# Patient Record
Sex: Female | Born: 1940
Health system: Southern US, Community
[De-identification: ages and names within clinical notes are randomized; demographics above are authoritative.]

## PROBLEM LIST (undated history)

## (undated) DIAGNOSIS — Z9889 Other specified postprocedural states: Secondary | ICD-10-CM

## (undated) DIAGNOSIS — M79609 Pain in unspecified limb: Secondary | ICD-10-CM

## (undated) DIAGNOSIS — R509 Fever, unspecified: Secondary | ICD-10-CM

## (undated) DIAGNOSIS — Z9089 Acquired absence of other organs: Secondary | ICD-10-CM

## (undated) DIAGNOSIS — M1990 Unspecified osteoarthritis, unspecified site: Secondary | ICD-10-CM

## (undated) DIAGNOSIS — R209 Unspecified disturbances of skin sensation: Secondary | ICD-10-CM

## (undated) DIAGNOSIS — N390 Urinary tract infection, site not specified: Secondary | ICD-10-CM

## (undated) DIAGNOSIS — R112 Nausea with vomiting, unspecified: Secondary | ICD-10-CM

## (undated) DIAGNOSIS — R0600 Dyspnea, unspecified: Secondary | ICD-10-CM

## (undated) DIAGNOSIS — R32 Unspecified urinary incontinence: Secondary | ICD-10-CM

## (undated) DIAGNOSIS — R002 Palpitations: Secondary | ICD-10-CM

## (undated) DIAGNOSIS — F419 Anxiety disorder, unspecified: Secondary | ICD-10-CM

## (undated) DIAGNOSIS — J302 Other seasonal allergic rhinitis: Secondary | ICD-10-CM

## (undated) DIAGNOSIS — M4120 Other idiopathic scoliosis, site unspecified: Secondary | ICD-10-CM

## (undated) DIAGNOSIS — T8859XA Other complications of anesthesia, initial encounter: Secondary | ICD-10-CM

## (undated) DIAGNOSIS — Z860100 Personal history of colon polyps, unspecified: Secondary | ICD-10-CM

## (undated) DIAGNOSIS — R109 Unspecified abdominal pain: Secondary | ICD-10-CM

## (undated) DIAGNOSIS — T50995A Adverse effect of other drugs, medicaments and biological substances, initial encounter: Secondary | ICD-10-CM

## (undated) DIAGNOSIS — Z87442 Personal history of urinary calculi: Secondary | ICD-10-CM

## (undated) DIAGNOSIS — K5730 Diverticulosis of large intestine without perforation or abscess without bleeding: Secondary | ICD-10-CM

## (undated) DIAGNOSIS — E213 Hyperparathyroidism, unspecified: Secondary | ICD-10-CM

## (undated) DIAGNOSIS — N189 Chronic kidney disease, unspecified: Secondary | ICD-10-CM

## (undated) DIAGNOSIS — E785 Hyperlipidemia, unspecified: Secondary | ICD-10-CM

## (undated) DIAGNOSIS — I10 Essential (primary) hypertension: Secondary | ICD-10-CM

## (undated) DIAGNOSIS — R5381 Other malaise: Secondary | ICD-10-CM

## (undated) DIAGNOSIS — K589 Irritable bowel syndrome without diarrhea: Secondary | ICD-10-CM

## (undated) DIAGNOSIS — M810 Age-related osteoporosis without current pathological fracture: Secondary | ICD-10-CM

## (undated) DIAGNOSIS — Z803 Family history of malignant neoplasm of breast: Secondary | ICD-10-CM

## (undated) DIAGNOSIS — D649 Anemia, unspecified: Secondary | ICD-10-CM

## (undated) DIAGNOSIS — Z833 Family history of diabetes mellitus: Secondary | ICD-10-CM

## (undated) DIAGNOSIS — F0390 Unspecified dementia without behavioral disturbance: Secondary | ICD-10-CM

## (undated) DIAGNOSIS — R011 Cardiac murmur, unspecified: Secondary | ICD-10-CM

## (undated) DIAGNOSIS — K5900 Constipation, unspecified: Secondary | ICD-10-CM

## (undated) DIAGNOSIS — R079 Chest pain, unspecified: Secondary | ICD-10-CM

## (undated) DIAGNOSIS — R5383 Other fatigue: Secondary | ICD-10-CM

## (undated) HISTORY — PX: CATARACT EXTRACTION, BILATERAL: SHX1313

## (undated) HISTORY — DX: Chest pain, unspecified: R07.9

## (undated) HISTORY — PX: TONSILLECTOMY: SUR1361

## (undated) HISTORY — DX: Hyperlipidemia, unspecified: E78.5

## (undated) HISTORY — PX: POLYPECTOMY: SHX149

## (undated) HISTORY — DX: Fever, unspecified: R50.9

## (undated) HISTORY — DX: Other fatigue: R53.83

## (undated) HISTORY — DX: Family history of diabetes mellitus: Z83.3

## (undated) HISTORY — DX: Other idiopathic scoliosis, site unspecified: M41.20

## (undated) HISTORY — DX: Unspecified abdominal pain: R10.9

## (undated) HISTORY — DX: Personal history of colonic polyps: Z86.010

## (undated) HISTORY — PX: BUNIONECTOMY: SHX129

## (undated) HISTORY — PX: COLONOSCOPY: SHX174

## (undated) HISTORY — DX: Adverse effect of other drugs, medicaments and biological substances, initial encounter: T50.995A

## (undated) HISTORY — DX: Other malaise: R53.81

## (undated) HISTORY — DX: Family history of malignant neoplasm of breast: Z80.3

## (undated) HISTORY — DX: Age-related osteoporosis without current pathological fracture: M81.0

## (undated) HISTORY — DX: Unspecified urinary incontinence: R32

## (undated) HISTORY — DX: Irritable bowel syndrome, unspecified: K58.9

## (undated) HISTORY — DX: Unspecified disturbances of skin sensation: R20.9

## (undated) HISTORY — DX: Diverticulosis of large intestine without perforation or abscess without bleeding: K57.30

## (undated) HISTORY — DX: Hyperparathyroidism, unspecified: E21.3

## (undated) HISTORY — DX: Essential (primary) hypertension: I10

## (undated) HISTORY — DX: Constipation, unspecified: K59.00

## (undated) HISTORY — DX: Palpitations: R00.2

## (undated) HISTORY — PX: RIGHT OOPHORECTOMY: SHX2359

## (undated) HISTORY — DX: Pain in unspecified limb: M79.609

## (undated) HISTORY — DX: Urinary tract infection, site not specified: N39.0

## (undated) HISTORY — PX: PARATHYROIDECTOMY: SHX19

## (undated) HISTORY — DX: Personal history of urinary calculi: Z87.442

## (undated) HISTORY — DX: Personal history of colon polyps, unspecified: Z86.0100

## (undated) HISTORY — DX: Acquired absence of other organs: Z90.89

## (undated) HISTORY — DX: Other seasonal allergic rhinitis: J30.2

## (undated) HISTORY — DX: Anemia, unspecified: D64.9

---
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
## 1996-07-14 ENCOUNTER — Encounter: Payer: Self-pay | Admitting: Internal Medicine

## 1998-10-30 ENCOUNTER — Other Ambulatory Visit: Admission: RE | Admit: 1998-10-30 | Discharge: 1998-10-30 | Payer: Self-pay | Admitting: *Deleted

## 1999-12-03 ENCOUNTER — Other Ambulatory Visit: Admission: RE | Admit: 1999-12-03 | Discharge: 1999-12-03 | Payer: Self-pay | Admitting: *Deleted

## 2000-12-10 ENCOUNTER — Other Ambulatory Visit: Admission: RE | Admit: 2000-12-10 | Discharge: 2000-12-10 | Payer: Self-pay | Admitting: *Deleted

## 2001-12-03 ENCOUNTER — Other Ambulatory Visit: Admission: RE | Admit: 2001-12-03 | Discharge: 2001-12-03 | Payer: Self-pay | Admitting: *Deleted

## 2003-01-28 ENCOUNTER — Other Ambulatory Visit: Admission: RE | Admit: 2003-01-28 | Discharge: 2003-01-28 | Payer: Self-pay | Admitting: *Deleted

## 2003-06-17 ENCOUNTER — Ambulatory Visit (HOSPITAL_COMMUNITY): Admission: RE | Admit: 2003-06-17 | Discharge: 2003-06-17 | Payer: Self-pay | Admitting: Gastroenterology

## 2003-06-17 ENCOUNTER — Encounter (INDEPENDENT_AMBULATORY_CARE_PROVIDER_SITE_OTHER): Payer: Self-pay | Admitting: *Deleted

## 2003-06-17 ENCOUNTER — Encounter (INDEPENDENT_AMBULATORY_CARE_PROVIDER_SITE_OTHER): Payer: Self-pay | Admitting: Specialist

## 2004-02-08 ENCOUNTER — Other Ambulatory Visit: Admission: RE | Admit: 2004-02-08 | Discharge: 2004-02-08 | Payer: Self-pay | Admitting: *Deleted

## 2005-02-19 ENCOUNTER — Encounter: Admission: RE | Admit: 2005-02-19 | Discharge: 2005-02-19 | Payer: Self-pay | Admitting: Internal Medicine

## 2005-04-05 ENCOUNTER — Encounter: Admission: RE | Admit: 2005-04-05 | Discharge: 2005-04-05 | Payer: Self-pay | Admitting: Internal Medicine

## 2005-04-29 ENCOUNTER — Encounter: Admission: RE | Admit: 2005-04-29 | Discharge: 2005-05-17 | Payer: Self-pay | Admitting: Internal Medicine

## 2006-02-18 ENCOUNTER — Ambulatory Visit: Payer: Self-pay | Admitting: Internal Medicine

## 2006-02-25 ENCOUNTER — Ambulatory Visit: Payer: Self-pay | Admitting: Internal Medicine

## 2006-03-10 ENCOUNTER — Ambulatory Visit: Payer: Self-pay | Admitting: Internal Medicine

## 2006-05-19 ENCOUNTER — Encounter: Payer: Self-pay | Admitting: Internal Medicine

## 2006-05-20 ENCOUNTER — Ambulatory Visit: Payer: Self-pay | Admitting: Internal Medicine

## 2006-05-26 ENCOUNTER — Encounter: Payer: Self-pay | Admitting: Internal Medicine

## 2006-05-27 ENCOUNTER — Ambulatory Visit: Payer: Self-pay | Admitting: Internal Medicine

## 2006-06-04 ENCOUNTER — Encounter: Payer: Self-pay | Admitting: Cardiovascular Disease

## 2006-06-04 ENCOUNTER — Ambulatory Visit: Payer: Self-pay

## 2006-06-04 ENCOUNTER — Encounter: Payer: Self-pay | Admitting: Internal Medicine

## 2006-06-16 ENCOUNTER — Ambulatory Visit: Payer: Self-pay | Admitting: Internal Medicine

## 2006-09-17 ENCOUNTER — Ambulatory Visit: Payer: Self-pay | Admitting: Internal Medicine

## 2006-09-26 ENCOUNTER — Ambulatory Visit: Payer: Self-pay | Admitting: Internal Medicine

## 2006-11-12 ENCOUNTER — Encounter: Payer: Self-pay | Admitting: Internal Medicine

## 2006-11-14 ENCOUNTER — Ambulatory Visit: Payer: Self-pay | Admitting: Internal Medicine

## 2007-01-06 ENCOUNTER — Encounter: Admission: RE | Admit: 2007-01-06 | Discharge: 2007-01-06 | Payer: Self-pay | Admitting: Surgery

## 2007-01-15 ENCOUNTER — Encounter: Admission: RE | Admit: 2007-01-15 | Discharge: 2007-01-15 | Payer: Self-pay | Admitting: Surgery

## 2007-02-27 ENCOUNTER — Ambulatory Visit: Payer: Self-pay | Admitting: Internal Medicine

## 2007-06-22 ENCOUNTER — Telehealth: Payer: Self-pay | Admitting: Internal Medicine

## 2007-07-07 DIAGNOSIS — R32 Unspecified urinary incontinence: Secondary | ICD-10-CM

## 2007-07-07 DIAGNOSIS — E785 Hyperlipidemia, unspecified: Secondary | ICD-10-CM | POA: Insufficient documentation

## 2007-07-07 DIAGNOSIS — D649 Anemia, unspecified: Secondary | ICD-10-CM

## 2007-07-07 HISTORY — DX: Hyperlipidemia, unspecified: E78.5

## 2007-07-07 HISTORY — DX: Anemia, unspecified: D64.9

## 2007-07-07 HISTORY — DX: Unspecified urinary incontinence: R32

## 2007-08-04 ENCOUNTER — Encounter: Payer: Self-pay | Admitting: Internal Medicine

## 2007-09-29 ENCOUNTER — Encounter (INDEPENDENT_AMBULATORY_CARE_PROVIDER_SITE_OTHER): Payer: Self-pay | Admitting: Surgery

## 2007-09-29 ENCOUNTER — Ambulatory Visit (HOSPITAL_COMMUNITY): Admission: RE | Admit: 2007-09-29 | Discharge: 2007-09-30 | Payer: Self-pay | Admitting: Surgery

## 2007-10-20 ENCOUNTER — Encounter: Payer: Self-pay | Admitting: Internal Medicine

## 2007-10-20 DIAGNOSIS — E213 Hyperparathyroidism, unspecified: Secondary | ICD-10-CM

## 2007-10-20 HISTORY — DX: Hyperparathyroidism, unspecified: E21.3

## 2007-10-23 ENCOUNTER — Encounter: Payer: Self-pay | Admitting: Internal Medicine

## 2007-12-01 ENCOUNTER — Encounter: Payer: Self-pay | Admitting: Internal Medicine

## 2007-12-04 ENCOUNTER — Ambulatory Visit: Payer: Self-pay | Admitting: Family Medicine

## 2007-12-04 DIAGNOSIS — M546 Pain in thoracic spine: Secondary | ICD-10-CM | POA: Insufficient documentation

## 2007-12-04 DIAGNOSIS — R079 Chest pain, unspecified: Secondary | ICD-10-CM | POA: Insufficient documentation

## 2007-12-25 ENCOUNTER — Encounter: Payer: Self-pay | Admitting: Internal Medicine

## 2007-12-25 LAB — HM COLONOSCOPY

## 2008-01-01 ENCOUNTER — Ambulatory Visit: Payer: Self-pay | Admitting: Internal Medicine

## 2008-01-01 DIAGNOSIS — M79609 Pain in unspecified limb: Secondary | ICD-10-CM | POA: Insufficient documentation

## 2008-01-01 DIAGNOSIS — K5900 Constipation, unspecified: Secondary | ICD-10-CM | POA: Insufficient documentation

## 2008-01-01 DIAGNOSIS — M810 Age-related osteoporosis without current pathological fracture: Secondary | ICD-10-CM

## 2008-01-01 HISTORY — DX: Age-related osteoporosis without current pathological fracture: M81.0

## 2008-01-06 ENCOUNTER — Encounter: Payer: Self-pay | Admitting: Internal Medicine

## 2008-01-19 ENCOUNTER — Telehealth: Payer: Self-pay | Admitting: Internal Medicine

## 2008-01-20 ENCOUNTER — Telehealth: Payer: Self-pay | Admitting: Internal Medicine

## 2008-02-04 ENCOUNTER — Ambulatory Visit: Payer: Self-pay | Admitting: Internal Medicine

## 2008-02-04 DIAGNOSIS — I10 Essential (primary) hypertension: Secondary | ICD-10-CM

## 2008-02-04 HISTORY — DX: Essential (primary) hypertension: I10

## 2008-02-08 ENCOUNTER — Encounter: Payer: Self-pay | Admitting: Internal Medicine

## 2008-02-10 ENCOUNTER — Ambulatory Visit: Payer: Self-pay | Admitting: Internal Medicine

## 2008-04-20 ENCOUNTER — Encounter: Payer: Self-pay | Admitting: Internal Medicine

## 2008-05-09 ENCOUNTER — Telehealth: Payer: Self-pay | Admitting: Internal Medicine

## 2008-07-12 ENCOUNTER — Ambulatory Visit: Payer: Self-pay | Admitting: Internal Medicine

## 2008-07-12 DIAGNOSIS — R002 Palpitations: Secondary | ICD-10-CM

## 2008-07-12 HISTORY — DX: Palpitations: R00.2

## 2008-07-15 ENCOUNTER — Telehealth: Payer: Self-pay | Admitting: *Deleted

## 2008-08-19 ENCOUNTER — Ambulatory Visit: Payer: Self-pay | Admitting: Internal Medicine

## 2008-09-02 ENCOUNTER — Telehealth: Payer: Self-pay | Admitting: Internal Medicine

## 2008-11-15 ENCOUNTER — Ambulatory Visit: Payer: Self-pay | Admitting: Internal Medicine

## 2008-11-15 DIAGNOSIS — Z87442 Personal history of urinary calculi: Secondary | ICD-10-CM

## 2008-11-15 HISTORY — DX: Personal history of urinary calculi: Z87.442

## 2008-11-15 LAB — CONVERTED CEMR LAB
Bilirubin Urine: NEGATIVE
Glucose, Urine, Semiquant: NEGATIVE
Ketones, urine, test strip: NEGATIVE
Nitrite: POSITIVE
Specific Gravity, Urine: 1.015
Urobilinogen, UA: 0.2
pH: 6

## 2008-11-17 ENCOUNTER — Telehealth: Payer: Self-pay | Admitting: Internal Medicine

## 2009-01-02 ENCOUNTER — Ambulatory Visit: Payer: Self-pay | Admitting: Internal Medicine

## 2009-01-02 DIAGNOSIS — Z9089 Acquired absence of other organs: Secondary | ICD-10-CM

## 2009-01-02 HISTORY — DX: Acquired absence of other organs: Z90.89

## 2009-01-03 ENCOUNTER — Encounter: Payer: Self-pay | Admitting: Internal Medicine

## 2009-01-03 LAB — CONVERTED CEMR LAB
Calcium, Total (PTH): 9.4 mg/dL (ref 8.4–10.5)
PTH: 75.6 pg/mL — ABNORMAL HIGH (ref 14.0–72.0)
Vit D, 25-Hydroxy: 44 ng/mL (ref 30–89)

## 2009-01-06 ENCOUNTER — Telehealth: Payer: Self-pay | Admitting: Internal Medicine

## 2009-02-23 DIAGNOSIS — Z860100 Personal history of colon polyps, unspecified: Secondary | ICD-10-CM | POA: Insufficient documentation

## 2009-02-23 DIAGNOSIS — K5730 Diverticulosis of large intestine without perforation or abscess without bleeding: Secondary | ICD-10-CM | POA: Insufficient documentation

## 2009-02-23 HISTORY — DX: Diverticulosis of large intestine without perforation or abscess without bleeding: K57.30

## 2009-02-27 ENCOUNTER — Ambulatory Visit: Payer: Self-pay | Admitting: Internal Medicine

## 2009-03-02 ENCOUNTER — Encounter: Payer: Self-pay | Admitting: Internal Medicine

## 2009-04-19 ENCOUNTER — Telehealth: Payer: Self-pay | Admitting: Internal Medicine

## 2009-04-27 ENCOUNTER — Encounter: Payer: Self-pay | Admitting: Internal Medicine

## 2009-05-25 ENCOUNTER — Ambulatory Visit: Payer: Self-pay | Admitting: Internal Medicine

## 2009-05-25 DIAGNOSIS — R5383 Other fatigue: Secondary | ICD-10-CM | POA: Insufficient documentation

## 2009-05-25 DIAGNOSIS — M4120 Other idiopathic scoliosis, site unspecified: Secondary | ICD-10-CM | POA: Insufficient documentation

## 2009-05-25 DIAGNOSIS — R5381 Other malaise: Secondary | ICD-10-CM

## 2009-05-25 HISTORY — DX: Other malaise: R53.81

## 2009-05-25 HISTORY — DX: Other idiopathic scoliosis, site unspecified: M41.20

## 2009-05-25 HISTORY — DX: Other fatigue: R53.83

## 2009-06-01 ENCOUNTER — Telehealth: Payer: Self-pay | Admitting: *Deleted

## 2009-07-31 ENCOUNTER — Ambulatory Visit: Payer: Self-pay | Admitting: Internal Medicine

## 2009-07-31 DIAGNOSIS — T50995A Adverse effect of other drugs, medicaments and biological substances, initial encounter: Secondary | ICD-10-CM | POA: Insufficient documentation

## 2009-07-31 HISTORY — DX: Adverse effect of other drugs, medicaments and biological substances, initial encounter: T50.995A

## 2009-10-05 ENCOUNTER — Ambulatory Visit: Payer: Self-pay | Admitting: Family Medicine

## 2009-10-05 ENCOUNTER — Telehealth: Payer: Self-pay | Admitting: Internal Medicine

## 2009-10-16 ENCOUNTER — Ambulatory Visit: Payer: Self-pay | Admitting: Internal Medicine

## 2009-10-16 LAB — CONVERTED CEMR LAB
Bilirubin Urine: NEGATIVE
Glucose, Urine, Semiquant: NEGATIVE
Ketones, urine, test strip: NEGATIVE
Nitrite: NEGATIVE
Protein, U semiquant: NEGATIVE
Specific Gravity, Urine: 1.01
Urobilinogen, UA: 0.2
pH: 7

## 2009-10-17 ENCOUNTER — Encounter: Payer: Self-pay | Admitting: Internal Medicine

## 2009-11-20 ENCOUNTER — Ambulatory Visit: Payer: Self-pay | Admitting: Internal Medicine

## 2009-11-20 DIAGNOSIS — N390 Urinary tract infection, site not specified: Secondary | ICD-10-CM

## 2009-11-20 HISTORY — DX: Urinary tract infection, site not specified: N39.0

## 2009-11-20 LAB — CONVERTED CEMR LAB
Bilirubin Urine: NEGATIVE
Blood in Urine, dipstick: NEGATIVE
Glucose, Urine, Semiquant: NEGATIVE
Ketones, urine, test strip: NEGATIVE
Nitrite: NEGATIVE
Protein, U semiquant: NEGATIVE
Specific Gravity, Urine: 1.015
Urobilinogen, UA: 0.2
pH: 7

## 2010-01-02 ENCOUNTER — Telehealth: Payer: Self-pay | Admitting: *Deleted

## 2010-01-28 ENCOUNTER — Emergency Department (HOSPITAL_BASED_OUTPATIENT_CLINIC_OR_DEPARTMENT_OTHER): Admission: EM | Admit: 2010-01-28 | Discharge: 2010-01-28 | Payer: Self-pay | Admitting: Emergency Medicine

## 2010-01-28 ENCOUNTER — Ambulatory Visit: Payer: Self-pay | Admitting: Diagnostic Radiology

## 2010-01-28 ENCOUNTER — Telehealth: Payer: Self-pay | Admitting: Internal Medicine

## 2010-01-29 ENCOUNTER — Telehealth: Payer: Self-pay | Admitting: *Deleted

## 2010-02-01 ENCOUNTER — Encounter: Payer: Self-pay | Admitting: Internal Medicine

## 2010-02-21 ENCOUNTER — Encounter: Payer: Self-pay | Admitting: Internal Medicine

## 2010-02-22 ENCOUNTER — Telehealth: Payer: Self-pay | Admitting: *Deleted

## 2010-04-30 ENCOUNTER — Telehealth: Payer: Self-pay | Admitting: *Deleted

## 2010-05-31 ENCOUNTER — Encounter: Payer: Self-pay | Admitting: Internal Medicine

## 2010-06-12 ENCOUNTER — Telehealth: Payer: Self-pay | Admitting: *Deleted

## 2010-08-30 ENCOUNTER — Telehealth: Payer: Self-pay | Admitting: Internal Medicine

## 2010-09-03 ENCOUNTER — Encounter: Payer: Self-pay | Admitting: Internal Medicine

## 2010-09-07 ENCOUNTER — Ambulatory Visit: Payer: Self-pay | Admitting: Internal Medicine

## 2010-09-07 LAB — CONVERTED CEMR LAB
BUN: 16 mg/dL (ref 6–23)
Basophils Absolute: 0 10*3/uL (ref 0.0–0.1)
Basophils Relative: 0.9 % (ref 0.0–3.0)
CO2: 30 meq/L (ref 19–32)
Calcium, Total (PTH): 9.2 mg/dL (ref 8.4–10.5)
Calcium: 9.2 mg/dL (ref 8.4–10.5)
Chloride: 105 meq/L (ref 96–112)
Cholesterol: 245 mg/dL — ABNORMAL HIGH (ref 0–200)
Creatinine, Ser: 0.8 mg/dL (ref 0.4–1.2)
Direct LDL: 156.7 mg/dL
Eosinophils Absolute: 0.1 10*3/uL (ref 0.0–0.7)
Eosinophils Relative: 2.4 % (ref 0.0–5.0)
GFR calc non Af Amer: 75.26 mL/min (ref >60)
Glucose, Bld: 88 mg/dL (ref 70–99)
HCT: 42 % (ref 36.0–46.0)
HDL: 62 mg/dL (ref >39.00)
Hemoglobin: 14.4 g/dL (ref 12.0–15.0)
Lymphocytes Relative: 26.8 % (ref 12.0–46.0)
Lymphs Abs: 1.2 10*3/uL (ref 0.7–4.0)
MCHC: 34.3 g/dL (ref 30.0–36.0)
MCV: 94.3 fL (ref 78.0–100.0)
Monocytes Absolute: 0.6 10*3/uL (ref 0.1–1.0)
Monocytes Relative: 12.5 % — ABNORMAL HIGH (ref 3.0–12.0)
Neutro Abs: 2.6 10*3/uL (ref 1.4–7.7)
Neutrophils Relative %: 57.4 % (ref 43.0–77.0)
PTH: 77.3 pg/mL — ABNORMAL HIGH (ref 14.0–72.0)
Platelets: 227 10*3/uL (ref 150.0–400.0)
Potassium: 4.5 meq/L (ref 3.5–5.1)
RBC: 4.45 M/uL (ref 3.87–5.11)
RDW: 13.7 % (ref 11.5–14.6)
Sodium: 141 meq/L (ref 135–145)
TSH: 1.2 microintl units/mL (ref 0.35–5.50)
Total CHOL/HDL Ratio: 4
Triglycerides: 78 mg/dL (ref 0.0–149.0)
VLDL: 15.6 mg/dL (ref 0.0–40.0)
WBC: 4.5 10*3/uL (ref 4.5–10.5)

## 2010-09-17 ENCOUNTER — Ambulatory Visit: Payer: Self-pay | Admitting: Internal Medicine

## 2010-10-31 ENCOUNTER — Ambulatory Visit: Payer: Self-pay | Admitting: Internal Medicine

## 2010-10-31 DIAGNOSIS — J069 Acute upper respiratory infection, unspecified: Secondary | ICD-10-CM | POA: Insufficient documentation

## 2010-10-31 DIAGNOSIS — J019 Acute sinusitis, unspecified: Secondary | ICD-10-CM | POA: Insufficient documentation

## 2010-10-31 LAB — CONVERTED CEMR LAB
Cholesterol, target level: 200 mg/dL
HDL goal, serum: 40 mg/dL
LDL Goal: 160 mg/dL

## 2010-12-16 LAB — CONVERTED CEMR LAB
ALT: 14 units/L (ref 0–35)
ALT: 15 units/L (ref 0–35)
AST: 20 units/L (ref 0–37)
AST: 20 units/L (ref 0–37)
Albumin: 3.8 g/dL (ref 3.5–5.2)
Albumin: 4.1 g/dL (ref 3.5–5.2)
Alkaline Phosphatase: 50 units/L (ref 39–117)
Alkaline Phosphatase: 57 units/L (ref 39–117)
BUN: 11 mg/dL (ref 6–23)
BUN: 13 mg/dL (ref 6–23)
BUN: 17 mg/dL (ref 6–23)
Basophils Absolute: 0 10*3/uL (ref 0.0–0.1)
Basophils Absolute: 0.1 10*3/uL (ref 0.0–0.1)
Basophils Relative: 0.3 % (ref 0.0–3.0)
Basophils Relative: 1.3 % — ABNORMAL HIGH (ref 0.0–1.0)
Bilirubin, Direct: 0 mg/dL (ref 0.0–0.3)
Bilirubin, Direct: 0.2 mg/dL (ref 0.0–0.3)
CO2: 27 meq/L (ref 19–32)
CO2: 28 meq/L (ref 19–32)
CO2: 30 meq/L (ref 19–32)
Calcium, Total (PTH): 9.1 mg/dL (ref 8.4–10.5)
Calcium, Total (PTH): 9.4 mg/dL (ref 8.4–10.5)
Calcium, Total (PTH): 9.9 mg/dL (ref 8.4–10.5)
Calcium: 8.9 mg/dL (ref 8.4–10.5)
Calcium: 9 mg/dL (ref 8.4–10.5)
Calcium: 9.3 mg/dL (ref 8.4–10.5)
Chloride: 103 meq/L (ref 96–112)
Chloride: 104 meq/L (ref 96–112)
Chloride: 109 meq/L (ref 96–112)
Cholesterol: 220 mg/dL (ref 0–200)
Cholesterol: 245 mg/dL — ABNORMAL HIGH (ref 0–200)
Creatinine, Ser: 0.6 mg/dL (ref 0.4–1.2)
Creatinine, Ser: 0.7 mg/dL (ref 0.4–1.2)
Creatinine, Ser: 0.7 mg/dL (ref 0.4–1.2)
Direct LDL: 150.6 mg/dL
Direct LDL: 169.1 mg/dL
Eosinophils Absolute: 0.1 10*3/uL (ref 0.0–0.6)
Eosinophils Absolute: 0.1 10*3/uL (ref 0.0–0.7)
Eosinophils Relative: 1.9 % (ref 0.0–5.0)
Eosinophils Relative: 2.5 % (ref 0.0–5.0)
Folate: 8 ng/mL
Free T4: 0.8 ng/dL (ref 0.6–1.6)
Free T4: 1 ng/dL (ref 0.6–1.6)
GFR calc Af Amer: 107 mL/min
GFR calc Af Amer: 128 mL/min
GFR calc non Af Amer: 106 mL/min
GFR calc non Af Amer: 88 mL/min
GFR calc non Af Amer: 88.13 mL/min (ref >60)
Glucose, Bld: 80 mg/dL (ref 70–99)
Glucose, Bld: 87 mg/dL (ref 70–99)
Glucose, Bld: 94 mg/dL (ref 70–99)
HCT: 41.6 % (ref 36.0–46.0)
HCT: 42.7 % (ref 36.0–46.0)
HDL: 47.9 mg/dL (ref >39.0)
HDL: 55.6 mg/dL (ref >39.00)
Hemoglobin: 14.2 g/dL (ref 12.0–15.0)
Hemoglobin: 14.5 g/dL (ref 12.0–15.0)
Lymphocytes Relative: 30.8 % (ref 12.0–46.0)
Lymphocytes Relative: 31.7 % (ref 12.0–46.0)
Lymphs Abs: 1.6 10*3/uL (ref 0.7–4.0)
MCHC: 33.2 g/dL (ref 30.0–36.0)
MCHC: 35 g/dL (ref 30.0–36.0)
MCV: 94.2 fL (ref 78.0–100.0)
MCV: 94.6 fL (ref 78.0–100.0)
Monocytes Absolute: 0.5 10*3/uL (ref 0.1–1.0)
Monocytes Absolute: 0.5 10*3/uL (ref 0.2–0.7)
Monocytes Relative: 11.6 % — ABNORMAL HIGH (ref 3.0–11.0)
Monocytes Relative: 9.5 % (ref 3.0–12.0)
Neutro Abs: 2.3 10*3/uL (ref 1.4–7.7)
Neutro Abs: 2.7 10*3/uL (ref 1.4–7.7)
Neutrophils Relative %: 53.8 % (ref 43.0–77.0)
Neutrophils Relative %: 56.6 % (ref 43.0–77.0)
PTH: 173.4 pg/mL — ABNORMAL HIGH (ref 14.0–72.0)
PTH: 79.1 pg/mL — ABNORMAL HIGH (ref 14.0–72.0)
PTH: 95.3 pg/mL — ABNORMAL HIGH (ref 14.0–72.0)
Platelets: 221 10*3/uL (ref 150.0–400.0)
Platelets: 226 10*3/uL (ref 150–400)
Potassium: 3.9 meq/L (ref 3.5–5.1)
Potassium: 3.9 meq/L (ref 3.5–5.1)
Potassium: 4.2 meq/L (ref 3.5–5.1)
RBC: 4.42 M/uL (ref 3.87–5.11)
RBC: 4.51 M/uL (ref 3.87–5.11)
RDW: 12.1 % (ref 11.5–14.6)
RDW: 12.8 % (ref 11.5–14.6)
Sed Rate: 10 mm/hr (ref 0–22)
Sodium: 141 meq/L (ref 135–145)
Sodium: 141 meq/L (ref 135–145)
Sodium: 142 meq/L (ref 135–145)
T3, Free: 2.6 pg/mL (ref 2.3–4.2)
TSH: 1.17 microintl units/mL (ref 0.35–5.50)
TSH: 1.28 microintl units/mL (ref 0.35–5.50)
TSH: 1.69 microintl units/mL (ref 0.35–5.50)
Total Bilirubin: 1.3 mg/dL — ABNORMAL HIGH (ref 0.3–1.2)
Total Bilirubin: 1.4 mg/dL — ABNORMAL HIGH (ref 0.3–1.2)
Total CHOL/HDL Ratio: 4
Total CHOL/HDL Ratio: 4.6
Total Protein: 6.3 g/dL (ref 6.0–8.3)
Total Protein: 7 g/dL (ref 6.0–8.3)
Triglycerides: 82 mg/dL (ref 0–149)
Triglycerides: 84 mg/dL (ref 0.0–149.0)
VLDL: 16 mg/dL (ref 0–40)
VLDL: 16.8 mg/dL (ref 0.0–40.0)
Vit D, 1,25-Dihydroxy: 30 (ref 30–89)
Vitamin B-12: 362 pg/mL (ref 211–911)
WBC: 4.3 10*3/uL — ABNORMAL LOW (ref 4.5–10.5)
WBC: 4.9 10*3/uL (ref 4.5–10.5)

## 2010-12-17 ENCOUNTER — Telehealth: Payer: Self-pay | Admitting: Internal Medicine

## 2010-12-20 NOTE — Letter (Signed)
Summary: [facility]  [facility]   Imported By: Maryln Gottron 03/07/2009 12:36:54  _____________________________________________________________________  External Attachment:    Type:   Image     Comment:   External Document

## 2010-12-20 NOTE — Progress Notes (Signed)
Summary: ?lab first  Phone Note Call from Patient Call back at Home Phone [phone]   Caller: Patient Call For: Madelin Headings MD Summary of Call: Pt has med ov sch for 09/14/10. Pt is wondering should she have labs first. Initial call taken by: Heron Sabins,  August 30, 2010 12:38 PM  Follow-up for Phone Call        ok to get labs  before visit with the following dx.  BMP, Intact PTH,   cbcdiff , lfts , lipids, tsh.   (dx hyperlipidemia hypertension,  anemia and arthritis) Follow-up by: Madelin Headings MD,  September 03, 2010 5:29 PM  Additional Follow-up for Phone Call Additional follow up Details #1::        lab sch for 09-07-2010 Additional Follow-up by: Heron Sabins,  September 04, 2010 11:46 AM

## 2010-12-20 NOTE — Assessment & Plan Note (Signed)
Summary: med check and refill/cjr   Vital Signs:  Patient profile:   70 year old female Menstrual status:  postmenopausal Height:      62 inches Weight:      129 pounds BMI:     23.68 Pulse rate:   72 / minute BP sitting:   130 / 80  (left arm) Cuff size:   regular  Vitals Entered By: Romualdo Bolk, CMA (AAMA) (September 17, 2010 2:53 PM)  Serial Vital Signs/Assessments:  Time      Position  BP       Pulse  Resp  Temp     By                     158/78                         Madelin Headings MD  CC: Follow-up visit on meds and labs- Pt states that she d/c all meds more than 1 month ago., Hypertension Management   History of Present Illness: [name] comes in today  for  follow up of multiple medical problems . She decided to stop all meds except th valium about 2 months ago feeling that she didnt need them and not that helpful .  even stopped th calcium and vitamin d Great stress at home  still taking valium  ocassionaly  and seeing a Veterinary surgeon. This is helping out.   BP readings are per her home monitor   145/ 130  range.     Checking readings  weekly .    Uti    No recurrance    UI is better some  patch not that helpful  . Took the cipro for  poss ecoli on a travel trip where others got sick but she didnt.  NO recent stones.   Hypertension History:      She complains of palpitations, but denies headache, chest pain, dyspnea with exertion, orthopnea, PND, peripheral edema, visual symptoms, neurologic problems, syncope, and side effects from treatment.  She notes no problems with any antihypertensive medication side effects.        Positive major cardiovascular risk factors include female age 47 years old or older, hyperlipidemia, and hypertension.  Negative major cardiovascular risk factors include non-tobacco-user status.     Preventive Screening-Counseling & Management  Alcohol-Tobacco     Alcohol drinks/day: 1     Alcohol type: wine     Smoking Status:  never  Caffeine-Diet-Exercise     Caffeine use/day: 2-3     Does Patient Exercise: no  Current Medications (verified): 1)  D 1000 Plus  Tabs (Fa-Cyanocobalamin-B6-D-Ca) .... 2 Tablets By Mouth Once Daily 2)  Oxytrol 3.9 Mg/24hr Pttw (Oxybutynin) .[name].. 1 Patch Weekly 3)  Valium 2 Mg Tabs (Diazepam) .... 1/2 - 1 By Mouth Two Times A Day 4)  Flax   Oil (Flaxseed (Linseed)) 5)  Losartan Potassium 100 Mg Tabs (Losartan Potassium) .... One By Mouth Once Daily 6)  Amlodipine Besylate 5 Mg Tabs (Amlodipine Besylate) .[name].. 1 By Mouth Once Daily  Allergies (verified): 1)  Lisinopril (Lisinopril)  Past History:  Past medical, surgical, family and social histories (including risk factors) reviewed, and no changes noted (except as noted below).  Past Medical History: Reviewed history from 05/25/2009 and no changes required. Seasonal Allergies G3P2  vaginal deliveries x 3  Consults Dr. Aldean Ast- urologist    Scoliosis  COLONIC POLYPS, ADENOMATOUS, HX OF (ICD-V12.72)  CONSTIPATION (ICD-564.00) DIVERTICULOSIS, COLON (ICD-562.10) PARATHYROIDECTOMY (ICD-V45.79) UTI (ICD-599.0) FEVER UNSPECIFIED (ICD-780.60) NEPHROLITHIASIS, HX OF (ICD-V13.01)-hx of left not passed ABDOMINAL PAIN, UNSPECIFIED SITE (ICD-789.00) PALPITATIONS, OCCASIONAL (ICD-785.1) HYPERTENSION (ICD-401.9) OSTEOPOROSIS (ICD-733.00) RASH AND OTHER NONSPECIFIC SKIN ERUPTION (ICD-782.1) LEG PAIN, LEFT (ICD-729.5) CONSTIPATION, CHRONIC (ICD-564.09) RIB PAIN, RIGHT SIDED (ICD-786.50) HYPERPARATHYROIDISM UNSPECIFIED (ICD-252.00) FAMILY HISTORY DIABETES 1ST DEGREE RELATIVE (ICD-V18.0) FAMILY HISTORY BREAST CANCER 1ST DEGREE RELATIVE <50 (ICD-V16.3) URINARY INCONTINENCE (ICD-788.30) ANEMIA-NOS (ICD-285.9) HYPERLIPIDEMIA (ICD-272.4) Wrist Fracture  Past Surgical History: Reviewed history from 11/20/2009 and no changes required. Right Oophorectomy Rt Superior Parathyroidectomy    open neck exploration  Past History:  Care  Management: Gastroenterology: Juanda Chance Urology: Kimbrough  Family History: Reviewed history from 02/27/2009 and no changes required. Family History Breast cancer 1st degree relative <50 Family History Diabetes 1st degree relative Family History of Sudden Death father 38  Family History of Cardiovascular disorder MOM RHD 60s  No FH of Colon Cancer:  Social History: Reviewed history from 02/27/2009 and no changes required. Occupation: travel agent Married Never Smoked Alcohol use-yes ocassional wine Drug use-no Regular exercise-no   no pets     hhof 5  kids at home now  Daily Caffeine Use 2-3 cups coffee in AM  Review of Systems  The patient denies anorexia, fever, weight loss, weight gain, vision loss, syncope, dyspnea on exertion, peripheral edema, prolonged cough, hemoptysis, abdominal pain, melena, hematochezia, severe indigestion/heartburn, hematuria, transient blindness, difficulty walking, unusual weight change, abnormal bleeding, enlarged lymph nodes, and angioedema.    Physical Exam  General:  Well-developed,well-nourished,in no acute distress; alert,appropriate and cooperative throughout examination Head:  normocephalic and atraumatic.   Eyes:  vision grossly intact, pupils equal, and pupils round.   Neck:  No deformities, masses, or tenderness noted. Chest Wall:  mild kyphosis Lungs:  Normal respiratory effort, chest expands symmetrically. Lungs are clear to auscultation, no crackles or wheezes. Heart:  Normal rate and regular rhythm. S1 and S2 normal without gallop, murmur, click, rub or other extra sounds. Abdomen:  soft, non-tender, no hepatomegaly, and no splenomegaly.  no bruits  Pulses:  pulses intact without delay   Extremities:  no clubbing cyanosis or edema  Neurologic:  alert & oriented X3 and gait normal.   Skin:  turgor normal, color normal, no ecchymoses, and no petechiae.   Cervical Nodes:  No lymphadenopathy noted Psych:  Oriented X3, memory intact for  recent and remote, normally interactive, good eye contact, and not depressed appearing.  mildly stressed  Labs reviewed  with patient  Impression & Recommendations:  Problem # 1:  HYPERTENSION (ICD-401.9)  my readings are elevated in the office  but during a stressful discussion  she reports better at home .. she has stopped meds on her own but still may benefit  from these...    she should call in her readings  and perhaps go back on amlodipine .   The following medications were removed from the medication list:      Losartan Potassium 100 Mg Tabs (Losartan potassium) ..... One by mouth once daily    Amlodipine Besylate 5 Mg Tabs (Amlodipine besylate) .[name]... 1 by mouth once daily   Prior 10 Yr Risk Heart Disease: Not enough information (11/20/2009)  Labs Reviewed: K+: 4.5 (09/07/2010) Creat: : 0.8 (09/07/2010)   Chol: 245 (09/07/2010)   HDL: 62.00 (09/07/2010)   LDL: DEL (02/04/2008)   TG: 78.0 (09/07/2010)  Problem # 2:  OSTEOPOROSIS (ICD-733.00) at risk     to go back on vitamin D at least  due for dexa  2012 had course of  bisphosphonate  in past   Problem # 3:  HYPERLIPIDEMIA (ICD-272.4)  Labs Reviewed: SGOT: 20 (05/25/2009)   SGPT: 14 (05/25/2009)  Prior 10 Yr Risk Heart Disease: Not enough information (11/20/2009)   HDL:62.00 (09/07/2010), 55.60 (05/25/2009)  LDL:DEL (02/04/2008)  Chol:245 (09/07/2010), 245 (05/25/2009)  Trig:78.0 (09/07/2010), 84.0 (05/25/2009)  Problem # 4:  FAMILY STRESS (ICD-V61.9) Discussed risk benefit  of valium  conisder  change to shorter acting med  if using regularly  Problem # 5:  PARATHYROIDECTOMY (ICD-V45.79) mild elevation today 77  borderline normal   Problem # 6:  URINARY INCONTINENCE (ICD-788.30) went off meds   Complete Medication List: 1)  Valium 2 Mg Tabs (Diazepam) .... 1/2 - 1 by mouth two times a day 2)  Flax Oil (Flaxseed (linseed))  Hypertension Assessment/Plan:      The patient's hypertensive risk group is category B: At  least one risk factor (excluding diabetes) with no target organ damage.  Today's blood pressure is 130/80.  Her blood pressure goal is < 140/90.  Patient Instructions: 1)  take vitamin D  2)  continue to monitor BP readings    3)  Call in 2-3 weeks with readings to ensure   control 4)  if up to high 140 1-50 range call for advice . 5)  Ok to take low dose valium but if using regularly will consider change to shorter acting med for safety reasons described.  6)  return office visit in 6 months .  Prescriptions: VALIUM 2 MG TABS (DIAZEPAM) 1/2 - 1 by mouth two times a day  #20 x 1   Entered and Authorized by:   Madelin Headings MD   Signed by:   Madelin Headings MD on 09/17/2010   Method used:   Print then Give to Patient   RxID:   [id number]    Orders Added: 1)  Est. Patient Level IV [56213]   greater than 50% of visit spent in counseling  

## 2010-12-20 NOTE — Progress Notes (Signed)
Summary: BP still in the 150's- do you want her to wait 1 mo  Phone Note Call from Patient Call back at Home Phone [phone] Call back at [phone]   Reason for Call: Talk to Doctor Summary of Call: Pt states that her BP is never gone below 150 even when waking up in am. Pt is wanting to know if she needs to wait 1 month. Initial call taken by: Romualdo Bolk, CMA,  June 01, 2009 11:19 AM  Follow-up for Phone Call        add lisinopril 10 mg by mouth once daily  disp #30 refill x 1  and continue to monitor Bp  and return office visit  as we discussed . Follow-up by: Madelin Headings MD,  June 01, 2009 11:38 AM  Additional Follow-up for Phone Call Additional follow up Details #1::        Pt aware and rx called in. Additional Follow-up by: Romualdo Bolk, CMA,  June 01, 2009 11:55 AM    New/Updated Medications: LISINOPRIL 10 MG TABS (LISINOPRIL) 1 by mouth once daily Prescriptions: LISINOPRIL 10 MG TABS (LISINOPRIL) 1 by mouth once daily  #30 x 1   Entered by:   Romualdo Bolk, CMA   Authorized by:   Madelin Headings MD   Signed by:   Romualdo Bolk, CMA on 06/01/2009   Method used:   Electronically to        [facility]* (retail)       [address]       [place], [place]  [id number]       Ph: [phone]       Fax: [phone]   RxID:   [id number]

## 2010-12-20 NOTE — Letter (Signed)
Summary: Dr Gerrit Friends note  Dr Gerrit Friends note   Imported By: Kassie Mends 11/13/2007 13:13:32  _____________________________________________________________________  External Attachment:    Type:   Image     Comment:   Dr Gerrit Friends note

## 2010-12-20 NOTE — Progress Notes (Signed)
Summary: BP high-out of meds  Phone Note Call from Patient   Caller: Patient Call For: Madelin Headings MD Summary of Call: out of both BP meds x 2 days. BP on med was 150-160/80 and now is 200/95. Should she get a refill or try something different? [place] phone [phone] Initial call taken by: Raechel Ache, RN,  January 02, 2010 2:30 PM  Follow-up for Phone Call        Per Dr. Fabian Sharp in [place] norvasc to 5 per day and continue the losaatar Pt aware and rx sent to pharmacy for 5mg .  Follow-up by: Romualdo Bolk, CMA (AAMA),  January 02, 2010 5:25 PM    New/Updated Medications: AMLODIPINE BESYLATE 5 MG TABS (AMLODIPINE BESYLATE) 1 by mouth once daily Prescriptions: AMLODIPINE BESYLATE 5 MG TABS (AMLODIPINE BESYLATE) 1 by mouth once daily  #30 x 3   Entered by:   Romualdo Bolk, CMA (AAMA)   Authorized by:   Madelin Headings MD   Signed by:   Romualdo Bolk, CMA (AAMA) on 01/02/2010   Method used:   Electronically to        [facility]* (retail)       [address]       [place], [place]  [id number]       Ph: [phone]       Fax: [phone]   RxID:   [id number]

## 2010-12-20 NOTE — Assessment & Plan Note (Signed)
Summary: ?uti/low grader fever/ok per doc/njr   Vital Signs:  Patient Profile:   70 Years Old Female Weight:      128 pounds Temp:     99.1 degrees F oral Pulse rate:   78 / minute BP sitting:   110 / 80  (right arm) Cuff size:   regular  Vitals Entered By: [name], CMA (November 15, 2008 11:09 AM)                 Chief Complaint:  Fever.  History of Present Illness: [name] is here as a work in  for low grade fever  99 -100 range  since 11/13/08.Felt chilled also  No other signs.Advil helped her signs .   No flank pain ..some  urinary frequency on oxytrol.   with some help. Wet the bad a few weeks ago. Unusual for her . otherwise no cough URi NVD   ? if has uti. Slept a lot one day last week.    doenst feel right.      Prior Medications Reviewed Using: Patient Recall  Updated Prior Medication List: DICLOFENAC SODIUM 75 MG  TBEC (DICLOFENAC SODIUM) 1 two times a day D 1000 PLUS  TABS (FA-CYANOCOBALAMIN-B6-D-CA)  MAGNESIUM GLUCONATE 550 MG TABS (MAGNESIUM GLUCONATE)  OXYTROL 3.9 MG/24HR PTTW (OXYBUTYNIN) 1 patch weekly VALIUM 2 MG TABS (DIAZEPAM) 1/2 - 1 by mouth two times a day  Current Allergies (reviewed today): No known allergies   Past Medical History:    Renal Stones    Seasonal Allergies    Hyperlipidemia    Anemia-NOS    Urinary incontinence    UTIs     G3P2     Consults    Dr. Aldean Ast- urologist       Nephrolithiasis, hx of left  not passed   Past Surgical History:    Oophorectomy    Rt Superior Parathyroidectomy      wrist surgery    Social History:    Occupation: travel agent    Married    Never Smoked    Alcohol use-yes ocassional wine    Drug use-no    Regular exercise-no     Review of Systems  The patient denies anorexia, weight loss, chest pain, syncope, dyspnea on exertion, peripheral edema, prolonged cough, headaches, hemoptysis, abdominal pain, hematochezia, severe indigestion/heartburn, hematuria, difficulty  walking, abnormal bleeding, enlarged lymph nodes, and angioedema.     Physical Exam  General:     Well-developed,well-nourished,in no acute distress; alert,appropriate and cooperative throughout examination Head:     Normocephalic and atraumatic without obvious abnormalities. No apparent alopecia or balding. Neck:     No deformities, masses, or tenderness noted. Lungs:     Normal respiratory effort, chest expands symmetrically. Lungs are clear to auscultation, no crackles or wheezes. Heart:     Normal rate and regular rhythm. S1 and S2 normal without gallop, murmur, click, rub or other extra sounds. Abdomen:     Bowel sounds positive,abdomen soft and non-tender without masses, organomegaly noted.  no flank pain Msk:     no acute joint swellings  Pulses:     intact  Extremities:     no cce  Neurologic:     grossly nonfocal Skin:     turgor normal, color normal, no petechiae, and no purpura.   Cervical Nodes:     No lymphadenopathy noted Psych:     normally interactive, good eye contact, not anxious appearing, and not depressed appearing.  Impression & Recommendations:  Problem # 1:  FEVER UNSPECIFIED (ICD-780.60) poss uti monitor and cluture pending   Problem # 2:  UTI (ICD-599.0) seems most likjely cause of above  with vague signs  The following medications were removed from the medication list:    Nitrofurantoin Macrocrystal 100 Mg Caps (Nitrofurantoin macrocrystal) .[name]... 1 two times a day x 30 days  Her updated medication list for this problem includes:    Oxytrol 3.9 Mg/24hr Pttw (Oxybutynin) .[name]... 1 patch weekly    Bactrim Ds 800-160 Mg Tabs (Sulfamethoxazole-trimethoprim) .[name]... 1 by mouth two times a day  for 7 days   Problem # 3:  NEPHROLITHIASIS, HX OF (ICD-V13.01) left  Problem # 4:  HYPERTENSION (ICD-401.9)  BP today: 110/80 Prior BP: 120/92 (07/12/2008)  Prior 10 Yr Risk Heart Disease: Not enough information (02/10/2008)  Labs Reviewed: Creat:  0.6 (02/04/2008) Chol: 220 (02/04/2008)   HDL: 47.9 (02/04/2008)   LDL: 150.6 (02/04/2008)   TG: 82 (02/04/2008)   Complete Medication List: 1)  Diclofenac Sodium 75 Mg Tbec (Diclofenac sodium) .[name].. 1 two times a day 2)  D 1000 Plus Tabs (Fa-cyanocobalamin-b6-d-ca) 3)  Magnesium Gluconate 550 Mg Tabs (Magnesium gluconate) 4)  Oxytrol 3.9 Mg/24hr Pttw (Oxybutynin) .[name].. 1 patch weekly 5)  Valium 2 Mg Tabs (Diazepam) .... 1/2 - 1 by mouth two times a day 6)  Bactrim Ds 800-160 Mg Tabs (Sulfamethoxazole-trimethoprim) .[name].. 1 by mouth two times a day  for 7 days  Other Orders: UA Dipstick w/o Micro (automated)  (81003) T-Culture, Urine ([id number])   Patient Instructions: 1)  take antibiotic   2)  will call when culture back   3)  also if not better at end of medication then call    Prescriptions: BACTRIM DS 800-160 MG TABS (SULFAMETHOXAZOLE-TRIMETHOPRIM) 1 by mouth two times a day  for 7 days  #14 x 0   Entered and Authorized by:   Teresa Headings MD   Signed by:   Teresa Headings MD on 11/15/2008   Method used:   Electronically to        [facility]* (retail)       [address]       [place], [place]  [id number]       Ph: [phone]       Fax: [phone]   RxID:   [id number]  ] Laboratory Results   Urine Tests    Routine Urinalysis   Color: yellow Appearance: Clear Glucose: negative   (Normal Range: Negative) Bilirubin: negative   (Normal Range: Negative) Ketone: negative   (Normal Range: Negative) Spec. Gravity: 1.015   (Normal Range: 1.003-1.035) Blood: 2+   (Normal Range: Negative) pH: 6.0   (Normal Range: 5.0-8.0) Protein: 1+   (Normal Range: Negative) Urobilinogen: 0.2   (Normal Range: 0-1) Nitrite: positive   (Normal Range: Negative) Leukocyte Esterace: 3+   (Normal Range: Negative)    Comments: [name]  November 15, 2008 10:56 AM

## 2010-12-20 NOTE — Progress Notes (Signed)
Summary: pt wants to change to gen. actonel  Phone Note Call from Patient Call back at Home Phone [phone]   Caller: Patient Call For: panosh Summary of Call: notice from insurance, taking actonel, would i like to change to the generic. please let me know if that is something we can do.  Initial call taken by: Calvert Cantor,  June 22, 2007 2:34 PM  Follow-up for Phone Call        THere is no generic actonel but there is generic fosamax Ok to do 70mg  q week refill up to a year Follow-up by: Madelin Headings MD,  June 23, 2007 2:33 PM  Additional Follow-up for Phone Call Additional follow up Details #1::        [facility]MT[facility] Additional Follow-up by: Romualdo Bolk, CMA,  June 23, 2007 4:39 PM    Additional Follow-up for Phone Call Additional follow up Details #2::    [facility] Follow-up by: Romualdo Bolk, CMA,  June 30, 2007 1:16 PM  Additional Follow-up for Phone Call Additional follow up Details #3:: Details for Additional Follow-up Action Taken: [facility] on 07/06/08 at 9:45am. S.[place],CMA Pt aware and wants to try fosamax. [place]-sent thru EMR. Additional Follow-up by: Romualdo Bolk, CMA,  July 07, 2007 11:45 AM  New/Updated Medications: FOSAMAX 70 MG  TABS (ALENDRONATE SODIUM) 1 by mouth every week   Prescriptions: FOSAMAX 70 MG  TABS (ALENDRONATE SODIUM) 1 by mouth every week  #4 x 11   Entered by:   Romualdo Bolk, CMA   Authorized by:   Madelin Headings MD   Signed by:   Romualdo Bolk, CMA on 07/07/2007   Method used:   Electronically sent to ...       [facility]*       [address]       [place], [place]  [address]       Ph: [phone] or [phone]       Fax: [phone]   RxID:   [id number]

## 2010-12-20 NOTE — Progress Notes (Signed)
Summary: Pt has fever today  Phone Note Call from Patient Call back at Home Phone [phone]   Caller: Patient Call For: [name] Summary of Call: Pt called to report her vomiting and diarrhea have subsided.  She still does not feel well, however happy the vomiting has stopped.  She took her temp this am and it is 101.  She just took an Advil and called to let Dr Fabian Sharp know.  Pt has had some tea, oatmeal and a cracker for breakfast so far today, has stayed down/in so far. Initial call taken by: Sid Falcon LPN,  January 20, 2008 8:16 AM  Follow-up for Phone Call        Per md- if no sever pain, blood in stools or rashes, call tomorrow if still running a fever and schedule an ov. Spoke to pt- the fever has came down. She is not having any abd. pain, blood in stools or rashes. She will call tomorrow for an appt. if she still has a fever. Follow-up by: Romualdo Bolk, CMA,  January 20, 2008 9:51 AM

## 2010-12-20 NOTE — Assessment & Plan Note (Signed)
Summary: uti/ssc   Vital Signs:  Patient profile:   70 year old female Menstrual status:  postmenopausal Weight:      128 pounds Temp:     97.8 degrees F oral Pulse rate:   60 / minute BP sitting:   110 / 60  (right arm) Cuff size:   regular  Vitals Entered By: Romualdo Bolk, CMA (AAMA) (October 16, 2009 11:27 AM)  Serial Vital Signs/Assessments:  Time      Position  BP       Pulse  Resp  Temp     By                     152/78                         Madelin Headings MD  Comments: left arm sitting By: Madelin Headings MD   CC: Lower back pain, burning upon urination and frequency. This started a few weeks ago but go real bad on 11/26.   History of Present Illness: [name] comes  in today for acute problem  .  Has had som lbp and urine discomfort for almost a month but only recently  beacme uncomortable as above today .    no fever and lower back pain  burning  is bad and "leaking.  " No fever like last year when she had pyelonephritis.   rx with ?cipro and had  a month long rx per urology.  BP:  in 160 range  on higher dose of cozaar .   see last ov . has follow up appt early january   Preventive Screening-Counseling & Management  Alcohol-Tobacco     Alcohol drinks/day: 1     Alcohol type: wine     Smoking Status: never  Caffeine-Diet-Exercise     Caffeine use/day: 2-3     Does Patient Exercise: no  Current Medications (verified): 1)  D 1000 Plus  Tabs (Fa-Cyanocobalamin-B6-D-Ca) .... 2 Tablets By Mouth Once Daily 2)  Oxytrol 3.9 Mg/24hr Pttw (Oxybutynin) .[name].. 1 Patch Weekly 3)  Valium 2 Mg Tabs (Diazepam) .... 1/2 - 1 By Mouth Two Times A Day 4)  Flax   Oil (Flaxseed (Linseed)) 5)  Losartan Potassium 100 Mg Tabs (Losartan Potassium) .... One By Mouth Once Daily  Allergies (verified): 1)  Lisinopril (Lisinopril)  Past History:  Past medical, surgical, family and social histories (including risk factors) reviewed, and no changes noted (except as noted  below).  Past Medical History: Reviewed history from 05/25/2009 and no changes required. Seasonal Allergies G3P2  vaginal deliveries x 3  Consults Dr. Aldean Ast- urologist    Scoliosis  COLONIC POLYPS, ADENOMATOUS, HX OF (ICD-V12.72) CONSTIPATION (ICD-564.00) DIVERTICULOSIS, COLON (ICD-562.10) PARATHYROIDECTOMY (ICD-V45.79) UTI (ICD-599.0) FEVER UNSPECIFIED (ICD-780.60) NEPHROLITHIASIS, HX OF (ICD-V13.01)-hx of left not passed ABDOMINAL PAIN, UNSPECIFIED SITE (ICD-789.00) PALPITATIONS, OCCASIONAL (ICD-785.1) HYPERTENSION (ICD-401.9) OSTEOPOROSIS (ICD-733.00) RASH AND OTHER NONSPECIFIC SKIN ERUPTION (ICD-782.1) LEG PAIN, LEFT (ICD-729.5) CONSTIPATION, CHRONIC (ICD-564.09) RIB PAIN, RIGHT SIDED (ICD-786.50) HYPERPARATHYROIDISM UNSPECIFIED (ICD-252.00) FAMILY HISTORY DIABETES 1ST DEGREE RELATIVE (ICD-V18.0) FAMILY HISTORY BREAST CANCER 1ST DEGREE RELATIVE <50 (ICD-V16.3) URINARY INCONTINENCE (ICD-788.30) ANEMIA-NOS (ICD-285.9) HYPERLIPIDEMIA (ICD-272.4) Wrist Fracture  Past Surgical History: Reviewed history from 02/27/2009 and no changes required. Right Oophorectomy Rt Superior Parathyroidectomy    Family History: Reviewed history from 02/27/2009 and no changes required. Family History Breast cancer 1st degree relative <50 Family History Diabetes 1st degree relative Family History of Sudden Death  father 56  Family History of Cardiovascular disorder MOM RHD 27s  No FH of Colon Cancer:  Social History: Reviewed history from 02/27/2009 and no changes required. Occupation: travel agent Married Never Smoked Alcohol use-yes ocassional wine Drug use-no Regular exercise-no  hh of 2  no pets    Daily Caffeine Use 2-3 cups coffee in AM  Review of Systems  The patient denies anorexia, fever, weight loss, weight gain, vision loss, prolonged cough, abdominal pain, severe indigestion/heartburn, hematuria, genital sores, unusual weight change, abnormal bleeding, and enlarged  lymph nodes.    Physical Exam  General:  Well-developed,well-nourished,in no acute distress; alert,appropriate and cooperative throughout examination Head:  normocephalic and atraumatic.   Eyes:  vision grossly intact.   Lungs:  normal respiratory effort, no intercostal retractions, and no accessory muscle use.   Heart:  normal rate and regular rhythm.   Abdomen:  no flank pain  no cva tenderness  Msk:  pints to LS are as area of pain  Pulses:  nl cap refill  Neurologic:  non focal  Skin:  turgor normal and color normal.   Psych:  Oriented X3, normally interactive, good eye contact, and not anxious appearing.     Impression & Recommendations:  Problem # 1:  UTI (ICD-599.0)  Her updated medication list for this problem includes:    Oxytrol 3.9 Mg/24hr Pttw (Oxybutynin) .[name]... 1 patch weekly    Cipro 500 Mg Tabs (Ciprofloxacin hcl) .[name]... 1 by mouth two times a day  Orders: Prescription Created Electronically [phone])  Problem # 2:  HYPERTENSION (ICD-401.9) advil could interfere with bp med andhas  only been on incr dose  of cozaar  for a couple weeks.   will follow up  at rov  Her updated medication list for this problem includes:    Losartan Potassium 100 Mg Tabs (Losartan potassium) ..... One by mouth once daily  Problem # 3:  BOrderline PTH  cannot totally ro another adenoma  possible.  had adenoma removal 2 year ago.   will consider relook  if continuing difficulties   Problem # 4:  URINARY INCONTINENCE (ICD-788.30) Assessment: Deteriorated aggravated by prob uti  Problem # 5:  COUGH (ICD-786.2) from ace and resolved   Complete Medication List: 1)  D 1000 Plus Tabs (Fa-cyanocobalamin-b6-d-ca) .... 2 tablets by mouth once daily 2)  Oxytrol 3.9 Mg/24hr Pttw (Oxybutynin) .[name].. 1 patch weekly 3)  Valium 2 Mg Tabs (Diazepam) .... 1/2 - 1 by mouth two times a day 4)  Flax Oil (Flaxseed (linseed)) 5)  Losartan Potassium 100 Mg Tabs (Losartan potassium) .... One by mouth once  daily 6)  Cipro 500 Mg Tabs (Ciprofloxacin hcl) .[name].. 1 by mouth two times a day  Other Orders: UA Dipstick w/o Micro (automated)  (81003) Specimen Handling (60454) T-Culture, Urine ([id number])  Patient Instructions: 1)  take antibiotic  2)  keep follow up appt . 3)  bring in  blood pressure machine to   return office visit . 4)  You will be informed of lab results when available.  Prescriptions: CIPRO 500 MG TABS (CIPROFLOXACIN HCL) 1 by mouth two times a day  #14 x 0   Entered and Authorized by:   Madelin Headings MD   Signed by:   Madelin Headings MD on 10/16/2009   Method used:   Electronically to        [facility]* (retail)       [address] [facility]  [place], [place]  [address]       Ph: [phone]       Fax: [phone]   RxID:   [id number]   Laboratory Results   Urine Tests    Routine Urinalysis   Color: yellow Appearance: Clear Glucose: negative   (Normal Range: Negative) Bilirubin: negative   (Normal Range: Negative) Ketone: negative   (Normal Range: Negative) Spec. Gravity: 1.010   (Normal Range: 1.003-1.035) Blood: 3+   (Normal Range: Negative) pH: 7.0   (Normal Range: 5.0-8.0) Protein: negative   (Normal Range: Negative) Urobilinogen: 0.2   (Normal Range: 0-1) Nitrite: negative   (Normal Range: Negative) Leukocyte Esterace: 3+   (Normal Range: Negative)    Comments: Culture will be sent. Joanne Chars CMA  October 16, 2009 11:38 AM

## 2010-12-20 NOTE — Consult Note (Signed)
Summary: Dr Gerrit Friends note  Dr Gerrit Friends note   Imported By: Kassie Mends 12/14/2007 15:04:52  _____________________________________________________________________  External Attachment:    Type:   Image     Comment:   Dr Gerrit Friends note

## 2010-12-20 NOTE — Letter (Signed)
Summary: Dr. Gerrit Friends note  Dr. Gerrit Friends note   Imported By: Kassie Mends 08/14/2007 09:10:55  _____________________________________________________________________  External Attachment:    Type:   Image     Comment:   Dr. Gerrit Friends note

## 2010-12-20 NOTE — Progress Notes (Signed)
Summary: Elevated BP  Phone Note Call from Patient Call back at Home Phone [phone]   Caller: Patient Summary of Call: Has been taking losartan for the past 2 mnths, still has cough and bp is 195/90.  Please advise if an appt is needed or if med needs to be changed. Initial call taken by: Trixie Dredge,  October 05, 2009 1:34 PM  Follow-up for Phone Call        Per Almira Coaster, advised pt should be seen today.  Offered appt w/ Dr. Scotty Court at 3pm, pt unable to make this time.  Was given an appt w/ Dr. Caryl Never at 4pm Follow-up by: Trixie Dredge,  October 05, 2009 1:47 PM

## 2010-12-20 NOTE — Assessment & Plan Note (Signed)
Summary: 1 MO ROV/MM   Vital Signs:  Patient profile:   70 year old female Menstrual status:  postmenopausal Weight:      127 pounds Pulse rate:   66 / minute BP sitting:   160 / 90  (left arm) Cuff size:   regular  Vitals Entered By: Romualdo Bolk, CMA (AAMA) (November 20, 2009 11:11 AM)  Serial Vital Signs/Assessments:  Time      Position  BP       Pulse  Resp  Temp     By 11:15 AM            162/87                         Romualdo Bolk, CMA (AAMA)                     160/80                         Madelin Headings MD  Comments: 11:15 AM Pt's machine By: Romualdo Bolk, CMA (AAMA)  left arm sitting reg cuff By: Madelin Headings MD   CC: Follow-up visit on UTI and BP, Hypertension Management   History of Present Illness: [name] comesin for follow up of her BP and recurrent uti. Since last visit  here  there have been no major changes in health status  . She is feeling well  .  ZO:XWRUE from ace is gone and onn 100mg  of cozaar without se .  bp log here to review.   some at goal and many 150-160 range .  diastolic is normal   pulse  60-70. UTI:   seems better    sometimes hard to tell when gone . UIDue for urology  follow up .   Dr Aldean Ast. On  oxytrol  with some help.  Thinks its ok.  Fatigue  feels better now and no cv pulm prob now.     Hypertension History:      She denies headache, chest pain, palpitations, dyspnea with exertion, orthopnea, PND, peripheral edema, visual symptoms, neurologic problems, syncope, and side effects from treatment.  She notes no problems with any antihypertensive medication side effects.        Positive major cardiovascular risk factors include female age 23 years old or older, hyperlipidemia, and hypertension.  Negative major cardiovascular risk factors include non-tobacco-user status.     Preventive Screening-Counseling & Management  Alcohol-Tobacco     Alcohol drinks/day: 1     Alcohol type: wine     Smoking  Status: never  Caffeine-Diet-Exercise     Caffeine use/day: 2-3     Does Patient Exercise: no  Current Medications (verified): 1)  D 1000 Plus  Tabs (Fa-Cyanocobalamin-B6-D-Ca) .... 2 Tablets By Mouth Once Daily 2)  Oxytrol 3.9 Mg/24hr Pttw (Oxybutynin) .[name].. 1 Patch Weekly 3)  Valium 2 Mg Tabs (Diazepam) .... 1/2 - 1 By Mouth Two Times A Day 4)  Flax   Oil (Flaxseed (Linseed)) 5)  Losartan Potassium 100 Mg Tabs (Losartan Potassium) .... One By Mouth Once Daily  Allergies (verified): 1)  Lisinopril (Lisinopril)  Past History:  Past medical, surgical, family and social histories (including risk factors) reviewed, and no changes noted (except as noted below).  Past Medical History: Reviewed history from 05/25/2009 and no changes required. Seasonal Allergies G3P2  vaginal deliveries x 3  Consults Dr.  Kimbrough- urologist    Scoliosis  COLONIC POLYPS, ADENOMATOUS, HX OF (ICD-V12.72) CONSTIPATION (ICD-564.00) DIVERTICULOSIS, COLON (ICD-562.10) PARATHYROIDECTOMY (ICD-V45.79) UTI (ICD-599.0) FEVER UNSPECIFIED (ICD-780.60) NEPHROLITHIASIS, HX OF (ICD-V13.01)-hx of left not passed ABDOMINAL PAIN, UNSPECIFIED SITE (ICD-789.00) PALPITATIONS, OCCASIONAL (ICD-785.1) HYPERTENSION (ICD-401.9) OSTEOPOROSIS (ICD-733.00) RASH AND OTHER NONSPECIFIC SKIN ERUPTION (ICD-782.1) LEG PAIN, LEFT (ICD-729.5) CONSTIPATION, CHRONIC (ICD-564.09) RIB PAIN, RIGHT SIDED (ICD-786.50) HYPERPARATHYROIDISM UNSPECIFIED (ICD-252.00) FAMILY HISTORY DIABETES 1ST DEGREE RELATIVE (ICD-V18.0) FAMILY HISTORY BREAST CANCER 1ST DEGREE RELATIVE <50 (ICD-V16.3) URINARY INCONTINENCE (ICD-788.30) ANEMIA-NOS (ICD-285.9) HYPERLIPIDEMIA (ICD-272.4) Wrist Fracture  Past Surgical History: Right Oophorectomy Rt Superior Parathyroidectomy    open neck exploration  Family History: Reviewed history from 02/27/2009 and no changes required. Family History Breast cancer 1st degree relative <50 Family History Diabetes 1st  degree relative Family History of Sudden Death father 11  Family History of Cardiovascular disorder MOM RHD 62s  No FH of Colon Cancer:  Social History: Reviewed history from 02/27/2009 and no changes required. Occupation: travel agent Married Never Smoked Alcohol use-yes ocassional wine Drug use-no Regular exercise-no  hh of 2  no pets    Daily Caffeine Use 2-3 cups coffee in AM  Review of Systems  The patient denies anorexia, fever, weight loss, weight gain, vision loss, chest pain, syncope, dyspnea on exertion, peripheral edema, prolonged cough, headaches, melena, hematochezia, muscle weakness, difficulty walking, abnormal bleeding, enlarged lymph nodes, and angioedema.    Physical Exam  General:  Well-developed,well-nourished,in no acute distress; alert,appropriate and cooperative throughout examination Head:  normocephalic and atraumatic.   Eyes:  vision grossly intact.   Neck:  No deformities, masses, or tenderness noted. Lungs:  Normal respiratory effort, chest expands symmetrically. Lungs are clear to auscultation, no crackles or wheezes.no dullness.   Heart:  Normal rate and regular rhythm. S1 and S2 normal without gallop, murmur, click, rub or other extra sounds. Abdomen:  soft, non-tender, no hepatomegaly, and no splenomegaly.  no bruits  Pulses:  pulses intact without delay   Extremities:  no clubbing cyanosis or edema  Neurologic:  non focal no tremor   Skin:  turgor normal, color normal, no ecchymoses, and no petechiae.   Cervical Nodes:  No lymphadenopathy noted Psych:  Oriented X3, normally interactive, good eye contact, and not anxious appearing.   Additional Exam:  bp log reviewed    Impression & Recommendations:  Problem # 1:  HYPERTENSION (ICD-401.9)  sub optimal control    disc options.   get average bp in 140 or below range  Her updated medication list for this problem includes:    Losartan Potassium 100 Mg Tabs (Losartan potassium) ..... One by mouth  once daily    Amlodipine Besylate 2.5 Mg Tabs (Amlodipine besylate) .[name]... 1 by mouth once daily for high blood pressure   may increase as directed  Orders: Prescription Created Electronically [phone])  Problem # 2:  UTI'S, RECURRENT (ICD-599.0) Assessment: Improved  better today.   ttreating as they come . has  Ui at times on meds  sometime prob voiding  The following medications were removed from the medication list:    Cipro 500 Mg Tabs (Ciprofloxacin hcl) .[name]... 1 by mouth two times a day Her updated medication list for this problem includes:    Oxytrol 3.9 Mg/24hr Pttw (Oxybutynin) .[name]... 1 patch weekly  Orders: UA Dipstick w/o Micro (automated)  (81003)  Problem # 3:  PARATHYROIDECTOMY (ICD-V45.79) pth last check was mildly elevated    Had  total neck exploration with surgery for adenoma removal.   will  follow   if resistant  ht consider further  evaluation.   cehc pth at next blood test.   Problem # 4:  URINARY INCONTINENCE (ICD-788.30) Assessment: Unchanged stable on meds   Complete Medication List: 1)  D 1000 Plus Tabs (Fa-cyanocobalamin-b6-d-ca) .... 2 tablets by mouth once daily 2)  Oxytrol 3.9 Mg/24hr Pttw (Oxybutynin) .[name].. 1 patch weekly 3)  Valium 2 Mg Tabs (Diazepam) .... 1/2 - 1 by mouth two times a day 4)  Flax Oil (Flaxseed (linseed)) 5)  Losartan Potassium 100 Mg Tabs (Losartan potassium) .... One by mouth once daily 6)  Amlodipine Besylate 2.5 Mg Tabs (Amlodipine besylate) .[name].. 1 by mouth once daily for high blood pressure   may increase as directed  Hypertension Assessment/Plan:      The patient's hypertensive risk group is category B: At least one risk factor (excluding diabetes) with no target organ damage.  Today's blood pressure is 160/90.  Her blood pressure goal is < 140/90.  Patient Instructions: 1)  continue losartan   add  new medicaition for HBP. 2)  Limit sodium.   3)  continue to monitor BP readings. 4)  Call with readings in about 6 weeks and  then plan for follow up.  Prescriptions: AMLODIPINE BESYLATE 2.5 MG TABS (AMLODIPINE BESYLATE) 1 by mouth once daily for high blood pressure   may increase as directed  #30 x 3   Entered and Authorized by:   Madelin Headings MD   Signed by:   Madelin Headings MD on 11/20/2009   Method used:   Electronically to        [facility]* (retail)       [address]       [place], [place]  [id number]       Ph: [phone]       Fax: [phone]   RxID:   [id number]   Laboratory Results   Urine Tests    Routine Urinalysis   Glucose: negative   (Normal Range: Negative) Bilirubin: negative   (Normal Range: Negative) Ketone: negative   (Normal Range: Negative) Spec. Gravity: 1.015   (Normal Range: 1.003-1.035) Blood: negative   (Normal Range: Negative) pH: 7.0   (Normal Range: 5.0-8.0) Protein: negative   (Normal Range: Negative) Urobilinogen: 0.2   (Normal Range: 0-1) Nitrite: negative   (Normal Range: Negative) Leukocyte Esterace: trace   (Normal Range: Negative)

## 2010-12-20 NOTE — Consult Note (Signed)
Summary: [facility], MD  [facility], MD   Imported By: Lenard Forth 01/16/2008 11:08:32  _____________________________________________________________________  External Attachment:    Type:   Image     Comment:   External Document

## 2010-12-20 NOTE — Progress Notes (Signed)
Summary: rx for patches and cipro  Phone Note Call from Patient Call back at Home Phone [phone]   Caller: Patient Call For: Madelin Headings MD Summary of Call: Going on [place] vacation soon wants to get patches for sea sickness or Cipro??  Follow-up for Phone Call        DOes she mean sea sickness patches ? How long does she need this for.? transderm sopolomine   apply  to ear before cruise  and remove by 72 hours .   ? cipro for what  ? travelers diarrhea?  Follow-up by: Madelin Headings MD,  June 13, 2010 9:31 AM  Additional Follow-up for Phone Call Additional follow up Details #1::        Spoke with pt and she will be on the cruise for 7 days and needs the cipro for traveler's diarrhea. Pt wants rx called into [facility]. Additional Follow-up by: Romualdo Bolk, CMA (AAMA),  June 13, 2010 11:13 AM    Additional Follow-up for Phone Call Additional follow up Details #2::    disp 3  transderm patches  and dipro 500 1 by mouth two times a day disp 6 for travelers diarrhea if needed Follow-up by: Madelin Headings MD,  June 13, 2010 6:35 PM  Additional Follow-up for Phone Call Additional follow up Details #3:: Details for Additional Follow-up Action Taken: Rx sent to pharmacy and pt aware. Additional Follow-up by: Romualdo Bolk, CMA (AAMA),  June 14, 2010 8:31 AM  New/Updated Medications: TRANSDERM-SCOP 1.5 MG PT72 (SCOPOLAMINE BASE) apply 1 patch and change q 72 hours CIPRO 500 MG TABS (CIPROFLOXACIN HCL) 1 by mouth two times a day if needed for traveler's diarrhea Prescriptions: CIPRO 500 MG TABS (CIPROFLOXACIN HCL) 1 by mouth two times a day if needed for traveler's diarrhea  #6 x 0   Entered by:   Romualdo Bolk, CMA (AAMA)   Authorized by:   Madelin Headings MD   Signed by:   Romualdo Bolk, CMA (AAMA) on 06/14/2010   Method used:   Electronically to        [facility]* (retail)       [address]       [place], [place]  [id number]  Ph: [phone]       Fax: [phone]   RxID:   [id number] TRANSDERM-SCOP 1.5 MG PT72 (SCOPOLAMINE BASE) apply 1 patch and change q 72 hours  #3 x 0   Entered by:   Romualdo Bolk, CMA (AAMA)   Authorized by:   Madelin Headings MD   Signed by:   Romualdo Bolk, CMA (AAMA) on 06/14/2010   Method used:   Electronically to        [facility]* (retail)       [address]       [place], [place]  [id number]       Ph: [phone]       Fax: [phone]   RxID:   [id number]

## 2010-12-20 NOTE — Assessment & Plan Note (Signed)
Summary: CONGESTION // RS   Vital Signs:  Patient profile:   70 year old female Menstrual status:  postmenopausal Weight:      128 pounds O2 Sat:      99 % on Room air Temp:     98.6 degrees F oral Pulse rate:   69 / minute BP sitting:   120 / 84  (right arm) Cuff size:   regular  Vitals Entered By: Romualdo Bolk, CMA (AAMA) (October 31, 2010 3:33 PM)  O2 Flow:  Room air CC: Coughing, congestion since 12/12. Temp 99 this am., Lipid Management   History of Present Illness: [name]. comes in today  for acute visit . she has had about 3 days of  uri signs more severe than a head cold .Different than a head cold   because  just drips a lot and dry cough.  and  now  has face pain. today. Upper teeth and pain when bending.   cant take  decongestants . breathing  is difficult  ? sometime .     symptoms are peristent  Temp of 99 .   achy but no chills  no cp  no pleurisy   Lipid Management History:      Positive NCEP/ATP III risk factors include female age 48 years old or older and hypertension.  Negative NCEP/ATP III risk factors include HDL cholesterol greater than 60 and non-tobacco-user status.    Preventive Screening-Counseling & Management  Alcohol-Tobacco     Alcohol drinks/day: 1     Alcohol type: wine     Smoking Status: never  Caffeine-Diet-Exercise     Caffeine use/day: 2-3     Does Patient Exercise: no  Current Medications (verified): 1)  Valium 2 Mg Tabs (Diazepam) .... 1/2 - 1 By Mouth Two Times A Day 2)  Flax   Oil (Flaxseed (Linseed))  Allergies (verified): 1)  Lisinopril (Lisinopril)  Past History:  Past medical, surgical, family and social histories (including risk factors) reviewed, and no changes noted (except as noted below).  Past Medical History: Reviewed history from 05/25/2009 and no changes required. Seasonal Allergies G3P2  vaginal deliveries x 3  Consults Dr. Aldean Ast- urologist    Scoliosis  COLONIC POLYPS, ADENOMATOUS, HX OF  (ICD-V12.72) CONSTIPATION (ICD-564.00) DIVERTICULOSIS, COLON (ICD-562.10) PARATHYROIDECTOMY (ICD-V45.79) UTI (ICD-599.0) FEVER UNSPECIFIED (ICD-780.60) NEPHROLITHIASIS, HX OF (ICD-V13.01)-hx of left not passed ABDOMINAL PAIN, UNSPECIFIED SITE (ICD-789.00) PALPITATIONS, OCCASIONAL (ICD-785.1) HYPERTENSION (ICD-401.9) OSTEOPOROSIS (ICD-733.00) RASH AND OTHER NONSPECIFIC SKIN ERUPTION (ICD-782.1) LEG PAIN, LEFT (ICD-729.5) CONSTIPATION, CHRONIC (ICD-564.09) RIB PAIN, RIGHT SIDED (ICD-786.50) HYPERPARATHYROIDISM UNSPECIFIED (ICD-252.00) FAMILY HISTORY DIABETES 1ST DEGREE RELATIVE (ICD-V18.0) FAMILY HISTORY BREAST CANCER 1ST DEGREE RELATIVE <50 (ICD-V16.3) URINARY INCONTINENCE (ICD-788.30) ANEMIA-NOS (ICD-285.9) HYPERLIPIDEMIA (ICD-272.4) Wrist Fracture  Past Surgical History: Reviewed history from 11/20/2009 and no changes required. Right Oophorectomy Rt Superior Parathyroidectomy    open neck exploration  Past History:  Care Management: Gastroenterology: Juanda Chance Urology: [facility]  Family History: Reviewed history from 02/27/2009 and no changes required. Family History Breast cancer 1st degree relative <50 Family History Diabetes 1st degree relative Family History of Sudden Death father 1  Family History of Cardiovascular disorder MOM RHD 57s  No FH of Colon Cancer:  Social History: Reviewed history from 09/17/2010 and no changes required. Occupation: travel agent Married Never Smoked Alcohol use-yes ocassional wine Drug use-no Regular exercise-no   no pets     hhof 5  kids at home now  Daily Caffeine Use 2-3 cups coffee in AM  Review of Systems  The patient denies anorexia, weight loss, vision loss, chest pain, syncope, prolonged cough, hemoptysis, and abdominal pain.         some feels  some sob in chest at times.   no gu  gi changes  .    no new rashes  rest see hpi  Physical Exam  General:  congested in nad  mildly ill and not toxic  good color  Head:   Normocephalic and atraumatic without obvious abnormalities. No apparent alopecia or balding. Eyes:  clear  no dischrge vision grossly intact.   Ears:  R ear normal and L ear normal.   Nose:  no external deformity and no external erythema.  congested clear dc  tender bilatera l face  Mouth:  pharynx pink and moist.   Neck:  No deformities, masses, or tenderness noted. Lungs:  Normal respiratory effort, chest expands symmetrically. Lungs are clear to auscultation, no crackles or wheezes.normal breath sounds.   Heart:  Normal rate and regular rhythm. S1 and S2 normal without gallop, murmur, click, rub or other extra sounds. Pulses:  nl cap refill  Neurologic:  non focal intact  Skin:  turgor normal and color normal.   Cervical Nodes:  No lymphadenopathy noted Psych:  Oriented X3, memory intact for recent and remote, normally interactive, good eye contact, not anxious appearing, and not depressed appearing.     Impression & Recommendations:  Problem # 1:  SINUSITIS - ACUTE-NOS (ICD-461.9) Assessment New ok to add antihistamine    and saline  if helps and warm compresses   add antibiotic if pain not improving in the next few days    Her updated medication list for this problem includes:    Amoxicillin 875 Mg Tabs (Amoxicillin) .[name]... 1 by mouth three times a day for sinusitis  Problem # 2:  URI (ICD-465.9) Assessment: New call for alarm features and recheck.   Complete Medication List: 1)  Valium 2 Mg Tabs (Diazepam) .... 1/2 - 1 by mouth two times a day 2)  Flax Oil (Flaxseed (linseed)) 3)  Amoxicillin 875 Mg Tabs (Amoxicillin) .[name].. 1 by mouth three times a day for sinusitis  Lipid Assessment/Plan:      Based on NCEP/ATP III, the patient's risk factor category is "0-1 risk factors".  The patient's lipid goals are as follows: Total cholesterol goal is 200; LDL cholesterol goal is 160; HDL cholesterol goal is 40; Triglyceride goal is 150.     Patient Instructions: 1)  Acute sinusitis  symptoms for less than 10 days are not helped by antibiotics. Use warm moist compresses, and over the counter decongestants( only as directed). Call if no improvement in 5-7 days, sooner if increasing pain, fever, or new symptoms.  2)  however if face pain persists over the next 48 hours add antibioitc. 3)  call if significant fever   if not better .  or more short of breath .  Prescriptions: AMOXICILLIN 875 MG TABS (AMOXICILLIN) 1 by mouth three times a day for sinusitis  #30 x 0   Entered and Authorized by:   Madelin Headings MD   Signed by:   Madelin Headings MD on 10/31/2010   Method used:   Print then Give to Patient   RxID:   [id number]    Orders Added: 1)  Est. Patient Level IV [86578]

## 2010-12-20 NOTE — Assessment & Plan Note (Signed)
Summary: BP ELEVATED 195/90.Marland KitchenSLM   Vital Signs:  Patient profile:   70 year old female Menstrual status:  postmenopausal Temp:     98.2 degrees F oral BP sitting:   170 / 100  (left arm) Cuff size:   regular  Vitals Entered By: Sid Falcon LPN (October 05, 2009 4:04 PM)  Serial Vital Signs/Assessments:  Time      Position  BP       Pulse  Resp  Temp     By                     160/80                         Evelena Peat MD  CC: Elevated BP, pt coughing alot, med or virus?   History of Present Illness: Patient seen as a work in. History of hypertension. Recently treated with lisinopril and question cough related to ACE inhibitor. Has been on losartan 50 mg one daily and elevated blood pressure of 195/90 at home earlier today. That prompted this visit. No headaches other than possible mild occasional sinus headache. Patient compliant with medication.  She does have some persistent dry cough. No dyspnea or pleuritic pain. Denies appetite or weight changes. Nonsmoker.  Allergies (verified): 1)  Lisinopril (Lisinopril)  Past History:  Past Medical History: Last updated: 05/25/2009 Seasonal Allergies G3P2  vaginal deliveries x 3  Consults Dr. Aldean Ast- urologist    Scoliosis  COLONIC POLYPS, ADENOMATOUS, HX OF (ICD-V12.72) CONSTIPATION (ICD-564.00) DIVERTICULOSIS, COLON (ICD-562.10) PARATHYROIDECTOMY (ICD-V45.79) UTI (ICD-599.0) FEVER UNSPECIFIED (ICD-780.60) NEPHROLITHIASIS, HX OF (ICD-V13.01)-hx of left not passed ABDOMINAL PAIN, UNSPECIFIED SITE (ICD-789.00) PALPITATIONS, OCCASIONAL (ICD-785.1) HYPERTENSION (ICD-401.9) OSTEOPOROSIS (ICD-733.00) RASH AND OTHER NONSPECIFIC SKIN ERUPTION (ICD-782.1) LEG PAIN, LEFT (ICD-729.5) CONSTIPATION, CHRONIC (ICD-564.09) RIB PAIN, RIGHT SIDED (ICD-786.50) HYPERPARATHYROIDISM UNSPECIFIED (ICD-252.00) FAMILY HISTORY DIABETES 1ST DEGREE RELATIVE (ICD-V18.0) FAMILY HISTORY BREAST CANCER 1ST DEGREE RELATIVE <50  (ICD-V16.3) URINARY INCONTINENCE (ICD-788.30) ANEMIA-NOS (ICD-285.9) HYPERLIPIDEMIA (ICD-272.4) Wrist Fracture  Social History: Last updated: 02/27/2009 Occupation: travel agent Married Never Smoked Alcohol use-yes ocassional wine Drug use-no Regular exercise-no  hh of 2  no pets    Daily Caffeine Use 2-3 cups coffee in AM  Review of Systems      See HPI  Physical Exam  General:  Well-developed,well-nourished,in no acute distress; alert,appropriate and cooperative throughout examination Mouth:  Oral mucosa and oropharynx without lesions or exudates.  Teeth in good repair. Neck:  No deformities, masses, or tenderness noted. Lungs:  Normal respiratory effort, chest expands symmetrically. Lungs are clear to auscultation, no crackles or wheezes. Heart:  regular rhythm and rate. No S3 Extremities:  No clubbing, cyanosis, edema, or deformity noted with normal full range of motion of all joints.     Impression & Recommendations:  Problem # 1:  HYPERTENSION (ICD-401.9) suboptimally controlled. Titrate losartan 100 mg daily and followup with Dr. Fabian Sharp within one month.  Pt will monitor BP at home and F/U sooner if BP consistently > 160 before then. The following medications were removed from the medication list:    Lisinopril 10 Mg Tabs (Lisinopril) .[name]... 1 by mouth once daily Her updated medication list for this problem includes:    Losartan Potassium 100 Mg Tabs (Losartan potassium) ..... One by mouth once daily  Complete Medication List: 1)  D 1000 Plus Tabs (Fa-cyanocobalamin-b6-d-ca) .... 2 tablets by mouth once daily 2)  Oxytrol 3.9 Mg/24hr Pttw (Oxybutynin) .[name].. 1 patch weekly 3)  Valium 2 Mg Tabs (Diazepam) .... 1/2 - 1 by mouth two times a day 4)  Flax Oil (Flaxseed (linseed)) 5)  Losartan Potassium 100 Mg Tabs (Losartan potassium) .... One by mouth once daily  Patient Instructions: 1)  Schedule follow up with Dr. Fabian Sharp in one month. 2)  Limit your Sodium(salt) .   Prescriptions: LOSARTAN POTASSIUM 100 MG TABS (LOSARTAN POTASSIUM) one by mouth once daily  #30 x 5   Entered and Authorized by:   Evelena Peat MD   Signed by:   Evelena Peat MD on 10/05/2009   Method used:   Electronically to        [facility]* (retail)       [address]       [place], [place]  [address]       Ph: [phone]       Fax: [phone]   RxID:   [id number]

## 2010-12-20 NOTE — Op Note (Signed)
Summary: COLON (Dr Madilyn Fireman)    NAME:  [name], [name]                            ACCOUNT NO.:  [id number]   MEDICAL RECORD NO.:  [id number]                   PATIENT TYPE:  AMB   LOCATION:  ENDO                                 FACILITY:  [facility]   PHYSICIAN:  John C. Madilyn Fireman, M.D.                 DATE OF BIRTH:  13-Jul-1941   DATE OF PROCEDURE:  06/17/2003  DATE OF DISCHARGE:                                 OPERATIVE REPORT   PROCEDURE:  Colonoscopy with polypectomy.   INDICATIONS FOR PROCEDURE:  Colon cancer screening and chronic constipation.   DESCRIPTION OF PROCEDURE:  The patient was placed in the left lateral  decubitus position then placed on the pulse monitor with continuous low flow  oxygen delivered by nasal cannula. She was sedated with 50 mcg IV fentanyl  and 6 mg IV Versed. The Olympus video colonoscope was inserted into the  rectum and advanced to the cecum, confirmed by transillumination at  McBurney's point and visualization of the ileocecal valve and appendiceal  orifice. The prep was excellent. At the appendiceal orifice in the base of  the cecum, there was a small multilobular area of tissue consistent in  appearance with villous adenoma. This was essentially at appendiceal  orifice. One hot biopsy was performed and then the remaining visible  elements carefully fulgurated with the closed hot biopsy forceps. The  remainder of the cecum, ascending, transverse, descending and sigmoid colon  all appeared normal with no masses, polyps, diverticula or other mucosal  abnormalities. The scope was then withdrawn and the patient returned to the  recovery room in stable condition. She tolerated the procedure well and  there were no immediate complications.   IMPRESSION:  Cecal polyp at base of the appendix.   PLAN:  Await histology.                                               John C. Madilyn Fireman, M.D.    J[facility]/MEDQ  D:  06/17/2003  T:  06/17/2003  Job:  [id number]   cc:   Andres Ege, M.D.  [address]  [place]  [place] [address]  Fax: [phone]   FINAL DIAGNOSIS    ***MICROSCOPIC EXAMINATION AND DIAGNOSIS***    COLON, CECAL POLYP AT APPENDICEAL ORIFICE: TUBULAR ADENOMA. NO   HIGH GRADE DYSPLASIA OR MALIGNANCY IDENTIFIED.    kv   Date Reported: 06/20/2003 Beulah Gandy. Luisa Hart, MD   *** Electronically Signed Out By JDP ***    Clinical information   R/o adenomatous (ah)    specimen(s) obtained   Colon, polyp(s), cecum at appendical orifice    Gross Description   Received in formalin is a tan, soft tissue fragment that is   submitted  in toto. Size: 0.3 cm SW:mw 06-17-03    mw/

## 2010-12-20 NOTE — Letter (Signed)
Summary: [facility]  [facility]   Imported By: Maryln Gottron 06/05/2010 12:10:28  _____________________________________________________________________  External Attachment:    Type:   Image     Comment:   External Document

## 2010-12-20 NOTE — Procedures (Signed)
Summary: Colonoscopy Report/[facility]  Colonoscopy Report/[facility]   Imported By: Maryln Gottron 03/09/2008 12:51:30  _____________________________________________________________________  External Attachment:    Type:   Image     Comment:   External Document

## 2010-12-20 NOTE — Progress Notes (Signed)
Summary: NEW RX  Phone Note Call from Patient Call back at Home Phone [phone]   Caller: Patient-LIVE CALL Call For: DR [facility] Summary of Call: PT WOULD LIKE A NEW RX FOR VALIUM. GOING ON A LONG FLIGHT. NEEDS THIS WEEK. CALL [place]. THANKS. Initial call taken by: Warnell Forester,  September 02, 2008 1:55 PM  Follow-up for Phone Call        please refill valium   I realize she usually takes 1/4 of a 5 mg  how about 2 mg and tak 1/2 to 1 by mouth two times a day as needed Disp 20# no refill. Follow-up by: Madelin Headings MD,  September 04, 2008 1:00 PM  Additional Follow-up for Phone Call Additional follow up Details #1::        Rx called in and pt aware. Additional Follow-up by: Romualdo Bolk, CMA,  September 05, 2008 10:15 AM    New/Updated Medications: VALIUM 2 MG TABS (DIAZEPAM) 1/2 - 1 by mouth two times a day   Prescriptions: VALIUM 2 MG TABS (DIAZEPAM) 1/2 - 1 by mouth two times a day  #20 x 0   Entered by:   Romualdo Bolk, CMA   Authorized by:   Madelin Headings MD   Signed by:   Romualdo Bolk, CMA on 09/05/2008   Method used:   Telephoned to ...       [facility]* (retail)       [address]       [place], [place]  [id number]       Ph: [phone]       Fax: [phone]   RxID:   [id number]

## 2010-12-20 NOTE — Assessment & Plan Note (Signed)
Summary: follow up on blood pressure/cjr   Vital Signs:  Patient profile:   70 year old female Menstrual status:  postmenopausal Height:      62 inches Weight:      128 pounds BMI:     23.50 Temp:     98.2 degrees F oral Pulse rate:   71 / minute Pulse rhythm:   regular BP sitting:   122 / 80  (right arm) Cuff size:   regular  Vitals Entered By: Darra Lis RMA (July 31, 2009 2:04 PM)  Serial Vital Signs/Assessments:  Time      Position  BP       Pulse  Resp  Temp     By                     118/78                         Madelin Headings MD  CC: follow up for blood pressure/patient c/o a cough x 2 months.   History of Present Illness: [name] comes in for follow up of multiple medical problems . Since last visit  here  there have been no major changes in health status  .  HT  : COntrolled and feels well.  DOes have a nagging  cough  for the past 2 months .   NO cp or sob. UI : about the same nochange [facility]: no fractures  .  Preventive Screening-Counseling & Management  Alcohol-Tobacco     Alcohol drinks/day: 1     Alcohol type: wine     Smoking Status: never  Caffeine-Diet-Exercise     Caffeine use/day: 2-3     Does Patient Exercise: no  Current Medications (verified): 1)  D 1000 Plus  Tabs (Fa-Cyanocobalamin-B6-D-Ca) .... 2 Tablets By Mouth Once Daily 2)  Oxytrol 3.9 Mg/24hr Pttw (Oxybutynin) .[name].. 1 Patch Weekly 3)  Valium 2 Mg Tabs (Diazepam) .... 1/2 - 1 By Mouth Two Times A Day 4)  Flax   Oil (Flaxseed (Linseed)) 5)  Lisinopril 10 Mg Tabs (Lisinopril) .[name].. 1 By Mouth Once Daily  Allergies (verified): No Known Drug Allergies  Past History:  Past medical, surgical, family and social histories (including risk factors) reviewed, and no changes noted (except as noted below).  Past Medical History: Reviewed history from 05/25/2009 and no changes required. Seasonal Allergies G3P2  vaginal deliveries x 3  Consults Dr. Aldean Ast- urologist      Scoliosis  COLONIC POLYPS, ADENOMATOUS, HX OF (ICD-V12.72) CONSTIPATION (ICD-564.00) DIVERTICULOSIS, COLON (ICD-562.10) PARATHYROIDECTOMY (ICD-V45.79) UTI (ICD-599.0) FEVER UNSPECIFIED (ICD-780.60) NEPHROLITHIASIS, HX OF (ICD-V13.01)-hx of left not passed ABDOMINAL PAIN, UNSPECIFIED SITE (ICD-789.00) PALPITATIONS, OCCASIONAL (ICD-785.1) HYPERTENSION (ICD-401.9) OSTEOPOROSIS (ICD-733.00) RASH AND OTHER NONSPECIFIC SKIN ERUPTION (ICD-782.1) LEG PAIN, LEFT (ICD-729.5) CONSTIPATION, CHRONIC (ICD-564.09) RIB PAIN, RIGHT SIDED (ICD-786.50) HYPERPARATHYROIDISM UNSPECIFIED (ICD-252.00) FAMILY HISTORY DIABETES 1ST DEGREE RELATIVE (ICD-V18.0) FAMILY HISTORY BREAST CANCER 1ST DEGREE RELATIVE <50 (ICD-V16.3) URINARY INCONTINENCE (ICD-788.30) ANEMIA-NOS (ICD-285.9) HYPERLIPIDEMIA (ICD-272.4) Wrist Fracture  Past Surgical History: Reviewed history from 02/27/2009 and no changes required. Right Oophorectomy Rt Superior Parathyroidectomy    Family History: Reviewed history from 02/27/2009 and no changes required. Family History Breast cancer 1st degree relative <50 Family History Diabetes 1st degree relative Family History of Sudden Death father 50  Family History of Cardiovascular disorder MOM RHD 63s  No FH of Colon Cancer:  Social History: Reviewed history from 02/27/2009 and no changes required. Occupation: travel agent  Married Never Smoked Alcohol use-yes ocassional wine Drug use-no Regular exercise-no  hh of 2  no pets    Daily Caffeine Use 2-3 cups coffee in AM  Review of Systems       The patient complains of prolonged cough.  The patient denies anorexia, fever, weight loss, weight gain, vision loss, decreased hearing, chest pain, syncope, dyspnea on exertion, peripheral edema, abdominal pain, melena, hematochezia, severe indigestion/heartburn, hematuria, transient blindness, difficulty walking, unusual weight change, abnormal bleeding, enlarged lymph nodes, and  angioedema.    Physical Exam  General:  Well-developed,well-nourished,in no acute distress; alert,appropriate and cooperative throughout examination Head:  normocephalic and atraumatic.   Eyes:  glasses vision grossly intact.   Neck:  No deformities, masses, or tenderness noted. Breasts:  scoliosis no change Lungs:  Normal respiratory effort, chest expands symmetrically. Lungs are clear to auscultation, no crackles or wheezes. Heart:  Normal rate and regular rhythm. S1 and S2 normal without gallop, murmur, click, rub or other extra sounds. Abdomen:  Bowel sounds positive,abdomen soft and non-tender without masses, organomegaly or  s noted. Pulses:  pulses intact without delay   Extremities:  no clubbing cyanosis or edema  Neurologic:  alert & oriented X3 and gait normal.  grossly non focal  Skin:  turgor normal, color normal, no ecchymoses, and no petechiae.   Cervical Nodes:  No lymphadenopathy noted Psych:  Oriented X3, good eye contact, not anxious appearing, and not depressed appearing.   Additional Exam:  see labs reviewed    Impression & Recommendations:  Problem # 1:  HYPERTENSION (ICD-401.9) Assessment Improved  unfortunately  i think cough is from the med  Her updated medication list for this problem includes:    Lisinopril 10 Mg Tabs (Lisinopril) .[name]... 1 by mouth once daily    Losartan Potassium 50 Mg Tabs (Losartan potassium) .[name]... Take 1/2 to 1 by mouth once daily  BP today: 122/80 Prior BP: 130/80 (05/25/2009)  Prior 10 Yr Risk Heart Disease: 11 % (01/02/2009)  Labs Reviewed: K+: 3.9 (05/25/2009) Creat: : 0.7 (05/25/2009)   Chol: 245 (05/25/2009)   HDL: 55.60 (05/25/2009)   LDL: DEL (02/04/2008)   TG: 84.0 (05/25/2009)  Orders: Prescription Created Electronically [id number])  Problem # 2:  COUGH (ICD-786.2) suspect from the acei    disc options  will change to cozaar .  call if not resolved in 4-6 weeks or as needed other wise  rov in 6 months   Problem # 3:   PARATHYROIDECTOMY (ICD-V45.79) pth still borderline   will follow   Problem # 4:  HYPERLIPIDEMIA (ICD-272.4) rec lifestyle intervention  Labs Reviewed: SGOT: 20 (05/25/2009)   SGPT: 14 (05/25/2009)  Prior 10 Yr Risk Heart Disease: 11 % (01/02/2009)   HDL:55.60 (05/25/2009), 47.9 (02/04/2008)  LDL:DEL (02/04/2008)  Chol:245 (05/25/2009), 220 (02/04/2008)  Trig:84.0 (05/25/2009), 82 (02/04/2008)  Problem # 5:  ANEMIA-NOS (ICD-285.9) better  Her updated medication list for this problem includes:    D 1000 Plus Tabs (Fa-cyanocobalamin-b6-d-ca) .[name]... 2 tablets by mouth once daily  Complete Medication List: 1)  D 1000 Plus Tabs (Fa-cyanocobalamin-b6-d-ca) .... 2 tablets by mouth once daily 2)  Oxytrol 3.9 Mg/24hr Pttw (Oxybutynin) .[name].. 1 patch weekly 3)  Valium 2 Mg Tabs (Diazepam) .... 1/2 - 1 by mouth two times a day 4)  Flax Oil (Flaxseed (linseed)) 5)  Lisinopril 10 Mg Tabs (Lisinopril) .[name].. 1 by mouth once daily 6)  Losartan Potassium 50 Mg Tabs (Losartan potassium) .... Take 1/2 to 1 by mouth once daily  Patient Instructions: 1)  change  from lisinopril   to  cozaar  . 2)  Take one per day . If BP readings are low ( ie 100 or less then decrease by 1/2   ie 25 mg)  3)  return office visit in 6 months .  Prescriptions: LOSARTAN POTASSIUM 50 MG TABS (LOSARTAN POTASSIUM) take 1/2 to 1 by mouth once daily  #30 x 6   Entered and Authorized by:   Madelin Headings MD   Signed by:   Madelin Headings MD on 07/31/2009   Method used:   Electronically to        [facility]* (retail)       [address]       [place], [place]  [id number]       Ph: [phone]       Fax: [phone]   RxID:   [id number]

## 2010-12-20 NOTE — Progress Notes (Signed)
Summary: change of Actonel?  Phone Note Call from Patient   Caller: Patient Call For: Dr. Fabian Sharp Summary of Call: Pt would like Dr. Fabian Sharp to call in monthly Actonel instead of weekly. [phone] Initial call taken by: Lynann Beaver CMA,  May 09, 2008 9:39 AM  Follow-up for Phone Call        Sent electronically. Follow-up by: Romualdo Bolk, CMA,  May 09, 2008 1:30 PM    New/Updated Medications: ACTONEL 150 MG  TABS (RISEDRONATE SODIUM) 1 by mouth monthly   Prescriptions: ACTONEL 150 MG  TABS (RISEDRONATE SODIUM) 1 by mouth monthly  #1 x 6   Entered by:   Romualdo Bolk, CMA   Authorized by:   Madelin Headings MD   Signed by:   Romualdo Bolk, CMA on 05/09/2008   Method used:   Electronically sent to ...       [facility]*       [address]       [place], [place]  [id number]       Ph: [phone]       Fax: [phone]   RxID:   [id number]

## 2010-12-20 NOTE — Progress Notes (Signed)
Summary: UTI and elevated BP  Phone Note Call from Patient   Caller: Patient Call For: Madelin Headings MD Summary of Call: Pt wants Dr. Fabian Sharp to know she went to the ER last night and diagnosed with UTI, and her BP was 157/88.  Is asking if Dr. Fabian Sharp should change BP meds again? [phone] Initial call taken by: Lynann Beaver CMA,  January 29, 2010 9:26 AM  Follow-up for Phone Call        Pt is calling back to ask that Dr. Fabian Sharp respond to the message from yesterday. Follow-up by: Lynann Beaver CMA,  January 30, 2010 10:35 AM  Additional Follow-up for Phone Call Additional follow up Details #1::        i reviewed her ed records.      sometimes  bp elevated from pain. Before deciding change, need to know  BP   readings  after treatment for her urine infection ( ie readings since then)  . She may need rov to address this  Additional Follow-up by: Madelin Headings MD,  January 30, 2010 4:55 PM    Additional Follow-up for Phone Call Additional follow up Details #2::    Pt aware and will call back to give [place] the bp readings a week after she finishes her antibiotics. Follow-up by: Romualdo Bolk, CMA (AAMA),  January 30, 2010 5:02 PM

## 2010-12-20 NOTE — Assessment & Plan Note (Signed)
Summary: CONSTIPATION/MAILED NP3 PKT/FH   History of Present Illness Visit Type: consult Primary GI MD: [name] MD Primary Provider: Berniece Andreas, MD Requesting Provider: Berniece Andreas, MD Chief Complaint: constipation History of Present Illness:   This is a 70 year old white female with irritable bowel syndrome and constipation as well but  incontinent diarrhea after using laxatives. She has not been able to take the MiraLax or  laxatives because of incontinent diarrhea. It has limited her travel. She sometimes uses manual pressure to evacuate the stool. She has urinary stress incontinence. A flexible sigmoidoscopy in 1997 by me showed a torturous colon with diverticuli. A subsequent colonoscopy in 2004 and in 2009 by Dr [place] Bing. showed diverticulosis but an otherwise normal colon. There was a tubular adenoma in the appendix. She is due for a a recall colonoscopy 5 years from now.   GI Review of Systems    Reports abdominal pain, acid reflux, bloating, and  heartburn.      Denies belching, chest pain, dysphagia with liquids, dysphagia with solids, loss of appetite, nausea, vomiting, vomiting blood, weight loss, and  weight gain.      Reports constipation, diarrhea, and  fecal incontinence.     Denies anal fissure, black tarry stools, change in bowel habit, diverticulosis, heme positive stool, hemorrhoids, irritable bowel syndrome, jaundice, light color stool, liver problems, rectal bleeding, and  rectal pain.    Current Medications (verified): 1)  D 1000 Plus  Tabs (Fa-Cyanocobalamin-B6-D-Ca) .... 2 Tablets By Mouth Once Daily 2)  Magnesium Gluconate 550 Mg Tabs (Magnesium Gluconate) .... Take 1 Tablet By Mouth Once A Day 3)  Oxytrol 3.9 Mg/24hr Pttw (Oxybutynin) .[name].. 1 Patch Weekly 4)  Valium 2 Mg Tabs (Diazepam) .... 1/2 - 1 By Mouth Two Times A Day  Allergies (verified): No Known Drug Allergies  Past History:  Past Medical History:    Seasonal Allergies    G3P2  vaginal  deliveries x 3     Consults    Dr. Aldean Ast- urologist           COLONIC POLYPS, ADENOMATOUS, HX OF (ICD-V12.72)    CONSTIPATION (ICD-564.00)    DIVERTICULOSIS, COLON (ICD-562.10)    PARATHYROIDECTOMY (ICD-V45.79)    UTI (ICD-599.0)    FEVER UNSPECIFIED (ICD-780.60)    NEPHROLITHIASIS, HX OF (ICD-V13.01)-hx of left not passed    ABDOMINAL PAIN, UNSPECIFIED SITE (ICD-789.00)    PALPITATIONS, OCCASIONAL (ICD-785.1)    HYPERTENSION (ICD-401.9)    OSTEOPOROSIS (ICD-733.00)    RASH AND OTHER NONSPECIFIC SKIN ERUPTION (ICD-782.1)    LEG PAIN, LEFT (ICD-729.5)    CONSTIPATION, CHRONIC (ICD-564.09)    RIB PAIN, RIGHT SIDED (ICD-786.50)    HYPERPARATHYROIDISM UNSPECIFIED (ICD-252.00)    FAMILY HISTORY DIABETES 1ST DEGREE RELATIVE (ICD-V18.0)    FAMILY HISTORY BREAST CANCER 1ST DEGREE RELATIVE <50 (ICD-V16.3)    URINARY INCONTINENCE (ICD-788.30)    ANEMIA-NOS (ICD-285.9)    HYPERLIPIDEMIA (ICD-272.4)    Wrist Fracture  Past Surgical History:    Right Oophorectomy    Rt Superior Parathyroidectomy    Family History:    Family History Breast cancer 1st degree relative <50    Family History Diabetes 1st degree relative    Family History of Sudden Death father 15     Family History of Cardiovascular disorder MOM RHD 24s     No FH of Colon Cancer:  Social History:    Occupation: travel agent    Married    Never Smoked    Alcohol use-yes ocassional wine  Drug use-no    Regular exercise-no     hh of 2  no pets       Daily Caffeine Use 2-3 cups coffee in AM  Review of Systems       Pertinent positive and negative review of systems were noted in the above HPI. All other ROS was otherwise negative.   Vital Signs:  Patient profile:   70 year old female Height:      62 inches Weight:      129 pounds BMI:     23.68 BSA:     1.59 Pulse rate:   64 / minute Pulse rhythm:   regular BP sitting:   140 / 82  (left arm) Cuff size:   regular  Vitals Entered By: Francee Piccolo  CMA (February 27, 2009 4:16 PM)  Physical Exam  General:  Well developed, well nourished, no acute distress. Neck:  Supple; no masses or thyromegaly. Lungs:  Clear throughout to auscultation. Heart:  Regular rate and rhythm; no murmurs, rubs or bruits. Abdomen:  soft, nontender abdomen with normoactive bowel sounds, no distention, no bruit. Rectal:  rectal and anoscopic exam reveals decreased rectal tone, small external hemorrhoidal tags, no internal hemorrhoids. There is some retained stool in the rectal ampulla which is hemoccult negative.   Impression & Recommendations:  Problem # 1:  CONSTIPATION (ICD-564.00) chronic constipation and what sounds like a mild rectocele causing problems with evacuation. Patient needs to take a mild laxative on a daily basis rather than a strong laxative once a week. This will enable her to have more controlled bowel movements on a daily basis rather than  severe diarrhea with incontinence. We have made several suggestions to the patient. The first one is to use milk of magnesia 15-30 cc every night. The second would be to use Senokot 1-2 tablets at night. She could also use Colace twice a day. She may prefer to use glycerin suppositories to facilitate rectal evacuation. I have encouraged her to stay active physically so she can strengthen her pelvic support.I am not really sure why I am seeinh her if she has a Dr hays as her GI, having had 2, last one just last year. colonoscopies  Patient Instructions: 1)  milk of magnesia 15-30 cc q.h.s. 2)  As an alternative to Senokot, take Colace twice a day 3)  As an alternative to Colace, take Senokot 1-2 tablets daily 4)  Return in 2-3 months 5)  Repeat colonoscopy in 5 years 6)  High fiber diet and regular eating habits 7)  Copy sent to : Dr Fabian Sharp 8)  I think she ought to follow up with Dr [place] Bing

## 2010-12-20 NOTE — Assessment & Plan Note (Signed)
Summary: fu on mammogram/dexa scan/pt will come in fasting/njr   Vital Signs:  Patient profile:   70 year old female Menstrual status:  postmenopausal Weight:      129 pounds Pulse rate:   60 / minute BP sitting:   130 / 80  (left arm) Cuff size:   regular  Vitals Entered By: Romualdo Bolk, CMA (May 25, 2009 11:28 AM) CC: Follow-up visit on mmg and dexa LMP - Character: post Menarche (age onset): 12 years   days  Menstrual Status postmenopausal   History of Present Illness: [name] comes in  today  for follow up. of multiple medical issues.   Has had  UTIs   and recently another   seeing Dr Aldean Ast.     Had normal  bladder dysfunction.  On oxytrol which has helped but thinking of stopping as is doing ok .Has a stone with no signs at present GI:   Dr Juanda Chance     said no change and no  muscle tone  problems  no new problem. Senekot and MOM , Dulcolax supp.   Still no control.    Fatigue :   Energy level low some.   falls asleep early.  sleeps well otherwise  8-10 hours and refreshing  in am Nocturia x 1 .  Bp:  up and down   range rarely 130-140 ok   Preventive Screening-Counseling & Management  Alcohol-Tobacco     Alcohol drinks/day: 1     Alcohol type: wine     Smoking Status: never  Caffeine-Diet-Exercise     Caffeine use/day: 2-3     Does Patient Exercise: no  Current Medications (verified): 1)  D 1000 Plus  Tabs (Fa-Cyanocobalamin-B6-D-Ca) .... 2 Tablets By Mouth Once Daily 2)  Oxytrol 3.9 Mg/24hr Pttw (Oxybutynin) .[name].. 1 Patch Weekly 3)  Valium 2 Mg Tabs (Diazepam) .... 1/2 - 1 By Mouth Two Times A Day 4)  Flax   Oil (Flaxseed (Linseed))  Allergies (verified): No Known Drug Allergies  Past History:  Past medical, surgical, family and social histories (including risk factors) reviewed, and no changes noted (except as noted below).  Past Medical History: Seasonal Allergies G3P2  vaginal deliveries x 3  Consults Dr. Aldean Ast- urologist      Scoliosis  COLONIC POLYPS, ADENOMATOUS, HX OF (ICD-V12.72) CONSTIPATION (ICD-564.00) DIVERTICULOSIS, COLON (ICD-562.10) PARATHYROIDECTOMY (ICD-V45.79) UTI (ICD-599.0) FEVER UNSPECIFIED (ICD-780.60) NEPHROLITHIASIS, HX OF (ICD-V13.01)-hx of left not passed ABDOMINAL PAIN, UNSPECIFIED SITE (ICD-789.00) PALPITATIONS, OCCASIONAL (ICD-785.1) HYPERTENSION (ICD-401.9) OSTEOPOROSIS (ICD-733.00) RASH AND OTHER NONSPECIFIC SKIN ERUPTION (ICD-782.1) LEG PAIN, LEFT (ICD-729.5) CONSTIPATION, CHRONIC (ICD-564.09) RIB PAIN, RIGHT SIDED (ICD-786.50) HYPERPARATHYROIDISM UNSPECIFIED (ICD-252.00) FAMILY HISTORY DIABETES 1ST DEGREE RELATIVE (ICD-V18.0) FAMILY HISTORY BREAST CANCER 1ST DEGREE RELATIVE <50 (ICD-V16.3) URINARY INCONTINENCE (ICD-788.30) ANEMIA-NOS (ICD-285.9) HYPERLIPIDEMIA (ICD-272.4) Wrist Fracture  Past Surgical History: Reviewed history from 02/27/2009 and no changes required. Right Oophorectomy Rt Superior Parathyroidectomy    Past History:  Care Management: Gastroenterology: Juanda Chance Urology: Kimbrough  Family History: Reviewed history from 02/27/2009 and no changes required. Family History Breast cancer 1st degree relative <50 Family History Diabetes 1st degree relative Family History of Sudden Death father 30  Family History of Cardiovascular disorder MOM RHD 9s  No FH of Colon Cancer:  Social History: Reviewed history from 02/27/2009 and no changes required. Occupation: travel agent Married Never Smoked Alcohol use-yes ocassional wine Drug use-no Regular exercise-no  hh of 2  no pets    Daily Caffeine Use 2-3 cups coffee in AM Caffeine use/day:  2-3  Review of Systems  The patient denies anorexia, fever, weight loss, chest pain, syncope, dyspnea on exertion, peripheral edema, prolonged cough, headaches, abdominal pain, melena, hematochezia, severe indigestion/heartburn, transient blindness, difficulty walking, depression, unusual weight change, abnormal  bleeding, enlarged lymph nodes, and angioedema.         dizzy when turns to left side  for months   not alarming  getting hair washed.   someetimes hard to remember things. under a lot of stress  Physical Exam  General:  Well-developed,well-nourished,in no acute distress; alert,appropriate and cooperative throughout examination Head:  normocephalic and atraumatic.   Eyes:  vision grossly intact.  glasses Ears:  R ear normal and L ear normal.   Nose:  no external deformity and no nasal discharge.   Mouth:  pharynx pink and moist.   Neck:  No deformities, masses, or tenderness noted. Lungs:  Normal respiratory effort, chest expands symmetrically. Lungs are clear to auscultation, no crackles or wheezes.no dullness.   Heart:  Normal rate and regular rhythm. S1 and S2 normal without gallop, murmur, click, rub or other extra sounds.no lifts.   Abdomen:  Bowel sounds positive,abdomen soft and non-tender without masses, organomegaly or  noted. Pulses:  pulses intact without delay   Extremities:  no clubbing cyanosis or edema  Neurologic:  alert & oriented X3, cranial nerves II-XII intact, gait normal, and DTRs symmetrical and normal.  nl tones an no tremor  Skin:  turgor normal, color normal, no ecchymoses, and no petechiae.   Cervical Nodes:  No lymphadenopathy noted Psych:  Oriented X3, normally interactive, good eye contact, not anxious appearing, and not depressed appearing.     Impression & Recommendations:  Problem # 1:  FATIGUE (ICD-780.79) prob multifactorial  r/o metabolic  Orders: TLB-BMP (Basic Metabolic Panel-BMET) (80048-METABOL) TLB-CBC Platelet - w/Differential (85025-CBCD) TLB-Hepatic/Liver Function Pnl (80076-HEPATIC) TLB-TSH (Thyroid Stimulating Hormone) (84443-TSH) TLB-Lipid Panel (80061-LIPID) TLB-T4 (Thyrox), Free [phone]) TLB-T3, Free (Triiodothyronine) (84481-T3FREE) TLB-Sedimentation Rate (ESR) (85652-ESR) TLB-B12 + Folate Pnl  (32440_10272-Z36/UYQ) T-Parathyroid Hormone, Intact w/ Calcium (03474-25956)  Problem # 2:  CONSTIPATION (ICD-564.00) no change  no obstruction  .  see note Dr Juanda Chance.  Problem # 3:  NEPHROLITHIASIS, HX OF (ICD-V13.01)  Orders: TLB-BMP (Basic Metabolic Panel-BMET) (80048-METABOL) TLB-CBC Platelet - w/Differential (85025-CBCD) TLB-Hepatic/Liver Function Pnl (80076-HEPATIC) TLB-TSH (Thyroid Stimulating Hormone) (84443-TSH) TLB-Lipid Panel (80061-LIPID) T-Parathyroid Hormone, Intact w/ Calcium (38756-43329)  Problem # 4:  HYPERTENSION (ICD-401.9) elevated today  had become normal after parathyroidectomy . will recheck labs  and treat if needed.  Problem # 5:  OSTEOPOROSIS (ICD-733.00) improved bone density    reviewed   improved in hp t score  .   had course of bisphosphonate.   hyperparathyroid surgery  last year.  Problem # 6:  URINARY INCONTINENCE (ICD-788.30) Assessment: Improved  Problem # 7:  SCOLIOSIS (ICD-737.30) Assessment: Comment Only  Complete Medication List: 1)  D 1000 Plus Tabs (Fa-cyanocobalamin-b6-d-ca) .... 2 tablets by mouth once daily 2)  Oxytrol 3.9 Mg/24hr Pttw (Oxybutynin) .[name].. 1 patch weekly 3)  Valium 2 Mg Tabs (Diazepam) .... 1/2 - 1 by mouth two times a day 4)  Flax Oil (Flaxseed (linseed))  Patient Instructions: 1)  You will be informed of lab results when available.  2)  Check your Blood pressure readings   once a day for this week and then at least 3 /week. 3)  If 145 and over   we may need to start a medicatioin.   4)  return office visit in 1 month  or asneeded.   Appended Document: fu on mammogram/dexa scan/pt will come in fasting/njr     Serial Vital Signs/Assessments:  Time      Position  BP       Pulse  Resp  Temp     By           R Arm     160/80                         Madelin Headings MD           L Arm     160/78                         Madelin Headings MD  Comments: reg cuff sitting By: Madelin Headings MD    Allergies: No Known  Drug Allergies   Complete Medication List: 1)  D 1000 Plus Tabs (Fa-cyanocobalamin-b6-d-ca) .... 2 tablets by mouth once daily 2)  Oxytrol 3.9 Mg/24hr Pttw (Oxybutynin) .[name].. 1 patch weekly 3)  Valium 2 Mg Tabs (Diazepam) .... 1/2 - 1 by mouth two times a day 4)  Flax Oil (Flaxseed (linseed))

## 2010-12-20 NOTE — Progress Notes (Signed)
  Phone Note Call from Patient   Summary of Call: She has had steadily increasing RLQ pain today with strainig to defecate but unable to do so.  A new pain. T 99.1 F No Urinary sxs ? some back pain advised ED eval tonight ? appendicitis, diverticulitis call taken about 1 hr ago Initial call taken by: Iva Boop MD, Clementeen Graham,  January 28, 2010 7:03 PM

## 2010-12-20 NOTE — Assessment & Plan Note (Signed)
Summary: follow up/ssc   Vital Signs:  Patient Profile:   70 Years Old Female Weight:      132 pounds Pulse rate:   60 / minute BP sitting:   120 / 80  (left arm) Cuff size:   regular  Vitals Entered By: Romualdo Bolk, CMA (February 10, 2008 2:23 PM)                 Chief Complaint:  Follow up.  History of Present Illness: [name] is here for a follow up on labs. Pt reviewed medications and changes made.  1 leg pain:Saw Dr Farris Has and treated voltaren   and worked very well. for hip bursistis  2 IncontinenceSaw Urologist    and was placed on  two times a day macrobid .. for urine infection? evaluation for incontinence   .  3. HT    :BP very high at urologist    office ? from waiting  .  was in 140 range later   4.  Constipation   for 2 weeks   miralax   loose stool s   and "incontinenence " no blood  ? what to do   thnks shes doing ok otherwise PTH    : osteoporosis:   tums after parathyroid surgery because tolerates it better. LIPIDs :  not exercising could do better          Hypertension History:      She notes no problems with any antihypertensive medication side effects.        Positive major cardiovascular risk factors include female age 60 years old or older, hyperlipidemia, and hypertension.  Negative major cardiovascular risk factors include non-tobacco-user status.       Prior Medications Reviewed Using: Patient Recall  Prior Medication List:  FOSAMAX 70 MG  TABS (ALENDRONATE SODIUM) 1 by mouth every week TUMS 500 MG  CHEW (CALCIUM CARBONATE ANTACID) 1 by mouth once daily   Updated Prior Medication List: FOSAMAX 70 MG  TABS (ALENDRONATE SODIUM) 1 by mouth every week TUMS 500 MG  CHEW (CALCIUM CARBONATE ANTACID) 1 by mouth once daily DICLOFENAC SODIUM 75 MG  TBEC (DICLOFENAC SODIUM) 1 two times a day NITROFURANTOIN MACROCRYSTAL 100 MG  CAPS (NITROFURANTOIN MACROCRYSTAL) 1 two times a day x 30 days  Current Allergies (reviewed today): No  known allergies   Past Medical History:    Reviewed history from 01/01/2008 and no changes required:       Renal Stones       Seasonal Allergies       Hyperlipidemia       Anemia-NOS       Urinary incontinence       G3P2  Past Surgical History:    Reviewed history from 10/20/2007 and no changes required:       Oophorectomy       Rt Superior Parathyroidectomy   Family History:    Reviewed history from 01/01/2008 and no changes required:       Family History Breast cancer 1st degree relative <50       Family History Diabetes 1st degree relative       Family History of Sudden Death father 71        Family History of Cardiovascular disorder MOM RHD 17s   Social History:    Reviewed history from 07/07/2007 and no changes required:       Occupation:       Married       Never Smoked  Alcohol use-yes       Drug use-no       Regular exercise-no    Review of Systems  The patient denies anorexia, fever, weight loss, chest pain, dyspnea on exhertion, peripheral edema, prolonged cough, hemoptysis, muscle weakness, and abnormal bleeding.         rest of ros neg or [place]    Physical Exam  General:     Well-developed,well-nourished,in no acute distress; alert,appropriate and cooperative  Lungs:     normal respiratory effort and no intercostal retractions.   Heart:     normal rate and regular rhythm.  see bp readings Neurologic:     grossly no  focal gait ok Skin:     turgor normal and color normal.   Additional Exam:     see labs   Parathyroid Hormone, Intact with Ca (16109)   Parathyroid Hormone  [H]  95.3 pg/mL                  14.0-72.0   Calcium                   9.1 mg/dL                   6.0-45.4  Tests: (2) Vitamin D (25-Hydroxy) (09811)  Vitamin D (25-Hydroxy)                             30 ng/mL                    30-89    Impression & Recommendations:  Problem # 1:  HYPERTENSION (ICD-401.9) ? labile    ? effect of pain and meds   monitor and call if  up ok today  Problem # 2:  OSTEOPOROSIS (ICD-733.00) take vit d  Her updated medication list for this problem includes:    Fosamax 70 Mg Tabs (Alendronate sodium) .[name]... 1 by mouth every week   Problem # 3:  LEG PAIN, LEFT (ICD-729.5)  bursitis improved onnsaid   may benefir from pt to be able to improve balanc and endurance and decrease lipids.  Problem # 4:  CONSTIPATION, CHRONIC (ICD-564.09) need to back off of miralax   Problem # 5:  URINARY INCONTINENCE (ICD-788.30) poss underlying infection  to follow up urology  Problem # 6:  HYPERLIPIDEMIA (ICD-272.4) counseled about improvement in this   w  lifestyle  could consider meds  if appropriate  Problem # 7:  HYPERPARATHYROIDISM UNSPECIFIED (ICD-252.00) hx of    slightly up agin but will follow  see reults   counseled about all of above issues     50% of visit  25 minutes   Complete Medication List: 1)  Fosamax 70 Mg Tabs (Alendronate sodium) .[name].. 1 by mouth every week 2)  Tums 500 Mg Chew (Calcium carbonate antacid) .[name].. 1 by mouth once daily 3)  Diclofenac Sodium 75 Mg Tbec (Diclofenac sodium) .[name].. 1 two times a day 4)  Nitrofurantoin Macrocrystal 100 Mg Caps (Nitrofurantoin macrocrystal) .[name].. 1 two times a day x 30 days  Hypertension Assessment/Plan:      The patient's hypertensive risk group is category B: At least one risk factor (excluding diabetes) with no target organ damage.  Today's blood pressure is 120/80.  Her blood pressure goal is < 140/90.   Serial Vital Signs/Assessments:  Time      Position  BP  Pulse  Resp  Temp     By                     142/80                         Madelin Headings MD   Patient Instructions: 1)  monitor blood pressure  call if 150 or over  2)  go th pt for hip and balance training 3)  try  decreasing miralax   4)  Take vitamin d 1000iu perday 5)  try to exercise and avoid animal fats to lower cholesterol.   6)  Please schedule a follow-up appointment in 6 months.    ]

## 2010-12-20 NOTE — Progress Notes (Signed)
Summary: opinion  Phone Note Call from Patient   Caller: Patient Call For: Madelin Headings MD Summary of Call: Pt went to Dr. Scarlett Presto and her gave her 3 months of Nitrofurantin 50 mg one by mouth three times a day, and pt wants Dr. Rosezella [place] opinion of this. [phone] Initial call taken by: Lynann Beaver CMA,  February 22, 2010 9:03 AM  Follow-up for Phone Call        this is reasonable  to try for infection   suppression and control . Then she should follow up .   with them  Follow-up by: Madelin Headings MD,  February 22, 2010 11:52 AM  Additional Follow-up for Phone Call Additional follow up Details #1::        Pt aware. Additional Follow-up by: Romualdo Bolk, CMA (AAMA),  February 22, 2010 12:05 PM

## 2010-12-20 NOTE — Assessment & Plan Note (Signed)
Summary: 6 month rov/pt will come come in fasting/njr   Vital Signs:  Patient Profile:   70 Years Old Female Weight:      129 pounds Temp:     98.0 degrees F oral Pulse rate:   72 / minute BP sitting:   120 / 70  (right arm) Cuff size:   regular  Vitals Entered By: Romualdo Bolk, CMA (January 02, 2009 8:24 AM)  Menstrual History: LMP - Character: Hyst 1994 Menarche: 12                 Chief Complaint:  Hypertension Management.  History of Present Illness: [name] is here for a follow up on blood pressure osteoporosis and sp  surgery.  .BP ;; Not checking readings  has been ok fofr a while since suregery. Rarely takes valium Constipation is problematic and ? getting worse . Had colonoscopy in past with Dr Madilyn Fireman on a recall.  Osteoporosis: no fx now and not on calcium supp.       Hypertension History:      She denies headache, chest pain, palpitations, dyspnea with exertion, orthopnea, PND, peripheral edema, visual symptoms, neurologic problems, and syncope.        Positive major cardiovascular risk factors include female age 66 years old or older, hyperlipidemia, and hypertension.  Negative major cardiovascular risk factors include non-tobacco-user status.       Prior Medications Reviewed Using: Patient Recall  Updated Prior Medication List: DICLOFENAC SODIUM 75 MG  TBEC (DICLOFENAC SODIUM) 1 two times a day D 1000 PLUS  TABS (FA-CYANOCOBALAMIN-B6-D-CA)  MAGNESIUM GLUCONATE 550 MG TABS (MAGNESIUM GLUCONATE)  OXYTROL 3.9 MG/24HR PTTW (OXYBUTYNIN) 1 patch weekly VALIUM 2 MG TABS (DIAZEPAM) 1/2 - 1 by mouth two times a day PROBIOTIC  CAPS (MISC INTESTINAL FLORA REGULAT)   Current Allergies (reviewed today): No known allergies   Past Medical History:    Renal Stones    Seasonal Allergies    Hyperlipidemia    Anemia-NOS    Urinary incontinence    UTIs     G3P2  vaginal deliveries x 3     Consults    Dr. Aldean Ast- urologist       Nephrolithiasis,  hx of left  not passed     Madilyn Fireman  GI     Social History:    Occupation: travel agent    Married    Never Smoked    Alcohol use-yes ocassional wine    Drug use-no    Regular exercise-no     hh of 2  no pets           Review of Systems  The patient denies anorexia, fever, weight loss, peripheral edema, prolonged cough, melena, hematochezia, severe indigestion/heartburn, difficulty walking, abnormal bleeding, and enlarged lymph nodes.         salivary gland  under jaw swell up attimes and go down with massage.   no pain or fever. , constipation, ocass  incontinence in past month and llq tenderness at times . No blood . no fever  and no uti signs now.    Physical Exam  General:     Well-developed,well-nourished,in no acute distress; alert,appropriate and cooperative throughout examination Head:     normocephalic and atraumatic.   Eyes:     vision grossly intact, pupils equal, and pupils round.   Neck:     No deformities, masses, or tenderness noted.prominent sub mand glands salivary but no masses or renderness and no adenopatthy Lungs:  Normal respiratory effort, chest expands symmetrically. Lungs are clear to auscultation, no crackles or wheezes. Heart:     Normal rate and regular rhythm. S1 and S2 normal without gallop, murmur, click, rub or other extra sounds. Abdomen:     soft, normal bowel sounds, no hepatomegaly, and no splenomegaly.  no masses co tenderness llq  no flank pain Extremities:     no cce  Neurologic:     grossly non focal Skin:     turgor normal, color normal, no ecchymoses, and no petechiae.   Cervical Nodes:     No lymphadenopathy noted Psych:     Oriented X3, normally interactive, good eye contact, not anxious appearing, and not depressed appearing.      Impression & Recommendations:  Problem # 1:  OSTEOPOROSIS (ICD-733.00)  Orders: T-Vitamin D (25-Hydroxy) (04540-98119) T-Parathyroid Hormone, Intact w/ Calcium (14782-95621) Venipuncture  (30865) TLB-BMP (Basic Metabolic Panel-BMET) (80048-METABOL)   Problem # 2:  HYPERTENSION (ICD-401.9) seems to be resolved after   ptectomy.  Problem # 3:  CONSTIPATION, CHRONIC (ICD-564.09) getting worse now with some ? fecal  incontinence .    Orders: TLB-TSH (Thyroid Stimulating Hormone) (84443-TSH) TLB-T4 (Thyrox), Free [phone])   Problem # 4:  PARATHYROIDECTOMY (ICD-V45.79) Assessment: Comment Only  Problem # 5:  URINARY INCONTINENCE (ICD-788.30) no change   Problem # 6:  ? intermiittent salivary gland enlargement  call if persistent and progressive  no acute change today.  Complete Medication List: 1)  Diclofenac Sodium 75 Mg Tbec (Diclofenac sodium) .[name].. 1 two times a day 2)  D 1000 Plus Tabs (Fa-cyanocobalamin-b6-d-ca) 3)  Magnesium Gluconate 550 Mg Tabs (Magnesium gluconate) 4)  Oxytrol 3.9 Mg/24hr Pttw (Oxybutynin) .[name].. 1 patch weekly 5)  Valium 2 Mg Tabs (Diazepam) .... 1/2 - 1 by mouth two times a day 6)  Probiotic Caps (Misc intestinal flora regulat)  Hypertension Assessment/Plan:      The patient's hypertensive risk group is category B: At least one risk factor (excluding diabetes) with no target organ damage.  Her calculated 10 year risk of coronary heart disease is 11 %.  Today's blood pressure is 120/70.  Her blood pressure goal is < 140/90.   Patient Instructions: 1)  will let you know about labs  and then plan follow up .  2)  If unrevealing will  rec Gi consult .

## 2010-12-20 NOTE — Letter (Signed)
Summary: [facility]   Imported By: Maryln Gottron 03/01/2010 15:14:13  _____________________________________________________________________  External Attachment:    Type:   Image     Comment:   External Document

## 2010-12-20 NOTE — Letter (Signed)
Summary: [facility]  [facility]   Imported By: Maryln Gottron 02/07/2010 11:04:14  _____________________________________________________________________  External Attachment:    Type:   Image     Comment:   External Document

## 2010-12-20 NOTE — Miscellaneous (Signed)
Summary: update colonscopy  Clinical Lists Changes  Observations: Added new observation of COLONOSCOPY: Results: Diverticulosis.       Location:  [facility].    (12/25/2007 15:05)           Colonoscopy  Procedure date:  12/25/2007  Findings:      Results: Diverticulosis.       Location:  [facility].      Colonoscopy  Procedure date:  12/25/2007  Findings:      Results: Diverticulosis.       Location:  [facility].

## 2010-12-20 NOTE — Progress Notes (Signed)
Summary: lab results & ?referral  Phone Note Call from Patient Call back at [phone]   Caller: live Call For: panosh Reason for Call: Privacy/Consent Authorization Summary of Call: 1) Wants lab results 2) The growth/blocked salivary gland is driving her crazy.  Should she go on to someone else?  What kind of doctor/who would it be? Initial call taken by: Rudy Jew, RN,  January 06, 2009 9:03 AM  Follow-up for Phone Call        .tell patient that thyroid is ok and parathyroid borderline but still considered normal.    Thus  Rec GI referral for her constipation  .  I would rec she see ENT for recurrent salivary gland swelling. Please do referrals Follow-up by: Madelin Headings MD,  January 06, 2009 1:30 PM  Additional Follow-up for Phone Call Additional follow up Details #1::        Pt aware of results and wants to go ahead with referrals. Additional Follow-up by: Romualdo Bolk, CMA,  January 06, 2009 2:40 PM

## 2010-12-20 NOTE — Progress Notes (Signed)
Summary: refill  Phone Note From Pharmacy   Caller: [facility] Reason for Call: Needs renewal Details for Reason: Losartan and amlodipine Initial call taken by: Romualdo Bolk, CMA Duncan Dull),  April 30, 2010 9:54 AM  Follow-up for Phone Call        Pt does want [place] to fax them to [facility]. Pt aware that she needs to schedule a follow up appt. Follow-up by: Romualdo Bolk, CMA (AAMA),  April 30, 2010 9:56 AM    New/Updated Medications: LOSARTAN POTASSIUM 100 MG TABS (LOSARTAN POTASSIUM) one by mouth once daily AMLODIPINE BESYLATE 5 MG TABS (AMLODIPINE BESYLATE) 1 by mouth once daily Prescriptions: LOSARTAN POTASSIUM 100 MG TABS (LOSARTAN POTASSIUM) one by mouth once daily  #90 x 0   Entered by:   Romualdo Bolk, CMA (AAMA)   Authorized by:   Madelin Headings MD   Signed by:   Romualdo Bolk, CMA (AAMA) on 04/30/2010   Method used:   Faxed to ...       [facility] (mail-order)             , [place]         Ph:        Fax: [phone]   RxID:   [id number] AMLODIPINE BESYLATE 5 MG TABS (AMLODIPINE BESYLATE) 1 by mouth once daily  #90 x 0   Entered by:   Romualdo Bolk, CMA (AAMA)   Authorized by:   Madelin Headings MD   Signed by:   Romualdo Bolk, CMA (AAMA) on 04/30/2010   Method used:   Faxed to ...       [facility] (mail-order)             , [place]         Ph:        Fax: [phone]   RxID:   [id number]

## 2010-12-20 NOTE — Assessment & Plan Note (Signed)
Summary: RIGHT UNDERARM PAIN/CCM   Vital Signs:  Patient Profile:   70 Years Old Female Weight:      131 pounds Temp:     98.1 degrees F oral Pulse rate:   72 / minute BP sitting:   140 / 100  (left arm) Cuff size:   regular  Vitals Entered By: Romualdo Bolk, CMA (December 04, 2007 9:58 AM)                 Chief Complaint:  Pain in rt underarm x 1 week.  History of Present Illness: She fell on 11-30-07, fell forward and landed squarely on her chest. No head injuries. Ever since has had sharp pains in right side just below the armpit. No shoulder pain, no back or arm pain. No SOB but it hurts to take a deep breath. Advil helps.  Current Allergies (reviewed today): No known allergies   Past Medical History:    Reviewed history from 07/07/2007 and no changes required:       Renal Stones       Seasonal Allergies       Hyperlipidemia       Anemia-NOS       Urinary incontinence  Past Surgical History:    Reviewed history from 10/20/2007 and no changes required:       Oophorectomy       Rt Superior Parathyroidectomy     Review of Systems      See HPI   Physical Exam  General:     Well-developed,well-nourished,in no acute distress; alert,appropriate and cooperative throughout examination Head:     Normocephalic and atraumatic without obvious abnormalities. No apparent alopecia or balding. Chest Wall:     quite tender along right lateral ribs below the axilla, no crepitus Lungs:     Normal respiratory effort, chest expands symmetrically. Lungs are clear to auscultation, no crackles or wheezes. Heart:     Normal rate and regular rhythm. S1 and S2 normal without gallop, murmur, click, rub or other extra sounds. Msk:     right shoulder and arm and upper spine all intact Neurologic:     alert & oriented X3.      Impression & Recommendations:  Problem # 1:  RIB PAIN, RIGHT SIDED (ICD-786.50)  Orders: T-Ribs Unilateral 2 Views (71100TC)   Complete  Medication List: 1)  Fosamax 70 Mg Tabs (Alendronate sodium) .[name].. 1 by mouth every week   Patient Instructions: 1)  Use 800 mg Ibuprofen every 6 hours as needed as well as heat. Will send her directly over for Xrays of right ribs now    ]

## 2010-12-20 NOTE — Assessment & Plan Note (Signed)
Summary: follow up/ssc   Vital Signs:  Patient Profile:   70 Years Old Female Weight:      131 pounds Pulse rate:   72 / minute BP sitting:   150 / 90  (right arm) Cuff size:   regular  Vitals Entered By: Romualdo Bolk, CMA (January 01, 2008 11:29 AM)                 Chief Complaint:  Follow up.  History of Present Illness: [name] is here for a follow up on parathyroid and blood pressure. Pt also wants to discuss anxiety, incontence, constipation and rash.   The rash started right after the operation but doesn't itch. Persists on chest  and doesn't bother her otherwise.  Not  too concerned.Surgery was 09/29/07.  Lifelong constipation and had colonoscopy last week Madilyn Fireman and was ok  . He offered no ideas on constipation except miralax which has caused some incontinence in the past.    Sneeze and urge bladder issues not better after surgery  .  Medication in past has made constipation worse.  ? what to do.   Left leg and buttock area pain  for a while  uses lift for ll discrep with help but more problematic recently and limits stair climbing.     Prior Medications Reviewed Using: Patient Recall  Current Allergies (reviewed today): No known allergies   Past Medical History:    Renal Stones    Seasonal Allergies    Hyperlipidemia    Anemia-NOS    Urinary incontinence    G3P2   Family History:    Family History Breast cancer 1st degree relative <50    Family History Diabetes 1st degree relative    Family History of Sudden Death father 34     Family History of Cardiovascular disorder MOM RHD 46s   Social History:    Reviewed history from 07/07/2007 and no changes required:       Occupation:       Married       Never Smoked       Alcohol use-yes       Drug use-no       Regular exercise-no    Review of Systems  The patient denies anorexia, fever, weight loss, syncope, prolonged cough, melena, hematochezia, and hematuria.         see  hpi   Physical Exam  General:     alert, well-developed, well-nourished, and well-hydrated.   Head:     normocephalic and atraumatic.   Eyes:     vision grossly intact, pupils equal, and pupils round.   Neck:     No deformities, masses, or tenderness noted.supple, full ROM, no masses, and no thyromegaly.  well healed scar Lungs:     normal respiratory effort and no intercostal retractions.   Heart:     normal rate and regular rhythm.   Abdomen:     Bowel sounds positive,abdomen soft and nwithout masses, organomegaly or hernias noted. min tenderness lside neg slr Msk:     walks in 2 inch heels hip ? nl ROM  Hard to step up on left leg  but otherwise gait ok. no joint warmth and no redness over joints.   Pulses:     intact. Extremities:     no CCE Neurologic:     non focal no tremor or rigidity Skin:     turgor normal and color normal.  faint maculo papular rash anterior chest Cervical  Nodes:     no anterior cervical adenopathy and no posterior cervical adenopathy.   Psych:     normally interactive, good eye contact, and slightly anxious.  normal speech     Impression & Recommendations:  Problem # 1:  URINARY INCONTINENCE (ICD-788.30) stress and urge  no better after surgery foraparathyroid.   will do referral Orders: Urology Referral (Urology)   Problem # 2:  CONSTIPATION, CHRONIC (ICD-564.09) try rec try Miralax.    Problem # 3:  LEG PAIN, LEFT (ICD-729.5) has leg length discrepancy  with lift but cannot stand on left leg alone and hard to climb up stairs because of pain  comes and goes so rec see ortho at her conbenience  Problem # 4:  HYPERPARATHYROIDISM UNSPECIFIED (ICD-252.00) s/p sugery 11/08   improvede energy  will review old labs and plan follow up lab. did not address bp and anxiety directly today  but will plan follow up   Problem # 6:  RASH AND OTHER NONSPECIFIC SKIN ERUPTION (ICD-782.1) appears very non specific      minimize topicals she could be  sensitive to.   Complete Medication List: 1)  Fosamax 70 Mg Tabs (Alendronate sodium) .[name].. 1 by mouth every week 2)  Tums 500 Mg Chew (Calcium carbonate antacid) .[name].. 1 by mouth once daily  CHF Assessment/Plan:      The patient's current weight is 131 pounds.  Her previous weight was 131 pounds.     Patient Instructions: 1)  will do urology referral 2)  she will see ortho about her leg 3)  Will review her record and plans for lab and follow up . After review   rec schedule fasting labs in a month and then rov.  with lipid , tsh, calcium, bmp, cbc diff , lfts  dx 272.4, 733.90, elevated blood pressure.    ]  Appended Document: follow up/ssc Pt aware and appointments made.

## 2010-12-20 NOTE — Progress Notes (Signed)
Summary: vomiting and diarrhea  Phone Note Call from Patient   Caller: Patient Call For: Dr. Fabian Sharp Summary of Call: Pt has Vomiting and Diarrhea today......cannot come in ...due to unexpected vomiting and diarrhea.  Has Phenergan Supp.   Should she use? [phone]  No fever.  Does have chills. Initial call taken by: Lynann Beaver CMA,  January 19, 2008 4:47 PM  Follow-up for Phone Call        Per md- ok to try. Pt aware of this. Follow-up by: Romualdo Bolk, CMA,  January 19, 2008 5:09 PM

## 2010-12-20 NOTE — Progress Notes (Signed)
Summary: fever  Phone Note Call from Patient   Caller: Patient Call For: Dr. Fabian Sharp Summary of Call: Pt has had 3 doses of Bactrim and is running a fever of 100 while taking Advil. Complains of no BM since Saturday. Has epigastric pain after eating even befoe taking Bactrim. [phone] No UTI symptoms. Initial call taken by: Lynann Beaver CMA,  November 17, 2008 11:14 AM  Follow-up for Phone Call        sensitiviy patterns not avialoble yet.  Cipro 500 two times a day for 5 days disp 10   will call when  results avialbe. Follow-up by: Madelin Headings MD,  November 17, 2008 11:51 AM  Additional Follow-up for Phone Call Additional follow up Details #1::        Rx sent electronically, pt informed Additional Follow-up by: Sid Falcon LPN,  November 17, 2008 12:11 PM    New/Updated Medications: CIPRO 500 MG TABS (CIPROFLOXACIN HCL) one tab two times a day   Prescriptions: CIPRO 500 MG TABS (CIPROFLOXACIN HCL) one tab two times a day  #10 x 0   Entered by:   Sid Falcon LPN   Authorized by:   Madelin Headings MD   Signed by:   Sid Falcon LPN on 91/47/8295   Method used:   Electronically to        [facility]* (retail)       [address]       [place], [place]  [id number]       Ph: [phone]       Fax: [phone]   RxID:   [id number]

## 2010-12-20 NOTE — Progress Notes (Signed)
Summary: Phone note  Phone Note Call from Patient   Summary of Call: Patient wants a mammogram and a bone scan ordered. She goes to [facility] [phone]. Patient can be reached at [phone]. Initial call taken by: Darra Lis RMA,  April 19, 2009 10:34 AM  Follow-up for Phone Call        Pt aware that note is ready to pick up. Follow-up by: Romualdo Bolk, CMA,  April 20, 2009 10:55 AM

## 2010-12-20 NOTE — Progress Notes (Signed)
Summary: [facility]  [facility]   Imported By: Lenard Forth 01/16/2008 11:38:00  _____________________________________________________________________  External Attachment:    Type:   Image     Comment:   External Document

## 2010-12-25 ENCOUNTER — Encounter: Payer: Self-pay | Admitting: Internal Medicine

## 2010-12-26 ENCOUNTER — Encounter: Payer: Self-pay | Admitting: Internal Medicine

## 2010-12-26 ENCOUNTER — Ambulatory Visit (INDEPENDENT_AMBULATORY_CARE_PROVIDER_SITE_OTHER): Payer: Medicare Other | Admitting: Internal Medicine

## 2010-12-26 VITALS — BP 120/80 | HR 72 | Temp 97.9°F | Wt 127.0 lb

## 2010-12-26 DIAGNOSIS — K529 Noninfective gastroenteritis and colitis, unspecified: Secondary | ICD-10-CM

## 2010-12-26 DIAGNOSIS — I10 Essential (primary) hypertension: Secondary | ICD-10-CM

## 2010-12-26 DIAGNOSIS — I959 Hypotension, unspecified: Secondary | ICD-10-CM | POA: Insufficient documentation

## 2010-12-26 DIAGNOSIS — K5289 Other specified noninfective gastroenteritis and colitis: Secondary | ICD-10-CM

## 2010-12-26 DIAGNOSIS — I951 Orthostatic hypotension: Secondary | ICD-10-CM | POA: Insufficient documentation

## 2010-12-26 MED ORDER — PROMETHAZINE HCL 25 MG RE SUPP: 25.0000 mg | Freq: Four times a day (QID) | RECTAL | Status: AC | PRN

## 2010-12-26 NOTE — Assessment & Plan Note (Signed)
Currently quite orthostatic at the urgent care. Currently  Systolic is in the 115 range sitting and 110 rate standing.  No tachycardia with this. We will not restart her antihypertensive medications until appropriate and then perhaps just one at a time.

## 2010-12-26 NOTE — Assessment & Plan Note (Addendum)
Sounds acutely infectious with significant dehydration. That required IV fluids. She is much better but still tired we'll need to get copy of records and lab reports to review. Her blood pressure is still on the lower side for her and actually normal we will not  restart the antihypertensives at this time.  Consider just adding back the amlodipine in the future.   patient no longer requires an anti-medic but she asks for a prescription for the Phenergan suppository. Discussed with her the caution in elderly and  Potential for more side effects such as CNS and following. She is aware of this and doesn't plan to take this anytime soon but would like one at home...so  she can take a half if needed.  prescription given today.

## 2010-12-26 NOTE — Assessment & Plan Note (Addendum)
cCurrently quite orthostatic at the urgent care. Currently  Systolic is in the 115 range sitting and 110 rate standing.  No tachycardia with this. We will not restart her antihypertensive medications until appropriate and then perhaps just one at a time.

## 2010-12-26 NOTE — Assessment & Plan Note (Signed)
Had been quite elevated in the dentist office before this illness I was increasing the medication .  However we'll hold medication at this point and monitor.

## 2010-12-26 NOTE — Progress Notes (Signed)
  Phone Note Call from Patient   Caller: Patient Call For: Teresa Headings MD Summary of Call: Pt states her BP has been going over 200 the past few days and wants to know whether to be seen or change meds etc. During her Dentist appt , her BP was 200/100 She had stopped her med for about 3 months with normal reading and after going back on meds, it runs 180/90 and 110/66. Pt went back on  Amlodopine5mg  and Losartan 100mg . for 10 days Initial call taken by: Lynann Beaver CMA AAMA,  December 17, 2010 11:49 AM  Follow-up for Phone Call        Per Dr. Fabian Sharp- have her stay on her meds. Wait 3 more days if not going take 1 1/2 amlodopine then rov in 2 weeks. Follow-up by: Romualdo Bolk, CMA Duncan Dull),  December 17, 2010 2:38 PM  Additional Follow-up for Phone Call Additional follow up Details #1::        LM on pt's voice mail. Additional Follow-up by: Lynann Beaver CMA AAMA,  December 17, 2010 2:58 PM

## 2010-12-26 NOTE — Progress Notes (Signed)
  Subjective:    Patient ID: [name], female    DOB: 12-15-1940, 70 y.o.   MRN: [id number]  patient comes in today as followup from being seen in urgent care, not 4 days ago for acute onset of vomiting and diarrhea that was pretty severe to the point where she required IV fluids. She felt weak and dizzy there were no associated fever and no blood in the stool but it was like water she was.. She now has a little more constipation no diarrhea slight nausea no vomiting is able to take clear liquids without problem but still feels washed out. Is requesting a prescription for Phenergan suppositories just in case. She said they wouldn't give it to her in the urgent care because of potential side effects in her age group. She had a left over at home and did take one through the week and it helped her.   In regard to her hypertension her blood pressure had been elevated see note from January 30. However since her illness her blood pressure was on the low side and was orthostatic. She denies any syncope chest pain palpitations currently. She has not taken her blood pressure medicine this week.   no one else got sick she did not eat out before this. HPI    Review of Systems  see history of present illness negative for fever respiratory GU bleeding neurologic. Rest of ROS negative or noncontributory    Objective:   Physical Exam       This is a well-developed well-nourished slightly washed out lady cognitively normal who appears her stated age in no acute distress. HEENT normocephalic TMs clear eyes nonicteric nares patent OP mucous membranes moist neck supple without masses thyromegaly or bruit chest CTA No RRW..   Cardiac: S1-S2 no gallops or murmurs abdomen bowel sounds normal   No organomegaly guarding or rebound no CVA tenderness. Extremities negative CCE normal perfusion no rashes or petechiae mentation normal gait within normal limits.  BP 110/70 sitting  104/70 standing  Pulse 70 range  Both  position. Assessment & Plan:  AGE  Presumed infectious and convalescing.  HYPOTENSION better  But not  At the point would use antihypertensive meds yet Need labs to review from visit.

## 2011-01-07 ENCOUNTER — Telehealth: Payer: Self-pay | Admitting: Internal Medicine

## 2011-01-07 NOTE — Telephone Encounter (Signed)
Pt has not been on bp meds for 2wk now. bp sitting ranges  from 160-180/78-90 and standing ranges from 102-104/67. Please advise

## 2011-01-07 NOTE — Telephone Encounter (Signed)
What is her pulse  How does she feel?  Cant make a decision based on this

## 2011-01-08 ENCOUNTER — Other Ambulatory Visit: Payer: Self-pay | Admitting: *Deleted

## 2011-01-08 ENCOUNTER — Telehealth: Payer: Self-pay | Admitting: *Deleted

## 2011-01-08 ENCOUNTER — Encounter: Payer: Self-pay | Admitting: Internal Medicine

## 2011-01-08 ENCOUNTER — Ambulatory Visit (INDEPENDENT_AMBULATORY_CARE_PROVIDER_SITE_OTHER): Payer: Medicare Other | Admitting: Internal Medicine

## 2011-01-08 DIAGNOSIS — I951 Orthostatic hypotension: Secondary | ICD-10-CM

## 2011-01-08 DIAGNOSIS — I10 Essential (primary) hypertension: Secondary | ICD-10-CM

## 2011-01-08 NOTE — Assessment & Plan Note (Signed)
Higher with sitting    But only barely elevated to day   Sitting in low 140 range and 130 when standing and pulse  Ranges between 60-68  And feels well.  Plan   Cautious use of meds   Adding back if high 150 160 range.     Apparently not up all of the time.    Feels well and able to walk with out sx  And fully recovered from the AGE.   Records from urgent care not in this ehr.

## 2011-01-08 NOTE — Patient Instructions (Signed)
Check your  BP.  readings after being quiet for a few minutes.     If in the 158-160 range on a regular basis we would add  2.5  Mg    Amlodipine    and then return office visit   in   6-8 weeks.

## 2011-01-08 NOTE — Telephone Encounter (Signed)
Appt left for pt to come in to see Dr. Fabian Sharp.

## 2011-01-08 NOTE — Telephone Encounter (Signed)
LMTCB for appt.

## 2011-01-08 NOTE — Telephone Encounter (Signed)
Left appt. On pt's voice mail, and asked to cal back if she can not come.

## 2011-01-09 ENCOUNTER — Encounter: Payer: Self-pay | Admitting: Internal Medicine

## 2011-01-09 NOTE — Assessment & Plan Note (Signed)
Now off meds since  GE and dehydration    Orthostatic hypertension sitting vs standing  No sx     Want to avoid  symptoms

## 2011-01-09 NOTE — Progress Notes (Signed)
  Subjective:    Patient ID: [name], female    DOB: [birth year], 70 y.o.   MRN: [id number]  HPI Comes in today because of continued issues with BP readings.    Has been off meds since episode of acute ge and hypotension.  Since then feels much better but bp up when sitting and down when standing   No dizziness  And able to take walks and exercise and feels well.  Unsure what to do about her bp .  Didn't bring in machine today but has in the past.  Was on losartan  And norvasc before illness.   Review of Systems Neg cp sob cough Gi issues .  No dizziness now or syncope.    No bleeding    Objective:   Physical Exam Physical Exam: Vital signs reviewed  See orthostaatic changes   Pulse in 60 - 70 range  WUJ:WJXB is a well-developed well-nourished alert cooperative  white female who appears her stated age in no acute distress.  HEENT: normocephalic  traumatic , Eyes: PERRL EOM's full, conjunctiva clear, Nares: paten,t no deformity discharge or tenderness., Ears: no deformity EAC's clear TMs with normal landmarks. Mouth: clear OP, no lesions, edema.  Moist mucous membranes. Dentition in adequate repair. NECK: supple without masses, thyromegaly or bruits. CHEST/PULM:  Clear to auscultation and percussion breath sounds equal no wheeze , rales or rhonchi. No chest wall deformities or tenderness. CV: PMI is nondisplaced, S1 S2 no gallops, murmurs, rubs. Peripheral pulses are full without delay.No JVD .   See bp readings  ABDOMEN: Bowel sounds normal nontender  No guard or rebound, no hepato splenomegal no CVA tenderness.  No hernia. Extremtities:  No clubbing cyanosis or edema,  NEURO:  Oriented x3, cranial nerves 3-12 appear to be intact, no obvious focal weakness,gait within normal limits no abnormal reflexes or asymmetrical SKIN: No acute rashes normal turgor, color, no bruising or petechiae. PSYCH: Oriented, good eye contact, no obvious depression anxiety, cognition and judgment appear  normal.       dont see  Urgent care labs in the record that were requested last time.  Assessment & Plan:  Hypertension with sitting and orthostatic changes   Currently feeling ok    .  Hx of ht rx  On 2 meds   Cautious to restart as in office today they are acceptable for age .       Options discussed reviewed   Get readings after  sitting a while  And monitor pulse.  Can add back low dose amlodipine as sthis works  Slowly   .      More than 50% of visit  Was spent in counseling   And face to face    25 minutes

## 2011-01-22 ENCOUNTER — Telehealth: Payer: Self-pay | Admitting: *Deleted

## 2011-01-22 NOTE — Telephone Encounter (Signed)
Pt called back and bp laying down 169/87 and setting and standing 134/89. Pt still can't focus. She still hasn't had anything to eat. I advised pt to go get something to eat then monitor her bp tonight and call in the am with her readings.

## 2011-01-22 NOTE — Telephone Encounter (Signed)
Nausea, and dizzy and BP is 198/111, and had has not taken any of the Amlodopine, but will take it ASAP, and wants to know what else to do??? [facility]

## 2011-01-22 NOTE — Telephone Encounter (Signed)
Spoke to pt- she states that she is having nausea and very dizzy. She can't open both eyes. She has to open one at a time. She is very wobbly. Pt states that she hasn't ate at all today. Pt took 1/2 of norvasc at 2:09. Pt hasn't taken any phenergan. I told her to lay down and relax then call us back with her bp reading around 3:00-3:15pm

## 2011-01-23 ENCOUNTER — Telehealth: Payer: Self-pay | Admitting: *Deleted

## 2011-01-23 DIAGNOSIS — I10 Essential (primary) hypertension: Secondary | ICD-10-CM

## 2011-01-23 NOTE — Telephone Encounter (Signed)
Not as dizzy today but still having some dizziness, Eyes are focusing, BP standing is 91/60 and setting is 109/60. She hasn't taken the Norvasc today and is willing to go to a cardiologist if Dr. Fabian Sharp thinks she should.

## 2011-01-23 NOTE — Telephone Encounter (Signed)
Per Dr. Fabian Sharp- refer to cardiology. Pt aware of this. Order sent to [facility].

## 2011-02-11 ENCOUNTER — Encounter: Payer: Self-pay | Admitting: *Deleted

## 2011-02-11 LAB — URINE MICROSCOPIC-ADD ON

## 2011-02-11 LAB — URINALYSIS, ROUTINE W REFLEX MICROSCOPIC
Bilirubin Urine: NEGATIVE
Glucose, UA: NEGATIVE mg/dL
Hgb urine dipstick: NEGATIVE
Ketones, ur: 40 mg/dL — AB
Nitrite: POSITIVE — AB
Protein, ur: NEGATIVE mg/dL
Specific Gravity, Urine: 1.016 (ref 1.005–1.030)
Urobilinogen, UA: 1 mg/dL (ref 0.0–1.0)
pH: 7 (ref 5.0–8.0)

## 2011-02-11 LAB — URINE CULTURE: Colony Count: >=100000

## 2011-02-13 ENCOUNTER — Ambulatory Visit (INDEPENDENT_AMBULATORY_CARE_PROVIDER_SITE_OTHER): Payer: Medicare Other | Admitting: Internal Medicine

## 2011-02-13 ENCOUNTER — Encounter: Payer: Self-pay | Admitting: Internal Medicine

## 2011-02-13 VITALS — BP 144/80 | HR 66 | Temp 98.8°F | Wt 128.0 lb

## 2011-02-13 DIAGNOSIS — R300 Dysuria: Secondary | ICD-10-CM

## 2011-02-13 DIAGNOSIS — I10 Essential (primary) hypertension: Secondary | ICD-10-CM

## 2011-02-13 DIAGNOSIS — N390 Urinary tract infection, site not specified: Secondary | ICD-10-CM

## 2011-02-13 DIAGNOSIS — I951 Orthostatic hypotension: Secondary | ICD-10-CM

## 2011-02-13 LAB — POCT URINALYSIS DIPSTICK
Bilirubin, UA: NEGATIVE
Blood, UA: NEGATIVE
Glucose, UA: NEGATIVE
Ketones, UA: NEGATIVE
Nitrite, UA: NEGATIVE
Protein, UA: NEGATIVE
Spec Grav, UA: 1.015
Urobilinogen, UA: 1
pH, UA: 6

## 2011-02-13 MED ORDER — CIPROFLOXACIN HCL 500 MG PO TABS: 500.0000 mg | ORAL_TABLET | Freq: Two times a day (BID) | ORAL | Status: AC

## 2011-02-13 NOTE — Patient Instructions (Signed)
Take antibiotic   Will let you know of culture results If not better then we may get urology to  See you.

## 2011-02-13 NOTE — Progress Notes (Signed)
  Subjective:    Patient ID: [name], female    DOB: 1940-12-02, 70 y.o.   MRN: [id number]  HPI Patient comes in for a few days of burning with urination perhaps on the outside but no vaginal discharge. She is usingotc miconazole  without help Hurts on outside and harder to urinate an stomach growling. Stable consitpation .  She's not going more frequently but feels like his hard empty her bladder. She is unsure if it's a urinary tract infection. There is no hematuria she did have some right side pain that is resolved. No nausea vomiting or diarrhea.    Review of Systems No fever chills. Still has some occasional nausea she is holding her blood pressure medication and it seems to be better. She does have a cardiology appointment wonders if she should keep it.  Past Medical History  Diagnosis Date  . HYPERPARATHYROIDISM UNSPECIFIED 10/20/2007  . HYPERLIPIDEMIA 07/07/2007  . ANEMIA-NOS 07/07/2007  . HYPERTENSION 02/04/2008  . DIVERTICULOSIS, COLON 02/23/2009  . UTI'S, RECURRENT 11/20/2009  . OSTEOPOROSIS 01/01/2008  . SCOLIOSIS 05/25/2009  . FATIGUE 05/25/2009  . PALPITATIONS, OCCASIONAL 07/12/2008  . URINARY INCONTINENCE 07/07/2007  . ADVERSE REACTION TO MEDICATION 07/31/2009  . COLONIC POLYPS, ADENOMATOUS, HX OF 02/23/2009  . NEPHROLITHIASIS, HX OF 11/15/2008  . PARATHYROIDECTOMY 01/02/2009  . Seasonal allergies   . Unspecified constipation   . Other acquired absence of organ     parathyroidectomy  . Fever, unspecified   . Abdominal pain, unspecified site   . Disturbance of skin sensation   . Pain in limb   . Chest pain, unspecified   . Family history of diabetes mellitus   . Family history of malignant neoplasm of breast    Past Surgical History  Procedure Date  . Right oophorectomy   . Parathyroidectomy     Rt Superior open neck exploration    reports that she has never smoked. She does not have any smokeless tobacco history on file. She reports that she drinks alcohol. She reports  that she does not use illicit drugs. family history includes Breast cancer in her other; Diabetes in an unspecified family member; and Sudden death (age of onset:48) in her father. Allergies  Allergen Reactions  . Lisinopril     REACTION: cough       Objective:   Physical Exam Well-developed well-nourished in no acute distress chest CTAP it's equal cardiac S1-S2 no gallops murmurs peripheral pulses present without delay. No CVA pain abdomen soft without organomegaly guarding or rebound she has some mild tenderness in the suprapubic area. No psoas sign. UA shows 2+ white cells.       Assessment & Plan:  Dysuria  sort of atypical for a UTI but she has had urinary tract infections before declined a vaginal exam today. We'll do a urine culture and treat as such. If she has persistent and progressive symptoms consider urologic recheck. She has seen Dr. Aldean Ast in the past.  Labile elevated blood pressure.  Patient states it is better cover up a little bit today  but agreed to go slow and minimize medication use  it is unclear to me if she needs to be on medicine or not but her blood pressures were in the 160+ range when she began medication. We want to have you avoid hypotension.    Recommend go ahead and keep the cardiology appointment at this point and recommendations about medications and blood pressure readings.

## 2011-02-14 ENCOUNTER — Encounter: Payer: Self-pay | Admitting: Internal Medicine

## 2011-02-15 LAB — URINE CULTURE: Colony Count: >=100000

## 2011-02-15 NOTE — Progress Notes (Signed)
Pt aware of results 

## 2011-02-17 ENCOUNTER — Encounter: Payer: Self-pay | Admitting: Internal Medicine

## 2011-02-25 ENCOUNTER — Ambulatory Visit (INDEPENDENT_AMBULATORY_CARE_PROVIDER_SITE_OTHER): Payer: Medicare Other | Admitting: Cardiology

## 2011-02-25 ENCOUNTER — Encounter: Payer: Self-pay | Admitting: Cardiology

## 2011-02-25 VITALS — BP 156/84 | HR 52 | Ht 62.0 in | Wt 126.0 lb

## 2011-02-25 DIAGNOSIS — I10 Essential (primary) hypertension: Secondary | ICD-10-CM

## 2011-02-25 DIAGNOSIS — E785 Hyperlipidemia, unspecified: Secondary | ICD-10-CM

## 2011-02-25 DIAGNOSIS — E7800 Pure hypercholesterolemia, unspecified: Secondary | ICD-10-CM

## 2011-02-25 NOTE — Assessment & Plan Note (Signed)
In 10/11, LDL was quite high but HDL looked good.  Given her family history (father with sudden cardiac death at age 71), would be inclined to treat LDL if repeat lipids are similar.  I will have her get repeat fasting lipids.  Also given the family history, would start ASA 81 mg daily.

## 2011-02-25 NOTE — Patient Instructions (Signed)
Start losartan 50mg  daily. You can take 1/2 of a 100mg  tablet daily.  Start Aspirin 81mg  daily--this should be enteric coated.  Schedule an appt for an echocardiogram.  Schedule an appt for fasting lab when you have the echocardiogram. Lipid profile/BMP 401.9  272.0  Schedule an appt to see Dr Shirlee Latch in 1 month. Take and record your blood pressure about every other day. Bring these readings when you see Dr Shirlee Latch in 1 month.

## 2011-02-25 NOTE — Assessment & Plan Note (Signed)
BP is elevated today.  Patient is off all BP meds due to orthostatic intolerance.  No lightheadedness currently.  Amlodipine seems to be the worst offender regarding orthostatic symptoms for her.  I will have her start back on losartan 50 mg daily with BMET in 2 weeks.  She will check her BP every other day and record.  She will let us know if she develops orthostatic intolerance.  Given long-standing HTN, I will get an echocardiogram to assess for LVH.

## 2011-02-25 NOTE — Progress Notes (Signed)
PCP: Dr. Fabian Sharp  70 yo with history of HTN and hyperlipidemia presents for evaluation of labile blood pressure.  Patient was diagnosed with HTN about 2 years ago.  At one time, she took amlodipine 5 mg daily and losartan 100 mg daily but stopped both due to orthostatic symptoms.  Now, she takes amlodipine when her SBP is > 158 but gets lightheaded with SBP around 100 if she takes amlodipine regularly.  She has never passed out or fallen.  She has had some nausea on and off for the last 2 months and has had a UTI and recurrent UTI over the last month or so (E.coli).  She is not currently getting lightheadedness with standing.  No chest pain.  No exertional dyspnea unless she walks up a hill.  She has never smoked.  Her father had what sounds like sudden cardiac death at age 31. BP is 156/84 today, was 144/80 when she saw Dr. Fabian Sharp last.   ECG: NSR, normal  Labs (10/11): K 4.5, creatinine 0.8, HDL 62, LDL 157, TSH normal  PMH: 1. HTN: x 2 years at least.  Cough with ACEI.   She has had a history of orthostatic symptoms.  Echo (7/07): EF 55%, mild focal basal septal hypertrophy, mild LAE.  2. Colonic polyps 3. IBS.  4. H/o hyperparathyroidism s/p parathyroidectomy 5. Osteoporosis 6. Hyperlipidemia 7. IBS 8. History of recurrent UTIS  SH: Nonsmoker.  Married, lives in [place].  Son lives with her.  Works as travel Water quality scientist.   FH: Father with sudden cardiac death at age 15.  Mother with rheumatic heart disease, had valve replacements.  Sister is healthy.  ROS: All systems reviewed and negative except as per HPI.   Current Outpatient Prescriptions  Medication Sig Dispense Refill  . aspirin 81 MG EC tablet Take 81 mg by mouth daily.        [name] losartan (COZAAR) 50 MG tablet       . DISCONTD: diazepam (VALIUM) 2 MG tablet Take 2 mg by mouth every 6 (six) hours as needed. 1/2 to 1 bid       . DISCONTD: Flaxseed, Linseed, (FLAX SEED OIL PO) Take by mouth.        . DISCONTD: losartan (COZAAR) 100  MG tablet Take 100 mg by mouth daily.        [name] DISCONTD: Vitamin D, Cholecalciferol, 400 UNITS CHEW Chew by mouth.          BP 156/84  Pulse 52  Ht 5\' 2"  (1.575 m)  Wt 126 lb (57.153 kg)  BMI 23.05 kg/m2 General: NAD Neck: No JVD, no thyromegaly or thyroid nodule.  Lungs: Clear to auscultation bilaterally with normal respiratory effort. CV: Nondisplaced PMI.  Heart regular S1/S2, no S3/S4, no murmur.  No peripheral edema.  No carotid bruit.  Normal pedal pulses.  Abdomen: Soft, nontender, no hepatosplenomegaly, no distention.  Skin: Intact without lesions or rashes.  Neurologic: Alert and oriented x 3.  Psych: Normal affect. Extremities: No clubbing or cyanosis.  HEENT: Normal.

## 2011-03-04 ENCOUNTER — Telehealth: Payer: Self-pay | Admitting: *Deleted

## 2011-03-04 NOTE — Telephone Encounter (Signed)
Pt called stating that she is having a pain right under the right breast. She can't lift anything. This has been going on x 1 week. No SOB, no numbness or tingling down arms or legs. Pt states that she could have pulled a muscle there. Pt is going to take motrin to see if this helps. But if she gets worse, she will call us about this.

## 2011-03-11 ENCOUNTER — Encounter: Payer: Self-pay | Admitting: Internal Medicine

## 2011-03-11 ENCOUNTER — Ambulatory Visit (INDEPENDENT_AMBULATORY_CARE_PROVIDER_SITE_OTHER): Payer: Medicare Other | Admitting: Internal Medicine

## 2011-03-11 VITALS — BP 110/78 | HR 60 | Wt 129.0 lb

## 2011-03-11 DIAGNOSIS — I10 Essential (primary) hypertension: Secondary | ICD-10-CM

## 2011-03-11 DIAGNOSIS — R0789 Other chest pain: Secondary | ICD-10-CM | POA: Insufficient documentation

## 2011-03-11 DIAGNOSIS — E213 Hyperparathyroidism, unspecified: Secondary | ICD-10-CM

## 2011-03-11 DIAGNOSIS — M810 Age-related osteoporosis without current pathological fracture: Secondary | ICD-10-CM

## 2011-03-11 DIAGNOSIS — I951 Orthostatic hypotension: Secondary | ICD-10-CM

## 2011-03-11 DIAGNOSIS — R071 Chest pain on breathing: Secondary | ICD-10-CM

## 2011-03-11 NOTE — Patient Instructions (Addendum)
Get a bone density  DEXA scan  To check Take Vitamin D [phone] per day . Get enough calcium to equal  About 1200 mg per day.  Weight bearing exercise  Helps bone strength . Get a copy of your last bone density. Call if rib pain not getting better .  Follow up depending on results.

## 2011-03-11 NOTE — Progress Notes (Signed)
Subjective:    Patient ID: [name], female    DOB: May 17, 1941, 70 y.o.   MRN: [id number]  HPI Patient comes in for followup of multiple medical problems. Since her last visit her urinary tract symptoms responded well to Cipro and they have resolved. She is seeing Dr. Jearld Pies for her labile hypertension  she is off the amlodipine and is taking 50 mg of losartan and day.. she is checking her blood pressure readings every other day and yesterday it was 178/111. She still feels all right no fainting or lightheadedness when she stands. She is to get an echocardiogram and laboratory studies with lipids for her followup visit in May.  2 weeks ago when she was reaching for something some furniture hit her right rib cage and there was no pop but soon after that she began having severe pain that radiated around her front and kept her from reaching. It is a lot better now. No crepitus new shortness of breath or cough. No breast symptoms  She has a history of osteoporosis as an is not taking calcium and vitamin D she believes her last bone density was over 2-3 years ago.   Review of Systems No left-sided chest pain shortness of breath edema cognitive changes bleeding. Rest as per history of present illness are maintained  Past Medical History  Diagnosis Date  . HYPERPARATHYROIDISM UNSPECIFIED 10/20/2007  . HYPERLIPIDEMIA 07/07/2007  . ANEMIA-NOS 07/07/2007  . HYPERTENSION 02/04/2008  . DIVERTICULOSIS, COLON 02/23/2009  . UTI'S, RECURRENT 11/20/2009    kimbrough   . OSTEOPOROSIS 01/01/2008  . SCOLIOSIS 05/25/2009  . FATIGUE 05/25/2009  . PALPITATIONS, OCCASIONAL 07/12/2008  . URINARY INCONTINENCE 07/07/2007  . ADVERSE REACTION TO MEDICATION 07/31/2009  . COLONIC POLYPS, ADENOMATOUS, HX OF 02/23/2009  . NEPHROLITHIASIS, HX OF 11/15/2008  . PARATHYROIDECTOMY 01/02/2009  . Seasonal allergies   . Unspecified constipation   . Other acquired absence of organ     parathyroidectomy  . Fever, unspecified   .  Abdominal pain, unspecified site   . Disturbance of skin sensation   . Pain in limb   . Chest pain, unspecified   . Family history of diabetes mellitus   . Family history of malignant neoplasm of breast    Past Surgical History  Procedure Date  . Right oophorectomy   . Parathyroidectomy     Rt Superior open neck exploration    reports that she has never smoked. She has never used smokeless tobacco. She reports that she drinks alcohol. She reports that she does not use illicit drugs. family history includes Breast cancer in her other; Diabetes in an unspecified family member; and Sudden death (age of onset:48) in her father. Allergies  Allergen Reactions  . Lisinopril     REACTION: cough       Objective:   Physical Exam Well-developed well-nourished in no acute distress looks well today. HEENT grossly nl  Neck NO bruits or masses  Chest:  Clear to A&P without wheezes rales or rhonchi CV:  S1-S2 no gallops or murmurs peripheral perfusion is normal There is some tenderness on the right lateral rib about T8 T7 but no crepitus or bony abnormality breast exam is normal. Abdomen:  Sof,t normal bowel sounds without hepatosplenomegaly, no guarding rebound or masses no CVA tenderness No clubbing cyanosis or edema peripheral pulses seem equal. Neurologic nonfocal oriented x3 normal cognition and speech       Assessment & Plan:  Labile hypertension with history of postural hypotension. Readings today  were good but she is getting very elevated ones at home. Since she is off the amlodipine Followup with cardiology. Right chest wall pain new problem: believed to be related to ribs. We discussed possibility of getting an x-ray but since it is getting better we will wait. Osteoporosis    unsure when her last bone density was.  She had had significant hypoparathyroidism that in the past was a risk factor. Her last vitamin D level uric was in the 40 range although she is not taking calcium  vitamin D.  Her last calcium level was normal and her PTH level was borderline. Recommend we repeat bone density she take regular vitamin D weightbearing exercises and then plan appropriate followup. UTI with history of same resolved .    Treat prn . Had been on suppressive therapy per uro in the past .

## 2011-03-11 NOTE — Assessment & Plan Note (Signed)
Stable

## 2011-03-25 ENCOUNTER — Encounter: Payer: Self-pay | Admitting: Internal Medicine

## 2011-03-26 ENCOUNTER — Encounter: Payer: Self-pay | Admitting: Internal Medicine

## 2011-03-27 ENCOUNTER — Inpatient Hospital Stay: Admission: RE | Admit: 2011-03-27 | Payer: Medicare Other | Source: Ambulatory Visit

## 2011-03-27 ENCOUNTER — Ambulatory Visit (INDEPENDENT_AMBULATORY_CARE_PROVIDER_SITE_OTHER)
Admission: RE | Admit: 2011-03-27 | Discharge: 2011-03-27 | Disposition: A | Discharge status: Home / Self Care | Payer: Medicare Other | Source: Ambulatory Visit

## 2011-03-27 DIAGNOSIS — M810 Age-related osteoporosis without current pathological fracture: Secondary | ICD-10-CM

## 2011-03-28 ENCOUNTER — Ambulatory Visit (HOSPITAL_COMMUNITY): Payer: Medicare Other | Attending: Internal Medicine | Admitting: Radiology

## 2011-03-28 ENCOUNTER — Other Ambulatory Visit (INDEPENDENT_AMBULATORY_CARE_PROVIDER_SITE_OTHER): Payer: Medicare Other | Admitting: Cardiology

## 2011-03-28 ENCOUNTER — Other Ambulatory Visit (INDEPENDENT_AMBULATORY_CARE_PROVIDER_SITE_OTHER): Payer: Medicare Other | Admitting: *Deleted

## 2011-03-28 DIAGNOSIS — E7800 Pure hypercholesterolemia, unspecified: Secondary | ICD-10-CM

## 2011-03-28 DIAGNOSIS — I10 Essential (primary) hypertension: Secondary | ICD-10-CM

## 2011-03-28 DIAGNOSIS — I059 Rheumatic mitral valve disease, unspecified: Secondary | ICD-10-CM

## 2011-03-28 DIAGNOSIS — Z13220 Encounter for screening for lipoid disorders: Secondary | ICD-10-CM

## 2011-03-28 LAB — BASIC METABOLIC PANEL
BUN: 15 mg/dL (ref 6–23)
CO2: 29 mEq/L (ref 19–32)
Calcium: 9.1 mg/dL (ref 8.4–10.5)
Chloride: 110 mEq/L (ref 96–112)
Creatinine, Ser: 0.7 mg/dL (ref 0.4–1.2)
GFR: 92.21 mL/min (ref >60.00)
Glucose, Bld: 87 mg/dL (ref 70–99)
Potassium: 4.4 mEq/L (ref 3.5–5.1)
Sodium: 145 mEq/L (ref 135–145)

## 2011-03-28 LAB — LIPID PANEL
Cholesterol: 228 mg/dL — ABNORMAL HIGH (ref 0–200)
HDL: 65.9 mg/dL (ref >39.00)
Total CHOL/HDL Ratio: 3
Triglycerides: 41 mg/dL (ref 0.0–149.0)
VLDL: 8.2 mg/dL (ref 0.0–40.0)

## 2011-03-28 LAB — LDL CHOLESTEROL, DIRECT: Direct LDL: 156.3 mg/dL

## 2011-04-02 ENCOUNTER — Encounter: Payer: Self-pay | Admitting: Cardiology

## 2011-04-02 NOTE — Op Note (Signed)
[name], [name]                ACCOUNT NO.:  [id number]   MEDICAL RECORD NO.:  [id number]          PATIENT TYPE:  AMB   LOCATION:  DAY                          FACILITY:  [facility]   PHYSICIAN:  Teresa Heckler, MD      DATE OF BIRTH:  04/12/1941   DATE OF PROCEDURE:  09/29/2007  DATE OF DISCHARGE:                               OPERATIVE REPORT   PREOPERATIVE DIAGNOSIS:  Primary hyperparathyroidism.   POSTOPERATIVE DIAGNOSIS:  Primary hyperparathyroidism.   PROCEDURE:  1. Neck exploration.  2. Right superior parathyroidectomy.   SURGEON:  [name], M.D., FACS   ANESTHESIA:  General per [name]. [name], M.D.   ESTIMATED BLOOD LOSS:  Minimal.   PREPARATION:  Betadine.   COMPLICATIONS:  None.   INDICATIONS:  The patient is a 70 year old white female patient of Dr.  Berniece Stein diagnosed with primary hyperparathyroidism.  The patient  has had a history of fractures at the wrist and the ankle.  She has  osteoporosis by bone scanning.  She has had nephrolithiasis.  Localization studies, however, failed to identify a parathyroid adenoma.  However, biochemical markers were convincing for the diagnosis, and the  patient now comes to surgery for neck exploration and parathyroidectomy.   DESCRIPTION OF PROCEDURE:  The procedure was done in OR #6 at the [facility].  The patient was brought to the operating room  and placed in a supine position on the operating room table.  Following  administration of general anesthesia, the patient was positioned and  then prepped and draped in the usual strict aseptic fashion.   After ascertaining that an adequate level of anesthesia had been  obtained, a Kocher incision was made with a #15 blade.  Dissection was  carried down through platysma.  Hemostasis was obtained with the  electrocautery.  Skin flaps were elevated cephalad and caudad from the  thyroid notch to the sternal notch.  A Mahorner self-retaining  retractor  was placed for exposure.  The strap muscles were incised in the midline.  The left side was initially explored.  Exploration along the left side  of the neck revealed what appeared to be a normal superior parathyroid  gland.  There were some enlarged lymph nodes located inferiorly.  There  was an enlarged lymph node in the central compartment which was  submitted only for permanent sectioning.  There were two lymph nodes in  the thyrothymic tract on the left which were excised and submitted to  pathology.  These proved to be benign lymph nodes.   Next, we turned our attention to the right side of the neck.  The right  lobe was mobilized.  Venous tributaries were divided between small and  medium Ligaclips.  At the inferior pole of the thyroid was a normal  parathyroid gland.  Further exploration superiorly revealed an  abnormally enlarged parathyroid gland which was deep in the  tracheoesophageal groove just at the level of the recurrent nerve.  With  some difficulty, it was gently dissected away from the cricothyroideus  muscle.  The vascular pedicles  were divided between small Ligaclips.  The gland was mobilized and completely excised.  It measured less than a  centimeter in diameter.  It was submitted to pathology where frozen  section confirmed hyperplastic parathyroid tissue consistent with  parathyroid adenoma.  Surgicel was placed in the operative field.  Good  hemostasis was noted.  The strap muscles were reapproximated in the  midline with interrupted 3-0 Vicryl sutures.  The platysma was closed  with interrupted 3-0 Vicryl sutures.  The skin was closed with a running  4-0 Monocryl subcuticular suture.  The wound was washed and dried, and  benzoin and Steri-Strips were applied.  Sterile dressings were applied.   The patient was awakened from anesthesia and brought to the recovery  room in stable condition.  The patient tolerated the procedure well.      Teresa Heckler, MD  Electronically Signed     TMG/MEDQ  D:  09/29/2007  T:  09/29/2007  Job:  [id number]   cc:   Teresa Mends. Fabian Sharp, MD  [address] [place], [place] [address]

## 2011-04-03 ENCOUNTER — Encounter: Payer: Self-pay | Admitting: Cardiology

## 2011-04-03 ENCOUNTER — Ambulatory Visit (INDEPENDENT_AMBULATORY_CARE_PROVIDER_SITE_OTHER): Payer: Medicare Other | Admitting: Cardiology

## 2011-04-03 DIAGNOSIS — E7800 Pure hypercholesterolemia, unspecified: Secondary | ICD-10-CM

## 2011-04-03 DIAGNOSIS — E785 Hyperlipidemia, unspecified: Secondary | ICD-10-CM

## 2011-04-03 DIAGNOSIS — I10 Essential (primary) hypertension: Secondary | ICD-10-CM

## 2011-04-03 MED ORDER — CHLORTHALIDONE 25 MG PO TABS: ORAL_TABLET | ORAL | Status: DC

## 2011-04-03 MED ORDER — ATORVASTATIN CALCIUM 20 MG PO TABS: 20.0000 mg | ORAL_TABLET | Freq: Every day | ORAL | Status: DC

## 2011-04-03 MED ORDER — POTASSIUM CHLORIDE CRYS ER 20 MEQ PO TBCR: 20.0000 meq | EXTENDED_RELEASE_TABLET | Freq: Two times a day (BID) | ORAL | Status: DC

## 2011-04-03 NOTE — Progress Notes (Signed)
PCP: Dr. Fabian Sharp   70 yo with history of HTN and hyperlipidemia returns for evaluation of labile blood pressure. Patient was diagnosed with HTN about 2 years ago. At one time, she took amlodipine 5 mg daily and losartan 100 mg daily but stopped both due to orthostatic symptoms.   Her blood pressure had been running high, and at last appointment, I had her start on losartan 50 mg daily.  She is not currently getting lightheadedness with standing. No chest pain. No exertional dyspnea unless she walks up a hill. She has never smoked. Her father had what sounds like sudden cardiac death at age 67.  Patient's BP is still high, running 150s-180s/80s-110s.  LDL was high at 156.  Echo showed preserved LV systolic function with moderate diastolic dysfunction likely due to HTN.    Labs (10/11): K 4.5, creatinine 0.8, HDL 62, LDL 157, TSH normal  Labs (5/12): K 4.4, creatinine 0.7, LDL 156, HDL 66  PMH:  1. HTN: x 2 years at least. Cough with ACEI. She has had a history of orthostatic symptoms. Echo (5/12): EF 60-65%, mild focal basal septal hypertrophy, grade II diastolic dysfunction, mild MR.  2. Colonic polyps  3. IBS.  4. H/o hyperparathyroidism s/p parathyroidectomy  5. Osteoporosis  6. Hyperlipidemia  7. IBS  8. History of recurrent UTIS   SH: Nonsmoker. Married, lives in [place]. Son lives with her. Works as travel Water quality scientist.   FH: Father with sudden cardiac death at age 33. Mother with rheumatic heart disease, had valve replacements. Sister is healthy.   ROS: All systems reviewed and negative except as per HPI.   Current Outpatient Prescriptions  Medication Sig Dispense Refill  . losartan (COZAAR) 100 MG tablet Take 50 mg by mouth daily.        [name] aspirin 81 MG EC tablet Take 81 mg by mouth daily.        [name] atorvastatin (LIPITOR) 20 MG tablet Take 1 tablet (20 mg total) by mouth daily.  30 tablet  3  . chlorthalidone (HYGROTON) 25 MG tablet Take 1/2 tablet daily  15 tablet  6  . potassium  chloride SA (KLOR-CON M20) 20 MEQ tablet Take 1 tablet (20 mEq total) by mouth 2 (two) times daily.  30 tablet  6  . DISCONTD: losartan (COZAAR) 50 MG tablet Take 50 mg by mouth daily.         BP 162/88  Pulse 64  Ht 5\' 2"  (1.575 m)  Wt 128 lb 12.8 oz (58.423 kg)  BMI 23.56 kg/m2 General: NAD Neck: No JVD, no thyromegaly or thyroid nodule.  Lungs: Clear to auscultation bilaterally with normal respiratory effort. CV: Nondisplaced PMI.  Heart regular S1/S2, no S3, +S4, no murmur.  No peripheral edema.  No carotid bruit.  Normal pedal pulses.  Abdomen: Soft, nontender, no hepatosplenomegaly, no distention.  Neurologic: Alert and oriented x 3.  Psych: Normal affect. Extremities: No clubbing or cyanosis.

## 2011-04-03 NOTE — Assessment & Plan Note (Signed)
Given her family history (father with sudden cardiac death at age 71), I would be inclined to treat her elevated LDL.  She will start Lipitor 20 mg daily.  Lipids/LFTs in 2 months.    Start ASA 81 mg daily given family history and vascular risk factors.

## 2011-04-03 NOTE — Patient Instructions (Addendum)
Start Chlorthalidone 12.5mg  daily. This will be 1/2 of a 25mg  tablet daily.  Start KCL(potassium) 20 mEq daily.  Start Atorvastatin 20mg  daily.  Tame Aspirin 81mg  daily--this should be enteric coated.  Lab in 2 weeks--BMP/BNP 401.9  272.0  Take and record your blood pressure. I will call you in about 2 weeks to get the readings.  Fasting lab in 2 months--lipid profile/liver profile 272.0  401.9  Schedule an appointment to see Dr Shirlee Latch in 3 months.

## 2011-04-03 NOTE — Assessment & Plan Note (Signed)
BP is still high.  Echo with LVH and grade II diastolic dysfunction suggests some effect on heart from elevated BP.  She had trouble with amlodipine in the past (orthostatic symptoms).  I will add chlorthalidone 12.5 mg daily + 20 mEq daily KCl to her regimen.  Will call her for a BP check in 2 weeks and will get BMET in 2 weeks.  She should continue losartan.

## 2011-04-05 NOTE — Op Note (Signed)
   [name], [name]                            ACCOUNT NO.:  [id number]   MEDICAL RECORD NO.:  [id number]                   PATIENT TYPE:  AMB   LOCATION:  ENDO                                 FACILITY:  [facility]   PHYSICIAN:  John C. Madilyn Fireman, M.D.                 DATE OF BIRTH:  06/29/1941   DATE OF PROCEDURE:  06/17/2003  DATE OF DISCHARGE:                                 OPERATIVE REPORT   PROCEDURE:  Colonoscopy with polypectomy.   INDICATIONS FOR PROCEDURE:  Colon cancer screening and chronic constipation.   DESCRIPTION OF PROCEDURE:  The patient was placed in the left lateral  decubitus position then placed on the pulse monitor with continuous low flow  oxygen delivered by nasal cannula. She was sedated with 50 mcg IV fentanyl  and 6 mg IV Versed. The Olympus video colonoscope was inserted into the  rectum and advanced to the cecum, confirmed by transillumination at  McBurney's point and visualization of the ileocecal valve and appendiceal  orifice. The prep was excellent. At the appendiceal orifice in the base of  the cecum, there was a small multilobular area of tissue consistent in  appearance with villous adenoma. This was essentially at appendiceal  orifice. One hot biopsy was performed and then the remaining visible  elements carefully fulgurated with the closed hot biopsy forceps. The  remainder of the cecum, ascending, transverse, descending and sigmoid colon  all appeared normal with no masses, polyps, diverticula or other mucosal  abnormalities. The scope was then withdrawn and the patient returned to the  recovery room in stable condition. She tolerated the procedure well and  there were no immediate complications.   IMPRESSION:  Cecal polyp at base of the appendix.   PLAN:  Await histology.                                               John C. Madilyn Fireman, M.D.    J[facility]/MEDQ  D:  06/17/2003  T:  06/17/2003  Job:  [id number]   cc:   Andres Ege, M.D.  [address], [address]  [place]  [place] [address]  Fax: [phone]

## 2011-04-11 ENCOUNTER — Telehealth: Payer: Self-pay | Admitting: *Deleted

## 2011-04-11 NOTE — Telephone Encounter (Signed)
HYPERTENSION - Marca Ancona, MD 04/03/2011 4:44 PM Signed  BP is still high. Echo with LVH and grade II diastolic dysfunction suggests some effect on heart from elevated BP. She had trouble with amlodipine in the past (orthostatic symptoms). I will add chlorthalidone 12.5 mg daily + 20 mEq daily KCl to her regimen. Will call her for a BP check in 2 weeks and will get BMET in 2 weeks. She should continue losartan.     I talked with pt today about her BP. Pt has not checked her BP regularly but in general feels it is trending down. Pt states BP today was 110/79 and BP a few days ago at drugstore was 138/89. Pt will make an effort to check her BP regularly and record the readings. Pt will let me know if she does not continue to see a downward trend. Pt scheduled for BMP 04/19/11. Pt denies lightheadedness. I will forward to Dr Shirlee Latch for review.

## 2011-04-19 ENCOUNTER — Other Ambulatory Visit (INDEPENDENT_AMBULATORY_CARE_PROVIDER_SITE_OTHER): Payer: Medicare Other | Admitting: *Deleted

## 2011-04-19 DIAGNOSIS — R0609 Other forms of dyspnea: Secondary | ICD-10-CM

## 2011-04-19 DIAGNOSIS — R0989 Other specified symptoms and signs involving the circulatory and respiratory systems: Secondary | ICD-10-CM

## 2011-04-19 DIAGNOSIS — I10 Essential (primary) hypertension: Secondary | ICD-10-CM

## 2011-04-19 LAB — BASIC METABOLIC PANEL
BUN: 17 mg/dL (ref 6–23)
CO2: 31 mEq/L (ref 19–32)
Calcium: 9 mg/dL (ref 8.4–10.5)
Chloride: 104 mEq/L (ref 96–112)
Creatinine, Ser: 0.6 mg/dL (ref 0.4–1.2)
GFR: 97.19 mL/min (ref >60.00)
Glucose, Bld: 101 mg/dL — ABNORMAL HIGH (ref 70–99)
Potassium: 3.4 mEq/L — ABNORMAL LOW (ref 3.5–5.1)
Sodium: 141 mEq/L (ref 135–145)

## 2011-04-19 LAB — BRAIN NATRIURETIC PEPTIDE: Pro B Natriuretic peptide (BNP): 67 pg/mL (ref 0.0–100.0)

## 2011-04-22 ENCOUNTER — Telehealth: Payer: Self-pay | Admitting: *Deleted

## 2011-04-22 DIAGNOSIS — E876 Hypokalemia: Secondary | ICD-10-CM

## 2011-04-22 NOTE — Telephone Encounter (Signed)
Notes Recorded by Jacqlyn Krauss, RN on 04/22/2011 at 5:27 PM Pt notified of results. Pt is on 20 mEq of KCL daily. She agreed to increase KCL to 20 mEq bid. She will return for [facility] 05/03/11. ------  Notes Recorded by Marca Ancona, MD on 04/20/2011 at 10:49 PM Increase her daily KCl dose by 20 mEq and repeat BMET in 10 days    Pt also gave me a range of BP readings. Pt states in the morning before meds her BP is in the 150/86 range. After meds it has been 139-156/75-80 range. In the evening pt states her BP is in the 90/57 range. I will forward to Dr Shirlee Latch for review.

## 2011-05-03 ENCOUNTER — Telehealth: Payer: Self-pay | Admitting: Cardiology

## 2011-05-03 ENCOUNTER — Other Ambulatory Visit (INDEPENDENT_AMBULATORY_CARE_PROVIDER_SITE_OTHER): Payer: Medicare Other | Admitting: *Deleted

## 2011-05-03 DIAGNOSIS — E876 Hypokalemia: Secondary | ICD-10-CM

## 2011-05-03 LAB — BASIC METABOLIC PANEL
BUN: 17 mg/dL (ref 6–23)
CO2: 31 mEq/L (ref 19–32)
Calcium: 9.5 mg/dL (ref 8.4–10.5)
Chloride: 105 mEq/L (ref 96–112)
Creatinine, Ser: 0.8 mg/dL (ref 0.4–1.2)
GFR: 79.7 mL/min (ref >60.00)
Glucose, Bld: 101 mg/dL — ABNORMAL HIGH (ref 70–99)
Potassium: 4.9 mEq/L (ref 3.5–5.1)
Sodium: 141 mEq/L (ref 135–145)

## 2011-05-03 NOTE — Telephone Encounter (Signed)
Per pt call the top number of her b/p drops below 100 and she needs to know what to do

## 2011-05-03 NOTE — Telephone Encounter (Signed)
Pt calling stating she is worried about her BP--pt states BP this pm 100/60 and she feels fatigued--pt has o.v. In august--advised to increase fluids and use alittle salt, also try to cut cozaar into quarters(she's taking 1/2 tab now) and see if that helps--also advised to quit taking BP more than 1 time per/day--pt stated she would do the above and if further problems she'll call back--nt

## 2011-06-03 ENCOUNTER — Other Ambulatory Visit: Payer: Medicare Other | Admitting: *Deleted

## 2011-06-04 ENCOUNTER — Other Ambulatory Visit (INDEPENDENT_AMBULATORY_CARE_PROVIDER_SITE_OTHER): Payer: Medicare Other | Admitting: *Deleted

## 2011-06-04 DIAGNOSIS — E7800 Pure hypercholesterolemia, unspecified: Secondary | ICD-10-CM

## 2011-06-04 LAB — LIPID PANEL
Cholesterol: 135 mg/dL (ref 0–200)
HDL: 54.4 mg/dL (ref >39.00)
LDL Cholesterol: 69 mg/dL (ref 0–99)
Total CHOL/HDL Ratio: 2
Triglycerides: 58 mg/dL (ref 0.0–149.0)
VLDL: 11.6 mg/dL (ref 0.0–40.0)

## 2011-06-04 LAB — HEPATIC FUNCTION PANEL
ALT: 18 U/L (ref 0–35)
AST: 22 U/L (ref 0–37)
Albumin: 4 g/dL (ref 3.5–5.2)
Alkaline Phosphatase: 56 U/L (ref 39–117)
Bilirubin, Direct: 0.2 mg/dL (ref 0.0–0.3)
Total Bilirubin: 1 mg/dL (ref 0.3–1.2)
Total Protein: 6.3 g/dL (ref 6.0–8.3)

## 2011-06-11 ENCOUNTER — Encounter: Payer: Self-pay | Admitting: Internal Medicine

## 2011-06-19 ENCOUNTER — Encounter: Payer: Self-pay | Admitting: Cardiology

## 2011-06-25 ENCOUNTER — Encounter: Payer: Self-pay | Admitting: Family Medicine

## 2011-06-25 ENCOUNTER — Ambulatory Visit (INDEPENDENT_AMBULATORY_CARE_PROVIDER_SITE_OTHER): Payer: Medicare Other | Admitting: Family Medicine

## 2011-06-25 ENCOUNTER — Telehealth: Payer: Self-pay | Admitting: Internal Medicine

## 2011-06-25 VITALS — BP 120/78 | Temp 99.0°F | Wt 127.0 lb

## 2011-06-25 DIAGNOSIS — R509 Fever, unspecified: Secondary | ICD-10-CM

## 2011-06-25 LAB — POCT URINALYSIS DIPSTICK
Bilirubin, UA: NEGATIVE
Glucose, UA: NEGATIVE
Ketones, UA: NEGATIVE
Nitrite, UA: NEGATIVE
Protein, UA: NEGATIVE
Spec Grav, UA: 1.005
Urobilinogen, UA: 1
pH, UA: 7

## 2011-06-25 MED ORDER — CIPROFLOXACIN HCL 250 MG PO TABS: 250.0000 mg | ORAL_TABLET | Freq: Two times a day (BID) | ORAL | Status: DC

## 2011-06-25 NOTE — Patient Instructions (Signed)
Urinary Tract Infection (UTI)   Infections of the urinary tract can start in several places. A bladder infection (cystitis), a kidney infection (pyelonephritis), and a prostate infection (prostatitis) are different types of urinary tract infections. They usually get better if treated with medicines (antibiotics) that kill germs. Take all the medicine until it is gone. You or your child may feel better in a few days, but TAKE ALL MEDICINE or the infection may not respond and may become more difficult to treat.   HOME CARE INSTRUCTIONS   Drink enough water and fluids to keep the urine clear or pale yellow. Cranberry juice is especially recommended, in addition to large amounts of water.   Avoid caffeine, tea, and carbonated beverages. They tend to irritate the bladder.   Alcohol may irritate the prostate.   Only take over-the-counter or prescription medicines for pain, discomfort, or fever as directed by your caregiver.   FINDING OUT THE RESULTS OF YOUR TEST   Not all test results are available during your visit. If your or your child's test results are not back during the visit, make an appointment with your caregiver to find out the results. Do not assume everything is normal if you have not heard from your caregiver or the medical facility. It is important for you to follow up on all test results.   TO PREVENT FURTHER INFECTIONS:   Empty the bladder often. Avoid holding urine for long periods of time.   After a bowel movement, women should cleanse from front to back. Use each tissue only once.   Empty the bladder before and after sexual intercourse.   SEEK MEDICAL CARE IF:   There is back pain.   You or your child has an oral temperature above 101.   Your baby is older than 3 months with a rectal temperature of 100.5º F (38.1° C) or higher for more than 1 day.   Your or your child's problems (symptoms) are no better in 3 days. Return sooner if you or your child is getting worse.   SEEK IMMEDIATE MEDICAL CARE IF:    There is severe back pain or lower abdominal pain.   You or your child develops chills.   You or your child has an oral temperature above 101, not controlled by medicine.   Your baby is older than 3 months with a rectal temperature of 102º F (38.9º C) or higher.   Your baby is 3 months old or younger with a rectal temperature of 100.4º F (38º C) or higher.   There is nausea or vomiting.   There is continued burning or discomfort with urination.   MAKE SURE YOU:   Understand these instructions.   Will watch this condition.   Will get help right away if you or your child is not doing well or gets worse.   Document Released: 08/14/2005 Document Re-Released: 01/29/2010   Exit[employer]® Patient Information ©2011 [employer].

## 2011-06-25 NOTE — Telephone Encounter (Signed)
Scheduled appt with Dr Caryl Never.

## 2011-06-25 NOTE — Progress Notes (Signed)
  Subjective:    Patient ID: [name], female    DOB: 1941/05/09, 71 y.o.   MRN: [id number]  HPI Onset yesterday of malaise, mild body aches, decreased appetite, and mild dizziness. Dizziness described as does equilibrium. Yesterday afternoon developed fever 100.4 and some chills. Denies urine frequency or burning but has had similar symptoms in past with UTI. She denies any cough, nasal congestion, abdominal pain, vomiting, diarrhea, headache, sore throat, or any rash.   Review of Systems  Constitutional: Positive for fever, chills, appetite change and fatigue. Negative for unexpected weight change.  HENT: Negative for sore throat, facial swelling and neck pain.   Respiratory: Negative for cough and shortness of breath.   Cardiovascular: Negative for chest pain.  Gastrointestinal: Negative for abdominal pain.  Genitourinary: Negative for hematuria and flank pain.  Musculoskeletal: Negative for back pain.  Neurological: Negative for headaches.  Hematological: Negative for adenopathy.       Objective:   Physical Exam  Constitutional: She is oriented to person, place, and time. She appears well-developed and well-nourished.  HENT:  Right Ear: External ear normal.  Left Ear: External ear normal.  Mouth/Throat: Oropharynx is clear and moist. No oropharyngeal exudate.  Neck: Neck supple.  Cardiovascular: Normal rate and regular rhythm.   Pulmonary/Chest: Effort normal and breath sounds normal. No respiratory distress. She has no wheezes. She has no rales.  Musculoskeletal: She exhibits no edema.  Lymphadenopathy:    She has no cervical adenopathy.  Neurological: She is alert and oriented to person, place, and time.  Skin: No rash noted.          Assessment & Plan:  Constitutional symptoms of body aches, poor appetite and fatigue. Urine dipstick reveals positive leukocytes and trace blood. Rule out UTI. Urine culture sent. Cipro 250 mg twice a day for 7 days

## 2011-06-25 NOTE — Telephone Encounter (Signed)
Pt is running a low grade fever. Last night temp was 100.4. This a.m, 99.00. Pt req ov or med to be called in to [facility].

## 2011-06-27 LAB — URINE CULTURE
Colony Count: NO GROWTH
Organism ID, Bacteria: NO GROWTH

## 2011-06-27 NOTE — Progress Notes (Signed)
Quick Note:  Pt informed ______ 

## 2011-06-28 ENCOUNTER — Ambulatory Visit (INDEPENDENT_AMBULATORY_CARE_PROVIDER_SITE_OTHER): Payer: Medicare Other | Admitting: Cardiology

## 2011-06-28 ENCOUNTER — Encounter: Payer: Self-pay | Admitting: Cardiology

## 2011-06-28 VITALS — BP 102/68 | HR 67 | Resp 18 | Ht 62.0 in | Wt 121.4 lb

## 2011-06-28 DIAGNOSIS — E785 Hyperlipidemia, unspecified: Secondary | ICD-10-CM

## 2011-06-28 DIAGNOSIS — I10 Essential (primary) hypertension: Secondary | ICD-10-CM

## 2011-06-28 MED ORDER — LOSARTAN POTASSIUM 100 MG PO TABS: 50.0000 mg | ORAL_TABLET | Freq: Every day | ORAL | Status: DC

## 2011-06-28 NOTE — Patient Instructions (Signed)
Your physician recommends that you schedule a follow-up appointment in: 1 year with Dr. McLean  

## 2011-06-30 NOTE — Assessment & Plan Note (Signed)
BP has been under better control.  It is actually low today in the midst of what sounds like viral gastroenteritis.  I would agree with holding her BP meds until the nausea/vomiting/diarrhea resolve.

## 2011-06-30 NOTE — Assessment & Plan Note (Signed)
Lipids excellent with addition of atorvastatin.

## 2011-06-30 NOTE — Progress Notes (Signed)
PCP: Dr. Fabian Sharp   70 yo with history of HTN and hyperlipidemia returns for followup of labile blood pressure. Patient was diagnosed with HTN over 2 years ago. At one time, she took amlodipine 5 mg daily and losartan 100 mg daily but stopped both due to orthostatic symptoms.   Her blood pressure had been running high, and at a prior appointment, I had her start on losartan 50 mg daily.  She is not currently getting lightheadedness with standing. No chest pain. No exertional dyspnea unless she walks up a hill. She has never smoked. Her father had what sounds like sudden cardiac death at age 53.  BP has been better, for the most part SBP is in the 130s and occasionally 140s.  Today it is 102/68.  She reports gastroenteritis-like symptoms with 2 days of vomiting, nausea, and diarrhea.  She has not been taking her BP meds during this illness as BP has been running on the low side.   Labs (10/11): K 4.5, creatinine 0.8, HDL 62, LDL 157, TSH normal  Labs (5/12): K 4.4, creatinine 0.7, LDL 156, HDL 66 Labs (6/12): K 4.9, creatinine 0.8, BNP 67 Labs (7/12): LDL 69, HDL 54, LFTs normal  PMH:  1. HTN: x 2 years at least. Cough with ACEI. She has had a history of orthostatic symptoms. Echo (5/12): EF 60-65%, mild focal basal septal hypertrophy, grade II diastolic dysfunction, mild MR.  2. Colonic polyps  3. IBS.  4. H/o hyperparathyroidism s/p parathyroidectomy  5. Osteoporosis  6. Hyperlipidemia  7. IBS  8. History of recurrent UTIS   SH: Nonsmoker. Married, lives in [place]. Son lives with her. Works as travel Water quality scientist.   FH: Father with sudden cardiac death at age 88. Mother with rheumatic heart disease, had valve replacements. Sister is healthy.   ROS: All systems reviewed and negative except as per HPI.   Current Outpatient Prescriptions  Medication Sig Dispense Refill  . aspirin 81 MG tablet Take 81 mg by mouth daily.        [name] atorvastatin (LIPITOR) 20 MG tablet Take 1 tablet (20 mg total) by  mouth daily.  30 tablet  3  . chlorthalidone (HYGROTON) 25 MG tablet Take 1/2 tablet daily  15 tablet  6  . losartan (COZAAR) 100 MG tablet Take 0.5 tablets (50 mg total) by mouth daily.  90 tablet  3  . potassium chloride SA (KLOR-CON M20) 20 MEQ tablet Take 1 tablet (20 mEq total) by mouth 2 (two) times daily.  30 tablet  6    BP 102/68  Pulse 67  Resp 18  Ht 5\' 2"  (1.575 m)  Wt 121 lb 6.4 oz (55.067 kg)  BMI 22.20 kg/m2 General: NAD Neck: No JVD, no thyromegaly or thyroid nodule.  Lungs: Clear to auscultation bilaterally with normal respiratory effort. CV: Nondisplaced PMI.  Heart regular S1/S2, no S3, +S4, no murmur.  No peripheral edema.  No carotid bruit.  Normal pedal pulses.  Abdomen: Soft, nontender, no hepatosplenomegaly, no distention.  Neurologic: Alert and oriented x 3.  Psych: Normal affect. Extremities: No clubbing or cyanosis.

## 2011-08-27 LAB — DIFFERENTIAL
Basophils Absolute: 0
Basophils Relative: 1
Eosinophils Absolute: 0.1
Eosinophils Relative: 2
Lymphocytes Relative: 37
Lymphs Abs: 1.7
Monocytes Absolute: 0.5
Monocytes Relative: 11
Neutro Abs: 2.3
Neutrophils Relative %: 50

## 2011-08-27 LAB — URINE MICROSCOPIC-ADD ON

## 2011-08-27 LAB — URINALYSIS, ROUTINE W REFLEX MICROSCOPIC
Bilirubin Urine: NEGATIVE
Glucose, UA: NEGATIVE
Hgb urine dipstick: NEGATIVE
Ketones, ur: NEGATIVE
Nitrite: POSITIVE — AB
Protein, ur: NEGATIVE
Specific Gravity, Urine: 1.007
Urobilinogen, UA: 0.2
pH: 7

## 2011-08-27 LAB — BASIC METABOLIC PANEL
BUN: 10
CO2: 24
Calcium: 10.4
Chloride: 109
Creatinine, Ser: 0.66
GFR calc Af Amer: >60
GFR calc non Af Amer: >60
Glucose, Bld: 100 — ABNORMAL HIGH
Potassium: 4
Sodium: 142

## 2011-08-27 LAB — CALCIUM
Calcium: 7.6 — ABNORMAL LOW
Calcium: 8.9

## 2011-08-27 LAB — CBC
HCT: 42.7
Hemoglobin: 14.8
MCHC: 34.6
MCV: 92.2
Platelets: 235
RBC: 4.63
RDW: 13.7
WBC: 4.6

## 2011-08-27 LAB — PROTIME-INR
INR: 0.9
Prothrombin Time: 12.4

## 2011-08-29 ENCOUNTER — Other Ambulatory Visit: Payer: Self-pay | Admitting: Cardiology

## 2011-08-30 ENCOUNTER — Telehealth: Payer: Self-pay | Admitting: *Deleted

## 2011-08-30 NOTE — Telephone Encounter (Signed)
Pt left a voicemail saying her legs were hurting around 4pm thurs afternoon. I called her this am and she said that she went to [facility] last night and dx her with sciatica. Pt was given voltaren and is doing fine now.

## 2011-09-05 ENCOUNTER — Other Ambulatory Visit: Payer: Self-pay | Admitting: Cardiology

## 2011-09-17 ENCOUNTER — Ambulatory Visit (INDEPENDENT_AMBULATORY_CARE_PROVIDER_SITE_OTHER): Payer: Medicare Other | Admitting: Internal Medicine

## 2011-09-17 ENCOUNTER — Encounter: Payer: Self-pay | Admitting: Internal Medicine

## 2011-09-17 VITALS — BP 120/80 | HR 60 | Wt 129.0 lb

## 2011-09-17 DIAGNOSIS — E213 Hyperparathyroidism, unspecified: Secondary | ICD-10-CM

## 2011-09-17 DIAGNOSIS — M810 Age-related osteoporosis without current pathological fracture: Secondary | ICD-10-CM

## 2011-09-17 DIAGNOSIS — N390 Urinary tract infection, site not specified: Secondary | ICD-10-CM

## 2011-09-17 DIAGNOSIS — E785 Hyperlipidemia, unspecified: Secondary | ICD-10-CM

## 2011-09-17 DIAGNOSIS — Z23 Encounter for immunization: Secondary | ICD-10-CM

## 2011-09-17 DIAGNOSIS — G479 Sleep disorder, unspecified: Secondary | ICD-10-CM

## 2011-09-17 DIAGNOSIS — I10 Essential (primary) hypertension: Secondary | ICD-10-CM

## 2011-09-17 MED ORDER — ZOLPIDEM TARTRATE 5 MG PO TABS: 5.0000 mg | ORAL_TABLET | Freq: Every evening | ORAL | Status: DC | PRN

## 2011-09-17 NOTE — Progress Notes (Signed)
  Subjective:    Patient ID: [name], female    DOB: April 22, 1941, 70 y.o.   MRN: [id number]  HPI Patient comes in today for follow up of  multiple medical problems.    No major change in health status since last visit . Pain behind knee and sent to Dr Oretha Ellis office     PREm pro.  Then had parathyroid  Disease.  ? About ovarian cancer.  Hx of ovarian tumor.  Sleep  Situational issues    Has used ambien rarely  Asks about using again small amounts 2.5- 5 mg or so .     Review of Systems No cp sob nvd weight change  Fracture  Feels generally well no GI GU issues  Past history family history social history reviewed in the electronic medical record.     Objective:   Physical Exam  WDWN in nad  Neck no masses  BP see readings rr no g or m Neuro grossly normal DEXA scan reviewed with pt.     Assessment & Plan:  HT  Now controlled  LIPIDS no se of meds  SLEEP disturbance intermittently    Risk benefit of medication discussed.  Caution use of meds . Osteoporosis  Hx of bisphosphonate therapy and hrt in the past .  Is sp parathyroidectomy . Calcium levels stable.  Total visit 30 mins > 50% spent counseling and coordinating care .

## 2011-09-17 NOTE — Assessment & Plan Note (Signed)
Better over the last  Few months   Mostly in normal range and denies sig se of meds

## 2011-09-17 NOTE — Assessment & Plan Note (Signed)
Disc possibilities .    May be candidate   For prolia renal function and calcium is normal   Taking  Vit d now an has calcium in diet,

## 2011-09-17 NOTE — Patient Instructions (Addendum)
You have   Osteoporosis and  Consider prolia  Or we can go  Back on a bisphosphonates . Call if you are interested in beginning this medication. Plan repeat DEXA in  2 years either way.  Before we start this would need to repeat your chemistry test  Your blood pressure is good and remain on this.  Sleep aids as needed.

## 2011-12-23 ENCOUNTER — Other Ambulatory Visit: Payer: Self-pay | Admitting: *Deleted

## 2011-12-23 DIAGNOSIS — I10 Essential (primary) hypertension: Secondary | ICD-10-CM

## 2011-12-23 MED ORDER — CHLORTHALIDONE 25 MG PO TABS: ORAL_TABLET | ORAL | Status: DC

## 2011-12-24 ENCOUNTER — Other Ambulatory Visit: Payer: Self-pay | Admitting: Internal Medicine

## 2011-12-24 NOTE — Telephone Encounter (Signed)
Per Dr. Panosh- ok x 1 

## 2011-12-25 NOTE — Telephone Encounter (Signed)
Rx called in 

## 2012-02-13 DIAGNOSIS — M47812 Spondylosis without myelopathy or radiculopathy, cervical region: Secondary | ICD-10-CM | POA: Diagnosis not present

## 2012-05-08 DIAGNOSIS — L259 Unspecified contact dermatitis, unspecified cause: Secondary | ICD-10-CM | POA: Diagnosis not present

## 2012-05-08 DIAGNOSIS — L299 Pruritus, unspecified: Secondary | ICD-10-CM | POA: Diagnosis not present

## 2012-06-30 DIAGNOSIS — H52209 Unspecified astigmatism, unspecified eye: Secondary | ICD-10-CM | POA: Diagnosis not present

## 2012-06-30 DIAGNOSIS — H2640 Unspecified secondary cataract: Secondary | ICD-10-CM | POA: Diagnosis not present

## 2012-06-30 DIAGNOSIS — Z961 Presence of intraocular lens: Secondary | ICD-10-CM | POA: Diagnosis not present

## 2012-07-16 DIAGNOSIS — H2640 Unspecified secondary cataract: Secondary | ICD-10-CM | POA: Diagnosis not present

## 2012-07-16 DIAGNOSIS — H26499 Other secondary cataract, unspecified eye: Secondary | ICD-10-CM | POA: Diagnosis not present

## 2012-08-14 ENCOUNTER — Ambulatory Visit: Payer: Medicare Other | Admitting: Internal Medicine

## 2012-08-14 ENCOUNTER — Telehealth: Payer: Self-pay | Admitting: Internal Medicine

## 2012-08-14 ENCOUNTER — Encounter: Payer: Self-pay | Admitting: Internal Medicine

## 2012-08-14 ENCOUNTER — Ambulatory Visit (INDEPENDENT_AMBULATORY_CARE_PROVIDER_SITE_OTHER): Payer: Medicare Other | Admitting: Internal Medicine

## 2012-08-14 VITALS — BP 174/82 | HR 72 | Temp 98.1°F | Wt 126.0 lb

## 2012-08-14 DIAGNOSIS — I10 Essential (primary) hypertension: Secondary | ICD-10-CM | POA: Diagnosis not present

## 2012-08-14 MED ORDER — AMLODIPINE BESYLATE 2.5 MG PO TABS: 2.5000 mg | ORAL_TABLET | Freq: Every day | ORAL | Status: DC

## 2012-08-14 MED ORDER — ATORVASTATIN CALCIUM 20 MG PO TABS: 20.0000 mg | ORAL_TABLET | Freq: Every day | ORAL | Status: DC

## 2012-08-14 NOTE — Assessment & Plan Note (Signed)
Patient with poor blood pressure control due to medication noncompliance. She stopped her medication 6 months ago. She reports she got low blood pressure readings and had difficulty tolerating her diuretic and potassium supplement.  We will try to simplify her antihypertensive regimen. Restart losartan 50 mg once daily along with amlodipine 2.5 mg once daily. Discontinue her diuretic and potassium supplement. Patient advised to followup with her primary care physician within 4 weeks. She was instructed to call our office with her blood pressure readings over next 1 to 2 weeks.

## 2012-08-14 NOTE — Patient Instructions (Addendum)
Please complete the following lab tests before your next follow up appointment: BMET - 401.9 FLP, LFTs, TSH - 272.4 Please follow up with Dr. Fabian Sharp within 4 weeks Continue to monitor your blood pressure at home.  Call our office with your readings in 1 week.

## 2012-08-14 NOTE — Progress Notes (Signed)
Subjective:    Patient ID: [name], female    DOB: 1941-04-03, 71 y.o.   MRN: [id number]  HPI  71 year old white female with history of hypertension presents with acute blood pressure elevation. Patient states approximately 6 months ago she completely stopped all of her BP medications due to side effects and low blood pressure readings . She recently returned from a trip to [place] and noticed she felt jittery. Her husband has a automated blood pressure cuff and patient checked her BP at home with systolic blood pressure readings in the 170s and diastolic readings in the 90s.  She denies associated headache or chest pain. She had difficulty tolerating potassium supplementation and diuretic.  Review of Systems See HPI  Past Medical History  Diagnosis Date  . HYPERPARATHYROIDISM UNSPECIFIED 10/20/2007  . HYPERLIPIDEMIA 07/07/2007  . ANEMIA-NOS 07/07/2007  . HYPERTENSION 02/04/2008  . DIVERTICULOSIS, COLON 02/23/2009  . UTI'S, RECURRENT 11/20/2009    kimbrough   . OSTEOPOROSIS 01/01/2008  . SCOLIOSIS 05/25/2009  . FATIGUE 05/25/2009  . PALPITATIONS, OCCASIONAL 07/12/2008  . URINARY INCONTINENCE 07/07/2007  . ADVERSE REACTION TO MEDICATION 07/31/2009  . COLONIC POLYPS, ADENOMATOUS, HX OF 02/23/2009  . NEPHROLITHIASIS, HX OF 11/15/2008  . PARATHYROIDECTOMY 01/02/2009  . Seasonal allergies   . Unspecified constipation   . Other acquired absence of organ     parathyroidectomy  . Fever, unspecified   . Abdominal pain, unspecified site   . Disturbance of skin sensation   . Pain in limb   . Chest pain, unspecified   . Family history of diabetes mellitus   . Family history of malignant neoplasm of breast     History   Social History  . Marital Status: Married    Spouse Name: N/A    Number of Children: 5  . Years of Education: N/A   Occupational History  . travel agent    Social History Main Topics  . Smoking status: Never Smoker   . Smokeless tobacco: Never Used  . Alcohol Use:  Yes     occ. wine  . Drug Use: No  . Sexually Active: Not on file   Other Topics Concern  . Not on file   Social History Narrative   HH of 5 Kids at home nowMarried   travel agent. 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 Right oophorectomy   . Parathyroidectomy     Rt Superior open neck exploration    Family History  Problem Relation Age of Onset  . Breast cancer Other     1st degree relative <50  . Diabetes      1st degree relative  . Sudden death Father 62  . Heart disease Mother     valve replacement    Allergies  Allergen Reactions  . Lisinopril     REACTION: cough    Current Outpatient Prescriptions on File Prior to Visit  Medication Sig Dispense Refill  . VALIUM 2 MG tablet TAKE 1/2 TO 1 TABLET 2 TIMES A DAY.  20 each  0  . zolpidem (AMBIEN) 5 MG tablet Take 1 tablet (5 mg total) by mouth at bedtime as needed for sleep.  14 tablet  1  . amLODipine (NORVASC) 2.5 MG tablet Take 1 tablet (2.5 mg total) by mouth daily.  90 tablet  1  . aspirin 81 MG tablet Take 81 mg by mouth daily.        [name]  losartan (COZAAR) 100 MG tablet Take 0.5 tablets (50 mg total) by mouth daily.  90 tablet  3  . DISCONTD: LIPITOR 20 MG tablet TAKE 1 TABLET EACH DAY.  30 each  6    BP 174/94  Pulse 72  Temp 98.1 F (36.7 C) (Oral)  Wt 126 lb (57.153 kg)  SpO2 97%       Objective:   Physical Exam  Constitutional: She appears well-developed and well-nourished.  Neck:       Negative for carotid bruit  Cardiovascular: Normal rate, regular rhythm and normal heart sounds.   No murmur heard. Pulmonary/Chest: Effort normal and breath sounds normal. She has no wheezes.  Musculoskeletal: She exhibits no edema.  Psychiatric: She has a normal mood and affect. Her behavior is normal.          Assessment & Plan:

## 2012-08-14 NOTE — Telephone Encounter (Signed)
Caller: Reise/Patient; Patient Name: [name]; PCP: Berniece Andreas [facility]); Best Callback Phone Number: [phone].  Patient calling about elevated blood pressure.  States returned from trip away, and felt "jumpy."  She states she checked her BP, and reading 08/14/12 0930 was 215/105.  After resting for an hour, on recheck BP was 143/95.  Currently not taking any BP medications, as 6 months ago her BP was low and she felt "lousy all the time."  Per hypertension protocol, advised being seen within 4 hours; appoitnment scheduled 1415 08/14/12 with Dr. Amador Cunas.

## 2012-08-17 ENCOUNTER — Other Ambulatory Visit: Payer: Self-pay | Admitting: *Deleted

## 2012-08-17 MED ORDER — ATORVASTATIN CALCIUM 20 MG PO TABS: 20.0000 mg | ORAL_TABLET | Freq: Every day | ORAL | Status: DC

## 2012-08-19 DIAGNOSIS — L719 Rosacea, unspecified: Secondary | ICD-10-CM | POA: Diagnosis not present

## 2012-08-19 DIAGNOSIS — L738 Other specified follicular disorders: Secondary | ICD-10-CM | POA: Diagnosis not present

## 2012-08-19 DIAGNOSIS — L821 Other seborrheic keratosis: Secondary | ICD-10-CM | POA: Diagnosis not present

## 2012-08-19 DIAGNOSIS — Z85828 Personal history of other malignant neoplasm of skin: Secondary | ICD-10-CM | POA: Diagnosis not present

## 2012-08-19 DIAGNOSIS — L819 Disorder of pigmentation, unspecified: Secondary | ICD-10-CM | POA: Diagnosis not present

## 2012-08-24 ENCOUNTER — Telehealth: Payer: Self-pay | Admitting: Internal Medicine

## 2012-08-24 ENCOUNTER — Telehealth: Payer: Self-pay | Admitting: *Deleted

## 2012-08-24 ENCOUNTER — Other Ambulatory Visit: Payer: Self-pay | Admitting: Internal Medicine

## 2012-08-24 ENCOUNTER — Other Ambulatory Visit: Payer: Self-pay | Admitting: Cardiology

## 2012-08-24 MED ORDER — LOSARTAN POTASSIUM 100 MG PO TABS: 50.0000 mg | ORAL_TABLET | Freq: Every day | ORAL | Status: DC

## 2012-08-24 NOTE — Telephone Encounter (Signed)
Spoke with pt due to her needing refill on Losartan she is overdue for 1 yr f/u and needs to schedule appointment. She mentioned that she would contact her primary first and then if not she will return call to schedule appointment.

## 2012-08-24 NOTE — Telephone Encounter (Signed)
Caller: [name]/Patient; Patient Name: [name]; PCP: Berniece Andreas [facility]); Best Callback Phone Number: [phone].  Call regarding refill for Losartan.  Refill script has run out.  Losartan 50mg  nightly.  Pt has one dose left.  She has an appt 09/11/12  with Dr. Fabian Sharp. [employer] verified and on file.  No symptom, no triage.    PLEASE FOLLOW UP WITH PT/PHARMACY IN REGARD TO SCRIPT.  Thank you.

## 2012-08-24 NOTE — Telephone Encounter (Signed)
#  30 Sent to the pharmacy by e-scribe.

## 2012-08-28 ENCOUNTER — Other Ambulatory Visit (INDEPENDENT_AMBULATORY_CARE_PROVIDER_SITE_OTHER): Payer: Medicare Other

## 2012-08-28 DIAGNOSIS — E785 Hyperlipidemia, unspecified: Secondary | ICD-10-CM | POA: Diagnosis not present

## 2012-08-28 DIAGNOSIS — I10 Essential (primary) hypertension: Secondary | ICD-10-CM

## 2012-08-28 LAB — TSH: TSH: 1.28 u[IU]/mL (ref 0.35–5.50)

## 2012-08-28 LAB — BASIC METABOLIC PANEL
BUN: 13 mg/dL (ref 6–23)
CO2: 26 mEq/L (ref 19–32)
Calcium: 9.1 mg/dL (ref 8.4–10.5)
Chloride: 108 mEq/L (ref 96–112)
Creatinine, Ser: 0.7 mg/dL (ref 0.4–1.2)
GFR: 88.77 mL/min (ref >60.00)
Glucose, Bld: 93 mg/dL (ref 70–99)
Potassium: 5.4 mEq/L — ABNORMAL HIGH (ref 3.5–5.1)
Sodium: 144 mEq/L (ref 135–145)

## 2012-08-28 LAB — LIPID PANEL
Cholesterol: 158 mg/dL (ref 0–200)
HDL: 59.3 mg/dL (ref >39.00)
LDL Cholesterol: 86 mg/dL (ref 0–99)
Total CHOL/HDL Ratio: 3
Triglycerides: 63 mg/dL (ref 0.0–149.0)
VLDL: 12.6 mg/dL (ref 0.0–40.0)

## 2012-08-28 LAB — HEPATIC FUNCTION PANEL
ALT: 13 U/L (ref 0–35)
AST: 20 U/L (ref 0–37)
Albumin: 3.9 g/dL (ref 3.5–5.2)
Alkaline Phosphatase: 56 U/L (ref 39–117)
Bilirubin, Direct: 0.1 mg/dL (ref 0.0–0.3)
Total Bilirubin: 1.2 mg/dL (ref 0.3–1.2)
Total Protein: 6.8 g/dL (ref 6.0–8.3)

## 2012-09-11 ENCOUNTER — Ambulatory Visit (INDEPENDENT_AMBULATORY_CARE_PROVIDER_SITE_OTHER): Payer: Medicare Other | Admitting: Internal Medicine

## 2012-09-11 ENCOUNTER — Encounter: Payer: Self-pay | Admitting: Internal Medicine

## 2012-09-11 VITALS — BP 156/84 | HR 33 | Temp 98.3°F | Wt 125.0 lb

## 2012-09-11 DIAGNOSIS — E875 Hyperkalemia: Secondary | ICD-10-CM

## 2012-09-11 DIAGNOSIS — I519 Heart disease, unspecified: Secondary | ICD-10-CM

## 2012-09-11 DIAGNOSIS — R32 Unspecified urinary incontinence: Secondary | ICD-10-CM

## 2012-09-11 DIAGNOSIS — I10 Essential (primary) hypertension: Secondary | ICD-10-CM | POA: Diagnosis not present

## 2012-09-11 DIAGNOSIS — I5189 Other ill-defined heart diseases: Secondary | ICD-10-CM | POA: Insufficient documentation

## 2012-09-11 NOTE — Patient Instructions (Addendum)
Increase the losartan 100 mg a day if her blood pressure is not below 150 in 2 weeks.  Check BMP in  2-3 weeks   Plan OV after calling about readings labs and plan  or if having side effects or concerns

## 2012-09-11 NOTE — Progress Notes (Signed)
  Subjective:    Patient ID: [name], female    DOB: [birth year], 71 y.o.   MRN: [id number]  HPI Pt comes in today for followup of elevated blood pressure readings . Was seen by Dr. Cathie Hoops about 3 weeks ago and put back on blood pressure medication that she had stopped about 6 months before because of history of low blood pressure. Since that time she's been taking 2.5 of amlodipine and 50 mg of losartan. No significant side effects blood pressures still running high in the 158-160 range but much better than the 200 range from when she came in. she has no major symptoms with this. She also had labs done at that visitReview of Systems;Negative for chest pain shortness of breath syncope nausea vomiting or new rash. Rest as per hpi  Past history family history social history reviewed in the electronic medical record.   Objective:   Physical Exam WDWN innad BP 156/84  Pulse 33  Temp 98.3 F (36.8 C) (Oral)  Wt 125 lb (56.7 kg)  SpO2 96% bp 132/80 right 158/80 left sitting   Well-developed well-nourished in no acute distress chest clear to auscultation unlabored breathing cardiac S1-S2 no gallops or murmurs are heard today. Negative CCE. Neuro grossly non focal Lab Results  Component Value Date   WBC 4.5 09/07/2010   HGB 14.4 09/07/2010   HCT 42.0 09/07/2010   PLT 227.0 09/07/2010   GLUCOSE 93 08/28/2012   CHOL 158 08/28/2012   TRIG 63.0 08/28/2012   HDL 59.30 08/28/2012   LDLDIRECT 156.3 03/28/2011   LDLCALC 86 08/28/2012   ALT 13 08/28/2012   AST 20 08/28/2012   NA 144 08/28/2012   K 5.4* 08/28/2012   CL 108 08/28/2012   CREATININE 0.7 08/28/2012   BUN 13 08/28/2012   CO2 26 08/28/2012   TSH 1.28 08/28/2012   INR 0.9 09/23/2007      Assessment & Plan:   Hypertension history of same a bit labile increased off of all medication. History of orthostasis on medication in past.  Hx of grade 2 diastolic dysfunction on echo last year . Titration of med  in order; improved today but  still not in the controlled range.  Took the time to review past records cardiology has seen her a year ago and suspected that the 5 mg amlodipine was the culprit in causing orthostasis. Therefore will increase her losartan to 100 mg a day if her readings are not continuing come down in the next 2 weeks. keepappt with cards and plan fu depending on  bp readings and labs  Suspect the hyperkalemia  is not from the ARB but from venipuncture  but will repeat it in 2-3 weeks. She's not on potassium supplement at this time.  Flu vaccine today  Hx of parathyroidectomy .  Off of patch and now urinary incontinence at times   Will address when Bp issue stable  Had seem urologist for this got se of patch.  May benefit of other rx in future.

## 2012-10-02 ENCOUNTER — Other Ambulatory Visit (INDEPENDENT_AMBULATORY_CARE_PROVIDER_SITE_OTHER): Payer: Medicare Other

## 2012-10-02 DIAGNOSIS — I10 Essential (primary) hypertension: Secondary | ICD-10-CM

## 2012-10-02 LAB — BASIC METABOLIC PANEL
BUN: 13 mg/dL (ref 6–23)
CO2: 29 mEq/L (ref 19–32)
Calcium: 9.3 mg/dL (ref 8.4–10.5)
Chloride: 109 mEq/L (ref 96–112)
Creatinine, Ser: 0.6 mg/dL (ref 0.4–1.2)
GFR: 96.8 mL/min (ref >60.00)
Glucose, Bld: 101 mg/dL — ABNORMAL HIGH (ref 70–99)
Potassium: 4.7 mEq/L (ref 3.5–5.1)
Sodium: 142 mEq/L (ref 135–145)

## 2012-10-08 ENCOUNTER — Encounter: Payer: Self-pay | Admitting: Family Medicine

## 2012-10-12 ENCOUNTER — Ambulatory Visit (INDEPENDENT_AMBULATORY_CARE_PROVIDER_SITE_OTHER): Payer: Medicare Other | Admitting: Cardiology

## 2012-10-12 ENCOUNTER — Encounter: Payer: Self-pay | Admitting: Cardiology

## 2012-10-12 VITALS — BP 120/78 | HR 64 | Resp 18 | Ht 63.0 in | Wt 125.0 lb

## 2012-10-12 DIAGNOSIS — E785 Hyperlipidemia, unspecified: Secondary | ICD-10-CM | POA: Diagnosis not present

## 2012-10-12 DIAGNOSIS — I10 Essential (primary) hypertension: Secondary | ICD-10-CM

## 2012-10-12 NOTE — Progress Notes (Signed)
Patient ID: [name], female   DOB: 21-Jul-1941, 71 y.o.   MRN: [id number] PCP: Dr. Fabian Sharp   71 yo with history of HTN and hyperlipidemia returns for followup of labile blood pressure.  She had stopped taking her BP meds earlier this year but BP rose off meds and they were recently restarted.  Her losartan was increased at last visit with PCP.  Today, her BP is normal but she says that it goes up to the 150s systolic when she checks at home.  Her BP cuff has been calibrated at Dr. Rosezella [place] office.  She has been walking for about 30 minutes 3 times a week with no exertional dyspnea or chest pain.  She is somewhat limited by back pain.    Labs (10/11): K 4.5, creatinine 0.8, HDL 62, LDL 157, TSH normal  Labs (5/12): K 4.4, creatinine 0.7, LDL 156, HDL 66 Labs (6/12): K 4.9, creatinine 0.8, BNP 67 Labs (7/12): LDL 69, HDL 54, LFTs normal Labs (14/78): LDL 86, HDL 59 Labs (11/13): K 4.7, creatinine 0.6  ECG: NSR, normal  PMH:  1. HTN: x 2 years at least. Cough with ACEI. She has had a history of orthostatic symptoms. Echo (5/12): EF 60-65%, mild focal basal septal hypertrophy, grade II diastolic dysfunction, mild MR.  2. Colonic polyps  3. IBS.  4. H/o hyperparathyroidism s/p parathyroidectomy  5. Osteoporosis  6. Hyperlipidemia  7. IBS  8. History of recurrent UTIs   SH: Nonsmoker. Married, lives in [place]. Son lives with her. Works as travel Water quality scientist.   FH: Father with sudden cardiac death at age 64. Mother with rheumatic heart disease, had valve replacements. Sister is healthy.   ROS: All systems reviewed and negative except as per HPI.   Current Outpatient Prescriptions  Medication Sig Dispense Refill  . amLODipine (NORVASC) 2.5 MG tablet Take 1 tablet (2.5 mg total) by mouth daily.  90 tablet  1  . aspirin 81 MG tablet Take 81 mg by mouth daily.        [name] atorvastatin (LIPITOR) 20 MG tablet Take 1 tablet (20 mg total) by mouth daily.  90 tablet  1  . losartan (COZAAR) 100 MG  tablet Take 1 tablet (100 mg total) by mouth daily.  30 tablet  0  . VALIUM 2 MG tablet TAKE 1/2 TO 1 TABLET 2 TIMES A DAY.  20 each  0  . zolpidem (AMBIEN) 5 MG tablet Take 5 mg by mouth at bedtime as needed.      . [DISCONTINUED] zolpidem (AMBIEN) 5 MG tablet Take 1 tablet (5 mg total) by mouth at bedtime as needed for sleep.  14 tablet  1    BP 120/78  Pulse 64  Resp 18  Ht 5\' 3"  (1.6 m)  Wt 125 lb (56.7 kg)  BMI 22.14 kg/m2  SpO2 98% General: NAD Neck: No JVD, no thyromegaly or thyroid nodule.  Lungs: Clear to auscultation bilaterally with normal respiratory effort. CV: Nondisplaced PMI.  Heart regular S1/S2, no S3, +S4, no murmur.  No peripheral edema.  No carotid bruit.  Normal pedal pulses.  Abdomen: Soft, nontender, no hepatosplenomegaly, no distention.  Neurologic: Alert and oriented x 3.  Psych: Normal affect. Extremities: No clubbing or cyanosis.   Assessment/Plan:  HYPERTENSION  BP is normal today but apparently still runs high at times when checked at home.  Her BP meds were recently adjusted.  I will have her check her BP daily with her home cuff for 2  wks and we will call at that time to see what her numbers are running.    HYPERLIPIDEMIA Lipids excellent on atorvastatin.   Marca Ancona 10/12/2012

## 2012-10-12 NOTE — Patient Instructions (Addendum)
Your physician wants you to follow-up in: ONE YEAR WITH DR [facility] You will receive a reminder letter in the mail two months in advance. If you don't receive a letter, please call our office to schedule the follow-up appointment.   TRACK AND RECORD BLOOD PRESSURE READINGS

## 2012-10-13 ENCOUNTER — Other Ambulatory Visit: Payer: Self-pay | Admitting: Internal Medicine

## 2012-10-26 ENCOUNTER — Telehealth: Payer: Self-pay | Admitting: *Deleted

## 2012-10-26 NOTE — Telephone Encounter (Signed)
HYPERTENSION 10/12/12 BP is normal today but apparently still runs high at times when checked at home. Her BP meds were recently adjusted. I will have her check her BP daily with her home cuff for 2 wks and we will call at that time to see what her numbers are running.

## 2012-10-26 NOTE — Telephone Encounter (Signed)
BP seems ok

## 2012-10-26 NOTE — Telephone Encounter (Signed)
10/26/12 Spoke with pt. Pt states BP has been averaging 120-140/60-80 range. I will forward to Dr Shirlee Latch for review.

## 2012-10-26 NOTE — Telephone Encounter (Signed)
LM on voice ID voice mail BP seems to be OK.

## 2012-11-26 ENCOUNTER — Other Ambulatory Visit: Payer: Self-pay | Admitting: Internal Medicine

## 2012-11-26 MED ORDER — LOSARTAN POTASSIUM 100 MG PO TABS: 100.0000 mg | ORAL_TABLET | Freq: Every day | ORAL | Status: DC

## 2012-12-26 ENCOUNTER — Encounter: Payer: Self-pay | Admitting: Family

## 2012-12-26 ENCOUNTER — Ambulatory Visit (INDEPENDENT_AMBULATORY_CARE_PROVIDER_SITE_OTHER): Payer: Medicare Other | Admitting: Family

## 2012-12-26 VITALS — BP 132/80 | HR 73 | Temp 97.4°F | Resp 16 | Wt 121.8 lb

## 2012-12-26 DIAGNOSIS — R42 Dizziness and giddiness: Secondary | ICD-10-CM | POA: Diagnosis not present

## 2012-12-26 MED ORDER — MECLIZINE HCL 25 MG PO TABS: ORAL_TABLET | ORAL | Status: DC

## 2012-12-26 NOTE — Assessment & Plan Note (Signed)
Recommended meclizine 1/2 tab TID PRN and addition of claritin once daily for the next week.  We did discuss signs of stroke and she was instructed to go to the ED if this occurs.

## 2012-12-26 NOTE — Patient Instructions (Addendum)
Call if symptom worsen or if not improved in 2-3 days.  Go to ER if you develop numbness weakness, facial drooping or slurred speech.  Vertigo Vertigo means you feel like you or your surroundings are moving when they are not. Vertigo can be dangerous if it occurs when you are at work, driving, or performing difficult activities.  CAUSES  Vertigo occurs when there is a conflict of signals sent to your brain from the visual and sensory systems in your body. There are many different causes of vertigo, including:  Infections, especially in the inner ear.  A bad reaction to a drug or misuse of alcohol and medicines.  Withdrawal from drugs or alcohol.  Rapidly changing positions, such as lying down or rolling over in bed.  A migraine headache.  Decreased blood flow to the brain.  Increased pressure in the brain from a head injury, infection, tumor, or bleeding. SYMPTOMS  You may feel as though the world is spinning around or you are falling to the ground. Because your balance is upset, vertigo can cause nausea and vomiting. You may have involuntary eye movements (nystagmus). DIAGNOSIS  Vertigo is usually diagnosed by physical exam. If the cause of your vertigo is unknown, your caregiver may perform imaging tests, such as an MRI scan (magnetic resonance imaging). TREATMENT  Most cases of vertigo resolve on their own, without treatment. Depending on the cause, your caregiver may prescribe certain medicines. If your vertigo is related to body position issues, your caregiver may recommend movements or procedures to correct the problem. In rare cases, if your vertigo is caused by certain inner ear problems, you may need surgery. HOME CARE INSTRUCTIONS   Follow your caregiver's instructions.  Avoid driving.  Avoid operating heavy machinery.  Avoid performing any tasks that would be dangerous to you or others during a vertigo episode.  Tell your caregiver if you notice that certain medicines  seem to be causing your vertigo. Some of the medicines used to treat vertigo episodes can actually make them worse in some people. SEEK IMMEDIATE MEDICAL CARE IF:   Your medicines do not relieve your vertigo or are making it worse.  You develop problems with talking, walking, weakness, or using your arms, hands, or legs.  You develop severe headaches.  Your nausea or vomiting continues or gets worse.  You develop visual changes.  A family member notices behavioral changes.  Your condition gets worse. MAKE SURE YOU:  Understand these instructions.  Will watch your condition.  Will get help right away if you are not doing well or get worse. Document Released: 08/14/2005 Document Revised: 01/27/2012 Document Reviewed: 05/23/2011 [facility] Patient Information 2013 [place], [place].

## 2012-12-26 NOTE — Progress Notes (Signed)
Subjective:    Patient ID: [name], female    DOB: February 29, 1941, 72 y.o.   MRN: [id number]  HPI  [name] is a 72 yr old female who presents today with chief complaint of dizziness. "caught a cold" on Wednesday.  She took a sudafed prior to going to the [employer].  Became dizzy when she put her head in the shampoo bowl.  Then improved.  Yesterday symptoms worsened. Reports associated dry cough with her cold, but no drainage. She reports + sinus pressure x 3 days without drainage.       Review of Systems See HPI  Past Medical History  Diagnosis Date  . HYPERPARATHYROIDISM UNSPECIFIED 10/20/2007  . HYPERLIPIDEMIA 07/07/2007  . ANEMIA-NOS 07/07/2007  . HYPERTENSION 02/04/2008  . DIVERTICULOSIS, COLON 02/23/2009  . UTI'S, RECURRENT 11/20/2009    kimbrough   . OSTEOPOROSIS 01/01/2008  . SCOLIOSIS 05/25/2009  . FATIGUE 05/25/2009  . PALPITATIONS, OCCASIONAL 07/12/2008  . URINARY INCONTINENCE 07/07/2007  . ADVERSE REACTION TO MEDICATION 07/31/2009  . COLONIC POLYPS, ADENOMATOUS, HX OF 02/23/2009  . NEPHROLITHIASIS, HX OF 11/15/2008  . PARATHYROIDECTOMY 01/02/2009  . Seasonal allergies   . Unspecified constipation   . Other acquired absence of organ     parathyroidectomy  . Fever, unspecified   . Abdominal pain, unspecified site   . Disturbance of skin sensation   . Pain in limb   . Chest pain, unspecified   . Family history of diabetes mellitus   . Family history of malignant neoplasm of breast     History   Social History  . Marital Status: Married    Spouse Name: N/A    Number of Children: 5  . Years of Education: N/A   Occupational History  . travel agent    Social History Main Topics  . Smoking status: Never Smoker   . Smokeless tobacco: Never Used  . Alcohol Use: Yes     Comment: occ. wine  . Drug Use: No  . Sexually Active: Not on file   Other Topics Concern  . Not on file   Social History Narrative   HH of 5    Kids at home now   Married   travel agent.    2-3 c coffee in am    ocass wine    G3P2vaginal delivery   Pt cell [phone]                   Past Surgical History  Procedure Laterality Date  . Right oophorectomy    . Parathyroidectomy      Rt Superior open neck exploration    Family History  Problem Relation Age of Onset  . Breast cancer Other     1st degree relative <50  . Diabetes      1st degree relative  . Sudden death Father 34  . Heart disease Mother     valve replacement    Allergies  Allergen Reactions  . Lisinopril     REACTION: cough    Current Outpatient Prescriptions on File Prior to Visit  Medication Sig Dispense Refill  . amLODipine (NORVASC) 2.5 MG tablet Take 1 tablet (2.5 mg total) by mouth daily.  90 tablet  1  . aspirin 81 MG tablet Take 81 mg by mouth daily.        [name] atorvastatin (LIPITOR) 20 MG tablet Take 1 tablet (20 mg total) by mouth daily.  90 tablet  1  . losartan (COZAAR) 100 MG  tablet Take 1 tablet (100 mg total) by mouth daily.  30 tablet  5  . VALIUM 2 MG tablet TAKE 1/2 TO 1 TABLET 2 TIMES A DAY.  20 each  0  . zolpidem (AMBIEN) 5 MG tablet Take 5 mg by mouth at bedtime as needed.       No current facility-administered medications on file prior to visit.    BP 132/80  Pulse 73  Temp(Src) 97.4 F (36.3 C) (Oral)  Resp 16  Wt 121 lb 12 oz (55.225 kg)  BMI 21.57 kg/m2  SpO2 97%       Objective:   Physical Exam  Constitutional: She appears well-developed and well-nourished. No distress.  HENT:  Head: Normocephalic and atraumatic.  Right Ear: Tympanic membrane and ear canal normal.  Left Ear: Tympanic membrane and ear canal normal.  Mouth/Throat: No oropharyngeal exudate, posterior oropharyngeal edema or posterior oropharyngeal erythema.  Cardiovascular: Normal rate and regular rhythm.   No murmur heard. Pulmonary/Chest: Effort normal and breath sounds normal. No respiratory distress. She has no wheezes. She has no rales. She exhibits no tenderness.  Musculoskeletal:  She exhibits no edema.  Neurological: She has normal strength.  + facial symmetry, tongue midline.  Psychiatric: She has a normal mood and affect. Her behavior is normal. Judgment and thought content normal.          Assessment & Plan:

## 2012-12-28 ENCOUNTER — Telehealth: Payer: Self-pay | Admitting: Internal Medicine

## 2012-12-28 NOTE — Telephone Encounter (Signed)
[facility] Triage Call Report Triage Record Num: [id number] Operator: Roselyn Meier Patient Name: [name] Call Date & Time: 12/26/2012 8:27:29AM Patient Phone: [phone] PCP: Neta Mends. Panosh Patient Gender: Female PCP Fax : [phone] Patient DOB: 05/22/1941 [facility] Name: [facility] Reason for Call: Caller: [name]/Patient; PCP: Berniece Andreas (Family Practice); CB#: [phone]; Call regarding Cough/Congestion; Pt reports cold symptoms began on 12/17/12. Vertigo type symptoms started on 12/25/12. Pt states she cannot walk in a straight line. Triaged patient per Dizziness or Vertigo Protocol. See Provider within 4 Hours Disposition for 'Having sensations of turning or spinning that affects balance and not responsive to 4 hours of home care'. Care advice given and appt scheduled at 915 today (12/26/12) at the [facility] clinic Protocol(s) Used: Dizziness or Vertigo Recommended Outcome per Protocol: See Provider within 4 hours Reason for Outcome: Having sensations of turning or spinning that affects balance AND not responsive to 4 hours of home care Care Advice: ~ DO NOT drive until condition evaluated. ~ Call provider if symptoms worsen or new symptoms develop. 02/

## 2013-01-02 ENCOUNTER — Other Ambulatory Visit: Payer: Self-pay

## 2013-03-15 ENCOUNTER — Telehealth: Payer: Self-pay | Admitting: Internal Medicine

## 2013-03-15 NOTE — Telephone Encounter (Signed)
Pt is taking a trip to [place] in June and would like to have her VALIUM 2 MG tablet refilled. Pharm: [facility]

## 2013-03-15 NOTE — Telephone Encounter (Signed)
Last seen: 09/11/12 Last filled: 12/24/11 #20 with 0 additional refills No follow up scheduled Please advise.  Thanks!!

## 2013-03-15 NOTE — Telephone Encounter (Signed)
Ok to refill x 1  

## 2013-03-16 ENCOUNTER — Other Ambulatory Visit: Payer: Self-pay | Admitting: Internal Medicine

## 2013-03-16 MED ORDER — DIAZEPAM 2 MG PO TABS: ORAL_TABLET | ORAL | Status: DC

## 2013-03-16 NOTE — Telephone Encounter (Signed)
Called and left on voicemail. 

## 2013-04-23 ENCOUNTER — Other Ambulatory Visit: Payer: Self-pay | Admitting: Internal Medicine

## 2013-04-29 ENCOUNTER — Encounter: Payer: Self-pay | Admitting: Internal Medicine

## 2013-04-29 ENCOUNTER — Ambulatory Visit (INDEPENDENT_AMBULATORY_CARE_PROVIDER_SITE_OTHER): Payer: Medicare Other | Admitting: Internal Medicine

## 2013-04-29 VITALS — BP 140/80 | HR 60 | Temp 98.5°F | Wt 122.0 lb

## 2013-04-29 DIAGNOSIS — M4120 Other idiopathic scoliosis, site unspecified: Secondary | ICD-10-CM

## 2013-04-29 DIAGNOSIS — M549 Dorsalgia, unspecified: Secondary | ICD-10-CM

## 2013-04-29 NOTE — Progress Notes (Signed)
Chief Complaint  Patient presents with  . Back Pain    On the left side near her hip.  Started 3 weeks ago.  She started [employer].    HPI: Patient comes in today for SDA for  new problem evaluation. Onset    3 weeks. Ago May 21 after silver sneakers class ( not that vigorous) left low pback poain that sometims radiates to side of leg to knee  And worse with sitting and walking if leaning forward.      Took voltarin  And jhelped   alonmst gone   About 99 % better .   Worse when sits   Or twist a certain way  Back hurts when going up hill   Off of the voltaren.Now . In the past had a similar pain related to kidney with ? Stone.   Has  scoliosis   Bp has been good and stable at this time.  ROS: See pertinent positives and negatives per HPI.  Past Medical History  Diagnosis Date  . HYPERPARATHYROIDISM UNSPECIFIED 10/20/2007  . HYPERLIPIDEMIA 07/07/2007  . ANEMIA-NOS 07/07/2007  . HYPERTENSION 02/04/2008  . DIVERTICULOSIS, COLON 02/23/2009  . UTI'S, RECURRENT 11/20/2009    kimbrough   . OSTEOPOROSIS 01/01/2008  . SCOLIOSIS 05/25/2009  . FATIGUE 05/25/2009  . PALPITATIONS, OCCASIONAL 07/12/2008  . URINARY INCONTINENCE 07/07/2007  . ADVERSE REACTION TO MEDICATION 07/31/2009  . COLONIC POLYPS, ADENOMATOUS, HX OF 02/23/2009  . NEPHROLITHIASIS, HX OF 11/15/2008  . PARATHYROIDECTOMY 01/02/2009  . Seasonal allergies   . Unspecified constipation   . Other acquired absence of organ     parathyroidectomy  . Fever, unspecified   . Abdominal pain, unspecified site   . Disturbance of skin sensation   . Pain in limb   . Chest pain, unspecified   . Family history of diabetes mellitus   . Family history of malignant neoplasm of breast     Family History  Problem Relation Age of Onset  . Breast cancer Other     1st degree relative <50  . Diabetes      1st degree relative  . Sudden death Father 62  . Heart disease Mother     valve replacement    History   Social History  . Marital  Status: Married    Spouse Name: N/A    Number of Children: 5  . Years of Education: N/A   Occupational History  . travel agent    Social History Main Topics  . Smoking status: Never Smoker   . Smokeless tobacco: Never Used  . Alcohol Use: Yes     Comment: occ. wine  . Drug Use: No  . Sexually Active: None   Other Topics Concern  . None   Social History Narrative   HH of 5    Kids at home now   Married   travel agent.    2-3 c coffee in am    ocass wine    G3P2vaginal delivery   Pt cell [phone]                   Outpatient Encounter Prescriptions as of 04/29/2013  Medication Sig Dispense Refill  . amLODipine (NORVASC) 2.5 MG tablet TAKE 1 TABLET ONCE DAILY.  90 tablet  0  . aspirin 81 MG tablet Take 81 mg by mouth daily.        [name] atorvastatin (LIPITOR) 20 MG tablet Take 1 tablet (20 mg total) by mouth daily.  90  tablet  1  . diazepam (VALIUM) 2 MG tablet TAKE 1/2 TO 1 TABLET 2 TIMES A DAY.  20 tablet  0  . losartan (COZAAR) 100 MG tablet Take 50 mg by mouth daily.      [name] zolpidem (AMBIEN) 5 MG tablet Take 5 mg by mouth at bedtime as needed.      . [DISCONTINUED] losartan (COZAAR) 100 MG tablet Take 1 tablet (100 mg total) by mouth daily.  30 tablet  5  . meclizine (MEDI-MECLIZINE) 25 MG tablet 1/2 tablet 3 times a day as needed  30 tablet  0   No facility-administered encounter medications on file as of 04/29/2013.    EXAM:  BP 140/80  Pulse 60  Temp(Src) 98.5 F (36.9 C) (Oral)  Wt 122 lb (55.339 kg)  BMI 21.62 kg/m2  SpO2 98%  Body mass index is 21.62 kg/(m^2).  GENERAL: vitals reviewed and listed above, alert, oriented, appears well hydrated and in no acute distress Back  Points to left si area pf [aion  Has scoliosis and tilted hip walking good rom  MS: moves all extremities without noticeable focal  Abnormality Neg slr toe heel strength good Groin no masses no acute joint swelling  PSYCH: pleasant and cooperative, no obvious depression or  anxiety  ASSESSMENT AND PLAN:  Discussed the following assessment and plan:  Back pain with radiation - improved after 3 weeks self rx with voltaren seems mechanichal sciatic ? - Plan: POCT urinalysis dipstick  SCOLIOSIS Unable to get ua today  But i think not UT problem   Observation  Consider seeing [facility]  Or [place] if worsening fever etc  Exp[ect continued improvement -Patient advised to return or notify health care team  if symptoms worsen or persist or new concerns arise.  Patient Instructions  This acts like mechanical back p[ain  Like sciatica . don't think is related to your kidney at this time.  Suggest see sports medicine clinic Dr Doristine Church [place] [place] at [facility]  For further evaluation if not resolving  In another 1-2 weeks or recurring . Sometimes scoliosis can  Aggravate gait problems that set you up for back pain.      Neta Mends. Josephus Harriger M.D.

## 2013-04-29 NOTE — Patient Instructions (Signed)
This acts like mechanical back p[ain  Like sciatica . don't think is related to your kidney at this time.  Suggest see sports medicine clinic Dr Doristine [place] [place] at [facility]  For further evaluation if not resolving  In another 1-2 weeks or recurring . Sometimes scoliosis can  Aggravate gait problems that set you up for back pain.

## 2013-04-30 ENCOUNTER — Telehealth: Payer: Self-pay | Admitting: Internal Medicine

## 2013-04-30 NOTE — Telephone Encounter (Signed)
Patient wants to schedule OV for bowel control issues. States she has had some "accidents" and wants to see Dr. Juanda Chance. Hx of constipation and diarrhea alternating. Scheduled on 05/04/13 at 1:45 PM.

## 2013-05-04 ENCOUNTER — Ambulatory Visit (INDEPENDENT_AMBULATORY_CARE_PROVIDER_SITE_OTHER): Payer: Medicare Other | Admitting: Internal Medicine

## 2013-05-04 ENCOUNTER — Encounter: Payer: Self-pay | Admitting: Internal Medicine

## 2013-05-04 VITALS — BP 162/82 | HR 72 | Ht 62.0 in | Wt 123.5 lb

## 2013-05-04 DIAGNOSIS — Z86010 Personal history of colonic polyps: Secondary | ICD-10-CM | POA: Diagnosis not present

## 2013-05-04 DIAGNOSIS — K5900 Constipation, unspecified: Secondary | ICD-10-CM

## 2013-05-04 MED ORDER — PEG-KCL-NACL-NASULF-NA ASC-C 100 G PO SOLR: 1.0000 | Freq: Once | ORAL | Status: DC

## 2013-05-04 NOTE — Patient Instructions (Addendum)
You have been scheduled for a colonoscopy with propofol. Please follow written instructions given to you at your visit today.  Please pick up your prep kit at the pharmacy within the next 1-3 days. If you use inhalers (even only as needed), please bring them with you on the day of your procedure. Your physician has requested that you go to www.startemmi.com and enter the access code given to you at your visit today. This web site gives a general overview about your procedure. However, you should still follow specific instructions given to you by our office regarding your preparation for the procedure.  Take over the counter Benefiber 1 tablespoon every morning and/or prune juice 2 ounces three times a week.  High-Fiber Diet Fiber is found in fruits, vegetables, and grains. A high-fiber diet encourages the addition of more whole grains, legumes, fruits, and vegetables in your diet. The recommended amount of fiber for adult males is 38 g per day. For adult females, it is 25 g per day. Pregnant and lactating women should get 28 g of fiber per day. If you have a digestive or bowel problem, ask your caregiver for advice before adding high-fiber foods to your diet. Eat a variety of high-fiber foods instead of only a select few type of foods.  PURPOSE  To increase stool bulk.  To make bowel movements more regular to prevent constipation.  To lower cholesterol.  To prevent overeating. WHEN IS THIS DIET USED?  It may be used if you have constipation and hemorrhoids.  It may be used if you have uncomplicated diverticulosis (intestine condition) and irritable bowel syndrome.  It may be used if you need help with weight management.  It may be used if you want to add it to your diet as a protective measure against atherosclerosis, diabetes, and cancer. SOURCES OF FIBER  Whole-grain breads and cereals.  Fruits, such as apples, oranges, bananas, berries, prunes, and pears.  Vegetables, such as green  peas, carrots, sweet potatoes, beets, broccoli, cabbage, spinach, and artichokes.  Legumes, such split peas, soy, lentils.  Almonds. FIBER CONTENT IN FOODS Starches and Grains / Dietary Fiber (g)  Cheerios, 1 cup / 3 g  Corn Flakes cereal, 1 cup / 0.7 g  Rice crispy treat cereal, 1 cup / 0.3 g  Instant oatmeal (cooked),  cup / 2 g  Frosted wheat cereal, 1 cup / 5.1 g  Brown, long-grain rice (cooked), 1 cup / 3.5 g  White, long-grain rice (cooked), 1 cup / 0.6 g  Enriched macaroni (cooked), 1 cup / 2.5 g Legumes / Dietary Fiber (g)  Baked beans (canned, plain, or vegetarian),  cup / 5.2 g  Kidney beans (canned),  cup / 6.8 g  Pinto beans (cooked),  cup / 5.5 g Breads and Crackers / Dietary Fiber (g)  Plain or honey graham crackers, 2 squares / 0.7 g  Saltine crackers, 3 squares / 0.3 g  Plain, salted pretzels, 10 pieces / 1.8 g  Whole-wheat bread, 1 slice / 1.9 g  White bread, 1 slice / 0.7 g  Raisin bread, 1 slice / 1.2 g  Plain bagel, 3 oz / 2 g  Flour tortilla, 1 oz / 0.9 g  Corn tortilla, 1 small / 1.5 g  Hamburger or hotdog bun, 1 small / 0.9 g Fruits / Dietary Fiber (g)  Apple with skin, 1 medium / 4.4 g  Sweetened applesauce,  cup / 1.5 g  Banana,  medium / 1.5 g  Grapes, 10  grapes / 0.4 g  Orange, 1 small / 2.3 g  Raisin, 1.5 oz / 1.6 g  Melon, 1 cup / 1.4 g Vegetables / Dietary Fiber (g)  Green beans (canned),  cup / 1.3 g  Carrots (cooked),  cup / 2.3 g  Broccoli (cooked),  cup / 2.8 g  Peas (cooked),  cup / 4.4 g  Mashed potatoes,  cup / 1.6 g  Lettuce, 1 cup / 0.5 g  Corn (canned),  cup / 1.6 g  Tomato,  cup / 1.1 g Document Released: 11/04/2005 Document Revised: 05/05/2012 Document Reviewed: 02/06/2012 [facility] Patient Information 2014 [place], [place].  Cc: Dr Fabian Sharp

## 2013-05-04 NOTE — Progress Notes (Signed)
[name] 73-17-1942 MRN [id number]        History of Present Illness:  This is a 72 year old white female with chronic constipation. She has recently lost 5 pounds as a result of exercising and eating less. She now has a bowel movement every 4-5 days and feels very bloated and full prior to having a bowel movement. She sometimes takes prune juice 6 ounces which causes her to have severe diarrhea and rapid evacuation, and occasional incontinence. Associated with nausea. She has not taken any laxatives. She had 2 prior colonoscopies by Dr. Madilyn Stein first one about 10 years ago showed polyps. The second one in 2009 did not show polyps .She just received a letter for recall colonoscopy. She had a CT scan of the abdomen in April 2006 for left renal calculi. There was no active disease    Past Medical History  Diagnosis Date  . HYPERPARATHYROIDISM UNSPECIFIED 10/20/2007  . HYPERLIPIDEMIA 07/07/2007  . ANEMIA-NOS 07/07/2007  . HYPERTENSION 02/04/2008  . DIVERTICULOSIS, COLON 02/23/2009  . UTI'S, RECURRENT 11/20/2009    kimbrough   . OSTEOPOROSIS 01/01/2008  . SCOLIOSIS 05/25/2009  . FATIGUE 05/25/2009  . PALPITATIONS, OCCASIONAL 07/12/2008  . URINARY INCONTINENCE 07/07/2007  . ADVERSE REACTION TO MEDICATION 07/31/2009  . COLONIC POLYPS, ADENOMATOUS, HX OF   . NEPHROLITHIASIS, HX OF 11/15/2008  . PARATHYROIDECTOMY 01/02/2009  . Seasonal allergies   . Unspecified constipation   . Other acquired absence of organ     parathyroidectomy  . Fever, unspecified   . Abdominal pain, unspecified site   . Disturbance of skin sensation   . Pain in limb   . Chest pain, unspecified   . Family history of diabetes mellitus   . Family history of malignant neoplasm of breast    Past Surgical History  Procedure Laterality Date  . Right oophorectomy    . Parathyroidectomy      Rt Superior open neck exploration    reports that she has never smoked. She has never used smokeless tobacco. She reports that  drinks  alcohol. She reports that she does not use illicit drugs. family history includes Breast cancer in her other; Diabetes in an unspecified family member; Heart disease in her mother; and Sudden death (age of onset: 15) in her father. Allergies  Allergen Reactions  . Lisinopril     REACTION: cough        Review of Systems: Occasional nausea prior to having a bowel movement. Weight loss of 5 pounds  The remainder of the 10 point ROS is negative except as outlined in H&P   Physical Exam: General appearance  Well developed, in no distress. Eyes- non icteric. HEENT nontraumatic, normocephalic. Mouth no lesions, tongue papillated, no cheilosis. Neck supple without adenopathy, thyroid not enlarged, no carotid bruits, no JVD. Lungs Clear to auscultation bilaterally. Cor normal S1, normal S2, regular rhythm, no murmur,  quiet precordium. Abdomen: Scaphoid soft with mild tenderness in the left lower and left middle quadrant. No palpable mass. Right upper and lower quadrants unremarkable Rectal: Large amount of soft Hemoccult negative stool Extremities no pedal edema. Skin no lesions. Neurological alert and oriented x 3. Psychological normal mood and affect.  Assessment and Plan:  72 year old white female with chronic constipation which has been worsening as a result of a weight loss. She is Hemoccult-negative. She has history of colon polyps but no family history of colon cancer. She is due for recall colonoscopy. Her dietary history consists of low in fiber. We will start  her on Benefiber 1 tablespoon every morning. I agree with using prune juice but we will reduce the dose to 2 ounces at a time 3 times a week to a voided dramatic evacuations resulting in incontinence.[name] She will be scheduled for colonoscopy. Patient agrees with the plan   05/04/2013 [name]

## 2013-06-08 ENCOUNTER — Ambulatory Visit (INDEPENDENT_AMBULATORY_CARE_PROVIDER_SITE_OTHER): Payer: Medicare Other | Admitting: Sports Medicine

## 2013-06-08 ENCOUNTER — Encounter: Payer: Self-pay | Admitting: Sports Medicine

## 2013-06-08 VITALS — BP 136/79 | HR 53 | Ht 62.0 in | Wt 123.0 lb

## 2013-06-08 DIAGNOSIS — M4120 Other idiopathic scoliosis, site unspecified: Secondary | ICD-10-CM | POA: Diagnosis not present

## 2013-06-08 NOTE — Assessment & Plan Note (Signed)
I think this is the origin of most of her back pain  This throws off pelvic position  She has a drop shoulder with gait/ leg lengths are not actually abnormal -- this arises from scoliosis  I think she should try an exercise and home stretching series  If we can lessen progression of curve and block kyphosis from developing I think she will have less low back pain  Reck 6 wks

## 2013-06-08 NOTE — Patient Instructions (Addendum)
Use 2 lb dumbbells for exercises  With arms out by your side: repeat 8 times each lateral bend to right and left Side to side rotations Right elbow to left knee, left elbow to right knee With hands up at shoulders rotate to the right and left Hold weight in front of you, against chest- lean forward and backward  Lying on floor stretch arms out to the side, and over head - hold 5 seconds each- repeat 5 times  Please follow up in 6 weeks   Thank you for seeing [place] today!

## 2013-06-08 NOTE — Progress Notes (Signed)
Patient ID: [name], female   DOB: 22-Dec-1940, 72 y.o.   MRN: [id number]  72 YO w long Hx of LBP - for 15 yrs or so No pain today May hurt 3 to 4 days per week but advil or voltaren will knock it out  Can get severe - up to a 9/10 When that bad voltaren and rest are needed  Certain exercises even in a chair hurt back Making bed hurts back  Radiates to the front of either the RT or LT leg  No radiation past knee  HX of urinary tract issues but not cleared assoc w the back pain  Hx of osteoporosis Fx arm and wrist T score is - 3.0 Took 5 yrs + of actonel    Physical exam: NAD  Rt shoulder down  Pelvis rotated anteriorly on rt Rt hip 2 cm lower than lt Curve concave to rt, convex to left Lumbar curves straightens with forward flexion, but lt shoulder sits high Lt side very little lateral bend, increased lateral bend to the rt Rotation side to side normal FABER mildly tight on rt, very tight on lt Straight leg raise good bilat Good hip rotation on rt Lt hip- IR limited, ER normal Good hip flexion bilat  Walking gait Rt shoulder lower than lt Leg lengths equal

## 2013-06-09 ENCOUNTER — Other Ambulatory Visit: Payer: Self-pay | Admitting: Internal Medicine

## 2013-06-09 DIAGNOSIS — H02409 Unspecified ptosis of unspecified eyelid: Secondary | ICD-10-CM | POA: Diagnosis not present

## 2013-06-09 DIAGNOSIS — H2640 Unspecified secondary cataract: Secondary | ICD-10-CM | POA: Diagnosis not present

## 2013-06-23 ENCOUNTER — Other Ambulatory Visit: Payer: Self-pay

## 2013-07-07 ENCOUNTER — Ambulatory Visit (AMBULATORY_SURGERY_CENTER): Payer: Medicare Other | Admitting: Internal Medicine

## 2013-07-07 ENCOUNTER — Encounter: Payer: Medicare Other | Admitting: Internal Medicine

## 2013-07-07 ENCOUNTER — Encounter: Payer: Self-pay | Admitting: Internal Medicine

## 2013-07-07 VITALS — BP 183/71 | HR 52 | Temp 97.9°F | Resp 17 | Ht 62.0 in | Wt 123.0 lb

## 2013-07-07 DIAGNOSIS — I10 Essential (primary) hypertension: Secondary | ICD-10-CM | POA: Diagnosis not present

## 2013-07-07 DIAGNOSIS — R002 Palpitations: Secondary | ICD-10-CM | POA: Diagnosis not present

## 2013-07-07 DIAGNOSIS — D126 Benign neoplasm of colon, unspecified: Secondary | ICD-10-CM | POA: Diagnosis not present

## 2013-07-07 DIAGNOSIS — D649 Anemia, unspecified: Secondary | ICD-10-CM | POA: Diagnosis not present

## 2013-07-07 DIAGNOSIS — Z86010 Personal history of colonic polyps: Secondary | ICD-10-CM

## 2013-07-07 MED ORDER — SODIUM CHLORIDE 0.9 % IV SOLN: 500.0000 mL | INTRAVENOUS | Status: DC

## 2013-07-07 NOTE — Progress Notes (Signed)
Called to room to assist during endoscopic procedure.  Patient ID and intended procedure confirmed with present staff. Received instructions for my participation in the procedure from the performing physician.  

## 2013-07-07 NOTE — Progress Notes (Signed)
Patient did not experience any of the following events: a burn prior to discharge; a fall within the facility; wrong site/side/patient/procedure/implant event; or a hospital transfer or hospital admission upon discharge from the facility. (G8907) Patient did not have preoperative order for IV antibiotic SSI prophylaxis. (G8918)  

## 2013-07-07 NOTE — Op Note (Signed)
[facility] [address] [place], [address]   COLONOSCOPY PROCEDURE REPORT  PATIENT: [name], [name]  MR#: [id number] BIRTHDATE: 30-Dec-1940 , 72  yrs. old GENDER: Female ENDOSCOPIST: Hart Carwin, MD REFERRED XB:JYNWG Lonie Peak, M.D. PROCEDURE DATE:  07/07/2013 PROCEDURE:   Colonoscopy with snare polypectomy First Screening Colonoscopy - Avg.  risk and is 50 yrs.  old or older - No.  Prior Negative Screening - Now for repeat screening. N/A  History of Adenoma - Now for follow-up colonoscopy & has been > or = to 3 yrs.  N/A  Polyps Removed Today? Yes. ASA CLASS:   Class II INDICATIONS:prior colonoscopy 2000, and 2001- polyp ( path report not available), no change in bowl habits. MEDICATIONS: MAC sedation, administered by CRNA and propofol (Diprivan) 250mg  IV  DESCRIPTION OF PROCEDURE:   After the risks benefits and alternatives of the procedure were thoroughly explained, informed consent was obtained.  A digital rectal exam revealed no abnormalities of the rectum.   The LB [id number]-[id number]  endoscope was introduced through the anus and advanced to the cecum, which was identified by both the appendix and ileocecal valve. No adverse events experienced.   The quality of the prep was Moviprep fair The instrument was then slowly withdrawn as the colon was fully examined.      COLON FINDINGS: A sessile polyp measuring 20 mm in size was found at the cecum.it was located within the appendiceal opening. It was carpeted sessile polyp which was removed in multiple pieces with a hot snare heart of the polyp was extending deep into the appendix we have removed the arts of the polyps which were located outside of the appendiceal opening we did not attempt to remove any parts of the tissue from the appendiceal lumen because of risk of perforation  Retroflexed views revealed no abnormalities. The time to cecum=11 minutes 40 seconds.  Withdrawal time=10 minutes  47 seconds.  The scope was withdrawn and the procedure completed. COMPLICATIONS: There were no complications.  ENDOSCOPIC IMPRESSION: Sessile polyp measuring 20 mm in size was found at the cecum within the appendiceal opening. Multiple pieces of the carpeted polyp was removed with a hot snare. There is possible and residual polyp within the appendiceal lumen  RECOMMENDATIONS: 1.  Await pathology results 2.  High fiber diet 3 no ASA or NSAIDs x 2 weeks 4. repeat colon pending path report,likely within 1 year eSigned:  Hart Carwin, MD 07/07/2013 11:34 AM   cc:   PATIENT NAME:  [name], [name] MR#: [id number]

## 2013-07-07 NOTE — Patient Instructions (Addendum)
Impressions/recommendations:  Polyp (handout given)  High Fiber Diet (handout given) No aspirin or anti-inflammatory medications for 2 weeks.Tylenol only. May resume July 22, 2013.   Repeat colonoscopy pending pathology results.  YOU HAD AN ENDOSCOPIC PROCEDURE TODAY AT THE [facility]: Refer to the procedure report that was given to you for any specific questions about what was found during the examination.  If the procedure report does not answer your questions, please call your gastroenterologist to clarify.  If you requested that your care partner not be given the details of your procedure findings, then the procedure report has been included in a sealed envelope for you to review at your convenience later.  YOU SHOULD EXPECT: Some feelings of bloating in the abdomen. Passage of more gas than usual.  Walking can help get rid of the air that was put into your GI tract during the procedure and reduce the bloating. If you had a lower endoscopy (such as a colonoscopy or flexible sigmoidoscopy) you may notice spotting of blood in your stool or on the toilet paper. If you underwent a bowel prep for your procedure, then you may not have a normal bowel movement for a few days.  DIET: Your first meal following the procedure should be a light meal and then it is ok to progress to your normal diet.  A half-sandwich or bowl of soup is an example of a good first meal.  Heavy or fried foods are harder to digest and may make you feel nauseous or bloated.  Likewise meals heavy in dairy and vegetables can cause extra gas to form and this can also increase the bloating.  Drink plenty of fluids but you should avoid alcoholic beverages for 24 hours.  ACTIVITY: Your care partner should take you home directly after the procedure.  You should plan to take it easy, moving slowly for the rest of the day.  You can resume normal activity the day after the procedure however you should NOT DRIVE or use heavy  machinery for 24 hours (because of the sedation medicines used during the test).    SYMPTOMS TO REPORT IMMEDIATELY: A gastroenterologist can be reached at any hour.  During normal business hours, 8:30 AM to 5:00 PM Monday through Friday, call [phone].  After hours and on weekends, please call the GI answering service at [phone] who will take a message and have the physician on call contact you.   Following lower endoscopy (colonoscopy or flexible sigmoidoscopy):  Excessive amounts of blood in the stool  Significant tenderness or worsening of abdominal pains  Swelling of the abdomen that is new, acute  Fever of 100F or higher  FOLLOW UP: If any biopsies were taken you will be contacted by phone or by letter within the next 1-3 weeks.  Call your gastroenterologist if you have not heard about the biopsies in 3 weeks.  Our staff will call the home number listed on your records the next business day following your procedure to check on you and address any questions or concerns that you may have at that time regarding the information given to you following your procedure. This is a courtesy call and so if there is no answer at the home number and we have not heard from you through the emergency physician on call, we will assume that you have returned to your regular daily activities without incident.  SIGNATURES/CONFIDENTIALITY: You and/or your care partner have signed paperwork which will be entered into your electronic  medical record.  These signatures attest to the fact that that the information above on your After Visit Summary has been reviewed and is understood.  Full responsibility of the confidentiality of this discharge information lies with you and/or your care-partner.

## 2013-07-07 NOTE — Progress Notes (Signed)
Patient denies any allergies to eggs or soy. Patient denies any problems with anesthesia.  

## 2013-07-08 ENCOUNTER — Telehealth: Payer: Self-pay | Admitting: Internal Medicine

## 2013-07-08 ENCOUNTER — Telehealth: Payer: Self-pay

## 2013-07-08 NOTE — Telephone Encounter (Signed)
Patient of Dr Juanda Chance. Had colonoscopy with polypectomy yesterday (reviewed). No problems after. Called tonight with complaint of fever (100.2), chills, and aches. No GI complaints (no N, V, abdominal pain, etc.). Eating well. No cough or SOB. She took tylenol. I told her that it was quite unlikely related to the procedure. Should fever persist or worsen, she was advised to contact her PCP. She agreed.

## 2013-07-08 NOTE — Telephone Encounter (Signed)
  Follow up Call-  Call back number 07/07/2013  Post procedure Call Back phone  # [phone]  Permission to leave phone message Yes     Patient questions:  Do you have a fever, pain , or abdominal swelling? no Pain Score  0 *  Have you tolerated food without any problems? yes  Have you been able to return to your normal activities? yes  Do you have any questions about your discharge instructions: Diet   no Medications  no Follow up visit  no  Do you have questions or concerns about your Care? no  Actions: * If pain score is 4 or above: No action needed, pain <4.  Per the pt her bowels are still "loose".  She said this was not uncommon for her.  I advised her that hopefully getting back to regular diet would help.  If sx persist, to please call us back.  Pt said she would.

## 2013-07-09 ENCOUNTER — Telehealth: Payer: Self-pay | Admitting: Internal Medicine

## 2013-07-09 NOTE — Telephone Encounter (Signed)
FYI

## 2013-07-09 NOTE — Telephone Encounter (Signed)
Patient Information:  Caller Name: Izetta  Phone: [phone]  Patient: [name], [name]  Gender: Female  DOB: 03/12/1941  Age: 72 Years  PCP: Berniece Andreas [facility])  Office Follow Up:  Does the office need to follow up with this patient?: Yes  Instructions For The Office: Please review noted and contact patient if disagree with patient's decision.  RN offered an appointment or to see if she could come and leave a urine sample but patient declined opting to monitor.  RN Note:  Currently, patient is asymptomatic except for lingering lightheadedness from yesterday.  Called due to directions post-procedure from 07/07/13.  Her GI provider does not feel it is related to procedure except maybe slight dehydration.  Patient has had a history of UTI but denies 2 in past year.  Due to age, disposition is to be seen today.  Patient notes she has to be at work at early afternoon. During conversation, patient voiced that she was feeling it was just something to watch.  RN offered appointment, to see if urine sample could be left or reassured her that if symptoms worsened staff would be available for triage.  Patient declined appointment or to bring in sample, but rather to observe for further symptom.  Informed patient that a note would be sent to front office with someone following up with the patient if it was felt something should be done different. Care advice given with caller demonstrating her understanding.  Symptoms  Reason For Call & Symptoms: Low grade temp yesterday 100.8 =orally with some burning on urination then as well.  Slight  lightheadedness.  Post colonoscopy 08/07/13  Reviewed Health History In EMR: Yes  Reviewed Medications In EMR: Yes  Reviewed Allergies In EMR: Yes  Reviewed Surgeries / Procedures: Yes  Date of Onset of Symptoms: 07/08/2013  Guideline(s) Used:  Urination Pain - Female  Disposition Per Guideline:   See Today in Office  Reason For Disposition Reached:    Age > 50 years  Advice Given:  Fluids:   Drink extra fluids. Drink 8-10 glasses of liquids a day (Reason: to produce a dilute, non-irritating urine).  Call Back If:  You become worse.  Patient Refused Recommendation:  Patient Will Follow Up With Office Later  Patient declined appointment.

## 2013-07-13 ENCOUNTER — Encounter: Payer: Self-pay | Admitting: Internal Medicine

## 2013-07-20 ENCOUNTER — Ambulatory Visit: Payer: Medicare Other | Admitting: Sports Medicine

## 2013-08-05 ENCOUNTER — Ambulatory Visit: Payer: Medicare Other | Admitting: Sports Medicine

## 2013-08-23 DIAGNOSIS — D239 Other benign neoplasm of skin, unspecified: Secondary | ICD-10-CM | POA: Diagnosis not present

## 2013-08-23 DIAGNOSIS — D1801 Hemangioma of skin and subcutaneous tissue: Secondary | ICD-10-CM | POA: Diagnosis not present

## 2013-08-23 DIAGNOSIS — L820 Inflamed seborrheic keratosis: Secondary | ICD-10-CM | POA: Diagnosis not present

## 2013-08-23 DIAGNOSIS — L821 Other seborrheic keratosis: Secondary | ICD-10-CM | POA: Diagnosis not present

## 2013-08-23 DIAGNOSIS — Z85828 Personal history of other malignant neoplasm of skin: Secondary | ICD-10-CM | POA: Diagnosis not present

## 2013-08-23 DIAGNOSIS — L739 Follicular disorder, unspecified: Secondary | ICD-10-CM | POA: Diagnosis not present

## 2013-08-25 ENCOUNTER — Ambulatory Visit: Payer: Medicare Other | Admitting: Sports Medicine

## 2013-09-23 ENCOUNTER — Other Ambulatory Visit: Payer: Self-pay

## 2013-10-04 DIAGNOSIS — Z23 Encounter for immunization: Secondary | ICD-10-CM | POA: Diagnosis not present

## 2013-10-09 ENCOUNTER — Other Ambulatory Visit: Payer: Self-pay | Admitting: Internal Medicine

## 2013-10-12 MED ORDER — DIAZEPAM 2 MG PO TABS: ORAL_TABLET | ORAL | Status: DC

## 2013-10-13 ENCOUNTER — Other Ambulatory Visit: Payer: Self-pay | Admitting: Internal Medicine

## 2013-10-26 ENCOUNTER — Ambulatory Visit: Payer: Medicare Other | Admitting: Sports Medicine

## 2013-11-19 ENCOUNTER — Other Ambulatory Visit: Payer: Self-pay | Admitting: Internal Medicine

## 2013-11-24 ENCOUNTER — Other Ambulatory Visit: Payer: Self-pay | Admitting: Internal Medicine

## 2013-11-27 ENCOUNTER — Other Ambulatory Visit: Payer: Self-pay | Admitting: Internal Medicine

## 2013-12-28 ENCOUNTER — Encounter: Payer: Self-pay | Admitting: *Deleted

## 2013-12-29 ENCOUNTER — Encounter: Payer: Self-pay | Admitting: Internal Medicine

## 2013-12-29 ENCOUNTER — Ambulatory Visit (INDEPENDENT_AMBULATORY_CARE_PROVIDER_SITE_OTHER): Payer: Medicare Other | Admitting: Internal Medicine

## 2013-12-29 VITALS — BP 140/90 | HR 58 | Temp 98.5°F | Ht 61.25 in | Wt 124.0 lb

## 2013-12-29 DIAGNOSIS — Z23 Encounter for immunization: Secondary | ICD-10-CM

## 2013-12-29 DIAGNOSIS — G479 Sleep disorder, unspecified: Secondary | ICD-10-CM | POA: Diagnosis not present

## 2013-12-29 DIAGNOSIS — E785 Hyperlipidemia, unspecified: Secondary | ICD-10-CM

## 2013-12-29 DIAGNOSIS — I10 Essential (primary) hypertension: Secondary | ICD-10-CM | POA: Diagnosis not present

## 2013-12-29 DIAGNOSIS — Z0000 Encounter for general adult medical examination without abnormal findings: Secondary | ICD-10-CM | POA: Diagnosis not present

## 2013-12-29 DIAGNOSIS — R42 Dizziness and giddiness: Secondary | ICD-10-CM

## 2013-12-29 DIAGNOSIS — R609 Edema, unspecified: Secondary | ICD-10-CM

## 2013-12-29 LAB — HEPATIC FUNCTION PANEL
ALT: 19 U/L (ref 0–35)
AST: 23 U/L (ref 0–37)
Albumin: 4.3 g/dL (ref 3.5–5.2)
Alkaline Phosphatase: 62 U/L (ref 39–117)
Bilirubin, Direct: 0.1 mg/dL (ref 0.0–0.3)
Total Bilirubin: 1.2 mg/dL (ref 0.3–1.2)
Total Protein: 7.2 g/dL (ref 6.0–8.3)

## 2013-12-29 LAB — BASIC METABOLIC PANEL
BUN: 15 mg/dL (ref 6–23)
CO2: 26 mEq/L (ref 19–32)
Calcium: 9.4 mg/dL (ref 8.4–10.5)
Chloride: 107 mEq/L (ref 96–112)
Creatinine, Ser: 0.7 mg/dL (ref 0.4–1.2)
GFR: 82.87 mL/min (ref >60.00)
Glucose, Bld: 95 mg/dL (ref 70–99)
Potassium: 5 mEq/L (ref 3.5–5.1)
Sodium: 145 mEq/L (ref 135–145)

## 2013-12-29 LAB — CBC WITH DIFFERENTIAL/PLATELET
Basophils Absolute: 0 10*3/uL (ref 0.0–0.1)
Basophils Relative: 0.6 % (ref 0.0–3.0)
Eosinophils Absolute: 0.1 10*3/uL (ref 0.0–0.7)
Eosinophils Relative: 2.2 % (ref 0.0–5.0)
HCT: 44.7 % (ref 36.0–46.0)
Hemoglobin: 14.5 g/dL (ref 12.0–15.0)
Lymphocytes Relative: 27.6 % (ref 12.0–46.0)
Lymphs Abs: 1.5 10*3/uL (ref 0.7–4.0)
MCHC: 32.4 g/dL (ref 30.0–36.0)
MCV: 97.6 fl (ref 78.0–100.0)
Monocytes Absolute: 0.5 10*3/uL (ref 0.1–1.0)
Monocytes Relative: 10.3 % (ref 3.0–12.0)
Neutro Abs: 3.2 10*3/uL (ref 1.4–7.7)
Neutrophils Relative %: 59.3 % (ref 43.0–77.0)
Platelets: 223 10*3/uL (ref 150.0–400.0)
RBC: 4.58 Mil/uL (ref 3.87–5.11)
RDW: 13.7 % (ref 11.5–14.6)
WBC: 5.3 10*3/uL (ref 4.5–10.5)

## 2013-12-29 LAB — LIPID PANEL
Cholesterol: 148 mg/dL (ref 0–200)
HDL: 68.2 mg/dL (ref >39.00)
LDL Cholesterol: 69 mg/dL (ref 0–99)
Total CHOL/HDL Ratio: 2
Triglycerides: 53 mg/dL (ref 0.0–149.0)
VLDL: 10.6 mg/dL (ref 0.0–40.0)

## 2013-12-29 LAB — TSH: TSH: 1.3 u[IU]/mL (ref 0.35–5.50)

## 2013-12-29 MED ORDER — TRAZODONE HCL 50 MG PO TABS: 25.0000 mg | ORAL_TABLET | Freq: Every evening | ORAL | Status: DC | PRN

## 2013-12-29 NOTE — Patient Instructions (Signed)
Get mammogram . prevnar 13  Update immunizations . When you travel.  Can try vesicare when travel for urinary issues but can cause dry mouth etc. monitor swelling are  Could be a bakers cyst .   Recheck if  persistent or progressive  Would avoid ambien at this time except with time zone travel.  Try melatonin 3-4 hours pre sleep to help with sleep. caution with the tylenol pm.  Could also try trazadone 25 - 50 mg at night  Instead i you wish . Will notify you  of labs when available.

## 2013-12-29 NOTE — Assessment & Plan Note (Signed)
Advise against Ambien except under certain circumstance and even tylenol pm a risk  rx for trazodone and disc melatonin

## 2013-12-29 NOTE — Progress Notes (Signed)
Chief Complaint  Patient presents with  . Medicare Wellness    anxiety  . Insomnia  . Hypertension  . Hyperlipidemia    HPI: Patient comes in today for Preventive Medicare wellness visit . No major injuries, ed visits ,hospitalizations , new medications since last visit.  Had colonoscopy  Had post diarrhea and then constipation  Peaches work best. 1. Wobbly.  Feeling  Over trhe past months when nervous and gets better whentakes.  Valium  !1/2 helps it.   Cant sleep and 1/2 tylenol pm.  ? Other options   Saw dr fields  Had x ray mri  From 4 years ago and given exercises but didn't understand them so didn't do them   HT: no prob with meds  Seems to be at goal not checking much  LIPIDS no se  To travel to [place] next year to [place] this year asks about short term bladder med for travel   Has tender swelling behind right knee no other injury or leg swelling  Onset about a month ago no limitations  Health Maintenance  Topic Date Due  . Tetanus/tdap  03/10/1959  . Mammogram  03/09/1990  . Zostavax  03/09/2000  . Pneumococcal Polysaccharide Vaccine Age 60 And Over  03/09/2005  . Influenza Vaccine  06/18/2013  . Colonoscopy  07/07/2018   Health Maintenance Review  Hearing:  No change  Gradual loss   Vision:   Last eye check UTD glassess  harcant read with one eye  Having a time with this  Avoids driving at night   Safety:  Has smoke detector and wears seat belts.  No firearms. No excess sun exposure. Sees dentist regularly.  Falls: No  Advance directive :  Reviewed    Memory: Felt to be good  , word finding . no concern from her or her family.  Depression: No anhedonia unusual crying or depressive symptoms  Nutrition: Eats well balanced diet; adequate calcium and vitamin D. No swallowing chewing problems.  Injury: no major injuries in the last six months.  Other healthcare providers:  Reviewed today .  Social:  Lives with 2 . No pets.   Preventive parameters:  up-to-date  Reviewed   ADLS:   There are no problems or need for assistance  driving, feeding, obtaining food, dressing, toileting and bathing, managing money using phone. She is independent.  EXERCISE/ HABITS  Per week none No tobacco , wine 2 glasses 1 per week .   etoh   ROS:  GEN/ HEENT: No fever, significant weight changes sweats headaches CV/ PULM; No chest pain shortness of breath cough, syncope,edema  change in exercise tolerance. GI /GU: No adominal pain, vomiting, change in bowel habits. No blood in the stool. No significant GU symptoms. SKIN/HEME: ,no acute skin rashes suspicious lesions or bleeding. No lymphadenopathy, nodules, masses.  NEURO/ PSYCH:  No neurologic signs such as weakness numbness. No depression anxiety as above  IMM/ Allergy: No unusual infections.  Allergy .   REST of 12 system review negative except as per HPI   Past Medical History  Diagnosis Date  . HYPERPARATHYROIDISM UNSPECIFIED 10/20/2007  . HYPERLIPIDEMIA 07/07/2007  . ANEMIA-NOS 07/07/2007  . HYPERTENSION 02/04/2008  . DIVERTICULOSIS, COLON 02/23/2009  . UTI'S, RECURRENT 11/20/2009    kimbrough   . OSTEOPOROSIS 01/01/2008  . SCOLIOSIS 05/25/2009  . FATIGUE 05/25/2009  . PALPITATIONS, OCCASIONAL 07/12/2008  . URINARY INCONTINENCE 07/07/2007  . ADVERSE REACTION TO MEDICATION 07/31/2009  . COLONIC POLYPS, ADENOMATOUS, HX OF   .  NEPHROLITHIASIS, HX OF 11/15/2008  . PARATHYROIDECTOMY 01/02/2009  . Seasonal allergies   . Unspecified constipation   . Other acquired absence of organ     parathyroidectomy  . Fever, unspecified   . Abdominal pain, unspecified site   . Disturbance of skin sensation   . Pain in limb   . Chest pain, unspecified   . Family history of diabetes mellitus   . Family history of malignant neoplasm of breast     Family History  Problem Relation Age of Onset  . Breast cancer Other     1st degree relative <50  . Diabetes      1st degree relative  . Sudden death Father 63  . Heart  disease Mother     valve replacement  . Colon cancer Neg Hx   . Stomach cancer Neg Hx     History   Social History  . Marital Status: Married    Spouse Name: N/A    Number of Children: 24  . Years of Education: N/A   Occupational History  . travel agent    Social History Main Topics  . Smoking status: Never Smoker   . Smokeless tobacco: Never Used  . Alcohol Use: 3.0 oz/week    5 Glasses of wine per week     Comment: occ. wine  . Drug Use: No  . Sexual Activity: None   Other Topics Concern  . None   Social History Narrative   HH of 5    Kids at home now   Married   travel agent.    2-3 c coffee in am    ocass wine    G3P2vaginal delivery   Pt cell [phone]             Outpatient Encounter Prescriptions as of 12/29/2013  Medication Sig  . amLODipine (NORVASC) 2.5 MG tablet TAKE 1 TABLET ONCE DAILY.  [name] aspirin 81 MG tablet Take 81 mg by mouth daily.    [name] atorvastatin (LIPITOR) 20 MG tablet TAKE 1 TABLET EACH DAY.  . cholecalciferol (VITAMIN D) 1000 UNITS tablet Take 1,000 Units by mouth daily.  . diazepam (VALIUM) 2 MG tablet TAKE 1/2 TO 1 TABLET 2 TIMES A DAY.  [name] diclofenac (VOLTAREN) 75 MG EC tablet Take 75 mg by mouth 2 (two) times daily as needed.  [name] losartan (COZAAR) 100 MG tablet Take 50 mg by mouth daily.  . traZODone (DESYREL) 50 MG tablet Take 0.5-1 tablets (25-50 mg total) by mouth at bedtime as needed for sleep.    EXAM:  BP 140/90  Pulse 58  Temp(Src) 98.5 F (36.9 C) (Oral)  Ht 5' 1.25" (1.556 m)  Wt 124 lb (56.246 kg)  BMI 23.23 kg/m2  SpO2 98%  Body mass index is 23.23 kg/(m^2).  Physical Exam: Vital signs reviewed KYH:CWCB is a well-developed well-nourished alert cooperative   who appears stated age in no acute distress.  HEENT: normocephalic atraumatic , Eyes: PERRL EOM's full, conjunctiva clear,glasses  Nares: paten,t no deformity discharge or tenderness., Ears: no deformity EAC's clear TMs with normal landmarks. Mouth: clear OP, no  lesions, edema.  Moist mucous membranes. Dentition in adequate repair. NECK: supple without masses, thyromegaly or bruits. CHEST/PULM:  Clear to auscultation and percussion breath sounds equal no wheeze , rales or rhonchi. No chest wall deformities or tenderness. CV: PMI is nondisplaced, S1 S2 no gallops, murmurs, rubs. Peripheral pulses are full without delay.No JVD .  Breast: normal by inspection . No dimpling,  discharge, masses, tenderness or discharge . ABDOMEN: Bowel sounds normal nontender  No guard or rebound, no hepato splenomegal no CVA tenderness.  No hernia. Extremtities:  No clubbing cyanosis or edema, no acute joint swelling or redness no focal atrophy some swelling post right knee soft no redness knee rom ok  NEURO:  Oriented x3, cranial nerves 3-12 appear to be intact, no obvious focal weakness,gait within normal limits SKIN: No acute rashes normal turgor, color, no bruising or petechiae. PSYCH: Oriented, good eye contact, no obvious depression anxiety, cognition and judgment appear normal. LN: no cervical axillary inguinal adenopathy No noted deficits in memory, attention, and speech.   Lab Results  Component Value Date   WBC 5.3 12/29/2013   HGB 14.5 12/29/2013   HCT 44.7 12/29/2013   PLT 223.0 12/29/2013   GLUCOSE 95 12/29/2013   CHOL 148 12/29/2013   TRIG 53.0 12/29/2013   HDL 68.20 12/29/2013   LDLDIRECT 156.3 03/28/2011   LDLCALC 69 12/29/2013   ALT 19 12/29/2013   AST 23 12/29/2013   NA 145 12/29/2013   K 5.0 12/29/2013   CL 107 12/29/2013   CREATININE 0.7 12/29/2013   BUN 15 12/29/2013   CO2 26 12/29/2013   TSH 1.30 12/29/2013   INR 0.9 09/23/2007    ASSESSMENT AND PLAN:  Discussed the following assessment and plan:  Medicare annual wellness visit, initial - decline zostavax and td  can get pre travel   Unspecified essential hypertension - stable enough  - Plan: Lipid panel, Basic metabolic panel, TSH, Hepatic function panel, CBC with Differential  Other and  unspecified hyperlipidemia - Plan: Lipid panel, Basic metabolic panel, TSH, Hepatic function panel, CBC with Differential  Vertigo - Plan: Lipid panel, Basic metabolic panel, TSH, Hepatic function panel, CBC with Differential  Need for vaccination with 13-polyvalent pneumococcal conjugate vaccine - Plan: Pneumococcal conjugate vaccine 13-valent, Lipid panel, Basic metabolic panel, TSH, Hepatic function panel, CBC with Differential  Sleep disturbance  Swelling - post r knee poss bakers cyst  Risk benefit of medications discussed.  Patient Care Team: Burnis Medin, MD as PCP - General Larey Dresser, MD (Cardiology) Amy Y Martinique, MD (Dermatology) [facility] Grant Fontana., MD (Urology)  Patient Instructions  Get mammogram . prevnar 13  Update immunizations . When you travel.  Can try vesicare when travel for urinary issues but can cause dry mouth etc. monitor swelling are  Could be a bakers cyst .   Recheck if  persistent or progressive  Would avoid ambien at this time except with time zone travel.  Try melatonin 3-4 hours pre sleep to help with sleep. caution with the tylenol pm.  Could also try trazadone 25 - 50 mg at night  Instead i you wish . Will notify you  of labs when available.    Standley Brooking. Panosh M.D. Pre visit review using our clinic review tool, if applicable. No additional management support is needed unless otherwise documented below in the visit note. Total visit 53mins > 50% spent counseling and coordinating care related to problem  diagnis s

## 2013-12-30 ENCOUNTER — Telehealth: Payer: Self-pay | Admitting: Internal Medicine

## 2013-12-30 NOTE — Telephone Encounter (Signed)
Relevant patient education assigned to patient using Emmi. ° °

## 2014-01-05 ENCOUNTER — Other Ambulatory Visit: Payer: Self-pay | Admitting: Internal Medicine

## 2014-03-24 ENCOUNTER — Encounter: Payer: Self-pay | Admitting: Cardiology

## 2014-03-24 ENCOUNTER — Ambulatory Visit (INDEPENDENT_AMBULATORY_CARE_PROVIDER_SITE_OTHER): Payer: Medicare Other | Admitting: Cardiology

## 2014-03-24 VITALS — BP 127/66 | HR 64 | Ht 61.0 in | Wt 123.0 lb

## 2014-03-24 DIAGNOSIS — I10 Essential (primary) hypertension: Secondary | ICD-10-CM

## 2014-03-24 DIAGNOSIS — E785 Hyperlipidemia, unspecified: Secondary | ICD-10-CM

## 2014-03-24 NOTE — Patient Instructions (Signed)
Wear  compression stockings during the day and take them off at night.   Your physician recommends that you schedule a follow-up appointment in: 3 months with Dr Aundra Dubin.

## 2014-03-25 NOTE — Progress Notes (Signed)
Patient ID: [name], female   DOB: [birth year], 73 y.o.   MRN: [id number] PCP: Dr. Regis Bill   73 yo with history of HTN and hyperlipidemia returns for followup of labile blood pressure.  Her SBP varies from around 100 to 160.  She feels best when her BP is higher.  She gets orthostatic-type symptoms (lightheadedness with standing) primarily in the mid to late afternoon.  She actually takes her BP-active medications in the evening.  No chest pain.  No exertional dyspnea.   Labs (10/11): K 4.5, creatinine 0.8, HDL 62, LDL 157, TSH normal  Labs (5/12): K 4.4, creatinine 0.7, LDL 156, HDL 66 Labs (6/12): K 4.9, creatinine 0.8, BNP 67 Labs (7/12): LDL 69, HDL 54, LFTs normal Labs (10/13): LDL 86, HDL 59 Labs (11/13): K 4.7, creatinine 0.6 Labs (2/15): K 5, creatinine 0.7, LDL 69, HDL 68  ECG: NSR, normal  PMH:  1. HTN: x 2 years at least. Cough with ACEI. She has had a history of orthostatic symptoms. Echo (5/12): EF 60-65%, mild focal basal septal hypertrophy, grade II diastolic dysfunction, mild MR.  2. Colonic polyps  3. IBS.  4. H/o hyperparathyroidism s/p parathyroidectomy  5. Osteoporosis  6. Hyperlipidemia  7. IBS  8. History of recurrent UTIs   SH: Nonsmoker. Married, lives in [place]. Son lives with her. Works as travel Music therapist.   FH: Father with sudden cardiac death at age 51. Mother with rheumatic heart disease, had valve replacements. Sister is healthy.   ROS: All systems reviewed and negative except as per HPI.   Current Outpatient Prescriptions  Medication Sig Dispense Refill  . amLODipine (NORVASC) 2.5 MG tablet TAKE 1 TABLET ONCE DAILY.  30 tablet  11  . aspirin 81 MG tablet Take 81 mg by mouth daily.        [name] atorvastatin (LIPITOR) 20 MG tablet TAKE 1 TABLET EACH DAY.  90 tablet  1  . cholecalciferol (VITAMIN D) 1000 UNITS tablet Take 1,000 Units by mouth daily.      . diazepam (VALIUM) 2 MG tablet Take 1-2 mg by mouth daily as needed.      . diclofenac (VOLTAREN)  75 MG EC tablet Take 75 mg by mouth as needed.       . diphenhydramine-acetaminophen (TYLENOL PM) 25-500 MG TABS Take 0.5 tablets by mouth at bedtime as needed.      . loratadine (CLARITIN) 10 MG tablet Take 10 mg by mouth daily.      [name] losartan (COZAAR) 100 MG tablet Take one -half tablet daily       No current facility-administered medications for this visit.    BP 127/66  Pulse 64  Ht 5\' 1"  (1.549 m)  Wt 55.792 kg (123 lb)  BMI 23.25 kg/m2 General: NAD Neck: No JVD, no thyromegaly or thyroid nodule.  Lungs: Clear to auscultation bilaterally with normal respiratory effort. CV: Nondisplaced PMI.  Heart regular S1/S2, no S3, +S4, no murmur.  No peripheral edema.  No carotid bruit.  Normal pedal pulses.  Abdomen: Soft, nontender, no hepatosplenomegaly, no distention.  Neurologic: Alert and oriented x 3.  Psych: Normal affect. Extremities: No clubbing or cyanosis.   Assessment/Plan:  HYPERTENSION  Labile BP.  Runs high at times (up to 660 systolic) but also low during the afternoon.  She already takes her BP meds in the evening.  I would not cut back on her meds as her BP is high at times.   I will have her try wearing  compression stockings to see if these help with her orthostatic symptoms.     HYPERLIPIDEMIA Lipids excellent on atorvastatin.   Larey Dresser 03/25/2014

## 2014-03-31 ENCOUNTER — Ambulatory Visit (INDEPENDENT_AMBULATORY_CARE_PROVIDER_SITE_OTHER): Payer: Medicare Other | Admitting: Internal Medicine

## 2014-03-31 ENCOUNTER — Encounter: Payer: Self-pay | Admitting: Internal Medicine

## 2014-03-31 VITALS — BP 144/84 | HR 55 | Temp 98.1°F | Resp 18 | Ht 62.0 in | Wt 124.0 lb

## 2014-03-31 DIAGNOSIS — Z23 Encounter for immunization: Secondary | ICD-10-CM | POA: Diagnosis not present

## 2014-03-31 DIAGNOSIS — E785 Hyperlipidemia, unspecified: Secondary | ICD-10-CM

## 2014-03-31 DIAGNOSIS — R32 Unspecified urinary incontinence: Secondary | ICD-10-CM

## 2014-03-31 DIAGNOSIS — I10 Essential (primary) hypertension: Secondary | ICD-10-CM | POA: Diagnosis not present

## 2014-03-31 DIAGNOSIS — M810 Age-related osteoporosis without current pathological fracture: Secondary | ICD-10-CM | POA: Diagnosis not present

## 2014-03-31 DIAGNOSIS — R42 Dizziness and giddiness: Secondary | ICD-10-CM

## 2014-03-31 NOTE — Progress Notes (Signed)
Subjective:    Patient ID: [name], female    DOB: [birth year], 73 y.o.   MRN: [id number]  HPI ,  [name]  Is here as a new pt first vsiit for primary care.    PMH anxiety (uses rare Valium), HTN,  Hyperlipidemia,   Hyperparathyroidis (S/P parathyroidectomy),  Diastolic dysfunction, renal calculi, Diverticulosis,   Allergic rhinitis,  Osteoporosis  (on Fosamax for "years'  Off meds now), Colon polyps, and  mixed incotinence  (recently started Vesicare    She also reports chronic but infrequent lumber back pain.    She had a parathyrodectomy for hypercalcemia  approx 3 years ago  [name] is concerened about her recent blood pressure which she believes is "high at times and then will run low"  She describes low as a SBP around 110-100 and SBP will be in the 160's when it is high.    She has noticed a "wobbly, can't get my balance" feeling that has been going on for several months now.  She has seen both her primary MD and her cardiologist for this concern.  She has been attributing her "dizzy, wobbly" feeling to vertigo.    She does tell me that in the past she had seen a neurologist Dr. Earley Favor who told her she had some sort of "growth" on her brain that would "never bother her ".    Pt denies visual , speech changes ,  Denies paresthesias, denies acute loss of muscle use.  She deneis LOC or feeling of passing out.  When BP is 100-110 she feels, nauseous, tired and very nervous.    She does alternate constipation and diarrhea and she tells me peaches will help with her constipation.   She denies wt loss or fever  Current BP meds  Cozaar 100 mg and low dose Norvasc 2.5 mg    Allergies  Allergen Reactions  . Lisinopril     REACTION: cough   Past Medical History  Diagnosis Date  . HYPERPARATHYROIDISM UNSPECIFIED 10/20/2007  . HYPERLIPIDEMIA 07/07/2007  . ANEMIA-NOS 07/07/2007  . HYPERTENSION 02/04/2008  . DIVERTICULOSIS, COLON 02/23/2009  . UTI'S, RECURRENT 11/20/2009    [name]   .  OSTEOPOROSIS 01/01/2008  . SCOLIOSIS 05/25/2009  . FATIGUE 05/25/2009  . PALPITATIONS, OCCASIONAL 07/12/2008  . URINARY INCONTINENCE 07/07/2007  . ADVERSE REACTION TO MEDICATION 07/31/2009  . COLONIC POLYPS, ADENOMATOUS, HX OF   . NEPHROLITHIASIS, HX OF 11/15/2008  . PARATHYROIDECTOMY 01/02/2009  . Seasonal allergies   . Unspecified constipation   . Other acquired absence of organ     parathyroidectomy  . Fever, unspecified   . Abdominal pain, unspecified site   . Disturbance of skin sensation   . Pain in limb   . Chest pain, unspecified   . Family history of diabetes mellitus   . Family history of malignant neoplasm of breast    Past Surgical History  Procedure Laterality Date  . Right oophorectomy    . Parathyroidectomy      Rt Superior open neck exploration   History   Social History  . Marital Status: Married    Spouse Name: N/A    Number of Children: 58  . Years of Education: N/A   Occupational History  . travel agent    Social History Main Topics  . Smoking status: Never Smoker   . Smokeless tobacco: Never Used  . Alcohol Use: 3.0 oz/week    5 Glasses of wine per week     Comment: occ.  wine  . Drug Use: No  . Sexual Activity: No   Other Topics Concern  . Not on file   Social History Narrative   HH of 5    Kids at home now   Married   travel agent.    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 Breast cancer Other     1st degree relative <50  . Diabetes      1st degree relative  . Sudden death Father 73  . Heart disease Father 63    MI  . Heart disease Mother     valve replacement  . Colon cancer Neg Hx   . Stomach cancer Neg Hx    Patient Active Problem List   Diagnosis Date Noted  . Vertigo 12/26/2012  . Hypertension, uncontrolled 09/11/2012  . Diastolic dysfunction 63/11/6008  . Sleep disturbance 09/17/2011  . Hypotension 12/26/2010  .  Hypotension, postural 12/26/2010  . UTI'S, RECURRENT 11/20/2009  . ADVERSE REACTION TO MEDICATION 07/31/2009  . SCOLIOSIS 05/25/2009  . FATIGUE 05/25/2009  . DIVERTICULOSIS, COLON 02/23/2009  . COLONIC POLYPS, ADENOMATOUS, HX OF 02/23/2009  . PARATHYROIDECTOMY 01/02/2009  . NEPHROLITHIASIS, HX OF 11/15/2008  . PALPITATIONS, OCCASIONAL 07/12/2008  . HYPERTENSION 02/04/2008  . CONSTIPATION, CHRONIC 01/01/2008  . LEG PAIN, LEFT 01/01/2008  . OSTEOPOROSIS 01/01/2008  . HYPERPARATHYROIDISM UNSPECIFIED 10/20/2007  . HYPERLIPIDEMIA 07/07/2007  . ANEMIA-NOS 07/07/2007  . URINARY INCONTINENCE 07/07/2007   Current Outpatient Prescriptions on File Prior to Visit  Medication Sig Dispense Refill  . amLODipine (NORVASC) 2.5 MG tablet TAKE 1 TABLET ONCE DAILY.  30 tablet  11  . aspirin 81 MG tablet Take 81 mg by mouth daily.        [name] atorvastatin (LIPITOR) 20 MG tablet TAKE 1 TABLET EACH DAY.  90 tablet  1  . cholecalciferol (VITAMIN D) 1000 UNITS tablet Take 1,000 Units by mouth daily.      . diphenhydramine-acetaminophen (TYLENOL PM) 25-500 MG TABS Take 0.5 tablets by mouth at bedtime as needed.      . diazepam (VALIUM) 2 MG tablet Take 1-2 mg by mouth daily as needed.      . diclofenac (VOLTAREN) 75 MG EC tablet Take 75 mg by mouth as needed.       . loratadine (CLARITIN) 10 MG tablet Take 10 mg by mouth daily.      [name] losartan (COZAAR) 100 MG tablet Take one -half tablet daily       No current facility-administered medications on file prior to visit.      Review of Systems See HPI    Objective:   Physical Exam Physical Exam  Nursing note and vitals reviewed.  Constitutional: She is oriented to person, place, and time. She appears well-developed and well-nourished.  HENT:  Head: Normocephalic and atraumatic.  Cardiovascular: Normal rate and regular rhythm. Exam reveals no gallop and no friction rub.  No murmur heard.  Pulmonary/Chest: Breath sounds normal. She has no wheezes. She  has no rales.  Neurological: She is alert and oriented to person, place, and time.  Skin: Skin is warm and dry.  Psychiatric: She has a normal mood and affect. Her behavior is normal.        Assessment & Plan:  HTn   Will not change any meds  Dizziness:  Unclear if this is due to medication,  Centrally neurologically  mediated or peripheral,  Or a cardiology etiology  at this point.  EKG doen 5/7 NSR nothing acute.   Will need to review prior visits and will bring pt back for full neurologic eval and possible imaging.  She has had recent labs and they look good  Hyperlipidemia  Continue meds  Anxiety  Low dose rare Valium  Hyperparathyroidism  Mixed urinary incontinience  Divericulosis/colon polyps   Renal calculi   Allergic rhiitis  Chronic lumbar discomfort.    See me in 2-3 weeks

## 2014-04-04 ENCOUNTER — Telehealth: Payer: Self-pay | Admitting: *Deleted

## 2014-04-04 ENCOUNTER — Encounter: Payer: Self-pay | Admitting: Internal Medicine

## 2014-04-04 ENCOUNTER — Other Ambulatory Visit: Payer: Self-pay | Admitting: *Deleted

## 2014-04-04 NOTE — Patient Instructions (Signed)
See me in 2-3 weeks

## 2014-04-04 NOTE — Telephone Encounter (Signed)
error 

## 2014-04-04 NOTE — Telephone Encounter (Signed)
Needs refill of solifenacin (VESICARE) 5 MG tablet

## 2014-04-14 ENCOUNTER — Telehealth: Payer: Self-pay | Admitting: Cardiology

## 2014-04-14 NOTE — Telephone Encounter (Signed)
New message     Pt is on bp medications.  Her bp is getting low-----119/60 sitting and 83/55 standing.  Pt feels lightheaded. Should she continue taking rx?  OK to leave msg on vm

## 2014-04-14 NOTE — Telephone Encounter (Signed)
Start with stopping amlodipine and call us back with BP readings off amlodipine.

## 2014-04-14 NOTE — Telephone Encounter (Signed)
Pt states she has had some lightheadedness in the last few months. She was particularly lightheaded today and her BP at that time was 119/60 sitting and 83/55 standing. She is feeling better now but is asking if her medication should be changed. She is taking amlodipine 2.5mg  daily and losartan 50mg  daily.

## 2014-04-14 NOTE — Telephone Encounter (Signed)
Pt advised,verbalized understanding. 

## 2014-04-14 NOTE — Telephone Encounter (Signed)
Follow up ° ° ° ° °Returned a nurses call °

## 2014-04-24 DIAGNOSIS — M25519 Pain in unspecified shoulder: Secondary | ICD-10-CM | POA: Diagnosis not present

## 2014-04-24 DIAGNOSIS — M542 Cervicalgia: Secondary | ICD-10-CM | POA: Diagnosis not present

## 2014-04-25 ENCOUNTER — Encounter: Payer: Self-pay | Admitting: Internal Medicine

## 2014-04-25 ENCOUNTER — Ambulatory Visit (INDEPENDENT_AMBULATORY_CARE_PROVIDER_SITE_OTHER): Payer: Medicare Other | Admitting: Internal Medicine

## 2014-04-25 VITALS — BP 151/74 | HR 57 | Temp 98.1°F | Resp 16 | Ht 62.0 in | Wt 122.0 lb

## 2014-04-25 DIAGNOSIS — D496 Neoplasm of unspecified behavior of brain: Secondary | ICD-10-CM | POA: Diagnosis not present

## 2014-04-25 DIAGNOSIS — R42 Dizziness and giddiness: Secondary | ICD-10-CM | POA: Insufficient documentation

## 2014-04-25 DIAGNOSIS — R270 Ataxia, unspecified: Secondary | ICD-10-CM

## 2014-04-25 DIAGNOSIS — R279 Unspecified lack of coordination: Secondary | ICD-10-CM | POA: Diagnosis not present

## 2014-04-25 NOTE — Progress Notes (Signed)
Subjective:    Patient ID: [name], female    DOB: 1941/10/22, 73 y.o.   MRN: [id number]  HPI [name] is here for follow up of dizziness and labile BP.[name]   She tells me her cardiologist stopped her amlodipine and she is feeling better   She woke up yesterday with severe pain in her upper left shoulder.  She was seen in Dr. Archie Endo office and is now on steroids and Tramadol.  He plans to do an MRI if not better  Regarding dizziness.  Started 2 months ago and is positional in nature especially when lying down  (not when getting up) and when turning head   She did tell me that when she was travelling in [place] she had a CT head done which showed a "brain tumor"  She was evaluated by Dr. Earley Favor here.   I do not have those records.   No visual change, no dysartrhia no numbness no motor weakness  No loss of hearing and no N/V  VS  162/83 P59 lying,    144/78  Pulse 59 standing    Allergies  Allergen Reactions  . Lisinopril     REACTION: cough   Past Medical History  Diagnosis Date  . HYPERPARATHYROIDISM UNSPECIFIED 10/20/2007  . HYPERLIPIDEMIA 07/07/2007  . ANEMIA-NOS 07/07/2007  . HYPERTENSION 02/04/2008  . DIVERTICULOSIS, COLON 02/23/2009  . UTI'S, RECURRENT 11/20/2009    kimbrough   . OSTEOPOROSIS 01/01/2008  . SCOLIOSIS 05/25/2009  . FATIGUE 05/25/2009  . PALPITATIONS, OCCASIONAL 07/12/2008  . URINARY INCONTINENCE 07/07/2007  . ADVERSE REACTION TO MEDICATION 07/31/2009  . COLONIC POLYPS, ADENOMATOUS, HX OF   . NEPHROLITHIASIS, HX OF 11/15/2008  . PARATHYROIDECTOMY 01/02/2009  . Seasonal allergies   . Unspecified constipation   . Other acquired absence of organ     parathyroidectomy  . Fever, unspecified   . Abdominal pain, unspecified site   . Disturbance of skin sensation   . Pain in limb   . Chest pain, unspecified   . Family history of diabetes mellitus   . Family history of malignant neoplasm of breast    Past Surgical History  Procedure Laterality Date  . Right  oophorectomy    . Parathyroidectomy      Rt Superior open neck exploration   History   Social History  . Marital Status: Married    Spouse Name: N/A    Number of Children: 51  . Years of Education: N/A   Occupational History  . travel agent    Social History Main Topics  . Smoking status: Never Smoker   . Smokeless tobacco: Never Used  . Alcohol Use: 3.0 oz/week    5 Glasses of wine per week     Comment: occ. wine  . Drug Use: No  . Sexual Activity: No   Other Topics Concern  . Not on file   Social History Narrative   HH of 5    Kids at home now   Married   travel agent.    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 Breast cancer Other     1st degree relative <50  . Diabetes      1st degree relative  . Sudden death Father 47  . Heart disease Father 3    MI  .  Heart disease Mother     valve replacement  . Colon cancer Neg Hx   . Stomach cancer Neg Hx    Patient Active Problem List   Diagnosis Date Noted  . Vertigo 12/26/2012  . Hypertension, uncontrolled 09/11/2012  . Diastolic dysfunction 80/32/1224  . Sleep disturbance 09/17/2011  . Hypotension 12/26/2010  . Hypotension, postural 12/26/2010  . UTI'S, RECURRENT 11/20/2009  . ADVERSE REACTION TO MEDICATION 07/31/2009  . SCOLIOSIS 05/25/2009  . FATIGUE 05/25/2009  . DIVERTICULOSIS, COLON 02/23/2009  . COLONIC POLYPS, ADENOMATOUS, HX OF 02/23/2009  . PARATHYROIDECTOMY 01/02/2009  . NEPHROLITHIASIS, HX OF 11/15/2008  . PALPITATIONS, OCCASIONAL 07/12/2008  . HYPERTENSION 02/04/2008  . CONSTIPATION, CHRONIC 01/01/2008  . LEG PAIN, LEFT 01/01/2008  . OSTEOPOROSIS 01/01/2008  . HYPERPARATHYROIDISM UNSPECIFIED 10/20/2007  . HYPERLIPIDEMIA 07/07/2007  . ANEMIA-NOS 07/07/2007  . URINARY INCONTINENCE 07/07/2007   Current Outpatient Prescriptions on File Prior to Visit  Medication Sig Dispense Refill  .  aspirin 81 MG tablet Take 81 mg by mouth daily.        [name] atorvastatin (LIPITOR) 20 MG tablet TAKE 1 TABLET EACH DAY.  90 tablet  1  . cholecalciferol (VITAMIN D) 1000 UNITS tablet Take 1,000 Units by mouth daily.      . diazepam (VALIUM) 2 MG tablet Take 1-2 mg by mouth daily as needed.      . diclofenac (VOLTAREN) 75 MG EC tablet Take 75 mg by mouth as needed.       . diphenhydramine-acetaminophen (TYLENOL PM) 25-500 MG TABS Take 0.5 tablets by mouth at bedtime as needed.      . loratadine (CLARITIN) 10 MG tablet Take 10 mg by mouth daily.      [name] losartan (COZAAR) 100 MG tablet Take one -half tablet daily      . solifenacin (VESICARE) 5 MG tablet Take 5 mg by mouth daily.       No current facility-administered medications on file prior to visit.       Review of Systems See HPI    Objective:   Physical Exam  Physical Exam  Nursing note and vitals reviewed.   Dizziness reproduced when pt went from sitting to lying down on table Constitutional: She is oriented to person, place, and time. She appears well-developed and well-nourished.  HENT:  Head: Normocephalic and atraumatic.  Cardiovascular: Normal rate and regular rhythm. Exam reveals no gallop and no friction rub.  No murmur heard.  Pulmonary/Chest: Breath sounds normal. She has no wheezes. She has no rales.  Neurological: She is alert and oriented to person, place, and time.  CNII-xii intact no nystagmus Motor no drift  5/5 Ue and le Reflexes  2+ symmetric Cerebellar  Intact FTN Sensory intact to microfilament Romberg neg Skin: Skin is warm and dry.  Psychiatric: She has a normal mood and affect. Her behavior is normal.            Assessment & Plan:  Dizziness cliniclly seems to be most consistant with BPV   With history of brain tumor will get CT and will also send to vestibular rehab    Labile BP   Off amlodipine per cardilogy recommendation  Shoulder pain  Undergoing eval by ortho  Further management based  on CT resulst

## 2014-04-25 NOTE — Patient Instructions (Addendum)
See me in 4 weeks 30 mins  Will set up Ct brain with  And without

## 2014-04-26 ENCOUNTER — Encounter: Payer: Self-pay | Admitting: Internal Medicine

## 2014-04-26 LAB — COMPREHENSIVE METABOLIC PANEL
ALT: 13 U/L (ref 0–35)
AST: 19 U/L (ref 0–37)
Albumin: 4.4 g/dL (ref 3.5–5.2)
Alkaline Phosphatase: 71 U/L (ref 39–117)
BUN: 13 mg/dL (ref 6–23)
CO2: 22 mEq/L (ref 19–32)
Calcium: 9.1 mg/dL (ref 8.4–10.5)
Chloride: 104 mEq/L (ref 96–112)
Creat: 0.6 mg/dL (ref 0.50–1.10)
Glucose, Bld: 84 mg/dL (ref 70–99)
Potassium: 3.9 mEq/L (ref 3.5–5.3)
Sodium: 140 mEq/L (ref 135–145)
Total Bilirubin: 1.1 mg/dL (ref 0.2–1.2)
Total Protein: 6.8 g/dL (ref 6.0–8.3)

## 2014-05-02 ENCOUNTER — Ambulatory Visit: Payer: Medicare Other | Attending: Internal Medicine | Admitting: Physical Therapy

## 2014-05-02 DIAGNOSIS — IMO0001 Reserved for inherently not codable concepts without codable children: Secondary | ICD-10-CM | POA: Insufficient documentation

## 2014-05-02 DIAGNOSIS — H8110 Benign paroxysmal vertigo, unspecified ear: Secondary | ICD-10-CM | POA: Insufficient documentation

## 2014-05-02 DIAGNOSIS — R42 Dizziness and giddiness: Secondary | ICD-10-CM | POA: Insufficient documentation

## 2014-05-04 ENCOUNTER — Ambulatory Visit (HOSPITAL_COMMUNITY): Payer: Medicare Other

## 2014-05-04 ENCOUNTER — Encounter (HOSPITAL_COMMUNITY): Payer: Self-pay

## 2014-05-04 ENCOUNTER — Ambulatory Visit (HOSPITAL_COMMUNITY)
Admission: RE | Admit: 2014-05-04 | Discharge: 2014-05-04 | Disposition: A | Discharge status: Home / Self Care | Payer: Medicare Other | Source: Ambulatory Visit | Attending: Internal Medicine | Admitting: Internal Medicine

## 2014-05-04 ENCOUNTER — Other Ambulatory Visit: Payer: Self-pay | Admitting: Internal Medicine

## 2014-05-04 DIAGNOSIS — D496 Neoplasm of unspecified behavior of brain: Secondary | ICD-10-CM

## 2014-05-04 DIAGNOSIS — R270 Ataxia, unspecified: Secondary | ICD-10-CM

## 2014-05-04 DIAGNOSIS — R42 Dizziness and giddiness: Secondary | ICD-10-CM | POA: Insufficient documentation

## 2014-05-04 MED ORDER — IOHEXOL 300 MG/ML  SOLN
100.0000 mL | Freq: Once | INTRAMUSCULAR | Status: AC | PRN
  Administered 2014-05-04: 100 mL via INTRAVENOUS

## 2014-05-05 ENCOUNTER — Telehealth: Payer: Self-pay | Admitting: Internal Medicine

## 2014-05-05 ENCOUNTER — Encounter: Payer: Self-pay | Admitting: Internal Medicine

## 2014-05-05 ENCOUNTER — Ambulatory Visit: Payer: Medicare Other | Admitting: Physical Therapy

## 2014-05-05 DIAGNOSIS — R42 Dizziness and giddiness: Secondary | ICD-10-CM | POA: Diagnosis not present

## 2014-05-05 DIAGNOSIS — H8110 Benign paroxysmal vertigo, unspecified ear: Secondary | ICD-10-CM | POA: Diagnosis not present

## 2014-05-05 DIAGNOSIS — IMO0001 Reserved for inherently not codable concepts without codable children: Secondary | ICD-10-CM | POA: Diagnosis not present

## 2014-05-05 NOTE — Telephone Encounter (Signed)
Spoke with pt and informed of CT results  She states dizziness is gone  After Epley

## 2014-05-05 NOTE — Telephone Encounter (Signed)
Left message on mobile and home to call office regarding CT results

## 2014-05-09 ENCOUNTER — Ambulatory Visit: Payer: Medicare Other | Admitting: Physical Therapy

## 2014-05-10 ENCOUNTER — Ambulatory Visit: Payer: Medicare Other | Admitting: Physical Therapy

## 2014-05-10 DIAGNOSIS — IMO0001 Reserved for inherently not codable concepts without codable children: Secondary | ICD-10-CM | POA: Diagnosis not present

## 2014-05-10 DIAGNOSIS — H8110 Benign paroxysmal vertigo, unspecified ear: Secondary | ICD-10-CM | POA: Diagnosis not present

## 2014-05-10 DIAGNOSIS — R42 Dizziness and giddiness: Secondary | ICD-10-CM | POA: Diagnosis not present

## 2014-05-11 ENCOUNTER — Other Ambulatory Visit: Payer: Self-pay | Admitting: *Deleted

## 2014-05-11 DIAGNOSIS — R32 Unspecified urinary incontinence: Secondary | ICD-10-CM

## 2014-05-11 MED ORDER — SOLIFENACIN SUCCINATE 5 MG PO TABS: 5.0000 mg | ORAL_TABLET | Freq: Every day | ORAL | Status: DC

## 2014-05-13 ENCOUNTER — Encounter: Payer: Medicare Other | Admitting: Physical Therapy

## 2014-05-17 ENCOUNTER — Encounter: Payer: Medicare Other | Admitting: Physical Therapy

## 2014-05-19 ENCOUNTER — Encounter: Payer: Medicare Other | Admitting: Physical Therapy

## 2014-05-29 NOTE — Progress Notes (Signed)
Subjective:    Patient ID: [name], female    DOB: Mar 26, 1941, 73 y.o.   MRN: [id number]  HPI [name] is here for follow up of dizziness and Ct results due to reported history of "brain tumor" by a former neruologist she was seeing.  Ct neg for mass lesion  And dizziness is much improved with Epley manuever  She still has constipation.  Uses prunes.   Does not want RX meds  Not on colace  Allergies  Allergen Reactions  . Lisinopril     REACTION: cough   Past Medical History  Diagnosis Date  . HYPERPARATHYROIDISM UNSPECIFIED 10/20/2007  . HYPERLIPIDEMIA 07/07/2007  . ANEMIA-NOS 07/07/2007  . HYPERTENSION 02/04/2008  . DIVERTICULOSIS, COLON 02/23/2009  . UTI'S, RECURRENT 11/20/2009    kimbrough   . OSTEOPOROSIS 01/01/2008  . SCOLIOSIS 05/25/2009  . FATIGUE 05/25/2009  . PALPITATIONS, OCCASIONAL 07/12/2008  . URINARY INCONTINENCE 07/07/2007  . ADVERSE REACTION TO MEDICATION 07/31/2009  . COLONIC POLYPS, ADENOMATOUS, HX OF   . NEPHROLITHIASIS, HX OF 11/15/2008  . PARATHYROIDECTOMY 01/02/2009  . Seasonal allergies   . Unspecified constipation   . Other acquired absence of organ     parathyroidectomy  . Fever, unspecified   . Abdominal pain, unspecified site   . Disturbance of skin sensation   . Pain in limb   . Chest pain, unspecified   . Family history of diabetes mellitus   . Family history of malignant neoplasm of breast    Past Surgical History  Procedure Laterality Date  . Right oophorectomy    . Parathyroidectomy      Rt Superior open neck exploration   History   Social History  . Marital Status: Married    Spouse Name: N/A    Number of Children: 55  . Years of Education: N/A   Occupational History  . travel agent    Social History Main Topics  . Smoking status: Never Smoker   . Smokeless tobacco: Never Used  . Alcohol Use: 3.0 oz/week    5 Glasses of wine per week     Comment: occ. wine  . Drug Use: No  . Sexual Activity: No   Other Topics Concern  . Not  on file   Social History Narrative   HH of 5    Kids at home now   Married   travel agent.    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 Breast cancer Other     1st degree relative <50  . Diabetes      1st degree relative  . Sudden death Father 37  . Heart disease Father 69    MI  . Heart disease Mother     valve replacement  . Colon cancer Neg Hx   . Stomach cancer Neg Hx    Patient Active Problem List   Diagnosis Date Noted  . Dizziness 04/25/2014  . Vertigo 12/26/2012  . Hypertension, uncontrolled 09/11/2012  . Diastolic dysfunction 62/37/6283  . Sleep disturbance 09/17/2011  . Hypotension 12/26/2010  . Hypotension, postural 12/26/2010  . UTI'S, RECURRENT 11/20/2009  . ADVERSE REACTION TO MEDICATION 07/31/2009  . SCOLIOSIS 05/25/2009  . FATIGUE 05/25/2009  . DIVERTICULOSIS, COLON 02/23/2009  . COLONIC POLYPS, ADENOMATOUS, HX OF 02/23/2009  . PARATHYROIDECTOMY 01/02/2009  . NEPHROLITHIASIS,  HX OF 11/15/2008  . PALPITATIONS, OCCASIONAL 07/12/2008  . HYPERTENSION 02/04/2008  . CONSTIPATION, CHRONIC 01/01/2008  . LEG PAIN, LEFT 01/01/2008  . OSTEOPOROSIS 01/01/2008  . HYPERPARATHYROIDISM UNSPECIFIED 10/20/2007  . HYPERLIPIDEMIA 07/07/2007  . ANEMIA-NOS 07/07/2007  . URINARY INCONTINENCE 07/07/2007   Current Outpatient Prescriptions on File Prior to Visit  Medication Sig Dispense Refill  . aspirin 81 MG tablet Take 81 mg by mouth daily.        [name] atorvastatin (LIPITOR) 20 MG tablet TAKE 1 TABLET EACH DAY.  90 tablet  1  . cholecalciferol (VITAMIN D) 1000 UNITS tablet Take 1,000 Units by mouth daily.      . diazepam (VALIUM) 2 MG tablet Take 1-2 mg by mouth daily as needed.      . diclofenac (VOLTAREN) 75 MG EC tablet Take 75 mg by mouth as needed.       . diphenhydramine-acetaminophen (TYLENOL PM) 25-500 MG TABS Take 0.5 tablets by mouth at bedtime as needed.      .  loratadine (CLARITIN) 10 MG tablet Take 10 mg by mouth daily.      [name] losartan (COZAAR) 100 MG tablet Take one -half tablet daily      . predniSONE (STERAPRED UNI-PAK) 10 MG tablet Take by mouth daily.      . solifenacin (VESICARE) 5 MG tablet Take 1 tablet (5 mg total) by mouth daily.  90 tablet  1   No current facility-administered medications on file prior to visit.       Review of Systems    see HPI Objective:   Physical Exam  Physical Exam  Nursing note and vitals reviewed.  Constitutional: She is oriented to person, place, and time. She appears well-developed and well-nourished.  HENT:  Head: Normocephalic and atraumatic.  Cardiovascular: Normal rate and regular rhythm. Exam reveals no gallop and no friction rub.  No murmur heard.  Pulmonary/Chest: Breath sounds normal. She has no wheezes. She has no rales.  Neurological: She is alert and oriented to person, place, and time.  Nonfocal exam Skin: Skin is warm and dry.  Psychiatric: She has a normal mood and affect. Her behavior is normal.         Assessment & Plan:  Dizziness   Likely BPV  Improved    Consitpation  Advised fiber,  Colace bid with food till results,   Then cut back to 3 times per week  Schedule CPE

## 2014-05-30 ENCOUNTER — Ambulatory Visit (INDEPENDENT_AMBULATORY_CARE_PROVIDER_SITE_OTHER): Payer: Medicare Other | Admitting: Internal Medicine

## 2014-05-30 ENCOUNTER — Encounter: Payer: Self-pay | Admitting: Internal Medicine

## 2014-05-30 VITALS — BP 156/81 | HR 57 | Resp 16 | Ht 62.0 in | Wt 124.0 lb

## 2014-05-30 DIAGNOSIS — R42 Dizziness and giddiness: Secondary | ICD-10-CM | POA: Diagnosis not present

## 2014-05-30 DIAGNOSIS — K5909 Other constipation: Secondary | ICD-10-CM

## 2014-05-30 NOTE — Patient Instructions (Signed)
Give number to [facility] to schedule mammgram  CPE in 12/2014  Take Colace softener  2 capsules twice a day with food then can decrease to once a day or even once 3 times a week

## 2014-06-03 DIAGNOSIS — Z803 Family history of malignant neoplasm of breast: Secondary | ICD-10-CM | POA: Diagnosis not present

## 2014-06-03 DIAGNOSIS — Z1231 Encounter for screening mammogram for malignant neoplasm of breast: Secondary | ICD-10-CM | POA: Diagnosis not present

## 2014-06-08 DIAGNOSIS — Z803 Family history of malignant neoplasm of breast: Secondary | ICD-10-CM | POA: Diagnosis not present

## 2014-06-08 DIAGNOSIS — Z1231 Encounter for screening mammogram for malignant neoplasm of breast: Secondary | ICD-10-CM | POA: Diagnosis not present

## 2014-06-08 DIAGNOSIS — N6489 Other specified disorders of breast: Secondary | ICD-10-CM | POA: Diagnosis not present

## 2014-06-10 ENCOUNTER — Other Ambulatory Visit: Payer: Self-pay | Admitting: *Deleted

## 2014-06-10 DIAGNOSIS — H52209 Unspecified astigmatism, unspecified eye: Secondary | ICD-10-CM | POA: Diagnosis not present

## 2014-06-10 DIAGNOSIS — H532 Diplopia: Secondary | ICD-10-CM | POA: Diagnosis not present

## 2014-06-10 DIAGNOSIS — Z961 Presence of intraocular lens: Secondary | ICD-10-CM | POA: Diagnosis not present

## 2014-06-10 MED ORDER — DIAZEPAM 2 MG PO TABS: 1.0000 mg | ORAL_TABLET | Freq: Every day | ORAL | Status: DC | PRN

## 2014-06-10 NOTE — Telephone Encounter (Signed)
Received fax request

## 2014-06-10 NOTE — Telephone Encounter (Signed)
Verbal called in to [employer]

## 2014-06-11 ENCOUNTER — Telehealth: Payer: Self-pay | Admitting: Internal Medicine

## 2014-06-11 NOTE — Telephone Encounter (Signed)
Kim  Call pt - she goes by [name] and let her know that I have seen her breast ultrasound report.  No abnormality was seen in her right breast. Advise that she will need a repeat mammogram in January 2016

## 2014-06-13 NOTE — Telephone Encounter (Signed)
Spoke w/ pt & gave her results, the patient is reporting that the Mammogram dept instructed her to be rechecked in 6 months.Sending

## 2014-06-13 NOTE — Telephone Encounter (Signed)
Sending to DR. For [employer]

## 2014-06-21 ENCOUNTER — Encounter: Payer: Self-pay | Admitting: *Deleted

## 2014-06-24 ENCOUNTER — Other Ambulatory Visit: Payer: Self-pay | Admitting: *Deleted

## 2014-06-27 ENCOUNTER — Encounter: Payer: Self-pay | Admitting: *Deleted

## 2014-06-29 ENCOUNTER — Encounter: Payer: Self-pay | Admitting: *Deleted

## 2014-07-08 ENCOUNTER — Ambulatory Visit: Payer: Medicare Other | Admitting: Cardiology

## 2014-07-20 DIAGNOSIS — M67919 Unspecified disorder of synovium and tendon, unspecified shoulder: Secondary | ICD-10-CM | POA: Diagnosis not present

## 2014-07-20 DIAGNOSIS — M719 Bursopathy, unspecified: Secondary | ICD-10-CM | POA: Diagnosis not present

## 2014-07-20 DIAGNOSIS — M5030 Other cervical disc degeneration, unspecified cervical region: Secondary | ICD-10-CM | POA: Diagnosis not present

## 2014-08-01 ENCOUNTER — Other Ambulatory Visit: Payer: Self-pay | Admitting: *Deleted

## 2014-08-01 MED ORDER — ATORVASTATIN CALCIUM 20 MG PO TABS: ORAL_TABLET | ORAL | Status: DC

## 2014-08-01 NOTE — Telephone Encounter (Signed)
Refill request for Lipitor

## 2014-08-31 ENCOUNTER — Ambulatory Visit: Payer: Medicare Other | Admitting: Cardiology

## 2014-08-31 DIAGNOSIS — Z23 Encounter for immunization: Secondary | ICD-10-CM | POA: Diagnosis not present

## 2014-09-19 ENCOUNTER — Encounter: Payer: Self-pay | Admitting: Internal Medicine

## 2014-09-21 ENCOUNTER — Ambulatory Visit: Payer: Medicare Other | Admitting: Physician Assistant

## 2014-09-21 DIAGNOSIS — Z85828 Personal history of other malignant neoplasm of skin: Secondary | ICD-10-CM | POA: Diagnosis not present

## 2014-09-21 DIAGNOSIS — D2272 Melanocytic nevi of left lower limb, including hip: Secondary | ICD-10-CM | POA: Diagnosis not present

## 2014-09-21 DIAGNOSIS — D2372 Other benign neoplasm of skin of left lower limb, including hip: Secondary | ICD-10-CM | POA: Diagnosis not present

## 2014-10-07 ENCOUNTER — Encounter: Payer: Self-pay | Admitting: Cardiology

## 2014-10-07 ENCOUNTER — Ambulatory Visit (INDEPENDENT_AMBULATORY_CARE_PROVIDER_SITE_OTHER): Payer: Medicare Other | Admitting: Cardiology

## 2014-10-07 VITALS — BP 140/76 | HR 66 | Ht 62.0 in | Wt 122.8 lb

## 2014-10-07 DIAGNOSIS — I10 Essential (primary) hypertension: Secondary | ICD-10-CM

## 2014-10-07 NOTE — Progress Notes (Signed)
10/07/2014 [name]   26-Feb-1941  [id number]  Primary Physician Teresa Pillar, MD Primary Cardiologist: Dr. Aundra Stein  HPI:  The patient is a 73 year old female, followed by Dr. Aundra Stein, who presents to clinic today for routine 6 month follow-up. She has a history of labile hypertension with systolic blood pressures varying around 100- 160. She feels best when her blood pressure is higher. She gets occasional orthostatic-type symptoms (lightheadedness with standing). She denies syncope/near syncope. She is on 50 mg of losartan daily. She takes this at night, to reduce symptoms during the day. This has helped quite a bit. She also has a history of treated hyperlipidemia, followed by her PCP. She is on low-dose Lipitor. Last lipid profile was in February 2015 and cholesterol was well-controlled. LDL was 69. HDL 68. Total cholesterol was 148. From a cardiac standpoint, she denies any anginal like symptoms and no dyspnea. She is able to perform ADLs without difficulty. She denies any history of diabetes and denies tobacco use. She reports full medication compliance.   Current Outpatient Prescriptions  Medication Sig Dispense Refill  . aspirin 81 MG tablet Take 81 mg by mouth daily.      [name] atorvastatin (LIPITOR) 20 MG tablet TAKE 1 TABLET EACH DAY. 90 tablet 2  . cholecalciferol (VITAMIN D) 1000 UNITS tablet Take 1,000 Units by mouth daily.    [name] losartan (COZAAR) 100 MG tablet Take one -half tablet daily    . diazepam (VALIUM) 2 MG tablet Take 0.5-1 tablets (1-2 mg total) by mouth daily as needed. 30 tablet 1  . diclofenac (VOLTAREN) 75 MG EC tablet Take 75 mg by mouth as needed.     . diphenhydramine-acetaminophen (TYLENOL PM) 25-500 MG TABS Take 0.5 tablets by mouth at bedtime as needed.    . loratadine (CLARITIN) 10 MG tablet Take 10 mg by mouth daily.     No current facility-administered medications for this visit.    Allergies  Allergen Reactions  . Lisinopril     REACTION:  cough    History   Social History  . Marital Status: Married    Spouse Name: N/A    Number of Children: 1  . Years of Education: N/A   Occupational History  . travel agent    Social History Main Topics  . Smoking status: Never Smoker   . Smokeless tobacco: Never Used  . Alcohol Use: 3.0 oz/week    5 Glasses of wine per week     Comment: occ. wine  . Drug Use: No  . Sexual Activity: No   Other Topics Concern  . Not on file   Social History Narrative   HH of 5    Kids at home now   Married   travel agent.    2-3 c coffee in am    ocass wine    G3P2vaginal delivery   Pt cell [phone]              Review of Systems: General: negative for chills, fever, night sweats or weight changes.  Cardiovascular: negative for chest pain, dyspnea on exertion, edema, orthopnea, palpitations, paroxysmal nocturnal dyspnea or shortness of breath Dermatological: negative for rash Respiratory: negative for cough or wheezing Urologic: negative for hematuria Abdominal: negative for nausea, vomiting, diarrhea, bright red blood per rectum, melena, or hematemesis Neurologic: negative for visual changes, syncope, or dizziness All other systems reviewed and are otherwise negative except as noted above.    Blood pressure 140/76, pulse 66, height 5'  2" (1.575 m), weight 122 lb 12.8 oz (55.702 kg).  General appearance: alert, cooperative and no distress Neck: no carotid bruit, no JVD and thyroid not enlarged, symmetric, no tenderness/mass/nodules Lungs: clear to auscultation bilaterally Heart: regular rate and rhythm, S1, S2 normal, no murmur, click, rub or gallop Extremities: no LEE Pulses: 2+ and symmetric Skin: warm and dry Neurologic: Grossly normal  EKG not performed  ASSESSMENT AND PLAN:   1. Hypertension: Patient has a history of labile systolic pressures ranging from the 110s-140s. She develops orthostatic like symptoms with lower systolic pressures. She feels best when her  blood pressures are higher. She is only on one antihypertensive, low-dose losartan 50 mg daily. I recommended that she continue to take this medication at night to eliminate symptoms during the day. Also recommended use of compression stockings to help with reduce orthostatic symptoms.  2. Hyperlipidemia: On low-dose Lipitor. Lipid panel is followed by her PCP. Last lipid panel demonstrated satisfactory levels with LDL at goal below 100.   PLAN  Continue current medication regimen. Patient instructed use compression stockings to help prevent orthostatic symptoms. Follow-up with Dr. Aundra Stein in 6 months for reevaluation.  [name], [name] 10/07/2014 9:55 AM

## 2014-10-07 NOTE — Patient Instructions (Signed)
PER BRITTANY SIMMONS, PA-C RECOMMENDS FOR YOU TO WEAR COMPRESSION HOSE ON A DAILY BASIS  Your physician wants you to follow-up in: [place] DR. Aundra Dubin You will receive a reminder letter in the mail two months in advance. If you don't receive a letter, please call our office to schedule the follow-up appointment.   Your physician recommends that you continue on your current medications as directed. Please refer to the Current Medication list given to you today.

## 2014-10-19 ENCOUNTER — Telehealth: Payer: Self-pay

## 2014-10-19 NOTE — Telephone Encounter (Signed)
Called to talk with patient about CPE, I noticed that the last one was done on 12/29/13 and she has one scheduled for 12/20/14 and I wanted to make sure her insurance would cover if they are not a year apart.

## 2014-10-20 ENCOUNTER — Telehealth: Payer: Self-pay | Admitting: *Deleted

## 2014-10-20 ENCOUNTER — Telehealth: Payer: Self-pay

## 2014-10-20 NOTE — Telephone Encounter (Signed)
I spoke to [facility] and she said that she was given Tramadol by the ED doctor back in March. She was given #30. She still has #8 left. She is using this for her back pain. She would like to get a refill.-eh

## 2014-10-20 NOTE — Telephone Encounter (Signed)
Cianni Manny [phone] Jerilynn Mages)  [name] was asking questions about Tramadol and needing a refill, she only has 8 pills left and she takes once a day. I did not see a prescription for this. She was wanting to know how long it takes to get addicted and what where the effects.

## 2014-10-20 NOTE — Telephone Encounter (Signed)
[name] called back and we rescheduled her CPE

## 2014-10-20 NOTE — Telephone Encounter (Signed)
I spoke with Dr. Coralyn Mark about the Tramadol and she said she would need to see Minge. I let Minge know and Minge would like to wait till her next appointment since she still has 8 pills left. -eh

## 2014-11-24 DIAGNOSIS — M545 Low back pain: Secondary | ICD-10-CM | POA: Diagnosis not present

## 2014-11-24 DIAGNOSIS — S335XXA Sprain of ligaments of lumbar spine, initial encounter: Secondary | ICD-10-CM | POA: Diagnosis not present

## 2014-12-15 DIAGNOSIS — N6489 Other specified disorders of breast: Secondary | ICD-10-CM | POA: Diagnosis not present

## 2014-12-15 DIAGNOSIS — Z09 Encounter for follow-up examination after completed treatment for conditions other than malignant neoplasm: Secondary | ICD-10-CM | POA: Diagnosis not present

## 2014-12-19 LAB — HM MAMMOGRAPHY

## 2014-12-20 ENCOUNTER — Encounter: Payer: Medicare Other | Admitting: Internal Medicine

## 2014-12-21 ENCOUNTER — Telehealth: Payer: Self-pay | Admitting: Internal Medicine

## 2014-12-21 NOTE — Telephone Encounter (Signed)
Call pt and let her know that I have seen her January mammogram report . The radiologist feels the area in her right breast is smaller but he does want a follow up in 6 months

## 2014-12-22 ENCOUNTER — Encounter: Payer: Self-pay | Admitting: *Deleted

## 2014-12-22 NOTE — Telephone Encounter (Signed)
I spoke with [name] and discussed the recommendations for her mammogram in 6 months

## 2014-12-30 ENCOUNTER — Other Ambulatory Visit: Payer: Self-pay | Admitting: *Deleted

## 2014-12-30 DIAGNOSIS — Z0000 Encounter for general adult medical examination without abnormal findings: Secondary | ICD-10-CM

## 2015-01-02 ENCOUNTER — Encounter: Payer: Medicare Other | Admitting: Internal Medicine

## 2015-01-05 ENCOUNTER — Ambulatory Visit (INDEPENDENT_AMBULATORY_CARE_PROVIDER_SITE_OTHER): Payer: Medicare Other | Admitting: Internal Medicine

## 2015-01-05 ENCOUNTER — Encounter: Payer: Self-pay | Admitting: Internal Medicine

## 2015-01-05 VITALS — BP 154/80 | HR 59 | Resp 16 | Ht 60.25 in | Wt 120.0 lb

## 2015-01-05 DIAGNOSIS — Z23 Encounter for immunization: Secondary | ICD-10-CM

## 2015-01-05 DIAGNOSIS — M810 Age-related osteoporosis without current pathological fracture: Secondary | ICD-10-CM | POA: Diagnosis not present

## 2015-01-05 DIAGNOSIS — Z0000 Encounter for general adult medical examination without abnormal findings: Secondary | ICD-10-CM | POA: Diagnosis not present

## 2015-01-05 DIAGNOSIS — Z1211 Encounter for screening for malignant neoplasm of colon: Secondary | ICD-10-CM | POA: Diagnosis not present

## 2015-01-05 LAB — HEMOCCULT GUIAC POC 1CARD (OFFICE): Fecal Occult Blood, POC: NEGATIVE

## 2015-01-05 NOTE — Progress Notes (Signed)
Subjective:    Patient ID: [name], female    DOB: [birth year], 74 y.o.   MRN: [id number]  HPI  09/2014 cardiology note ASSESSMENT AND PLAN:   1. Hypertension: Patient has a history of labile systolic pressures ranging from the 110s-140s. She develops orthostatic like symptoms with lower systolic pressures. She feels best when her blood pressures are higher. She is only on one antihypertensive, low-dose losartan 50 mg daily. I recommended that she continue to take this medication at night to eliminate symptoms during the day. Also recommended use of compression stockings to help with reduce orthostatic symptoms.  2. Hyperlipidemia: On low-dose Lipitor. Lipid panel is followed by her PCP. Last lipid panel demonstrated satisfactory levels with LDL at goal below 100.   PLAN Continue current medication regimen. Patient instructed use compression stockings to help prevent orthostatic symptoms. Follow-up with Dr. Aundra Dubin in 6 months for reevaluation.  SIMMONS, BRITTAINYPA-C  TODAY    [name] is here for CPE  HM:  Needs TDap , shingles, and unclear about Pneumovax.    Had colonoscopy 2014  ,  Mm done 11/2014,  She is a nonsmoker  Osteoporosis  Formerly on fosamax for many years. Has been for about 8 years  Last Dexa 2012  Abnormal mm:  Asymmetry in R breast felt to be benign  Pt aware she needs follow up mm in 05/2015   Allergies  Allergen Reactions  . Lisinopril     REACTION: cough   Past Medical History  Diagnosis Date  . HYPERPARATHYROIDISM UNSPECIFIED 10/20/2007  . HYPERLIPIDEMIA 07/07/2007  . ANEMIA-NOS 07/07/2007  . HYPERTENSION 02/04/2008  . DIVERTICULOSIS, COLON 02/23/2009  . UTI'S, RECURRENT 11/20/2009    kimbrough   . OSTEOPOROSIS 01/01/2008  . SCOLIOSIS 05/25/2009  . FATIGUE 05/25/2009  . PALPITATIONS, OCCASIONAL 07/12/2008  . URINARY INCONTINENCE 07/07/2007  . ADVERSE REACTION TO MEDICATION 07/31/2009  . COLONIC POLYPS, ADENOMATOUS, HX OF   . NEPHROLITHIASIS, HX OF  11/15/2008  . PARATHYROIDECTOMY 01/02/2009  . Seasonal allergies   . Unspecified constipation   . Other acquired absence of organ     parathyroidectomy  . Fever, unspecified   . Abdominal pain, unspecified site   . Disturbance of skin sensation   . Pain in limb   . Chest pain, unspecified   . Family history of diabetes mellitus   . Family history of malignant neoplasm of breast    Past Surgical History  Procedure Laterality Date  . Right oophorectomy    . Parathyroidectomy      Rt Superior open neck exploration   History   Social History  . Marital Status: Married    Spouse Name: N/A  . Number of Children: 5  . Years of Education: N/A   Occupational History  . travel agent    Social History Main Topics  . Smoking status: Never Smoker   . Smokeless tobacco: Never Used  . Alcohol Use: 3.0 oz/week    5 Glasses of wine per week     Comment: occ. wine  . Drug Use: No  . Sexual Activity: No   Other Topics Concern  . Not on file   Social History Narrative   HH of 5    Kids at home now   Married   travel agent.    2-3 c coffee in am    ocass wine    G3P2vaginal delivery   Pt cell [phone]            Family  History  Problem Relation Age of Onset  . Breast cancer Other     1st degree relative <50  . Diabetes      1st degree relative  . Sudden death Father 36  . Heart disease Father 57    MI  . Heart disease Mother     valve replacement  . Colon cancer Neg Hx   . Stomach cancer Neg Hx    Patient Active Problem List   Diagnosis Date Noted  . Dizziness 04/25/2014  . Vertigo 12/26/2012  . Hypertension, uncontrolled 09/11/2012  . Diastolic dysfunction 18/84/1660  . Sleep disturbance 09/17/2011  . Hypotension 12/26/2010  . Hypotension, postural 12/26/2010  . UTI'S, RECURRENT 11/20/2009  . ADVERSE REACTION TO MEDICATION 07/31/2009  . SCOLIOSIS 05/25/2009  . FATIGUE 05/25/2009  . DIVERTICULOSIS, COLON 02/23/2009  . COLONIC POLYPS, ADENOMATOUS, HX OF  02/23/2009  . PARATHYROIDECTOMY 01/02/2009  . NEPHROLITHIASIS, HX OF 11/15/2008  . PALPITATIONS, OCCASIONAL 07/12/2008  . HYPERTENSION 02/04/2008  . CONSTIPATION, CHRONIC 01/01/2008  . LEG PAIN, LEFT 01/01/2008  . OSTEOPOROSIS 01/01/2008  . HYPERPARATHYROIDISM UNSPECIFIED 10/20/2007  . HYPERLIPIDEMIA 07/07/2007  . ANEMIA-NOS 07/07/2007  . URINARY INCONTINENCE 07/07/2007   Current Outpatient Prescriptions on File Prior to Visit  Medication Sig Dispense Refill  . aspirin 81 MG tablet Take 81 mg by mouth daily.      [name] atorvastatin (LIPITOR) 20 MG tablet TAKE 1 TABLET EACH DAY. 90 tablet 2  . cholecalciferol (VITAMIN D) 1000 UNITS tablet Take 1,000 Units by mouth daily.    . diazepam (VALIUM) 2 MG tablet Take 0.5-1 tablets (1-2 mg total) by mouth daily as needed. 30 tablet 1  . diclofenac (VOLTAREN) 75 MG EC tablet Take 75 mg by mouth as needed.     . diphenhydramine-acetaminophen (TYLENOL PM) 25-500 MG TABS Take 0.5 tablets by mouth at bedtime as needed.    . loratadine (CLARITIN) 10 MG tablet Take 10 mg by mouth daily.    [name] losartan (COZAAR) 100 MG tablet Take one -half tablet daily     No current facility-administered medications on file prior to visit.      Review of Systems  Respiratory: Negative for cough, chest tightness, shortness of breath and wheezing.   Cardiovascular: Negative for chest pain, palpitations and leg swelling.  All other systems reviewed and are negative.      Objective:   Physical Exam Physical Exam  Nursing note and vitals reviewed.  Constitutional: She is oriented to person, place, and time. She appears well-developed and well-nourished.  HENT:  Head: Normocephalic and atraumatic.  Right Ear: Tympanic membrane and ear canal normal. No drainage. Tympanic membrane is not injected and not erythematous.  Left Ear: Tympanic membrane and ear canal normal. No drainage. Tympanic membrane is not injected and not erythematous.  Nose: Nose normal. Right sinus  exhibits no maxillary sinus tenderness and no frontal sinus tenderness. Left sinus exhibits no maxillary sinus tenderness and no frontal sinus tenderness.  Mouth/Throat: Oropharynx is clear and moist. No oral lesions. No oropharyngeal exudate.  Eyes: Conjunctivae and EOM are normal. Pupils are equal, round, and reactive to light.  Neck: Normal range of motion. Neck supple. No JVD present. Carotid bruit is not present. No mass and no thyromegaly present.  Cardiovascular: Normal rate, regular rhythm, S1 normal, S2 normal and intact distal pulses. Exam reveals no gallop and no friction rub.  No murmur heard.  Pulses:  Carotid pulses are 2+ on the right side, and 2+ on the left side.  Dorsalis pedis pulses are 2+ on the right side, and 2+ on the left side.  No carotid bruit. No LE edema  Pulmonary/Chest: Breath sounds normal. She has no wheezes. She has no rales. She exhibits no tenderness.   Breast no discrete mass no nipple discharge no axillary adenopathy bilaterally Abdominal: Soft. Bowel sounds are normal. She exhibits no distension and no mass. There is no hepatosplenomegaly. There is no tenderness. There is no CVA tenderness.  Rectal no mass guaiac neg Musculoskeletal: Normal range of motion.  No active synovitis to joints.  Lymphadenopathy:  She has no cervical adenopathy.  She has no axillary adenopathy.  Right: No inguinal and no supraclavicular adenopathy present.  Left: No inguinal and no supraclavicular adenopathy present.  Neurological: She is alert and oriented to person, place, and time. She has normal strength and normal reflexes. She displays no tremor. No cranial nerve deficit or sensory deficit. Coordination and gait normal.  Skin: Skin is warm and dry. No rash noted. No cyanosis. Nails show no clubbing.  Psychiatric: She has a normal mood and affect. Her speech is normal and behavior is normal. Cognition and memory are normal.          Assessment & Plan:  HM  TDap today  ,  She will check with former provider regarding Pneumovax,  Pt is a non-smoker,  Will schedule Dexa  HTN conitnue meds  Hyperlipidemia continue meds  Osteoporosis:  Will set up DEXA scan     Refer to PT for balance training.   She has been told to be off Calcium when she was hyperparathyroidsim.   Asymmetry R breast on mm:  Pt counseled to have repeat mm in July  She voices understanding   History of hyperPTH  S/P parathyroidectomy  MIld scoliosis  Dvierticulosis

## 2015-01-09 ENCOUNTER — Encounter: Payer: Self-pay | Admitting: *Deleted

## 2015-01-10 ENCOUNTER — Encounter: Payer: Medicare Other | Admitting: Internal Medicine

## 2015-01-10 DIAGNOSIS — M810 Age-related osteoporosis without current pathological fracture: Secondary | ICD-10-CM | POA: Diagnosis not present

## 2015-01-16 ENCOUNTER — Telehealth: Payer: Self-pay | Admitting: Internal Medicine

## 2015-01-16 NOTE — Telephone Encounter (Signed)
[name]    Call [facility] and see if pt had her DEXA done  - it was scheduled for 2/23  If read, please get faxed result  thanks

## 2015-01-16 NOTE — Telephone Encounter (Signed)
Results are being faxed over -eh

## 2015-01-18 ENCOUNTER — Ambulatory Visit: Payer: Medicare Other | Attending: Internal Medicine | Admitting: Physical Therapy

## 2015-01-18 ENCOUNTER — Encounter: Payer: Self-pay | Admitting: Physical Therapy

## 2015-01-18 DIAGNOSIS — R42 Dizziness and giddiness: Secondary | ICD-10-CM | POA: Diagnosis not present

## 2015-01-18 DIAGNOSIS — E785 Hyperlipidemia, unspecified: Secondary | ICD-10-CM | POA: Diagnosis not present

## 2015-01-18 DIAGNOSIS — I10 Essential (primary) hypertension: Secondary | ICD-10-CM | POA: Insufficient documentation

## 2015-01-18 DIAGNOSIS — R531 Weakness: Secondary | ICD-10-CM | POA: Insufficient documentation

## 2015-01-18 DIAGNOSIS — M810 Age-related osteoporosis without current pathological fracture: Secondary | ICD-10-CM | POA: Diagnosis not present

## 2015-01-18 DIAGNOSIS — R293 Abnormal posture: Secondary | ICD-10-CM | POA: Diagnosis not present

## 2015-01-18 NOTE — Therapy (Signed)
[facility] [address] [place], [place], [address] Phone: [phone]   Fax:  [phone]  Physical Therapy Evaluation  Patient Details  Name: [name] MRN: [id number] Date of Birth: 03-Sep-1941 Referring Provider:  Lanice Shirts, *  Encounter Date: 01/18/2015      PT End of Session - 01/18/15 1321    Visit Number 1   Date for PT Re-Evaluation 03/01/15   PT Start Time 1230   PT Stop Time 1315   PT Time Calculation (min) 45 min   Activity Tolerance Patient tolerated treatment well   Behavior During Therapy [facility] for tasks assessed/performed      Past Medical History  Diagnosis Date  . HYPERPARATHYROIDISM UNSPECIFIED 10/20/2007  . HYPERLIPIDEMIA 07/07/2007  . ANEMIA-NOS 07/07/2007  . HYPERTENSION 02/04/2008  . DIVERTICULOSIS, COLON 02/23/2009  . UTI'S, RECURRENT 11/20/2009    kimbrough   . OSTEOPOROSIS 01/01/2008  . SCOLIOSIS 05/25/2009  . FATIGUE 05/25/2009  . PALPITATIONS, OCCASIONAL 07/12/2008  . URINARY INCONTINENCE 07/07/2007  . ADVERSE REACTION TO MEDICATION 07/31/2009  . COLONIC POLYPS, ADENOMATOUS, HX OF   . NEPHROLITHIASIS, HX OF 11/15/2008  . PARATHYROIDECTOMY 01/02/2009  . Seasonal allergies   . Unspecified constipation   . Other acquired absence of organ     parathyroidectomy  . Fever, unspecified   . Abdominal pain, unspecified site   . Disturbance of skin sensation   . Pain in limb   . Chest pain, unspecified   . Family history of diabetes mellitus   . Family history of malignant neoplasm of breast     Past Surgical History  Procedure Laterality Date  . Right oophorectomy    . Parathyroidectomy      Rt Superior open neck exploration    There were no vitals taken for this visit.  Visit Diagnosis:  Weakness - Plan: PT plan of care cert/re-cert      Subjective Assessment - 01/18/15 1307    Symptoms Patient was on medication for osteoporosis in the past but stopped 10 years ago.  Patients last T- score 4 years ago is -3.3.  Patient has difficulty with steps and walking on treadmill.   Limitations Sitting   How long can you sit comfortably? 1 hour   Diagnostic tests Bone density exam done on 01/09/2015   Patient Stated Goals Improved balance, work on stairs, education on execises          [facility] PT Assessment - 01/18/15 0001    Assessment   Medical Diagnosis Osteoporosis   Onset Date 11/18/14   Next MD Visit None   Prior Therapy None   Precautions   Precautions Back   Precaution Booklet Issued No   Precaution Comments No joint mobilization; No forward bending   Balance Screen   Has the patient fallen in the past 6 months No   Has the patient had a decrease in activity level because of a fear of falling?  Yes  does not walk outside due to fear of falling   Is the patient reluctant to leave their home because of a fear of falling?  No   Home Environment   Living Enviornment Private residence   Home Access Stairs to enter   Home Layout Two level   Posture/Postural Control   Posture/Postural Control Postural limitations   Postural Limitations Rounded Shoulders;Forward head;Increased thoracic kyphosis;Flexed trunk   Posture Comments Height is 60.25 inches  Scoliosis, hips shifted to left, slight abdominal protrusion   Strength  Overall Strength --  trunk strength is 3/5   Right/Left Hip --  bilateral hip strength is 3+/5   Right/Left Ankle --  bilateral ankles 4/5   Ambulation/Gait   Stairs --  walk step to step pattern with railing   Berg Balance Test   Sit to Stand Able to stand  independently using hands   Standing Unsupported Able to stand safely 2 minutes   Sitting with Back Unsupported but Feet Supported on Floor or Stool Able to sit safely and securely 2 minutes   Stand to Sit Controls descent by using hands   Transfers Able to transfer safely, minor use of hands   Standing Unsupported with Eyes Closed Able to stand 10 seconds safely    Standing Ubsupported with Feet Together Able to place feet together independently and stand 1 minute safely   From Standing, Reach Forward with Outstretched Arm Can reach confidently >25 cm (10")   From Standing Position, Pick up Object from Floor Able to pick up shoe safely and easily   From Standing Position, Turn to Look Behind Over each Shoulder Looks behind from both sides and weight shifts well   Turn 360 Degrees Able to turn 360 degrees safely in 4 seconds or less   Standing Unsupported, Alternately Place Feet on Step/Stool Able to stand independently and safely and complete 8 steps in 20 seconds   Standing Unsupported, One Foot in Front Able to place foot tandem independently and hold 30 seconds   Standing on One Leg Able to lift leg independently and hold 5-10 seconds   Total Score 53                          PT Education - 01/18/15 1321    Education provided Yes   Person(s) Educated Patient   Methods Explanation;Handout   Comprehension Verbalized understanding          PT Short Term Goals - 01/18/15 1323    PT SHORT TERM GOAL #1   Title understand correct body mechanics with daily home tasks and correct posture   Time 3   Period Weeks   Status New   PT SHORT TERM GOAL #2   Title understand ways to avoid falls   Time 3   Period Weeks   Status New           PT Long Term Goals - 01/18/15 1324    PT LONG TERM GOAL #1   Title independent with home exercise program and how to exercise at the gym with correct posture   Time 6   Period Weeks   Status New   PT LONG TERM GOAL #2   Title Berg balance score is 56/56   Time 6   Period Weeks   Status New   PT LONG TERM GOAL #3   Title perform daily activities with pain decreased >/= 50%   Time 6   Period Weeks   Status New               Plan - 01/18/15 1322    Clinical Impression Statement Patient has decreased balance, decreased trunk, hip and ankle strength, and poor posture   Pt will  benefit from skilled therapeutic intervention in order to improve on the following deficits Impaired flexibility;Improper body mechanics;Postural dysfunction;Decreased activity tolerance;Decreased knowledge of precautions;Pain;Increased muscle spasms;Decreased balance;Decreased mobility;Decreased strength   Rehab Potential Good   PT Frequency 2x / week   PT Duration 6  weeks   PT Treatment/Interventions Moist Heat;Therapeutic activities;Patient/family education;Passive range of motion;Therapeutic exercise;Ultrasound;Balance training;Manual techniques;Energy conservation;Cryotherapy;Electrical Stimulation;Functional mobility training   PT Next Visit Plan instruction on proper body mechanics    Consulted and Agree with Plan of Care Patient          G-Codes - Jan 23, 2015 1326    Functional Assessment Tool Used Merrilee Jansky balance   Functional Limitation Mobility: Walking and moving around   Mobility: Walking and Moving Around Current Status [phone]) At least 20 percent but less than 40 percent impaired, limited or restricted   Mobility: Walking and Moving Around Goal Status [phone]) At least 1 percent but less than 20 percent impaired, limited or restricted       Problem List Patient Active Problem List   Diagnosis Date Noted  . Dizziness 04/25/2014  . Vertigo 12/26/2012  . Hypertension, uncontrolled 09/11/2012  . Diastolic dysfunction 70/26/3785  . Sleep disturbance 09/17/2011  . Hypotension 12/26/2010  . Hypotension, postural 12/26/2010  . UTI'S, RECURRENT 11/20/2009  . ADVERSE REACTION TO MEDICATION 07/31/2009  . SCOLIOSIS 05/25/2009  . FATIGUE 05/25/2009  . DIVERTICULOSIS, COLON 02/23/2009  . COLONIC POLYPS, ADENOMATOUS, HX OF 02/23/2009  . PARATHYROIDECTOMY 01/02/2009  . NEPHROLITHIASIS, HX OF 11/15/2008  . PALPITATIONS, OCCASIONAL 07/12/2008  . HYPERTENSION 02/04/2008  . CONSTIPATION, CHRONIC 01/01/2008  . LEG PAIN, LEFT 01/01/2008  . OSTEOPOROSIS 01/01/2008  . HYPERPARATHYROIDISM  UNSPECIFIED 10/20/2007  . HYPERLIPIDEMIA 07/07/2007  . ANEMIA-NOS 07/07/2007  . URINARY INCONTINENCE 07/07/2007    GRAY,CHERYL, PT 2015-01-23, 1:30 PM  [facility] [address] [place], [place], [address] Phone: [phone]   Fax:  [phone]

## 2015-01-18 NOTE — Patient Instructions (Signed)
Gave patient information on osteoporosis, postures to avoid to prevent spinal fractures, eating enough calcium and tips to avoid falls. Patient verbally understands.

## 2015-01-25 ENCOUNTER — Encounter: Payer: Self-pay | Admitting: *Deleted

## 2015-01-25 ENCOUNTER — Encounter: Payer: Self-pay | Admitting: Internal Medicine

## 2015-01-25 ENCOUNTER — Ambulatory Visit (INDEPENDENT_AMBULATORY_CARE_PROVIDER_SITE_OTHER): Payer: Medicare Other | Admitting: Internal Medicine

## 2015-01-25 VITALS — BP 159/76 | HR 66 | Resp 16 | Ht 60.25 in | Wt 119.0 lb

## 2015-01-25 DIAGNOSIS — I159 Secondary hypertension, unspecified: Secondary | ICD-10-CM

## 2015-01-25 DIAGNOSIS — R8299 Other abnormal findings in urine: Secondary | ICD-10-CM | POA: Diagnosis not present

## 2015-01-25 DIAGNOSIS — M810 Age-related osteoporosis without current pathological fracture: Secondary | ICD-10-CM

## 2015-01-25 DIAGNOSIS — E785 Hyperlipidemia, unspecified: Secondary | ICD-10-CM

## 2015-01-25 DIAGNOSIS — R8290 Unspecified abnormal findings in urine: Secondary | ICD-10-CM | POA: Diagnosis not present

## 2015-01-25 LAB — COMPREHENSIVE METABOLIC PANEL
ALT: 12 U/L (ref 0–35)
AST: 18 U/L (ref 0–37)
Albumin: 4.1 g/dL (ref 3.5–5.2)
Alkaline Phosphatase: 61 U/L (ref 39–117)
BUN: 15 mg/dL (ref 6–23)
CO2: 23 mEq/L (ref 19–32)
Calcium: 9.5 mg/dL (ref 8.4–10.5)
Chloride: 104 mEq/L (ref 96–112)
Creat: 0.64 mg/dL (ref 0.50–1.10)
Glucose, Bld: 86 mg/dL (ref 70–99)
Potassium: 4.3 mEq/L (ref 3.5–5.3)
Sodium: 142 mEq/L (ref 135–145)
Total Bilirubin: 1.2 mg/dL (ref 0.2–1.2)
Total Protein: 6.5 g/dL (ref 6.0–8.3)

## 2015-01-25 LAB — POCT URINALYSIS DIPSTICK
Bilirubin, UA: NEGATIVE
Glucose, UA: NEGATIVE
Ketones, UA: NEGATIVE
Protein, UA: NEGATIVE
Spec Grav, UA: 1.01
Urobilinogen, UA: NEGATIVE
pH, UA: 6.5

## 2015-01-25 LAB — LIPID PANEL
Cholesterol: 132 mg/dL (ref 0–200)
HDL: 68 mg/dL (ref >=46)
LDL Cholesterol: 51 mg/dL (ref 0–99)
Total CHOL/HDL Ratio: 1.9 Ratio
Triglycerides: 64 mg/dL (ref <150)
VLDL: 13 mg/dL (ref 0–40)

## 2015-01-25 LAB — TSH: TSH: 1.263 u[IU]/mL (ref 0.350–4.500)

## 2015-01-25 MED ORDER — DIAZEPAM 2 MG PO TABS: 1.0000 mg | ORAL_TABLET | Freq: Every day | ORAL | Status: DC | PRN

## 2015-01-25 NOTE — Addendum Note (Signed)
Addended by: Gretchen Short on: 01/25/2015 11:13 AM   Modules accepted: Orders

## 2015-01-25 NOTE — Progress Notes (Signed)
Subjective:    Patient ID: [name], female    DOB: 1941/04/23, 74 y.o.   MRN: [id number]  HPI 01/05/2015 note Assessment & Plan:  HM TDap today , She will check with former provider regarding Pneumovax, Pt is a non-smoker, Will schedule Dexa  HTN conitnue meds  Hyperlipidemia continue meds  Osteoporosis: Will set up DEXA scan Refer to PT for balance training. She has been told to be off Calcium when she was hyperparathyroidsim.   Asymmetry R breast on mm: Pt counseled to have repeat mm in July She voices understanding   History of hyperPTH S/P parathyroidectomy  MIld scoliosis  Dvierticulosis        TODAY  [name] is here to discuss recent DEXA results.  She has known osteroporosis and was treated with Fosamax and Actonel  for  Over 10       Years.   Was on Prempro prior to that  H/O remote hyperPTH S/P parathryoidectomy .  Calcium levels last 2 years normal    See current DEXA  Worsening in both hips.    See U/A pt asymptomatic  No dysuria  Some chronic urgency   She reports long history of mixed incontinence formerly on Vesicare but became too expensive    Allergies  Allergen Reactions  . Lisinopril     REACTION: cough   Past Medical History  Diagnosis Date  . HYPERPARATHYROIDISM UNSPECIFIED 10/20/2007  . HYPERLIPIDEMIA 07/07/2007  . ANEMIA-NOS 07/07/2007  . HYPERTENSION 02/04/2008  . DIVERTICULOSIS, COLON 02/23/2009  . UTI'S, RECURRENT 11/20/2009    kimbrough   . OSTEOPOROSIS 01/01/2008  . SCOLIOSIS 05/25/2009  . FATIGUE 05/25/2009  . PALPITATIONS, OCCASIONAL 07/12/2008  . URINARY INCONTINENCE 07/07/2007  . ADVERSE REACTION TO MEDICATION 07/31/2009  . COLONIC POLYPS, ADENOMATOUS, HX OF   . NEPHROLITHIASIS, HX OF 11/15/2008  . PARATHYROIDECTOMY 01/02/2009  . Seasonal allergies   . Unspecified constipation   . Other acquired absence of organ     parathyroidectomy  . Fever, unspecified   . Abdominal pain, unspecified site   . Disturbance of skin  sensation   . Pain in limb   . Chest pain, unspecified   . Family history of diabetes mellitus   . Family history of malignant neoplasm of breast    Past Surgical History  Procedure Laterality Date  . Right oophorectomy    . Parathyroidectomy      Rt Superior open neck exploration   History   Social History  . Marital Status: Married    Spouse Name: N/A  . Number of Children: 5  . Years of Education: N/A   Occupational History  . travel agent    Social History Main Topics  . Smoking status: Never Smoker   . Smokeless tobacco: Never Used  . Alcohol Use: 3.0 oz/week    5 Glasses of wine per week     Comment: occ. wine  . Drug Use: No  . Sexual Activity: No   Other Topics Concern  . Not on file   Social History Narrative   HH of 5    Kids at home now   Married   travel agent.    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 Breast cancer Other     1st degree relative <50  .  Diabetes      1st degree relative  . Sudden death Father 5  . Heart disease Father 106    MI  . Heart disease Mother     valve replacement  . Colon cancer Neg Hx   . Stomach cancer Neg Hx    Patient Active Problem List   Diagnosis Date Noted  . Dizziness 04/25/2014  . Vertigo 12/26/2012  . Hypertension, uncontrolled 09/11/2012  . Diastolic dysfunction 09/32/6712  . Sleep disturbance 09/17/2011  . Hypotension 12/26/2010  . Hypotension, postural 12/26/2010  . UTI'S, RECURRENT 11/20/2009  . ADVERSE REACTION TO MEDICATION 07/31/2009  . SCOLIOSIS 05/25/2009  . FATIGUE 05/25/2009  . DIVERTICULOSIS, COLON 02/23/2009  . COLONIC POLYPS, ADENOMATOUS, HX OF 02/23/2009  . PARATHYROIDECTOMY 01/02/2009  . NEPHROLITHIASIS, HX OF 11/15/2008  . PALPITATIONS, OCCASIONAL 07/12/2008  . HYPERTENSION 02/04/2008  . CONSTIPATION, CHRONIC 01/01/2008  . LEG PAIN, LEFT 01/01/2008  . OSTEOPOROSIS 01/01/2008    . HYPERPARATHYROIDISM UNSPECIFIED 10/20/2007  . HYPERLIPIDEMIA 07/07/2007  . ANEMIA-NOS 07/07/2007  . URINARY INCONTINENCE 07/07/2007   Current Outpatient Prescriptions on File Prior to Visit  Medication Sig Dispense Refill  . aspirin 81 MG tablet Take 81 mg by mouth daily.      [name] atorvastatin (LIPITOR) 20 MG tablet TAKE 1 TABLET EACH DAY. 90 tablet 2  . cholecalciferol (VITAMIN D) 1000 UNITS tablet Take 1,000 Units by mouth daily.    . diazepam (VALIUM) 2 MG tablet Take 0.5-1 tablets (1-2 mg total) by mouth daily as needed. (Patient not taking: Reported on 01/05/2015) 30 tablet 1  . diclofenac (VOLTAREN) 75 MG EC tablet Take 75 mg by mouth as needed.     . diphenhydramine-acetaminophen (TYLENOL PM) 25-500 MG TABS Take 0.5 tablets by mouth at bedtime as needed.    . loratadine (CLARITIN) 10 MG tablet Take 10 mg by mouth daily.    [name] losartan (COZAAR) 100 MG tablet Take one -half tablet daily     No current facility-administered medications on file prior to visit.       Review of Systems See HPI    Objective:   Physical Exam Physical Exam  Nursing note and vitals reviewed.  Constitutional: She is oriented to person, place, and time. She appears well-developed and well-nourished.  HENT:  Head: Normocephalic and atraumatic.  Cardiovascular: Normal rate and regular rhythm. Exam reveals no gallop and no friction rub.  No murmur heard.  Pulmonary/Chest: Breath sounds normal. She has no wheezes. She has no rales.  Neurological: She is alert and oriented to person, place, and time.  ABD  No CVA tenderness Skin: Skin is warm and dry.  Psychiatric: She has a normal mood and affect. Her behavior is normal.              Assessment & Plan:  Osteoporosis:   Pt had both Prempro years ago and over 10 year of a bisphosphanate.  Likely a candidate for Prolia vs other injectables.  Will refer to rheumatology  Abnormal Urine pt asymptomatic  Will send for culture further management  based on results  Mixed urinary incontinence  She would like an inexpensive option  .  Will try ditropan but will not start until urine culture and treatment done  Pt voices understadning  H/O HyperPTH  S/P parathyroidectomy

## 2015-01-26 LAB — CBC
HCT: 40.8 % (ref 36.0–46.0)
Hemoglobin: 13.7 g/dL (ref 12.0–15.0)
MCH: 31.2 pg (ref 26.0–34.0)
MCHC: 33.6 g/dL (ref 30.0–36.0)
MCV: 92.9 fL (ref 78.0–100.0)
MPV: 9.8 fL (ref 8.6–12.4)
Platelets: 234 10*3/uL (ref 150–400)
RBC: 4.39 MIL/uL (ref 3.87–5.11)
RDW: 13.9 % (ref 11.5–15.5)
WBC: 4.6 10*3/uL (ref 4.0–10.5)

## 2015-01-26 LAB — VITAMIN D 25 HYDROXY (VIT D DEFICIENCY, FRACTURES): Vit D, 25-Hydroxy: 43 ng/mL (ref 30–100)

## 2015-01-26 NOTE — Progress Notes (Signed)
Mailed a copy of labs to patient-eh 

## 2015-01-29 LAB — CULTURE, URINE COMPREHENSIVE: Colony Count: >=100000

## 2015-01-31 ENCOUNTER — Encounter: Payer: Self-pay | Admitting: Physical Therapy

## 2015-01-31 ENCOUNTER — Other Ambulatory Visit: Payer: Self-pay | Admitting: Internal Medicine

## 2015-01-31 ENCOUNTER — Telehealth: Payer: Self-pay | Admitting: *Deleted

## 2015-01-31 ENCOUNTER — Ambulatory Visit: Payer: Medicare Other | Admitting: Physical Therapy

## 2015-01-31 DIAGNOSIS — R531 Weakness: Secondary | ICD-10-CM | POA: Diagnosis not present

## 2015-01-31 DIAGNOSIS — M810 Age-related osteoporosis without current pathological fracture: Secondary | ICD-10-CM | POA: Diagnosis not present

## 2015-01-31 DIAGNOSIS — I10 Essential (primary) hypertension: Secondary | ICD-10-CM | POA: Diagnosis not present

## 2015-01-31 DIAGNOSIS — R42 Dizziness and giddiness: Secondary | ICD-10-CM | POA: Diagnosis not present

## 2015-01-31 DIAGNOSIS — R293 Abnormal posture: Secondary | ICD-10-CM | POA: Diagnosis not present

## 2015-01-31 DIAGNOSIS — E785 Hyperlipidemia, unspecified: Secondary | ICD-10-CM | POA: Diagnosis not present

## 2015-01-31 MED ORDER — SULFAMETHOXAZOLE-TRIMETHOPRIM 800-160 MG PO TABS: ORAL_TABLET | ORAL | Status: DC

## 2015-01-31 NOTE — Therapy (Signed)
[facility] [address] [place], [place], [address] Phone: [phone]   Fax:  [phone]  Physical Therapy Treatment  Patient Details  Name: [name] MRN: [id number] Date of Birth: 06-10-1941 Referring Provider:  Lanice Shirts, *  Encounter Date: 01/31/2015      PT End of Session - 01/31/15 1219    Visit Number 2   Number of Visits 10  Medicare   Date for PT Re-Evaluation 03/01/15   PT Start Time 1062   PT Stop Time 1230   PT Time Calculation (min) 45 min   Activity Tolerance Patient tolerated treatment well   Behavior During Therapy [facility] for tasks assessed/performed      Past Medical History  Diagnosis Date  . HYPERPARATHYROIDISM UNSPECIFIED 10/20/2007  . HYPERLIPIDEMIA 07/07/2007  . ANEMIA-NOS 07/07/2007  . HYPERTENSION 02/04/2008  . DIVERTICULOSIS, COLON 02/23/2009  . UTI'S, RECURRENT 11/20/2009    kimbrough   . OSTEOPOROSIS 01/01/2008  . SCOLIOSIS 05/25/2009  . FATIGUE 05/25/2009  . PALPITATIONS, OCCASIONAL 07/12/2008  . URINARY INCONTINENCE 07/07/2007  . ADVERSE REACTION TO MEDICATION 07/31/2009  . COLONIC POLYPS, ADENOMATOUS, HX OF   . NEPHROLITHIASIS, HX OF 11/15/2008  . PARATHYROIDECTOMY 01/02/2009  . Seasonal allergies   . Unspecified constipation   . Other acquired absence of organ     parathyroidectomy  . Fever, unspecified   . Abdominal pain, unspecified site   . Disturbance of skin sensation   . Pain in limb   . Chest pain, unspecified   . Family history of diabetes mellitus   . Family history of malignant neoplasm of breast     Past Surgical History  Procedure Laterality Date  . Right oophorectomy    . Parathyroidectomy      Rt Superior open neck exploration    There were no vitals filed for this visit.  Visit Diagnosis:  Weakness      Subjective Assessment - 01/31/15 1156    Symptoms Saw Dr. Coralyn Mark  and have an infection in my urine.  T-score is -3.5 from last test  01/10/2015. T-score has declined.    Limitations Sitting   How long can you sit comfortably? 1 hour   Diagnostic tests Bone density exam done on 01/09/2015   Patient Stated Goals Improved balance, work on stairs, education on execises   Currently in Pain? Yes   Pain Score 6    Pain Location Back  right shoulder blade   Pain Orientation Mid;Right   Pain Descriptors / Indicators Aching;Constant   Pain Type Chronic pain   Pain Onset More than a month ago   Pain Frequency Constant   Aggravating Factors  sitting   Pain Relieving Factors advil   Multiple Pain Sites No                               PT Education - 01/31/15 1218    Education provided Yes   Education Details body mechanics with daily tasks, Decompression exercises   Person(s) Educated Patient   Methods Explanation;Demonstration;Handout   Comprehension Verbalized understanding;Returned demonstration          PT Short Term Goals - 01/31/15 1222    PT SHORT TERM GOAL #1   Title understand correct body mechanics with daily home tasks and correct posture   Time 3   Period Weeks   Status Achieved   PT SHORT TERM GOAL #2   Title  understand ways to avoid falls   Time 3   Period Weeks   Status Achieved           PT Long Term Goals - 01/31/15 1222    PT LONG TERM GOAL #1   Title independent with home exercise program and how to exercise at the gym with correct posture   Time 6   Period Weeks   Status On-going   PT LONG TERM GOAL #2   Title Berg balance score is 56/56   Time 6   Period Weeks   Status New   PT LONG TERM GOAL #3   Title perform daily activities with pain decreased >/= 50%   Time 6   Period Weeks   Status New               Plan - 01/31/15 1220    Clinical Impression Statement Patient has learned ways to decompress her spine and correct bodymechanics.    Pt will benefit from skilled therapeutic intervention in order to improve on the following deficits Impaired  flexibility;Improper body mechanics;Postural dysfunction;Decreased activity tolerance;Decreased knowledge of precautions;Pain;Increased muscle spasms;Decreased balance;Decreased mobility;Decreased strength   Rehab Potential Good   PT Frequency 2x / week   PT Duration 6 weeks   PT Treatment/Interventions Moist Heat;Therapeutic activities;Patient/family education;Passive range of motion;Therapeutic exercise;Ultrasound;Balance training;Manual techniques;Energy conservation;Cryotherapy;Electrical Stimulation;Functional mobility training   PT Next Visit Plan scapular strengthening, balance; pain relief   PT Home Exercise Plan balance exercises   Recommended Other Services None   Consulted and Agree with Plan of Care Patient        Problem List Patient Active Problem List   Diagnosis Date Noted  . Dizziness 04/25/2014  . Vertigo 12/26/2012  . Hypertension, uncontrolled 09/11/2012  . Diastolic dysfunction 36/62/9476  . Sleep disturbance 09/17/2011  . Hypotension 12/26/2010  . Hypotension, postural 12/26/2010  . UTI'S, RECURRENT 11/20/2009  . ADVERSE REACTION TO MEDICATION 07/31/2009  . SCOLIOSIS 05/25/2009  . FATIGUE 05/25/2009  . DIVERTICULOSIS, COLON 02/23/2009  . COLONIC POLYPS, ADENOMATOUS, HX OF 02/23/2009  . PARATHYROIDECTOMY 01/02/2009  . NEPHROLITHIASIS, HX OF 11/15/2008  . PALPITATIONS, OCCASIONAL 07/12/2008  . HYPERTENSION 02/04/2008  . CONSTIPATION, CHRONIC 01/01/2008  . LEG PAIN, LEFT 01/01/2008  . OSTEOPOROSIS 01/01/2008  . HYPERPARATHYROIDISM UNSPECIFIED 10/20/2007  . HYPERLIPIDEMIA 07/07/2007  . ANEMIA-NOS 07/07/2007  . URINARY INCONTINENCE 07/07/2007    Joelys Staubs, PT 01/31/2015, 12:27 PM  [facility] [address] [place], [place], [address] Phone: [phone]   Fax:  [phone]

## 2015-01-31 NOTE — Telephone Encounter (Signed)
Called pt adv UTI and script at pharmacy - pt expressed understanding.

## 2015-01-31 NOTE — Telephone Encounter (Signed)
-----   Message from Lanice Shirts, MD sent at 01/31/2015  7:49 AM EDT ----- Call pt and let her know that preliminary results of urine does show infection.  I do not have the organism back from lab as yet.  I want to place her on Bactrim Ds one tablet bid for 7 days.  I sent RX to pharmacy

## 2015-01-31 NOTE — Patient Instructions (Signed)
Sleeping on Back  Place pillow under knees. A pillow with cervical support and a roll around waist are also helpful. Copyright  VHI. All rights reserved.  Sleeping on Side Place pillow between knees. Use cervical support under neck and a roll around waist as needed. Copyright  VHI. All rights reserved.   Sleeping on Stomach   If this is the only desirable sleeping position, place pillow under lower legs, and under stomach or chest as needed.  Posture - Sitting   Sit upright, head facing forward. Try using a roll to support lower back. Keep shoulders relaxed, and avoid rounded back. Keep hips level with knees. Avoid crossing legs for long periods. Stand to Sit / Sit to Stand   To sit: Bend knees to lower self onto front edge of chair, then scoot back on seat. To stand: Reverse sequence by placing one foot forward, and scoot to front of seat. Use rocking motion to stand up.   Work Height and Reach  Ideal work height is no more than 2 to 4 inches below elbow level when standing, and at elbow level when sitting. Reaching should be limited to arm's length, with elbows slightly bent.  Bending  Bend at hips and knees, not back. Keep feet shoulder-width apart.    Posture - Standing   Good posture is important. Avoid slouching and forward head thrust. Maintain curve in low back and align ears over shoul- ders, hips over ankles.  Alternating Positions   Alternate tasks and change positions frequently to reduce fatigue and muscle tension. Take rest breaks. Computer Work   Position work to face forward. Use proper work and seat height. Keep shoulders back and down, wrists straight, and elbows at right angles. Use chair that provides full back support. Add footrest and lumbar roll as needed.  Getting Into / Out of Car  Lower self onto seat, scoot back, then bring in one leg at a time. Reverse sequence to get out.  Dressing  Lie on back to pull socks or slacks over feet, or sit  and bend leg while keeping back straight.    Housework - Sink  Place one foot on ledge of cabinet under sink when standing at sink for prolonged periods.   Pushing / Pulling  Pushing is preferable to pulling. Keep back in proper alignment, and use leg muscles to do the work.  Deep Squat   Squat and lift with both arms held against upper trunk. Tighten stomach muscles without holding breath. Use smooth movements to avoid jerking.  Avoid Twisting   Avoid twisting or bending back. Pivot around using foot movements, and bend at knees if needed when reaching for articles.  Carrying Luggage   Distribute weight evenly on both sides. Use a cart whenever possible. Do not twist trunk. Move body as a unit.   Lifting Principles .Maintain proper posture and head alignment. .Slide object as close as possible before lifting. .Move obstacles out of the way. .Test before lifting; ask for help if too heavy. .Tighten stomach muscles without holding breath. .Use smooth movements; do not jerk. .Use legs to do the work, and pivot with feet. .Distribute the work load symmetrically and close to the center of trunk. .Push instead of pull whenever possible.   Ask For Help   Ask for help and delegate to others when possible. Coordinate your movements when lifting together, and maintain the low back curve.  Log Roll   Lying on back, bend left knee and place left   arm across chest. Roll all in one movement to the right. Reverse to roll to the left. Always move as one unit. Housework - Sweeping  Use long-handled equipment to avoid stooping.   Housework - Wiping  Position yourself as close as possible to reach work surface. Avoid straining your back.  Laundry - Unloading Wash   To unload small items at bottom of washer, lift leg opposite to arm being used to reach.  [place] close to area to be raked. Use arm movements to do the work. Keep back straight and avoid  twisting.     Cart  When reaching into cart with one arm, lift opposite leg to keep back straight.   Getting Into / Out of Bed  Lower self to lie down on one side by raising legs and lowering head at the same time. Use arms to assist moving without twisting. Bend both knees to roll onto back if desired. To sit up, start from lying on side, and use same move-ments in reverse. Housework - Vacuuming  Hold the vacuum with arm held at side. Step back and forth to move it, keeping head up. Avoid twisting.   Laundry - IT consultant so that bending and twisting can be avoided.   Laundry - Unloading Dryer  Squat down to reach into clothes dryer or use a reacher.  Gardening - Weeding / Probation officer or Kneel. Knee pads may be helpful.                  RE-ALIGNMENT ROUTINE EXERCISES-OSTEOPROROSIS BASIC FOR POSTURAL CORRECTION   RE-ALIGNMENT Tips BENEFITS: 1.It helps to re-align the curves of the back and improve standing posture. 2.It allows the back muscles to rest and strengthen in preparation for more activity. FREQUENCY: Daily, even after weeks, months and years of more advanced exercises. START: 1.All exercises start in the same position: lying on the back, arms resting on the supporting surface, palms up and slightly away from the body, backs of hands down, knees bent, feet flat. 2.The head, neck, arms, and legs are supported according to specific instructions of your therapist. Copyright  VHI. All rights reserved.    1. Decompression Exercise: Basic.   Takes compression off the vertebral bodies; increases tolerance for lying on the back; helps relieve back pain   Lie on back on firm surface, knees bent, feet flat, arms turned up, out to sides (~35 degrees). Head neck and rams supported as necessary. Time _5-15__ minutes. Surface: floor     2. Shoulder Press  Strengthens upper back extensors and scapular retractors.   Press both  shoulders down. Hold _2-3__ seconds. Repeat _3-5__ times. Surface: floor        3. Head Press With [place]  Strengthens neck extensors   Tuck chin SLIGHTLY toward chest, keep mouth closed. Feel weight on back of head. Increase weight by pressing head down. Hold _2-3__ seconds. Relax. Repeat 3-5___ times. Surface: floor   4. Leg Lengthener: stretches quadratus lumborum and hip flexors.  Strengthens quads and ankle dorsiflexors.   Straighten one leg. Pull toes AND forefoot toward knee, extend heel. Lengthen leg by pulling pelvis away from ribs. Hold _2-3__ seconds. Relax. Repeat 1 time. Re-bend knee. Do other leg. Each leg 4-6___ times. Surface: floor  Copyright  VHI. All rights reserved.   5. Leg Press.   Strengthens gluteus maximus, lower erector spinae and ankle dorsiflexors.   Repeat the above motion (Leg lengthener)  and press entire leg downward.  Imagine making an impression of the leg in the sand.  Hold 2-3 seconds and relax.  Repeat on the other leg.  Each leg 4-6 times.

## 2015-02-02 ENCOUNTER — Ambulatory Visit: Payer: Medicare Other | Admitting: Physical Therapy

## 2015-02-02 ENCOUNTER — Encounter: Payer: Self-pay | Admitting: Physical Therapy

## 2015-02-02 DIAGNOSIS — M810 Age-related osteoporosis without current pathological fracture: Secondary | ICD-10-CM | POA: Diagnosis not present

## 2015-02-02 DIAGNOSIS — I10 Essential (primary) hypertension: Secondary | ICD-10-CM | POA: Diagnosis not present

## 2015-02-02 DIAGNOSIS — R293 Abnormal posture: Secondary | ICD-10-CM | POA: Diagnosis not present

## 2015-02-02 DIAGNOSIS — R42 Dizziness and giddiness: Secondary | ICD-10-CM | POA: Diagnosis not present

## 2015-02-02 DIAGNOSIS — R531 Weakness: Secondary | ICD-10-CM | POA: Diagnosis not present

## 2015-02-02 DIAGNOSIS — E785 Hyperlipidemia, unspecified: Secondary | ICD-10-CM | POA: Diagnosis not present

## 2015-02-02 NOTE — Therapy (Signed)
[facility] [address] [place], [place], [address] Phone: [phone]   Fax:  [phone]  Physical Therapy Treatment  Patient Details  Name: [name] MRN: [id number] Date of Birth: 06/05/1941 Referring Provider:  Lanice Shirts, *  Encounter Date: 02/02/2015      PT End of Session - 02/02/15 1227    Visit Number 3   Number of Visits 10  Medicare   Date for PT Re-Evaluation 03/01/15   PT Start Time 4403   PT Stop Time 1225   PT Time Calculation (min) 40 min   Activity Tolerance Patient tolerated treatment well   Behavior During Therapy [facility] for tasks assessed/performed      Past Medical History  Diagnosis Date  . HYPERPARATHYROIDISM UNSPECIFIED 10/20/2007  . HYPERLIPIDEMIA 07/07/2007  . ANEMIA-NOS 07/07/2007  . HYPERTENSION 02/04/2008  . DIVERTICULOSIS, COLON 02/23/2009  . UTI'S, RECURRENT 11/20/2009    kimbrough   . OSTEOPOROSIS 01/01/2008  . SCOLIOSIS 05/25/2009  . FATIGUE 05/25/2009  . PALPITATIONS, OCCASIONAL 07/12/2008  . URINARY INCONTINENCE 07/07/2007  . ADVERSE REACTION TO MEDICATION 07/31/2009  . COLONIC POLYPS, ADENOMATOUS, HX OF   . NEPHROLITHIASIS, HX OF 11/15/2008  . PARATHYROIDECTOMY 01/02/2009  . Seasonal allergies   . Unspecified constipation   . Other acquired absence of organ     parathyroidectomy  . Fever, unspecified   . Abdominal pain, unspecified site   . Disturbance of skin sensation   . Pain in limb   . Chest pain, unspecified   . Family history of diabetes mellitus   . Family history of malignant neoplasm of breast     Past Surgical History  Procedure Laterality Date  . Right oophorectomy    . Parathyroidectomy      Rt Superior open neck exploration    There were no vitals filed for this visit.  Visit Diagnosis:  Weakness      Subjective Assessment - 02/02/15 1158    Symptoms getting out of bed and car is easier. The decompression exercises help.     Limitations Sitting   How long can you sit comfortably? 1 hour   Diagnostic tests Bone density exam done on 01/09/2015   Patient Stated Goals Improved balance, work on stairs, education on execises   Currently in Pain? No/denies   Multiple Pain Sites No            [facility] PT Assessment - 02/02/15 0001    Posture/Postural Control   Posture Comments 60.75 inches after exercises                   [facility] Adult PT Treatment/Exercise - 02/02/15 0001    Knee/Hip Exercises: Aerobic   Stationary Bike level 2 x4.30 min  fatiqued after                PT Education - 02/02/15 1226    Education provided Yes   Education Details flexibility exericses, back strengthening, arm exercises   Person(s) Educated Patient   Methods Explanation;Demonstration;Handout;Tactile cues   Comprehension Verbalized understanding;Returned demonstration          PT Short Term Goals - 01/31/15 1222    PT SHORT TERM GOAL #1   Title understand correct body mechanics with daily home tasks and correct posture   Time 3   Period Weeks   Status Achieved   PT SHORT TERM GOAL #2   Title understand ways to avoid falls   Time 3   Period  Weeks   Status Achieved           PT Long Term Goals - 01/31/15 1222    PT LONG TERM GOAL #1   Title independent with home exercise program and how to exercise at the gym with correct posture   Time 6   Period Weeks   Status On-going   PT LONG TERM GOAL #2   Title Berg balance score is 56/56   Time 6   Period Weeks   Status New   PT LONG TERM GOAL #3   Title perform daily activities with pain decreased >/= 50%   Time 6   Period Weeks   Status New               Plan - 02/02/15 1228    Clinical Impression Statement Patient grew 1/2 inch with exercises due to lengthening her trunk   Pt will benefit from skilled therapeutic intervention in order to improve on the following deficits Impaired flexibility;Improper body mechanics;Postural  dysfunction;Decreased activity tolerance;Decreased knowledge of precautions;Pain;Increased muscle spasms;Decreased balance;Decreased mobility;Decreased strength   Rehab Potential Good   Clinical Impairments Affecting Rehab Potential None   PT Frequency 2x / week   PT Duration 6 weeks   PT Treatment/Interventions Moist Heat;Therapeutic activities;Patient/family education;Passive range of motion;Therapeutic exercise;Ultrasound;Balance training;Manual techniques;Energy conservation;Cryotherapy;Electrical Stimulation;Functional mobility training   PT Next Visit Plan trunk strengthening   PT Home Exercise Plan Review HEP   Recommended Other Services None   Consulted and Agree with Plan of Care Patient        Problem List Patient Active Problem List   Diagnosis Date Noted  . Dizziness 04/25/2014  . Vertigo 12/26/2012  . Hypertension, uncontrolled 09/11/2012  . Diastolic dysfunction 87/56/4332  . Sleep disturbance 09/17/2011  . Hypotension 12/26/2010  . Hypotension, postural 12/26/2010  . UTI'S, RECURRENT 11/20/2009  . ADVERSE REACTION TO MEDICATION 07/31/2009  . SCOLIOSIS 05/25/2009  . FATIGUE 05/25/2009  . DIVERTICULOSIS, COLON 02/23/2009  . COLONIC POLYPS, ADENOMATOUS, HX OF 02/23/2009  . PARATHYROIDECTOMY 01/02/2009  . NEPHROLITHIASIS, HX OF 11/15/2008  . PALPITATIONS, OCCASIONAL 07/12/2008  . HYPERTENSION 02/04/2008  . CONSTIPATION, CHRONIC 01/01/2008  . LEG PAIN, LEFT 01/01/2008  . OSTEOPOROSIS 01/01/2008  . HYPERPARATHYROIDISM UNSPECIFIED 10/20/2007  . HYPERLIPIDEMIA 07/07/2007  . ANEMIA-NOS 07/07/2007  . URINARY INCONTINENCE 07/07/2007    Nikolay Demetriou,PT 02/02/2015, 12:33 PM  [facility] [address] [place], [place], [address] Phone: [phone]   Fax:  [phone]

## 2015-02-02 NOTE — Patient Instructions (Addendum)
Calf Stretch   Place one leg forward, bent, other leg behind and straight. Hands on wallLean forward keeping back heel straight . Elbows bent. Push hip forward.  Hold _30___ seconds while counting out loud. Repeat with other leg forward. Repeat _2___ times. Do __1__ sessions per day.  http://gt2.exer.us/478   Copyright  VHI. All rights reserved.   Hamstring Step 1   Straighten left knee. Keep knee level with other knee or on bolster. Hold _30__ seconds. Relax knee by returning foot to start. Repeat __1_ times. 1 time per day. Both legs.   Copyright  VHI. All rights reserved.   Arm Lengthener: Single   Arms at sides, palms down. Lift one arm over head to alongside ear, keeping elbow straight. Lengthen arm by pulling ribs away from pelvis. Hold 3___ seconds. Relax. Repeat 1 time. Return arm to side. Repeat with other arm. 10 times each arm 1 time per day.   Copyright  VHI. All rights reserved.  Elbow Press   Interlace fingers; bring hands underneath head. Press elbows down. Press head into hands.  Hold _5__ seconds. Relax arms. Repeat _5__ times.  Copyright  VHI. All rights reserved.  Buttock Squeeze   On back, knees straight, legs together, not rotated outward. Squeezing buttocks together, say tight, tighter, tightest, or count 1, 2, 3. Hold _5__ seconds. Relax. Repeat _5__ times.  Copyright  VHI. All rights reserved.  [place] in [place]: Double Arm   Arms near sides, palms up. Press both arms lightly into floor, slide arms out to side and up alongside head. Keep contact with floor throughout motion. At maximal position, lengthen arms. Hold _1__ seconds. Relax. Slide arms back to start. Repeat __10_ times.  Copyright  VHI. All rights reserved.  BREATHING Tips A Paying attention to breathing is important in any exercise program. Doing breathing exercises is also important because the action of the breathing muscles at their bony attachments will help stimulate  stronger, healthier, denser bones. As you learn these breathing exercises, begin to practice them (and others that you may know) so that you spend several minutes on formal breathing exercises. Breathing exercises can be very relaxing and you can safely spend as much time as you would like doing them.   Copyright  VHI. All rights reserved.   Three Part Breath (Lying)   One hand on belly below navel, other hand on upper ribs. Breathe IN, expanding abdomen, lower and then upper ribs. Breathe OUT in opposite direction by pulling upper ribs then lower ribs, then abdomen inward. 10 times Copyright  VHI. All rights reserved.  Breath of Joy (Lying)   Hands across abdomen, elbows relaxed, one hand crossed over the other. Breathe IN, raising arms up and out to sides. Breathe OUT, lowering arms. Coordinate breath with arm movement. As you return to starting position, alternate which hand is on top. Repeat __10_ times.  Copyright  VHI. All rights reserved.  Patient able to return demonstration correctly with above exercises with minimal verbal cues.

## 2015-02-03 NOTE — Progress Notes (Signed)
Requested finial report for urine culture from lab -eh

## 2015-02-06 ENCOUNTER — Telehealth: Payer: Self-pay | Admitting: Internal Medicine

## 2015-02-06 MED ORDER — NITROFURANTOIN MONOHYD MACRO 100 MG PO CAPS: 100.0000 mg | ORAL_CAPSULE | Freq: Two times a day (BID) | ORAL | Status: DC

## 2015-02-06 NOTE — Telephone Encounter (Signed)
Left message on pt voicemail to call office regarding urine results

## 2015-02-06 NOTE — Telephone Encounter (Signed)
Spoke with pt and informed of E coli UTI that is resisitant to Sulfa and Cipro  Will give nitrofurantoin bid for 10 days.  Pt counseled to follow up with me after abx completion sometime in April

## 2015-02-07 ENCOUNTER — Encounter: Payer: Self-pay | Admitting: Physical Therapy

## 2015-02-07 ENCOUNTER — Ambulatory Visit: Payer: Medicare Other | Admitting: Physical Therapy

## 2015-02-07 DIAGNOSIS — M810 Age-related osteoporosis without current pathological fracture: Secondary | ICD-10-CM | POA: Diagnosis not present

## 2015-02-07 DIAGNOSIS — R42 Dizziness and giddiness: Secondary | ICD-10-CM | POA: Diagnosis not present

## 2015-02-07 DIAGNOSIS — E785 Hyperlipidemia, unspecified: Secondary | ICD-10-CM | POA: Diagnosis not present

## 2015-02-07 DIAGNOSIS — R531 Weakness: Secondary | ICD-10-CM

## 2015-02-07 DIAGNOSIS — I10 Essential (primary) hypertension: Secondary | ICD-10-CM | POA: Diagnosis not present

## 2015-02-07 DIAGNOSIS — R293 Abnormal posture: Secondary | ICD-10-CM | POA: Diagnosis not present

## 2015-02-07 NOTE — Therapy (Signed)
[facility] [address] [place], [place], [address] Phone: [phone]   Fax:  [phone]  Physical Therapy Treatment  Patient Details  Name: [name] MRN: [id number] Date of Birth: 03/25/1941 Referring Provider:  Lanice Shirts, *  Encounter Date: 02/07/2015      PT End of Session - 02/07/15 1249    Visit Number 4   Number of Visits 10  Medicare   Date for PT Re-Evaluation 03/01/15   PT Start Time 1230   PT Stop Time 1315   PT Time Calculation (min) 45 min   Activity Tolerance Patient tolerated treatment well   Behavior During Therapy [facility] for tasks assessed/performed      Past Medical History  Diagnosis Date  . HYPERPARATHYROIDISM UNSPECIFIED 10/20/2007  . HYPERLIPIDEMIA 07/07/2007  . ANEMIA-NOS 07/07/2007  . HYPERTENSION 02/04/2008  . DIVERTICULOSIS, COLON 02/23/2009  . UTI'S, RECURRENT 11/20/2009    kimbrough   . OSTEOPOROSIS 01/01/2008  . SCOLIOSIS 05/25/2009  . FATIGUE 05/25/2009  . PALPITATIONS, OCCASIONAL 07/12/2008  . URINARY INCONTINENCE 07/07/2007  . ADVERSE REACTION TO MEDICATION 07/31/2009  . COLONIC POLYPS, ADENOMATOUS, HX OF   . NEPHROLITHIASIS, HX OF 11/15/2008  . PARATHYROIDECTOMY 01/02/2009  . Seasonal allergies   . Unspecified constipation   . Other acquired absence of organ     parathyroidectomy  . Fever, unspecified   . Abdominal pain, unspecified site   . Disturbance of skin sensation   . Pain in limb   . Chest pain, unspecified   . Family history of diabetes mellitus   . Family history of malignant neoplasm of breast     Past Surgical History  Procedure Laterality Date  . Right oophorectomy    . Parathyroidectomy      Rt Superior open neck exploration    There were no vitals filed for this visit.  Visit Diagnosis:  Weakness      Subjective Assessment - 02/07/15 1242    Symptoms I am having touble with my balance.    Limitations Sitting   How long can you sit  comfortably? 1 hour   Diagnostic tests Bone density exam done on 01/09/2015   Patient Stated Goals Improved balance, work on stairs, education on execises   Currently in Pain? No/denies                       [facility] Adult PT Treatment/Exercise - 02/07/15 0001    Ambulation/Gait   Stairs Yes   Stairs Assistance 7: Independent   Stair Management Technique Two rails;Alternating pattern;Sideways  VC to look straight ahead   Number of Stairs 5   Height of Stairs 6   High Level Balance   High Level Balance Activities Tandem walking  had to hold on 5 times   Knee/Hip Exercises: Aerobic   Stationary Bike level 2 x 7 minutes  increased time by 2 min   Knee/Hip Exercises: Standing   Step Down Both;10 reps;Step Height: 6"  VC to look forward   Rocker Board Other (comment)  rock back and forth with finger tips 10x   Rocker Board Limitations hold rocker board  with eyes closed  20 sec 4 x  same with sideways   Knee/Hip Exercises: Seated   Other Seated Knee Exercises sit to stand 10 times with hands acrossed  chest                PT Education - 02/07/15 1311  Education provided Yes   Education Details Tandem walk, stair climbing, knee extension theraband   Person(s) Educated Patient   Methods Explanation;Handout   Comprehension Verbalized understanding;Returned demonstration          PT Short Term Goals - 02/07/15 1300    PT SHORT TERM GOAL #1   Title understand correct body mechanics with daily home tasks and correct posture   Time 3   Period Weeks   Status Achieved   PT SHORT TERM GOAL #2   Title understand ways to avoid falls   Time 3   Period Weeks   Status Achieved           PT Long Term Goals - 02/07/15 1301    PT LONG TERM GOAL #1   Title independent with home exercise program and how to exercise at the gym with correct posture   Time 6   Period Weeks   Status On-going  still learning more exercises   PT LONG TERM GOAL #2   Title Berg  balance score is 56/56   Period Weeks   Status New   PT LONG TERM GOAL #3   Title perform daily activities with pain decreased >/= 50%   Time 6   Period Weeks   Status On-going  back pain decreased >/= 30%               Plan - 02/07/15 1312    Clinical Impression Statement Patient is learning balance and knee strenthening.  Patient has more difficulty with eyes closed with her balance exercises.   Pt will benefit from skilled therapeutic intervention in order to improve on the following deficits Impaired flexibility;Improper body mechanics;Postural dysfunction;Decreased activity tolerance;Decreased knowledge of precautions;Pain;Increased muscle spasms;Decreased balance;Decreased mobility;Decreased strength   Rehab Potential Good   Clinical Impairments Affecting Rehab Potential None   PT Frequency 2x / week   PT Duration 6 weeks   PT Treatment/Interventions Moist Heat;Therapeutic activities;Patient/family education;Passive range of motion;Therapeutic exercise;Ultrasound;Balance training;Manual techniques;Energy conservation;Cryotherapy;Electrical Stimulation;Functional mobility training   PT Next Visit Plan Check berg balance   PT Home Exercise Plan Review HEP   Consulted and Agree with Plan of Care Patient        Problem List Patient Active Problem List   Diagnosis Date Noted  . Dizziness 04/25/2014  . Vertigo 12/26/2012  . Hypertension, uncontrolled 09/11/2012  . Diastolic dysfunction 02/54/2706  . Sleep disturbance 09/17/2011  . Hypotension 12/26/2010  . Hypotension, postural 12/26/2010  . UTI'S, RECURRENT 11/20/2009  . ADVERSE REACTION TO MEDICATION 07/31/2009  . SCOLIOSIS 05/25/2009  . FATIGUE 05/25/2009  . DIVERTICULOSIS, COLON 02/23/2009  . COLONIC POLYPS, ADENOMATOUS, HX OF 02/23/2009  . PARATHYROIDECTOMY 01/02/2009  . NEPHROLITHIASIS, HX OF 11/15/2008  . PALPITATIONS, OCCASIONAL 07/12/2008  . HYPERTENSION 02/04/2008  . CONSTIPATION, CHRONIC 01/01/2008   . LEG PAIN, LEFT 01/01/2008  . OSTEOPOROSIS 01/01/2008  . HYPERPARATHYROIDISM UNSPECIFIED 10/20/2007  . HYPERLIPIDEMIA 07/07/2007  . ANEMIA-NOS 07/07/2007  . URINARY INCONTINENCE 07/07/2007    GRAY,CHERYL,PT 02/07/2015, 1:14 PM  [facility] [address], [place], [place], [address] Phone: [phone]   Fax:  [phone]

## 2015-02-07 NOTE — Patient Instructions (Addendum)
Proprioception, Coordination, Quad Strength: Retro Step-Up   Step on backwards with one foot, then the other. Step off forward the same way. Use _6___ inch step. Repeat ___10_ times or for ____ minutes. Do _1___ sessions per day.  http://cc.exer.us/5   Copyright  VHI. All rights reserved.  Tandem Walking   Walk with each foot directly in front of other, heel of one foot touching toes of other foot with each step. Both feet straight ahead. Do along counter 4 times.   Copyright  VHI. All rights reserved.   Sit to Stand: Phase 3   Sitting, squeeze pelvic floor and hold. Lean trunk forward. With hands across chest. Then sit.   Repeat _10__ times. Do _1__ times a day.  Copyright  VHI. All rights reserved.  Knee Extension: Resisted (Sitting)   With band looped around right ankle and under other foot, straighten leg with ankle loop. Keep other leg bent to increase resistance. Repeat _30___ times per set. Do __1__ sets per session. Do __1__ sessions per day.  http://orth.exer.us/690   Copyright  VHI. All rights reserved.  Patient able to return demonstration correctly with above exercises.

## 2015-02-09 ENCOUNTER — Encounter: Payer: Self-pay | Admitting: Physical Therapy

## 2015-02-09 ENCOUNTER — Ambulatory Visit: Payer: Medicare Other | Admitting: Physical Therapy

## 2015-02-09 DIAGNOSIS — M810 Age-related osteoporosis without current pathological fracture: Secondary | ICD-10-CM | POA: Diagnosis not present

## 2015-02-09 DIAGNOSIS — R531 Weakness: Secondary | ICD-10-CM

## 2015-02-09 DIAGNOSIS — R293 Abnormal posture: Secondary | ICD-10-CM | POA: Diagnosis not present

## 2015-02-09 DIAGNOSIS — R42 Dizziness and giddiness: Secondary | ICD-10-CM | POA: Diagnosis not present

## 2015-02-09 DIAGNOSIS — I10 Essential (primary) hypertension: Secondary | ICD-10-CM | POA: Diagnosis not present

## 2015-02-09 DIAGNOSIS — E785 Hyperlipidemia, unspecified: Secondary | ICD-10-CM | POA: Diagnosis not present

## 2015-02-09 NOTE — Therapy (Signed)
[facility] [address] [place], [place], [address] Phone: [phone]   Fax:  [phone]  Physical Therapy Treatment  Patient Details  Name: [name] MRN: [id number] Date of Birth: [birth year] Referring Provider:  Lanice Shirts, *  Encounter Date: 02/09/2015      PT End of Session - 02/09/15 1225    Visit Number 5   Number of Visits 10  Medicare   Date for PT Re-Evaluation 03/01/15   PT Start Time 7017   PT Stop Time 1245   PT Time Calculation (min) 60 min   Activity Tolerance Patient limited by pain   Behavior During Therapy [facility] for tasks assessed/performed      Past Medical History  Diagnosis Date  . HYPERPARATHYROIDISM UNSPECIFIED 10/20/2007  . HYPERLIPIDEMIA 07/07/2007  . ANEMIA-NOS 07/07/2007  . HYPERTENSION 02/04/2008  . DIVERTICULOSIS, COLON 02/23/2009  . UTI'S, RECURRENT 11/20/2009    kimbrough   . OSTEOPOROSIS 01/01/2008  . SCOLIOSIS 05/25/2009  . FATIGUE 05/25/2009  . PALPITATIONS, OCCASIONAL 07/12/2008  . URINARY INCONTINENCE 07/07/2007  . ADVERSE REACTION TO MEDICATION 07/31/2009  . COLONIC POLYPS, ADENOMATOUS, HX OF   . NEPHROLITHIASIS, HX OF 11/15/2008  . PARATHYROIDECTOMY 01/02/2009  . Seasonal allergies   . Unspecified constipation   . Other acquired absence of organ     parathyroidectomy  . Fever, unspecified   . Abdominal pain, unspecified site   . Disturbance of skin sensation   . Pain in limb   . Chest pain, unspecified   . Family history of diabetes mellitus   . Family history of malignant neoplasm of breast     Past Surgical History  Procedure Laterality Date  . Right oophorectomy    . Parathyroidectomy      Rt Superior open neck exploration    There were no vitals filed for this visit.  Visit Diagnosis:  Weakness      Subjective Assessment - 02/09/15 1157    Symptoms I sat alot yesterday and have pain in right thoracic area.  Not a good day.  I have a 6 hour  drive tomorrow.    Limitations Sitting   How long can you sit comfortably? 1 hour   Diagnostic tests Bone density exam done on 01/09/2015   Patient Stated Goals Improved balance, work on stairs, education on execises   Currently in Pain? Yes   Pain Score 7    Pain Location Back   Pain Orientation Right;Mid   Pain Descriptors / Indicators Aching;Constant   Pain Type Chronic pain   Pain Onset More than a month ago   Pain Frequency Constant   Aggravating Factors  sitting   Pain Relieving Factors advil, stretch   Multiple Pain Sites No       Palpable tenderness located in thoracic, lumbar paraspinals. After therapy patient pain was abolished.                [facility] Adult PT Treatment/Exercise - 02/09/15 0001    Knee/Hip Exercises: Aerobic   Stationary Bike level 2 x 7 minutes  increased time by 2 min   Knee/Hip Exercises: Seated   Other Seated Knee Exercises sit to stand 10 times with hands acrossed  chest   Modalities   Modalities Electrical Stimulation;Moist Heat   Moist Heat Therapy   Number Minutes Moist Heat 20 Minutes   Moist Heat Location Other (comment)  back in supine   Electrical Stimulation   Electrical Stimulation Location back  Electrical Stimulation Action IFC   Electrical Stimulation Parameters 20 min   Electrical Stimulation Goals Pain   Manual Therapy   Manual Therapy Massage   Massage right thoracic and lumbar paraspinals and right gluteals in left sidely                PT Education - 02/09/15 1225    Education provided No          PT Short Term Goals - 02/07/15 1300    PT SHORT TERM GOAL #1   Title understand correct body mechanics with daily home tasks and correct posture   Time 3   Period Weeks   Status Achieved   PT SHORT TERM GOAL #2   Title understand ways to avoid falls   Time 3   Period Weeks   Status Achieved           PT Long Term Goals - 02/07/15 1301    PT LONG TERM GOAL #1   Title independent with home  exercise program and how to exercise at the gym with correct posture   Time 6   Period Weeks   Status On-going  still learning more exercises   PT LONG TERM GOAL #2   Title Berg balance score is 56/56   Period Weeks   Status New   PT LONG TERM GOAL #3   Title perform daily activities with pain decreased >/= 50%   Time 6   Period Weeks   Status On-going  back pain decreased >/= 30%               Plan - 02/09/15 1225    Clinical Impression Statement Patient had back pain making it difficult to exercise alot.    Pt will benefit from skilled therapeutic intervention in order to improve on the following deficits Impaired flexibility;Improper body mechanics;Postural dysfunction;Decreased activity tolerance;Decreased knowledge of precautions;Pain;Increased muscle spasms;Decreased balance;Decreased mobility;Decreased strength   Rehab Potential Good   Clinical Impairments Affecting Rehab Potential None   PT Frequency 2x / week   PT Duration 6 weeks   PT Treatment/Interventions Moist Heat;Therapeutic activities;Patient/family education;Passive range of motion;Therapeutic exercise;Ultrasound;Balance training;Manual techniques;Energy conservation;Cryotherapy;Electrical Stimulation;Functional mobility training   PT Next Visit Plan check Berg balance   PT Home Exercise Plan Review HEP   Consulted and Agree with Plan of Care Patient        Problem List Patient Active Problem List   Diagnosis Date Noted  . Dizziness 04/25/2014  . Vertigo 12/26/2012  . Hypertension, uncontrolled 09/11/2012  . Diastolic dysfunction 22/29/7989  . Sleep disturbance 09/17/2011  . Hypotension 12/26/2010  . Hypotension, postural 12/26/2010  . UTI'S, RECURRENT 11/20/2009  . ADVERSE REACTION TO MEDICATION 07/31/2009  . SCOLIOSIS 05/25/2009  . FATIGUE 05/25/2009  . DIVERTICULOSIS, COLON 02/23/2009  . COLONIC POLYPS, ADENOMATOUS, HX OF 02/23/2009  . PARATHYROIDECTOMY 01/02/2009  . NEPHROLITHIASIS, HX  OF 11/15/2008  . PALPITATIONS, OCCASIONAL 07/12/2008  . HYPERTENSION 02/04/2008  . CONSTIPATION, CHRONIC 01/01/2008  . LEG PAIN, LEFT 01/01/2008  . OSTEOPOROSIS 01/01/2008  . HYPERPARATHYROIDISM UNSPECIFIED 10/20/2007  . HYPERLIPIDEMIA 07/07/2007  . ANEMIA-NOS 07/07/2007  . URINARY INCONTINENCE 07/07/2007    Kumari Sculley,PT 02/09/2015, 12:28 PM  [facility] [address] [place], [place], [address] Phone: [phone]   Fax:  [phone]

## 2015-02-13 ENCOUNTER — Encounter: Payer: No Typology Code available for payment source | Admitting: Physical Therapy

## 2015-02-15 ENCOUNTER — Encounter: Payer: Self-pay | Admitting: Physical Therapy

## 2015-02-15 ENCOUNTER — Ambulatory Visit: Payer: Medicare Other | Admitting: Physical Therapy

## 2015-02-15 DIAGNOSIS — E785 Hyperlipidemia, unspecified: Secondary | ICD-10-CM | POA: Diagnosis not present

## 2015-02-15 DIAGNOSIS — I10 Essential (primary) hypertension: Secondary | ICD-10-CM | POA: Diagnosis not present

## 2015-02-15 DIAGNOSIS — R2689 Other abnormalities of gait and mobility: Secondary | ICD-10-CM

## 2015-02-15 DIAGNOSIS — R42 Dizziness and giddiness: Secondary | ICD-10-CM | POA: Diagnosis not present

## 2015-02-15 DIAGNOSIS — R531 Weakness: Secondary | ICD-10-CM | POA: Diagnosis not present

## 2015-02-15 DIAGNOSIS — R293 Abnormal posture: Secondary | ICD-10-CM | POA: Diagnosis not present

## 2015-02-15 DIAGNOSIS — M810 Age-related osteoporosis without current pathological fracture: Secondary | ICD-10-CM | POA: Diagnosis not present

## 2015-02-15 NOTE — Therapy (Signed)
[facility] [address] [place], [place], [address] Phone: [phone]   Fax:  [phone]  Physical Therapy Treatment  Patient Details  Name: [name] MRN: [id number] Date of Birth: 1941-02-18 Referring Provider:  Lanice Shirts, *  Encounter Date: 02/15/2015      PT End of Session - 02/15/15 1146    Visit Number 6   Number of Visits 10   Date for PT Re-Evaluation 03/01/15   PT Start Time 1107   PT Stop Time 1147   PT Time Calculation (min) 40 min   Activity Tolerance Patient tolerated treatment well   Behavior During Therapy [facility] for tasks assessed/performed      Past Medical History  Diagnosis Date  . HYPERPARATHYROIDISM UNSPECIFIED 10/20/2007  . HYPERLIPIDEMIA 07/07/2007  . ANEMIA-NOS 07/07/2007  . HYPERTENSION 02/04/2008  . DIVERTICULOSIS, COLON 02/23/2009  . UTI'S, RECURRENT 11/20/2009    kimbrough   . OSTEOPOROSIS 01/01/2008  . SCOLIOSIS 05/25/2009  . FATIGUE 05/25/2009  . PALPITATIONS, OCCASIONAL 07/12/2008  . URINARY INCONTINENCE 07/07/2007  . ADVERSE REACTION TO MEDICATION 07/31/2009  . COLONIC POLYPS, ADENOMATOUS, HX OF   . NEPHROLITHIASIS, HX OF 11/15/2008  . PARATHYROIDECTOMY 01/02/2009  . Seasonal allergies   . Unspecified constipation   . Other acquired absence of organ     parathyroidectomy  . Fever, unspecified   . Abdominal pain, unspecified site   . Disturbance of skin sensation   . Pain in limb   . Chest pain, unspecified   . Family history of diabetes mellitus   . Family history of malignant neoplasm of breast     Past Surgical History  Procedure Laterality Date  . Right oophorectomy    . Parathyroidectomy      Rt Superior open neck exploration    There were no vitals filed for this visit.  Visit Diagnosis:  Weakness  Balance disorder      Subjective Assessment - 02/15/15 1110    Symptoms Did well on my trip. Patient reports feeling low energy today.   Currently  in Pain? No/denies   Multiple Pain Sites No                       [facility] Adult PT Treatment/Exercise - 02/15/15 0001    Berg Balance Test   Sit to Stand Able to stand without using hands and stabilize independently   Standing Unsupported Able to stand safely 2 minutes   Sitting with Back Unsupported but Feet Supported on Floor or Stool Able to sit safely and securely 2 minutes   Stand to Sit Sits safely with minimal use of hands   Transfers Able to transfer safely, minor use of hands   Standing Unsupported with Eyes Closed Able to stand 10 seconds safely   Standing Ubsupported with Feet Together Able to place feet together independently and stand 1 minute safely   From Standing, Reach Forward with Outstretched Arm Can reach confidently >25 cm (10")   From Standing Position, Pick up Object from Floor Able to pick up shoe safely and easily   From Standing Position, Turn to Look Behind Over each Shoulder Looks behind one side only/other side shows less weight shift   Turn 360 Degrees Able to turn 360 degrees safely one side only in 4 seconds or less   Standing Unsupported, Alternately Place Feet on Step/Stool Able to stand independently and safely and complete 8 steps in 20 seconds   Standing Unsupported,  One Foot in [place] to plae foot ahead of the other independently and hold 30 seconds   Standing on One Leg Able to lift leg independently and hold > 10 seconds   Total Score 53   Knee/Hip Exercises: Aerobic   Stationary Bike L2 x 10 min   Knee/Hip Exercises: Seated   Long Arc Quad 1 set;5 reps   Long Arc Quad Weight --  Review with red band                  PT Short Term Goals - 02/15/15 1112    PT SHORT TERM GOAL #1   Title understand correct body mechanics with daily home tasks and correct posture   Time 3   Period Weeks   Status Achieved   PT SHORT TERM GOAL #2   Title understand ways to avoid falls   Time 3   Period Weeks   Status Achieved            PT Long Term Goals - 02/15/15 1112    PT LONG TERM GOAL #1   Title independent with home exercise program and how to exercise at the gym with correct posture   Time 6   Period Weeks   Status On-going   PT LONG TERM GOAL #2   Title Berg balance score is 56/56   Time 6   Period Weeks   Status On-going  53/56 today   PT LONG TERM GOAL #3   Title perform daily activities with pain decreased >/= 50%   Time 6   Period Weeks   Status Achieved  50% improved                Plan - 02/15/15 1147    Clinical Impression Statement Increased BERG balance by 3 pts today.    Pt will benefit from skilled therapeutic intervention in order to improve on the following deficits Impaired flexibility;Improper body mechanics;Postural dysfunction;Decreased activity tolerance;Decreased knowledge of precautions;Pain;Increased muscle spasms;Decreased balance;Decreased mobility;Decreased strength   Rehab Potential Good   Clinical Impairments Affecting Rehab Potential None   PT Frequency 2x / week   PT Duration 6 weeks   PT Treatment/Interventions Moist Heat;Therapeutic activities;Patient/family education;Passive range of motion;Therapeutic exercise;Ultrasound;Balance training;Manual techniques;Energy conservation;Cryotherapy;Electrical Stimulation;Functional mobility training   PT Next Visit Plan HEP   Consulted and Agree with Plan of Care Patient        Problem List Patient Active Problem List   Diagnosis Date Noted  . Dizziness 04/25/2014  . Vertigo 12/26/2012  . Hypertension, uncontrolled 09/11/2012  . Diastolic dysfunction 67/89/3810  . Sleep disturbance 09/17/2011  . Hypotension 12/26/2010  . Hypotension, postural 12/26/2010  . UTI'S, RECURRENT 11/20/2009  . ADVERSE REACTION TO MEDICATION 07/31/2009  . SCOLIOSIS 05/25/2009  . FATIGUE 05/25/2009  . DIVERTICULOSIS, COLON 02/23/2009  . COLONIC POLYPS, ADENOMATOUS, HX OF 02/23/2009  . PARATHYROIDECTOMY 01/02/2009  .  NEPHROLITHIASIS, HX OF 11/15/2008  . PALPITATIONS, OCCASIONAL 07/12/2008  . HYPERTENSION 02/04/2008  . CONSTIPATION, CHRONIC 01/01/2008  . LEG PAIN, LEFT 01/01/2008  . OSTEOPOROSIS 01/01/2008  . HYPERPARATHYROIDISM UNSPECIFIED 10/20/2007  . HYPERLIPIDEMIA 07/07/2007  . ANEMIA-NOS 07/07/2007  . URINARY INCONTINENCE 07/07/2007    Cotey Rakes, PTA 02/15/2015, 11:49 AM  [facility] [address] [place], [place], [address] Phone: [phone]   Fax:  [phone]

## 2015-02-17 ENCOUNTER — Ambulatory Visit: Payer: Medicare Other | Attending: Internal Medicine | Admitting: Physical Therapy

## 2015-02-17 ENCOUNTER — Encounter: Payer: Self-pay | Admitting: Physical Therapy

## 2015-02-17 DIAGNOSIS — I10 Essential (primary) hypertension: Secondary | ICD-10-CM | POA: Insufficient documentation

## 2015-02-17 DIAGNOSIS — R293 Abnormal posture: Secondary | ICD-10-CM | POA: Diagnosis not present

## 2015-02-17 DIAGNOSIS — R531 Weakness: Secondary | ICD-10-CM | POA: Diagnosis not present

## 2015-02-17 DIAGNOSIS — R2689 Other abnormalities of gait and mobility: Secondary | ICD-10-CM

## 2015-02-17 DIAGNOSIS — E785 Hyperlipidemia, unspecified: Secondary | ICD-10-CM | POA: Insufficient documentation

## 2015-02-17 DIAGNOSIS — R42 Dizziness and giddiness: Secondary | ICD-10-CM | POA: Insufficient documentation

## 2015-02-17 DIAGNOSIS — M810 Age-related osteoporosis without current pathological fracture: Secondary | ICD-10-CM | POA: Diagnosis not present

## 2015-02-17 NOTE — Patient Instructions (Signed)
PNF Strengthening: Resisted   Standing with resistive band around each hand, bring right arm up and away, thumb back, move bottom arm down. Repeat _10___ times per set. Do _1-2___ sets per session. Do _1-2___ sessions per day. For all exercises, pull belly button in, away from pants, and UP towards the ribs.      Resisted Horizontal Abduction: Bilateral   Sit or stand, tubing in both hands, arms out in front. Keeping arms straight, pinch shoulder blades together and stretch arms out. Repeat _10___ times per set. Do 2____ sets per session. Do _1-2___ sessions per day.                  Scapular Retraction: Elbow Flexion (Standing)   With elbows bent to 90, pinch shoulder blades together and rotate arms out, keeping elbows bent. Repeat _10___ times per set. Do _1___ sets per session. Do many____ sessions per day.    Knee High   Holding stable object, raise knee to hip level, then lower knee. Repeat with other knee. STand tall, not like this picture:) Hold abdominals in always. Complete __10_ repetitions. Do __2__ sessions per day.  ABDUCTION: Standing (Active)   Stand, feet flat. Lift right leg out to side. Use _0__ lbs. Complete __10_ repetitions. Perform __2_ sessions per day.           EXTENSION: Standing (Active)  Stand, both feet flat. Draw right leg behind body as far as possible. Use 0___ lbs. Complete 5-10 repetitions. Perform __2_ sessions per day. Start with a "tap" behind you, work towards the lift.   Copyright  VHI. All rights reserved.   Body-Weight Step-Up: Stable (Active)   Head up, back flat, place one foot on step. Bring other leg up on to step. Complete ___ sets of ___ repetitions. Perform ___ sessions per day.  http://gtsc.exer.us/512   Copyright  VHI. All rights reserved.  http://orth.exer.us/734   Copyright  VHI. All rights reserved.  Strengthening: Wall Slide WALKING  Walking is a great form of exercise to  increase your strength, endurance and overall fitness.  A walking program can help you start slowly and gradually build endurance as you go.  Everyone's ability is different, so each person's starting point will be different.  You do not have to follow them exactly.  The are just samples. You should simply find out what's right for you and stick to that program.   In the beginning, you'll start off walking 2-3 times a day for short distances.  As you get stronger, you'll be walking further at just 1-2 times per day.  A. You Can Walk For A Certain Length Of Time Each Day    Walk 5 minutes 3 times per day.  Increase 2 minutes every 2 days (3 times per day).  Work up to 25-30 minutes (1-2 times per day).   Example:   Day 1-2 5 minutes 3 times per day   Day 7-8 12 minutes 2-3 times per day   Day 13-14 25 minutes 1-2 times per day  B. You Can Walk For a Certain Distance Each Day     Distance can be substituted for time.    Example:   3 trips to mailbox (at road)   3 trips to corner of block   3 trips around the block  C. Go to local [employer] and use the track.    Walk for distance ____ around track  Or time ____ minutes  D. Walk ____ Jog ____  Run ___  Please only do the exercises that your therapist has initialed and dated

## 2015-02-17 NOTE — Therapy (Signed)
[facility] [address], [place], [place], [address] Phone: [phone]   Fax:  [phone]  Physical Therapy Treatment  Patient Details  Name: [name] MRN: [id number] Date of Birth: 1941-05-22 Referring Provider:  Lanice Shirts, *  Encounter Date: 02/17/2015      PT End of Session - 02/17/15 0948    Visit Number 7   Number of Visits 10   Date for PT Re-Evaluation 03/01/15   PT Start Time 0909   PT Stop Time 0947   PT Time Calculation (min) 38 min   Activity Tolerance Patient tolerated treatment well   Behavior During Therapy [facility] for tasks assessed/performed      Past Medical History  Diagnosis Date  . HYPERPARATHYROIDISM UNSPECIFIED 10/20/2007  . HYPERLIPIDEMIA 07/07/2007  . ANEMIA-NOS 07/07/2007  . HYPERTENSION 02/04/2008  . DIVERTICULOSIS, COLON 02/23/2009  . UTI'S, RECURRENT 11/20/2009    kimbrough   . OSTEOPOROSIS 01/01/2008  . SCOLIOSIS 05/25/2009  . FATIGUE 05/25/2009  . PALPITATIONS, OCCASIONAL 07/12/2008  . URINARY INCONTINENCE 07/07/2007  . ADVERSE REACTION TO MEDICATION 07/31/2009  . COLONIC POLYPS, ADENOMATOUS, HX OF   . NEPHROLITHIASIS, HX OF 11/15/2008  . PARATHYROIDECTOMY 01/02/2009  . Seasonal allergies   . Unspecified constipation   . Other acquired absence of organ     parathyroidectomy  . Fever, unspecified   . Abdominal pain, unspecified site   . Disturbance of skin sensation   . Pain in limb   . Chest pain, unspecified   . Family history of diabetes mellitus   . Family history of malignant neoplasm of breast     Past Surgical History  Procedure Laterality Date  . Right oophorectomy    . Parathyroidectomy      Rt Superior open neck exploration    There were no vitals filed for this visit.  Visit Diagnosis:  Weakness  Balance disorder      Subjective Assessment - 02/17/15 0908    Symptoms No pain, no complaints this AM.   Currently in Pain? No/denies   Multiple  Pain Sites No                       [facility] Adult PT Treatment/Exercise - 02/17/15 0001    Knee/Hip Exercises: Aerobic   Stationary Bike L1 x 10 min                PT Education - 02/17/15 0909    Education provided Yes   Education Details HEP   Person(s) Educated Patient   Methods Explanation;Demonstration;Handout   Comprehension Verbalized understanding;Returned demonstration          PT Short Term Goals - 02/15/15 1112    PT SHORT TERM GOAL #1   Title understand correct body mechanics with daily home tasks and correct posture   Time 3   Period Weeks   Status Achieved   PT SHORT TERM GOAL #2   Title understand ways to avoid falls   Time 3   Period Weeks   Status Achieved           PT Long Term Goals - 02/15/15 1112    PT LONG TERM GOAL #1   Title independent with home exercise program and how to exercise at the gym with correct posture   Time 6   Period Weeks   Status On-going   PT LONG TERM GOAL #2   Title Berg balance score is 56/56   Time  6   Period Weeks   Status On-going  53/56 today   PT LONG TERM GOAL #3   Title perform daily activities with pain decreased >/= 50%   Time 6   Period Weeks   Status Achieved  50% improved                Plan - 02/17/15 0949    Clinical Impression Statement Issued HEP today for trunk, hip, knee strength as well as discussion on walking program. Patient had no complaints of pain at any time.   Pt will benefit from skilled therapeutic intervention in order to improve on the following deficits Impaired flexibility;Improper body mechanics;Postural dysfunction;Decreased activity tolerance;Decreased knowledge of precautions;Pain;Increased muscle spasms;Decreased balance;Decreased mobility;Decreased strength   Clinical Impairments Affecting Rehab Potential None   PT Frequency 2x / week   PT Duration 6 weeks   PT Treatment/Interventions Moist Heat;Therapeutic activities;Patient/family  education;Passive range of motion;Therapeutic exercise;Ultrasound;Balance training;Manual techniques;Energy conservation;Cryotherapy;Electrical Stimulation;Functional mobility training   PT Next Visit Plan Review HEP   Consulted and Agree with Plan of Care Patient        Problem List Patient Active Problem List   Diagnosis Date Noted  . Dizziness 04/25/2014  . Vertigo 12/26/2012  . Hypertension, uncontrolled 09/11/2012  . Diastolic dysfunction 54/98/2641  . Sleep disturbance 09/17/2011  . Hypotension 12/26/2010  . Hypotension, postural 12/26/2010  . UTI'S, RECURRENT 11/20/2009  . ADVERSE REACTION TO MEDICATION 07/31/2009  . SCOLIOSIS 05/25/2009  . FATIGUE 05/25/2009  . DIVERTICULOSIS, COLON 02/23/2009  . COLONIC POLYPS, ADENOMATOUS, HX OF 02/23/2009  . PARATHYROIDECTOMY 01/02/2009  . NEPHROLITHIASIS, HX OF 11/15/2008  . PALPITATIONS, OCCASIONAL 07/12/2008  . HYPERTENSION 02/04/2008  . CONSTIPATION, CHRONIC 01/01/2008  . LEG PAIN, LEFT 01/01/2008  . OSTEOPOROSIS 01/01/2008  . HYPERPARATHYROIDISM UNSPECIFIED 10/20/2007  . HYPERLIPIDEMIA 07/07/2007  . ANEMIA-NOS 07/07/2007  . URINARY INCONTINENCE 07/07/2007    Nathen Balaban, PTA 02/17/2015, 9:51 AM  [facility] [address] [place], [place], [address] Phone: [phone]   Fax:  [phone]

## 2015-02-20 ENCOUNTER — Ambulatory Visit: Payer: Medicare Other | Admitting: Physical Therapy

## 2015-02-20 ENCOUNTER — Encounter: Payer: Self-pay | Admitting: Physical Therapy

## 2015-02-20 DIAGNOSIS — M810 Age-related osteoporosis without current pathological fracture: Secondary | ICD-10-CM | POA: Diagnosis not present

## 2015-02-20 DIAGNOSIS — R293 Abnormal posture: Secondary | ICD-10-CM | POA: Diagnosis not present

## 2015-02-20 DIAGNOSIS — I10 Essential (primary) hypertension: Secondary | ICD-10-CM | POA: Diagnosis not present

## 2015-02-20 DIAGNOSIS — R2689 Other abnormalities of gait and mobility: Secondary | ICD-10-CM

## 2015-02-20 DIAGNOSIS — R531 Weakness: Secondary | ICD-10-CM | POA: Diagnosis not present

## 2015-02-20 DIAGNOSIS — E785 Hyperlipidemia, unspecified: Secondary | ICD-10-CM | POA: Diagnosis not present

## 2015-02-20 DIAGNOSIS — R42 Dizziness and giddiness: Secondary | ICD-10-CM | POA: Diagnosis not present

## 2015-02-20 NOTE — Patient Instructions (Signed)
Side Stretch, Standing Bend   Stand, one arm straight over head. Place other hand on countertop  And lift up and side bend to that side as far as is comfortable. Hold __2_ seconds. Repeat _3__ times per session. Do __1-2_ sessions per day. Try to stand straight more often.   Copyright  VHI. All rights reserved.

## 2015-02-20 NOTE — Therapy (Signed)
[facility] [address] [place], [place], [address] Phone: [phone]   Fax:  [phone]  Physical Therapy Treatment  Patient Details  Name: [name] MRN: [id number] Date of Birth: 09/28/1941 Referring Provider:  Lanice Shirts, *  Encounter Date: 02/20/2015      PT End of Session - 02/20/15 1144    Visit Number 8   Number of Visits 10   Date for PT Re-Evaluation 03/01/15   PT Start Time 1102   PT Stop Time 1145   PT Time Calculation (min) 43 min   Activity Tolerance Patient tolerated treatment well   Behavior During Therapy [facility] for tasks assessed/performed      Past Medical History  Diagnosis Date  . HYPERPARATHYROIDISM UNSPECIFIED 10/20/2007  . HYPERLIPIDEMIA 07/07/2007  . ANEMIA-NOS 07/07/2007  . HYPERTENSION 02/04/2008  . DIVERTICULOSIS, COLON 02/23/2009  . UTI'S, RECURRENT 11/20/2009    kimbrough   . OSTEOPOROSIS 01/01/2008  . SCOLIOSIS 05/25/2009  . FATIGUE 05/25/2009  . PALPITATIONS, OCCASIONAL 07/12/2008  . URINARY INCONTINENCE 07/07/2007  . ADVERSE REACTION TO MEDICATION 07/31/2009  . COLONIC POLYPS, ADENOMATOUS, HX OF   . NEPHROLITHIASIS, HX OF 11/15/2008  . PARATHYROIDECTOMY 01/02/2009  . Seasonal allergies   . Unspecified constipation   . Other acquired absence of organ     parathyroidectomy  . Fever, unspecified   . Abdominal pain, unspecified site   . Disturbance of skin sensation   . Pain in limb   . Chest pain, unspecified   . Family history of diabetes mellitus   . Family history of malignant neoplasm of breast     Past Surgical History  Procedure Laterality Date  . Right oophorectomy    . Parathyroidectomy      Rt Superior open neck exploration    There were no vitals filed for this visit.  Visit Diagnosis:  Weakness  Balance disorder      Subjective Assessment - 02/20/15 1106    Subjective Going to rheumatologist today. Unsure why?? HAd right scapular pain over  weekend.   Currently in Pain? No/denies  Took Advil this morning and no pain since.    Multiple Pain Sites No                       [facility] Adult PT Treatment/Exercise - 02/20/15 0001    High Level Balance   High Level Balance Activities --  Tandem walking with stops in tandem stance 10 sec.   Lumbar Exercises: Machines for Strengthening   Stationary Bike L2 x 10    Lumbar Exercises: Supine   Other Supine Lumbar Exercises Red band horizontal abdu 2x 10  VC on cervical posture, diagonals 10x each side                PT Education - 02/20/15 1133    Education provided Yes   Education Details HEP, LT side bend stretch in standing.    Person(s) Educated Patient   Methods Explanation;Demonstration   Comprehension Verbalized understanding;Returned demonstration          PT Short Term Goals - 02/20/15 1119    PT SHORT TERM GOAL #1   Title understand correct body mechanics with daily home tasks and correct posture   Time 3   Period Weeks   Status Achieved   PT SHORT TERM GOAL #2   Title understand ways to avoid falls   Time 3   Period Weeks   Status Achieved  PT Long Term Goals - 02/20/15 1120    PT LONG TERM GOAL #1   Title independent with home exercise program and how to exercise at the gym with correct posture   Time 6   Period Weeks   Status On-going   PT LONG TERM GOAL #2   Title Berg balance score is 56/56   Time 6   Period Weeks   Status On-going   PT LONG TERM GOAL #3   Title perform daily activities with pain decreased >/= 50%   Status Achieved               Plan - 02/20/15 1145    Clinical Impression Statement Has not walked yet for exercise, worked on midline posture during exercises today as pt prefers to lean right.   Pt will benefit from skilled therapeutic intervention in order to improve on the following deficits Impaired flexibility;Improper body mechanics;Postural dysfunction;Decreased activity  tolerance;Decreased knowledge of precautions;Pain;Increased muscle spasms;Decreased balance;Decreased mobility;Decreased strength   Rehab Potential Good   Clinical Impairments Affecting Rehab Potential None   PT Frequency 2x / week   PT Duration 6 weeks   PT Treatment/Interventions Moist Heat;Therapeutic activities;Patient/family education;Passive range of motion;Therapeutic exercise;Ultrasound;Balance training;Manual techniques;Energy conservation;Cryotherapy;Electrical Stimulation;Functional mobility training   PT Next Visit Plan Review HEP   Consulted and Agree with Plan of Care Patient        Problem List Patient Active Problem List   Diagnosis Date Noted  . Dizziness 04/25/2014  . Vertigo 12/26/2012  . Hypertension, uncontrolled 09/11/2012  . Diastolic dysfunction 38/88/7579  . Sleep disturbance 09/17/2011  . Hypotension 12/26/2010  . Hypotension, postural 12/26/2010  . UTI'S, RECURRENT 11/20/2009  . ADVERSE REACTION TO MEDICATION 07/31/2009  . SCOLIOSIS 05/25/2009  . FATIGUE 05/25/2009  . DIVERTICULOSIS, COLON 02/23/2009  . COLONIC POLYPS, ADENOMATOUS, HX OF 02/23/2009  . PARATHYROIDECTOMY 01/02/2009  . NEPHROLITHIASIS, HX OF 11/15/2008  . PALPITATIONS, OCCASIONAL 07/12/2008  . HYPERTENSION 02/04/2008  . CONSTIPATION, CHRONIC 01/01/2008  . LEG PAIN, LEFT 01/01/2008  . OSTEOPOROSIS 01/01/2008  . HYPERPARATHYROIDISM UNSPECIFIED 10/20/2007  . HYPERLIPIDEMIA 07/07/2007  . ANEMIA-NOS 07/07/2007  . URINARY INCONTINENCE 07/07/2007    Tonita Bills , PTA  02/20/2015, 11:48 AM  [facility] [address], [place], [place], [address] Phone: [phone]   Fax:  [phone]

## 2015-02-21 DIAGNOSIS — E213 Hyperparathyroidism, unspecified: Secondary | ICD-10-CM | POA: Diagnosis not present

## 2015-02-21 DIAGNOSIS — M810 Age-related osteoporosis without current pathological fracture: Secondary | ICD-10-CM | POA: Diagnosis not present

## 2015-02-21 DIAGNOSIS — M7989 Other specified soft tissue disorders: Secondary | ICD-10-CM | POA: Diagnosis not present

## 2015-02-22 ENCOUNTER — Ambulatory Visit: Payer: Medicare Other | Admitting: Physical Therapy

## 2015-02-22 ENCOUNTER — Encounter: Payer: Self-pay | Admitting: Physical Therapy

## 2015-02-22 DIAGNOSIS — I10 Essential (primary) hypertension: Secondary | ICD-10-CM | POA: Diagnosis not present

## 2015-02-22 DIAGNOSIS — R2689 Other abnormalities of gait and mobility: Secondary | ICD-10-CM

## 2015-02-22 DIAGNOSIS — M810 Age-related osteoporosis without current pathological fracture: Secondary | ICD-10-CM | POA: Diagnosis not present

## 2015-02-22 DIAGNOSIS — R531 Weakness: Secondary | ICD-10-CM | POA: Diagnosis not present

## 2015-02-22 DIAGNOSIS — R293 Abnormal posture: Secondary | ICD-10-CM | POA: Diagnosis not present

## 2015-02-22 DIAGNOSIS — E785 Hyperlipidemia, unspecified: Secondary | ICD-10-CM | POA: Diagnosis not present

## 2015-02-22 DIAGNOSIS — R42 Dizziness and giddiness: Secondary | ICD-10-CM | POA: Diagnosis not present

## 2015-02-22 NOTE — Therapy (Signed)
[facility] [address] [place], [place], [address] Phone: [phone]   Fax:  [phone]  Physical Therapy Treatment  Patient Details  Name: [name] MRN: [id number] Date of Birth: Apr 17, 1941 Referring Provider:  Lanice Shirts, *  Encounter Date: 02/22/2015      PT End of Session - 02/22/15 1138    Visit Number 9   Number of Visits 10   Date for PT Re-Evaluation 03/01/15   PT Start Time 1102   PT Stop Time 1140   PT Time Calculation (min) 38 min   Activity Tolerance Patient tolerated treatment well   Behavior During Therapy [facility] for tasks assessed/performed      Past Medical History  Diagnosis Date  . HYPERPARATHYROIDISM UNSPECIFIED 10/20/2007  . HYPERLIPIDEMIA 07/07/2007  . ANEMIA-NOS 07/07/2007  . HYPERTENSION 02/04/2008  . DIVERTICULOSIS, COLON 02/23/2009  . UTI'S, RECURRENT 11/20/2009    kimbrough   . OSTEOPOROSIS 01/01/2008  . SCOLIOSIS 05/25/2009  . FATIGUE 05/25/2009  . PALPITATIONS, OCCASIONAL 07/12/2008  . URINARY INCONTINENCE 07/07/2007  . ADVERSE REACTION TO MEDICATION 07/31/2009  . COLONIC POLYPS, ADENOMATOUS, HX OF   . NEPHROLITHIASIS, HX OF 11/15/2008  . PARATHYROIDECTOMY 01/02/2009  . Seasonal allergies   . Unspecified constipation   . Other acquired absence of organ     parathyroidectomy  . Fever, unspecified   . Abdominal pain, unspecified site   . Disturbance of skin sensation   . Pain in limb   . Chest pain, unspecified   . Family history of diabetes mellitus   . Family history of malignant neoplasm of breast     Past Surgical History  Procedure Laterality Date  . Right oophorectomy    . Parathyroidectomy      Rt Superior open neck exploration    There were no vitals filed for this visit.  Visit Diagnosis:  Weakness  Balance disorder      Subjective Assessment - 02/22/15 1107    Subjective Rheumatologist said Osteo meds counteracted by parathyroid. Looking to do  some injections. Feeling great, has not walked yet too cold.   Currently in Pain? No/denies                       [facility] Adult PT Treatment/Exercise - 02/22/15 0001    Lumbar Exercises: Aerobic   Stationary Bike L2 x 10 min   Lumbar Exercises: Standing   Other Standing Lumbar Exercises Review of standing side bend stretch 2x LT, 3x RT, good demo   Lumbar Exercises: Supine   Other Supine Lumbar Exercises Red band horizontal abdu 2x 10  VC on cervical posture, diagonals 10x each side  VC to not lean RT when doing diagonals                PT Education - 02/22/15 1116    Education provided Yes   Education Details HEP, core strength   Person(s) Educated Patient   Methods Explanation;Demonstration;Handout   Comprehension Returned demonstration          PT Short Term Goals - 02/20/15 1119    PT SHORT TERM GOAL #1   Title understand correct body mechanics with daily home tasks and correct posture   Time 3   Period Weeks   Status Achieved   PT SHORT TERM GOAL #2   Title understand ways to avoid falls   Time 3   Period Weeks   Status Achieved  PT Long Term Goals - 02/20/15 1120    PT LONG TERM GOAL #1   Title independent with home exercise program and how to exercise at the gym with correct posture   Time 6   Period Weeks   Status On-going   PT LONG TERM GOAL #2   Title Berg balance score is 56/56   Time 6   Period Weeks   Status On-going   PT LONG TERM GOAL #3   Title perform daily activities with pain decreased >/= 50%   Status Achieved               Plan - 02/22/15 1138    Clinical Impression Statement Instructed pt in core exercises today. Patient demonstrated good, level posture throughout treatment today.   Pt will benefit from skilled therapeutic intervention in order to improve on the following deficits Impaired flexibility;Improper body mechanics;Postural dysfunction;Decreased activity tolerance;Decreased knowledge of  precautions;Pain;Increased muscle spasms;Decreased balance;Decreased mobility;Decreased strength   Rehab Potential Good   Clinical Impairments Affecting Rehab Potential None   PT Frequency 2x / week   PT Duration 6 weeks   PT Next Visit Plan FOTO, probable d/c as auth ends Wednesday and goals will be met.   Consulted and Agree with Plan of Care Patient        Problem List Patient Active Problem List   Diagnosis Date Noted  . Dizziness 04/25/2014  . Vertigo 12/26/2012  . Hypertension, uncontrolled 09/11/2012  . Diastolic dysfunction 55/21/7471  . Sleep disturbance 09/17/2011  . Hypotension 12/26/2010  . Hypotension, postural 12/26/2010  . UTI'S, RECURRENT 11/20/2009  . ADVERSE REACTION TO MEDICATION 07/31/2009  . SCOLIOSIS 05/25/2009  . FATIGUE 05/25/2009  . DIVERTICULOSIS, COLON 02/23/2009  . COLONIC POLYPS, ADENOMATOUS, HX OF 02/23/2009  . PARATHYROIDECTOMY 01/02/2009  . NEPHROLITHIASIS, HX OF 11/15/2008  . PALPITATIONS, OCCASIONAL 07/12/2008  . HYPERTENSION 02/04/2008  . CONSTIPATION, CHRONIC 01/01/2008  . LEG PAIN, LEFT 01/01/2008  . OSTEOPOROSIS 01/01/2008  . HYPERPARATHYROIDISM UNSPECIFIED 10/20/2007  . HYPERLIPIDEMIA 07/07/2007  . ANEMIA-NOS 07/07/2007  . URINARY INCONTINENCE 07/07/2007    Saharra Santo , PTA  02/22/2015, 11:43 AM  [facility] [address], [place], [place], [address] Phone: [phone]   Fax:  [phone]

## 2015-02-22 NOTE — Patient Instructions (Signed)
Lower abdominal/core stability exercises  1. Practice your breathing technique: Inhale through your nose expanding your belly and rib cage. Try not to breathe into your chest. Exhale slowly and gradually out your mouth feeling a sense of softness to your body. Practice multiple times. This can be performed unlimited.  2. Finding the lower abdominals. Laying on your back with the knees bent, place your fingers just below your belly button. Using your breathing technique from above, on your exhale gently pull the belly button away from your fingertips without tensing any other muscles. Practice this 5x. Next, as you exhale, draw belly button inwards and hold onto it...then feel as if you are pulling that muscle across your pelvis like you are tightening a belt. This can be hard to do at first so be patient and practice. Do 5-10 reps 1-3 x day. Always recognize quality over quantity; if your abdominal muscles become tired you will notice you may tighten/contract other muscles. This is the time to take a break.   Practice this first laying on your back, then in sitting, progressing to standing and finally adding it to all your daily movements.   3. Finding your pelvic floor. Using the breathing technique above, when your exhale, this time draw your pelvic floor muscles up as if you were attempting to stop the flow of urination. Be careful NOT to tense any other muscles. This can be hard, BE PATIENT. Try to hold up to 10 seconds repeating 10x. Try 2x a day. Once you feel you are doing this well, add this contraction to exercise #2. First contracting your pelvic floor followed by lower abdominals.  4. Adding leg movements. Add the following leg movements to challenge your ability to keep your core stable:  1. Single leg drop outs: Laying on your back with knees bent feet flat. Inhale,  dropping one knee outward KEEPING YOUR PELVIS STILL. Exhale as you bring the leg back, simultaneously performing your lower  abdominal contraction. Do 5-10 on each leg.  2. Marching: While keeping your pelvis still, lift the right foot a few inches, put it down then lift left foot. This will mimic a march. Start slow to establish control. Once you have control you may speed it up. Do 10-20x. You MUST keep your lower abdominlas contracted while you march. Breathe naturally   3. Single leg slides: Inhale while you slowly slide one leg out keeping your pelvis still. Only slide your leg as far as you can keep your pelvis still. Exhale as you bring the leg back to the start, contracting the lower abdominals as you do that. Keep your upper body relaxed. Do 5-10 on each side.  

## 2015-02-27 ENCOUNTER — Ambulatory Visit: Payer: Medicare Other | Admitting: Physical Therapy

## 2015-02-27 ENCOUNTER — Encounter: Payer: Self-pay | Admitting: Physical Therapy

## 2015-02-27 DIAGNOSIS — R531 Weakness: Secondary | ICD-10-CM

## 2015-02-27 DIAGNOSIS — R293 Abnormal posture: Secondary | ICD-10-CM | POA: Diagnosis not present

## 2015-02-27 DIAGNOSIS — M810 Age-related osteoporosis without current pathological fracture: Secondary | ICD-10-CM | POA: Diagnosis not present

## 2015-02-27 DIAGNOSIS — I10 Essential (primary) hypertension: Secondary | ICD-10-CM | POA: Diagnosis not present

## 2015-02-27 DIAGNOSIS — E785 Hyperlipidemia, unspecified: Secondary | ICD-10-CM | POA: Diagnosis not present

## 2015-02-27 DIAGNOSIS — R2689 Other abnormalities of gait and mobility: Secondary | ICD-10-CM

## 2015-02-27 DIAGNOSIS — R42 Dizziness and giddiness: Secondary | ICD-10-CM | POA: Diagnosis not present

## 2015-02-27 NOTE — Therapy (Signed)
[facility] [address], [place], [place], [address] Phone: [phone]   Fax:  [phone]  Physical Therapy Treatment  Patient Details  Name: [name] MRN: [id number] Date of Birth: Sep 03, 1941 Referring Provider:  Lanice Shirts, *  Encounter Date: 02/27/2015      PT End of Session - 02/27/15 1442    Visit Number 10   Date for PT Re-Evaluation 03/01/15   PT Start Time 1402   PT Stop Time 1442   PT Time Calculation (min) 40 min   Activity Tolerance Patient tolerated treatment well   Behavior During Therapy [facility] for tasks assessed/performed      Past Medical History  Diagnosis Date  . HYPERPARATHYROIDISM UNSPECIFIED 10/20/2007  . HYPERLIPIDEMIA 07/07/2007  . ANEMIA-NOS 07/07/2007  . HYPERTENSION 02/04/2008  . DIVERTICULOSIS, COLON 02/23/2009  . UTI'S, RECURRENT 11/20/2009    kimbrough   . OSTEOPOROSIS 01/01/2008  . SCOLIOSIS 05/25/2009  . FATIGUE 05/25/2009  . PALPITATIONS, OCCASIONAL 07/12/2008  . URINARY INCONTINENCE 07/07/2007  . ADVERSE REACTION TO MEDICATION 07/31/2009  . COLONIC POLYPS, ADENOMATOUS, HX OF   . NEPHROLITHIASIS, HX OF 11/15/2008  . PARATHYROIDECTOMY 01/02/2009  . Seasonal allergies   . Unspecified constipation   . Other acquired absence of organ     parathyroidectomy  . Fever, unspecified   . Abdominal pain, unspecified site   . Disturbance of skin sensation   . Pain in limb   . Chest pain, unspecified   . Family history of diabetes mellitus   . Family history of malignant neoplasm of breast     Past Surgical History  Procedure Laterality Date  . Right oophorectomy    . Parathyroidectomy      Rt Superior open neck exploration    There were no vitals filed for this visit.  Visit Diagnosis:  Weakness  Balance disorder      Subjective Assessment - 02/27/15 1409    Subjective Not having pain, but having MRI to check rheumatoid locations.   Currently in Pain? No/denies                       [facility] Adult PT Treatment/Exercise - 02/27/15 0001    Berg Balance Test   Sit to Stand Able to stand without using hands and stabilize independently   Standing Unsupported Able to stand safely 2 minutes   Sitting with Back Unsupported but Feet Supported on Floor or Stool Able to sit safely and securely 2 minutes   Stand to Sit Sits safely with minimal use of hands   Transfers Able to transfer safely, minor use of hands   Standing Unsupported with Eyes Closed Able to stand 10 seconds safely   Standing Ubsupported with Feet Together Able to place feet together independently and stand 1 minute safely   From Standing, Reach Forward with Outstretched Arm Can reach forward >12 cm safely (5")   From Standing Position, Pick up Object from Floor Able to pick up shoe safely and easily   From Standing Position, Turn to Look Behind Over each Shoulder Looks behind one side only/other side shows less weight shift   Turn 360 Degrees Able to turn 360 degrees safely in 4 seconds or less   Standing Unsupported, Alternately Place Feet on Step/Stool Able to stand independently and safely and complete 8 steps in 20 seconds   Standing Unsupported, One Foot in Front Able to place foot tandem independently and hold 30 seconds  Standing on One Leg Able to lift leg independently and hold > 10 seconds   Total Score 54   Lumbar Exercises: Aerobic   Stationary Bike L2 x 10 min                PT Education - 03-16-2015 1414    Education provided Yes   Education Details Review of core exercises all.   Person(s) Educated Patient   Methods Explanation;Demonstration;Tactile cues;Verbal cues   Comprehension Verbalized understanding;Returned demonstration;Verbal cues required          PT Short Term Goals - 03-16-2015 1436    PT SHORT TERM GOAL #1   Title understand correct body mechanics with daily home tasks and correct posture   Time 3   Period Weeks   Status Achieved    PT SHORT TERM GOAL #2   Title understand ways to avoid falls   Time 3   Period Weeks   Status Achieved           PT Long Term Goals - 03-16-15 1436    PT LONG TERM GOAL #1   Title independent with home exercise program and how to exercise at the gym with correct posture   Time 6   Period Weeks   Status Achieved   PT LONG TERM GOAL #2   Title Berg balance score is 56/56   Time 6   Period Weeks   Status Not Met  54/54   PT LONG TERM GOAL #3   Title perform daily activities with pain decreased >/= 50%   Time 6   Period Weeks   Status Achieved               Plan - 2015-03-16 1442    Clinical Impression Statement 54/54 BERG, missing goal by only 2 pts, all other goals met and pt ready for DC.   Pt will benefit from skilled therapeutic intervention in order to improve on the following deficits Impaired flexibility;Improper body mechanics;Postural dysfunction;Decreased activity tolerance;Decreased knowledge of precautions;Pain;Increased muscle spasms;Decreased balance;Decreased mobility;Decreased strength   Rehab Potential Good   Clinical Impairments Affecting Rehab Potential None   PT Frequency 2x / week   PT Duration 6 weeks   PT Treatment/Interventions Moist Heat;Therapeutic activities;Patient/family education;Passive range of motion;Therapeutic exercise;Ultrasound;Balance training;Manual techniques;Energy conservation;Cryotherapy;Electrical Stimulation;Functional mobility training   PT Next Visit Plan DC today   Consulted and Agree with Plan of Care Patient          G-Codes - 2015/03/16 1620    Functional Assessment Tool Used Merrilee Jansky balance 54/56   Functional Limitation Mobility: Walking and moving around   Mobility: Walking and Moving Around Goal Status [phone]) At least 1 percent but less than 20 percent impaired, limited or restricted   Mobility: Walking and Moving Around Discharge Status [phone]) At least 1 percent but less than 20 percent impaired, limited or  restricted      Problem List Patient Active Problem List   Diagnosis Date Noted  . Dizziness 04/25/2014  . Vertigo 12/26/2012  . Hypertension, uncontrolled 09/11/2012  . Diastolic dysfunction 13/06/6577  . Sleep disturbance 09/17/2011  . Hypotension 12/26/2010  . Hypotension, postural 12/26/2010  . UTI'S, RECURRENT 11/20/2009  . ADVERSE REACTION TO MEDICATION 07/31/2009  . SCOLIOSIS 05/25/2009  . FATIGUE 05/25/2009  . DIVERTICULOSIS, COLON 02/23/2009  . COLONIC POLYPS, ADENOMATOUS, HX OF 02/23/2009  . PARATHYROIDECTOMY 01/02/2009  . NEPHROLITHIASIS, HX OF 11/15/2008  . PALPITATIONS, OCCASIONAL 07/12/2008  . HYPERTENSION 02/04/2008  . CONSTIPATION, CHRONIC 01/01/2008  .  LEG PAIN, LEFT 01/01/2008  . OSTEOPOROSIS 01/01/2008  . HYPERPARATHYROIDISM UNSPECIFIED 10/20/2007  . HYPERLIPIDEMIA 07/07/2007  . ANEMIA-NOS 07/07/2007  . URINARY INCONTINENCE 07/07/2007   Myrene Galas, PTA 02/27/2015 4:25 PM  [facility] [address], [place], [place], [address] Phone: [phone]   Fax:  [phone]     PHYSICAL THERAPY DISCHARGE SUMMARY  Visits from Start of Care:10 Current functional level related to goals / functional outcomes: See above goals update.    Remaining deficits: Patient scored 54/56 but goal was 56/56.  Patient will continue to work on her balance at home.    Education / Equipment: HEP  Plan: Patient agrees to discharge.  Patient goals were met. Patient is being discharged due to meeting the stated rehab goals.  Thank you for the referral. ?????  Earlie Counts, PT 02/27/2015 4:25 PM

## 2015-02-28 ENCOUNTER — Encounter: Payer: Self-pay | Admitting: Internal Medicine

## 2015-02-28 ENCOUNTER — Ambulatory Visit (INDEPENDENT_AMBULATORY_CARE_PROVIDER_SITE_OTHER): Payer: Medicare Other | Admitting: Internal Medicine

## 2015-02-28 VITALS — BP 146/81 | HR 61 | Resp 79 | Wt 119.0 lb

## 2015-02-28 DIAGNOSIS — Z139 Encounter for screening, unspecified: Secondary | ICD-10-CM

## 2015-02-28 DIAGNOSIS — R55 Syncope and collapse: Secondary | ICD-10-CM

## 2015-02-28 DIAGNOSIS — N390 Urinary tract infection, site not specified: Secondary | ICD-10-CM | POA: Diagnosis not present

## 2015-02-28 LAB — POCT URINALYSIS DIPSTICK
Bilirubin, UA: NEGATIVE
Blood, UA: NEGATIVE
Glucose, UA: NEGATIVE
Ketones, UA: NEGATIVE
Leukocytes, UA: NEGATIVE
Nitrite, UA: NEGATIVE
Protein, UA: NEGATIVE
Spec Grav, UA: <=1.005
Urobilinogen, UA: 0.2
pH, UA: 6.5

## 2015-02-28 NOTE — Progress Notes (Signed)
Subjective:    Patient ID: [name], female    DOB: 07-15-1941, 74 y.o.   MRN: [id number]  HPI 01/25/2015 note Assessment & Plan:  Osteoporosis: Pt had both Prempro years ago and over 10 year of a bisphosphanate. Likely a candidate for Prolia vs other injectables. Will refer to rheumatology  Abnormal Urine pt asymptomatic Will send for culture further management based on results  Mixed urinary incontinence She would like an inexpensive option . Will try ditropan but will not start until urine culture and treatment done Pt voices understadning  H/O HyperPTH S/P parathyroidectomy        TODAY  [name] is here to follow up on above.   Osteoporosis   She has been to see Dr. Trudie Reed - I do not have her note as yet  She also has been attending PT to develop muscle strength and improve balance for fall prevention  Urine Culture shows  Multiresistant E coli.  She had been evaluated by Dr. Serita Butcher in the past   Pt was completely asymptomatic .   E coli sensitive to macrodantin which pt completed   She had near syncopal episode at [employer] 2 weeks ago .  Broke out in a sweat and had to sit down.  No chest discomfort or palpitations. Did not completely lose consiousness.  She does report she does not drink enough liquids  Allergies  Allergen Reactions  . Lisinopril     REACTION: cough   Past Medical History  Diagnosis Date  . HYPERPARATHYROIDISM UNSPECIFIED 10/20/2007  . HYPERLIPIDEMIA 07/07/2007  . ANEMIA-NOS 07/07/2007  . HYPERTENSION 02/04/2008  . DIVERTICULOSIS, COLON 02/23/2009  . UTI'S, RECURRENT 11/20/2009    kimbrough   . OSTEOPOROSIS 01/01/2008  . SCOLIOSIS 05/25/2009  . FATIGUE 05/25/2009  . PALPITATIONS, OCCASIONAL 07/12/2008  . URINARY INCONTINENCE 07/07/2007  . ADVERSE REACTION TO MEDICATION 07/31/2009  . COLONIC POLYPS, ADENOMATOUS, HX OF   . NEPHROLITHIASIS, HX OF 11/15/2008  . PARATHYROIDECTOMY 01/02/2009  . Seasonal allergies   . Unspecified constipation    . Other acquired absence of organ     parathyroidectomy  . Fever, unspecified   . Abdominal pain, unspecified site   . Disturbance of skin sensation   . Pain in limb   . Chest pain, unspecified   . Family history of diabetes mellitus   . Family history of malignant neoplasm of breast    Past Surgical History  Procedure Laterality Date  . Right oophorectomy    . Parathyroidectomy      Rt Superior open neck exploration   History   Social History  . Marital Status: Married    Spouse Name: N/A  . Number of Children: 5  . Years of Education: N/A   Occupational History  . travel agent    Social History Main Topics  . Smoking status: Never Smoker   . Smokeless tobacco: Never Used  . Alcohol Use: 3.0 oz/week    5 Glasses of wine per week     Comment: occ. wine  . Drug Use: No  . Sexual Activity: No   Other Topics Concern  . Not on file   Social History Narrative   HH of 5    Kids at home now   Married   travel agent.    2-3 c coffee in am    ocass wine    G3P2vaginal delivery   Pt cell [phone]            Family  History  Problem Relation Age of Onset  . Breast cancer Other     1st degree relative <50  . Diabetes      1st degree relative  . Sudden death Father 27  . Heart disease Father 68    MI  . Heart disease Mother     valve replacement  . Colon cancer Neg Hx   . Stomach cancer Neg Hx    Patient Active Problem List   Diagnosis Date Noted  . Dizziness 04/25/2014  . Vertigo 12/26/2012  . Hypertension, uncontrolled 09/11/2012  . Diastolic dysfunction 17/49/4496  . Sleep disturbance 09/17/2011  . Hypotension 12/26/2010  . Hypotension, postural 12/26/2010  . UTI'S, RECURRENT 11/20/2009  . ADVERSE REACTION TO MEDICATION 07/31/2009  . SCOLIOSIS 05/25/2009  . FATIGUE 05/25/2009  . DIVERTICULOSIS, COLON 02/23/2009  . COLONIC POLYPS, ADENOMATOUS, HX OF 02/23/2009  . PARATHYROIDECTOMY 01/02/2009  . NEPHROLITHIASIS, HX OF 11/15/2008  . PALPITATIONS,  OCCASIONAL 07/12/2008  . HYPERTENSION 02/04/2008  . CONSTIPATION, CHRONIC 01/01/2008  . LEG PAIN, LEFT 01/01/2008  . OSTEOPOROSIS 01/01/2008  . HYPERPARATHYROIDISM UNSPECIFIED 10/20/2007  . HYPERLIPIDEMIA 07/07/2007  . ANEMIA-NOS 07/07/2007  . URINARY INCONTINENCE 07/07/2007   Current Outpatient Prescriptions on File Prior to Visit  Medication Sig Dispense Refill  . aspirin 81 MG tablet Take 81 mg by mouth daily.      [name] atorvastatin (LIPITOR) 20 MG tablet TAKE 1 TABLET EACH DAY. 90 tablet 2  . cholecalciferol (VITAMIN D) 1000 UNITS tablet Take 1,000 Units by mouth daily.    . diazepam (VALIUM) 2 MG tablet Take 0.5-1 tablets (1-2 mg total) by mouth daily as needed. 30 tablet 1  . diclofenac (VOLTAREN) 75 MG EC tablet Take 75 mg by mouth as needed.     . diphenhydramine-acetaminophen (TYLENOL PM) 25-500 MG TABS Take 0.5 tablets by mouth at bedtime as needed.    . loratadine (CLARITIN) 10 MG tablet Take 10 mg by mouth daily.    [name] losartan (COZAAR) 100 MG tablet Take one -half tablet daily    . nitrofurantoin, macrocrystal-monohydrate, (MACROBID) 100 MG capsule Take 1 capsule (100 mg total) by mouth 2 (two) times daily. 20 capsule 0  . sulfamethoxazole-trimethoprim (BACTRIM DS,SEPTRA DS) 800-160 MG per tablet Take one tablet twice daily for 7 days 14 tablet 0   No current facility-administered medications on file prior to visit.       Review of Systems See HPI    Objective:   Physical Exam Physical Exam  Nursing note and vitals reviewed.  Constitutional: She is oriented to person, place, and time. She appears well-developed and well-nourished.  HENT:  Head: Normocephalic and atraumatic.  Cardiovascular: Normal rate and regular rhythm. Exam reveals no gallop and no friction rub.  No murmur heard.  Pulmonary/Chest: Breath sounds normal. She has no wheezes. She has no rales.  Neurological: She is alert and oriented to person, place, and time.  Skin: Skin is warm and dry.    Psychiatric: She has a normal mood and affect. Her behavior is normal.       Assessment & Plan:  UTI  U/A today pending  osteroporosis  Managed by rheumatology  Near syncope  EKG to day  Sinus bradycardia. No acute change from past EKG.  Pt advised to increase oral fluids and if she has an episode of complete LOC she is to go to ER  She voices understanding  Pt informed of my departure and letter given with alternative PCP options

## 2015-02-28 NOTE — Addendum Note (Signed)
Addended by: Gretchen Short on: 02/28/2015 11:30 AM   Modules accepted: Orders

## 2015-03-01 ENCOUNTER — Encounter: Payer: No Typology Code available for payment source | Admitting: Physical Therapy

## 2015-03-02 ENCOUNTER — Telehealth: Payer: Self-pay | Admitting: *Deleted

## 2015-03-02 ENCOUNTER — Ambulatory Visit: Payer: Medicare Other | Admitting: Physical Therapy

## 2015-03-02 ENCOUNTER — Other Ambulatory Visit: Payer: Self-pay | Admitting: *Deleted

## 2015-03-02 MED ORDER — LOSARTAN POTASSIUM 100 MG PO TABS: ORAL_TABLET | ORAL | Status: DC

## 2015-03-02 NOTE — Telephone Encounter (Signed)
[name] is requesting something to help her sleep at night. She use to take Ambien.

## 2015-03-02 NOTE — Telephone Encounter (Signed)
Refill request

## 2015-03-06 ENCOUNTER — Telehealth: Payer: Self-pay | Admitting: Internal Medicine

## 2015-03-06 NOTE — Telephone Encounter (Signed)
Left message on mobile and cell phone to call office regarding rheumatology visit

## 2015-03-07 ENCOUNTER — Other Ambulatory Visit: Payer: Self-pay | Admitting: Internal Medicine

## 2015-03-07 MED ORDER — ESZOPICLONE 2 MG PO TABS: 2.0000 mg | ORAL_TABLET | Freq: Every evening | ORAL | Status: DC | PRN

## 2015-03-09 ENCOUNTER — Encounter: Payer: Self-pay | Admitting: *Deleted

## 2015-03-14 ENCOUNTER — Telehealth: Payer: Self-pay | Admitting: Internal Medicine

## 2015-03-14 DIAGNOSIS — R768 Other specified abnormal immunological findings in serum: Secondary | ICD-10-CM | POA: Diagnosis not present

## 2015-03-14 DIAGNOSIS — M7989 Other specified soft tissue disorders: Secondary | ICD-10-CM | POA: Diagnosis not present

## 2015-03-14 DIAGNOSIS — E213 Hyperparathyroidism, unspecified: Secondary | ICD-10-CM | POA: Diagnosis not present

## 2015-03-14 DIAGNOSIS — M810 Age-related osteoporosis without current pathological fracture: Secondary | ICD-10-CM | POA: Diagnosis not present

## 2015-03-14 NOTE — Telephone Encounter (Signed)
Pt would like to est w/ someone. Perhaps dr Maudie Mercury. Even the [place] office would be ok. Pt aware you are out rhis week and wil wait for your call

## 2015-03-24 DIAGNOSIS — M810 Age-related osteoporosis without current pathological fracture: Secondary | ICD-10-CM | POA: Diagnosis not present

## 2015-03-27 ENCOUNTER — Ambulatory Visit (INDEPENDENT_AMBULATORY_CARE_PROVIDER_SITE_OTHER): Payer: Medicare Other | Admitting: Internal Medicine

## 2015-03-27 ENCOUNTER — Encounter: Payer: Self-pay | Admitting: Internal Medicine

## 2015-03-27 VITALS — BP 136/80 | HR 62 | Temp 97.9°F | Resp 16 | Ht 61.0 in | Wt 121.4 lb

## 2015-03-27 DIAGNOSIS — G479 Sleep disorder, unspecified: Secondary | ICD-10-CM | POA: Diagnosis not present

## 2015-03-27 MED ORDER — ZOLPIDEM TARTRATE 5 MG PO TABS: 5.0000 mg | ORAL_TABLET | Freq: Every evening | ORAL | Status: DC | PRN

## 2015-03-27 NOTE — Progress Notes (Signed)
Pre visit review using our clinic review tool, if applicable. No additional management support is needed unless otherwise documented below in the visit note. 

## 2015-03-27 NOTE — Assessment & Plan Note (Signed)
Advised her that tylenol pm is likely safer than the ambien for long term use. Rx for Blanket today and she can use it only when she is not able to fall asleep for several hours. Talked to her about behavioral modifications that can help sleep including adjusting lights, limiting activities in the bedroom to sleep as well as relaxation techniques to help her relax and fall asleep.

## 2015-03-27 NOTE — Progress Notes (Signed)
   Subjective:    Patient ID: [name], female    DOB: 05-30-1941, 74 y.o.   MRN: [id number]  HPI The patient is a 74 YO female who is coming in for problems sleeping. She was taking tylenol PM to help her stay asleep (1/2 pill at night) but then got concerned that it might cause memory problems or other and stopped. She was going to try [place] but her insurance would not cover it. She has taken ambien in the past (1/4 pil) at night to help her get to sleep (only when she cannot fall asleep which she usually can). Denies weight loss or fevers or chills. No other new symptoms. Has a lot of troubles with her stomach.   Review of Systems  Constitutional: Negative for fever, activity change, appetite change, fatigue and unexpected weight change.  HENT: Negative.   Eyes: Negative.   Respiratory: Negative for cough, chest tightness, shortness of breath and wheezing.   Cardiovascular: Negative.   Gastrointestinal: Positive for diarrhea and constipation.  Neurological: Negative.   Psychiatric/Behavioral: Positive for sleep disturbance. Negative for suicidal ideas, self-injury, dysphoric mood and decreased concentration.      Objective:   Physical Exam  Constitutional: She is oriented to person, place, and time. She appears well-developed and well-nourished.  HENT:  Head: Normocephalic and atraumatic.  Eyes: EOM are normal.  Neck: Normal range of motion.  Cardiovascular: Normal rate and regular rhythm.   Carotids without murmur bilaterally.  Pulmonary/Chest: Effort normal and breath sounds normal.  Abdominal: Soft.  Musculoskeletal: She exhibits no edema.  Neurological: She is alert and oriented to person, place, and time.  Skin: Skin is warm and dry.  Psychiatric: She has a normal mood and affect.   Filed Vitals:   03/27/15 1025  BP: 136/80  Pulse: 62  Temp: 97.9 F (36.6 C)  TempSrc: Oral  Resp: 16  Height: 5\' 1"  (1.549 m)  Weight: 121 lb 6.4 oz (55.067 kg)  SpO2: 97%     Assessment & Plan:

## 2015-03-27 NOTE — Patient Instructions (Signed)
We will give you the ambien in case you need it. It is also okay to take the tylenol pm if you need it at night.   We will see you back next February for your physical. Please feel free to call us if you need [place] sooner.   Health Maintenance Adopting a healthy lifestyle and getting preventive care can go a long way to promote health and wellness. Talk with your health care provider about what schedule of regular examinations is right for you. This is a good chance for you to check in with your provider about disease prevention and staying healthy. In between checkups, there are plenty of things you can do on your own. Experts have done a lot of research about which lifestyle changes and preventive measures are most likely to keep you healthy. Ask your health care provider for more information. WEIGHT AND DIET  Eat a healthy diet  Be sure to include plenty of vegetables, fruits, low-fat dairy products, and lean protein.  Do not eat a lot of foods high in solid fats, added sugars, or salt.  Get regular exercise. This is one of the most important things you can do for your health.  Most adults should exercise for at least 150 minutes each week. The exercise should increase your heart rate and make you sweat (moderate-intensity exercise).  Most adults should also do strengthening exercises at least twice a week. This is in addition to the moderate-intensity exercise.  Maintain a healthy weight  Body mass index (BMI) is a measurement that can be used to identify possible weight problems. It estimates body fat based on height and weight. Your health care provider can help determine your BMI and help you achieve or maintain a healthy weight.  For females 41 years of age and older:   A BMI below 18.5 is considered underweight.  A BMI of 18.5 to 24.9 is normal.  A BMI of 25 to 29.9 is considered overweight.  A BMI of 30 and above is considered obese.  Watch levels of cholesterol and blood  lipids  You should start having your blood tested for lipids and cholesterol at 75 years of age, then have this test every 5 years.  You may need to have your cholesterol levels checked more often if:  Your lipid or cholesterol levels are high.  You are older than 75 years of age.  You are at high risk for heart disease.  CANCER SCREENING   Lung Cancer  Lung cancer screening is recommended for adults 71-26 years old who are at high risk for lung cancer because of a history of smoking.  A yearly low-dose CT scan of the lungs is recommended for people who:  Currently smoke.  Have quit within the past 15 years.  Have at least a 30-pack-year history of smoking. A pack year is smoking an average of one pack of cigarettes a day for 1 year.  Yearly screening should continue until it has been 15 years since you quit.  Yearly screening should stop if you develop a health problem that would prevent you from having lung cancer treatment.  Breast Cancer  Practice breast self-awareness. This means understanding how your breasts normally appear and feel.  It also means doing regular breast self-exams. Let your health care provider know about any changes, no matter how small.  If you are in your 20s or 30s, you should have a clinical breast exam (CBE) by a health care provider every 1-3 years  as part of a regular health exam.  If you are 30 or older, have a CBE every year. Also consider having a breast X-ray (mammogram) every year.  If you have a family history of breast cancer, talk to your health care provider about genetic screening.  If you are at high risk for breast cancer, talk to your health care provider about having an MRI and a mammogram every year.  Breast cancer gene (BRCA) assessment is recommended for women who have family members with BRCA-related cancers. BRCA-related cancers include:  Breast.  Ovarian.  Tubal.  Peritoneal cancers.  Results of the assessment  will determine the need for genetic counseling and BRCA1 and BRCA2 testing. Cervical Cancer Routine pelvic examinations to screen for cervical cancer are no longer recommended for nonpregnant women who are considered low risk for cancer of the pelvic organs (ovaries, uterus, and vagina) and who do not have symptoms. A pelvic examination may be necessary if you have symptoms including those associated with pelvic infections. Ask your health care provider if a screening pelvic exam is right for you.   The Pap test is the screening test for cervical cancer for women who are considered at risk.  If you had a hysterectomy for a problem that was not cancer or a condition that could lead to cancer, then you no longer need Pap tests.  If you are older than 65 years, and you have had normal Pap tests for the past 10 years, you no longer need to have Pap tests.  If you have had past treatment for cervical cancer or a condition that could lead to cancer, you need Pap tests and screening for cancer for at least 20 years after your treatment.  If you no longer get a Pap test, assess your risk factors if they change (such as having a new sexual partner). This can affect whether you should start being screened again.  Some women have medical problems that increase their chance of getting cervical cancer. If this is the case for you, your health care provider may recommend more frequent screening and Pap tests.  The human papillomavirus (HPV) test is another test that may be used for cervical cancer screening. The HPV test looks for the virus that can cause cell changes in the cervix. The cells collected during the Pap test can be tested for HPV.  The HPV test can be used to screen women 54 years of age and older. Getting tested for HPV can extend the interval between normal Pap tests from three to five years.  An HPV test also should be used to screen women of any age who have unclear Pap test results.  After  75 years of age, women should have HPV testing as often as Pap tests.  Colorectal Cancer  This type of cancer can be detected and often prevented.  Routine colorectal cancer screening usually begins at 75 years of age and continues through 75 years of age.  Your health care provider may recommend screening at an earlier age if you have risk factors for colon cancer.  Your health care provider may also recommend using home test kits to check for hidden blood in the stool.  A small camera at the end of a tube can be used to examine your colon directly (sigmoidoscopy or colonoscopy). This is done to check for the earliest forms of colorectal cancer.  Routine screening usually begins at age 56.  Direct examination of the colon should be repeated every  5-10 years through 75 years of age. However, you may need to be screened more often if early forms of precancerous polyps or small growths are found. Skin Cancer  Check your skin from head to toe regularly.  Tell your health care provider about any new moles or changes in moles, especially if there is a change in a mole's shape or color.  Also tell your health care provider if you have a mole that is larger than the size of a pencil eraser.  Always use sunscreen. Apply sunscreen liberally and repeatedly throughout the day.  Protect yourself by wearing long sleeves, pants, a wide-brimmed hat, and sunglasses whenever you are outside. HEART DISEASE, DIABETES, AND HIGH BLOOD PRESSURE   Have your blood pressure checked at least every 1-2 years. High blood pressure causes heart disease and increases the risk of stroke.  If you are between 52 years and 1 years old, ask your health care provider if you should take aspirin to prevent strokes.  Have regular diabetes screenings. This involves taking a blood sample to check your fasting blood sugar level.  If you are at a normal weight and have a low risk for diabetes, have this test once every  three years after 75 years of age.  If you are overweight and have a high risk for diabetes, consider being tested at a younger age or more often. PREVENTING INFECTION  Hepatitis B  If you have a higher risk for hepatitis B, you should be screened for this virus. You are considered at high risk for hepatitis B if:  You were born in a country where hepatitis B is common. Ask your health care provider which countries are considered high risk.  Your parents were born in a high-risk country, and you have not been immunized against hepatitis B (hepatitis B vaccine).  You have HIV or AIDS.  You use needles to inject street drugs.  You live with someone who has hepatitis B.  You have had sex with someone who has hepatitis B.  You get hemodialysis treatment.  You take certain medicines for conditions, including cancer, organ transplantation, and autoimmune conditions. Hepatitis C  Blood testing is recommended for:  Everyone born from [birth year] through 1966.  Anyone with known risk factors for hepatitis C. Sexually transmitted infections (STIs)  You should be screened for sexually transmitted infections (STIs) including gonorrhea and chlamydia if:  You are sexually active and are younger than 75 years of age.  You are older than 75 years of age and your health care provider tells you that you are at risk for this type of infection.  Your sexual activity has changed since you were last screened and you are at an increased risk for chlamydia or gonorrhea. Ask your health care provider if you are at risk.  If you do not have HIV, but are at risk, it may be recommended that you take a prescription medicine daily to prevent HIV infection. This is called pre-exposure prophylaxis (PrEP). You are considered at risk if:  You are sexually active and do not regularly use condoms or know the HIV status of your partner(s).  You take drugs by injection.  You are sexually active with a partner who  has HIV. Talk with your health care provider about whether you are at high risk of being infected with HIV. If you choose to begin PrEP, you should first be tested for HIV. You should then be tested every 3 months for as long as you  are taking PrEP.  PREGNANCY   If you are premenopausal and you may become pregnant, ask your health care provider about preconception counseling.  If you may become pregnant, take 400 to 800 micrograms (mcg) of folic acid every day.  If you want to prevent pregnancy, talk to your health care provider about birth control (contraception). OSTEOPOROSIS AND MENOPAUSE   Osteoporosis is a disease in which the bones lose minerals and strength with aging. This can result in serious bone fractures. Your risk for osteoporosis can be identified using a bone density scan.  If you are 17 years of age or older, or if you are at risk for osteoporosis and fractures, ask your health care provider if you should be screened.  Ask your health care provider whether you should take a calcium or vitamin D supplement to lower your risk for osteoporosis.  Menopause may have certain physical symptoms and risks.  Hormone replacement therapy may reduce some of these symptoms and risks. Talk to your health care provider about whether hormone replacement therapy is right for you.  HOME CARE INSTRUCTIONS   Schedule regular health, dental, and eye exams.  Stay current with your immunizations.   Do not use any tobacco products including cigarettes, chewing tobacco, or electronic cigarettes.  If you are pregnant, do not drink alcohol.  If you are breastfeeding, limit how much and how often you drink alcohol.  Limit alcohol intake to no more than 1 drink per day for nonpregnant women. One drink equals 12 ounces of beer, 5 ounces of wine, or 1 ounces of hard liquor.  Do not use street drugs.  Do not share needles.  Ask your health care provider for help if you need support or  information about quitting drugs.  Tell your health care provider if you often feel depressed.  Tell your health care provider if you have ever been abused or do not feel safe at home. Document Released: 05/20/2011 Document Revised: 03/21/2014 Document Reviewed: 10/06/2013 [facility] Patient Information 2015 [place], [place]. This information is not intended to replace advice given to you by your health care provider. Make sure you discuss any questions you have with your health care provider.

## 2015-04-04 ENCOUNTER — Encounter: Payer: Self-pay | Admitting: Internal Medicine

## 2015-05-10 ENCOUNTER — Encounter: Payer: Self-pay | Admitting: *Deleted

## 2015-06-12 DIAGNOSIS — Z961 Presence of intraocular lens: Secondary | ICD-10-CM | POA: Diagnosis not present

## 2015-06-12 DIAGNOSIS — H52203 Unspecified astigmatism, bilateral: Secondary | ICD-10-CM | POA: Diagnosis not present

## 2015-06-15 DIAGNOSIS — N6489 Other specified disorders of breast: Secondary | ICD-10-CM | POA: Diagnosis not present

## 2015-06-20 DIAGNOSIS — M810 Age-related osteoporosis without current pathological fracture: Secondary | ICD-10-CM | POA: Diagnosis not present

## 2015-06-21 ENCOUNTER — Encounter: Payer: Self-pay | Admitting: Internal Medicine

## 2015-06-26 ENCOUNTER — Telehealth: Payer: Self-pay | Admitting: Internal Medicine

## 2015-06-26 NOTE — Telephone Encounter (Signed)
Patient called and she wanted to be sure her mammogram and an ultra sound that was done this year was sent over to [place]. I do see the mammogram, do not see the ultra sound The ultra sound was of her hand and it was done at [facility].  Can you see if you see it anywhere? If not, do I need to get her in touch with medical records? Please advise

## 2015-06-27 NOTE — Telephone Encounter (Signed)
I can call and get a copy if Dr. Doug Sou needs it.

## 2015-06-27 NOTE — Telephone Encounter (Signed)
Pt informed

## 2015-08-29 ENCOUNTER — Encounter: Payer: Medicare Other | Admitting: Internal Medicine

## 2015-09-18 ENCOUNTER — Telehealth: Payer: Self-pay | Admitting: Internal Medicine

## 2015-09-18 ENCOUNTER — Other Ambulatory Visit: Payer: Self-pay | Admitting: Geriatric Medicine

## 2015-09-18 MED ORDER — ATORVASTATIN CALCIUM 20 MG PO TABS: ORAL_TABLET | ORAL | Status: DC

## 2015-09-18 NOTE — Telephone Encounter (Signed)
Patient called requesting rx for atorvastatin. She uses [facility] CB# [phone]

## 2015-09-29 DIAGNOSIS — J810 Acute pulmonary edema: Secondary | ICD-10-CM | POA: Diagnosis not present

## 2015-10-10 ENCOUNTER — Ambulatory Visit (INDEPENDENT_AMBULATORY_CARE_PROVIDER_SITE_OTHER): Payer: Medicare Other | Admitting: Physician Assistant

## 2015-10-10 ENCOUNTER — Encounter: Payer: Self-pay | Admitting: Physician Assistant

## 2015-10-10 VITALS — BP 174/94 | HR 76 | Ht 60.5 in | Wt 119.4 lb

## 2015-10-10 DIAGNOSIS — Z86010 Personal history of colonic polyps: Secondary | ICD-10-CM

## 2015-10-10 DIAGNOSIS — K589 Irritable bowel syndrome without diarrhea: Secondary | ICD-10-CM | POA: Diagnosis not present

## 2015-10-10 DIAGNOSIS — N8189 Other female genital prolapse: Secondary | ICD-10-CM | POA: Diagnosis not present

## 2015-10-10 MED ORDER — NA SULFATE-K SULFATE-MG SULF 17.5-3.13-1.6 GM/177ML PO SOLN: 1.0000 | Freq: Once | ORAL | Status: DC

## 2015-10-10 NOTE — Progress Notes (Signed)
Patient ID: KATELAND MOWERY, female   DOB: March 08, 1941, 74 y.o.   MRN: [id number]     History of Present Illness: RAFIA RAZZAK is a pleasant 74 year old female who was previously followed by Dr. Olevia Perches. She has a history of chronic constipation, diverticulosis, hypertension, hyperlipidemia, hyperparathyroidism, recurrent UTIs, scoliosis, osteoporosis, palpitations, urinary incontinence, nephrolithiasis, and colon polyps. Her last colonoscopy was on 07/07/2013 at which time she was found to have a sessile polyp measuring 20 mm in size at the cecum within the appendiceal opening. Multiple pieces of the carpeted polyp were removed with a hot snare. It was felt it was possible that there was residual polyp within the appendiceal lumen. Pathology revealed a tubulovillous adenoma. No high-grade dysplasia or malignancy was identified. For some reason the patient received no instructing her to have surveillance in 5 years.  She presents today due to ongoing complaints of constipation and intermittent diarrhea. She reports that she will typically skips 2-1/2-3 weeks between bowel movements, and then passed nugget-like stools followed by explosive diarrhea. Occasionally, she will go out to eat and shortly thereafter have explosive diarrhea. This past Sunday she went out for breakfast and had eggs Benedict and before she left the restaurant had explosive diarrhea. She has tried various fiber supplements and states they all cause her to have diarrhea. If she uses an antidiarrheal medication, she will get terrible abdominal pain and go about 3 weeks without a bowel movement. If she uses any type of laxative she gets explosive diarrhea for several days. She has had no bright red blood per rectum or melena. Her appetite has been good and her weight has been stable. She reports that she has had 3 normal spontaneous vaginal deliveries and had an episiotomy with each delivery.   Past Medical History  Diagnosis Date  .  HYPERPARATHYROIDISM UNSPECIFIED 10/20/2007  . HYPERLIPIDEMIA 07/07/2007  . ANEMIA-NOS 07/07/2007  . HYPERTENSION 02/04/2008  . DIVERTICULOSIS, COLON 02/23/2009  . UTI'S, RECURRENT 11/20/2009    kimbrough   . OSTEOPOROSIS 01/01/2008  . SCOLIOSIS 05/25/2009  . FATIGUE 05/25/2009  . PALPITATIONS, OCCASIONAL 07/12/2008  . URINARY INCONTINENCE 07/07/2007  . ADVERSE REACTION TO MEDICATION 07/31/2009  . COLONIC POLYPS, ADENOMATOUS, HX OF   . NEPHROLITHIASIS, HX OF 11/15/2008  . PARATHYROIDECTOMY 01/02/2009  . Seasonal allergies   . Unspecified constipation   . Other acquired absence of organ     parathyroidectomy  . Fever, unspecified   . Abdominal pain, unspecified site   . Disturbance of skin sensation   . Pain in limb   . Chest pain, unspecified   . Family history of diabetes mellitus   . Family history of malignant neoplasm of breast     Past Surgical History  Procedure Laterality Date  . Right oophorectomy    . Parathyroidectomy      Rt Superior open neck exploration   Family History  Problem Relation Age of Onset  . Breast cancer Other     1st degree relative <50  . Diabetes      1st degree relative  . Sudden death Father 48  . Heart disease Father 65    MI  . Heart disease Mother     valve replacement  . Colon cancer Neg Hx   . Stomach cancer Neg Hx    Social History  Substance Use Topics  . Smoking status: Never Smoker   . Smokeless tobacco: Never Used  . Alcohol Use: 3.0 oz/week    5 Glasses of wine per  week     Comment: occ. wine   Current Outpatient Prescriptions  Medication Sig Dispense Refill  . aspirin 81 MG tablet Take 81 mg by mouth daily.      [name] atorvastatin (LIPITOR) 20 MG tablet TAKE 1 TABLET EACH DAY. 90 tablet 3  . cholecalciferol (VITAMIN D) 1000 UNITS tablet Take 1,000 Units by mouth daily.    . diazepam (VALIUM) 2 MG tablet Take 0.5-1 tablets (1-2 mg total) by mouth daily as needed. 30 tablet 1  . diclofenac (VOLTAREN) 75 MG EC tablet Take 75 mg by mouth  as needed.     . diphenhydramine-acetaminophen (TYLENOL PM) 25-500 MG TABS Take 0.5 tablets by mouth at bedtime as needed.    . loratadine (CLARITIN) 10 MG tablet Take 10 mg by mouth as needed.     [name] losartan (COZAAR) 100 MG tablet Take one -half tablet daily 45 tablet 2  . zolpidem (AMBIEN) 5 MG tablet Take 1 tablet (5 mg total) by mouth at bedtime as needed for sleep. 30 tablet 0   No current facility-administered medications for this visit.   Allergies  Allergen Reactions  . Lisinopril     REACTION: cough     Review of Systems: Gen: Denies any fever, chills, sweats, anorexia, fatigue, weakness, malaise, weight loss, and sleep disorder CV: Denies chest pain, angina, palpitations, syncope, orthopnea, PND, peripheral edema, and claudication. Resp: Denies dyspnea at rest, dyspnea with exercise, cough, sputum, wheezing, coughing up blood, and pleurisy. GI: Denies vomiting blood, jaundice, and fecal incontinence.   Denies dysphagia or odynophagia. GU : Denies urinary burning, blood in urine, urinary frequency, urinary hesitancy, nocturnal urination, and urinary incontinence. MS: Denies joint pain, limitation of movement, and swelling, stiffness, low back pain, extremity pain. Denies muscle weakness, cramps, atrophy.  Derm: Denies rash, itching, dry skin, hives, moles, warts, or unhealing ulcers.  Psych: Denies depression, anxiety, memory loss, suicidal ideation, hallucinations, paranoia, and confusion. Heme: Denies bruising, bleeding, and enlarged lymph nodes. Neuro:  Denies any headaches, dizziness, paresthesia Endo:  Denies any problems with DM, thyroid, adrenal    Physical Exam: BP 174/94 mmHg  Pulse 76  Ht 5' 0.5" (1.537 m)  Wt 119 lb 6 oz (54.148 kg)  BMI 22.92 kg/m2 General: Pleasant, well developed , Caucasian female in no acute distress Head: Normocephalic and atraumatic Eyes:  sclerae anicteric, conjunctiva pink  Ears: Normal auditory acuity Lungs: Clear throughout to  auscultation Heart: Regular rate and rhythm Abdomen: Soft, non distended, non-tender. No masses, no hepatomegaly. Normal bowel sounds Rectal: External skin tags, external hemorrhoid noted. Brown stool, Hemoccult negative. Poor sphincter tone noted. Musculoskeletal: Symmetrical with no gross deformities  Extremities: No edema  Neurological: Alert oriented x 4, grossly nonfocal Psychological:  Alert and cooperative. Normal mood and affect  Assessment and Recommendations: #1. History tubulovillous adenoma. The polyp was 20 mm in size and carpeted. She will be scheduled for a colonoscopy to reevaluate the area of polypectomy as well as to evaluate for any other polyps or neoplasia.The risks, benefits, and alternatives to colonoscopy with possible biopsy and possible polypectomy were discussed with the patient and they consent to proceed.  The procedure will be scheduled with Dr. Silverio Decamp.   #2. Pelvic floor dysfunction/weakness. Patient describes urinary incontinence and diarrhea. Her pelvic floor weakness is likely the etiology. She has had 3 episiotomies with her normal spontaneous vaginal deliveries. It was explained to the patient that if we give her anything for constipation, her stools would likely become watery, and  with her diminish anal sphincter tone it would likely leak out. She has been referred to [facility] for physical therapy for pelvic floor strengthening. In the meantime, she will also try a lactose-free diet to see if this helps.        Dewane Timson, Deloris Ping 10/10/2015,

## 2015-10-10 NOTE — Progress Notes (Signed)
Reviewed and agree with documentation and assessment and plan. K. Veena Nandigam , MD   

## 2015-10-10 NOTE — Patient Instructions (Addendum)
You have been scheduled for a colonoscopy. Please follow written instructions given to you at your visit today.  Please pick up your prep supplies at the pharmacy within the next 1-3 days. If you use inhalers (even only as needed), please bring them with you on the day of your procedure. Your physician has requested that you go to www.startemmi.com and enter the access code given to you at your visit today. This web site gives a general overview about your procedure. However, you should still follow specific instructions given to you by our office regarding your preparation for the procedure.  Use P Friuts- Peaches ,Plums, Pears, Prunes,Plums,Pinapple   Increase Water intake  Lactose-Free Diet, Adult If you have lactose intolerance, you are not able to digest lactose. Lactose is a natural sugar found mainly in milk and milk products. You may need to avoid all foods and beverages that contain lactose. A lactose-free diet can help you do this.  WHAT DO I NEED TO KNOW ABOUT THIS DIET?  Do not consume foods, beverages, vitamins, minerals, or medicines with lactose. Read ingredients lists carefully.  Look for the words "lactose-free" on labels.  Use lactase enzyme drops or tablets as directed by your health care provider.  Use lactose-free milk or a milk alternative, such as soy milk, for drinking and cooking.  Make sure you get enough calcium and vitamin D in your diet. A lactose-free eating plan can be lacking in these important nutrients.  Take calcium and vitamin D supplements as directed by your health care provider. Talk to your provider about supplements if you are not able to get enough calcium and vitamin D from food. WHICH FOODS HAVE LACTOSE? Lactose is found in:   Milk and foods made from milk.  Yogurt.   Cheese.  Butter.   Margarine.   Sour cream.   Cream.   Whipped toppings and nondairy creamers.  Ice cream and other milk-based desserts. Lactose is also found  in foods or products made with milk or milk ingredients. To find out whether a food contains milk or a milk ingredient, look at the ingredients list. Avoid foods with the statement "May contain milk" and foods that contain:   Butter.   Cream.  Milk.  Milk solids.  Milk powder.   Whey.  Curd.  Caseinate.  Lactose.  Lactalbumin.  Lactoglobulin. WHAT ARE SOME ALTERNATIVES TO MILK AND FOODS MADE WITH MILK PRODUCTS?  Lactose-free milk.  Soy milk with added calcium and vitamin D.  Almond, coconut, or rice milk with added calcium and vitamin D. Note that these are low in protein.   Soy products, such as soy yogurt, soy cheese, soy ice cream, and soy-based sour cream. WHICH FOODS CAN I EAT? Grains Breads and rolls made without milk, such as [place], [place], or [place] bread, bagels, pita, and [employer]. Corn tortillas, corn meal, grits, and polenta. Crackers without lactose or milk solids, such as soda crackers and graham crackers. Cooked or dry cereals without lactose or milk solids. Pasta, quinoa, couscous, barley, oats, bulgur, farro, rice, wild rice, or other grains prepared without milk or lactose. Plain popcorn.  Vegetables Fresh, frozen, and canned vegetables without cheese, cream, or butter sauces. Fruits All fresh, canned, frozen, or dried fruits that are not processed with lactose. Meats and Other Protein Sources Plain beef, chicken, fish, [place], lamb, veal, pork, wild game, or ham. Kosher-prepared meat products. Strained or junior meats that do not contain milk. Eggs. Soy meat substitutes. Beans, lentils, and hummus. Tofu.  Nuts and seeds. Peanut or other nut butters without lactose. Soups, casseroles, and mixed dishes without cheese, cream, or milk.  Dairy Lactose-free milk. Soy, rice, or almond milk with added calcium and vitamin D. Soy cheese and yogurt. Beverages Carbonated drinks. Tea. Coffee, freeze-dried coffee, and some instant coffees. Fruit and vegetable  juices.  Condiments Soy sauce. Carob powder. Olives. Gravy made with water. Baker's cocoa. Angie Fava. Pure seasonings and spices. Ketchup. Mustard. Bouillon. Broth.  Sweets and Desserts Water and fruit ices. Gelatin. Cookies, pies, or cakes made from allowed ingredients, such as angel food cake. Pudding made with water or a milk substitute. Lactose-free tofu desserts. Soy, coconut milk, or rice-milk-based frozen desserts. Sugar. Honey. Jam, jelly, and marmalade. Molasses. Pure sugar candy. Dark chocolate without milk. Marshmallows.  Fats and Oils Margarines and salad dressings that do not contain milk. Berniece Salines. Vegetable oils. Shortening. Mayonnaise. Soy or coconut-based cream.  The items listed above may not be a complete list of recommended foods or beverages. Contact your dietitian for more options.  WHICH FOODS ARE NOT RECOMMENDED? Grains Breads and rolls that contain milk. Toaster pastries. Muffins, biscuits, waffles, cornbread, and pancakes. These can be prepared at home, commercial, or from mixes. Sweet rolls, donuts, English muffins, fry bread, lefse, flour tortillas with lactose, or [place] toast made with milk or milk ingredients. Crackers that contain lactose. Corn curls. Cooked or dry cereals with lactose. Vegetables Creamed or breaded vegetables. Vegetables in a cheese or butter sauce or with lactose-containing margarines. Instant potatoes. [place] fries. Scalloped or au gratin potatoes.  Fruits None.  Meats and Other Protein Sources Scrambled eggs, omelets, and souffles that contain milk. Creamed or breaded meat, fish, chicken, or [place]. Sausage products, such as wieners and liver sausage. Cold cuts that contain milk solids. Cheese, cottage cheese, ricotta cheese, and cheese spreads. Lasagna and macaroni and cheese. Pizza. Peanut or other nut butters with added milk solids. Casseroles or mixed dishes containing milk or cheese.  Dairy All dairy products, including milk, goat's milk,  buttermilk, kefir, acidophilus milk, flavored milk, evaporated milk, condensed milk, dulce de Watkins, eggnog, yogurt, cheese, and cheese spreads.  Beverages Hot chocolate. Cocoa with lactose. Instant iced teas. Powdered fruit drinks. Smoothies made with milk or yogurt.  Condiments Chewing gum that has lactose. Cocoa that has lactose. Spice blends if they contain milk products. Artificial sweeteners that contain lactose. Nondairy creamers.  Sweets and Desserts Ice cream, ice milk, gelato, sherbet, and frozen yogurt. Custard, pudding, and mousse. Cake, cream pies, cookies, and other desserts containing milk, cream, cream cheese, or milk chocolate. Pie crust made with milk-containing margarine or butter. Reduced-calorie desserts made with a sugar substitute that contains lactose. Toffee and butterscotch. Milk, white, or dark chocolate that contains milk. Fudge. Caramel.  Fats and Oils Margarines and salad dressings that contain milk or cheese. Cream. Half and half. Cream cheese. Sour cream. Chip dips made with sour cream or yogurt.  The items listed above may not be a complete list of foods and beverages to avoid. Contact your dietitian for more information. AM I GETTING ENOUGH CALCIUM? Calcium is found in many foods that contain lactose and is important for bone health. The amount of calcium you need depends on your age:   Adults younger than 50 years: 1000 mg of calcium a day.  Adults older than 50 years: 1200 mg of calcium a day. If you are not getting enough calcium, other calcium sources include:   Orange juice with calcium added. There are 300-350 mg  of calcium in 1 cup of orange juice.   Sardines with edible bones. There are 325 mg of calcium in 3 oz of sardines.   Calcium-fortified soy milk. There are 300-400 mg of calcium in 1 cup of calcium-fortified soy milk.  Calcium-fortified rice or almond milk. There are 300 mg of calcium in 1 cup of calcium-fortified rice or almond  milk.  Canned salmon with edible bones. There are 180 mg of calcium in 3 oz of canned salmon with edible bones.   Calcium-fortified breakfast cereals. There are [id number] mg of calcium in calcium-fortified breakfast cereals.   Tofu set with calcium sulfate. There are 250 mg of calcium in  cup of tofu set with calcium sulfate.  Spinach, cooked. There are 145 mg of calcium in  cup of cooked spinach.  Edamame, cooked. There are 130 mg of calcium in  cup of cooked edamame.  Collard greens, cooked. There are 125 mg of calcium in  cup of cooked collard greens.  Kale, frozen or cooked. There are 90 mg of calcium in  cup of cooked or frozen kale.   Almonds. There are 95 mg of calcium in  cup of almonds.  Broccoli, cooked. There are 60 mg of calcium in 1 cup of cooked broccoli.   This information is not intended to replace advice given to you by your health care provider. Make sure you discuss any questions you have with your health care provider.   Document Released: 04/26/2002 Document Revised: 03/21/2015 Document Reviewed: 02/04/2014 Elsevier Interactive Patient Education [employer].   We will put in a referral for you to see Earlie Counts for pelvic floor therapy

## 2015-10-23 ENCOUNTER — Ambulatory Visit (AMBULATORY_SURGERY_CENTER): Payer: Medicare Other | Admitting: Gastroenterology

## 2015-10-23 ENCOUNTER — Encounter: Payer: Self-pay | Admitting: Gastroenterology

## 2015-10-23 VITALS — BP 180/89 | HR 79 | Temp 96.5°F | Resp 22 | Ht 60.0 in | Wt 119.0 lb

## 2015-10-23 DIAGNOSIS — D121 Benign neoplasm of appendix: Secondary | ICD-10-CM | POA: Diagnosis not present

## 2015-10-23 DIAGNOSIS — D120 Benign neoplasm of cecum: Secondary | ICD-10-CM

## 2015-10-23 DIAGNOSIS — K589 Irritable bowel syndrome without diarrhea: Secondary | ICD-10-CM | POA: Diagnosis not present

## 2015-10-23 DIAGNOSIS — Z86010 Personal history of colonic polyps: Secondary | ICD-10-CM

## 2015-10-23 MED ORDER — SODIUM CHLORIDE 0.9 % IV SOLN: 500.0000 mL | INTRAVENOUS | Status: DC

## 2015-10-23 NOTE — Progress Notes (Signed)
Report to PACU, RN, vss, BBS= Clear.  

## 2015-10-23 NOTE — Progress Notes (Signed)
Called to room to assist during endoscopic procedure.  Patient ID and intended procedure confirmed with present staff. Received instructions for my participation in the procedure from the performing physician.  

## 2015-10-23 NOTE — Op Note (Signed)
[place]  [address]. [place], [id number]   COLONOSCOPY PROCEDURE REPORT  PATIENT: [name], [name]  MR#: [id number] BIRTHDATE: Feb 05, 1941 , 64  yrs. old GENDER: female ENDOSCOPIST: Harl Bowie, MD REFERRED [id number] Crawford, M.D. PROCEDURE DATE:  10/23/2015 PROCEDURE:   Colonoscopy, surveillance , Colonoscopy with snare polypectomy, and Submucosal injection, any substance First Screening Colonoscopy - Avg.  risk and is 50 yrs.  old or older - No.  Prior Negative Screening - Now for repeat screening. N/A  History of Adenoma - Now for follow-up colonoscopy & has been > or = to 3 yrs.  No.  It has been less than 3 yrs since last colonoscopy.  Other: See Comments  Polyps removed today? Yes ASA CLASS:   Class II INDICATIONS:Surveillance due to prior colonic neoplasia and PH Colon Adenoma. MEDICATIONS: Propofol 400 mg IV  DESCRIPTION OF PROCEDURE:   After the risks benefits and alternatives of the procedure were thoroughly explained, informed consent was obtained.  The digital rectal exam revealed no abnormalities of the rectum.   The LB [id number]-[id number]  endoscope was introduced through the anus and advanced to the cecum, which was identified by both the appendix and ileocecal valve. No adverse events experienced.   The quality of the prep was good.  The instrument was then slowly withdrawn as the colon was fully examined. Estimated blood loss is zero unless otherwise noted in this procedure report.   COLON FINDINGS: 9 mm polyp in the cecum at the appendiceal orifice, injected submucosal saline to lift the mucosa, was unable to completely lift the polyp, snare polypectomy was performed in piecemeal but felt there may be an invasive complement and it was incomplete resection.   There was mild diverticulosis noted in the sigmoid colon.   Small internal hemorrhoids were found. Retroflexed views revealed internal hemorrhoids. The time to cecum = 17.6  Withdrawal time = 29.1   The scope was withdrawn and the procedure completed. COMPLICATIONS: There were no immediate complications.  ENDOSCOPIC IMPRESSION: 1.   9 mm polyp in the cecum at the appendiceal orifice, injected submucosal saline to lift the mucosa, was unable to completely lift the polyp, snare polypectomy was performed in piecemeal but felt there may be an invasive complement and it was incomplete resection  2.   Mild diverticulosis was noted in the sigmoid colon 3.   Small internal hemorrhoids  RECOMMENDATIONS: Await pathology results Will refer to surgery Repeat colonoscopy in 6-12 months based on pathology and surgical consult  eSigned:  Harl Bowie, MD 10/23/2015 11:24 AM

## 2015-10-23 NOTE — Patient Instructions (Signed)
YOU HAD AN ENDOSCOPIC PROCEDURE TODAY AT [facility]:   Refer to the procedure report that was given to you for any specific questions about what was found during the examination.  If the procedure report does not answer your questions, please call your gastroenterologist to clarify.  If you requested that your care partner not be given the details of your procedure findings, then the procedure report has been included in a sealed envelope for you to review at your convenience later.  YOU SHOULD EXPECT: Some feelings of bloating in the abdomen. Passage of more gas than usual.  Walking can help get rid of the air that was put into your GI tract during the procedure and reduce the bloating. If you had a lower endoscopy (such as a colonoscopy or flexible sigmoidoscopy) you may notice spotting of blood in your stool or on the toilet paper. If you underwent a bowel prep for your procedure, you may not have a normal bowel movement for a few days.  Please Note:  You might notice some irritation and congestion in your nose or some drainage.  This is from the oxygen used during your procedure.  There is no need for concern and it should clear up in a day or so.  SYMPTOMS TO REPORT IMMEDIATELY:   Following lower endoscopy (colonoscopy or flexible sigmoidoscopy):  Excessive amounts of blood in the stool  Significant tenderness or worsening of abdominal pains  Swelling of the abdomen that is new, acute  Fever of 100F or higher   For urgent or emergent issues, a gastroenterologist can be reached at any hour by calling [phone].   DIET: Your first meal following the procedure should be a small meal and then it is ok to progress to your normal diet. Heavy or fried foods are harder to digest and may make you feel nauseous or bloated.  Likewise, meals heavy in dairy and vegetables can increase bloating.  Drink plenty of fluids but you should avoid alcoholic beverages for 24  hours.  ACTIVITY:  You should plan to take it easy for the rest of today and you should NOT DRIVE or use heavy machinery until tomorrow (because of the sedation medicines used during the test).    FOLLOW UP: Our staff will call the number listed on your records the next business day following your procedure to check on you and address any questions or concerns that you may have regarding the information given to you following your procedure. If we do not reach you, we will leave a message.  However, if you are feeling well and you are not experiencing any problems, there is no need to return our call.  We will assume that you have returned to your regular daily activities without incident.  If any biopsies were taken you will be contacted by phone or by letter within the next 1-3 weeks.  Please call us at [phone] if you have not heard about the biopsies in 3 weeks.    SIGNATURES/CONFIDENTIALITY: You and/or your care partner have signed paperwork which will be entered into your electronic medical record.  These signatures attest to the fact that that the information above on your After Visit Summary has been reviewed and is understood.  Full responsibility of the confidentiality of this discharge information lies with you and/or your care-partner.  Polyp handout given

## 2015-10-24 ENCOUNTER — Telehealth: Payer: Self-pay | Admitting: Emergency Medicine

## 2015-10-24 NOTE — Telephone Encounter (Signed)
  Follow up Call-  Call back number 10/23/2015 07/07/2013  Post procedure Call Back phone  # [phone]  Permission to leave phone message Yes Yes     Patient questions:  Do you have a fever, pain , or abdominal swelling? Yes.   Pain Score  4 *  Have you tolerated food without any problems? Yes.    Have you been able to return to your normal activities? Yes.    Do you have any questions about your discharge instructions: Diet   no Medications  No. Follow up visit  No.  Do you have questions or concerns about your Care? Yes.    Actions: * If pain score is 4 or above: Physician/ provider Notified : Harl Bowie, MD.

## 2015-10-24 NOTE — Telephone Encounter (Signed)
Called patient, mild LLQ pain, is tolerating PO well and passing air. She will call with worsening of symptoms

## 2015-10-25 ENCOUNTER — Ambulatory Visit: Payer: Medicare Other | Attending: Gastroenterology | Admitting: Physical Therapy

## 2015-10-25 DIAGNOSIS — M6289 Other specified disorders of muscle: Secondary | ICD-10-CM

## 2015-10-25 DIAGNOSIS — R531 Weakness: Secondary | ICD-10-CM | POA: Diagnosis not present

## 2015-10-25 DIAGNOSIS — N907 Vulvar cyst: Secondary | ICD-10-CM | POA: Insufficient documentation

## 2015-10-25 DIAGNOSIS — N8189 Other female genital prolapse: Secondary | ICD-10-CM

## 2015-10-25 DIAGNOSIS — N8184 Pelvic muscle wasting: Secondary | ICD-10-CM | POA: Diagnosis not present

## 2015-10-25 NOTE — Patient Instructions (Addendum)
About Abdominal Massage  Abdominal massage, also called external colon massage, is a self-treatment circular massage technique that can reduce and eliminate gas and ease constipation. The colon naturally contracts in waves in a clockwise direction starting from inside the right hip, moving up toward the ribs, across the belly, and down inside the left hip.  When you perform circular abdominal massage, you help stimulate your colon's normal wave pattern of movement called peristalsis.  It is most beneficial when done after eating.  Positioning You can practice abdominal massage with oil while lying down, or in the shower with soap.  Some people find that it is just as effective to do the massage through clothing while sitting or standing.  How to Massage Start by placing your finger tips or knuckles on your right side, just inside your hip bone.  . Make small circular movements while you move upward toward your rib cage.   . Once you reach the bottom right side of your rib cage, take your circular movements across to the left side of the bottom of your rib cage.  . Next, move downward until you reach the inside of your left hip bone.  This is the path your feces travel in your colon. . Continue to perform your abdominal massage in this pattern for 10 minutes each day.     You can apply as much pressure as is comfortable in your massage.  Start gently and build pressure as you continue to practice.  Notice any areas of pain as you massage; areas of slight pain may be relieved as you massage, but if you have areas of significant or intense pain, consult with your healthcare provider.  Other Considerations . General physical activity including bending and stretching can have a beneficial massage-like effect on the colon.  Deep breathing can also stimulate the colon because breathing deeply activates the same nervous system that supplies the colon.   . Abdominal massage should always be used in  combination with a bowel-conscious diet that is high in the proper type of fiber for you, fluids (primarily water), and a regular exercise program.   Slow Contraction: Gravity Eliminated (Hook-Lying)    Lie with hips and knees bent. Slowly squeeze pelvic floor for 5___ seconds. Rest for _3__ seconds. Repeat _10__ times. Do _3__ times a day.   Toileting Techniques for Bowel Movements (Defecation) Using your belly (abdomen) and pelvic floor muscles to have a bowel movement is usually instinctive.  Sometimes people can have problems with these muscles and have to relearn proper defecation (emptying) techniques.  If you have weakness in your muscles, organs that are falling out, decreased sensation in your pelvis, or ignore your urge to go, you may find yourself straining to have a bowel movement.  You are straining if you are: . holding your breath or taking in a huge gulp of air and holding it  . keeping your lips and jaw tensed and closed tightly . turning red in the face because of excessive pushing or forcing . developing or worsening your  hemorrhoids . getting faint while pushing . not emptying completely and have to defecate many times a day  If you are straining, you are actually making it harder for yourself to have a bowel movement.  Many people find they are pulling up with the pelvic floor muscles and closing off instead of opening the anus. Due to lack pelvic floor relaxation and coordination the abdominal muscles, one has to work harder to push  the feces out.  Many people have never been taught how to defecate efficiently and effectively.  Notice what happens to your body when you are having a bowel movement.  While you are sitting on the toilet pay attention to the following areas: . Jaw and mouth position . Angle of your hips   . Whether your feet touch the ground or not . Arm placement  . Spine position . Waist . Belly tension . Anus (opening of the anal canal)  An  Evacuation/Defecation Plan   Here are the 4 basic points:  1. Lean forward enough for your elbows to rest on your knees 2. Support your feet on the floor or use a low stool if your feet don't touch the floor  3. Push out your belly as if you have swallowed a beach ball-you should feel a widening of your waist 4. Open and relax your pelvic floor muscles, rather than tightening around the anus      The following conditions my require modifications to your toileting posture:  . If you have had surgery in the past that limits your back, hip, pelvic, knee or ankle flexibility . Constipation   Your healthcare practitioner may make the following additional suggestions and adjustments:  1) Sit on the toilet  a) Make sure your feet are supported. b) Notice your hip angle and spine position-most people find it effective to lean forward or raise their knees, which can help the muscles around the anus to relax  c) When you lean forward, place your forearms on your thighs for support  2) Relax suggestions a) Breath deeply in through your nose and out slowly through your mouth as if you are smelling the flowers and blowing out the candles. b) To become aware of how to relax your muscles, contracting and releasing muscles can be helpful.  Pull your pelvic floor muscles in tightly by using the image of holding back gas, or closing around the anus (visualize making a circle smaller) and lifting the anus up and in.  Then release the muscles and your anus should drop down and feel open. Repeat 5 times ending with the feeling of relaxation. c) Keep your pelvic floor muscles relaxed; let your belly bulge out. d) The digestive tract starts at the mouth and ends at the anal opening, so be sure to relax both ends of the tube.  Place your tongue on the roof of your mouth with your teeth separated.  This helps relax your mouth and will help to relax the anus at the same time.  3) Empty (defecation) a) Keep your  pelvic floor and sphincter relaxed, then bulge your anal muscles.  Make the anal opening wide.  b) Stick your belly out as if you have swallowed a beach ball. c) Make your belly wall hard using your belly muscles while continuing to breathe. Doing this makes it easier to open your anus. d) Breath out and give a grunt (or try using other sounds such as ahhhh, shhhhh, ohhhh or grrrrrrr).  4) Finish a) As you finish your bowel movement, pull the pelvic floor muscles up and in.  This will leave your anus in the proper place rather than remaining pushed out and down. If you leave your anus pushed out and down, it will start to feel as though that is normal and give you incorrect signals about needing to have a bowel movement.    Earlie Counts, PT [facility] [place]  [place],  [id number]   Sitting    Sit comfortably. Allow body's muscles to relax. Place hands on belly. Inhale slowly and deeply for ___ seconds, so hands move out. Then take _3__ seconds to exhale. Repeat _5__ times. Do _3__ times a day.  Copyright  VHI. All rights reserved.

## 2015-10-25 NOTE — Therapy (Signed)
[facility] [address] [place], [place], [address] Phone: [phone]   Fax:  [phone]  Physical Therapy Evaluation  Patient Details  Name: [name] MRN: [id number] Date of Birth: 26-Jun-1941 Referring Provider: Dr. Harl Bowie  Encounter Date: 10/25/2015      PT End of Session - 10/25/15 1253    Visit Number 1   Number of Visits 10  Medicare   Date for PT Re-Evaluation 12/20/15   PT Start Time [id number]   PT Stop Time 1245   PT Time Calculation (min) 60 min   Activity Tolerance Patient tolerated treatment well   Behavior During Therapy [facility] for tasks assessed/performed      Past Medical History  Diagnosis Date  . HYPERPARATHYROIDISM UNSPECIFIED 10/20/2007  . HYPERLIPIDEMIA 07/07/2007  . ANEMIA-NOS 07/07/2007  . HYPERTENSION 02/04/2008  . DIVERTICULOSIS, COLON 02/23/2009  . UTI'S, RECURRENT 11/20/2009    kimbrough   . OSTEOPOROSIS 01/01/2008  . SCOLIOSIS 05/25/2009  . FATIGUE 05/25/2009  . PALPITATIONS, OCCASIONAL 07/12/2008  . URINARY INCONTINENCE 07/07/2007  . ADVERSE REACTION TO MEDICATION 07/31/2009  . COLONIC POLYPS, ADENOMATOUS, HX OF   . NEPHROLITHIASIS, HX OF 11/15/2008  . PARATHYROIDECTOMY 01/02/2009  . Seasonal allergies   . Unspecified constipation   . Other acquired absence of organ     parathyroidectomy  . Fever, unspecified   . Abdominal pain, unspecified site   . Disturbance of skin sensation   . Pain in limb   . Chest pain, unspecified   . Family history of diabetes mellitus   . Family history of malignant neoplasm of breast     Past Surgical History  Procedure Laterality Date  . Right oophorectomy    . Parathyroidectomy      Rt Superior open neck exploration  . Colonoscopy    . Polypectomy      There were no vitals filed for this visit.  Visit Diagnosis:  Perineal floor weakness - Plan: PT plan of care cert/re-cert  Pelvic floor dysfunction - Plan: PT plan of care  cert/re-cert      Subjective Assessment - 10/25/15 1153    Subjective I had a colonoscopy last Monday and waiting for results. Patient is attending therapy for pelvic strengthening. Patient reports bowel and fecal incontinence. Patient reports incontinence in the past 6 months. Patient does not know when it is going to happen.     Patient Stated Goals improve fecal and urinary incontinence.   Currently in Pain? No/denies            [facility] PT Assessment - 10/25/15 0001    Assessment   Medical Diagnosis Pelvic floor dysfunction   Referring Provider Dr. Harl Bowie   Onset Date/Surgical Date 04/19/15   Prior Therapy None   Precautions   Precautions None   Balance Screen   Has the patient fallen in the past 6 months No   Has the patient had a decrease in activity level because of a fear of falling?  No   Is the patient reluctant to leave their home because of a fear of falling?  No   Prior Function   Level of Independence Independent   Vocation Retired   Charity fundraiser Status Within Functional Limits for tasks assessed   Observation/Other Assessments   Observations difficulty with diaphramatic breathing and place most of her air into her ches   Focus on Therapeutic Outcomes (FOTO)  55% limitation CK for Bowel Cnst survey  goal  is 30% CJ   Posture/Postural Control   Posture/Postural Control Postural limitations   Postural Limitations Rounded Shoulders;Forward head;Flexed trunk  scoliosis   AROM   Overall AROM  Within functional limits for tasks performed   PROM   Overall PROM Comments bil. hip external rotation 35 degrees   Strength   Overall Strength Comments bilateral hip strength 4/5                 Pelvic Floor Special Questions - 10/25/15 0001    Prior Pregnancies Yes   Number of Pregnancies 5  2 miscarriages   Number of Vaginal Deliveries 3   Currently Sexually Active No   Urinary Leakage Yes   How often 3 urine  1 time per month    Pad use 3  panty liner   Activities that cause leaking Other;Coughing;Sneezing;Laughing;With strong urge;Lifting  unable to sit through a movie   Urinary urgency Yes  when leakage happens   Urinary frequency 10xper day   Fecal incontinence Yes  explosive diahrrehea 1x/month after constipation   Falling out feeling (prolapse) Yes   Activities that cause feeling of prolapse end of day   Pelvic Floor Internal Exam Patient approves physical threrapist to assess pelvic floor muscle strength and integrity   Exam Type Vaginal                  PT Education - 10/25/15 1247    Education provided Yes   Education Details abdominal massage, toileting technique, pelvic floor contraction, diaphragmatic breathing.    Person(s) Educated Patient   Methods Explanation;Demonstration;Verbal cues;Handout   Comprehension Returned demonstration;Verbalized understanding          PT Short Term Goals - 10/25/15 1301    PT SHORT TERM GOAL #1   Title understand how to perform abdominal massage to assist in managing her constipation   Time 4   Period Weeks   Status New   PT SHORT TERM GOAL #2   Title understand correct breathing pattern so she does not bear down when coughing and laughing   Time 4   Status New   PT SHORT TERM GOAL #3   Title understand what bladder irritants and how they affect the bladder   Time 4   Period Weeks   Status New   PT SHORT TERM GOAL #4   Title understand correct toileting so patient is not straining on the commode   Time 4   Period Weeks   Status New           PT Long Term Goals - 10/25/15 1303    PT LONG TERM GOAL #1   Title independent with HEP and how to progress herself   Time 8   Period Weeks   Status New   PT LONG TERM GOAL #2   Title urinary leakage improved >/= 75% due to increased pelvic floor strength   Time 8   Period Weeks   Status New   PT LONG TERM GOAL #3   Title fecal incontinence decreased >/= 50% due to management of  constipation   Time 8   Period Weeks   Status New               Plan - 10/25/15 1254    Clinical Impression Statement Patient is a 74 year old female with diagnosis of pelvic floor dysfunction. Patient reports fecal and urinary incontinence worse in the past 6 months. Patient wears 3 pantyliners per day.  Patient has  3 episodes of urinary incontinence per day and 1 fecal incontinence  episode per month.  Patient reports fecal incontinence after having constipation then explosive diahrrehea. Patient will have urinary leakage with the urge to void, sneezing and coughing.  Pelvic floor strength is 3/5 and will bear down when she is laughing.  Bilateral hip strength is 4/5.  Patient will benefit from physical therapy to reduce incontinence and improve toileting.    Pt will benefit from skilled therapeutic intervention in order to improve on the following deficits Decreased coordination;Increased muscle spasms;Decreased endurance;Decreased activity tolerance;Decreased strength   Rehab Potential Good   PT Frequency 1x / week   PT Duration 8 weeks   PT Treatment/Interventions ADLs/Self Care Home Management;Moist Heat;Therapeutic exercise;Therapeutic activities;Neuromuscular re-education;Patient/family education;Manual techniques   PT Next Visit Plan pelvic floor strengthening, toileting, hip strength, breathing technique; see if patient has gotten the results back from colonoscopy   PT Home Exercise Plan quick flicks   Recommended Other Services None   Consulted and Agree with Plan of Care Patient          G-Codes - 11-21-2015 1305    Functional Assessment Tool Used FOTO score is 55% limitation CK  goal is 30% limitation CJ   Functional Limitation Other PT primary   Other PT Primary Current Status [id number]) At least 40 percent but less than 60 percent impaired, limited or restricted   Other PT Primary Goal Status [id number]) At least 20 percent but less than 40 percent impaired, limited or  restricted       Problem List Patient Active Problem List   Diagnosis Date Noted  . Dizziness 04/25/2014  . Vertigo 12/26/2012  . Hypertension, uncontrolled 09/11/2012  . Diastolic dysfunction [id number]  . Sleep disturbance 09/17/2011  . Hypotension 12/26/2010  . Hypotension, postural 12/26/2010  . UTI'S, RECURRENT 11/20/2009  . ADVERSE REACTION TO MEDICATION 07/31/2009  . SCOLIOSIS 05/25/2009  . FATIGUE 05/25/2009  . DIVERTICULOSIS, COLON 02/23/2009  . COLONIC POLYPS, ADENOMATOUS, HX OF 02/23/2009  . PARATHYROIDECTOMY 01/02/2009  . NEPHROLITHIASIS, HX OF 11/15/2008  . PALPITATIONS, OCCASIONAL 07/12/2008  . HYPERTENSION 02/04/2008  . CONSTIPATION, CHRONIC 01/01/2008  . LEG PAIN, LEFT 01/01/2008  . OSTEOPOROSIS 01/01/2008  . HYPERPARATHYROIDISM UNSPECIFIED 10/20/2007  . HYPERLIPIDEMIA 07/07/2007  . ANEMIA-NOS 07/07/2007  . URINARY INCONTINENCE 07/07/2007    GRAY,CHERYL,PT Nov 21, 2015, 1:09 PM  [facility] [address], [place], [place], [address] Phone: [phone]   Fax:  [phone]  Name: CLYDIE SCHENKER MRN: [id number] Date of Birth: 07/26/1941

## 2015-10-30 ENCOUNTER — Telehealth: Payer: Self-pay | Admitting: Gastroenterology

## 2015-10-30 NOTE — Telephone Encounter (Signed)
You were considering a referral to the surgeon. Would you review her report and advise me if I need to do this? I have not called her yet.

## 2015-10-31 ENCOUNTER — Other Ambulatory Visit: Payer: Self-pay

## 2015-10-31 DIAGNOSIS — Z01812 Encounter for preprocedural laboratory examination: Secondary | ICD-10-CM

## 2015-10-31 DIAGNOSIS — D126 Benign neoplasm of colon, unspecified: Secondary | ICD-10-CM

## 2015-10-31 MED ORDER — POLYETHYLENE GLYCOL 3350 17 GM/SCOOP PO POWD: ORAL | Status: DC

## 2015-10-31 NOTE — Telephone Encounter (Signed)
I have left message for the patient to call back. Rx for Miralax sent to pharmacy on file, [facility]  She needs to come for labs to check kidney function before 11/07/15 CT abdomen/pelvis is scheduled for 11/07/15, arrive at 12:00 [facility] radiology  Pick up the contrast when she comes in for labs [facility] Dr Marcello Moores on 11/24/15 at 10:00 am. They will mail her information.

## 2015-10-31 NOTE — Telephone Encounter (Signed)
Called patient and informed pathology results, showed tubulovillous adenoma with high-grade dysplasia. Will send referral to surgery and we'll also obtain CT abdomen and pelvis

## 2015-11-01 ENCOUNTER — Other Ambulatory Visit: Payer: Self-pay

## 2015-11-01 NOTE — Telephone Encounter (Signed)
Patient aware and instructed. Written instructions, contrast and an appointment card at the front for her to pick up.

## 2015-11-03 ENCOUNTER — Other Ambulatory Visit (INDEPENDENT_AMBULATORY_CARE_PROVIDER_SITE_OTHER): Payer: Medicare Other

## 2015-11-03 DIAGNOSIS — Z01812 Encounter for preprocedural laboratory examination: Secondary | ICD-10-CM | POA: Diagnosis not present

## 2015-11-03 LAB — CREATININE, SERUM: Creatinine, Ser: 0.76 mg/dL (ref 0.40–1.20)

## 2015-11-03 LAB — BUN: BUN: 15 mg/dL (ref 6–23)

## 2015-11-06 DIAGNOSIS — D1801 Hemangioma of skin and subcutaneous tissue: Secondary | ICD-10-CM | POA: Diagnosis not present

## 2015-11-06 DIAGNOSIS — L821 Other seborrheic keratosis: Secondary | ICD-10-CM | POA: Diagnosis not present

## 2015-11-06 DIAGNOSIS — L738 Other specified follicular disorders: Secondary | ICD-10-CM | POA: Diagnosis not present

## 2015-11-06 DIAGNOSIS — Z85828 Personal history of other malignant neoplasm of skin: Secondary | ICD-10-CM | POA: Diagnosis not present

## 2015-11-06 DIAGNOSIS — D2262 Melanocytic nevi of left upper limb, including shoulder: Secondary | ICD-10-CM | POA: Diagnosis not present

## 2015-11-07 ENCOUNTER — Encounter (HOSPITAL_COMMUNITY): Payer: Self-pay

## 2015-11-07 ENCOUNTER — Telehealth: Payer: Self-pay | Admitting: Gastroenterology

## 2015-11-07 ENCOUNTER — Ambulatory Visit (HOSPITAL_COMMUNITY)
Admission: RE | Admit: 2015-11-07 | Discharge: 2015-11-07 | Disposition: A | Discharge status: Home / Self Care | Payer: Medicare Other | Source: Ambulatory Visit | Attending: Gastroenterology | Admitting: Gastroenterology

## 2015-11-07 DIAGNOSIS — N200 Calculus of kidney: Secondary | ICD-10-CM | POA: Diagnosis not present

## 2015-11-07 DIAGNOSIS — D126 Benign neoplasm of colon, unspecified: Secondary | ICD-10-CM

## 2015-11-07 DIAGNOSIS — K635 Polyp of colon: Secondary | ICD-10-CM | POA: Diagnosis not present

## 2015-11-07 MED ORDER — IOHEXOL 300 MG/ML  SOLN
100.0000 mL | Freq: Once | INTRAMUSCULAR | Status: AC | PRN
  Administered 2015-11-07: 80 mL via INTRAVENOUS

## 2015-11-07 NOTE — Telephone Encounter (Signed)
Discuss titration of Miralax. Okay to use daily. She says she hasn't had a bowel movement in 3 days. She had CT contrast today. Advised of the results. She will keep her appointment with Dr Marcello Moores to discuss the possibility of surgery. See procedure note.

## 2015-11-08 DIAGNOSIS — Z23 Encounter for immunization: Secondary | ICD-10-CM | POA: Diagnosis not present

## 2015-11-09 ENCOUNTER — Encounter: Payer: Self-pay | Admitting: Physical Therapy

## 2015-11-09 ENCOUNTER — Encounter: Payer: Self-pay | Admitting: Geriatric Medicine

## 2015-11-09 ENCOUNTER — Ambulatory Visit: Payer: Medicare Other | Admitting: Physical Therapy

## 2015-11-09 DIAGNOSIS — N907 Vulvar cyst: Secondary | ICD-10-CM | POA: Diagnosis not present

## 2015-11-09 DIAGNOSIS — N8189 Other female genital prolapse: Secondary | ICD-10-CM

## 2015-11-09 DIAGNOSIS — M6289 Other specified disorders of muscle: Secondary | ICD-10-CM

## 2015-11-09 DIAGNOSIS — R531 Weakness: Secondary | ICD-10-CM

## 2015-11-09 DIAGNOSIS — N8184 Pelvic muscle wasting: Secondary | ICD-10-CM | POA: Diagnosis not present

## 2015-11-09 NOTE — Patient Instructions (Signed)
Introduction to Fiber Fiber Overview Dietary fiber is the part of plants that can't be digested. There are 2 kinds of dietary fiber: insoluble and soluble.  Insoluble fiber adds bulk to keep foods moving through the digestive system. Soluble fiber holds water, which softens the stool for easy bowel movements. Fiber is an important part of your diet, even though it passes through your body undigested and has no nutritional value. A high fiber diet can:  . promote regular bowel movements  . treat diverticular disease (inflammation of part of the intestine) and irritable bowel syndrome (abdominal pain, diarrhea, and constipation that come and go) . promote improvement in hemorrhoids, constipation and fecal incontinence  You should have at least 14 grams of fiber for every 1000 calories you eat every day. Read the label on every food package to find out how much fiber a serving of the food will provide. Foods containing more than 20% of the daily value of fiber per serving are considered high in fiber.  Without enough fiber in your diet, you may suffer from:  . constipation  . small, hard, dry bowel movements.   Sources of Fiber Breads, cereals, and pasta made with whole grain flour, and brown rice are high fiber foods. Many breakfast cereals list the bran or fiber content, so it's easy to know which products are high in fiber.  All fruits and vegetables also contain fiber. Dried beans, leafy vegetables, peas, raisins, prunes, apples, and citrus fruits are all especially good sources of fiber. Ask for examples of high-fiber foods (the fiber table and types of fiber handouts) for more resources on fiber.  Additional information on fiber content in foods, is available at www.caloriecounts.com Types of Fiber  There are two main types of fiber:  insoluble and soluble.  Both of these types can prevent and relieve constipation and diarrhea, although some people find one or the other to be more easily  digested.  This handout details information about both types of fiber.  Insoluble Fiber       Functions of Insoluble Fiber . moves bulk through the intestines  . controls and balances the pH (acidity) in the intestines       Benefits of Insoluble Fiber . promotes regular bowel movement and prevents constipation  . removes fecal waste through colon in less time  . keeps an optimal pH in intestines to prevent microbes from producing cancer substances, therefore preventing colon cancer        Food Sources of Insoluble Fiber . whole-wheat products  . wheat bran "miller's bran" . corn bran  . flax seed or other seeds . vegetables such as green beans, broccoli, cauliflower and potato skins  . fruit skins and root vegetable skins  . popcorn . brown rice  Soluble Fiber       Functions of Soluble Fiber  . holds water in the colon to bulk and soften the stool . prolongs stomach emptying time so that sugar is released and absorbed more slowly        Benefits of Soluble Fiber . lowers total cholesterol and LDL cholesterol (the bad cholesterol) therefore reducing the risk of heart disease  . regulates blood sugar for people with diabetes       Food Sources of Soluble Fiber . oat/oat bran . dried beans and peas  . nuts  . barley  . flax seed or other seeds . fruits such as oranges, pears, peaches, and apples  . vegetables such as carrots  .  psyllium husk  . [facility] [address] [place], [place] [address] Phone # [phone] Fax [phone]

## 2015-11-09 NOTE — Therapy (Addendum)
[facility] [address] [place], [place], [address] Phone: [phone]   Fax:  [phone]  Physical Therapy Treatment  Patient Details  Name: [name] MRN: [id number] Date of Birth: 12/26/1940 Referring Provider: Dr. Harl Bowie  Encounter Date: 11/09/2015      PT End of Session - 11/09/15 0933    Visit Number 2   Number of Visits 10  Medicare   Date for PT Re-Evaluation 12/20/15   PT Start Time 0933   PT Stop Time 1012   PT Time Calculation (min) 39 min   Activity Tolerance Patient tolerated treatment well   Behavior During Therapy [facility] for tasks assessed/performed      Past Medical History  Diagnosis Date  . HYPERPARATHYROIDISM UNSPECIFIED 10/20/2007  . HYPERLIPIDEMIA 07/07/2007  . ANEMIA-NOS 07/07/2007  . HYPERTENSION 02/04/2008  . DIVERTICULOSIS, COLON 02/23/2009  . UTI'S, RECURRENT 11/20/2009    kimbrough   . OSTEOPOROSIS 01/01/2008  . SCOLIOSIS 05/25/2009  . FATIGUE 05/25/2009  . PALPITATIONS, OCCASIONAL 07/12/2008  . URINARY INCONTINENCE 07/07/2007  . ADVERSE REACTION TO MEDICATION 07/31/2009  . COLONIC POLYPS, ADENOMATOUS, HX OF   . NEPHROLITHIASIS, HX OF 11/15/2008  . PARATHYROIDECTOMY 01/02/2009  . Seasonal allergies   . Unspecified constipation   . Other acquired absence of organ     parathyroidectomy  . Fever, unspecified   . Abdominal pain, unspecified site   . Disturbance of skin sensation   . Pain in limb   . Chest pain, unspecified   . Family history of diabetes mellitus   . Family history of malignant neoplasm of breast     Past Surgical History  Procedure Laterality Date  . Right oophorectomy    . Parathyroidectomy      Rt Superior open neck exploration  . Colonoscopy    . Polypectomy      There were no vitals filed for this visit.  Visit Diagnosis:  Perineal floor weakness  Pelvic floor dysfunction  Weakness      Subjective Assessment - 11/09/15 0933    Subjective All my tests came back cancer free.  I still have to see the surgeon for the polyp.    Patient Stated Goals improve fecal and urinary incontinence.   Currently in Pain? No/denies                         [facility] Adult PT Treatment/Exercise - 11/09/15 0001    Self-Care   Self-Care Other Self-Care Comments   Other Self-Care Comments  instruction on soluble and insoluble fiber and how it affects the digestive system   Manual Therapy   Manual Therapy Soft tissue mobilization;Myofascial release   Soft tissue mobilization abdominal massage to stimulate intestinal perastalsis and release tightness in tissue    Myofascial Release lower abdomen with one hand on lower abdomen and other hand on sacrum      G-code: FOTO score is 55% limitation; functional limitation other PT primary; goal status is CJ; Discharge status is CK. Earlie Counts, PT 01/04/2016 11:23 AM            PT Education - 11/09/15 1009    Education provided Yes   Education Details soluble and insoluble fiber   Person(s) Educated Patient   Methods Explanation;Handout;Verbal cues   Comprehension Verbalized understanding          PT Short Term Goals - 11/09/15 1012    PT SHORT TERM GOAL #1   Title  understand how to perform abdominal massage to assist in managing her constipation   Time 4   Period Weeks   Status Achieved   PT SHORT TERM GOAL #2   Title understand correct breathing pattern so she does not bear down when coughing and laughing   Time 4   Period Weeks   Status New   PT SHORT TERM GOAL #3   Title understand what bladder irritants and how they affect the bladder   Time 4   Period Weeks   Status New   PT SHORT TERM GOAL #4   Title understand correct toileting so patient is not straining on the commode   Time 4   Period Weeks   Status Achieved           PT Long Term Goals - 10/25/15 1303    PT LONG TERM GOAL #1   Title independent with HEP and how to progress herself    Time 8   Period Weeks   Status New   PT LONG TERM GOAL #2   Title urinary leakage improved >/= 75% due to increased pelvic floor strength   Time 8   Period Weeks   Status New   PT LONG TERM GOAL #3   Title fecal incontinence decreased >/= 50% due to management of constipation   Time 8   Period Weeks   Status New               Plan - 11/09/15 1012    Clinical Impression Statement Patient is a 74 year old female with diagnosis of pelvic floor dysfunction.  Patient has not had a bowel movement in 1 week and is very bloated.  Patient has tightness in abdomen. After therapy patient had gurgling noise in intestines and bloating reduced by 75%.  Patient has met STG #1 and #4.  Patient would benefit from physical therapy to improve continence.    Pt will benefit from skilled therapeutic intervention in order to improve on the following deficits Decreased coordination;Increased muscle spasms;Decreased endurance;Decreased activity tolerance;Decreased strength   Rehab Potential Good   Clinical Impairments Affecting Rehab Potential None   PT Frequency 1x / week   PT Duration 8 weeks   PT Treatment/Interventions ADLs/Self Care Home Management;Moist Heat;Therapeutic exercise;Therapeutic activities;Neuromuscular re-education;Patient/family education;Manual techniques   PT Next Visit Plan pelvic floor strengthening, hip strength, breathing technique; bladder irritants   PT Home Exercise Plan quick flicks   Consulted and Agree with Plan of Care Patient        Problem List Patient Active Problem List   Diagnosis Date Noted  . Dizziness 04/25/2014  . Vertigo 12/26/2012  . Hypertension, uncontrolled 09/11/2012  . Diastolic dysfunction 70/35/0093  . Sleep disturbance 09/17/2011  . Hypotension 12/26/2010  . Hypotension, postural 12/26/2010  . UTI'S, RECURRENT 11/20/2009  . ADVERSE REACTION TO MEDICATION 07/31/2009  . SCOLIOSIS 05/25/2009  . FATIGUE 05/25/2009  . DIVERTICULOSIS,  COLON 02/23/2009  . COLONIC POLYPS, ADENOMATOUS, HX OF 02/23/2009  . PARATHYROIDECTOMY 01/02/2009  . NEPHROLITHIASIS, HX OF 11/15/2008  . PALPITATIONS, OCCASIONAL 07/12/2008  . HYPERTENSION 02/04/2008  . CONSTIPATION, CHRONIC 01/01/2008  . LEG PAIN, LEFT 01/01/2008  . OSTEOPOROSIS 01/01/2008  . HYPERPARATHYROIDISM UNSPECIFIED 10/20/2007  . HYPERLIPIDEMIA 07/07/2007  . ANEMIA-NOS 07/07/2007  . URINARY INCONTINENCE 07/07/2007   Earlie Counts, PT 11/09/2015 10:17 AM   [facility] [address], [place], [place], [address] Phone: [phone]   Fax:  [phone]  Name: TYNISHA OGAN  MRN: [id number] Date of Birth: 1941/07/03  PHYSICAL THERAPY DISCHARGE SUMMARY  Visits from Start of Care: 2 Current functional level related to goals / functional outcomes: See above.  Patient has not returned since 11/09/2015. No phone call for more appointments.    Remaining deficits: See above   Education / Equipment: HEP  Plan: Patient agrees to discharge.  Patient goals were not met. Patient is being discharged due to not returning since the last visit. Thank you for the referral. Earlie Counts, PT 01/04/2016 11:26 AM   ?????

## 2015-11-24 ENCOUNTER — Other Ambulatory Visit: Payer: Self-pay | Admitting: General Surgery

## 2015-11-24 DIAGNOSIS — D122 Benign neoplasm of ascending colon: Secondary | ICD-10-CM | POA: Diagnosis not present

## 2015-11-24 NOTE — H&P (Signed)
History of Present Illness Leighton Ruff MD; [id number] 1:16 PM) Patient words: cecal polyp.  The patient is a 76 year old female who presents with a colonic polyp. 75 year old female who presents to the office with a colon polyp. She was seen by Dr. Silverio Decamp for constipation. A colonoscopy was performed. There was a mass noted at the appendiceal orifice which was biopsied. Biopsy showed adenoma with high-grade dysplasia. There is a concern there may be an invasive component.   Other Problems Mammie Lorenzo, LPN; 624THL D34-534 AM) Anxiety Disorder Bladder Problems High blood pressure Kidney Stone Oophorectomy  Past Surgical History Mammie Lorenzo, LPN; 624THL D34-534 AM) Colon Polyp Removal - Colonoscopy Foot Surgery Right.  Diagnostic Studies History Mammie Lorenzo, LPN; 624THL D34-534 AM) Colonoscopy within last year Mammogram within last year Pap Smear >5 years ago  Allergies Mammie Lorenzo, LPN; 624THL 579FGE AM) Lisinopril *ANTIHYPERTENSIVES* Cough.  Medication History Mammie Lorenzo, LPN; 624THL 624THL AM) Atorvastatin Calcium (20MG  Tablet, Oral) Active. Losartan Potassium (100MG  Tablet, Oral) Active. Aspirin (81MG  Tablet DR, Oral) Active. Vitamin D (Cholecalciferol) (1000UNIT Capsule, Oral) Active. Voltaren (75MG  Tablet DR, Oral as needed) Active. Tylenol PM Extra Strength (500-25MG  Tablet, Oral) Active. Claritin (10MG  Capsule, Oral as needed) Active. Medications Reconciled  Social History Mammie Lorenzo, LPN; 624THL D34-534 AM) Alcohol use Moderate alcohol use. Caffeine use Coffee, Tea. No drug use Tobacco use Never smoker.  Family History Mammie Lorenzo, LPN; 624THL D34-534 AM) Heart Disease Father, Mother. Heart disease in female family member before age 15  Pregnancy / Birth History Mammie Lorenzo, LPN; 624THL D34-534 AM) Age at menarche 44 years. Age of menopause 38-50 Gravida 79 Maternal age 17-25 Para 3 Regular  periods     Review of Systems Mammie Lorenzo LPN; 624THL D34-534 AM) General Present- Weight Loss. Not Present- Appetite Loss, Chills, Fatigue, Fever, Night Sweats and Weight Gain. Skin Not Present- Change in Wart/Mole, Dryness, Hives, Jaundice, New Lesions, Non-Healing Wounds, Rash and Ulcer. HEENT Present- Hearing Loss. Not Present- Earache, Hoarseness, Nose Bleed, Oral Ulcers, Ringing in the Ears, Seasonal Allergies, Sinus Pain, Sore Throat, Visual Disturbances, Wears glasses/contact lenses and Yellow Eyes. Respiratory Present- Chronic Cough. Not Present- Bloody sputum, Difficulty Breathing, Snoring and Wheezing. Breast Not Present- Breast Mass, Breast Pain, Nipple Discharge and Skin Changes. Cardiovascular Not Present- Chest Pain, Difficulty Breathing Lying Down, Leg Cramps, Palpitations, Rapid Heart Rate, Shortness of Breath and Swelling of Extremities. Gastrointestinal Present- Bloating, Constipation and Excessive gas. Not Present- Abdominal Pain, Bloody Stool, Change in Bowel Habits, Chronic diarrhea, Difficulty Swallowing, Gets full quickly at meals, Hemorrhoids, Indigestion, Nausea, Rectal Pain and Vomiting. Female Genitourinary Present- Frequency and Urgency. Not Present- Nocturia, Painful Urination and Pelvic Pain. Musculoskeletal Present- Back Pain. Not Present- Joint Pain, Joint Stiffness, Muscle Pain, Muscle Weakness and Swelling of Extremities. Neurological Not Present- Decreased Memory, Fainting, Headaches, Numbness, Seizures, Tingling, Tremor, Trouble walking and Weakness. Psychiatric Present- Anxiety. Not Present- Bipolar, Change in Sleep Pattern, Depression, Fearful and Frequent crying. Endocrine Not Present- Cold Intolerance, Excessive Hunger, Hair Changes, Heat Intolerance, Hot flashes and New Diabetes. Hematology Not Present- Easy Bruising, Excessive bleeding, Gland problems, HIV and Persistent Infections.  Vitals Claiborne Billings Dockery LPN; 624THL 579FGE AM) 11/24/2015 10:47  AM Weight: 119.2 lb Height: 61in Body Surface Area: 1.52 m Body Mass Index: 22.52 kg/m  Temp.: 98.97F(Oral)  Pulse: 61 (Regular)  BP: 120/76 (Sitting, Left Arm, Standard)      Physical Exam Leighton Ruff MD; [id number] 1:17 PM)  General Mental Status-Alert. General Appearance-Not in acute distress. Build & Nutrition-Well  nourished. Posture-Normal posture. Gait-Normal.  Head and Neck Head-normocephalic, atraumatic with no lesions or palpable masses. Trachea-midline.  Chest and Lung Exam Chest and lung exam reveals -on auscultation, normal breath sounds, no adventitious sounds and normal vocal resonance.  Cardiovascular Cardiovascular examination reveals -normal heart sounds, regular rate and rhythm with no murmurs.  Abdomen Inspection Inspection of the abdomen reveals - No Hernias. Palpation/Percussion Palpation and Percussion of the abdomen reveal - Soft, Non Tender, No Rigidity (guarding), No hepatosplenomegaly and No Palpable abdominal masses.  Neurologic Neurologic evaluation reveals -alert and oriented x 3 with no impairment of recent or remote memory, normal attention span and ability to concentrate, normal sensation and normal coordination.  Musculoskeletal Normal Exam - Bilateral-Upper Extremity Strength Normal and Lower Extremity Strength Normal.    Assessment & Plan Leighton Ruff MD; [id number] 1:19 PM)  ADENOMATOUS POLYP OF ASCENDING COLON (D12.2) Impression: 75 year old female who underwent a colonoscopy for constipation who presents to the office after a single polyp was found at the appendiceal orifice. Biopsy show adenoma with high-grade dysplasia. There is a possible concern for invasive disease. I have recommended a laparoscopic appendectomy to determine the exact pathologic nature, unless Dr Silverio Decamp does not feel this is prudent, given her endoscopic exam impression. The patient understands that she may need further  surgery if this is indeed a cancer. We discussed typical risk of bleeding, infection and dehiscence of the staple line.

## 2015-11-27 ENCOUNTER — Ambulatory Visit: Payer: Medicare Other | Admitting: Physical Therapy

## 2015-12-29 DIAGNOSIS — R768 Other specified abnormal immunological findings in serum: Secondary | ICD-10-CM | POA: Diagnosis not present

## 2015-12-29 DIAGNOSIS — M79672 Pain in left foot: Secondary | ICD-10-CM | POA: Diagnosis not present

## 2015-12-29 DIAGNOSIS — M79642 Pain in left hand: Secondary | ICD-10-CM | POA: Diagnosis not present

## 2015-12-29 DIAGNOSIS — M79641 Pain in right hand: Secondary | ICD-10-CM | POA: Diagnosis not present

## 2015-12-29 DIAGNOSIS — M810 Age-related osteoporosis without current pathological fracture: Secondary | ICD-10-CM | POA: Diagnosis not present

## 2015-12-29 DIAGNOSIS — Z79899 Other long term (current) drug therapy: Secondary | ICD-10-CM | POA: Diagnosis not present

## 2015-12-29 DIAGNOSIS — M79671 Pain in right foot: Secondary | ICD-10-CM | POA: Diagnosis not present

## 2016-01-08 NOTE — Patient Instructions (Signed)
[name]  01/08/2016   Your procedure is scheduled on: January 19, 2016  Report to [facility] Main  Entrance take [facility]  elevators to 3rd floor to  [place] at 9:00  AM.  Call this number if you have problems the morning of surgery [phone]   Remember: ONLY 1 PERSON MAY GO WITH YOU TO SHORT STAY TO GET  READY MORNING OF [place].  Do not eat food or drink liquids :After Midnight.     Take these medicines the morning of surgery with A SIP OF WATER: Isopto Tears if needed DO NOT TAKE ANY DIABETIC MEDICATIONS DAY OF YOUR SURGERY                               You may not have any metal on your body including hair pins and              piercings  Do not wear jewelry, make-up, lotions, powders or perfumes, deodorant             Do not wear nail polish.  Do not shave  48 hours prior to surgery.               Do not bring valuables to the hospital. [place].  Contacts, dentures or bridgework may not be worn into surgery.  Leave suitcase in the car. After su     Patients discharged the day of surgery will not be allowed to drive home.  Name and phone number of your driver:  Special Instructions: coughing and deep breathing exercises, leg exercises              Please read over the following fact sheets you were given: _____________________________________________________________________             [facility] - Preparing for Surgery Before surgery, you can play an important role.  Because skin is not sterile, your skin needs to be as free of germs as possible.  You can reduce the number of germs on your skin by washing with CHG (chlorahexidine gluconate) soap before surgery.  CHG is an antiseptic cleaner which kills germs and bonds with the skin to continue killing germs even after washing. Please DO NOT use if you have an allergy to CHG or antibacterial soaps.  If your skin becomes  reddened/irritated stop using the CHG and inform your nurse when you arrive at Short Stay. Do not shave (including legs and underarms) for at least 48 hours prior to the first CHG shower.  You may shave your face/neck. Please follow these instructions carefully:  1.  Shower with CHG Soap the night before surgery and the  morning of Surgery.  2.  If you choose to wash your hair, wash your hair first as usual with your  normal  shampoo.  3.  After you shampoo, rinse your hair and body thoroughly to remove the  shampoo.                           4.  Use CHG as you would any other liquid soap.  You can apply chg directly  to the skin and wash  Gently with a scrungie or clean washcloth.  5.  Apply the CHG Soap to your body ONLY FROM THE NECK DOWN.   Do not use on face/ open                           Wound or open sores. Avoid contact with eyes, ears mouth and genitals (private parts).                       Wash face,  Genitals (private parts) with your normal soap.             6.  Wash thoroughly, paying special attention to the area where your surgery  will be performed.  7.  Thoroughly rinse your body with warm water from the neck down.  8.  DO NOT shower/wash with your normal soap after using and rinsing off  the CHG Soap.                9.  Pat yourself dry with a clean towel.            10.  Wear clean pajamas.            11.  Place clean sheets on your bed the night of your first shower and do not  sleep with pets. Day of Surgery : Do not apply any lotions/deodorants the morning of surgery.  Please wear clean clothes to the hospital/surgery center.  FAILURE TO FOLLOW THESE INSTRUCTIONS MAY RESULT IN THE CANCELLATION OF YOUR SURGERY PATIENT SIGNATURE_________________________________  NURSE SIGNATURE__________________________________  ________________________________________________________________________

## 2016-01-11 ENCOUNTER — Encounter (HOSPITAL_COMMUNITY): Payer: Self-pay

## 2016-01-11 ENCOUNTER — Encounter (HOSPITAL_COMMUNITY)
Admission: RE | Admit: 2016-01-11 | Discharge: 2016-01-11 | Disposition: A | Discharge status: Home / Self Care | Payer: Medicare Other | Source: Ambulatory Visit | Attending: General Surgery | Admitting: General Surgery

## 2016-01-11 DIAGNOSIS — D120 Benign neoplasm of cecum: Secondary | ICD-10-CM | POA: Diagnosis not present

## 2016-01-11 DIAGNOSIS — Z01812 Encounter for preprocedural laboratory examination: Secondary | ICD-10-CM | POA: Diagnosis not present

## 2016-01-11 HISTORY — DX: Unspecified osteoarthritis, unspecified site: M19.90

## 2016-01-11 HISTORY — DX: Chronic kidney disease, unspecified: N18.9

## 2016-01-11 HISTORY — DX: Cardiac murmur, unspecified: R01.1

## 2016-01-11 LAB — BASIC METABOLIC PANEL
Anion gap: 7 (ref 5–15)
BUN: 17 mg/dL (ref 6–20)
CO2: 27 mmol/L (ref 22–32)
Calcium: 9.4 mg/dL (ref 8.9–10.3)
Chloride: 106 mmol/L (ref 101–111)
Creatinine, Ser: 0.73 mg/dL (ref 0.44–1.00)
GFR calc Af Amer: >60 mL/min (ref >60)
GFR calc non Af Amer: >60 mL/min (ref >60)
Glucose, Bld: 88 mg/dL (ref 65–99)
Potassium: 4.4 mmol/L (ref 3.5–5.1)
Sodium: 140 mmol/L (ref 135–145)

## 2016-01-11 LAB — CBC
HCT: 43.6 % (ref 36.0–46.0)
Hemoglobin: 14.1 g/dL (ref 12.0–15.0)
MCH: 31.3 pg (ref 26.0–34.0)
MCHC: 32.3 g/dL (ref 30.0–36.0)
MCV: 96.7 fL (ref 78.0–100.0)
Platelets: 237 10*3/uL (ref 150–400)
RBC: 4.51 MIL/uL (ref 3.87–5.11)
RDW: 13.3 % (ref 11.5–15.5)
WBC: 5.2 10*3/uL (ref 4.0–10.5)

## 2016-01-11 NOTE — Progress Notes (Signed)
01-11-16 - 10-10-15 - [facility] - Dr. Charlesetta Ivory, PA (gastro) - EPIC 11-07-15 - CT ABD/Pelvis - EPIC 02-28-15 - EKG - EPIC  03-2011 -ECHO - EPIC

## 2016-01-15 ENCOUNTER — Encounter: Payer: Self-pay | Admitting: Gastroenterology

## 2016-01-15 ENCOUNTER — Other Ambulatory Visit (INDEPENDENT_AMBULATORY_CARE_PROVIDER_SITE_OTHER): Payer: Medicare Other

## 2016-01-15 ENCOUNTER — Ambulatory Visit (INDEPENDENT_AMBULATORY_CARE_PROVIDER_SITE_OTHER): Payer: Medicare Other | Admitting: Gastroenterology

## 2016-01-15 VITALS — BP 132/72 | HR 72 | Ht 60.5 in | Wt 118.4 lb

## 2016-01-15 DIAGNOSIS — R140 Abdominal distension (gaseous): Secondary | ICD-10-CM | POA: Diagnosis not present

## 2016-01-15 DIAGNOSIS — K5900 Constipation, unspecified: Secondary | ICD-10-CM

## 2016-01-15 DIAGNOSIS — R159 Full incontinence of feces: Secondary | ICD-10-CM | POA: Diagnosis not present

## 2016-01-15 DIAGNOSIS — K635 Polyp of colon: Secondary | ICD-10-CM | POA: Diagnosis not present

## 2016-01-15 DIAGNOSIS — D126 Benign neoplasm of colon, unspecified: Secondary | ICD-10-CM

## 2016-01-15 LAB — IGA: IgA: 309 mg/dL (ref 68–378)

## 2016-01-15 NOTE — Progress Notes (Signed)
[name]    HO:[id number]    1941-03-25  Primary Care Physician:DEBBIE SCHOENHOFF, MD  Referring Physician: Hoyt Koch, MD [place], [place] [id number]  Chief complaint: Constipation, fecal incontinence, bloating  HPI: 75 year old female with history of chronic constipation and fecal incontinence here for follow-up visit. She underwent colonoscopy in December 2016, had a tubulovillous adenoma with high-grade dysplasia on pathology at the appendiceal orifice removed after lifting it with saline but was unable to completely lift off near the appendiceal orifice with incomplete resection. Patient complains of intermittent bloating and gluten sensitivity, one of her sons is diagnosed with celiac disease and she is wondering if she could have a gluten allergy. She continues to have intermittent constipation and fecal incontinence. She is currently not taking MiraLAX and takes Benefiber as needed. Denies any nausea, vomiting, abdominal pain, melena or bright red blood per rectum     Outpatient Encounter Prescriptions as of 01/15/2016  Medication Sig  . aspirin 81 MG tablet Take 81 mg by mouth daily.    [name] atorvastatin (LIPITOR) 20 MG tablet TAKE 1 TABLET EACH DAY. (Patient taking differently: Take 20 mg by mouth daily. TAKE 1 TABLET EACH DAY.)  . cholecalciferol (VITAMIN D) 1000 UNITS tablet Take 1,000 Units by mouth daily.  . diphenhydramine-acetaminophen (TYLENOL PM) 25-500 MG TABS Take 0.5 tablets by mouth at bedtime as needed (For sleep.).   [name] ibuprofen (ADVIL,MOTRIN) 200 MG tablet Take 200 mg by mouth every 8 (eight) hours as needed for headache or mild pain.  . [DISCONTINUED] hydroxypropyl methylcellulose / hypromellose (ISOPTO TEARS / GONIOVISC) 2.5 % ophthalmic solution Place 1 drop into both eyes daily as needed for dry eyes. Reported on 01/15/2016  . [DISCONTINUED] losartan (COZAAR) 100 MG tablet Take one -half tablet daily (Patient not taking:  Reported on 01/02/2016)   No facility-administered encounter medications on file as of 01/15/2016.    Allergies as of 01/15/2016 - Review Complete 01/15/2016  Allergen Reaction Noted  . Lisinopril Cough 10/05/2009    Past Medical History  Diagnosis Date  . HYPERPARATHYROIDISM UNSPECIFIED 10/20/2007  . HYPERLIPIDEMIA 07/07/2007  . ANEMIA-NOS 07/07/2007  . HYPERTENSION 02/04/2008  . DIVERTICULOSIS, COLON 02/23/2009  . UTI'S, RECURRENT 11/20/2009    kimbrough   . OSTEOPOROSIS 01/01/2008  . SCOLIOSIS 05/25/2009  . FATIGUE 05/25/2009  . PALPITATIONS, OCCASIONAL 07/12/2008  . URINARY INCONTINENCE 07/07/2007  . ADVERSE REACTION TO MEDICATION 07/31/2009  . COLONIC POLYPS, ADENOMATOUS, HX OF   . NEPHROLITHIASIS, HX OF 11/15/2008  . PARATHYROIDECTOMY 01/02/2009  . Seasonal allergies   . Unspecified constipation   . Other acquired absence of organ     parathyroidectomy  . Fever, unspecified   . Abdominal pain, unspecified site   . Disturbance of skin sensation   . Pain in limb   . Chest pain, unspecified   . Family history of diabetes mellitus   . Family history of malignant neoplasm of breast   . Heart murmur     hx of  . Chronic kidney disease     hx of kidney stone  . Arthritis     Past Surgical History  Procedure Laterality Date  . Right oophorectomy    . Parathyroidectomy      Rt Superior open neck exploration  . Colonoscopy    . Polypectomy    . Bunionectomy    . Tonsillectomy      Family History  Problem Relation Age of Onset  .  Breast cancer Other     1st degree relative <50  . Diabetes      1st degree relative  . Sudden death Father 7  . Heart disease Father 21    MI  . Heart disease Mother     valve replacement  . Colon cancer Neg Hx   . Stomach cancer Neg Hx     Social History   Social History  . Marital Status: Married    Spouse Name: N/A  . Number of Children: 5  . Years of Education: N/A   Occupational History  . travel agent    Social History Main  Topics  . Smoking status: Never Smoker   . Smokeless tobacco: Never Used  . Alcohol Use: 3.0 oz/week    5 Glasses of wine per week     Comment: occ. wine  . Drug Use: No  . Sexual Activity: No   Other Topics Concern  . Not on file   Social History Narrative   HH of 5    Kids at home now   Married   travel agent.    2-3 c coffee in am    ocass wine    G3P2vaginal delivery   Pt cell [phone]               Review of systems: Review of Systems  Constitutional: Negative for fever and chills.  HENT: Negative.   Eyes: Negative for blurred vision.  Respiratory: Negative for cough, shortness of breath and wheezing.   Cardiovascular: Negative for chest pain and palpitations.  Gastrointestinal: as per HPI Genitourinary: Negative for dysuria, urgency, frequency and hematuria.  Musculoskeletal: Negative for myalgias, back pain and joint pain.  Skin: Negative for itching and rash.  Neurological: Negative for dizziness, tremors, focal weakness, seizures and loss of consciousness.  Endo/Heme/Allergies: Negative for environmental allergies.  Psychiatric/Behavioral: Negative for depression, suicidal ideas and hallucinations.  All other systems reviewed and are negative.   Physical Exam: Filed Vitals:   01/15/16 1347  BP: 132/72  Pulse: 72   Gen:      No acute distress HEENT:  EOMI, sclera anicteric Neck:     No masses; no thyromegaly Lungs:    Clear to auscultation bilaterally; normal respiratory effort CV:         Regular rate and rhythm; no murmurs Abd:      + bowel sounds; soft, non-tender; no palpable masses, no distension Ext:    No edema; adequate peripheral perfusion Skin:      Warm and dry; no rash Neuro: alert and oriented x 3 Psych: normal mood and affect  Data Reviewed: Reviewed chart and pertinent GI workup   Assessment and Plan/Recommendations: 75 year old female with chronic constipation, fecal and urinary incontinence here for follow-up visit She is  scheduled for appendectomy on March 3 with limited resection of cecum to remove the residual polyp tissue (tubulovillous adenoma with high-grade dysplasia) Start MiraLAX one fourth capful to have capful, titrate as needed We'll check TTG and IgA to rule out celiac disease We'll consider pelvic floor physical therapy at next visit, she is reluctant to undergo this point of time Empiric trial of low fodmap diet Return in 3 months  K. Denzil Magnuson , MD [phone] Mon-Fri 8a-5p [phone] after 5p, weekends, holidays

## 2016-01-15 NOTE — Patient Instructions (Addendum)
Low FODMAP Diet Given Use Miralax 1/4 - 1/2 capful daily titrate as needed  Follow up in 3 months  Go to the basement for labs today

## 2016-01-16 DIAGNOSIS — K635 Polyp of colon: Secondary | ICD-10-CM | POA: Diagnosis not present

## 2016-01-16 DIAGNOSIS — M810 Age-related osteoporosis without current pathological fracture: Secondary | ICD-10-CM | POA: Diagnosis not present

## 2016-01-16 DIAGNOSIS — K529 Noninfective gastroenteritis and colitis, unspecified: Secondary | ICD-10-CM | POA: Diagnosis not present

## 2016-01-16 DIAGNOSIS — I951 Orthostatic hypotension: Secondary | ICD-10-CM | POA: Diagnosis not present

## 2016-01-16 DIAGNOSIS — E213 Hyperparathyroidism, unspecified: Secondary | ICD-10-CM | POA: Diagnosis not present

## 2016-01-16 DIAGNOSIS — N200 Calculus of kidney: Secondary | ICD-10-CM | POA: Diagnosis not present

## 2016-01-16 LAB — TISSUE TRANSGLUTAMINASE, IGA: Tissue Transglutaminase Ab, IgA: 1 U/mL (ref <4)

## 2016-01-19 ENCOUNTER — Encounter (HOSPITAL_COMMUNITY)
Admission: RE | Disposition: A | Discharge status: Home / Self Care | Payer: Self-pay | Source: Ambulatory Visit | Attending: General Surgery

## 2016-01-19 ENCOUNTER — Ambulatory Visit (HOSPITAL_COMMUNITY): Payer: Medicare Other | Admitting: Anesthesiology

## 2016-01-19 ENCOUNTER — Encounter (HOSPITAL_COMMUNITY): Payer: Self-pay

## 2016-01-19 ENCOUNTER — Inpatient Hospital Stay (HOSPITAL_COMMUNITY)
Admission: RE | Admit: 2016-01-19 | Discharge: 2016-01-22 | DRG: 330 | Disposition: A | Discharge status: Home / Self Care | Payer: Medicare Other | Source: Ambulatory Visit | Attending: General Surgery | Admitting: General Surgery

## 2016-01-19 DIAGNOSIS — I4891 Unspecified atrial fibrillation: Secondary | ICD-10-CM | POA: Diagnosis present

## 2016-01-19 DIAGNOSIS — D120 Benign neoplasm of cecum: Principal | ICD-10-CM | POA: Diagnosis present

## 2016-01-19 DIAGNOSIS — E213 Hyperparathyroidism, unspecified: Secondary | ICD-10-CM | POA: Diagnosis present

## 2016-01-19 DIAGNOSIS — K635 Polyp of colon: Secondary | ICD-10-CM | POA: Diagnosis present

## 2016-01-19 DIAGNOSIS — I480 Paroxysmal atrial fibrillation: Secondary | ICD-10-CM | POA: Diagnosis not present

## 2016-01-19 DIAGNOSIS — R05 Cough: Secondary | ICD-10-CM

## 2016-01-19 DIAGNOSIS — I9789 Other postprocedural complications and disorders of the circulatory system, not elsewhere classified: Secondary | ICD-10-CM | POA: Diagnosis not present

## 2016-01-19 DIAGNOSIS — M1990 Unspecified osteoarthritis, unspecified site: Secondary | ICD-10-CM | POA: Diagnosis present

## 2016-01-19 DIAGNOSIS — I129 Hypertensive chronic kidney disease with stage 1 through stage 4 chronic kidney disease, or unspecified chronic kidney disease: Secondary | ICD-10-CM | POA: Diagnosis present

## 2016-01-19 DIAGNOSIS — I10 Essential (primary) hypertension: Secondary | ICD-10-CM | POA: Diagnosis present

## 2016-01-19 DIAGNOSIS — R51 Headache: Secondary | ICD-10-CM | POA: Diagnosis not present

## 2016-01-19 DIAGNOSIS — R059 Cough, unspecified: Secondary | ICD-10-CM

## 2016-01-19 DIAGNOSIS — Z833 Family history of diabetes mellitus: Secondary | ICD-10-CM

## 2016-01-19 DIAGNOSIS — Z888 Allergy status to other drugs, medicaments and biological substances status: Secondary | ICD-10-CM

## 2016-01-19 DIAGNOSIS — Z87442 Personal history of urinary calculi: Secondary | ICD-10-CM

## 2016-01-19 DIAGNOSIS — R001 Bradycardia, unspecified: Secondary | ICD-10-CM | POA: Diagnosis not present

## 2016-01-19 DIAGNOSIS — Z8249 Family history of ischemic heart disease and other diseases of the circulatory system: Secondary | ICD-10-CM

## 2016-01-19 DIAGNOSIS — Z803 Family history of malignant neoplasm of breast: Secondary | ICD-10-CM

## 2016-01-19 DIAGNOSIS — Z86010 Personal history of colonic polyps: Secondary | ICD-10-CM

## 2016-01-19 DIAGNOSIS — E785 Hyperlipidemia, unspecified: Secondary | ICD-10-CM | POA: Diagnosis present

## 2016-01-19 DIAGNOSIS — K5900 Constipation, unspecified: Secondary | ICD-10-CM | POA: Diagnosis present

## 2016-01-19 DIAGNOSIS — M810 Age-related osteoporosis without current pathological fracture: Secondary | ICD-10-CM | POA: Diagnosis present

## 2016-01-19 DIAGNOSIS — R112 Nausea with vomiting, unspecified: Secondary | ICD-10-CM | POA: Diagnosis not present

## 2016-01-19 DIAGNOSIS — I071 Rheumatic tricuspid insufficiency: Secondary | ICD-10-CM | POA: Diagnosis present

## 2016-01-19 DIAGNOSIS — R509 Fever, unspecified: Secondary | ICD-10-CM

## 2016-01-19 DIAGNOSIS — Z7982 Long term (current) use of aspirin: Secondary | ICD-10-CM

## 2016-01-19 DIAGNOSIS — M419 Scoliosis, unspecified: Secondary | ICD-10-CM | POA: Diagnosis present

## 2016-01-19 DIAGNOSIS — Z01812 Encounter for preprocedural laboratory examination: Secondary | ICD-10-CM

## 2016-01-19 DIAGNOSIS — N189 Chronic kidney disease, unspecified: Secondary | ICD-10-CM | POA: Diagnosis present

## 2016-01-19 DIAGNOSIS — Z79899 Other long term (current) drug therapy: Secondary | ICD-10-CM

## 2016-01-19 HISTORY — PX: LAPAROSCOPIC APPENDECTOMY: SHX408

## 2016-01-19 SURGERY — APPENDECTOMY, LAPAROSCOPIC
Anesthesia: General | Site: Abdomen

## 2016-01-19 MED ORDER — LACTATED RINGERS IR SOLN
Status: DC | PRN
  Administered 2016-01-19: 1000 mL

## 2016-01-19 MED ORDER — CEFOTETAN DISODIUM-DEXTROSE 2-2.08 GM-% IV SOLR
INTRAVENOUS | Status: AC
  Filled 2016-01-19: qty 50

## 2016-01-19 MED ORDER — 0.9 % SODIUM CHLORIDE (POUR BTL) OPTIME
TOPICAL | Status: DC | PRN
  Administered 2016-01-19: 1000 mL

## 2016-01-19 MED ORDER — LIDOCAINE HCL (CARDIAC) 20 MG/ML IV SOLN
INTRAVENOUS | Status: AC
  Filled 2016-01-19: qty 5

## 2016-01-19 MED ORDER — ONDANSETRON HCL 4 MG/2ML IJ SOLN
4.0000 mg | Freq: Four times a day (QID) | INTRAMUSCULAR | Status: DC | PRN
  Administered 2016-01-19 – 2016-01-20 (×3): 4 mg via INTRAVENOUS
  Filled 2016-01-19 (×4): qty 2

## 2016-01-19 MED ORDER — ATORVASTATIN CALCIUM 10 MG PO TABS
20.0000 mg | ORAL_TABLET | Freq: Every day | ORAL | Status: DC
  Administered 2016-01-20 – 2016-01-22 (×3): 20 mg via ORAL
  Filled 2016-01-19: qty 2
  Filled 2016-01-19 (×3): qty 1

## 2016-01-19 MED ORDER — ONDANSETRON HCL 4 MG/2ML IJ SOLN
INTRAMUSCULAR | Status: AC
  Filled 2016-01-19: qty 2

## 2016-01-19 MED ORDER — SUGAMMADEX SODIUM 200 MG/2ML IV SOLN
INTRAVENOUS | Status: AC
  Filled 2016-01-19: qty 2

## 2016-01-19 MED ORDER — LIDOCAINE HCL (CARDIAC) 20 MG/ML IV SOLN
INTRAVENOUS | Status: DC | PRN
  Administered 2016-01-19: 50 mg via INTRAVENOUS

## 2016-01-19 MED ORDER — ONDANSETRON HCL 4 MG/2ML IJ SOLN
INTRAMUSCULAR | Status: DC | PRN
  Administered 2016-01-19: 4 mg via INTRAVENOUS

## 2016-01-19 MED ORDER — BUPIVACAINE-EPINEPHRINE 0.5% -1:200000 IJ SOLN
INTRAMUSCULAR | Status: DC | PRN
  Administered 2016-01-19: 24 mL

## 2016-01-19 MED ORDER — IBUPROFEN 200 MG PO TABS
400.0000 mg | ORAL_TABLET | Freq: Four times a day (QID) | ORAL | Status: DC | PRN
  Administered 2016-01-20: 400 mg via ORAL
  Filled 2016-01-19: qty 2

## 2016-01-19 MED ORDER — PROPOFOL 10 MG/ML IV BOLUS
INTRAVENOUS | Status: DC | PRN
  Administered 2016-01-19: 30 mg via INTRAVENOUS
  Administered 2016-01-19: 100 mg via INTRAVENOUS

## 2016-01-19 MED ORDER — SIMETHICONE 80 MG PO CHEW
40.0000 mg | CHEWABLE_TABLET | Freq: Four times a day (QID) | ORAL | Status: DC | PRN
  Filled 2016-01-19: qty 1

## 2016-01-19 MED ORDER — MIDAZOLAM HCL 5 MG/5ML IJ SOLN
INTRAMUSCULAR | Status: DC | PRN
  Administered 2016-01-19: 1 mg via INTRAVENOUS

## 2016-01-19 MED ORDER — FENTANYL CITRATE (PF) 100 MCG/2ML IJ SOLN
INTRAMUSCULAR | Status: AC
  Filled 2016-01-19: qty 2

## 2016-01-19 MED ORDER — MORPHINE SULFATE (PF) 2 MG/ML IV SOLN
2.0000 mg | INTRAVENOUS | Status: DC | PRN
  Administered 2016-01-19 – 2016-01-20 (×4): 2 mg via INTRAVENOUS
  Filled 2016-01-19 (×4): qty 1

## 2016-01-19 MED ORDER — KCL IN DEXTROSE-NACL 20-5-0.45 MEQ/L-%-% IV SOLN
INTRAVENOUS | Status: DC
  Administered 2016-01-19 – 2016-01-22 (×4): via INTRAVENOUS
  Filled 2016-01-19 (×5): qty 1000

## 2016-01-19 MED ORDER — FENTANYL CITRATE (PF) 250 MCG/5ML IJ SOLN
INTRAMUSCULAR | Status: AC
  Filled 2016-01-19: qty 5

## 2016-01-19 MED ORDER — SENNOSIDES-DOCUSATE SODIUM 8.6-50 MG PO TABS
1.0000 | ORAL_TABLET | Freq: Every day | ORAL | Status: DC
  Administered 2016-01-19: 1 via ORAL
  Filled 2016-01-19 (×2): qty 1

## 2016-01-19 MED ORDER — BUPIVACAINE-EPINEPHRINE (PF) 0.25% -1:200000 IJ SOLN
INTRAMUSCULAR | Status: AC
  Filled 2016-01-19: qty 30

## 2016-01-19 MED ORDER — HYDROMORPHONE HCL 1 MG/ML IJ SOLN
INTRAMUSCULAR | Status: AC
  Filled 2016-01-19: qty 1

## 2016-01-19 MED ORDER — BISACODYL 10 MG RE SUPP
10.0000 mg | Freq: Once | RECTAL | Status: AC
  Administered 2016-01-19: 10 mg via RECTAL
  Filled 2016-01-19: qty 1

## 2016-01-19 MED ORDER — HYDROMORPHONE HCL 1 MG/ML IJ SOLN
0.5000 mg | INTRAMUSCULAR | Status: DC | PRN
  Administered 2016-01-19: 0.5 mg via INTRAVENOUS

## 2016-01-19 MED ORDER — ENOXAPARIN SODIUM 40 MG/0.4ML ~~LOC~~ SOLN
40.0000 mg | SUBCUTANEOUS | Status: DC
Start: 2016-01-20 — End: 2016-01-22
  Administered 2016-01-20 – 2016-01-22 (×3): 40 mg via SUBCUTANEOUS
  Filled 2016-01-19 (×4): qty 0.4

## 2016-01-19 MED ORDER — ONDANSETRON 4 MG PO TBDP
4.0000 mg | ORAL_TABLET | Freq: Four times a day (QID) | ORAL | Status: DC | PRN
Start: 2016-01-19 — End: 2016-01-22

## 2016-01-19 MED ORDER — ONDANSETRON HCL 4 MG/2ML IJ SOLN: 4.0000 mg | Freq: Once | INTRAMUSCULAR | Status: DC | PRN

## 2016-01-19 MED ORDER — MIDAZOLAM HCL 2 MG/2ML IJ SOLN
INTRAMUSCULAR | Status: AC
  Filled 2016-01-19: qty 2

## 2016-01-19 MED ORDER — PROPOFOL 10 MG/ML IV BOLUS
INTRAVENOUS | Status: AC
  Filled 2016-01-19: qty 20

## 2016-01-19 MED ORDER — SUGAMMADEX SODIUM 200 MG/2ML IV SOLN
INTRAVENOUS | Status: DC | PRN
  Administered 2016-01-19: 120 mg via INTRAVENOUS

## 2016-01-19 MED ORDER — CEFOTETAN DISODIUM-DEXTROSE 2-2.08 GM-% IV SOLR
2.0000 g | INTRAVENOUS | Status: AC
  Administered 2016-01-19: 2 g via INTRAVENOUS

## 2016-01-19 MED ORDER — HYDROCODONE-ACETAMINOPHEN 5-325 MG PO TABS
1.0000 | ORAL_TABLET | ORAL | Status: DC | PRN
  Filled 2016-01-19 (×2): qty 1

## 2016-01-19 MED ORDER — LACTATED RINGERS IV SOLN
INTRAVENOUS | Status: DC
  Administered 2016-01-19: 1000 mL via INTRAVENOUS
  Administered 2016-01-19: 13:00:00 via INTRAVENOUS

## 2016-01-19 MED ORDER — ROCURONIUM BROMIDE 100 MG/10ML IV SOLN
INTRAVENOUS | Status: DC | PRN
  Administered 2016-01-19: 50 mg via INTRAVENOUS
  Administered 2016-01-19: 5 mg via INTRAVENOUS

## 2016-01-19 MED ORDER — HYDRALAZINE HCL 20 MG/ML IJ SOLN
INTRAMUSCULAR | Status: DC | PRN
  Administered 2016-01-19: 5 mg via INTRAVENOUS

## 2016-01-19 MED ORDER — FENTANYL CITRATE (PF) 100 MCG/2ML IJ SOLN
INTRAMUSCULAR | Status: DC | PRN
  Administered 2016-01-19: 50 ug via INTRAVENOUS
  Administered 2016-01-19: 100 ug via INTRAVENOUS
  Administered 2016-01-19: 50 ug via INTRAVENOUS
  Administered 2016-01-19: 25 ug via INTRAVENOUS

## 2016-01-19 MED ORDER — FENTANYL CITRATE (PF) 100 MCG/2ML IJ SOLN
25.0000 ug | INTRAMUSCULAR | Status: DC | PRN
  Administered 2016-01-19: 50 ug via INTRAVENOUS
  Administered 2016-01-19 (×2): 25 ug via INTRAVENOUS

## 2016-01-19 MED ORDER — DEXAMETHASONE SODIUM PHOSPHATE 10 MG/ML IJ SOLN
INTRAMUSCULAR | Status: DC | PRN
  Administered 2016-01-19: 10 mg via INTRAVENOUS

## 2016-01-19 MED ORDER — ROCURONIUM BROMIDE 100 MG/10ML IV SOLN
INTRAVENOUS | Status: AC
  Filled 2016-01-19: qty 1

## 2016-01-19 SURGICAL SUPPLY — 40 items
APPLIER CLIP 5 13 M/L LIGAMAX5 (MISCELLANEOUS)
APPLIER CLIP ROT 10 11.4 M/L (STAPLE)
APR CLP MED LRG 11.4X10 (STAPLE)
APR CLP MED LRG 5 ANG JAW (MISCELLANEOUS)
BAG SPEC RTRVL LRG 6X4 10 (ENDOMECHANICALS) ×1
CABLE HIGH FREQUENCY MONO STRZ (ELECTRODE) ×3 IMPLANT
CHLORAPREP W/TINT 26ML (MISCELLANEOUS) ×3 IMPLANT
CLIP APPLIE 5 13 M/L LIGAMAX5 (MISCELLANEOUS) IMPLANT
CLIP APPLIE ROT 10 11.4 M/L (STAPLE) IMPLANT
COVER SURGICAL LIGHT HANDLE (MISCELLANEOUS) ×3 IMPLANT
DECANTER SPIKE VIAL GLASS SM (MISCELLANEOUS) ×3 IMPLANT
DRAPE LAPAROSCOPIC ABDOMINAL (DRAPES) ×3 IMPLANT
ELECT REM PT RETURN 9FT ADLT (ELECTROSURGICAL) ×3
ELECTRODE REM PT RTRN 9FT ADLT (ELECTROSURGICAL) ×1 IMPLANT
GLOVE BIO SURGEON STRL SZ 6.5 (GLOVE) ×2 IMPLANT
GLOVE BIO SURGEONS STRL SZ 6.5 (GLOVE) ×1
GLOVE BIOGEL PI IND STRL 7.0 (GLOVE) ×1 IMPLANT
GLOVE BIOGEL PI INDICATOR 7.0 (GLOVE) ×2
GOWN STRL REUS W/TWL 2XL LVL3 (GOWN DISPOSABLE) ×3 IMPLANT
GOWN STRL REUS W/TWL XL LVL3 (GOWN DISPOSABLE) ×3 IMPLANT
HANDLE STAPLE EGIA 4 XL (STAPLE) IMPLANT
KIT BASIN OR (CUSTOM PROCEDURE TRAY) ×3 IMPLANT
LIQUID BAND (GAUZE/BANDAGES/DRESSINGS) ×3 IMPLANT
MARKER SKIN DUAL TIP RULER LAB (MISCELLANEOUS) ×3 IMPLANT
POUCH SPECIMEN RETRIEVAL 10MM (ENDOMECHANICALS) ×3 IMPLANT
RELOAD EGIA 45 MED/THCK PURPLE (STAPLE) IMPLANT
RELOAD EGIA 45 TAN VASC (STAPLE) IMPLANT
RELOAD EGIA 60 MED/THCK PURPLE (STAPLE) ×6 IMPLANT
RELOAD EGIA 60 TAN VASC (STAPLE) ×2 IMPLANT
RELOAD STAPLE 60 MED/THCK ART (STAPLE) IMPLANT
SCISSORS LAP 5X35 DISP (ENDOMECHANICALS) ×3 IMPLANT
SET IRRIG TUBING LAPAROSCOPIC (IRRIGATION / IRRIGATOR) ×3 IMPLANT
SLEEVE XCEL OPT CAN 5 100 (ENDOMECHANICALS) IMPLANT
SUT VIC AB 4-0 PS2 27 (SUTURE) ×3 IMPLANT
TOWEL OR 17X26 10 PK STRL BLUE (TOWEL DISPOSABLE) ×3 IMPLANT
TRAY FOLEY W/METER SILVER 14FR (SET/KITS/TRAYS/PACK) ×3 IMPLANT
TRAY FOLEY W/METER SILVER 16FR (SET/KITS/TRAYS/PACK) ×3 IMPLANT
TRAY LAPAROSCOPIC (CUSTOM PROCEDURE TRAY) ×3 IMPLANT
TROCAR BLADELESS OPT 5 100 (ENDOMECHANICALS) IMPLANT
TROCAR XCEL BLUNT TIP 100MML (ENDOMECHANICALS) ×3 IMPLANT

## 2016-01-19 NOTE — H&P (Signed)
The patient is a 75 year old female who presents with a colonic polyp. She was seen by Dr. Silverio Decamp for constipation. A colonoscopy was performed. There was a mass noted at the appendiceal orifice which was biopsied. Biopsy showed adenoma with high-grade dysplasia. There is a concern there may be an invasive component.   Other Problems Mammie Lorenzo, LPN; 624THL D34-534 AM) Anxiety Disorder Bladder Problems High blood pressure Kidney Stone Oophorectomy  Past Surgical History Mammie Lorenzo, LPN; 624THL D34-534 AM) Colon Polyp Removal - Colonoscopy Foot Surgery Right.  Diagnostic Studies History Mammie Lorenzo, LPN; 624THL D34-534 AM) Colonoscopy within last year Mammogram within last year Pap Smear >5 years ago  Allergies Mammie Lorenzo, LPN; 624THL 579FGE AM) Lisinopril *ANTIHYPERTENSIVES* Cough.  Medication History Mammie Lorenzo, LPN; 624THL 624THL AM) Atorvastatin Calcium (20MG  Tablet, Oral) Active. Losartan Potassium (100MG  Tablet, Oral) Active. Aspirin (81MG  Tablet DR, Oral) Active. Vitamin D (Cholecalciferol) (1000UNIT Capsule, Oral) Active. Voltaren (75MG  Tablet DR, Oral as needed) Active. Tylenol PM Extra Strength (500-25MG  Tablet, Oral) Active. Claritin (10MG  Capsule, Oral as needed) Active. Medications Reconciled  Social History Mammie Lorenzo, LPN; 624THL D34-534 AM) Alcohol use Moderate alcohol use. Caffeine use Coffee, Tea. No drug use Tobacco use Never smoker.  Family History Mammie Lorenzo, LPN; 624THL D34-534 AM) Heart Disease Father, Mother. Heart disease in female family member before age 43  Pregnancy / Birth History Mammie Lorenzo, LPN; 624THL D34-534 AM) Age at menarche 35 years. Age of menopause 3-50 Gravida 32 Maternal age 18-25 Para 3 Regular periods     Review of Systems  General Present- Weight Loss. Not Present- Appetite Loss, Chills, Fatigue, Fever, Night Sweats and Weight Gain. Skin Not  Present- Change in Wart/Mole, Dryness, Hives, Jaundice, New Lesions, Non-Healing Wounds, Rash and Ulcer. HEENT Present- Hearing Loss. Not Present- Earache, Hoarseness, Nose Bleed, Oral Ulcers, Ringing in the Ears, Seasonal Allergies, Sinus Pain, Sore Throat, Visual Disturbances, Wears glasses/contact lenses and Yellow Eyes. Respiratory Present- Chronic Cough. Not Present- Bloody sputum, Difficulty Breathing, Snoring and Wheezing. Breast Not Present- Breast Mass, Breast Pain, Nipple Discharge and Skin Changes. Cardiovascular Not Present- Chest Pain, Difficulty Breathing Lying Down, Leg Cramps, Palpitations, Rapid Heart Rate, Shortness of Breath and Swelling of Extremities. Gastrointestinal Present- Bloating, Constipation and Excessive gas. Not Present- Abdominal Pain, Bloody Stool, Change in Bowel Habits, Chronic diarrhea, Difficulty Swallowing, Gets full quickly at meals, Hemorrhoids, Indigestion, Nausea, Rectal Pain and Vomiting. Female Genitourinary Present- Frequency and Urgency. Not Present- Nocturia, Painful Urination and Pelvic Pain. Musculoskeletal Present- Back Pain. Not Present- Joint Pain, Joint Stiffness, Muscle Pain, Muscle Weakness and Swelling of Extremities. Neurological Not Present- Decreased Memory, Fainting, Headaches, Numbness, Seizures, Tingling, Tremor, Trouble walking and Weakness. Psychiatric Present- Anxiety. Not Present- Bipolar, Change in Sleep Pattern, Depression, Fearful and Frequent crying. Endocrine Not Present- Cold Intolerance, Excessive Hunger, Hair Changes, Heat Intolerance, Hot flashes and New Diabetes. Hematology Not Present- Easy Bruising, Excessive bleeding, Gland problems, HIV and Persistent Infections.  BP 126/69 mmHg  Pulse 59  Temp(Src) 97.8 F (36.6 C) (Oral)  Resp 16  Ht 5' 0.5" (1.537 m)  Wt 53.695 kg (118 lb 6 oz)  BMI 22.73 kg/m2  SpO2 98%    General Mental Status-Alert. General Appearance-Not in acute distress. Build & Nutrition-Well  nourished. Posture-Normal posture. Gait-Normal.  Head and Neck Head-normocephalic, atraumatic with no lesions or palpable masses. Trachea-midline.  Chest and Lung Exam Chest and lung exam reveals -on auscultation, normal breath sounds, no adventitious sounds and normal vocal resonance.  Cardiovascular Cardiovascular examination reveals -  normal heart sounds, regular rate and rhythm with no murmurs.  Abdomen Inspection Inspection of the abdomen reveals - No Hernias. Palpation/Percussion Palpation and Percussion of the abdomen reveal - Soft, Non Tender, No Rigidity (guarding), No hepatosplenomegaly and No Palpable abdominal masses.  Neurologic Neurologic evaluation reveals -alert and oriented x 3 with no impairment of recent or remote memory, normal attention span and ability to concentrate, normal sensation and normal coordination.  Musculoskeletal Normal Exam - Bilateral-Upper Extremity Strength Normal and Lower Extremity Strength Normal.    Assessment & Plan   ADENOMATOUS POLYP OF ASCENDING COLON (D12.2) Impression: 75 year old female who underwent a colonoscopy for constipation who presents to the office after a single polyp was found at the appendiceal orifice. Biopsy show adenoma with high-grade dysplasia. There is a possible concern for invasive disease. I have recommended a laparoscopic appendectomy to determine the exact pathologic nature, unless Dr Silverio Decamp does not feel this is prudent, given her endoscopic exam impression. The patient understands that she may need further surgery if this is indeed a cancer. We discussed typical risk of bleeding, infection and dehiscence of the staple line.

## 2016-01-19 NOTE — Transfer of Care (Signed)
Immediate Anesthesia Transfer of Care Note  Patient: [name]  Procedure(s) Performed: Procedure(s): APPENDECTOMY LAPAROSCOPIC with orifice of cecal polyp  Patient Location: PACU  Anesthesia Type:General  Level of Consciousness: awake and sedated  Airway & Oxygen Therapy: Patient Spontanous Breathing and Patient connected to face mask oxygen  Post-op Assessment: Report given to RN and Post -op Vital signs reviewed and stable  Post vital signs: Reviewed and stable  Last Vitals:  Filed Vitals:   01/19/16 0906  BP: 126/69  Pulse: 59  Temp: 36.6 C  Resp: 16    Complications: No apparent anesthesia complications

## 2016-01-19 NOTE — Anesthesia Preprocedure Evaluation (Addendum)
Anesthesia Evaluation  Patient identified by MRN, date of birth, ID band Patient awake    Reviewed: Allergy & Precautions, NPO status , Patient's Chart, lab work & pertinent test results  Airway Mallampati: III  TM Distance: <3 FB Neck ROM: Full    Dental  (+) Teeth Intact, Dental Advisory Given   Pulmonary neg pulmonary ROS,    Pulmonary exam normal breath sounds clear to auscultation       Cardiovascular hypertension (prior diagnosis, improved with weight loss. no meds currently), Normal cardiovascular exam+ Valvular Problems/Murmurs MR  Rhythm:Regular Rate:Normal  Echo 03/2011: Study Conclusions  - Left ventricle: The cavity size was normal. There was mild focalbasal hypertrophy of the septum. Systolic function was normal. Theestimated ejection fraction was in the range of 60% to 65%. Wallmotion was normal; there were no regional wall motionabnormalities. Features are consistent with a pseudonormal left ventricular filling pattern, with concomitant abnormal relaxationand increased filling pressure (grade 2 diastolic dysfunction).Doppler parameters are consistent with high ventricular fillingpressure. - Mitral valve: Mild regurgitation.    Neuro/Psych negative neurological ROS  negative psych ROS   GI/Hepatic Neg liver ROS, Fecal incontinence   Endo/Other  negative endocrine ROSHyperparathyroidism s/p parathyroidectomy  Renal/GU negative Renal ROS   Urinary incontinence     Musculoskeletal  (+) Arthritis , Osteoarthritis,  Scoliosis    Abdominal   Peds  Hematology  (+) Blood dyscrasia, anemia ,   Anesthesia Other Findings Day of surgery medications reviewed with the patient.  Reproductive/Obstetrics                        Anesthesia Physical Anesthesia Plan  ASA: II  Anesthesia Plan: General   Post-op Pain Management:    Induction: Intravenous  Airway Management Planned:  Oral ETT and Video Laryngoscope Planned  Additional Equipment:   Intra-op Plan:   Post-operative Plan: Extubation in OR  Informed Consent: I have reviewed the patients History and Physical, chart, labs and discussed the procedure including the risks, benefits and alternatives for the proposed anesthesia with the patient or authorized representative who has indicated his/her understanding and acceptance.   Dental advisory given  Plan Discussed with: CRNA  Anesthesia Plan Comments: (Risks/benefits of general anesthesia discussed with patient including risk of damage to teeth, lips, gum, and tongue, nausea/vomiting, allergic reactions to medications, and the possibility of heart attack, stroke and death.  All patient questions answered.  Patient wishes to proceed.)       Anesthesia Quick Evaluation

## 2016-01-19 NOTE — Discharge Instructions (Addendum)
SURGERY: POST OP INSTRUCTIONS °(Surgery for small bowel obstruction, colon resection, etc)  ° ° °DIET °Follow a light diet the first few days at home.  Start with a bland diet such as soups, liquids, starchy foods, low fat foods, etc.  If you feel full, bloated, or constipated, stay on a ful liquid or pureed/blenderized diet for a few days until you feel better and no longer constipated. °Be sure to drink plenty of fluids every day to avoid getting dehydrated (feeling dizzy, not urinating, etc.). °Gradually add a fiber supplement to your diet over the next week.  Gradually get back to a regular solid diet.  Avoid fast food or heavy meals the first week as you are more likely to get nauseated. °It is expected for your digestive tract to need a few months to get back to normal.  It is common for your bowel movements and stools to be irregular.  You will have occasional bloating and cramping that should eventually fade away.  Until you are eating solid food normally, off all pain medications, and back to regular activities; your bowels will not be normal. °Focus on eating a low-fat, high fiber diet the rest of your life (See Getting to Good Bowel Health, below). ° °CARE of your INCISION or WOUND °It is good for closed incision and even open wounds to be washed every day.  Shower every day.  Short baths are fine.  Wash the incisions and wounds clean with soap & water.    °If you have a closed incision(s), wash the incision with soap & water every day.  You may leave closed incisions open to air if it is dry.   You may cover the incision with clean gauze & replace it after your daily shower for comfort. °If you have skin tapes (Steristrips) or skin glue (Dermabond) on your incision, leave them in place.  They will fall off on their own like a scab.  You may trim any edges that curl up with clean scissors.  If you have staples, set up an appointment for them to be removed in the office in 10 days after surgery.  °If you  have a drain, wash around the skin exit site with soap & water and place a new dressing of gauze or band aid around the skin every day.  Keep the drain site clean & dry.    °If you have an open wound with packing, see wound care instructions.  In general, it is encouraged that you remove your dressing and packing, shower with soap & water, and replace your dressing once a day.  Pack the wound with clean gauze moistened with normal (0.9%) saline to keep the wound moist & uninfected.  Pressure on the dressing for 30 minutes will stop most wound bleeding.  Eventually your body will heal & pull the open wound closed over the next few months.  °Raw open wounds will occasionally bleed or secrete yellow drainage until it heals closed.  Drain sites will drain a little until the drain is removed.  Even closed incisions can have mild bleeding or drainage the first few days until the skin edges scab over & seal.   °If you have an open wound with a wound vac, see wound vac care instructions. ° ° ° ° °ACTIVITIES as tolerated °Start light daily activities --- self-care, walking, climbing stairs-- beginning the day after surgery.  Gradually increase activities as tolerated.  Control your pain to be active.  Stop when you   are tired.  Ideally, walk several times a day, eventually an hour a day.   Most people are back to most day-to-day activities in a few weeks.  It takes 4-8 weeks to get back to unrestricted, intense activity. If you can walk 30 minutes without difficulty, it is safe to try more intense activity such as jogging, treadmill, bicycling, low-impact aerobics, swimming, etc. Save the most intensive and strenuous activity for last (Usually 4-8 weeks after surgery) such as sit-ups, heavy lifting, contact sports, etc.  Refrain from any intense heavy lifting or straining until you are off narcotics for pain control.  You will have off days, but things should improve week-by-week. DO NOT PUSH THROUGH PAIN.  Let pain be  your guide: If it hurts to do something, don't do it.  Pain is your body warning you to avoid that activity for another week until the pain goes down. You may drive when you are no longer taking narcotic prescription pain medication, you can comfortably wear a seatbelt, and you can safely make sudden turns/stops to protect yourself without hesitating due to pain. You may have sexual intercourse when it is comfortable. If it hurts to do something, stop.  MEDICATIONS Take your usually prescribed home medications unless otherwise directed.   Blood thinners:  Usually you can restart any strong blood thinners after the second postoperative day.  It is OK to take aspirin right away.     If you are on strong blood thinners (warfarin/Coumadin, Plavix, Xerelto, Eliquis, Pradaxa, etc), discuss with your surgeon, medicine PCP, and/or cardiologist for instructions on when to restart the blood thinner & if blood monitoring is needed (PT/INR blood check, etc).     PAIN CONTROL Pain after surgery or related to activity is often due to strain/injury to muscle, tendon, nerves and/or incisions.  This pain is usually short-term and will improve in a few months.  To help speed the process of healing and to get back to regular activity more quickly, DO THE FOLLOWING THINGS TOGETHER: 1. Increase activity gradually.  DO NOT PUSH THROUGH PAIN 2. Use Ice and/or Heat 3. Try Gentle Massage and/or Stretching 4. Take over the counter pain medication 5. Take Narcotic prescription pain medication for more severe pain  Good pain control = faster recovery.  It is better to take more medicine to be more active than to stay in bed all day to avoid medications. 1.  Increase activity gradually Avoid heavy lifting at first, then increase to lifting as tolerated over the next 6 weeks. Do not push through the pain.  Listen to your body and avoid positions and maneuvers than reproduce the pain.  Wait a few days before trying  something more intense Walking an hour a day is encouraged to help your body recover faster and more safely.  Start slowly and stop when getting sore.  If you can walk 30 minutes without stopping or pain, you can try more intense activity (running, jogging, aerobics, cycling, swimming, treadmill, sex, sports, weightlifting, etc.) Remember: If it hurts to do it, then dont do it! 2. Use Ice and/or Heat You will have swelling and bruising around the incisions.  This will take several weeks to resolve. Ice packs or heating pads (6-8 times a day, 30-60 minutes at a time) will help sooth soreness & bruising. Some people prefer to use ice alone, heat alone, or alternate between ice & heat.  Experiment and see what works best for you.  Consider trying ice for the first  few days to help decrease swelling and bruising; then, switch to heat to help relax sore spots and speed recovery. Shower every day.  Short baths are fine.  It feels good!  Keep the incisions and wounds clean with soap & water.   3. Try Gentle Massage and/or Stretching Massage at the area of pain many times a day Stop if you feel pain - do not overdo it 4. Take over the counter pain medication This helps the muscle and nerve tissues become less irritable and calm down faster Choose ONE of the following over-the-counter anti-inflammatory medications: Acetaminophen '500mg'$  tabs (Tylenol) 1-2 pills with every meal and just before bedtime (avoid if you have liver problems or if you have acetaminophen in you narcotic prescription) Naproxen '220mg'$  tabs (ex. Aleve, Naprosyn) 1-2 pills twice a day (avoid if you have kidney, stomach, IBD, or bleeding problems) Ibuprofen '200mg'$  tabs (ex. Advil, Motrin) 3-4 pills with every meal and just before bedtime (avoid if you have kidney, stomach, IBD, or bleeding problems) Take with food/snack several times a day as directed for at least 2 weeks to help keep pain / soreness down & more manageable. 5. Take Narcotic  prescription pain medication for more severe pain A prescription for strong pain control is often given to you upon discharge (for example: oxycodone/Percocet, hydrocodone/Norco/Vicodin, or tramadol/Ultram) Take your pain medication as prescribed. Be mindful that most narcotic prescriptions contain Tylenol (acetaminophen) as well - avoid taking too much Tylenol. If you are having problems/concerns with the prescription medicine (does not control pain, nausea, vomiting, rash, itching, etc.), please call us [phone] to see if we need to switch you to a different pain medicine that will work better for you and/or control your side effects better. If you need a refill on your pain medication, you must call the office before 4 pm and on weekdays only.  By federal law, prescriptions for narcotics cannot be called into a pharmacy.  They must be filled out on paper & picked up from our office by the patient or authorized caretaker.  Prescriptions cannot be filled after 4 pm nor on weekends.   WHEN TO CALL us [phone] Severe uncontrolled or worsening pain  Fever over 101 F (38.5 C) Concerns with the incision: Worsening pain, redness, rash/hives, swelling, bleeding, or drainage Reactions / problems with new medications (itching, rash, hives, nausea, etc.) Nausea and/or vomiting Difficulty urinating Difficulty breathing Worsening fatigue, dizziness, lightheadedness, blurred vision Other concerns If you are not getting better after two weeks or are noticing you are getting worse, contact our office [phone] for further advice.  We may need to adjust your medications, re-evaluate you in the office, send you to the emergency room, or see what other things we can do to help. The clinic staff is available to answer your questions during regular business hours (8:30am-5pm).  Please dont hesitate to call and ask to speak to one of our nurses for clinical concerns.    A surgeon from [facility] is always on call at the hospitals 24 hours/day If you have a medical emergency, go to the nearest emergency room or call 911. FOLLOW UP in our office One the day of your discharge from the hospital (or the next business weekday), please call [facility] to set up or confirm an appointment to see your surgeon in the office for a follow-up appointment.  Usually it is 2-3 weeks after your surgery.   If you have skin staples at  your incision(s), let the office know so we can set up a time in the office for the nurse to remove them (usually around 10 days after surgery). Make sure that you call for appointments the day of discharge (or the next business weekday) from the hospital to ensure a convenient appointment time. IF YOU HAVE DISABILITY OR FAMILY LEAVE FORMS, BRING THEM TO THE OFFICE FOR PROCESSING.  DO NOT GIVE THEM TO YOUR DOCTOR.  [facility] [address] [place], [place], [place]  [address] ? [phone] - Main [phone] - [place],  [phone] - Fax [email or web address]  GETTING TO GOOD BOWEL HEALTH. It is expected for your digestive tract to need a few months to get back to normal.  It is common for your bowel movements and stools to be irregular.  You will have occasional bloating and cramping that should eventually fade away.  Until you are eating solid food normally, off all pain medications, and back to regular activities; your bowels will not be normal.   Avoiding constipation The goal: ONE SOFT BOWEL MOVEMENT A DAY!    Drink plenty of fluids.  Choose water first. TAKE A FIBER SUPPLEMENT EVERY DAY THE REST OF YOUR LIFE During your first week back home, gradually add back a fiber supplement every day Experiment which form you can tolerate.   There are many forms such as powders, tablets, wafers, gummies, etc Psyllium bran (Metamucil), methylcellulose (Citrucel), Miralax or Glycolax, Benefiber, Flax Seed.    Adjust the dose week-by-week (1/2 dose/day to 6 doses a day) until you are moving your bowels 1-2 times a day.  Cut back the dose or try a different fiber product if it is giving you problems such as diarrhea or bloating. Sometimes a laxative is needed to help jump-start bowels if constipated until the fiber supplement can help regulate your bowels.  If you are tolerating eating & you are farting, it is okay to try a gentle laxative such as double dose MiraLax, prune juice, or Milk of Magnesia.  Avoid using laxatives too often. Stool softeners can sometimes help counteract the constipating effects of narcotic pain medicines.  It can also cause diarrhea, so avoid using for too long. If you are still constipated despite taking fiber daily, eating solids, and a few doses of laxatives, call our office. Controlling diarrhea Try drinking liquids and eating bland foods for a few days to avoid stressing your intestines further. Avoid dairy products (especially milk & ice cream) for a short time.  The intestines often can lose the ability to digest lactose when stressed. Avoid foods that cause gassiness or bloating.  Typical foods include beans and other legumes, cabbage, broccoli, and dairy foods.  Avoid greasy, spicy, fast foods.  Every person has some sensitivity to other foods, so listen to your body and avoid those foods that trigger problems for you. Probiotics (such as active yogurt, Align, etc) may help repopulate the intestines and colon with normal bacteria and calm down a sensitive digestive tract Adding a fiber supplement gradually can help thicken stools by absorbing excess fluid and retrain the intestines to act more normally.  Slowly increase the dose over a few weeks.  Too much fiber too soon can backfire and cause cramping & bloating. It is okay to try and slow down diarrhea with a few doses of antidiarrheal medicines.   Bismuth subsalicylate (ex. Kayopectate, Pepto Bismol) for a few doses can  help control diarrhea.  Avoid if pregnant.  Loperamide (Imodium) can slow down diarrhea.  Start with one tablet ('2mg'$ ) first.  Avoid if you are having fevers or severe pain.  ILEOSTOMY PATIENTS WILL HAVE CHRONIC DIARRHEA since their colon is not in use.    Drink plenty of liquids.  You will need to drink even more glasses of water/liquid a day to avoid getting dehydrated. Record output from your ileostomy.  Expect to empty the bag every 3-4 hours at first.  Most people with a permanent ileostomy empty their bag 4-6 times at the least.   Use antidiarrheal medicine (especially Imodium) several times a day to avoid getting dehydrated.  Start with a dose at bedtime & breakfast.  Adjust up or down as needed.  Increase antidiarrheal medications as directed to avoid emptying the bag more than 8 times a day (every 3 hours). Work with your wound ostomy nurse to learn care for your ostomy.  See ostomy care instructions. TROUBLESHOOTING IRREGULAR BOWELS 1) Start with a soft & bland diet. No spicy, greasy, or fried foods.  2) Avoid gluten/wheat or dairy products from diet to see if symptoms improve. 3) Miralax 17gm or flax seed mixed in [place]. water or juice-daily. May use 2-4 times a day as needed. 4) Gas-X, Phazyme, etc. as needed for gas & bloating.  5) Prilosec (omeprazole) over-the-counter as needed 6)  Consider probiotics (Align, Activa, etc) to help calm the bowels down  Call your doctor if you are getting worse or not getting better.  Sometimes further testing (cultures, endoscopy, X-ray studies, CT scans, bloodwork, etc.) may be needed to help diagnose and treat the cause of the diarrhea. [facility], [place], [place], [place], [place]  [address] [phone] - Main.    [phone]  - Toll Free.   [phone] - Fax [email or web address]  GETTING TO GOOD BOWEL HEALTH. Irregular bowel habits such as constipation and diarrhea can lead to many problems over  time.  Having one soft bowel movement a day is the most important way to prevent further problems.  The anorectal canal is designed to handle stretching and feces to safely manage our ability to get rid of solid waste (feces, poop, stool) out of our body.  BUT, hard constipated stools can act like ripping concrete bricks and diarrhea can be a burning fire to this very sensitive area of our body, causing inflamed hemorrhoids, anal fissures, increasing risk is perirectal abscesses, abdominal pain/bloating, an making irritable bowel worse.      The goal: ONE SOFT BOWEL MOVEMENT A DAY!  To have soft, regular bowel movements:   Drink plenty of fluids, consider 4-6 tall glasses of water a day.    Take plenty of fiber.  Fiber is the undigested part of plant food that passes into the colon, acting s natures broom to encourage bowel motility and movement.  Fiber can absorb and hold large amounts of water. This results in a larger, bulkier stool, which is soft and easier to pass. Work gradually over several weeks up to 6 servings a day of fiber (25g a day even more if needed) in the form of: o Vegetables -- Root (potatoes, carrots, turnips), leafy green (lettuce, salad greens, celery, spinach), or cooked high residue (cabbage, broccoli, etc) o Fruit -- Fresh (unpeeled skin & pulp), Dried (prunes, apricots, cherries, etc ),  or stewed ( applesauce)  o Whole grain breads, pasta, etc (whole wheat)  o Bran cereals   Bulking Agents -- This type of water-retaining fiber generally  is easily obtained each day by one of the following:  o Psyllium bran -- The psyllium plant is remarkable because its ground seeds can retain so much water. This product is available as Metamucil, Konsyl, Effersyllium, Per Diem Fiber, or the less expensive generic preparation in drug and health food stores. Although labeled a laxative, it really is not a laxative.  o Methylcellulose -- This is another fiber derived from wood which also  retains water. It is available as Citrucel. o Polyethylene Glycol - and artificial fiber commonly called Miralax or Glycolax.  It is helpful for people with gassy or bloated feelings with regular fiber o Flax Seed - a less gassy fiber than psyllium  No reading or other relaxing activity while on the toilet. If bowel movements take longer than 5 minutes, you are too constipated  AVOID CONSTIPATION.  High fiber and water intake usually takes care of this.  Sometimes a laxative is needed to stimulate more frequent bowel movements, but   Laxatives are not a good long-term solution as it can wear the colon out.  They can help jump-start bowels if constipated, but should be relied on constantly without discussing with your doctor o Osmotics (Milk of Magnesia, Fleets phosphosoda, Magnesium citrate, MiraLax, GoLytely) are safer than  o Stimulants (Senokot, Castor Oil, Dulcolax, Ex Lax)    o Avoid taking laxatives for more than 7 days in a row.   IF SEVERELY CONSTIPATED, try a Bowel Retraining Program: o Do not use laxatives.  o Eat a diet high in roughage, such as bran cereals and leafy vegetables.  o Drink six (6) ounces of prune or apricot juice each morning.  o Eat two (2) large servings of stewed fruit each day.  o Take one (1) heaping tablespoon of a psyllium-based bulking agent twice a day. Use sugar-free sweetener when possible to avoid excessive calories.  o Eat a normal breakfast.  o Set aside 15 minutes after breakfast to sit on the toilet, but do not strain to have a bowel movement.  o If you do not have a bowel movement by the third day, use an enema and repeat the above steps.   Controlling diarrhea o Switch to liquids and simpler foods for a few days to avoid stressing your intestines further. o Avoid dairy products (especially milk & ice cream) for a short time.  The intestines often can lose the ability to digest lactose when stressed. o Avoid foods that cause gassiness or  bloating.  Typical foods include beans and other legumes, cabbage, broccoli, and dairy foods.  Every person has some sensitivity to other foods, so listen to our body and avoid those foods that trigger problems for you. o Adding fiber (Citrucel, Metamucil, psyllium, Miralax) gradually can help thicken stools by absorbing excess fluid and retrain the intestines to act more normally.  Slowly increase the dose over a few weeks.  Too much fiber too soon can backfire and cause cramping & bloating. o Probiotics (such as active yogurt, Align, etc) may help repopulate the intestines and colon with normal bacteria and calm down a sensitive digestive tract.  Most studies show it to be of mild help, though, and such products can be costly. o Medicines: - Bismuth subsalicylate (ex. Kayopectate, Pepto Bismol) every 30 minutes for up to 6 doses can help control diarrhea.  Avoid if pregnant. - Loperamide (Immodium) can slow down diarrhea.  Start with two tablets ('4mg'$  total) first and then try one tablet every 6 hours.  Avoid if you are having fevers or severe pain.  If you are not better or start feeling worse, stop all medicines and call your doctor for advice o Call your doctor if you are getting worse or not better.  Sometimes further testing (cultures, endoscopy, X-ray studies, bloodwork, etc) may be needed to help diagnose and treat the cause of the diarrhea.  TROUBLESHOOTING IRREGULAR BOWELS 1) Avoid extremes of bowel movements (no bad constipation/diarrhea) 2) Miralax 17gm mixed in 8oz. water or juice-daily. May use BID as needed.  3) Gas-x,Phazyme, etc. as needed for gas & bloating.  4) Soft,bland diet. No spicy,greasy,fried foods.  5) Prilosec over-the-counter as needed  6) May hold gluten/wheat products from diet to see if symptoms improve.  7)  May try probiotics (Align, Activa, etc) to help calm the bowels down 7) If symptoms become worse call back immediately.  Managing Pain  Pain after surgery or  related to activity is often due to strain/injury to muscle, tendon, nerves and/or incisions.  This pain is usually short-term and will improve in a few months.   Many people find it helpful to do the following things TOGETHER to help speed the process of healing and to get back to regular activity more quickly:  1. Avoid heavy physical activity at first a. No lifting greater than 20 pounds at first, then increase to lifting as tolerated over the next few weeks b. Do not push through the pain.  Listen to your body and avoid positions and maneuvers than reproduce the pain.  Wait a few days before trying something more intense c. Walking is okay as tolerated, but go slowly and stop when getting sore.  If you can walk 30 minutes without stopping or pain, you can try more intense activity (running, jogging, aerobics, cycling, swimming, treadmill, sex, sports, weightlifting, etc ) d. Remember: If it hurts to do it, then dont do it!  2. Take Anti-inflammatory medication a. Choose ONE of the following over-the-counter medications: i.            Acetaminophen 500mg  tabs (Tylenol) 1-2 pills with every meal and just before bedtime (avoid if you have liver problems) ii.            Naproxen 220mg  tabs (ex. Aleve) 1-2 pills twice a day (avoid if you have kidney, stomach, IBD, or bleeding problems) iii. Ibuprofen 200mg  tabs (ex. Advil, Motrin) 3-4 pills with every meal and just before bedtime (avoid if you have kidney, stomach, IBD, or bleeding problems) b. Take with food/snack around the clock for 1-2 weeks i. This helps the muscle and nerve tissues become less irritable and calm down faster  3. Use a Heating pad or Ice/Cold Pack a. 4-6 times a day b. May use warm bath/hottub  or showers  4. Try Gentle Massage and/or Stretching  a. at the area of pain many times a day b. stop if you feel pain - do not overdo it  Try these steps together to help you body heal faster and avoid making things get worse.   Doing just one of these things may not be enough.    If you are not getting better after two weeks or are noticing you are getting worse, contact our office for further advice; we may need to re-evaluate you & see what other things we can do to help.  Colon Polyps Polyps are lumps of extra tissue growing inside the body. Polyps can grow in the large intestine (colon). Most colon polyps are  noncancerous (benign). However, some colon polyps can become cancerous over time. Polyps that are larger than a pea may be harmful. To be safe, caregivers remove and test all polyps. CAUSES  Polyps form when mutations in the genes cause your cells to grow and divide even though no more tissue is needed. RISK FACTORS There are a number of risk factors that can increase your chances of getting colon polyps. They include:  Being older than 50 years.  Family history of colon polyps or colon cancer.  Long-term colon diseases, such as colitis or Crohn disease.  Being overweight.  Smoking.  Being inactive.  Drinking too much alcohol. SYMPTOMS  Most small polyps do not cause symptoms. If symptoms are present, they may include:  Blood in the stool. The stool may look dark red or black.  Constipation or diarrhea that lasts longer than 1 week. DIAGNOSIS People often do not know they have polyps until their caregiver finds them during a regular checkup. Your caregiver can use 4 tests to check for polyps:  Digital rectal exam. The caregiver wears gloves and feels inside the rectum. This test would find polyps only in the rectum.  Barium enema. The caregiver puts a liquid called barium into your rectum before taking X-rays of your colon. Barium makes your colon look white. Polyps are dark, so they are easy to see in the X-ray pictures.  Sigmoidoscopy. A thin, flexible tube (sigmoidoscope) is placed into your rectum. The sigmoidoscope has a light and tiny camera in it. The caregiver uses the sigmoidoscope to  look at the last third of your colon.  Colonoscopy. This test is like sigmoidoscopy, but the caregiver looks at the entire colon. This is the most common method for finding and removing polyps. TREATMENT  Any polyps will be removed during a sigmoidoscopy or colonoscopy. The polyps are then tested for cancer. PREVENTION  To help lower your risk of getting more colon polyps:  Eat plenty of fruits and vegetables. Avoid eating fatty foods.  Do not smoke.  Avoid drinking alcohol.  Exercise every day.  Lose weight if recommended by your caregiver.  Eat plenty of calcium and folate. Foods that are rich in calcium include milk, cheese, and broccoli. Foods that are rich in folate include chickpeas, kidney beans, and spinach. HOME CARE INSTRUCTIONS Keep all follow-up appointments as directed by your caregiver. You may need periodic exams to check for polyps. SEEK MEDICAL CARE IF: You notice bleeding during a bowel movement.   This information is not intended to replace advice given to you by your health care provider. Make sure you discuss any questions you have with your health care provider.   Document Released: 07/31/2004 Document Revised: 11/25/2014 Document Reviewed: 01/14/2012 Elsevier Interactive Patient Education 2016 Elsevier Inc.   Atrial Fibrillation Atrial fibrillation is a type of irregular or rapid heartbeat (arrhythmia). In atrial fibrillation, the heart quivers continuously in a chaotic pattern. This occurs when parts of the heart receive disorganized signals that make the heart unable to pump blood normally. This can increase the risk for stroke, heart failure, and other heart-related conditions. There are different types of atrial fibrillation, including:  Paroxysmal atrial fibrillation. This type starts suddenly, and it usually stops on its own shortly after it starts.  Persistent atrial fibrillation. This type often lasts longer than a week. It may stop on its own or  with treatment.  Long-lasting persistent atrial fibrillation. This type lasts longer than 12 months.  Permanent atrial fibrillation. This type does  not go away. Talk with your health care provider to learn about the type of atrial fibrillation that you have. CAUSES This condition is caused by some heart-related conditions or procedures, including:  A heart attack.  Coronary artery disease.  Heart failure.  Heart valve conditions.  High blood pressure.  Inflammation of the sac that surrounds the heart (pericarditis).  Heart surgery.  Certain heart rhythm disorders, such as Wolf-Parkinson-White syndrome. Other causes include:  Pneumonia.  Obstructive sleep apnea.  Blockage of an artery in the lungs (pulmonary embolism, or PE).  Lung cancer.  Chronic lung disease.  Thyroid problems, especially if the thyroid is overactive (hyperthyroidism).  Caffeine.  Excessive alcohol use or illegal drug use.  Use of some medicines, including certain decongestants and diet pills. Sometimes, the cause cannot be found. RISK FACTORS This condition is more likely to develop in:  People who are older in age.  People who smoke.  People who have diabetes mellitus.  People who are overweight (obese).  Athletes who exercise vigorously. SYMPTOMS Symptoms of this condition include:  A feeling that your heart is beating rapidly or irregularly.  A feeling of discomfort or pain in your chest.  Shortness of breath.  Sudden light-headedness or weakness.  Getting tired easily during exercise. In some cases, there are no symptoms. DIAGNOSIS Your health care provider may be able to detect atrial fibrillation when taking your pulse. If detected, this condition may be diagnosed with:  An electrocardiogram (ECG).  A Holter monitor test that records your heartbeat patterns over a 24-hour period.  Transthoracic echocardiogram (TTE) to evaluate how blood flows through your  heart.  Transesophageal echocardiogram (TEE) to view more detailed images of your heart.  A stress test.  Imaging tests, such as a CT scan or chest X-ray.  Blood tests. TREATMENT The main goals of treatment are to prevent blood clots from forming and to keep your heart beating at a normal rate and rhythm. The type of treatment that you receive depends on many factors, such as your underlying medical conditions and how you feel when you are experiencing atrial fibrillation. This condition may be treated with:  Medicine to slow down the heart rate, bring the heart's rhythm back to normal, or prevent clots from forming.  Electrical cardioversion. This is a procedure that resets your heart's rhythm by delivering a controlled, low-energy shock to the heart through your skin.  Different types of ablation, such as catheter ablation, catheter ablation with pacemaker, or surgical ablation. These procedures destroy the heart tissues that send abnormal signals. When the pacemaker is used, it is placed under your skin to help your heart beat in a regular rhythm. HOME CARE INSTRUCTIONS  Take over-the counter and prescription medicines only as told by your health care provider.  If your health care provider prescribed a blood-thinning medicine (anticoagulant), take it exactly as told. Taking too much blood-thinning medicine can cause bleeding. If you do not take enough blood-thinning medicine, you will not have the protection that you need against stroke and other problems.  Do not use tobacco products, including cigarettes, chewing tobacco, and e-cigarettes. If you need help quitting, ask your health care provider.  If you have obstructive sleep apnea, manage your condition as told by your health care provider.  Do not drink alcohol.  Do not drink beverages that contain caffeine, such as coffee, soda, and tea.  Maintain a healthy weight. Do not use diet pills unless your health care provider  approves. Diet pills may  make heart problems worse.  Follow diet instructions as told by your health care provider.  Exercise regularly as told by your health care provider.  Keep all follow-up visits as told by your health care provider. This is important. PREVENTION  Avoid drinking beverages that contain caffeine or alcohol.  Avoid certain medicines, especially medicines that are used for breathing problems.  Avoid certain herbs and herbal medicines, such as those that contain ephedra or ginseng.  Do not use illegal drugs, such as cocaine and amphetamines.  Do not smoke.  Manage your high blood pressure. SEEK MEDICAL CARE IF:  You notice a change in the rate, rhythm, or strength of your heartbeat.  You are taking an anticoagulant and you notice increased bruising.  You tire more easily when you exercise or exert yourself. SEEK IMMEDIATE MEDICAL CARE IF:  You have chest pain, abdominal pain, sweating, or weakness.  You feel nauseous.  You notice blood in your vomit, bowel movement, or urine.  You have shortness of breath.  You suddenly have swollen feet and ankles.  You feel dizzy.  You have sudden weakness or numbness of the face, arm, or leg, especially on one side of the body.  You have trouble speaking, trouble understanding, or both (aphasia).  Your face or your eyelid droops on one side. These symptoms may represent a serious problem that is an emergency. Do not wait to see if the symptoms will go away. Get medical help right away. Call your local emergency services (911 in the [place] Do not drive yourself to the hospital.   This information is not intended to replace advice given to you by your health care provider. Make sure you discuss any questions you have with your health care provider.   Document Released: 11/04/2005 Document Revised: 07/26/2015 Document Reviewed: 03/01/2015 Elsevier Interactive Patient Education [employer].

## 2016-01-19 NOTE — Anesthesia Postprocedure Evaluation (Signed)
Anesthesia Post Note  Patient: [name]  Procedure(s) Performed: Procedure(s): APPENDECTOMY LAPAROSCOPIC with orifice of cecal polyp  Patient location during evaluation: PACU Anesthesia Type: General Level of consciousness: awake and alert Pain management: pain level controlled Vital Signs Assessment: post-procedure vital signs reviewed and stable Respiratory status: spontaneous breathing, nonlabored ventilation, respiratory function stable and patient connected to nasal cannula oxygen Cardiovascular status: blood pressure returned to baseline and stable Postop Assessment: no signs of nausea or vomiting Anesthetic complications: no    Last Vitals:  Filed Vitals:   01/19/16 1430 01/19/16 1445  BP: 153/77 180/87  Pulse: 84 79  Temp:  36.8 C  Resp: 23 15    Last Pain:  Filed Vitals:   01/19/16 1458  PainSc: 4                  Jadon Ressler A

## 2016-01-19 NOTE — Op Note (Signed)
[name] [id number]   PRE-OPERATIVE DIAGNOSIS:  CECAL POLYP  POST-OPERATIVE DIAGNOSIS:  cecal polyp   Procedure(s): APPENDECTOMY LAPAROSCOPIC with partial cecectomy  SURGEON:  Surgeon(s): Leighton Ruff, MD  ASSISTANT: none   ANESTHESIA:   local and general  EBL:   min  Delay start of Pharmacological VTE agent (>24hrs) due to surgical blood loss or risk of bleeding:  no  DRAINS: none   SPECIMEN:  Source of Specimen:  appendix  DISPOSITION OF SPECIMEN:  PATHOLOGY  COUNTS:  YES  PLAN OF CARE: Admit for overnight observation  PATIENT DISPOSITION:  PACU - hemodynamically stable.   INDICATIONS: Patient with concerning symptoms & work up suspicious for appendicitis.  Surgery was recommended:  The anatomy & physiology of the digestive tract was discussed.  The pathophysiology of appendicitis was discussed.  Natural history risks without surgery was discussed.   I feel the risks of no intervention will lead to serious problems that outweigh the operative risks; therefore, I recommended diagnostic laparoscopy with removal of appendix to remove the pathology.  Laparoscopic & open techniques were discussed.   I noted a good likelihood this will help address the problem.    Risks such as bleeding, infection, abscess, leak, reoperation, possible ostomy, hernia, heart attack, death, and other risks were discussed.  Goals of post-operative recovery were discussed as well.  We will work to minimize complications.  Questions were answered.  The patient expresses understanding & wishes to proceed with surgery.  OR FINDINGS: 75 y.o. F with appendiceal orifice polyp, endoscopically unresectable  DESCRIPTION:   The patient was identified & brought into the operating room. The patient was positioned supine with left arm tucked. SCDs were active during the entire case. The patient underwent general anesthesia without any difficulty.  A foley catheter was inserted under sterile conditions.  The abdomen was prepped and draped in a sterile fashion. A Surgical Timeout confirmed our plan.   I made a transverse incision through the superior umbilical fold.  I made a nick in the infraumbilical fascia and confirmed peritoneal entry.  I placed a stay suture and then the [facility] port.  We induced carbon dioxide insufflation.  Camera inspection revealed no injury.  I placed additional ports under direct laparoscopic visualization.    I elevated the cecum. I freed the appendix off its attachments to the ascending colon and cecal mesentery.  I elevated the appendix.  I was able to free off the base of the appendix, which was viable.  I stapled the appendix and surrounding ~1 cm of cecum using a laparoscopic bowel load stapler. I skeletonized & ligated the mesoappendix with a vascular load stapler.   I placed the appendix inside an EndoCatch bag and removed out the [place] port.  I inspected the appendix and confirmed complete polypectomy.  I did copious irrigation. Hemostasis was good in the mesoappendix, colon mesentery, and retroperitoneum. Staple line was intact on the cecum with no bleeding. I washed out the pelvis, retrohepatic space and right paracolic gutter.  Hemostasis is good. There was no perforation or injury.   I aspirated the carbon dioxide. I removed the ports. I closed the umbilical fascia site using a 0 Vicryl stitch. I closed skin using 4-0 vicryl stitch.  Sterile dressings were applied.  Patient was extubated and sent to the recovery room.  I discussed the operative findings with the patient's family.  Questions answered. They expressed understanding and appreciation.

## 2016-01-20 MED ORDER — ACETAMINOPHEN 500 MG PO TABS: 1000.0000 mg | ORAL_TABLET | Freq: Three times a day (TID) | ORAL | Status: DC

## 2016-01-20 MED ORDER — ACETAMINOPHEN 160 MG/5ML PO SOLN: 250.0000 mg | Freq: Every day | ORAL | Status: DC

## 2016-01-20 MED ORDER — LACTATED RINGERS IV BOLUS (SEPSIS): 1000.0000 mL | Freq: Three times a day (TID) | INTRAVENOUS | Status: AC | PRN

## 2016-01-20 MED ORDER — PROMETHAZINE HCL 25 MG/ML IJ SOLN: 12.5000 mg | Freq: Three times a day (TID) | INTRAMUSCULAR | Status: DC | PRN

## 2016-01-20 MED ORDER — PROMETHAZINE HCL 25 MG/ML IJ SOLN
12.5000 mg | Freq: Four times a day (QID) | INTRAMUSCULAR | Status: DC | PRN
  Administered 2016-01-20: 12.5 mg via INTRAVENOUS
  Filled 2016-01-20: qty 1

## 2016-01-20 MED ORDER — IBUPROFEN 200 MG PO TABS: 400.0000 mg | ORAL_TABLET | Freq: Four times a day (QID) | ORAL | Status: DC | PRN

## 2016-01-20 MED ORDER — PROMETHAZINE HCL 25 MG/ML IJ SOLN
12.5000 mg | INTRAMUSCULAR | Status: DC | PRN
  Administered 2016-01-21: 12.5 mg via INTRAVENOUS
  Filled 2016-01-20: qty 1

## 2016-01-20 MED ORDER — IBUPROFEN 200 MG PO TABS
600.0000 mg | ORAL_TABLET | Freq: Four times a day (QID) | ORAL | Status: DC
  Administered 2016-01-20 – 2016-01-21 (×4): 600 mg via ORAL
  Filled 2016-01-20: qty 1
  Filled 2016-01-20: qty 3
  Filled 2016-01-20 (×2): qty 1
  Filled 2016-01-20: qty 3
  Filled 2016-01-20 (×3): qty 1

## 2016-01-20 MED ORDER — DIPHENHYDRAMINE HCL 12.5 MG/5ML PO ELIX: 12.5000 mg | ORAL_SOLUTION | Freq: Every evening | ORAL | Status: DC | PRN

## 2016-01-20 MED ORDER — METHOCARBAMOL 1000 MG/10ML IJ SOLN
1000.0000 mg | Freq: Four times a day (QID) | INTRAVENOUS | Status: DC | PRN
  Filled 2016-01-20: qty 10

## 2016-01-20 MED ORDER — DIPHENHYDRAMINE HCL 12.5 MG/5ML PO ELIX: 12.5000 mg | ORAL_SOLUTION | Freq: Every day | ORAL | Status: DC

## 2016-01-20 MED ORDER — LACTATED RINGERS IV BOLUS (SEPSIS)
1000.0000 mL | Freq: Once | INTRAVENOUS | Status: AC
  Administered 2016-01-20: 1000 mL via INTRAVENOUS

## 2016-01-20 MED ORDER — ASPIRIN EC 81 MG PO TBEC
81.0000 mg | DELAYED_RELEASE_TABLET | Freq: Every day | ORAL | Status: DC
  Administered 2016-01-20 – 2016-01-22 (×3): 81 mg via ORAL
  Filled 2016-01-20 (×3): qty 1

## 2016-01-20 MED ORDER — ACETAMINOPHEN 500 MG PO TABS: 500.0000 mg | ORAL_TABLET | Freq: Four times a day (QID) | ORAL | Status: DC | PRN

## 2016-01-20 MED ORDER — DIPHENHYDRAMINE-APAP (SLEEP) 25-500 MG PO TABS: 0.5000 | ORAL_TABLET | Freq: Every evening | ORAL | Status: DC | PRN

## 2016-01-20 MED ORDER — BISACODYL 10 MG RE SUPP
10.0000 mg | Freq: Every day | RECTAL | Status: DC
  Administered 2016-01-21 – 2016-01-22 (×2): 10 mg via RECTAL
  Filled 2016-01-20 (×2): qty 1

## 2016-01-20 MED ORDER — ACETAMINOPHEN 160 MG/5ML PO SOLN: 250.0000 mg | Freq: Every evening | ORAL | Status: DC | PRN

## 2016-01-20 MED ORDER — TRAMADOL HCL 50 MG PO TABS
50.0000 mg | ORAL_TABLET | Freq: Four times a day (QID) | ORAL | Status: DC | PRN
  Administered 2016-01-21: 50 mg via ORAL
  Filled 2016-01-20: qty 1

## 2016-01-20 NOTE — Progress Notes (Signed)
[facility]  [address] [place], [place] [id number] Phone: [phone] FAX: [place] [id number] Apr 29, 1941   Assessment  Problem List:   Active Problems:   Colon polyp   1 Day Post-Op  01/19/2016  Procedure(s): APPENDECTOMY LAPAROSCOPIC with orifice of cecal polyp    Some nausea and confusion most likely related to anesthesia.  Plan:  Switch narcotic pain medications around.  Increase nausea control.  Check orthostatic vital signs.  Ambulate.  Follow up pathology.  Not ready for discharge today.[name]  Hopefully a day or 2.  -VTE prophylaxis- SCDs, etc  -mobilize as tolerated to help recovery  Adin Hector, M.D., F.A.C.S. Gastrointestinal and Minimally Invasive Surgery [facility] [address] [place], [place] [address] [phone] Main / Paging   01/20/2016  Subjective:  Confused last night.  Still a little confused today.  Thought she had surgery 2 days ago.  Nauseated especially last night.  A little better this morning.  Family at bedside.  Nurse [facility] bedside giving Phenergan.  Patient notes that usually helps.  Objective:  Vital signs:  Filed Vitals:   01/19/16 2000 01/20/16 0231 01/20/16 0543 01/20/16 0958  BP: 145/77 180/77 150/60 125/61  Pulse: 90 76 77 81  Temp: 98.2 F (36.8 C) 98.2 F (36.8 C) 98.5 F (36.9 C) 98.9 F (37.2 C)  TempSrc: Oral Oral Oral Oral  Resp: 16 16 16 16   Height:      Weight:      SpO2: 98% 97% 96% 95%    Last BM Date: 01/19/16  Intake/Output   Yesterday:  03/03 0701 - 03/04 0700 In: 1800 [I.V.:1800] Out: 1475 [Urine:1425; Blood:50] This shift:     Bowel function:  Flatus: y  BM: y  Drain: n/a  Physical Exam:  General: Pt awake/alert/oriented x4 in no acute distress.Eyes: PERRL, normal EOM.  Sclera clear.  No icterus Neuro: CN II-XII intact w/o focal sensory/motor deficits. Lymph: No  head/neck/groin lymphadenopathy Psych:  No delerium/psychosis/paranoia.  Mildly confused at first but pleasantly so.   HENT: Normocephalic, Mucus membranes moist.  No thrush.  Likes cool washcloth on her forehead  Neck: Supple, No tracheal deviation Chest: No chest wall pain w good excursion CV:  Pulses intact.  Regular rhythm MS: Normal AROM mjr joints.  No obvious deformity Abdomen: Soft.  Nondistended.  Minimally tender at umbilical incision only.  No evidence of peritonitis.  No incarcerated hernias. Ext:  SCDs BLE.  No mjr edema.  No cyanosis Skin: No petechiae / purpura  Results:   Labs: No results found for this or any previous visit (from the past 48 hour(s)).  Imaging / Studies: No results found.  Medications / Allergies: per chart  Antibiotics: Anti-infectives    Start     Dose/Rate Route Frequency Ordered Stop   01/19/16 1000  cefoTEtan in Dextrose 5% (CEFOTAN) IVPB 2 g     2 g Intravenous On call to O.R. 01/19/16 0908 01/19/16 1200        Note: Portions of this report may have been transcribed using voice recognition software. Every effort was made to ensure accuracy; however, inadvertent computerized transcription errors may be present.   Any transcriptional errors that result from this process are unintentional.     Adin Hector, M.D., F.A.C.S. Gastrointestinal and Minimally Invasive Surgery [facility] [address], [place], [place] [address] [phone] Main / Paging  01/20/2016  CARE TEAM:  PCP: Kelton Pillar, MD  Outpatient Care Team: Patient Care Team: Lanice Shirts, MD as PCP - General (Internal Medicine) Larey Dresser, MD (Cardiology) Amy Martinique, MD (Dermatology) [facility] Grant Fontana., MD (Urology)  Inpatient Treatment Team: Treatment Team: Attending Provider: Leighton Ruff, MD; Registered Nurse: Vicente Serene, RN; Technician: Abbe Amsterdam, NT; Technician: Sueanne Margarita, NT;  Registered Nurse: Janifer Adie, RN

## 2016-01-20 NOTE — Evaluation (Signed)
Physical Therapy Evaluation Patient Details Name: [name] MRN: [id number] DOB: 04-04-1941 Today's Date: 01/20/2016   History of Present Illness  Laparoscopic Appendectomy with partial cdcectomy 01/19/16  Clinical Impression  Pt s/p above procedure and presenting with functional mobility limitations 2* mild ambulatory instability and post op confusion with impulsivity and questionable safety awareness.  Pt should progress to dc home with family assist and would benefit from [place] for home use.    Follow Up Recommendations No PT follow up    Equipment Recommendations  Rolling walker with 5" wheels (Youth level RW please.)    Recommendations for Other Services OT consult     Precautions / Restrictions Precautions Precautions: Fall Restrictions Weight Bearing Restrictions: No      Mobility  Bed Mobility Overal bed mobility: Modified Independent             General bed mobility comments: Pt unassisted in/out of bed  Transfers Overall transfer level: Needs assistance Equipment used: None Transfers: Sit to/from Stand Sit to Stand: Min guard         General transfer comment: min guard to steady on initial standing and VC for saftey awareness with transition  Ambulation/Gait Ambulation/Gait assistance: Min assist;Min guard Ambulation Distance (Feet): 800 Feet Assistive device: Rolling walker (2 wheeled);1 person hand held assist Gait Pattern/deviations: Step-through pattern;Shuffle Gait velocity: mod pace   General Gait Details: 300 HHA with intermittent mild instability - largely corrected with use of  RW for additional 500'.  Cues for position from [employer]            Wheelchair Mobility    Modified Rankin (Stroke Patients Only)       Balance Overall balance assessment: Needs assistance Sitting-balance support: No upper extremity supported;Feet supported Sitting balance-Leahy Scale: Good     Standing balance support: No upper extremity  supported Standing balance-Leahy Scale: Fair                               Pertinent Vitals/Pain Pain Assessment: No/denies pain    Home Living Family/patient expects to be discharged to:: Private residence Living Arrangements: Spouse/significant other Available Help at Discharge: Family Type of Home: House Home Access: Level entry     Home Layout: One level Home Equipment: None      Prior Function Level of Independence: Independent               Hand Dominance        Extremity/Trunk Assessment   Upper Extremity Assessment: Overall WFL for tasks assessed           Lower Extremity Assessment: Overall WFL for tasks assessed      Cervical / Trunk Assessment: Normal  Communication   Communication: No difficulties  Cognition Arousal/Alertness: Awake/alert Behavior During Therapy: Impulsive Overall Cognitive Status: Impaired/Different from baseline Area of Impairment: Safety/judgement;Orientation Orientation Level: Situation       Safety/Judgement: Decreased awareness of safety;Decreased awareness of deficits          General Comments      Exercises        Assessment/Plan    PT Assessment Patient needs continued PT services  PT Diagnosis Difficulty walking   PT Problem List Decreased balance;Decreased mobility;Decreased knowledge of use of DME;Decreased safety awareness  PT Treatment Interventions DME instruction;Gait training;Stair training;Functional mobility training;Therapeutic activities;Therapeutic exercise;Balance training;Patient/family education   PT Goals (Current goals can be found in the Care Plan section) Acute  Rehab PT Goals Patient Stated Goal: Home PT Goal Formulation: With patient Time For Goal Achievement: 01/27/16 Potential to Achieve Goals: Fair    Frequency Min 3X/week   Barriers to discharge        Co-evaluation               End of Session   Activity Tolerance: Patient tolerated treatment  well Patient left: in bed;with call bell/phone within reach;with family/visitor present Nurse Communication: Mobility status         Time: [id number] PT Time Calculation (min) (ACUTE ONLY): 28 min   Charges:   PT Evaluation $PT Eval Low Complexity: 1 Procedure PT Treatments $Gait Training: 8-22 mins   PT G Codes:        Dyesha Henault 02/14/16, 4:17 PM

## 2016-01-21 DIAGNOSIS — I9789 Other postprocedural complications and disorders of the circulatory system, not elsewhere classified: Secondary | ICD-10-CM | POA: Diagnosis not present

## 2016-01-21 DIAGNOSIS — Z7982 Long term (current) use of aspirin: Secondary | ICD-10-CM | POA: Diagnosis not present

## 2016-01-21 DIAGNOSIS — I4891 Unspecified atrial fibrillation: Secondary | ICD-10-CM | POA: Diagnosis present

## 2016-01-21 DIAGNOSIS — M810 Age-related osteoporosis without current pathological fracture: Secondary | ICD-10-CM | POA: Diagnosis present

## 2016-01-21 DIAGNOSIS — Z833 Family history of diabetes mellitus: Secondary | ICD-10-CM | POA: Diagnosis not present

## 2016-01-21 DIAGNOSIS — M419 Scoliosis, unspecified: Secondary | ICD-10-CM | POA: Diagnosis present

## 2016-01-21 DIAGNOSIS — R51 Headache: Secondary | ICD-10-CM | POA: Diagnosis not present

## 2016-01-21 DIAGNOSIS — I480 Paroxysmal atrial fibrillation: Secondary | ICD-10-CM

## 2016-01-21 DIAGNOSIS — R509 Fever, unspecified: Secondary | ICD-10-CM | POA: Diagnosis not present

## 2016-01-21 DIAGNOSIS — Z87442 Personal history of urinary calculi: Secondary | ICD-10-CM | POA: Diagnosis not present

## 2016-01-21 DIAGNOSIS — Z888 Allergy status to other drugs, medicaments and biological substances status: Secondary | ICD-10-CM | POA: Diagnosis not present

## 2016-01-21 DIAGNOSIS — N189 Chronic kidney disease, unspecified: Secondary | ICD-10-CM | POA: Diagnosis present

## 2016-01-21 DIAGNOSIS — R05 Cough: Secondary | ICD-10-CM | POA: Diagnosis not present

## 2016-01-21 DIAGNOSIS — D120 Benign neoplasm of cecum: Secondary | ICD-10-CM | POA: Diagnosis not present

## 2016-01-21 DIAGNOSIS — K635 Polyp of colon: Secondary | ICD-10-CM | POA: Diagnosis not present

## 2016-01-21 DIAGNOSIS — Z79899 Other long term (current) drug therapy: Secondary | ICD-10-CM | POA: Diagnosis not present

## 2016-01-21 DIAGNOSIS — I10 Essential (primary) hypertension: Secondary | ICD-10-CM | POA: Diagnosis not present

## 2016-01-21 DIAGNOSIS — R001 Bradycardia, unspecified: Secondary | ICD-10-CM | POA: Diagnosis not present

## 2016-01-21 DIAGNOSIS — I071 Rheumatic tricuspid insufficiency: Secondary | ICD-10-CM | POA: Diagnosis present

## 2016-01-21 DIAGNOSIS — Z86010 Personal history of colonic polyps: Secondary | ICD-10-CM | POA: Diagnosis not present

## 2016-01-21 DIAGNOSIS — M1990 Unspecified osteoarthritis, unspecified site: Secondary | ICD-10-CM | POA: Diagnosis present

## 2016-01-21 DIAGNOSIS — E213 Hyperparathyroidism, unspecified: Secondary | ICD-10-CM | POA: Diagnosis present

## 2016-01-21 DIAGNOSIS — Z803 Family history of malignant neoplasm of breast: Secondary | ICD-10-CM | POA: Diagnosis not present

## 2016-01-21 DIAGNOSIS — R112 Nausea with vomiting, unspecified: Secondary | ICD-10-CM | POA: Diagnosis not present

## 2016-01-21 DIAGNOSIS — Z01812 Encounter for preprocedural laboratory examination: Secondary | ICD-10-CM | POA: Diagnosis not present

## 2016-01-21 DIAGNOSIS — Z8249 Family history of ischemic heart disease and other diseases of the circulatory system: Secondary | ICD-10-CM | POA: Diagnosis not present

## 2016-01-21 DIAGNOSIS — E785 Hyperlipidemia, unspecified: Secondary | ICD-10-CM | POA: Diagnosis present

## 2016-01-21 DIAGNOSIS — K5900 Constipation, unspecified: Secondary | ICD-10-CM | POA: Diagnosis present

## 2016-01-21 DIAGNOSIS — I129 Hypertensive chronic kidney disease with stage 1 through stage 4 chronic kidney disease, or unspecified chronic kidney disease: Secondary | ICD-10-CM | POA: Diagnosis present

## 2016-01-21 LAB — CBC WITH DIFFERENTIAL/PLATELET
Basophils Absolute: 0 10*3/uL (ref 0.0–0.1)
Basophils Relative: 0 %
Eosinophils Absolute: 0.1 10*3/uL (ref 0.0–0.7)
Eosinophils Relative: 0 %
HCT: 40.6 % (ref 36.0–46.0)
Hemoglobin: 13.5 g/dL (ref 12.0–15.0)
Lymphocytes Relative: 10 %
Lymphs Abs: 1.1 10*3/uL (ref 0.7–4.0)
MCH: 31.8 pg (ref 26.0–34.0)
MCHC: 33.3 g/dL (ref 30.0–36.0)
MCV: 95.8 fL (ref 78.0–100.0)
Monocytes Absolute: 0.8 10*3/uL (ref 0.1–1.0)
Monocytes Relative: 8 %
Neutro Abs: 9.2 10*3/uL — ABNORMAL HIGH (ref 1.7–7.7)
Neutrophils Relative %: 82 %
Platelets: 187 10*3/uL (ref 150–400)
RBC: 4.24 MIL/uL (ref 3.87–5.11)
RDW: 13.5 % (ref 11.5–15.5)
WBC: 11.2 10*3/uL — ABNORMAL HIGH (ref 4.0–10.5)

## 2016-01-21 LAB — CK TOTAL AND CKMB (NOT AT ARMC)
CK, MB: 8.6 ng/mL — ABNORMAL HIGH (ref 0.5–5.0)
Relative Index: 2.2 (ref 0.0–2.5)
Total CK: 388 U/L — ABNORMAL HIGH (ref 38–234)

## 2016-01-21 LAB — BASIC METABOLIC PANEL
Anion gap: 9 (ref 5–15)
BUN: 8 mg/dL (ref 6–20)
CO2: 23 mmol/L (ref 22–32)
Calcium: 8.2 mg/dL — ABNORMAL LOW (ref 8.9–10.3)
Chloride: 109 mmol/L (ref 101–111)
Creatinine, Ser: 0.56 mg/dL (ref 0.44–1.00)
GFR calc Af Amer: >60 mL/min (ref >60)
GFR calc non Af Amer: >60 mL/min (ref >60)
Glucose, Bld: 126 mg/dL — ABNORMAL HIGH (ref 65–99)
Potassium: 3.6 mmol/L (ref 3.5–5.1)
Sodium: 141 mmol/L (ref 135–145)

## 2016-01-21 MED ORDER — DILTIAZEM LOAD VIA INFUSION
10.0000 mg | Freq: Once | INTRAVENOUS | Status: AC
  Administered 2016-01-21: 10 mg via INTRAVENOUS
  Filled 2016-01-21: qty 10

## 2016-01-21 MED ORDER — DILTIAZEM HCL 100 MG IV SOLR
5.0000 mg/h | INTRAVENOUS | Status: DC
  Administered 2016-01-21: 10 mg/h via INTRAVENOUS
  Administered 2016-01-21: 12.5 mg/h via INTRAVENOUS
  Administered 2016-01-21: 15 mg/h via INTRAVENOUS
  Administered 2016-01-21: 10 mg/h via INTRAVENOUS
  Administered 2016-01-21: 5 mg/h via INTRAVENOUS
  Filled 2016-01-21 (×2): qty 100

## 2016-01-21 NOTE — Consult Note (Signed)
Admit date: 01/19/2016 Referring Physician  Dr. Alphonsa Overall Primary Physician Kelton Pillar, MD Primary Cardiologist  Dr. Aundra Dubin Reason for Consultation  atrial fibrillation with rapid ventricular response postoperatively  HPI: 75 year old female with history of labile hypertension, occasional orthostatic type symptoms, hyperlipidemia who underwent colonoscopy for constipation which discovered a single polyp at the appendiceal orifice. Biopsy showed adenoma with high-grade dysplasia. There was concern for possible invasive disease. Laparoscopic appendectomy was performed on 01/19/16.  Postoperatively, she felt nausea, poor intake, mild confusion then developed atrial fibrillation with rapid ventricular response at approximate 10 AM on 01/21/16. We will consult. IV diltiazem was started.  She felt her heart pounding. No CP, no SOB. No prior CVA.      PMH:   Past Medical History  Diagnosis Date  . HYPERPARATHYROIDISM UNSPECIFIED 10/20/2007  . HYPERLIPIDEMIA 07/07/2007  . ANEMIA-NOS 07/07/2007  . HYPERTENSION 02/04/2008  . DIVERTICULOSIS, COLON 02/23/2009  . UTI'S, RECURRENT 11/20/2009    kimbrough   . OSTEOPOROSIS 01/01/2008  . SCOLIOSIS 05/25/2009  . FATIGUE 05/25/2009  . PALPITATIONS, OCCASIONAL 07/12/2008  . URINARY INCONTINENCE 07/07/2007  . ADVERSE REACTION TO MEDICATION 07/31/2009  . COLONIC POLYPS, ADENOMATOUS, HX OF   . NEPHROLITHIASIS, HX OF 11/15/2008  . PARATHYROIDECTOMY 01/02/2009  . Seasonal allergies   . Unspecified constipation   . Other acquired absence of organ     parathyroidectomy  . Fever, unspecified   . Abdominal pain, unspecified site   . Disturbance of skin sensation   . Pain in limb   . Chest pain, unspecified   . Family history of diabetes mellitus   . Family history of malignant neoplasm of breast   . Heart murmur     hx of  . Chronic kidney disease     hx of kidney stone  . Arthritis     PSH:   Past Surgical History  Procedure Laterality Date  .  Right oophorectomy    . Parathyroidectomy      Rt Superior open neck exploration  . Colonoscopy    . Polypectomy    . Bunionectomy    . Tonsillectomy    . Laparoscopic appendectomy  01/19/2016    Procedure: APPENDECTOMY LAPAROSCOPIC with orifice of cecal polyp;  Surgeon: Leighton Ruff, MD;  Location: [facility];  Service: General;;   Allergies:  Lisinopril Prior to Admit Meds:   Prior to Admission medications   Medication Sig Start Date End Date Taking? Authorizing Provider  aspirin 81 MG tablet Take 81 mg by mouth daily.     Yes Historical Provider, MD  atorvastatin (LIPITOR) 20 MG tablet TAKE 1 TABLET EACH DAY. Patient taking differently: Take 20 mg by mouth daily. TAKE 1 TABLET EACH DAY. 09/18/15  Yes Hoyt Koch, MD  cholecalciferol (VITAMIN D) 1000 UNITS tablet Take 1,000 Units by mouth daily.   Yes Historical Provider, MD  diphenhydramine-acetaminophen (TYLENOL PM) 25-500 MG TABS Take 0.5 tablets by mouth at bedtime as needed (For sleep.).    Yes Historical Provider, MD  ibuprofen (ADVIL,MOTRIN) 200 MG tablet Take 200 mg by mouth every 8 (eight) hours as needed for headache or mild pain.   Yes Historical Provider, MD   Carolin [place]:   Father died 62 MI, mother died late 15's rheumatic heart dz.  Family History  Problem Relation Age of Onset  . Breast cancer Other     1st degree relative <50  . Diabetes      1st degree relative  . Sudden death Father 9  .  Heart disease Father 11    MI  . Heart disease Mother     valve replacement  . Colon cancer Neg Hx   . Stomach cancer Neg Hx    Social HX:    Social History   Social History  . Marital Status: Married    Spouse Name: N/A  . Number of Children: 5  . Years of Education: N/A   Occupational History  . travel agent    Social History Main Topics  . Smoking status: Never Smoker   . Smokeless tobacco: Never Used  . Alcohol Use: 3.0 oz/week    5 Glasses of wine per week     Comment: occ. wine  . Drug Use: No  .  Sexual Activity: No   Other Topics Concern  . Not on file   Social History Narrative   HH of 5    Kids at home now   Married   travel agent.    2-3 c coffee in am    ocass wine    G3P2vaginal delivery   Pt cell [phone]              ROS:  All 11 ROS were addressed and are negative except what is stated in the HPI   Physical Exam: Blood pressure 151/101, pulse 153, temperature 99.1 F (37.3 C), temperature source Axillary, resp. rate 18, height 5' 0.5" (1.537 m), weight 118 lb 6 oz (53.695 kg), SpO2 95 %.   General: Thin, in no acute distress Head: Eyes PERRLA, No xanthomas.   Normal cephalic and atramatic  Lungs:   Clear bilaterally to auscultation and percussion. Normal respiratory effort. No wheezes, no rales. Heart:   Irregular irregular, tachycardic S1 S2 Pulses are 2+ & equal. Soft systolic murmur, no rubs, gallops.  No carotid bruit. No JVD.  No abdominal bruits.  Abdomen: Bowel sounds are positive, abdomen soft and non-tender without masses. No hepatosplenomegaly. Postoperative findings Msk:  Back normal. Normal strength and tone for age. Extremities:  No clubbing, cyanosis or edema.  DP +1 Neuro: Alert and oriented X 3, non-focal, MAE x 4 GU: Deferred Rectal: Deferred Psych:  Good affect, responds appropriately      Labs: Lab Results  Component Value Date   WBC 5.2 01/11/2016   HGB 14.1 01/11/2016   HCT 43.6 01/11/2016   MCV 96.7 01/11/2016   PLT 237 01/11/2016    Lab Results  Component Value Date   CHOL 132 01/25/2015   HDL 68 01/25/2015   LDLCALC 51 01/25/2015   TRIG 64 01/25/2015     EKG:  Atrial fibrillation with rapid ventricular response, heart rate in the 150 range with nonspecific ST-T wave changes Personally viewed.   ECHO 03/28/11: - Left ventricle: The cavity size was normal. There was mild focal basal hypertrophy of the septum. Systolic function was normal. Theestimated ejection fraction was in the range of 60% to 65%. Wallmotion  was normal; there were no regional wall motion abnormalities. Features are consistent with a pseudonormal left ventricular filling pattern, with concomitant abnormal relaxation and increased filling pressure (grade 2 diastolic dysfunction). Doppler parameters are consistent with high ventricular filling pressure. - Mitral valve: Mild regurgitation.  ASSESSMENT/PLAN:    75 year old female with rapid ventricular response atrial fibrillation postoperatively appendectomy secondary to high grade dysplasia of adenoma.  Atrial fibrillation, paroxysmal postoperative -IV diltiazem has been initiated, increase up to 15 mg per hour CHADSVASc 4 (Age2, HTN, Female) -For now hold off on anticoagulation given her  recent operation and risk for bleeding. -Hopefully within the next 12-24 hours she will convert back to normal rhythm. -If not, will need to consider long term anticoagulation. Understands risk of CVA.  -I will order echocardiogram  Essential hypertension -Currently controlled  Status post appendectomy, high-grade dysplasia of adenoma  Candee Furbish, MD  01/21/2016  11:39 AM

## 2016-01-21 NOTE — Progress Notes (Signed)
Patient's HR is down to 49 bpm, Sinus bradicardia. BP taken 97/45. Cardizem stopped per parameter. Will continue to monitor patient.

## 2016-01-21 NOTE — Progress Notes (Addendum)
[facility] Office:  [phone] General Surgery Progress Note    POD -  2 Days Post-Op  Assessment/Plan: 1.  APPENDECTOMY LAPAROSCOPIC with orifice of cecal polyp - 01/19/2016 - Marcello Moores  Had a lot of nausea yesterday, somewhat better today.  Her po intake is not good.  She's not ready to go home yet.  2.  DVT prophylaxis - Lovenox  3.  A Fib After making rounds, the patient felt her chest pounding and did not feel good. EKG showed a fib with RVR She has been moved to telemetry, started on Cardizem drip, her rate is controlled, and she was seen by Dr. Luther Parody.  I appreciate his assistance. She is doing better (around 3 PM)   Principal Problem:   Colon polyp s/p appy/partialcecetomy 01/19/3016 Active Problems:   Essential hypertension  Subjective:  Had a lot of nausea yesterday, somewhat better today.  Her po intake is not good.  She's not ready to go home yet.  Has a headache today.  She remains mildly confused.  Objective:   Filed Vitals:   01/21/16 0117 01/21/16 0533  BP: 131/67 151/72  Pulse: 70 70  Temp: 98 F (36.7 C) 99.1 F (37.3 C)  Resp: 16 16     Intake/Output from previous day:  03/04 0701 - 03/05 0700 In: 2470 [P.O.:120; I.V.:1350; IV Piggyback:1000] Out: 1250 [Urine:1250]  Intake/Output this shift:      Physical Exam:   General: Older WF who is alert and oriented.    HEENT: Normal. Pupils equal. .   Lungs: Poor IS.  She is only pulling about 500 cc.   Abdomen: Mild distention, but BS present   Wound: Clean.   Lab Results:   No results for input(s): WBC, HGB, HCT, PLT in the last 72 hours.  BMET  No results for input(s): NA, K, CL, CO2, GLUCOSE, BUN, CREATININE, CALCIUM in the last 72 hours.  PT/INR  No results for input(s): LABPROT, INR in the last 72 hours.  ABG  No results for input(s): PHART, HCO3 in the last 72 hours.  Invalid input(s): PCO2, PO2   Studies/Results:  No results found.   Anti-infectives:   Anti-infectives     Start     Dose/Rate Route Frequency Ordered Stop   01/19/16 1000  cefoTEtan in Dextrose 5% (CEFOTAN) IVPB 2 g     2 g Intravenous On call to O.R. 01/19/16 0908 01/19/16 1200      Alphonsa Overall, MD, FACS Pager: [facility] Office: [phone] 01/21/2016

## 2016-01-21 NOTE — Progress Notes (Signed)
I spoke to Beth-RN. Patient's HR 152.  Starting IV cardizem at 5mg /hr after 10mg  bolus.  Consult pending.  [name] PAC

## 2016-01-21 NOTE — Progress Notes (Signed)
01/21/16 10:45: Pt c/o "heart beating out of my chest". BP 160/97, HR 60-160. 02 sat 91%. EKG shows Atrial fib. Dr Lucia Gaskins notified.

## 2016-01-21 NOTE — Progress Notes (Signed)
01/21/16 1150: Transferred to 4th tele. Via bed with O2 at 2LPM.  Donne Hazel, RN

## 2016-01-21 NOTE — Progress Notes (Addendum)
Patient is on Cardizem gtt, rate titrated to 10 ml/hr. Goal achieved, HR 62-68, SR- VS stable 64, 124/60. Per CMT, pt converted 14:30. Patient denied complaints of any kind.  Cardiac provider contacted, V.O. Given to maintain Cardizem gtt at current rate unless HR less than 50 and BP wnl SBP not less than 105. Will continue to monitor

## 2016-01-21 NOTE — Progress Notes (Signed)
PT Cancellation Note  Patient Details Name: LINDASY ZAIS MRN: [id number] DOB: [birth year]   Cancelled Treatment:     PT deferred this date.  Pt in A-fib and being transferred to telemetry.  Will follow.   Sharrieff Spratlin 01/21/2016, 12:33 PM

## 2016-01-21 NOTE — Progress Notes (Signed)
Cardiac provider at bedside, V.O received to increase cardizem gtt to 10 mg. Vs stable 140/86, 108, 18, 95% 2 lt  HR 134

## 2016-01-21 NOTE — Progress Notes (Signed)
OT Cancellation Note  Patient Details Name: [name] MRN: [id number] DOB: 07-23-1941   Cancelled Treatment:    Reason Eval/Treat Not Completed: Medical issues which prohibited therapy (Pt in A-fib and being transferred to telemetry)  Vonita Moss   OTR/L Pager: [phone] Office: [phone] .  01/21/2016, 3:06 PM

## 2016-01-22 ENCOUNTER — Encounter (HOSPITAL_COMMUNITY): Payer: Self-pay | Admitting: Physician Assistant

## 2016-01-22 ENCOUNTER — Inpatient Hospital Stay (HOSPITAL_COMMUNITY): Payer: Medicare Other

## 2016-01-22 ENCOUNTER — Other Ambulatory Visit: Payer: Self-pay | Admitting: Physician Assistant

## 2016-01-22 DIAGNOSIS — I4891 Unspecified atrial fibrillation: Secondary | ICD-10-CM

## 2016-01-22 MED ORDER — IBUPROFEN 200 MG PO TABS: 600.0000 mg | ORAL_TABLET | Freq: Four times a day (QID) | ORAL | Status: DC | PRN

## 2016-01-22 MED ORDER — TRAMADOL HCL 50 MG PO TABS: 50.0000 mg | ORAL_TABLET | Freq: Four times a day (QID) | ORAL | Status: DC | PRN

## 2016-01-22 NOTE — Progress Notes (Signed)
Patient Name: [name] Date of Encounter: 01/22/2016  Principal Problem:   Colon polyp s/p appy/partialcecetomy 01/19/3016 Active Problems:   Essential hypertension   Atrial fibrillation with rapid ventricular response [facility])   Primary Cardiologist: Dr Aundra Dubin  Patient Profile: 75 yo female w/ labile HTN, HLD, occ palps, parathyroidectomy, anemia. Pt had lap choley performed 03/03, went into rapid afib approx 10 am 03/05.  CHADSVASc 4 (Age2, HTN, Female). Echo ordered.  SUBJECTIVE: Feels much better, surgeon said she could go today. Palpitations went away when she converted. Has had palps at times PTA, but never this strong and lasted only seconds. Has low-grade fever and cough.  OBJECTIVE Filed Vitals:   01/21/16 2121 01/21/16 2305 01/21/16 2307 01/22/16 0550  BP:   97/45 126/94  Pulse: 65 49 49 60  Temp: 98.2 F (36.8 C)   99.4 F (37.4 C)  TempSrc: Oral   Oral  Resp: 20   18  Height:      Weight:      SpO2: 97%   97%    Intake/Output Summary (Last 24 hours) at 01/22/16 0830 Last data filed at 01/22/16 0600  Gross per 24 hour  Intake 911.67 ml  Output      0 ml  Net 911.67 ml   Filed Weights   01/19/16 0923  Weight: 118 lb 6 oz (53.695 kg)    PHYSICAL EXAM General: Well developed, well nourished, female in no acute distress. Head: Normocephalic, atraumatic.  Neck: Supple without bruits, JVD not elevaed. Lungs:  Resp regular and unlabored, rales bases. Heart: RRR, S1, S2, no S3, S4, faint murmur; no rub. Abdomen: Soft, tender, non-distended, BS + x 4.  Extremities: No clubbing, cyanosis, edema.  Neuro: Alert and oriented X 3. Moves all extremities spontaneously. Psych: Normal affect.  LABS: CBC: Recent Labs  01/21/16 1115  WBC 11.2*  NEUTROABS 9.2*  HGB 13.5  HCT 40.6  MCV 95.8  PLT 123XX123   Basic Metabolic Panel: Recent Labs  01/21/16 1115  NA 141  K 3.6  CL 109  CO2 23  GLUCOSE 126*  BUN 8  CREATININE 0.56  CALCIUM 8.2*   Cardiac  Enzymes: Recent Labs  01/21/16 1115  CKTOTAL 388*  CKMB 8.6*  INDEX 2.2   TELE:  Atrial fib RVR>>SR 14:24 on 01/21/2016; sinus brady high 40s at times      Current Medications:  . aspirin EC  81 mg Oral Daily  . atorvastatin  20 mg Oral Daily  . bisacodyl  10 mg Rectal Daily  . enoxaparin (LOVENOX) injection  40 mg Subcutaneous Q24H   . diltiazem (CARDIZEM) infusion Stopped (01/21/16 2315)    ASSESSMENT AND PLAN: Principal Problem:   Colon polyp s/p appy/partialcecetomy 01/19/3016 - ready for d/c  Active Problems:   Essential hypertension - good control now    Atrial fibrillation with rapid ventricular response (HCC) - spont conv SR in just a few hours - CHADS2VASC=4 - No hx of this, but has occ brief palpitations - bradycardic on Cardizem, d/c'd. - MD advise if we need to start systemic anticoagulation - may need rx for Cardizme 30 mg to take prn, but not sure baseline HR will tolerate daily dose. - MD advise on event monitor to see if she is having silent afib.    Cough w/ low-grade fever - likely ATX, but will ck CXR to be sure.  Plan: possible d/c later today, once f/u plans in place. Echo ordered, can do as  OP if not completed.  Signed, Rosaria Ferries , PA-C 8:30 AM 01/22/2016  Patient examined chart reviewed.  Incisions from surgery appendix healing well NSR on exam.  Reviewed echo and only mild LAE normal EF mild MR Ok to d/c home no anticoagulation for now Outpatient f/u Dr Aundra Dubin and 21 day  Event monitor to see if PAF recurs outside context of acute surgery   Jenkins Rouge

## 2016-01-22 NOTE — Progress Notes (Signed)
  Echocardiogram 2D Echocardiogram has been performed.  Tresa Res 01/22/2016, 10:33 AM

## 2016-01-22 NOTE — Progress Notes (Addendum)
[facility]  [address] [place], [place] [address] Phone: [phone] FAX: [place] [phone] Mar 12, 1941   Assessment  Problem List:   Principal Problem:   Colon polyp s/p appy/partialcecetomy 01/19/3016 Active Problems:   Essential hypertension   Atrial fibrillation with rapid ventricular response ([place])   3 Days Post-Op  01/19/2016  Procedure(s): APPENDECTOMY LAPAROSCOPIC with orifice of cecal polyp    Some nausea and confusion most likely related to anesthesia - RESOLVED.  Popped into A. FIB RVR - moved to telemetry.  Converted to NSR, then bradycardia.  Cardizem weaned off  Plan:  F/u cardiology - ECHO results. ?Need to PO rate control? Adv soilds Check orthostatic vital signs.  Follow up pathology. VTE prophylaxis- SCDs, etc -mobilize as tolerated to help recovery.  Ambulate.  D/C patient from hospital when patient meets criteria (anticipate in 0-1 day(s)):  Tolerating oral intake well Ambulating well Adequate pain control without IV medications Urinating  Having flatus Cardiolgy orders postop plan ?meds ?anticoag (prob not if <24hr & NSR now)    Adin Hector, M.D., F.A.C.S. Gastrointestinal and Minimally Invasive Surgery [facility] [address] [place], [place] [address] [phone] Main / Paging   01/22/2016  Subjective:  Afib yesterday - IV diltiazem ggt - converted - weaned off due to bradycardia Feels good this AM No headache Not confused now Tol PO - hungry  Objective:  Vital signs:  Filed Vitals:   01/21/16 2121 01/21/16 2305 01/21/16 2307 01/22/16 0550  BP:   97/45 126/94  Pulse: 65 49 49 60  Temp: 98.2 F (36.8 C)   99.4 F (37.4 C)  TempSrc: Oral   Oral  Resp: 20   18  Height:      Weight:      SpO2: 97%   97%    Last BM Date: 01/20/16  Intake/Output   Yesterday:  03/05 0701 - 03/06 0700 In: 911.7  [I.V.:911.7] Out: -  This shift:     Bowel function:  Flatus: y  BM: y  Drain: n/a  Physical Exam:  General: Pt awake/alert/oriented x4 in no acute distress.  Smiling.  relaxed Eyes: PERRL, normal EOM.  Sclera clear.  No icterus Neuro: CN II-XII intact w/o focal sensory/motor deficits. Lymph: No head/neck/groin lymphadenopathy Psych:  No delerium/psychosis/paranoia.  Mildly confused at first but pleasantly so.   HENT: Normocephalic, Mucus membranes moist.  No thrush.  Likes cool washcloth on her forehead  Neck: Supple, No tracheal deviation Chest: No chest wall pain w good excursion CV:  Pulses intact.  Regular rhythm MS: Normal AROM mjr joints.  No obvious deformity Abdomen: Soft.  Nondistended.  Minimally tender at LLQ incision only.  Incisions c/d/i.  No evidence of peritonitis.  No incarcerated hernias. Ext:  SCDs BLE.  No mjr edema.  No cyanosis Skin: No petechiae / purpura  Results:   Labs: Results for orders placed or performed during the hospital encounter of 01/19/16 (from the past 48 hour(s))  CBC with Differential/Platelet     Status: Abnormal   Collection Time: 01/21/16 11:15 AM  Result Value Ref Range   WBC 11.2 (H) 4.0 - 10.5 K/uL   RBC 4.24 3.87 - 5.11 MIL/uL   Hemoglobin 13.5 12.0 - 15.0 g/dL   HCT 40.6 36.0 - 46.0 %   MCV 95.8 78.0 - 100.0 fL   MCH 31.8 26.0 - 34.0 pg   MCHC 33.3 30.0 - 36.0  g/dL   RDW 13.5 11.5 - 15.5 %   Platelets 187 150 - 400 K/uL   Neutrophils Relative % 82 %   Neutro Abs 9.2 (H) 1.7 - 7.7 K/uL   Lymphocytes Relative 10 %   Lymphs Abs 1.1 0.7 - 4.0 K/uL   Monocytes Relative 8 %   Monocytes Absolute 0.8 0.1 - 1.0 K/uL   Eosinophils Relative 0 %   Eosinophils Absolute 0.1 0.0 - 0.7 K/uL   Basophils Relative 0 %   Basophils Absolute 0.0 0.0 - 0.1 K/uL  Basic metabolic panel     Status: Abnormal   Collection Time: 01/21/16 11:15 AM  Result Value Ref Range   Sodium 141 135 - 145 mmol/L   Potassium 3.6 3.5 - 5.1 mmol/L    Chloride 109 101 - 111 mmol/L   CO2 23 22 - 32 mmol/L   Glucose, Bld 126 (H) 65 - 99 mg/dL   BUN 8 6 - 20 mg/dL   Creatinine, Ser 0.56 0.44 - 1.00 mg/dL   Calcium 8.2 (L) 8.9 - 10.3 mg/dL   GFR calc non Af Amer >60 >60 mL/min   GFR calc Af Amer >60 >60 mL/min    Comment: (NOTE) The eGFR has been calculated using the CKD EPI equation. This calculation has not been validated in all clinical situations. eGFR's persistently <60 mL/min signify possible Chronic Kidney Disease.    Anion gap 9 5 - 15  CK total and CKMB (cardiac)not at [facility]     Status: Abnormal   Collection Time: 01/21/16 11:15 AM  Result Value Ref Range   Total CK 388 (H) 38 - 234 U/L   CK, MB 8.6 (H) 0.5 - 5.0 ng/mL   Relative Index 2.2 0.0 - 2.5    Comment: Performed at [facility] / Studies: No results found.  Medications / Allergies: per chart  Antibiotics: Anti-infectives    Start     Dose/Rate Route Frequency Ordered Stop   01/19/16 1000  cefoTEtan in Dextrose 5% (CEFOTAN) IVPB 2 g     2 g Intravenous On call to O.R. 01/19/16 0908 01/19/16 1200        Note: Portions of this report may have been transcribed using voice recognition software. Every effort was made to ensure accuracy; however, inadvertent computerized transcription errors may be present.   Any transcriptional errors that result from this process are unintentional.     Adin Hector, M.D., F.A.C.S. Gastrointestinal and Minimally Invasive Surgery [facility] [address] [place], [place] [address] [phone] Main / Paging   01/22/2016  CARE TEAM:  PCP: Kelton Pillar, MD  Outpatient Care Team: Patient Care Team: Lanice Shirts, MD as PCP - General (Internal Medicine) Larey Dresser, MD (Cardiology) Amy Martinique, MD (Dermatology) [facility] Grant Fontana., MD (Urology)  Inpatient Treatment Team: Treatment Team: Attending Provider: Leighton Ruff, MD; Technician:  Abbe Amsterdam, NT; Technician: Sueanne Margarita, NT; Technician: Tenna Child, [place]; Consulting Physician: Michae Kava Lbcardiology, MD; Registered Nurse: Hermina Staggers, RN; Occupational Therapist: Betsy Pries, OT; Registered Nurse: Joellen Jersey, RN; Technician: Kirt Boys, NT; Technician: Ennis Forts, NT; Physical Therapist: Fuller Mandril, PT; Registered Nurse: Hollace Hayward, RN; Consulting Physician: Michael Boston, MD; Consulting Physician: Nolon Nations, MD

## 2016-01-23 ENCOUNTER — Ambulatory Visit (INDEPENDENT_AMBULATORY_CARE_PROVIDER_SITE_OTHER): Payer: Medicare Other

## 2016-01-23 DIAGNOSIS — I4891 Unspecified atrial fibrillation: Secondary | ICD-10-CM | POA: Diagnosis not present

## 2016-01-23 NOTE — Discharge Summary (Signed)
Physician Discharge Summary  Patient ID: [name] MRN: [id number] DOB/AGE: 76-Jun-1942 76 y.o.  Admit date: 01/19/2016 Discharge date: 01/22/2016  Patient Care Team: Lanice Shirts, MD as PCP - General (Internal Medicine) Larey Dresser, MD (Cardiology) Amy Martinique, MD (Dermatology) [facility] Grant Fontana., MD (Urology)  Admission Diagnoses: Principal Problem:   Colon polyp s/p appy/partialcecetomy 01/19/3016 Active Problems:   Essential hypertension   Atrial fibrillation with rapid ventricular response [facility])   Discharge Diagnoses:  Principal Problem:   Colon polyp s/p appy/partialcecetomy 01/19/3016 Active Problems:   Essential hypertension   Atrial fibrillation with rapid ventricular response ([place])   POST-OPERATIVE DIAGNOSIS:   cecal polyp  SURGERY:  01/19/2016  Procedure(s): APPENDECTOMY LAPAROSCOPIC with orifice of cecal polyp  SURGEON:    Surgeon(s): Leighton Ruff, MD   Diagnosis Colon, segmental resection for tumor, portion of cecum with appendix - COLONIC ADENOMA WITH FOCI OF HIGH GRADE GLANDULAR DYSPLASIA. - MARGINS ARE NEGATIVE. - SEE COMMENT.  Microscopic Comment Sections demonstrate a colonic adenoma with foci of high grade glandular dysplasia. No invasive tumor is identified. The adenoma demonstrates a tubular morphology on the current resection; although this is the case, the previous biopsy reported some degree of villous features (see previous report [id number]). The adenoma is present at the appendiceal os. Sections of the mid and distal appendix demonstrate benign appendiceal epithelium. The margins of the specimen are negative for dysplasia or tumor. (RH:kh 01-22-16) Willeen Niece MD Pathologist, Electronic Signature (Case signed 01/22/2016)   Consults: cardiology  Hospital Course:   The patient underwent the surgery above.  Postoperatively, the patient struggled with mild confusion and headaches with nausea.  She gradually  mobilized and advanced to a solid diet.  Pain and other symptoms were treated aggressively.  She popped into AFib with RVR.  Cardiology consulted & converted to sinus bradycardia after <24hr Cardizem.    ECHO noted good EF & function.    By the time of discharge, the patient was walking well the hallways, eating food, having flatus.  Pain was well-controlled on an oral medications.  Based on meeting discharge criteria and continuing to recover, I & cardiology felt it was safe for the patient to be discharged from the hospital to further recover with close followup.  Pathology c/w adenoma with high grade dysplasia but no cancer & margins negative.   Postoperative recommendations were discussed in detail.  They are written as well.   Significant Diagnostic Studies:  Results for orders placed or performed during the hospital encounter of 01/19/16 (from the past 72 hour(s))  CBC with Differential/Platelet     Status: Abnormal   Collection Time: 01/21/16 11:15 AM  Result Value Ref Range   WBC 11.2 (H) 4.0 - 10.5 K/uL   RBC 4.24 3.87 - 5.11 MIL/uL   Hemoglobin 13.5 12.0 - 15.0 g/dL   HCT 40.6 36.0 - 46.0 %   MCV 95.8 78.0 - 100.0 fL   MCH 31.8 26.0 - 34.0 pg   MCHC 33.3 30.0 - 36.0 g/dL   RDW 13.5 11.5 - 15.5 %   Platelets 187 150 - 400 K/uL   Neutrophils Relative % 82 %   Neutro Abs 9.2 (H) 1.7 - 7.7 K/uL   Lymphocytes Relative 10 %   Lymphs Abs 1.1 0.7 - 4.0 K/uL   Monocytes Relative 8 %   Monocytes Absolute 0.8 0.1 - 1.0 K/uL   Eosinophils Relative 0 %   Eosinophils Absolute 0.1 0.0 - 0.7 K/uL   Basophils Relative  0 %   Basophils Absolute 0.0 0.0 - 0.1 K/uL  Basic metabolic panel     Status: Abnormal   Collection Time: 01/21/16 11:15 AM  Result Value Ref Range   Sodium 141 135 - 145 mmol/L   Potassium 3.6 3.5 - 5.1 mmol/L   Chloride 109 101 - 111 mmol/L   CO2 23 22 - 32 mmol/L   Glucose, Bld 126 (H) 65 - 99 mg/dL   BUN 8 6 - 20 mg/dL   Creatinine, Ser 0.56 0.44 - 1.00 mg/dL    Calcium 8.2 (L) 8.9 - 10.3 mg/dL   GFR calc non Af Amer >60 >60 mL/min   GFR calc Af Amer >60 >60 mL/min    Comment: (NOTE) The eGFR has been calculated using the CKD EPI equation. This calculation has not been validated in all clinical situations. eGFR's persistently <60 mL/min signify possible Chronic Kidney Disease.    Anion gap 9 5 - 15  CK total and CKMB (cardiac)not at [facility]     Status: Abnormal   Collection Time: 01/21/16 11:15 AM  Result Value Ref Range   Total CK 388 (H) 38 - 234 U/L   CK, MB 8.6 (H) 0.5 - 5.0 ng/mL   Relative Index 2.2 0.0 - 2.5    Comment: Performed at [facility]    Dg Chest 2 View  01/22/2016  CLINICAL DATA:  Patient had a laparoscopic cholecystectomy performed on 01/19/2016. Went into atrial fibrillation on 01/21/2016. Cough and fever today. EXAM: CHEST  2 VIEW COMPARISON:  09/23/2007 FINDINGS: Cardiac silhouette is mildly enlarged. Aorta is prominent and uncoiled. No mediastinal or hilar masses or convincing adenopathy. Surgical vascular clips at the right neck base are consistent with a partial thyroidectomy. Lungs are hyperexpanded. There are new small pleural effusions. There is no convincing pneumonia or pulmonary edema. No pneumothorax. Mild compression deformity of a mid thoracic vertebra is stable from the prior study. Bony thorax is demineralized. IMPRESSION: 1. No evidence of pneumonia or pulmonary edema. 2. Small pleural effusions new since the prior study. Electronically Signed   By: Lajean Manes M.D.   On: 01/22/2016 11:18    Discharge Exam: Blood pressure 126/94, pulse 60, temperature 98.1 F (36.7 C), temperature source Oral, resp. rate 18, height 5' 0.5" (1.537 m), weight 53.695 kg (118 lb 6 oz), SpO2 98 %.  General: Pt awake/alert/oriented x4 in no acute distress. Smiling. relaxed Eyes: PERRL, normal EOM. Sclera clear. No icterus Neuro: CN II-XII intact w/o focal sensory/motor deficits. Lymph: No head/neck/groin  lymphadenopathy Psych: No delerium/psychosis/paranoia. Mildly confused at first but pleasantly so.  HENT: Normocephalic, Mucus membranes moist. No thrush. Likes cool washcloth on her forehead  Neck: Supple, No tracheal deviation Chest: No chest wall pain w good excursion CV: Pulses intact. Regular rhythm MS: Normal AROM mjr joints. No obvious deformity Abdomen: Soft. Nondistended. Minimally tender at LLQ incision only. Incisions c/d/i. No evidence of peritonitis. No incarcerated hernias. Ext: SCDs BLE. No mjr edema. No cyanosis Skin: No petechiae / purpura   *[place]*         *[employer] [employer].            [place], [address] [address]              [phone]  ------------------------------------------------------------------- Transthoracic Echocardiography  Patient:  Jing, Howatt MR #:    [id number] Study Date: 01/22/2016 Gender:  F Age:    74 Height:   153.7 cm Weight:   53.7 kg BSA:    1.52 m^2 Pt. Status: Room:    Earl, M.D. SONOGRAPHER Tresa Res, RDCS ADMITTING  Leighton Ruff [id number] [place], [place] [id number] PERFORMING  [facility], Inpatient  cc:  ------------------------------------------------------------------- LV EF: 55% -  60%  ------------------------------------------------------------------- Indications:   Atrial fibrillation - 427.31.  ------------------------------------------------------------------- History:  PMH: No prior cardiac history.  ------------------------------------------------------------------- Study Conclusions  - Left ventricle: The cavity size was normal. Systolic function was normal. The estimated ejection fraction was in the range of 55% to 60%. Wall motion was normal; there were no regional wall motion abnormalities. - Mitral valve: There was mild  regurgitation. - Left atrium: The atrium was mildly dilated. - Atrial septum: No defect or patent foramen ovale was identified. - Pulmonary arteries: PA peak pressure: 49 mm Hg (S).  Transthoracic echocardiography. M-mode, complete 2D, spectral Doppler, and color Doppler. Birthdate: Patient birthdate: [birth year]. Age: Patient is 75 yr old. Sex: Gender: female. BMI: 22.7 kg/m^2. Blood pressure:   126/94 Patient status: Inpatient. Study date: Study date: 01/22/2016. Study time: 09:58 AM. Location: Bedside.  -------------------------------------------------------------------  ------------------------------------------------------------------- Left ventricle: The cavity size was normal. Systolic function was normal. The estimated ejection fraction was in the range of 55% to 60%. Wall motion was normal; there were no regional wall motion abnormalities.  ------------------------------------------------------------------- Aortic valve:  Trileaflet; normal thickness leaflets. Mobility was not restricted. Doppler: Transvalvular velocity was within the normal range. There was no stenosis. There was no regurgitation.  ------------------------------------------------------------------- Aorta: The aorta was normal, not dilated, and non-diseased. Aortic root: The aortic root was normal in size.  ------------------------------------------------------------------- Mitral valve:  Structurally normal valve.  Mobility was not restricted. Doppler: Transvalvular velocity was within the normal range. There was no evidence for stenosis. There was mild regurgitation.  Peak gradient (D): 4 mm Hg.  ------------------------------------------------------------------- Left atrium: The atrium was mildly dilated.  ------------------------------------------------------------------- Atrial septum: No defect or patent foramen ovale was  identified.  ------------------------------------------------------------------- Right ventricle: The cavity size was normal. Wall thickness was normal. Systolic function was normal.  ------------------------------------------------------------------- Pulmonic valve:  Doppler: Transvalvular velocity was within the normal range. There was no evidence for stenosis.  ------------------------------------------------------------------- Tricuspid valve:  Structurally normal valve.  Doppler: Transvalvular velocity was within the normal range. There was mild regurgitation.  ------------------------------------------------------------------- Pulmonary artery:  The main pulmonary artery was normal-sized. Systolic pressure was within the normal range.  ------------------------------------------------------------------- Right atrium: The atrium was normal in size.  ------------------------------------------------------------------- Pericardium: The pericardium was normal in appearance. There was no pericardial effusion.  ------------------------------------------------------------------- Systemic veins: Inferior vena cava: The vessel was normal in size.  ------------------------------------------------------------------- Post procedure conclusions Ascending Aorta:  - The aorta was normal, not dilated, and non-diseased.  ------------------------------------------------------------------- Measurements  Left ventricle             Value    Reference LV ID, ED, PLAX chordal    (L)   37.8 mm   43 - 52 LV ID, ES, PLAX chordal    (L)   22.3 mm   23 - 38 LV fx shortening, PLAX chordal     41  %   >=29 LV PW thickness, ED          8.89 mm   --------- IVS/LV PW ratio, ED          1.16     <=1.3 LV e&', lateral  9.95 cm/s  --------- LV E/e&', lateral            9.46      --------- LV e&', medial             6.84 cm/s  --------- LV E/e&', medial            13.76    --------- LV e&', average             8.4  cm/s  --------- LV E/e&', average            11.21    ---------  Ventricular septum           Value    Reference IVS thickness, ED           10.3 mm   ---------  LVOT                  Value    Reference LVOT ID, S               21  mm   --------- LVOT area               3.46 cm^2  ---------  Aorta                 Value    Reference Aortic root ID, ED           32  mm   ---------  Left atrium              Value    Reference LA ID, A-P, ES             41  mm   --------- LA ID/bsa, A-P         (H)   2.7  cm/m^2 <=2.2 LA volume, S              40.5 ml   --------- LA volume/bsa, S            26.6 ml/m^2 --------- LA volume, ES, 1-p A4C         35.8 ml   --------- LA volume/bsa, ES, 1-p A4C       23.5 ml/m^2 --------- LA volume, ES, 1-p A2C         42.2 ml   --------- LA volume/bsa, ES, 1-p A2C       27.7 ml/m^2 ---------  Mitral valve              Value    Reference Mitral E-wave peak velocity      94.1 cm/s  --------- Mitral A-wave peak velocity      52  cm/s  --------- Mitral deceleration time        225  ms   150 - 230 Mitral peak gradient, D        4   mm Hg --------- Mitral E/A ratio, peak         1.8     ---------  Pulmonary arteries           Value    Reference PA pressure, S, DP       (H)   49  mm Hg <=30  Tricuspid valve            Value    Reference Tricuspid regurg peak velocity     290  cm/s   --------- Tricuspid peak RV-RA gradient     34  mm Hg ---------  Systemic veins  Value    Reference Estimated CVP             15  mm Hg ---------  Right ventricle            Value    Reference RV pressure, S, DP       (H)   49  mm Hg <=30 RV s&', lateral, S           14.1 cm/s  ---------  Legend: (L) and (H) mark values outside specified reference range.  ------------------------------------------------------------------- Prepared and Electronically Authenticated by  Jenkins Rouge, M.D. 2017-03-06T11:11:12  Discharged Condition: good   Past Medical History  Diagnosis Date  . HYPERPARATHYROIDISM UNSPECIFIED 10/20/2007  . HYPERLIPIDEMIA 07/07/2007  . ANEMIA-NOS 07/07/2007  . HYPERTENSION 02/04/2008  . DIVERTICULOSIS, COLON 02/23/2009  . UTI'S, RECURRENT 11/20/2009    kimbrough   . OSTEOPOROSIS 01/01/2008  . SCOLIOSIS 05/25/2009  . FATIGUE 05/25/2009  . PALPITATIONS, OCCASIONAL 07/12/2008  . URINARY INCONTINENCE 07/07/2007  . ADVERSE REACTION TO MEDICATION 07/31/2009  . COLONIC POLYPS, ADENOMATOUS, HX OF   . NEPHROLITHIASIS, HX OF 11/15/2008  . PARATHYROIDECTOMY 01/02/2009  . Seasonal allergies   . Unspecified constipation   . Other acquired absence of organ     parathyroidectomy  . Fever, unspecified   . Abdominal pain, unspecified site   . Disturbance of skin sensation   . Pain in limb   . Chest pain, unspecified   . Family history of diabetes mellitus   . Family history of malignant neoplasm of breast   . Heart murmur     hx of  . Chronic kidney disease     hx of kidney stone  . Arthritis     Past Surgical History  Procedure Laterality Date  . Right oophorectomy    . Parathyroidectomy      Rt Superior open neck exploration  . Colonoscopy    . Polypectomy    . Bunionectomy    . Tonsillectomy    . Laparoscopic appendectomy  01/19/2016    Procedure: APPENDECTOMY  LAPAROSCOPIC with orifice of cecal polyp;  Surgeon: Leighton Ruff, MD;  Location: [facility];  Service: General;;    Social History   Social History  . Marital Status: Married    Spouse Name: N/A  . Number of Children: 5  . Years of Education: N/A   Occupational History  . travel agent    Social History Main Topics  . Smoking status: Never Smoker   . Smokeless tobacco: Never Used  . Alcohol Use: 3.0 oz/week    5 Glasses of wine per week     Comment: occ. wine  . Drug Use: No  . Sexual Activity: No   Other Topics Concern  . Not on file   Social History Narrative   HH of 5    Kids at home now   Married   travel agent.    2-3 c coffee in am    ocass wine    G3P2vaginal delivery   Pt cell [phone]             Family History  Problem Relation Age of Onset  . Breast cancer Other     1st degree relative <50  . Diabetes      1st degree relative  . Sudden death Father 17  . Heart disease Father 28    MI  . Heart disease Mother     valve replacement  . Colon cancer Neg Hx   .  Stomach cancer Neg Hx     No current facility-administered medications for this encounter.   Current Outpatient Prescriptions  Medication Sig Dispense Refill  . aspirin 81 MG tablet Take 81 mg by mouth daily.      [name] atorvastatin (LIPITOR) 20 MG tablet TAKE 1 TABLET EACH DAY. (Patient taking differently: Take 20 mg by mouth daily. TAKE 1 TABLET EACH DAY.) 90 tablet 3  . cholecalciferol (VITAMIN D) 1000 UNITS tablet Take 1,000 Units by mouth daily.    . diphenhydramine-acetaminophen (TYLENOL PM) 25-500 MG TABS Take 0.5 tablets by mouth at bedtime as needed (For sleep.).     [name] ibuprofen (ADVIL,MOTRIN) 200 MG tablet Take 200 mg by mouth every 8 (eight) hours as needed for headache or mild pain.    . traMADol (ULTRAM) 50 MG tablet Take 1-2 tablets (50-100 mg total) by mouth every 6 (six) hours as needed for moderate pain or severe pain. 30 tablet 0     Allergies  Allergen Reactions  . Lisinopril  Cough    Disposition: 01-Home or Self Care  Discharge Instructions    Call MD for:  extreme fatigue    Complete by:  As directed      Call MD for:  hives    Complete by:  As directed      Call MD for:  persistant nausea and vomiting    Complete by:  As directed      Call MD for:  redness, tenderness, or signs of infection (pain, swelling, redness, odor or green/yellow discharge around incision site)    Complete by:  As directed      Call MD for:  severe uncontrolled pain    Complete by:  As directed      Call MD for:    Complete by:  As directed   Temperature > 101.66F     Diet - low sodium heart healthy    Complete by:  As directed      Discharge instructions    Complete by:  As directed   Please see discharge instruction sheets.  Also refer to handout given an office.  Please call our office if you have any questions or concerns [phone]     Discharge wound care:    Complete by:  As directed   If you have closed incisions, shower and bathe over these incisions with soap and water every day.  Remove all surgical dressings on postoperative day #3.  You do not need to replace dressings over the closed incisions unless you feel more comfortable with a Band-Aid covering it.   If you have an open wound that requires packing, please see wound care instructions.  In general, remove all dressings, wash wound with soap and water and then replace with saline moistened gauze.  Do the dressing change at least every day.  Please call our office [phone] if you have further questions.     Driving Restrictions    Complete by:  As directed   No driving until off narcotics and can safely swerve away without pain during an emergency     Increase activity slowly    Complete by:  As directed   Walk an hour a day.  Use 20-30 minute walks.  When you can walk 30 minutes without difficulty, it is fine to restart low impact/moderate activities such as biking, jogging, swimming, sexual activity,  etc.  Eventually you can increase to unrestricted activity when not feeling pain.  If you feel pain:  STOP!.   Let pain protect you from overdoing it.  Use ice/heat & over-the-counter pain medications to help minimize soreness.  If that is not enough, then use your narcotic pain prescription as needed to remain active.  It is better to take extra pain medications and be more active than to stay bedridden to avoid all pain medications.     Lifting restrictions    Complete by:  As directed   Avoid heavy lifting initially.  Do not push through pain.  You have no specific weight limit - if it hurts to do, DON'T DO IT.   If you feel no pain, you are not injuring anything.  Pain will protect you from injury.  Coughing and sneezing are far more stressful to your incision than any lifting.  Avoid resuming heavy lifting / intense activity until off all narcotic pain medications.  When ready to exercise more, give yourself 2 weeks to gradually get back to full intense exercise/activity.     May shower / Bathe    Complete by:  As directed      May walk up steps    Complete by:  As directed      Sexual Activity Restrictions    Complete by:  As directed   Sexual activity as tolerated.  Do not push through pain.  Pain will protect you from injury.     Walk with assistance    Complete by:  As directed   Walk over an hour a day.  May use a walker/cane/companion to help with balance and stamina.            Medication List    TAKE these medications        aspirin 81 MG tablet  Take 81 mg by mouth daily.     atorvastatin 20 MG tablet  Commonly known as:  LIPITOR  TAKE 1 TABLET EACH DAY.     cholecalciferol 1000 units tablet  Commonly known as:  VITAMIN D  Take 1,000 Units by mouth daily.     diphenhydramine-acetaminophen 25-500 MG Tabs tablet  Commonly known as:  TYLENOL PM  Take 0.5 tablets by mouth at bedtime as needed (For sleep.).     ibuprofen 200 MG tablet  Commonly known as:  ADVIL,MOTRIN   Take 200 mg by mouth every 8 (eight) hours as needed for headache or mild pain.     traMADol 50 MG tablet  Commonly known as:  ULTRAM  Take 1-2 tablets (50-100 mg total) by mouth every 6 (six) hours as needed for moderate pain or severe pain.           Follow-up Information    Follow up with Rosario Adie., MD. Schedule an appointment as soon as possible for a visit in 2 weeks.   Specialty:  General Surgery   Contact information:   [place] [address] [phone]       Follow up with Lyda Jester, PA-C On 02/19/2016.   Specialties:  Cardiology, Radiology   Why:  See provider at 11:30 am, please arrive 15 minutes early for paperwork.   Contact information:   [address] [place] [address] [phone]       Follow up with [facility] On 01/23/2016.   Specialty:  Cardiology   Why:  See Stanford Scotland at noon to get the monitor placed. Call [phone] for any problems or concerns.    Contact information:  [address], [address] [place] [phone]       Signed: Morton Peters, M.D., F.A.C.S. Gastrointestinal and Minimally Invasive Surgery [facility] [address] [place], [place] [phone] Main / Paging   01/23/2016, 7:46 AM

## 2016-02-06 ENCOUNTER — Ambulatory Visit: Payer: Medicare Other | Admitting: Cardiology

## 2016-02-14 ENCOUNTER — Telehealth: Payer: Self-pay | Admitting: Gastroenterology

## 2016-02-14 ENCOUNTER — Telehealth: Payer: Self-pay | Admitting: Cardiology

## 2016-02-14 NOTE — Telephone Encounter (Signed)
Left message to call back. Spoke with Valetta Fuller in [employer]/monitor tech and she said monitor should be ready by pt's appt on 4/3.

## 2016-02-14 NOTE — Telephone Encounter (Signed)
New Message:  Pt called in wanting to know if her results to her heart monitor would be available by the time she comes in to see Brittainy. Please f/u

## 2016-02-15 NOTE — Telephone Encounter (Signed)
Spoke with pt and informed that one of our monitor techs said that monitor results should be ready by pt's appt on 4/3.

## 2016-02-15 NOTE — Telephone Encounter (Signed)
Follow up ° ° °Pt returning rn call °

## 2016-02-15 NOTE — Telephone Encounter (Signed)
Surgery 01/19/16. Developed diarrhea with MiraLax and was advised by the surgeon to change to Benefiber. She is now bloated and has not had a bm in 2 days. She states she added Miralax back unsuccessfully. States she takes a teaspoon. Discussed increasing the Miralax to achieve a bowel movement, but decreasing it if she develops diarrhea. Call back if she fails to improve.

## 2016-02-15 NOTE — Telephone Encounter (Signed)
Left message to call back  

## 2016-02-19 ENCOUNTER — Ambulatory Visit (INDEPENDENT_AMBULATORY_CARE_PROVIDER_SITE_OTHER): Payer: Medicare Other | Admitting: Cardiology

## 2016-02-19 ENCOUNTER — Encounter: Payer: Self-pay | Admitting: Cardiology

## 2016-02-19 VITALS — BP 148/80 | HR 66 | Ht 61.0 in | Wt 118.0 lb

## 2016-02-19 DIAGNOSIS — I9789 Other postprocedural complications and disorders of the circulatory system, not elsewhere classified: Secondary | ICD-10-CM | POA: Diagnosis not present

## 2016-02-19 DIAGNOSIS — I4891 Unspecified atrial fibrillation: Secondary | ICD-10-CM | POA: Diagnosis not present

## 2016-02-19 MED ORDER — LOSARTAN POTASSIUM 25 MG PO TABS: 25.0000 mg | ORAL_TABLET | Freq: Every day | ORAL | Status: DC

## 2016-02-19 MED ORDER — ATORVASTATIN CALCIUM 20 MG PO TABS: ORAL_TABLET | ORAL | Status: DC

## 2016-02-19 NOTE — Patient Instructions (Addendum)
Medication Instructions:  Your physician has recommended you make the following change in your medication: DECREASE LOSARTAN TO 25 MG AT NIGHT  Labwork: NONE  Testing/Procedures: NONE  Follow-Up: Your physician wants you to follow-up in: [place] DR. Aundra Dubin You will receive a reminder letter in the mail two months in advance. If you don't receive a letter, please call our office to schedule the follow-up appointment.  Any Other Special Instructions Will Be Listed Below (If Applicable).     If you need a refill on your cardiac medications before your next appointment, please call your pharmacy.

## 2016-02-19 NOTE — Progress Notes (Signed)
02/19/2016 [name]   15-Apr-1941  [id number]  Primary Physician Kelton Pillar, MD Primary Cardiologist: Dr. Aundra Dubin   Reason for Visit/CC: [facility] F/u for a postoperative atrial fibrillation  HPI:  75 year old female with history of labile hypertension, occasional orthostatic type symptoms, hyperlipidemia who underwent colonoscopy for constipation which discovered a single polyp at the appendiceal orifice. Biopsy showed adenoma with high-grade dysplasia. There was concern for possible invasive disease. Laparoscopic appendectomy was performed on 01/19/16.  Postoperatively, she felt nausea, poor intake, mild confusion then developed atrial fibrillation with rapid ventricular response. There was no prior h/o afib. Cardiology was consulted and she was placed on IV dilt and her rate improved ans she converted to NSR in less than 24 hrs. Her CHA2DS2 VASc score was calculated at 4 for age >81, HTN and female sex. However, given recent operation and risk for bleeding and afib duration of < 24 hrs anticoagulation was held. 2D echo was ordered which showed a normal EF of 55-60%, normal wall motion with no significant vale abnormalities. Once stabilized and ready for discharge, decision was made to not discharge on oral anticoagulation. It was decided to monitor with a cardiac monitor post discharge to assess for recurrent afib, prior to determining need for anticoagulation.   The patient's monitoring period was 01/23/2016 - 02/12/2016. Baseline sample showed Sinus Rhythm w/PACs with an average heart rate of 63.4 bpm. There were 0 critical, 0 serious, and 6 stable events that occurred. She denies any symptoms.   She reports that she has done well since discharge. She denies any recurrent symptoms of afib. She has been cleared by her surgeon. She takes low dose ASA daily. Physical exam reveals RRR.    Current Outpatient Prescriptions  Medication Sig Dispense Refill  . aspirin 81 MG tablet Take  81 mg by mouth daily.      [name] atorvastatin (LIPITOR) 20 MG tablet TAKE 1 TABLET EACH DAY. (Patient taking differently: Take 20 mg by mouth daily. TAKE 1 TABLET EACH DAY.) 90 tablet 3  . cholecalciferol (VITAMIN D) 1000 UNITS tablet Take 1,000 Units by mouth daily.    . diphenhydramine-acetaminophen (TYLENOL PM) 25-500 MG TABS Take 0.5 tablets by mouth at bedtime as needed (For sleep.).     [name] ibuprofen (ADVIL,MOTRIN) 200 MG tablet Take 200 mg by mouth every 8 (eight) hours as needed for headache or mild pain.    . traMADol (ULTRAM) 50 MG tablet Take 1-2 tablets (50-100 mg total) by mouth every 6 (six) hours as needed for moderate pain or severe pain. 30 tablet 0   No current facility-administered medications for this visit.    Allergies  Allergen Reactions  . Lisinopril Cough    Social History   Social History  . Marital Status: Married    Spouse Name: N/A  . Number of Children: 5  . Years of Education: N/A   Occupational History  . travel agent    Social History Main Topics  . Smoking status: Never Smoker   . Smokeless tobacco: Never Used  . Alcohol Use: 3.0 oz/week    5 Glasses of wine per week     Comment: occ. wine  . Drug Use: No  . Sexual Activity: No   Other Topics Concern  . Not on file   Social History Narrative   HH of 5    Kids at home now   Married   travel agent.    2-3 c coffee in am    ocass  wine    G3P2vaginal delivery   Pt cell [phone]              Review of Systems: General: negative for chills, fever, night sweats or weight changes.  Cardiovascular: negative for chest pain, dyspnea on exertion, edema, orthopnea, palpitations, paroxysmal nocturnal dyspnea or shortness of breath Dermatological: negative for rash Respiratory: negative for cough or wheezing Urologic: negative for hematuria Abdominal: negative for nausea, vomiting, diarrhea, bright red blood per rectum, melena, or hematemesis Neurologic: negative for visual changes, syncope, or  dizziness All other systems reviewed and are otherwise negative except as noted above.    Blood pressure 148/80, pulse 66, height 5\' 1"  (1.549 m), weight 118 lb (53.524 kg), SpO2 99 %.  General appearance: alert, cooperative and no distress Neck: no carotid bruit and no JVD Lungs: clear to auscultation bilaterally Heart: regular rate and rhythm, S1, S2 normal, no murmur, click, rub or gallop Extremities: no LEE Pulses: 2+ and symmetric Skin: warm and dry Neurologic: Grossly normal  EKG not performed but cardiac monitored reviewed. RRR on exam.   ASSESSMENT AND PLAN:   1. Post Operative Afib: arrhythmia occurred post operatively after GI surgery and lasted <24 hrs. There is no other history of afib. Monitor post discharged showed Sinus Rhythm w/PACs with an average heart rate of 63.4 bpm. No afib or flutter was captured. Given no afib has occurred outside the context of acute surgery, no indication for anticoagulation therapy. However if she develops recurrent afib later down the road, she will require oral anticoagulation as her CHA2DS2 VASc score is 4. I recommended that she continue daily low dose ASA and to notify our office if any symptoms of recurrent atrial fibrillation.   2. Hypertension: Patient has a history of labile systolic pressures ranging from the 110s-140s. She develops orthostatic like symptoms with lower systolic pressures. She feels best when her blood pressures are higher. She is only on one antihypertensive, low-dose losartan 50 mg daily. She feels that 50 mg may be too much, based on her home readings. We will have her try 25 mg/day. I recommended that she continue to take this medication at night to eliminate symptoms during the day. Also recommended use of compression stockings to help with reduce orthostatic symptoms.  3. Hyperlipidemia: On low-dose Lipitor. Lipid panel is followed by her PCP. Last lipid panel demonstrated satisfactory levels with LDL at goal below  100.   PLAN  F/u with Dr. Aundra Dubin in 6 months.   Lyda Jester PA-C 02/19/2016 11:53 AM

## 2016-03-05 DIAGNOSIS — K529 Noninfective gastroenteritis and colitis, unspecified: Secondary | ICD-10-CM | POA: Diagnosis not present

## 2016-03-05 DIAGNOSIS — I10 Essential (primary) hypertension: Secondary | ICD-10-CM | POA: Diagnosis not present

## 2016-03-05 DIAGNOSIS — I480 Paroxysmal atrial fibrillation: Secondary | ICD-10-CM | POA: Diagnosis not present

## 2016-03-05 DIAGNOSIS — K635 Polyp of colon: Secondary | ICD-10-CM | POA: Diagnosis not present

## 2016-03-20 ENCOUNTER — Encounter: Payer: Self-pay | Admitting: Gastroenterology

## 2016-03-21 ENCOUNTER — Ambulatory Visit: Payer: Medicare Other | Admitting: Physician Assistant

## 2016-03-21 DIAGNOSIS — Z79899 Other long term (current) drug therapy: Secondary | ICD-10-CM | POA: Diagnosis not present

## 2016-03-29 DIAGNOSIS — M810 Age-related osteoporosis without current pathological fracture: Secondary | ICD-10-CM | POA: Diagnosis not present

## 2016-05-02 DIAGNOSIS — R350 Frequency of micturition: Secondary | ICD-10-CM | POA: Diagnosis not present

## 2016-05-02 DIAGNOSIS — N390 Urinary tract infection, site not specified: Secondary | ICD-10-CM | POA: Diagnosis not present

## 2016-05-14 DIAGNOSIS — N390 Urinary tract infection, site not specified: Secondary | ICD-10-CM | POA: Diagnosis not present

## 2016-05-22 DIAGNOSIS — K5901 Slow transit constipation: Secondary | ICD-10-CM | POA: Diagnosis not present

## 2016-05-22 DIAGNOSIS — N3946 Mixed incontinence: Secondary | ICD-10-CM | POA: Diagnosis not present

## 2016-05-27 ENCOUNTER — Ambulatory Visit (INDEPENDENT_AMBULATORY_CARE_PROVIDER_SITE_OTHER): Payer: Medicare Other | Admitting: Gastroenterology

## 2016-05-27 ENCOUNTER — Encounter: Payer: Self-pay | Admitting: Gastroenterology

## 2016-05-27 VITALS — BP 116/70 | HR 84 | Ht 61.0 in | Wt 120.0 lb

## 2016-05-27 DIAGNOSIS — K5902 Outlet dysfunction constipation: Secondary | ICD-10-CM | POA: Diagnosis not present

## 2016-05-27 DIAGNOSIS — K589 Irritable bowel syndrome without diarrhea: Secondary | ICD-10-CM | POA: Diagnosis not present

## 2016-05-27 DIAGNOSIS — R159 Full incontinence of feces: Secondary | ICD-10-CM | POA: Diagnosis not present

## 2016-05-27 DIAGNOSIS — R140 Abdominal distension (gaseous): Secondary | ICD-10-CM | POA: Diagnosis not present

## 2016-05-27 MED ORDER — LINACLOTIDE 72 MCG PO CAPS: 72.0000 ug | ORAL_CAPSULE | Freq: Every day | ORAL | Status: DC

## 2016-05-27 NOTE — Patient Instructions (Addendum)
We have given you samples of Linzess 72 mcg to take one daily Use VSL # 3 112 BU daily Use IB Guard daily  Follow up in 6 months We will contact you with your appointment at Physical Therapy at [facility] with Earlie Counts

## 2016-05-28 NOTE — Progress Notes (Signed)
[name]    [id number]    20-Nov-1940  Primary Care Physician:DEBBIE SCHOENHOFF, MD  Referring Physician: Lanice Shirts, MD [address] [place], [place] [address]  Chief complaint:  Constipation alternating with diarrhea and fecal incontinence  HPI: 75 year old female with history of chronic constipation alternating with diarrhea and fecal incontinence here for follow-up visit. she is currently  taking MiraLAX 1 capful daily with no significant improvement, has multiple bowel movements once every 3-4 days. Stopped benefiber as was causing severe bloating.  Colonoscopy in December 2016, had a tubulovillous adenoma with high-grade dysplasia, underwent limited cecal resection. TTG Ab negative for celiac disease Denies any nausea, vomiting, abdominal pain, melena or bright red blood per rectum   Outpatient Encounter Prescriptions as of 05/27/2016  Medication Sig  . aspirin 81 MG tablet Take 81 mg by mouth daily.    [name] atorvastatin (LIPITOR) 20 MG tablet TAKE 1 TABLET EACH DAY.  . cholecalciferol (VITAMIN D) 1000 UNITS tablet Take 1,000 Units by mouth daily.  . diphenhydramine-acetaminophen (TYLENOL PM) 25-500 MG TABS Take 0.5 tablets by mouth at bedtime as needed (For sleep.).   [name] ibuprofen (ADVIL,MOTRIN) 200 MG tablet Take 200 mg by mouth every 8 (eight) hours as needed for headache or mild pain.  [name] losartan (COZAAR) 25 MG tablet Take 1 tablet (25 mg total) by mouth daily.  . polyethylene glycol (MIRALAX / GLYCOLAX) packet Take 17 g by mouth as needed.  . linaclotide (LINZESS) 72 MCG capsule Take 1 capsule (72 mcg total) by mouth daily before breakfast.  . [DISCONTINUED] traMADol (ULTRAM) 50 MG tablet Take 1-2 tablets (50-100 mg total) by mouth every 6 (six) hours as needed for moderate pain or severe pain.   No facility-administered encounter medications on file as of 05/27/2016.    Allergies as of 05/27/2016 - Review Complete 05/27/2016  Allergen  Reaction Noted  . Anesthetics, amide Other (See Comments) 02/20/2016  . Lisinopril Cough 10/05/2009    Past Medical History  Diagnosis Date  . HYPERPARATHYROIDISM UNSPECIFIED 10/20/2007  . HYPERLIPIDEMIA 07/07/2007  . ANEMIA-NOS 07/07/2007  . HYPERTENSION 02/04/2008  . DIVERTICULOSIS, COLON 02/23/2009  . UTI'S, RECURRENT 11/20/2009    kimbrough   . OSTEOPOROSIS 01/01/2008  . SCOLIOSIS 05/25/2009  . FATIGUE 05/25/2009  . PALPITATIONS, OCCASIONAL 07/12/2008  . URINARY INCONTINENCE 07/07/2007  . ADVERSE REACTION TO MEDICATION 07/31/2009  . COLONIC POLYPS, ADENOMATOUS, HX OF   . NEPHROLITHIASIS, HX OF 11/15/2008  . PARATHYROIDECTOMY 01/02/2009  . Seasonal allergies   . Unspecified constipation   . Other acquired absence of organ     parathyroidectomy  . Fever, unspecified   . Abdominal pain, unspecified site   . Disturbance of skin sensation   . Pain in limb   . Chest pain, unspecified   . Family history of diabetes mellitus   . Family history of malignant neoplasm of breast   . Heart murmur     hx of  . Chronic kidney disease     hx of kidney stone  . Arthritis     Past Surgical History  Procedure Laterality Date  . Right oophorectomy    . Parathyroidectomy      Rt Superior open neck exploration  . Colonoscopy    . Polypectomy    . Bunionectomy    . Tonsillectomy    . Laparoscopic appendectomy  01/19/2016    Procedure: APPENDECTOMY LAPAROSCOPIC with orifice of cecal polyp;  Surgeon:  Leighton Ruff, MD;  Location: [facility];  Service: General;;    Family History  Problem Relation Age of Onset  . Breast cancer Other     1st degree relative <50  . Diabetes      1st degree relative  . Sudden death Father 55  . Heart disease Father 7    MI  . Heart disease Mother     valve replacement  . Colon cancer Neg Hx   . Stomach cancer Neg Hx     Social History   Social History  . Marital Status: Married    Spouse Name: N/A  . Number of Children: 3  . Years of Education: N/A    Occupational History  . travel agent    Social History Main Topics  . Smoking status: Never Smoker   . Smokeless tobacco: Never Used  . Alcohol Use: 3.0 oz/week    5 Glasses of wine per week     Comment: occ. wine  . Drug Use: No  . Sexual Activity: No   Other Topics Concern  . Not on file   Social History Narrative   HH of 5    Kids at home now   Married   travel agent.    2-3 c coffee in am    ocass wine    G3P2vaginal delivery   Pt cell [phone]               Review of systems: Review of Systems  Constitutional: Negative for fever and chills.  HENT: Negative.   Eyes: Negative for blurred vision.  Respiratory: Negative for cough, shortness of breath and wheezing.   Cardiovascular: Negative for chest pain and palpitations.  Gastrointestinal: as per HPI Genitourinary: Negative for dysuria, urgency, frequency and hematuria.  Musculoskeletal: Negative for myalgias, back pain and joint pain.  Skin: Negative for itching and rash.  Neurological: Negative for dizziness, tremors, focal weakness, seizures and loss of consciousness.  Endo/Heme/Allergies: Negative for environmental allergies.  Psychiatric/Behavioral: Negative for depression, suicidal ideas and hallucinations.  All other systems reviewed and are negative.   Physical Exam: Filed Vitals:   05/27/16 1453  BP: 116/70  Pulse: 84   Gen:      No acute distress HEENT:  EOMI, sclera anicteric Neck:     No masses; no thyromegaly Lungs:    Clear to auscultation bilaterally; normal respiratory effort CV:         Regular rate and rhythm; no murmurs Abd:      + bowel sounds; soft, non-tender; no palpable masses, no distension Ext:    No edema; adequate peripheral perfusion Skin:      Warm and dry; no rash Neuro: alert and oriented x 3 Psych: normal mood and affect  Data Reviewed:  Reviewed data in epic   Assessment and Plan/Recommendations: 36 yr F with h/o chronic constipation alternating with  diarrhea, likely IBS predominant constipation is here for follow up. Patient also has intermittent fecal incontince with weak anal sphincter, had 1 session of pelvic floor PT and then discontinued when she had surgery with limited cecectomy Will start Linzess 72 mcg daily and titrate dose up based on response Probiotic VSL#3 (112 billion units) 1 capsule daily IB guard as needed for bloating Will refer to PT to complete the session of biofeedback and pelvic floor PT Return in 6 months or sooner    K. Denzil Magnuson , MD [phone] Mon-Fri 8a-5p [phone] after 5p, weekends, holidays  CC: Schoenhoff, Neoma Laming  D, *

## 2016-05-28 NOTE — Addendum Note (Signed)
Addended by: Oda Kilts on: 05/28/2016 01:44 PM   Modules accepted: Orders

## 2016-05-29 ENCOUNTER — Encounter: Payer: Self-pay | Admitting: Physical Therapy

## 2016-05-29 ENCOUNTER — Ambulatory Visit: Payer: Medicare Other | Attending: Gastroenterology | Admitting: Physical Therapy

## 2016-05-29 DIAGNOSIS — R279 Unspecified lack of coordination: Secondary | ICD-10-CM | POA: Insufficient documentation

## 2016-05-29 DIAGNOSIS — M6281 Muscle weakness (generalized): Secondary | ICD-10-CM | POA: Diagnosis not present

## 2016-05-29 NOTE — Therapy (Signed)
[facility] [address] [place], [place], [address] Phone: [phone]   Fax:  [phone]  Physical Therapy Evaluation  Patient Details  Name: [name] MRN: [id number] Date of Birth: 09-May-1941 Referring Provider: Dr. Harl Bowie  Encounter Date: 05/29/2016      PT End of Session - 05/29/16 1058    Visit Number 1   Date for PT Re-Evaluation 07/24/16   Authorization Type Medicare g-code on 10th visit   PT Start Time 1015   PT Stop Time 1056   PT Time Calculation (min) 41 min   Activity Tolerance Patient tolerated treatment well   Behavior During Therapy [facility] for tasks assessed/performed      Past Medical History  Diagnosis Date  . HYPERPARATHYROIDISM UNSPECIFIED 10/20/2007  . HYPERLIPIDEMIA 07/07/2007  . ANEMIA-NOS 07/07/2007  . HYPERTENSION 02/04/2008  . DIVERTICULOSIS, COLON 02/23/2009  . UTI'S, RECURRENT 11/20/2009    kimbrough   . OSTEOPOROSIS 01/01/2008  . SCOLIOSIS 05/25/2009  . FATIGUE 05/25/2009  . PALPITATIONS, OCCASIONAL 07/12/2008  . URINARY INCONTINENCE 07/07/2007  . ADVERSE REACTION TO MEDICATION 07/31/2009  . COLONIC POLYPS, ADENOMATOUS, HX OF   . NEPHROLITHIASIS, HX OF 11/15/2008  . PARATHYROIDECTOMY 01/02/2009  . Seasonal allergies   . Unspecified constipation   . Other acquired absence of organ     parathyroidectomy  . Fever, unspecified   . Abdominal pain, unspecified site   . Disturbance of skin sensation   . Pain in limb   . Chest pain, unspecified   . Family history of diabetes mellitus   . Family history of malignant neoplasm of breast   . Heart murmur     hx of  . Chronic kidney disease     hx of kidney stone  . Arthritis     Past Surgical History  Procedure Laterality Date  . Right oophorectomy    . Parathyroidectomy      Rt Superior open neck exploration  . Colonoscopy    . Polypectomy    . Bunionectomy    . Tonsillectomy    . Laparoscopic appendectomy  01/19/2016    Procedure: APPENDECTOMY LAPAROSCOPIC with orifice of cecal polyp;  Surgeon: Leighton Ruff, MD;  Location: [facility];  Service: General;;    There were no vitals filed for this visit.       Subjective Assessment - 05/29/16 1019    Subjective Patient stopped therapy last year to have surgery on 01/19/2016 for appendectomy and cecal polyp.  She spent 4 days in hospital.  Fecal leakage weekly.  I would like core strengthening. Problems with constipation.    Patient Stated Goals Work on regular bowel movements, increase strength   Currently in Pain? No/denies            [facility] PT Assessment - 05/29/16 0001    Assessment   Medical Diagnosis K59.02 Constipation due to outlet dysfunction; K58.9 IBS; R15.9 Fecal incontinence; R14.0 bloating   Referring Provider Dr. Harl Bowie   Onset Date/Surgical Date 01/19/15   Prior Therapy 2 visits   Precautions   Precautions Other (comment)   Precaution Comments osteoporosis   Restrictions   Weight Bearing Restrictions No   Balance Screen   Has the patient fallen in the past 6 months No   Has the patient had a decrease in activity level because of a fear of falling?  No   Is the patient reluctant to leave their home because of a fear of falling?  No  [place] residence   Prior Function   Level of Independence Independent   Cognition   Overall Cognitive Status Within Functional Limits for tasks assessed   Observation/Other Assessments   Focus on Therapeutic Outcomes (FOTO)  48% limitaiton for bowel leakage   Posture/Postural Control   Posture/Postural Control Postural limitations   Postural Limitations Forward head;Rounded Shoulders;Decreased lumbar lordosis;Increased thoracic kyphosis;Decreased thoracic kyphosis   ROM / Strength   AROM / PROM / Strength Strength   Strength   Overall Strength Comments abdominal strength is 2/5; bil. hip strength is 3+/5   Palpation   Palpation comment tenderness  located in left lower abdominal area, surgical scars have decreased mobility                 Pelvic Floor Special Questions - 05/29/16 0001    Urinary Leakage Yes   Pad use 3 depends   Activities that cause leaking With strong urge;Sneezing;Coughing;Laughing  small amount of leakage   Fecal incontinence Yes   Exam Type Deferred  due to increased fecal leakage today          [facility] Adult PT Treatment/Exercise - 05/29/16 0001    Self-Care   Self-Care Other Self-Care Comments   Other Self-Care Comments  introduction of bowel health, how to introduce fiber into diet and do more insoluble; eating routine                PT Education - 05/29/16 1059    Education provided Yes   Education Details bowel health; walking 10 min per day   Person(s) Educated Patient   Methods Explanation;Verbal cues;Handout   Comprehension Verbalized understanding          PT Short Term Goals - 05/29/16 1059    PT SHORT TERM GOAL #1   Time 4   Status New   PT SHORT TERM GOAL #2   Title understand correct breathing pattern so she does not bear down when coughing and laughing   Time 4   Period Weeks   Status New   PT SHORT TERM GOAL #3   Title understand what bladder irritants and how they affect the bladder   Time 4   Period Weeks   Status New   PT SHORT TERM GOAL #4   Title understand correct bowel health and how to incorportate fiber into her diet   Time 4   Period Weeks   Status New           PT Long Term Goals - 05/29/16 1100    PT LONG TERM GOAL #1   Title independent with HEP and how to progress herself   Time 8   Period Weeks   Status New   PT LONG TERM GOAL #2   Title urinary leakage improved >/= 75% due to increased pelvic floor strength   Time 8   Period Weeks   Status New   PT LONG TERM GOAL #3   Title fecal incontinence decreased >/= 50% due to management of constipation   Time 8   Period Weeks   Status New   PT LONG TERM GOAL #4   Title ability  to stand in line for 15 minutes without having to leave due to fecal incontinence   Time 8   Period Weeks   Status New   PT LONG TERM GOAL #5   Title ability to sit through an 1.5 hour show and not rush to the bathroom due to  increased pelvic floor strength   Time 8   Period Weeks   Status New   Additional Long Term Goals   Additional Long Term Goals Yes   PT LONG TERM GOAL #6   Title wear 1 pad due to reduction in fecal leakage   Time 8   Period Weeks   Status New               Plan - 05/29/16 1357    Clinical Impression Statement Patient is a 75 year old female with diagnosis of fecal incontinence since 01/19/2016. Patient reports no pain. Patient had appendectomy and removal of cecal polyp on 01/19/2016.  Patient has decreased mobility of abdominal scar.  Patient wears 4 depends per day due to fecal leakage.  Patient reports she has urinary leakage with laughing, coughing, and when she has the urge with small amount.  Patient has weakness in bilateral hipa and abdominals.  Patient has osteoporsis and reduction in trunk strength.  Patient sits with a flexed posture which makes it difficult for the pelvic floor to contract correctly.  Patient is of moderate complexity.  Patient wil lbenefit from physical therapy to improve pelvic floor strength and coordination.    Rehab Potential Excellent   Clinical Impairments Affecting Rehab Potential None   PT Frequency 1x / week   PT Duration 8 weeks   PT Treatment/Interventions Biofeedback;Therapeutic activities;Therapeutic exercise;Patient/family education;Neuromuscular re-education;Manual techniques;Scar mobilization   PT Next Visit Plan soft tissue work, pelvic floor assessment with EMG; cardio; abdominal bracing   PT Home Exercise Plan progress as needed   Recommended Other Services None   Consulted and Agree with Plan of Care Patient      Patient will benefit from skilled therapeutic intervention in order to improve the following  deficits and impairments:  Increased fascial restricitons, Decreased mobility, Increased muscle spasms, Decreased endurance, Decreased activity tolerance, Decreased strength  Visit Diagnosis: Muscle weakness (generalized) - Plan: PT plan of care cert/re-cert  Unspecified lack of coordination - Plan: PT plan of care cert/re-cert      G-Codes - [id number]    Functional Assessment Tool Used FOTO score for bowel leakage is 48% limitation   Functional Limitation Other PT primary   Other PT Primary Current Status [id number]) At least 40 percent but less than 60 percent impaired, limited or restricted   Other PT Primary Goal Status [id number]) At least 40 percent but less than 60 percent impaired, limited or restricted       Problem List Patient Active Problem List   Diagnosis Date Noted  . Atrial fibrillation with rapid ventricular response ([place]) 01/21/2016  . Colon polyp s/p appy/partialcecetomy 01/19/3016 01/19/2016  . Dizziness 04/25/2014  . Vertigo 12/26/2012  . Hypertension, uncontrolled 09/11/2012  . Diastolic dysfunction [id number]  . Sleep disturbance 09/17/2011  . Hypotension 12/26/2010  . Hypotension, postural 12/26/2010  . UTI'S, RECURRENT 11/20/2009  . ADVERSE REACTION TO MEDICATION 07/31/2009  . SCOLIOSIS 05/25/2009  . FATIGUE 05/25/2009  . DIVERTICULOSIS, COLON 02/23/2009  . COLONIC POLYPS, ADENOMATOUS, HX OF 02/23/2009  . PARATHYROIDECTOMY 01/02/2009  . NEPHROLITHIASIS, HX OF 11/15/2008  . PALPITATIONS, OCCASIONAL 07/12/2008  . Essential hypertension 02/04/2008  . CONSTIPATION, CHRONIC 01/01/2008  . LEG PAIN, LEFT 01/01/2008  . OSTEOPOROSIS 01/01/2008  . HYPERPARATHYROIDISM UNSPECIFIED 10/20/2007  . HYPERLIPIDEMIA 07/07/2007  . ANEMIA-NOS 07/07/2007  . URINARY INCONTINENCE 07/07/2007    Earlie Counts, PT 05/29/2016 4:01 PM    [facility] [address] [place],  [place], [address] Phone: [phone]    Fax:  [phone]  Name: [name] MRN: [id number] Date of Birth: 02/06/1941

## 2016-05-29 NOTE — Patient Instructions (Signed)
Types of Fiber  There are two main types of fiber:  insoluble and soluble.  Both of these types can prevent and relieve constipation and diarrhea, although some people find one or the other to be more easily digested.  This handout details information about both types of fiber.  Insoluble Fiber       Functions of Insoluble Fiber . moves bulk through the intestines  . controls and balances the pH (acidity) in the intestines       Benefits of Insoluble Fiber . promotes regular bowel movement and prevents constipation  . removes fecal waste through colon in less time  . keeps an optimal pH in intestines to prevent microbes from producing cancer substances, therefore preventing colon cancer        Food Sources of Insoluble Fiber . whole-wheat products  . wheat bran "miller's bran" . corn bran  . flax seed or other seeds . vegetables such as green beans, broccoli, cauliflower and potato skins  . fruit skins and root vegetable skins  . popcorn . brown rice  Soluble Fiber       Functions of Soluble Fiber  . holds water in the colon to bulk and soften the stool . prolongs stomach emptying time so that sugar is released and absorbed more slowly        Benefits of Soluble Fiber . lowers total cholesterol and LDL cholesterol (the bad cholesterol) therefore reducing the risk of heart disease  . regulates blood sugar for people with diabetes       Food Sources of Soluble Fiber . oat/oat bran . dried beans and peas  . nuts  . barley  . flax seed or other seeds . fruits such as oranges, pears, peaches, and apples  . vegetables such as carrots  . psyllium husk  . Prunes  Introduction to Fiber Fiber Overview Dietary fiber is the part of plants that can't be digested. There are 2 kinds of dietary fiber: insoluble and soluble.  Insoluble fiber adds bulk to keep foods moving through the digestive system. Soluble fiber holds water, which softens the stool for easy bowel movements. Fiber is  an important part of your diet, even though it passes through your body undigested and has no nutritional value. A high fiber diet can:  . promote regular bowel movements  . treat diverticular disease (inflammation of part of the intestine) and irritable bowel syndrome (abdominal pain, diarrhea, and constipation that come and go) . promote improvement in hemorrhoids, constipation and fecal incontinence  You should have at least 14 grams of fiber for every 1000 calories you eat every day. Read the label on every food package to find out how much fiber a serving of the food will provide. Foods containing more than 20% of the daily value of fiber per serving are considered high in fiber.  Without enough fiber in your diet, you may suffer from:  . constipation  . small, hard, dry bowel movements.   Sources of Fiber Breads, cereals, and pasta made with whole grain flour, and brown rice are high fiber foods. Many breakfast cereals list the bran or fiber content, so it's easy to know which products are high in fiber.  All fruits and vegetables also contain fiber. Dried beans, leafy vegetables, peas, raisins, prunes, apples, and citrus fruits are all especially good sources of fiber. Ask for examples of high-fiber foods (the fiber table and types of fiber handouts) for more resources on fiber.  Additional information on fiber  content in foods, is available at www.caloriecounts.com  Adding Fiber to Your Diet Eating foods that are high in fiber can help relieve some problems with constipation, hemorrhoids, diverticulitis and irritable bowel syndrome. A high-fiber diet can provide long-term health benefits.  Here are some tips to help you easily add fibers to your diet.  Start Slowly Many people notice bloating, cramping or gas when they add fiber to their diet. Making small changes in your diet over a period of time can help prevent this. Start with one of the changes listed below, then wait several days to a  week before making another. If one change doesn't seem to work for you, try a different one. You may have some gas or bloating at first, but your body will adjust in time. It's important to drink more fluids when you increase the amount of fiber you eat. If you don't already drink over 6 glasses of liquid a day, drink at least 2 more glasses of water a day when you increase your fiber intake. Tips for Increasing Fiber . Start your day with a high-fiber breakfast cereal. Check labels on the packages for the amounts of dietary fiber in each brand.  Eat at least 5 servings of fruits and vegetables each day. Fruits and vegetables that are high in fiber include: Apples Oranges Broccoli Cauliflower Berries Pears Brussels sprouts Green peas Figs Prunes Carrots Beans . Include fruits or vegetables with every meal. Use carrot sticks or apple slices for snacks.  . Cooked fiber is just as effective as raw fiber, so incorporate high-fiber foods in your cooking. . When preparing food leave edible skins and seeds, and use whole-grain flours. . Serve fruit-based desserts.  . Replace white bread with whole-grain breads and cereals. Eat brown rice instead of white rice.  . Eat more of the following foods: Bran muffins Brown rice Oatmeal Popcorn Multiple-grain cereals, cooked or dry 100% whole-wheat bread  . Add whole grains and dried beans to casseroles.  . Add 1/4 cup of wheat bran (miller's bran) to foods such as cooked cereal or applesauce or meat loaf. If you still suffer from constipation, talk to your health care provider about fiber laxatives. Psyllium is a soluble fiber that is often used for this purpose. It can be taken in tablet form or as a powder that is mixed in a glass of water. Always read and follow the directions on the label carefully.  Talk to your doctor or pharmacist if you have any concerns about fiber.  Introduction to Bowel Health Diet and daily habits can help you predict when your  bowels will move on a regular basis.  The consistency and quantity of the stool is usually more important than the frequency.  The goal is to have a regular bowel movement that is soft but formed.   Tips on Emptying Regularly . Eat breakfast.  Usually the best time of day for a bowel movement will be a half hour to an hour after eating.  These times are best because the body uses the gastrocolic reflex, a stimulation of bowel motion that occurs with eating, to help produce a bowel movement.  For some people even a simple hot drink in the morning can help the reflex action begin. . Eat all your meals at a predictable time each day.  The bowel functions best when food is introduced at the same regular intervals. . The amount of food eaten at a given time of day should be about the  same size from day to day.  The bowel functions best when food is introduced in similar quantities from day to day. It is fine to have a small breakfast and a large lunch, or vice versa, just be consistent. . Eat two servings of fruit or vegetables and at least one serving of a complex carbohydrates (whole grains such as brown rice, bran, whole wheat bread, or oatmeal) at each meal. . Drink plenty of water-ideally eight glasses a day.  Be sure to increase your water intake if you are increasing fiber into your diet.  Maintain Healthy Habits . Exercise daily.  You may exercise at any time of day, but you may find that bowel function is helped most if the exercise is at a consistent time each day. . Make sure that you are not rushed and have convenient access to a bathroom at your selected time to empty your bowels.     [facility] [address] [place], [place] [address] Phone # [phone] Fax [phone]

## 2016-06-06 ENCOUNTER — Ambulatory Visit: Payer: Medicare Other | Admitting: Physical Therapy

## 2016-06-06 ENCOUNTER — Encounter: Payer: Self-pay | Admitting: Physical Therapy

## 2016-06-06 DIAGNOSIS — R279 Unspecified lack of coordination: Secondary | ICD-10-CM

## 2016-06-06 DIAGNOSIS — M6281 Muscle weakness (generalized): Secondary | ICD-10-CM | POA: Diagnosis not present

## 2016-06-06 NOTE — Patient Instructions (Signed)
Isometric Hold (Hook-Lying)    Lie with hips and knees bent. Slowly inhale, and then exhale. Pull navel toward spine and Hold for _5__ seconds. Continue to breathe in and out during hold. Rest for __5_ seconds. Repeat _10__ times. Do _3__ times a day.   Copyright  VHI. All rights reserved.    Slow Contraction: Gravity Eliminated (Hook-Lying)    Lie with hips and knees bent. Slowly squeeze anus _5__ seconds. Rest for __5_ seconds. Repeat _10__ times. Do _3__ times a day.   Copyright  VHI. All rights reserved.  Quick Contraction: Gravity Eliminated (Hook-Lying)    Lie with hips and knees bent. Quickly squeeze then fully relax pelvic floor. Perform __1 sets of _5_. Rest for __1 seconds between sets. Do _3_ times a day.   Copyright  VHI. All rights reserved.  [facility] [address] [place],  [address] Phone # [phone] Fax [phone]

## 2016-06-06 NOTE — Therapy (Signed)
[facility] [address] [place], [place], [address] Phone: [phone]   Fax:  [phone]  Physical Therapy Treatment  Patient Details  Name: [name] MRN: [id number] Date of Birth: 1941/01/20 Referring Provider: Dr. Harl Bowie  Encounter Date: 06/06/2016      PT End of Session - 06/06/16 1053    Visit Number 2   Date for PT Re-Evaluation 07/24/16   Authorization Type Medicare g-code on 10th visit   PT Start Time 1015   PT Stop Time 1053   PT Time Calculation (min) 38 min   Activity Tolerance Patient tolerated treatment well   Behavior During Therapy [facility] for tasks assessed/performed      Past Medical History  Diagnosis Date  . HYPERPARATHYROIDISM UNSPECIFIED 10/20/2007  . HYPERLIPIDEMIA 07/07/2007  . ANEMIA-NOS 07/07/2007  . HYPERTENSION 02/04/2008  . DIVERTICULOSIS, COLON 02/23/2009  . UTI'S, RECURRENT 11/20/2009    kimbrough   . OSTEOPOROSIS 01/01/2008  . SCOLIOSIS 05/25/2009  . FATIGUE 05/25/2009  . PALPITATIONS, OCCASIONAL 07/12/2008  . URINARY INCONTINENCE 07/07/2007  . ADVERSE REACTION TO MEDICATION 07/31/2009  . COLONIC POLYPS, ADENOMATOUS, HX OF   . NEPHROLITHIASIS, HX OF 11/15/2008  . PARATHYROIDECTOMY 01/02/2009  . Seasonal allergies   . Unspecified constipation   . Other acquired absence of organ     parathyroidectomy  . Fever, unspecified   . Abdominal pain, unspecified site   . Disturbance of skin sensation   . Pain in limb   . Chest pain, unspecified   . Family history of diabetes mellitus   . Family history of malignant neoplasm of breast   . Heart murmur     hx of  . Chronic kidney disease     hx of kidney stone  . Arthritis     Past Surgical History  Procedure Laterality Date  . Right oophorectomy    . Parathyroidectomy      [place] open neck exploration  . Colonoscopy    . Polypectomy    . Bunionectomy    . Tonsillectomy    . Laparoscopic appendectomy  01/19/2016   Procedure: APPENDECTOMY LAPAROSCOPIC with orifice of cecal polyp;  Surgeon: Leighton Ruff, MD;  Location: [facility];  Service: General;;    There were no vitals filed for this visit.      Subjective Assessment - 06/06/16 1021    Subjective I have been walking 10 min. per day.  I have been very tired lately. Now I am more constipated. I was standing and talking to someone when I had an accident. I am off all of the medicine one week ago. The past 2 days I have not have leakage.  I have 3-4 bowel movements per day. I have been eating more vegetables and including fruit.     Patient Stated Goals Work on regular bowel movements, increase strength   Currently in Pain? No/denies                         [facility] Adult PT Treatment/Exercise - 06/06/16 0001    Manual Therapy   Manual Therapy Soft tissue mobilization;Myofascial release   Soft tissue mobilization abdominal massage to promote movement of bowels   Myofascial Release Pulling up of abdominal tissue reduce different planes of restriction                PT Education - 06/06/16 1052    Education provided Yes   Education Details pelvic floor  contraction, abdominal bracing   Person(s) Educated Patient   Methods Explanation;Demonstration;Verbal cues;Handout   Comprehension Returned demonstration;Verbalized understanding          PT Short Term Goals - 06/06/16 1056    PT SHORT TERM GOAL #1   Title understand how to perform abdominal massage to assist in managing her constipation   Time 4   Period Weeks   Status Achieved   PT SHORT TERM GOAL #2   Title understand correct breathing pattern so she does not bear down when coughing and laughing   Time 4   Period Weeks   Status New   PT SHORT TERM GOAL #3   Title understand what bladder irritants and how they affect the bladder   Time 4   Period Weeks   Status New   PT SHORT TERM GOAL #4   Title understand correct bowel health and how to incorportate fiber  into her diet   Time 4   Period Weeks   Status Achieved           PT Long Term Goals - 05/29/16 1100    PT LONG TERM GOAL #1   Title independent with HEP and how to progress herself   Time 8   Period Weeks   Status New   PT LONG TERM GOAL #2   Title urinary leakage improved >/= 75% due to increased pelvic floor strength   Time 8   Period Weeks   Status New   PT LONG TERM GOAL #3   Title fecal incontinence decreased >/= 50% due to management of constipation   Time 8   Period Weeks   Status New   PT LONG TERM GOAL #4   Title ability to stand in line for 15 minutes without having to leave due to fecal incontinence   Time 8   Period Weeks   Status New   PT LONG TERM GOAL #5   Title ability to sit through an 1.5 hour show and not rush to the bathroom due to increased pelvic floor strength   Time 8   Period Weeks   Status New   Additional Long Term Goals   Additional Long Term Goals Yes   PT LONG TERM GOAL #6   Title wear 1 pad due to reduction in fecal leakage   Time 8   Period Weeks   Status New               Plan - 06/06/16 1053    Clinical Impression Statement Patient has not had fecal leakage for 2 days due to removing medications. Patient has been walking routinely for 10 min daily.  Patient had some tightness in abdomen which was released  after the soft tissue work and good bowel sounds happened. Patient will benefit from skilled therapy to improve strength.    Rehab Potential Excellent   Clinical Impairments Affecting Rehab Potential None   PT Frequency 1x / week   PT Duration 8 weeks   PT Treatment/Interventions Biofeedback;Therapeutic activities;Therapeutic exercise;Patient/family education;Neuromuscular re-education;Manual techniques;Scar mobilization   PT Next Visit Plan soft tissue work, pelvic floor assessment with EMG; hip strength   PT Home Exercise Plan progress as needed   Consulted and Agree with Plan of Care Patient      Patient will  benefit from skilled therapeutic intervention in order to improve the following deficits and impairments:  Increased fascial restricitons, Decreased mobility, Increased muscle spasms, Decreased endurance, Decreased activity tolerance, Decreased strength  Visit Diagnosis:  Muscle weakness (generalized)  Unspecified lack of coordination     Problem List Patient Active Problem List   Diagnosis Date Noted  . Atrial fibrillation with rapid ventricular response ([place]) 01/21/2016  . Colon polyp s/p appy/partialcecetomy 01/19/3016 01/19/2016  . Dizziness 04/25/2014  . Vertigo 12/26/2012  . Hypertension, uncontrolled 09/11/2012  . Diastolic dysfunction [id number]  . Sleep disturbance 09/17/2011  . Hypotension 12/26/2010  . Hypotension, postural 12/26/2010  . UTI'S, RECURRENT 11/20/2009  . ADVERSE REACTION TO MEDICATION 07/31/2009  . SCOLIOSIS 05/25/2009  . FATIGUE 05/25/2009  . DIVERTICULOSIS, COLON 02/23/2009  . COLONIC POLYPS, ADENOMATOUS, HX OF 02/23/2009  . PARATHYROIDECTOMY 01/02/2009  . NEPHROLITHIASIS, HX OF 11/15/2008  . PALPITATIONS, OCCASIONAL 07/12/2008  . Essential hypertension 02/04/2008  . CONSTIPATION, CHRONIC 01/01/2008  . LEG PAIN, LEFT 01/01/2008  . OSTEOPOROSIS 01/01/2008  . HYPERPARATHYROIDISM UNSPECIFIED 10/20/2007  . HYPERLIPIDEMIA 07/07/2007  . ANEMIA-NOS 07/07/2007  . URINARY INCONTINENCE 07/07/2007    Earlie Counts, PT 06/06/2016 10:57 AM   [facility] [address] [place], [place], [address] Phone: [phone]   Fax:  [phone]  Name: [name] MRN: [id number] Date of Birth: 1941-05-29

## 2016-06-12 ENCOUNTER — Encounter: Payer: Self-pay | Admitting: Physical Therapy

## 2016-06-12 ENCOUNTER — Ambulatory Visit: Payer: Medicare Other | Admitting: Physical Therapy

## 2016-06-12 DIAGNOSIS — M6281 Muscle weakness (generalized): Secondary | ICD-10-CM | POA: Diagnosis not present

## 2016-06-12 DIAGNOSIS — R279 Unspecified lack of coordination: Secondary | ICD-10-CM | POA: Diagnosis not present

## 2016-06-12 NOTE — Therapy (Signed)
[facility] [address] [place], [place], [address] Phone: [phone]   Fax:  [phone]  Physical Therapy Treatment  Patient Details  Name: [name] MRN: [id number] Date of Birth: 08-20-1941 Referring Provider: Dr. Harl Bowie  Encounter Date: 06/12/2016      PT End of Session - 06/12/16 1107    Visit Number 3   Number of Visits 10   Date for PT Re-Evaluation 07/24/16   Authorization Type Medicare g-code on 10th visit   PT Start Time 1100   PT Stop Time 1140   PT Time Calculation (min) 40 min   Activity Tolerance Patient tolerated treatment well   Behavior During Therapy [facility] for tasks assessed/performed      Past Medical History:  Diagnosis Date  . Abdominal pain, unspecified site   . ADVERSE REACTION TO MEDICATION 07/31/2009  . ANEMIA-NOS 07/07/2007  . Arthritis   . Chest pain, unspecified   . Chronic kidney disease    hx of kidney stone  . COLONIC POLYPS, ADENOMATOUS, HX OF   . Disturbance of skin sensation   . DIVERTICULOSIS, COLON 02/23/2009  . Family history of diabetes mellitus   . Family history of malignant neoplasm of breast   . FATIGUE 05/25/2009  . Fever, unspecified   . Heart murmur    hx of  . HYPERLIPIDEMIA 07/07/2007  . HYPERPARATHYROIDISM UNSPECIFIED 10/20/2007  . HYPERTENSION 02/04/2008  . NEPHROLITHIASIS, HX OF 11/15/2008  . OSTEOPOROSIS 01/01/2008  . Other acquired absence of organ    parathyroidectomy  . Pain in limb   . PALPITATIONS, OCCASIONAL 07/12/2008  . PARATHYROIDECTOMY 01/02/2009  . SCOLIOSIS 05/25/2009  . Seasonal allergies   . Unspecified constipation   . URINARY INCONTINENCE 07/07/2007  . UTI'S, RECURRENT 11/20/2009   kimbrough     Past Surgical History:  Procedure Laterality Date  . BUNIONECTOMY    . COLONOSCOPY    . LAPAROSCOPIC APPENDECTOMY  01/19/2016   Procedure: APPENDECTOMY LAPAROSCOPIC with orifice of cecal polyp;  Surgeon: Leighton Ruff, MD;   Location: [facility];  Service: General;;  . [place] open neck exploration  . POLYPECTOMY    . RIGHT OOPHORECTOMY    . TONSILLECTOMY      There were no vitals filed for this visit.      Subjective Assessment - 06/12/16 1107    Subjective I had a bowel movement on Monday that was formed and regular, 4 times that day. This morning it was very small and unable to feel it. I have not had fecal leakage.    Patient Stated Goals Work on regular bowel movements, increase strength   Currently in Pain? No/denies                         [facility] Adult PT Treatment/Exercise - 06/12/16 0001      Posture/Postural Control   Posture Comments postural education on being upright while walking to contract pelvic floor correctly, breathing as contracting pelvic floor, walking slowly while contracting  pelvic floor     Exercises   Exercises Other Exercises  pelvic floor exercise with walking 10 min on different surfc   Other Exercises  pelvic floor exercies with stepping up a curb and down     Manual Therapy   Manual Therapy Soft tissue mobilization;Myofascial release   Soft tissue mobilization abdominal massage to promote movement of bowels   Myofascial Release myofascial release to sac  of douglas, around surgical site to release tissue                  PT Short Term Goals - 06/12/16 1108      PT SHORT TERM GOAL #1   Title understand how to perform abdominal massage to assist in managing her constipation   Time 4   Period Weeks   Status Achieved     PT SHORT TERM GOAL #2   Title understand correct breathing pattern so she does not bear down when coughing and laughing   Time 4   Period Weeks   Status Achieved     PT SHORT TERM GOAL #3   Title understand what bladder irritants and how they affect the bladder   Time 4   Period Weeks   Status Achieved     PT SHORT TERM GOAL #4   Title understand correct bowel health and how to incorportate  fiber into her diet   Time 4   Period Weeks   Status Achieved           PT Long Term Goals - 06/12/16 1111      PT LONG TERM GOAL #1   Title independent with HEP and how to progress herself   Time 8   Period Weeks   Status New     PT LONG TERM GOAL #2   Title urinary leakage improved >/= 75% due to increased pelvic floor strength   Time 8   Period Weeks   Status New     PT LONG TERM GOAL #3   Title fecal incontinence decreased >/= 50% due to management of constipation   Time 8   Period Weeks   Status On-going  none since last visit     PT LONG TERM GOAL #4   Title ability to stand in line for 15 minutes without having to leave due to fecal incontinence   Time 8   Period Weeks   Status On-going     PT LONG TERM GOAL #5   Title ability to sit through an 1.5 hour show and not rush to the bathroom due to increased pelvic floor strength   Time 8   Period Weeks   Status Achieved     PT LONG TERM GOAL #6   Title wear 1 pad due to reduction in fecal leakage   Time 8   Period Weeks   Status New               Plan - 06/12/16 1143    Clinical Impression Statement Patient has not had fecal leakage since last visit.  Patient is able to have fully formed bowels.  Patient has learned how to contract pelvic floor while walking and control the contraction.  Patient still had restrictions in the abdominal area.  Patient has met her STG's.  Patient will benefit form physical therapy to improve pelvic floor strength.    Rehab Potential Excellent   Clinical Impairments Affecting Rehab Potential None   PT Frequency 1x / week   PT Duration 8 weeks   PT Treatment/Interventions Biofeedback;Therapeutic activities;Therapeutic exercise;Patient/family education;Neuromuscular re-education;Manual techniques;Scar mobilization   PT Next Visit Plan  pelvic floor assessment with EMG; hip strength   PT Home Exercise Plan progress as needed   Consulted and Agree with Plan of Care  Patient      Patient will benefit from skilled therapeutic intervention in order to improve the following deficits and impairments:  Increased fascial restricitons,  Decreased mobility, Increased muscle spasms, Decreased endurance, Decreased activity tolerance, Decreased strength  Visit Diagnosis: Muscle weakness (generalized)  Unspecified lack of coordination     Problem List Patient Active Problem List   Diagnosis Date Noted  . Atrial fibrillation with rapid ventricular response ([place]) 01/21/2016  . Colon polyp s/p appy/partialcecetomy 01/19/3016 01/19/2016  . Dizziness 04/25/2014  . Vertigo 12/26/2012  . Hypertension, uncontrolled 09/11/2012  . Diastolic dysfunction 95/74/7340  . Sleep disturbance 09/17/2011  . Hypotension 12/26/2010  . Hypotension, postural 12/26/2010  . UTI'S, RECURRENT 11/20/2009  . ADVERSE REACTION TO MEDICATION 07/31/2009  . SCOLIOSIS 05/25/2009  . FATIGUE 05/25/2009  . DIVERTICULOSIS, COLON 02/23/2009  . COLONIC POLYPS, ADENOMATOUS, HX OF 02/23/2009  . PARATHYROIDECTOMY 01/02/2009  . NEPHROLITHIASIS, HX OF 11/15/2008  . PALPITATIONS, OCCASIONAL 07/12/2008  . Essential hypertension 02/04/2008  . CONSTIPATION, CHRONIC 01/01/2008  . LEG PAIN, LEFT 01/01/2008  . OSTEOPOROSIS 01/01/2008  . HYPERPARATHYROIDISM UNSPECIFIED 10/20/2007  . HYPERLIPIDEMIA 07/07/2007  . ANEMIA-NOS 07/07/2007  . URINARY INCONTINENCE 07/07/2007    Earlie Counts, PT 06/12/16 11:47 AM   [facility] [address], [place], [place], [address] Phone: [phone]   Fax:  [phone]  Name: [name] MRN: [id number] Date of Birth: July 15, 1941

## 2016-06-14 DIAGNOSIS — H5310 Unspecified subjective visual disturbances: Secondary | ICD-10-CM | POA: Diagnosis not present

## 2016-06-14 DIAGNOSIS — H532 Diplopia: Secondary | ICD-10-CM | POA: Diagnosis not present

## 2016-06-14 DIAGNOSIS — H52203 Unspecified astigmatism, bilateral: Secondary | ICD-10-CM | POA: Diagnosis not present

## 2016-06-20 ENCOUNTER — Encounter: Payer: Medicare Other | Admitting: Physical Therapy

## 2016-06-24 DIAGNOSIS — K582 Mixed irritable bowel syndrome: Secondary | ICD-10-CM | POA: Diagnosis not present

## 2016-06-24 DIAGNOSIS — I10 Essential (primary) hypertension: Secondary | ICD-10-CM | POA: Diagnosis not present

## 2016-06-24 DIAGNOSIS — N3946 Mixed incontinence: Secondary | ICD-10-CM | POA: Diagnosis not present

## 2016-06-27 ENCOUNTER — Ambulatory Visit: Payer: Medicare Other | Attending: Gastroenterology | Admitting: Physical Therapy

## 2016-06-27 ENCOUNTER — Encounter: Payer: Self-pay | Admitting: Physical Therapy

## 2016-06-27 DIAGNOSIS — R279 Unspecified lack of coordination: Secondary | ICD-10-CM | POA: Diagnosis not present

## 2016-06-27 DIAGNOSIS — M6281 Muscle weakness (generalized): Secondary | ICD-10-CM | POA: Diagnosis not present

## 2016-06-27 NOTE — Therapy (Signed)
[facility] [address] [place], [place], [address] Phone: [phone]   Fax:  [phone]  Physical Therapy Treatment  Patient Details  Name: [name] MRN: [id number] Date of Birth: 1941/05/21 Referring Provider: Dr. Harl Bowie  Encounter Date: 06/27/2016      PT End of Session - 06/27/16 1138    Visit Number 4   Number of Visits 10   Date for PT Re-Evaluation 07/24/16   Authorization Type Medicare g-code on 10th visit   PT Start Time 1100   PT Stop Time 1138   PT Time Calculation (min) 38 min   Activity Tolerance Patient tolerated treatment well   Behavior During Therapy [facility] for tasks assessed/performed      Past Medical History:  Diagnosis Date  . Abdominal pain, unspecified site   . ADVERSE REACTION TO MEDICATION 07/31/2009  . ANEMIA-NOS 07/07/2007  . Arthritis   . Chest pain, unspecified   . Chronic kidney disease    hx of kidney stone  . COLONIC POLYPS, ADENOMATOUS, HX OF   . Disturbance of skin sensation   . DIVERTICULOSIS, COLON 02/23/2009  . Family history of diabetes mellitus   . Family history of malignant neoplasm of breast   . FATIGUE 05/25/2009  . Fever, unspecified   . Heart murmur    hx of  . HYPERLIPIDEMIA 07/07/2007  . HYPERPARATHYROIDISM UNSPECIFIED 10/20/2007  . HYPERTENSION 02/04/2008  . NEPHROLITHIASIS, HX OF 11/15/2008  . OSTEOPOROSIS 01/01/2008  . Other acquired absence of organ    parathyroidectomy  . Pain in limb   . PALPITATIONS, OCCASIONAL 07/12/2008  . PARATHYROIDECTOMY 01/02/2009  . SCOLIOSIS 05/25/2009  . Seasonal allergies   . Unspecified constipation   . URINARY INCONTINENCE 07/07/2007  . UTI'S, RECURRENT 11/20/2009   kimbrough     Past Surgical History:  Procedure Laterality Date  . BUNIONECTOMY    . COLONOSCOPY    . LAPAROSCOPIC APPENDECTOMY  01/19/2016   Procedure: APPENDECTOMY LAPAROSCOPIC with orifice of cecal polyp;  Surgeon: Leighton Ruff, MD;   Location: [facility];  Service: General;;  . [place] open neck exploration  . POLYPECTOMY    . RIGHT OOPHORECTOMY    . TONSILLECTOMY      There were no vitals filed for this visit.      Subjective Assessment - 06/27/16 1104    Subjective I have a new pain in right buttocks.    Patient Stated Goals Work on regular bowel movements, increase strength   Currently in Pain? Yes   Pain Score 5    Pain Location Buttocks   Pain Orientation Right   Pain Descriptors / Indicators Sharp;Stabbing   Pain Type Acute pain   Pain Onset In the past 7 days   Aggravating Factors  sitting   Pain Relieving Factors moving around   Multiple Pain Sites No                         [facility] Adult PT Treatment/Exercise - 06/27/16 0001      Manual Therapy   Manual Therapy Soft tissue mobilization;Muscle Energy Technique   Manual therapy comments manually stretched right hamstring, right piriformis, and right posterior hip    Soft tissue mobilization right piriformis, right gluteal, right posterior hip, right quadratus   Muscle Energy Technique to correct right ilium                PT Education -  06/27/16 1138    Education provided Yes   Education Details piriformis stretch   Person(s) Educated Patient   Methods Explanation;Demonstration;Verbal cues;Handout   Comprehension Returned demonstration;Verbalized understanding          PT Short Term Goals - 06/12/16 1108      PT SHORT TERM GOAL #1   Title understand how to perform abdominal massage to assist in managing her constipation   Time 4   Period Weeks   Status Achieved     PT SHORT TERM GOAL #2   Title understand correct breathing pattern so she does not bear down when coughing and laughing   Time 4   Period Weeks   Status Achieved     PT SHORT TERM GOAL #3   Title understand what bladder irritants and how they affect the bladder   Time 4   Period Weeks   Status Achieved     PT SHORT  TERM GOAL #4   Title understand correct bowel health and how to incorportate fiber into her diet   Time 4   Period Weeks   Status Achieved           PT Long Term Goals - 06/27/16 1105      PT LONG TERM GOAL #1   Title independent with HEP and how to progress herself   Time 8   Period Weeks   Status On-going     PT LONG TERM GOAL #2   Title urinary leakage improved >/= 75% due to increased pelvic floor strength   Time 8   Period Weeks   Status Achieved     PT LONG TERM GOAL #3   Title fecal incontinence decreased >/= 50% due to management of constipation   Time 8   Period Weeks   Status On-going  consipated     PT LONG TERM GOAL #4   Title ability to stand in line for 15 minutes without having to leave due to fecal incontinence   Time 8   Period Weeks   Status Achieved     PT LONG TERM GOAL #5   Title ability to sit through an 1.5 hour show and not rush to the bathroom due to increased pelvic floor strength   Time 8   Period Weeks   Status Achieved     PT LONG TERM GOAL #6   Title wear 1 pad due to reduction in fecal leakage   Period Weeks   Status On-going  wears it just incase of urinary not fecal leakage               Plan - 06/27/16 1139    Clinical Impression Statement Patient has not had a fecal accident since last visit.  Patient reports urinary leakage has improved by 75%.  Patient only wears a pad for just incase for urinary leakage not the fecal leakage.  Pelvic in correct alignment after visit and pain decreased to 2/10. Patient will benefit from physical therapy to reduce urinary and fecal leakage and decrease pain.    Rehab Potential Excellent   Clinical Impairments Affecting Rehab Potential None   PT Frequency 1x / week   PT Duration 8 weeks   PT Treatment/Interventions Biofeedback;Therapeutic activities;Therapeutic exercise;Patient/family education;Neuromuscular re-education;Manual techniques;Scar mobilization   PT Next Visit Plan see if  patient is ready for discharge   PT Home Exercise Plan progress as needed   Consulted and Agree with Plan of Care Patient      Patient will  benefit from skilled therapeutic intervention in order to improve the following deficits and impairments:  Increased fascial restricitons, Decreased mobility, Increased muscle spasms, Decreased endurance, Decreased activity tolerance, Decreased strength  Visit Diagnosis: Muscle weakness (generalized)  Unspecified lack of coordination     Problem List Patient Active Problem List   Diagnosis Date Noted  . Atrial fibrillation with rapid ventricular response ([place]) 01/21/2016  . Colon polyp s/p appy/partialcecetomy 01/19/3016 01/19/2016  . Dizziness 04/25/2014  . Vertigo 12/26/2012  . Hypertension, uncontrolled 09/11/2012  . Diastolic dysfunction [id number]  . Sleep disturbance 09/17/2011  . Hypotension 12/26/2010  . Hypotension, postural 12/26/2010  . UTI'S, RECURRENT 11/20/2009  . ADVERSE REACTION TO MEDICATION 07/31/2009  . SCOLIOSIS 05/25/2009  . FATIGUE 05/25/2009  . DIVERTICULOSIS, COLON 02/23/2009  . COLONIC POLYPS, ADENOMATOUS, HX OF 02/23/2009  . PARATHYROIDECTOMY 01/02/2009  . NEPHROLITHIASIS, HX OF 11/15/2008  . PALPITATIONS, OCCASIONAL 07/12/2008  . Essential hypertension 02/04/2008  . CONSTIPATION, CHRONIC 01/01/2008  . LEG PAIN, LEFT 01/01/2008  . OSTEOPOROSIS 01/01/2008  . HYPERPARATHYROIDISM UNSPECIFIED 10/20/2007  . HYPERLIPIDEMIA 07/07/2007  . ANEMIA-NOS 07/07/2007  . URINARY INCONTINENCE 07/07/2007    Earlie Counts, PT 06/27/16 11:43 AM   [facility] [address] [place], [place], [address] Phone: [phone]   Fax:  [phone]  Name: BREINA GEER MRN: [id number] Date of Birth: Sep 03, 1941

## 2016-06-27 NOTE — Patient Instructions (Signed)
Piriformis Stretch    Lying on back, pull right knee toward opposite shoulder. Hold _30___ seconds. Repeat __2__ times. Do __2__ sessions per day. ONly a comfortable stretch http://[email or web address].exer.us/258   Copyright  VHI. All rights reserved.  [facility] [address] [place], [place] [address] Phone # [phone] Fax [phone]

## 2016-06-28 DIAGNOSIS — Z803 Family history of malignant neoplasm of breast: Secondary | ICD-10-CM | POA: Diagnosis not present

## 2016-06-28 DIAGNOSIS — Z1231 Encounter for screening mammogram for malignant neoplasm of breast: Secondary | ICD-10-CM | POA: Diagnosis not present

## 2016-07-03 ENCOUNTER — Encounter: Payer: Medicare Other | Admitting: Physical Therapy

## 2016-07-11 ENCOUNTER — Encounter: Payer: Medicare Other | Admitting: Physical Therapy

## 2016-07-21 DIAGNOSIS — J029 Acute pharyngitis, unspecified: Secondary | ICD-10-CM | POA: Diagnosis not present

## 2016-07-21 DIAGNOSIS — H9209 Otalgia, unspecified ear: Secondary | ICD-10-CM | POA: Diagnosis not present

## 2016-07-21 DIAGNOSIS — J3489 Other specified disorders of nose and nasal sinuses: Secondary | ICD-10-CM | POA: Diagnosis not present

## 2016-07-25 ENCOUNTER — Ambulatory Visit: Payer: Medicare Other | Admitting: Physical Therapy

## 2016-07-31 ENCOUNTER — Encounter: Payer: Self-pay | Admitting: Physical Therapy

## 2016-07-31 ENCOUNTER — Ambulatory Visit: Payer: Medicare Other | Attending: Gastroenterology | Admitting: Physical Therapy

## 2016-07-31 DIAGNOSIS — M6281 Muscle weakness (generalized): Secondary | ICD-10-CM | POA: Diagnosis not present

## 2016-07-31 DIAGNOSIS — R279 Unspecified lack of coordination: Secondary | ICD-10-CM | POA: Diagnosis not present

## 2016-07-31 NOTE — Therapy (Signed)
[facility] [address], [place], [place], [address] Phone: [phone]   Fax:  [phone]  Physical Therapy Treatment  Patient Details  Name: [name] MRN: [id number] Date of Birth: 04/14/1941 Referring Provider: Dr. Harl Bowie  Encounter Date: 07/31/2016      PT End of Session - 07/31/16 1213    Visit Number 5   Date for PT Re-Evaluation 07/31/16   Authorization Type Medicare g-code on 10th visit   PT Start Time 1150   PT Stop Time 1228   PT Time Calculation (min) 38 min   Activity Tolerance Patient tolerated treatment well   Behavior During Therapy [facility] for tasks assessed/performed      Past Medical History:  Diagnosis Date  . Abdominal pain, unspecified site   . ADVERSE REACTION TO MEDICATION 07/31/2009  . ANEMIA-NOS 07/07/2007  . Arthritis   . Chest pain, unspecified   . Chronic kidney disease    hx of kidney stone  . COLONIC POLYPS, ADENOMATOUS, HX OF   . Disturbance of skin sensation   . DIVERTICULOSIS, COLON 02/23/2009  . Family history of diabetes mellitus   . Family history of malignant neoplasm of breast   . FATIGUE 05/25/2009  . Fever, unspecified   . Heart murmur    hx of  . HYPERLIPIDEMIA 07/07/2007  . HYPERPARATHYROIDISM UNSPECIFIED 10/20/2007  . HYPERTENSION 02/04/2008  . NEPHROLITHIASIS, HX OF 11/15/2008  . OSTEOPOROSIS 01/01/2008  . Other acquired absence of organ    parathyroidectomy  . Pain in limb   . PALPITATIONS, OCCASIONAL 07/12/2008  . PARATHYROIDECTOMY 01/02/2009  . SCOLIOSIS 05/25/2009  . Seasonal allergies   . Unspecified constipation   . URINARY INCONTINENCE 07/07/2007  . UTI'S, RECURRENT 11/20/2009   kimbrough     Past Surgical History:  Procedure Laterality Date  . BUNIONECTOMY    . COLONOSCOPY    . LAPAROSCOPIC APPENDECTOMY  01/19/2016   Procedure: APPENDECTOMY LAPAROSCOPIC with orifice of cecal polyp;  Surgeon: Leighton Ruff, MD;  Location: [facility];  Service:  General;;  . [place] open neck exploration  . POLYPECTOMY    . RIGHT OOPHORECTOMY    . TONSILLECTOMY      There were no vitals filed for this visit.      Subjective Assessment - 07/31/16 1151    Subjective I want to stop therapy due to no changes.  I am doing the walking program. I like the exercises.    Patient Stated Goals Work on regular bowel movements, increase strength   Currently in Pain? Yes   Pain Score 3    Pain Location Abdomen   Pain Orientation Right   Pain Descriptors / Indicators Sharp   Pain Type Acute pain   Pain Onset In the past 7 days   Pain Frequency Intermittent   Aggravating Factors  intermittent   Pain Relieving Factors no sure   Multiple Pain Sites No            [facility] PT Assessment - 07/31/16 0001      Assessment   Medical Diagnosis K59.02 Constipation due to outlet dysfunction; K58.9 IBS; R15.9 Fecal incontinence; R14.0 bloating   Referring Provider Dr. Harl Bowie   Onset Date/Surgical Date 01/19/15   Prior Therapy 2 visits     Precautions   Precautions Other (comment)   Precaution Comments osteoporosis     Restrictions   Weight Bearing Restrictions No     Balance Screen  Has the patient fallen in the past 6 months No   Has the patient had a decrease in activity level because of a fear of falling?  No   Is the patient reluctant to leave their home because of a fear of falling?  No     Home [employer] residence     Prior Function   Level of Independence Independent     Cognition   Overall Cognitive Status Within Functional Limits for tasks assessed     Observation/Other Assessments   Focus on Therapeutic Outcomes (FOTO)  30% limitation due to patient verbally indicating no leakage  patient filled out for prior to therapy instead of now     Posture/Postural Control   Posture/Postural Control Postural limitations   Postural Limitations Forward head;Rounded  Shoulders;Decreased lumbar lordosis;Increased thoracic kyphosis;Decreased thoracic kyphosis     Strength   Overall Strength Comments abdominal strength is 3/5; bil. hip strength is 4/5                  Pelvic Floor Special Questions - 07/31/16 0001    Urinary Leakage Yes   Pad use 1 pad   Activities that cause leaking Coughing;Sneezing;Laughing;With strong urge   Fecal incontinence No   Exam Type Deferred  due to increased fecal leakage today                   PT Education - 07/31/16 1212    Education provided Yes   Education Details reviewed HEP   Person(s) Educated Patient   Methods Explanation   Comprehension Verbalized understanding          PT Short Term Goals - 06/12/16 1108      PT SHORT TERM GOAL #1   Title understand how to perform abdominal massage to assist in managing her constipation   Time 4   Period Weeks   Status Achieved     PT SHORT TERM GOAL #2   Title understand correct breathing pattern so she does not bear down when coughing and laughing   Time 4   Period Weeks   Status Achieved     PT SHORT TERM GOAL #3   Title understand what bladder irritants and how they affect the bladder   Time 4   Period Weeks   Status Achieved     PT SHORT TERM GOAL #4   Title understand correct bowel health and how to incorportate fiber into her diet   Time 4   Period Weeks   Status Achieved           PT Long Term Goals - 07/31/16 1155      PT LONG TERM GOAL #1   Title independent with HEP and how to progress herself   Time 8   Period Weeks   Status Achieved     PT LONG TERM GOAL #2   Title urinary leakage improved >/= 75% due to increased pelvic floor strength   Time 8   Period Weeks   Status Achieved     PT LONG TERM GOAL #3   Title fecal incontinence decreased >/= 50% due to management of constipation   Time 8   Period Weeks   Status Achieved     PT LONG TERM GOAL #4   Title ability to stand in line for 15 minutes  without having to leave due to fecal incontinence   Time 8   Period Weeks   Status Achieved  PT LONG TERM GOAL #5   Title ability to sit through an 1.5 hour show and not rush to the bathroom due to increased pelvic floor strength   Time 8   Period Weeks   Status Achieved     PT LONG TERM GOAL #6   Title wear 1 pad due to reduction in fecal leakage   Time 8   Period Weeks   Status Partially Met  1-2 per day               Plan - 08-22-2016 1214    Clinical Impression Statement Patient has had no fecal incontinence.  Patient reports her urinary incontince is 50% better.  She has gone from 3 depends per day to 1-2 regular pads. Abdominal and lower extremity strength has improved.  Patient is independent with HEP. Patient has a pain in lew   Rehab Potential Excellent   Clinical Impairments Affecting Rehab Potential None   PT Treatment/Interventions Biofeedback;Therapeutic activities;Therapeutic exercise;Patient/family education;Neuromuscular re-education;Manual techniques;Scar mobilization   PT Next Visit Plan Discharge to HEP   PT Home Exercise Plan Current HEP   Consulted and Agree with Plan of Care Patient      Patient will benefit from skilled therapeutic intervention in order to improve the following deficits and impairments:  Increased fascial restricitons, Decreased mobility, Increased muscle spasms, Decreased endurance, Decreased activity tolerance, Decreased strength  Visit Diagnosis: Muscle weakness (generalized)  Unspecified lack of coordination       G-Codes - August 22, 2016 1222    Functional Assessment Tool Used FOTO score for bowel leakage is 48% limitation   Functional Limitation Other PT primary   Other PT Primary Goal Status (D2202) At least 40 percent but less than 60 percent impaired, limited or restricted   Other PT Primary Discharge Status (R4270) At least 20 percent but less than 40 percent impaired, limited or restricted      Problem List Patient  Active Problem List   Diagnosis Date Noted  . Atrial fibrillation with rapid ventricular response ([place]) 01/21/2016  . Colon polyp s/p appy/partialcecetomy 01/19/3016 01/19/2016  . Dizziness 04/25/2014  . Vertigo 12/26/2012  . Hypertension, uncontrolled 09/11/2012  . Diastolic dysfunction 62/37/6283  . Sleep disturbance 09/17/2011  . Hypotension 12/26/2010  . Hypotension, postural 12/26/2010  . UTI'S, RECURRENT 11/20/2009  . ADVERSE REACTION TO MEDICATION 2009/08/22  . SCOLIOSIS 05/25/2009  . FATIGUE 05/25/2009  . DIVERTICULOSIS, COLON 02/23/2009  . COLONIC POLYPS, ADENOMATOUS, HX OF 02/23/2009  . PARATHYROIDECTOMY 01/02/2009  . NEPHROLITHIASIS, HX OF 11/15/2008  . PALPITATIONS, OCCASIONAL 07/12/2008  . Essential hypertension 02/04/2008  . CONSTIPATION, CHRONIC 01/01/2008  . LEG PAIN, LEFT 01/01/2008  . OSTEOPOROSIS 01/01/2008  . HYPERPARATHYROIDISM UNSPECIFIED 10/20/2007  . HYPERLIPIDEMIA 07/07/2007  . ANEMIA-NOS 07/07/2007  . URINARY INCONTINENCE 07/07/2007    Earlie Counts, PT Aug 22, 2016 12:25 PM   [facility] [address], [place], [place], [address] Phone: [phone]   Fax:  [phone]  Name: [name] MRN: [id number] Date of Birth: Apr 28, 1941  PHYSICAL THERAPY DISCHARGE SUMMARY  Visits from Start of Care: 5  Current functional level related to goals / functional outcomes: See above   Remaining deficits: See above. Patient is concerned about constipation when she was on vacation. She had no bowel movement for 6 days.  Patient is concerned about her nose running when she has a bowel movement after being constipated.    Education / Equipment: HEP Plan: Patient agrees to discharge.  Patient goals were  partially met. Patient is being discharged due to meeting the stated rehab goals.  Thank you for the referral. Earlie Counts, PT 07/31/16 12:25 PM  ?????

## 2016-08-01 ENCOUNTER — Telehealth: Payer: Self-pay

## 2016-08-01 NOTE — Telephone Encounter (Signed)
-----   Message from Mauri Pole, MD sent at 08/01/2016  1:39 PM EDT ----- Can you please bring her in for office visit for constipation, seems its worse when she is on vacation Thanks VN ----- Message ----- From: Monico Hoar, PT Sent: 07/31/2016  12:26 PM To: Mauri Pole, MD  Discharge summary

## 2016-08-01 NOTE — Telephone Encounter (Signed)
I left a message for the patient to call back to discuss an appointment.

## 2016-08-02 NOTE — Telephone Encounter (Signed)
Spoke with the patient. The issue of constipation while on vacation is so bad, she considers never traveling again. She agrees to come in to see the doctor. Appointment made.

## 2016-08-13 ENCOUNTER — Encounter (INDEPENDENT_AMBULATORY_CARE_PROVIDER_SITE_OTHER): Payer: Self-pay

## 2016-08-13 ENCOUNTER — Ambulatory Visit (INDEPENDENT_AMBULATORY_CARE_PROVIDER_SITE_OTHER): Payer: Medicare Other | Admitting: Gastroenterology

## 2016-08-13 ENCOUNTER — Encounter: Payer: Self-pay | Admitting: Gastroenterology

## 2016-08-13 VITALS — BP 160/80 | HR 68

## 2016-08-13 DIAGNOSIS — R159 Full incontinence of feces: Secondary | ICD-10-CM | POA: Diagnosis not present

## 2016-08-13 DIAGNOSIS — K5902 Outlet dysfunction constipation: Secondary | ICD-10-CM

## 2016-08-13 MED ORDER — LINACLOTIDE 72 MCG PO CAPS: 72.0000 ug | ORAL_CAPSULE | Freq: Every day | ORAL | 3 refills | Status: DC

## 2016-08-13 NOTE — Progress Notes (Signed)
[name]    [id number]    1941-08-24  Primary Care Physician:DEBBIE SCHOENHOFF, MD  Referring Physician: Lanice Shirts, MD [address] [place], [place] [address]  Chief complaint: Constipation  HPI: 75 year old female with history of chronic constipation alternating with diarrhea and fecal incontinence here for follow-up visit. Last seen in office in July 2017. she took MiraLAX 1 capful daily with no significant improvement, Linzess 72 mcg was giving diarrhea. Currently has multiple bowel movements once every 3-4 days. Stopped benefiber as was causing severe bloating.  Colonoscopy in December 2016, had a tubulovillous adenoma with high-grade dysplasia, underwent limited cecal resection. TTG Ab negative for celiac disease . She had an episode of severe constipation with no BM for >1 week when she was travelling in [place], had to go ER with episode of bilious vomiting. No bowel obstruction.  Denies any nausea, vomiting, abdominal pain, melena or bright red blood per rectum   Outpatient Encounter Prescriptions as of 08/13/2016  Medication Sig  . aspirin 81 MG tablet Take 81 mg by mouth daily.    [name] atorvastatin (LIPITOR) 20 MG tablet TAKE 1 TABLET EACH DAY.  . cholecalciferol (VITAMIN D) 1000 UNITS tablet Take 1,000 Units by mouth daily.  . diphenhydramine-acetaminophen (TYLENOL PM) 25-500 MG TABS Take 0.5 tablets by mouth at bedtime as needed (For sleep.).   [name] ibuprofen (ADVIL,MOTRIN) 200 MG tablet Take 200 mg by mouth every 8 (eight) hours as needed for headache or mild pain.  [name] linaclotide (LINZESS) 72 MCG capsule Take 1 capsule (72 mcg total) by mouth daily before breakfast.  . losartan (COZAAR) 25 MG tablet Take 1 tablet (25 mg total) by mouth daily.  . [DISCONTINUED] polyethylene glycol (MIRALAX / GLYCOLAX) packet Take 17 g by mouth as needed.   No facility-administered encounter medications on file as of 08/13/2016.     Allergies as of  08/13/2016 - Review Complete 08/13/2016  Allergen Reaction Noted  . Anesthetics, amide Other (See Comments) 02/20/2016  . Lisinopril Cough 10/05/2009    Past Medical History:  Diagnosis Date  . Abdominal pain, unspecified site   . ADVERSE REACTION TO MEDICATION 07/31/2009  . ANEMIA-NOS 07/07/2007  . Arthritis   . Chest pain, unspecified   . Chronic kidney disease    hx of kidney stone  . COLONIC POLYPS, ADENOMATOUS, HX OF   . Disturbance of skin sensation   . DIVERTICULOSIS, COLON 02/23/2009  . Family history of diabetes mellitus   . Family history of malignant neoplasm of breast   . FATIGUE 05/25/2009  . Fever, unspecified   . Heart murmur    hx of  . HYPERLIPIDEMIA 07/07/2007  . HYPERPARATHYROIDISM UNSPECIFIED 10/20/2007  . HYPERTENSION 02/04/2008  . NEPHROLITHIASIS, HX OF 11/15/2008  . OSTEOPOROSIS 01/01/2008  . Other acquired absence of organ    parathyroidectomy  . Pain in limb   . PALPITATIONS, OCCASIONAL 07/12/2008  . PARATHYROIDECTOMY 01/02/2009  . SCOLIOSIS 05/25/2009  . Seasonal allergies   . Unspecified constipation   . URINARY INCONTINENCE 07/07/2007  . UTI'S, RECURRENT 11/20/2009   kimbrough     Past Surgical History:  Procedure Laterality Date  . BUNIONECTOMY    . COLONOSCOPY    . LAPAROSCOPIC APPENDECTOMY  01/19/2016   Procedure: APPENDECTOMY LAPAROSCOPIC with orifice of cecal polyp;  Surgeon: Leighton Ruff, MD;  Location: [facility];  Service: General;;  . [place] open neck exploration  .  POLYPECTOMY    . RIGHT OOPHORECTOMY    . TONSILLECTOMY      Family History  Problem Relation Age of Onset  . Sudden death Father 19  . Heart disease Father 21    MI  . Heart disease Mother     valve replacement  . Breast cancer Other     1st degree relative <50  . Diabetes      1st degree relative  . Colon cancer Neg Hx   . Stomach cancer Neg Hx     Social History   Social History  . Marital status: Married    Spouse name: N/A  . Number of  children: 3  . Years of education: N/A   Occupational History  . travel agent Retired   Social History Main Topics  . Smoking status: Never Smoker  . Smokeless tobacco: Never Used  . Alcohol use 3.0 oz/week    5 Glasses of wine per week     Comment: occ. wine  . Drug use: No  . Sexual activity: No   Other Topics Concern  . Not on file   Social History Narrative   HH of 5    Kids at home now   Married   travel agent.    2-3 c coffee in am    ocass wine    G3P2vaginal delivery   Pt cell [phone]               Review of systems: Review of Systems  Constitutional: Negative for fever and chills.  HENT: Positive for sinus problem, hearing difficulty and runny nose Eyes: Negative for blurred vision.  Respiratory: Negative for cough, shortness of breath and wheezing.   Cardiovascular: Negative for chest pain and palpitations.  Gastrointestinal: as per HPI Genitourinary: Negative for dysuria, urgency, frequency and hematuria.  Musculoskeletal: Negative for myalgias, back pain and joint pain.  Skin: Negative for itching and rash.  Neurological: Negative for dizziness, tremors, focal weakness, seizures and loss of consciousness.  Endo/Heme/Allergies: Negative for seasonal allergies.  Psychiatric/Behavioral: Positive for depression, negative for suicidal ideas and hallucinations.  All other systems reviewed and are negative.   Physical Exam: Vitals:   08/13/16 1332  BP: (!) 160/80  Pulse: 68   There is no height or weight on file to calculate BMI. Gen:      No acute distress HEENT:  EOMI, sclera anicteric Neck:     No masses; no thyromegaly Lungs:    Clear to auscultation bilaterally; normal respiratory effort CV:         Regular rate and rhythm; no murmurs Abd:      + bowel sounds; soft, non-tender; no palpable masses, no distension Ext:    No edema; adequate peripheral perfusion Skin:      Warm and dry; no rash Neuro: alert and oriented x 3 Psych: normal mood  and affect  Data Reviewed:  Reviewed labs, radiology imaging, old records and pertinent past GI work up   Assessment and Plan/Recommendations: 60 yr F with h/o chronic constipation alternating with diarrhea in the setting of laxatives and fecal incontinence. Patient likely IBS predominant constipation and also outlet dysfunction Patient has intermittent fecal incontince with weak anal sphincter, had 1 session of pelvic floor PT and then discontinued when she had surgery with limited cecectomy. She is reluctant to undergo additional sessions of pelvic floor PT Restart Benefiber 1 tablespoon TID with meals Will start Linzess 72 mcg daily (instructed patient to open capsule and  use only half the contents) and titrate dose up based on response Probiotic VSL#3 (112 billion units) 1 capsule daily IB guard as needed for bloating Due for surveillance colonoscopy Dec 2017  25 minutes was spent face-to-face with the patient. Greater than 50% of the time used for counseling as well as treatment plan and follow-up. She had multiple questions which were answered to her satisfaction  K. Denzil Magnuson , MD [phone] Mon-Fri 8a-5p [phone] after 5p, weekends, holidays  CC: Schoenhoff, Altamese Cabal, *

## 2016-08-13 NOTE — Patient Instructions (Addendum)
We have sent a new prescription of Linzess to your pharmacy, Use 1/2 of a capsule every other day, titrate based on response   Use Benefiber 1 tablespoon three times a day with meals   Follow up 10/22/2016 at 1:45pm with Dr Silverio Decamp  Today your blood pressure was elevated. Please follow up with your Primary Care Provider for blood pressure management.

## 2016-08-20 DIAGNOSIS — I951 Orthostatic hypotension: Secondary | ICD-10-CM | POA: Diagnosis not present

## 2016-08-20 DIAGNOSIS — E559 Vitamin D deficiency, unspecified: Secondary | ICD-10-CM | POA: Diagnosis not present

## 2016-08-20 DIAGNOSIS — I10 Essential (primary) hypertension: Secondary | ICD-10-CM | POA: Diagnosis not present

## 2016-08-20 DIAGNOSIS — E213 Hyperparathyroidism, unspecified: Secondary | ICD-10-CM | POA: Diagnosis not present

## 2016-08-20 DIAGNOSIS — Z23 Encounter for immunization: Secondary | ICD-10-CM | POA: Diagnosis not present

## 2016-09-02 DIAGNOSIS — R0609 Other forms of dyspnea: Secondary | ICD-10-CM | POA: Diagnosis not present

## 2016-09-02 DIAGNOSIS — R768 Other specified abnormal immunological findings in serum: Secondary | ICD-10-CM | POA: Diagnosis not present

## 2016-09-02 DIAGNOSIS — Z79899 Other long term (current) drug therapy: Secondary | ICD-10-CM | POA: Diagnosis not present

## 2016-09-02 DIAGNOSIS — M810 Age-related osteoporosis without current pathological fracture: Secondary | ICD-10-CM | POA: Diagnosis not present

## 2016-09-30 DIAGNOSIS — M810 Age-related osteoporosis without current pathological fracture: Secondary | ICD-10-CM | POA: Diagnosis not present

## 2016-10-02 ENCOUNTER — Other Ambulatory Visit: Payer: Self-pay | Admitting: Internal Medicine

## 2016-10-02 DIAGNOSIS — R938 Abnormal findings on diagnostic imaging of other specified body structures: Secondary | ICD-10-CM | POA: Diagnosis not present

## 2016-10-02 DIAGNOSIS — R9389 Abnormal findings on diagnostic imaging of other specified body structures: Secondary | ICD-10-CM

## 2016-10-02 DIAGNOSIS — R0609 Other forms of dyspnea: Secondary | ICD-10-CM | POA: Diagnosis not present

## 2016-10-15 ENCOUNTER — Ambulatory Visit
Admission: RE | Admit: 2016-10-15 | Discharge: 2016-10-15 | Disposition: A | Discharge status: Home / Self Care | Payer: Medicare Other | Source: Ambulatory Visit | Attending: Internal Medicine | Admitting: Internal Medicine

## 2016-10-15 DIAGNOSIS — R05 Cough: Secondary | ICD-10-CM | POA: Diagnosis not present

## 2016-10-15 DIAGNOSIS — R9389 Abnormal findings on diagnostic imaging of other specified body structures: Secondary | ICD-10-CM

## 2016-10-15 DIAGNOSIS — R0602 Shortness of breath: Secondary | ICD-10-CM | POA: Diagnosis not present

## 2016-10-18 DIAGNOSIS — R001 Bradycardia, unspecified: Secondary | ICD-10-CM | POA: Diagnosis not present

## 2016-10-18 DIAGNOSIS — R0600 Dyspnea, unspecified: Secondary | ICD-10-CM | POA: Diagnosis not present

## 2016-10-18 DIAGNOSIS — I10 Essential (primary) hypertension: Secondary | ICD-10-CM | POA: Diagnosis not present

## 2016-10-22 ENCOUNTER — Ambulatory Visit: Payer: Medicare Other | Admitting: Gastroenterology

## 2016-10-22 ENCOUNTER — Ambulatory Visit (INDEPENDENT_AMBULATORY_CARE_PROVIDER_SITE_OTHER): Payer: Medicare Other | Admitting: Gastroenterology

## 2016-10-22 ENCOUNTER — Encounter: Payer: Self-pay | Admitting: Gastroenterology

## 2016-10-22 VITALS — BP 148/78 | HR 50 | Ht 61.0 in | Wt 121.0 lb

## 2016-10-22 DIAGNOSIS — K5902 Outlet dysfunction constipation: Secondary | ICD-10-CM

## 2016-10-22 DIAGNOSIS — R140 Abdominal distension (gaseous): Secondary | ICD-10-CM | POA: Diagnosis not present

## 2016-10-22 DIAGNOSIS — R152 Fecal urgency: Secondary | ICD-10-CM

## 2016-10-22 DIAGNOSIS — R159 Full incontinence of feces: Secondary | ICD-10-CM

## 2016-10-22 NOTE — Patient Instructions (Signed)
It has been recommended to you by your physician that you have a(n) Colonoscopy completed. Per your request, we did not schedule the procedure(s) today. Please contact our office at [phone] should you decide to have the procedure completed.  Use Phazyme as needed, this can be purchased over the counter at your local pharmacy

## 2016-10-22 NOTE — Progress Notes (Addendum)
[name]    [id number]    Apr 29, 1941  Primary Care Physician:DEBBIE SCHOENHOFF, MD  Referring Physician: Lanice Shirts, MD [address] [place], [place] [address]  Chief complaint:  Constipation  HPI: 75 year old female with history of chronic constipation alternating with diarrhea and fecal incontinence here for follow-up visit. Last seen in office in September 2017. Stopped benefiber as was causing severe bloating. she took MiraLAX 1 capful daily with no significant improvement, Linzess 72 mcg was giving diarrhea. She had an episode of severe constipation in October and did not have a bowel movement for a week. Currently is having daily bowel movement since she started eating peaches. She continues to have incontinence to gas and liquid stool. Colonoscopy in December 2016, had a tubulovillous adenoma with high-grade dysplasia, underwent limited cecal resection. Denies any nausea, vomiting, abdominal pain, melena or bright red blood per rectum    Outpatient Encounter Prescriptions as of 10/22/2016  Medication Sig  . aspirin 81 MG tablet Take 81 mg by mouth daily.    [name] atorvastatin (LIPITOR) 20 MG tablet TAKE 1 TABLET EACH DAY.  . cholecalciferol (VITAMIN D) 1000 UNITS tablet Take 1,000 Units by mouth daily.  . diphenhydramine-acetaminophen (TYLENOL PM) 25-500 MG TABS Take 0.5 tablets by mouth at bedtime as needed (For sleep.).   [name] ibuprofen (ADVIL,MOTRIN) 200 MG tablet Take 200 mg by mouth every 8 (eight) hours as needed for headache or mild pain.  [name] losartan (COZAAR) 25 MG tablet Take 1 tablet (25 mg total) by mouth daily.  [name] oxybutynin (DITROPAN-XL) 5 MG 24 hr tablet Take 1 tablet by mouth once a week.  . polyethylene glycol powder (GLYCOLAX/MIRALAX) powder Take 0.5 Containers by mouth as needed.   . [DISCONTINUED] linaclotide (LINZESS) 72 MCG capsule Take 1 capsule (72 mcg total) by mouth daily before breakfast.   No facility-administered  encounter medications on file as of 10/22/2016.     Allergies as of 10/22/2016 - Review Complete 10/22/2016  Allergen Reaction Noted  . Anesthetics, amide Other (See Comments) 02/20/2016  . Lisinopril Cough 10/05/2009    Past Medical History:  Diagnosis Date  . Abdominal pain, unspecified site   . ADVERSE REACTION TO MEDICATION 07/31/2009  . ANEMIA-NOS 07/07/2007  . Arthritis   . Chest pain, unspecified   . Chronic kidney disease    hx of kidney stone  . COLONIC POLYPS, ADENOMATOUS, HX OF   . Disturbance of skin sensation   . DIVERTICULOSIS, COLON 02/23/2009  . Family history of diabetes mellitus   . Family history of malignant neoplasm of breast   . FATIGUE 05/25/2009  . Fever, unspecified   . Heart murmur    hx of  . HYPERLIPIDEMIA 07/07/2007  . HYPERPARATHYROIDISM UNSPECIFIED 10/20/2007  . HYPERTENSION 02/04/2008  . NEPHROLITHIASIS, HX OF 11/15/2008  . OSTEOPOROSIS 01/01/2008  . Other acquired absence of organ    parathyroidectomy  . Pain in limb   . PALPITATIONS, OCCASIONAL 07/12/2008  . PARATHYROIDECTOMY 01/02/2009  . SCOLIOSIS 05/25/2009  . Seasonal allergies   . Unspecified constipation   . URINARY INCONTINENCE 07/07/2007  . UTI'S, RECURRENT 11/20/2009   kimbrough     Past Surgical History:  Procedure Laterality Date  . BUNIONECTOMY    . COLONOSCOPY    . LAPAROSCOPIC APPENDECTOMY  01/19/2016   Procedure: APPENDECTOMY LAPAROSCOPIC with orifice of cecal polyp;  Surgeon: Leighton Ruff, MD;  Location: [facility];  Service: General;;  . [place] open neck exploration  . POLYPECTOMY    . RIGHT OOPHORECTOMY    . TONSILLECTOMY      Family History  Problem Relation Age of Onset  . Sudden death Father 32  . Heart disease Father 43    MI  . Heart disease Mother     valve replacement  . Breast cancer Other     1st degree relative <50  . Diabetes      1st degree relative  . Colon cancer Neg Hx   . Stomach cancer Neg Hx     Social History   Social  History  . Marital status: Married    Spouse name: N/A  . Number of children: 3  . Years of education: N/A   Occupational History  . travel agent Retired   Social History Main Topics  . Smoking status: Never Smoker  . Smokeless tobacco: Never Used  . Alcohol use 3.0 oz/week    5 Glasses of wine per week     Comment: occ. wine  . Drug use: No  . Sexual activity: No   Other Topics Concern  . Not on file   Social History Narrative   HH of 5    Kids at home now   Married   travel agent.    2-3 c coffee in am    ocass wine    G3P2vaginal delivery   Pt cell [phone]               Review of systems: Review of Systems  Constitutional: Negative for fever and chills.  HENT: Positive for sinus problems   Eyes: Negative for blurred vision.  Respiratory: Negative for cough, shortness of breath and wheezing.   Cardiovascular: Negative for chest pain and palpitations.  Gastrointestinal: as per HPI Genitourinary: Negative for dysuria, urgency, frequency and hematuria.  Musculoskeletal: Negative for myalgias, back pain and joint pain.  Skin: Negative for itching and rash.  Neurological: Negative for dizziness, tremors, focal weakness, seizures and loss of consciousness.  Endo/Heme/Allergies: Negative for seasonal allergies.  Psychiatric/Behavioral: Negative for depression, suicidal ideas and hallucinations.  All other systems reviewed and are negative.   Physical Exam: Vitals:   10/22/16 1347  BP: (!) 148/78  Pulse: (!) 50   Body mass index is 22.86 kg/m. Gen:      No acute distress HEENT:  EOMI, sclera anicteric Neck:     No masses; no thyromegaly Lungs:    Clear to auscultation bilaterally; normal respiratory effort CV:         Regular rate and rhythm; no murmurs Abd:      + bowel sounds; soft, non-tender; no palpable masses, no distension Ext:    No edema; adequate peripheral perfusion Skin:      Warm and dry; no rash Neuro: alert and oriented x 3 Psych: normal  mood and affect  Data Reviewed:  Reviewed labs, radiology imaging, old records and pertinent past GI work up   Assessment and Plan/Recommendations: 78 yr F with h/o chronic constipation and fecal incontinence. Patient has IBS predominant constipation and also pelvic floor dysfunction Patient has intermittent fecal incontince with weak anal sphincter, had 1 session of pelvic floor PT and then discontinued when she had surgery with limited cecectomy. She is reluctant to undergo additional sessions of pelvic floor PT She tried benefiber, Linzess and Miralax with no significant improvement Currently having daily bowel movement with peaches Excessive bloating and gas: phazyme 1 capsule Q8h as needed Due  for surveillance colonoscopy, patient said she will call to schedule it Return as needed  15 minutes was spent face-to-face with the patient. Greater than 50% of the time used for counseling as well as treatment plan and follow-up. She had multiple questions which were answered to her satisfaction  K. Denzil Magnuson , MD [phone] Mon-Fri 8a-5p [phone] after 5p, weekends, holidays  CC: Schoenhoff, Altamese Cabal, *

## 2016-10-25 ENCOUNTER — Ambulatory Visit: Payer: Medicare Other | Admitting: Physician Assistant

## 2016-11-19 ENCOUNTER — Ambulatory Visit (INDEPENDENT_AMBULATORY_CARE_PROVIDER_SITE_OTHER): Payer: Medicare Other | Admitting: Cardiology

## 2016-11-19 ENCOUNTER — Encounter: Payer: Self-pay | Admitting: Cardiology

## 2016-11-19 VITALS — BP 160/80 | HR 64 | Ht 61.0 in | Wt 120.0 lb

## 2016-11-19 DIAGNOSIS — R001 Bradycardia, unspecified: Secondary | ICD-10-CM

## 2016-11-19 DIAGNOSIS — R0609 Other forms of dyspnea: Secondary | ICD-10-CM

## 2016-11-19 DIAGNOSIS — R42 Dizziness and giddiness: Secondary | ICD-10-CM

## 2016-11-19 DIAGNOSIS — R0600 Dyspnea, unspecified: Secondary | ICD-10-CM

## 2016-11-19 NOTE — Progress Notes (Signed)
11/19/2016 [name]   1941/01/05  [id number]  Primary Physician Kelton Pillar, MD Primary Cardiologist: Dr. Aundra Dubin    Reason for Visit/CC: DOE, Abnormal Chest CT  HPI:  The patient is a 76 year old female with history of labile hypertension, occasional orthostatic type symptoms, hyperlipidemia who underwent colonoscopy for constipation which discovered a single polyp at the appendiceal orifice. Biopsy showed adenoma with high-grade dysplasia. There was concern for possible invasive disease. Laparoscopic appendectomy was performed on 01/19/16.  Postoperatively, she felt nauseated with poor intake and mild confusion then developed atrial fibrillation with rapid ventricular response. There was no prior h/o afib. Cardiology was consulted and she was placed on IV dilt and her rate improved ans she converted to NSR in less than 24 hrs. Her CHA2DS2 VASc score was calculated at 4 for age >33, HTN and female sex. However, given recent operation and risk for bleeding and afib duration of < 24 hrs anticoagulation was held. 2D echo was ordered which showed a normal EF of 55-60%, normal wall motion with no significant vale abnormalities. Once stabilized and ready for discharge, decision was made to not discharge on oral anticoagulation. It was decided to monitor with a cardiac monitor post discharge to assess for recurrent afib, prior to determining need for anticoagulation.   The patient's monitoring period was 01/23/2016 - 02/12/2016. Final report showed Sinus Rhythm w/PACs with an average heart rate of 63.4 bpm. There were 0 critical, 0 serious, and 6 stable events that occurred. She denies any symptoms. At her last OV, she denied any recurrent palpations. She cleared by her surgeon and given Ok to take ASA daily.   She presents back to clinic today with a new complaint of exertional dyspnea for the past 3-4 months. She had a CT scan on 10/15/16 that showed no infiltrate, masses or nodules. She was  however noted to have dense calcifications in the LAD and scattered calcifications in the RCA. Per office note from her PCP at [facility], she was noted to have an abnormal EKG as well that showed bradycardia with a rate in the upper 40s. Unfortunately, this EKG is currently not available.   Office EKG today shows NSR with HR of 65 bpm. No ischemic abnormalities. She is currently asymptomatic at rest but notes exertional dyspnea walking up stairs and hills. She also gets dizzy and lightheaded. She denies CP and syncope. She is not on any AV nodal blocking agents.     Current Meds  Medication Sig  . aspirin 81 MG tablet Take 81 mg by mouth daily.    [name] atorvastatin (LIPITOR) 20 MG tablet TAKE 1 TABLET EACH DAY.  . cholecalciferol (VITAMIN D) 1000 UNITS tablet Take 1,000 Units by mouth daily.  . diphenhydramine-acetaminophen (TYLENOL PM) 25-500 MG TABS Take 0.5 tablets by mouth at bedtime as needed (For sleep.).   [name] ibuprofen (ADVIL,MOTRIN) 200 MG tablet Take 200 mg by mouth every 8 (eight) hours as needed for headache or mild pain.  [name] losartan (COZAAR) 25 MG tablet Take 1 tablet (25 mg total) by mouth daily.  [name] oxybutynin (DITROPAN-XL) 5 MG 24 hr tablet Take 1 tablet by mouth once a week.  . polyethylene glycol powder (GLYCOLAX/MIRALAX) powder Take 0.5 Containers by mouth as needed.    Allergies  Allergen Reactions  . Anesthetics, Amide Other (See Comments)    Pt states she had hallucinations and HTN after procedure 01/19/16-she feels may have been reaction to anesthetic  . Lisinopril Cough   Past  Medical History:  Diagnosis Date  . Abdominal pain, unspecified site   . ADVERSE REACTION TO MEDICATION 07/31/2009  . ANEMIA-NOS 07/07/2007  . Arthritis   . Chest pain, unspecified   . Chronic kidney disease    hx of kidney stone  . COLONIC POLYPS, ADENOMATOUS, HX OF   . Disturbance of skin sensation   . DIVERTICULOSIS, COLON 02/23/2009  . Family history of diabetes mellitus   .  Family history of malignant neoplasm of breast   . FATIGUE 05/25/2009  . Fever, unspecified   . Heart murmur    hx of  . HYPERLIPIDEMIA 07/07/2007  . HYPERPARATHYROIDISM UNSPECIFIED 10/20/2007  . HYPERTENSION 02/04/2008  . NEPHROLITHIASIS, HX OF 11/15/2008  . OSTEOPOROSIS 01/01/2008  . Other acquired absence of organ    parathyroidectomy  . Pain in limb   . PALPITATIONS, OCCASIONAL 07/12/2008  . PARATHYROIDECTOMY 01/02/2009  . SCOLIOSIS 05/25/2009  . Seasonal allergies   . Unspecified constipation   . URINARY INCONTINENCE 07/07/2007  . UTI'S, RECURRENT 11/20/2009   kimbrough    Family History  Problem Relation Age of Onset  . Sudden death Father 97  . Heart disease Father 14    MI  . Heart disease Mother     valve replacement  . Breast cancer Other     1st degree relative <50  . Diabetes      1st degree relative  . Colon cancer Neg Hx   . Stomach cancer Neg Hx    Past Surgical History:  Procedure Laterality Date  . BUNIONECTOMY    . COLONOSCOPY    . LAPAROSCOPIC APPENDECTOMY  01/19/2016   Procedure: APPENDECTOMY LAPAROSCOPIC with orifice of cecal polyp;  Surgeon: Leighton Ruff, MD;  Location: [facility];  Service: General;;  . [place] open neck exploration  . POLYPECTOMY    . RIGHT OOPHORECTOMY    . TONSILLECTOMY     Social History   Social History  . Marital status: Married    Spouse name: N/A  . Number of children: 3  . Years of education: N/A   Occupational History  . travel agent Retired   Social History Main Topics  . Smoking status: Never Smoker  . Smokeless tobacco: Never Used  . Alcohol use 3.0 oz/week    5 Glasses of wine per week     Comment: occ. wine  . Drug use: No  . Sexual activity: No   Other Topics Concern  . Not on file   Social History Narrative   HH of 5    Kids at home now   Married   travel agent.    2-3 c coffee in am    ocass wine    G3P2vaginal delivery   Pt cell [phone]              Review of  Systems: General: negative for chills, fever, night sweats or weight changes.  Cardiovascular: negative for chest pain, dyspnea on exertion, edema, orthopnea, palpitations, paroxysmal nocturnal dyspnea or shortness of breath Dermatological: negative for rash Respiratory: negative for cough or wheezing Urologic: negative for hematuria Abdominal: negative for nausea, vomiting, diarrhea, bright red blood per rectum, melena, or hematemesis Neurologic: negative for visual changes, syncope, or dizziness All other systems reviewed and are otherwise negative except as noted above.   Physical Exam:  Blood pressure (!) 160/80, pulse 64, height 5\' 1"  (1.549 m), weight 120 lb (54.4 kg).  General appearance: alert, cooperative and no distress  Neck: no carotid bruit and no JVD Lungs: clear to auscultation bilaterally Heart: regular rate and rhythm, S1, S2 normal, no murmur, click, rub or gallop Extremities: extremities normal, atraumatic, no cyanosis or edema Pulses: 2+ and symmetric Skin: Skin color, texture, turgor normal. No rashes or lesions Neurologic: Grossly normal  EKG NSR. HR 65 bpm.   ASSESSMENT AND PLAN:   1. Exertional Dyspnea: paroxsymal and accoupained by dizziness, fatigue and lightheartedness. Concerns of bradycardia, per PCP records. Also with findings of coronary calcifications on recent chest CT. 2D echo 01/2016 showed normal LVEF and normal wall motion with mild MR. Volume appears stable on exam today. EKG today shows NSR with HR of 65 bpm. She does have a h/o post operative atrial fibrillation. She is not on any AV nodal blocking agents. We will re-evaluate with a heart monitor for 30 days to r/o conduction abnormalities (bradycardia/ recurrent afib/ SSS). We will also assess with an exercise NST to r/o coronary ischemia, given incidental findings of coronary calcifications on exam.   2. Hypertension: Patient has a history of labile systolic pressures ranging from the 110s-140s. She  develops orthostatic like symptoms with lower systolic pressures. She feels best when her blood pressures are higher. She is only on one antihypertensive, low-dose losartan. She was previously on 50 mg but did not tolerate dose and now currently on 25 mg. She is resistant to increasing dose back up.   3. Hyperlipidemia: On low-dose Lipitor. Lipid panel is followed by her PCP. Last lipid panel demonstrated satisfactory levels with LDL at goal below 100.    PLAN  F/u after 30 day monitor and stress test.   Lyda Jester PA-C 11/19/2016 4:12 PM

## 2016-11-19 NOTE — Patient Instructions (Signed)
Medication Instructions:  Your physician recommends that you continue on your current medications as directed. Please refer to the Current Medication list given to you today.   Labwork: NONE  Testing/Procedures: 1. Your physician has requested that you have en exercise stress myoview. For further information please visit [email or web address]. Please follow instruction sheet, as given.  2. Your physician has recommended that you wear an event monitor. Event monitors are medical devices that record the heart's electrical activity. Doctors most often [place] these monitors to diagnose arrhythmias. Arrhythmias are problems with the speed or rhythm of the heartbeat. The monitor is a small, portable device. You can wear one while you do your normal daily activities. This is usually used to diagnose what is causing palpitations/syncope (passing out).   Follow-Up: 5-6 WEEKS WITH Ellen Henri, PAC   Any Other Special Instructions Will Be Listed Below (If Applicable).     If you need a refill on your cardiac medications before your next appointment, please call your pharmacy.

## 2016-11-20 NOTE — Progress Notes (Signed)
Dr. Saunders Revel would be fine.

## 2016-11-21 ENCOUNTER — Encounter: Payer: Self-pay | Admitting: *Deleted

## 2016-11-21 ENCOUNTER — Telehealth: Payer: Self-pay | Admitting: Cardiology

## 2016-11-21 NOTE — Progress Notes (Signed)
Patient ID: [name], female   DOB: [birth year], 76 y.o.   MRN: [id number] Patient did not show up for 11/21/16, 10:30 AM, appointment to have a cardiac event monitor applied.

## 2016-11-21 NOTE — Telephone Encounter (Signed)
Patient cancelled event monitor and stress test, pt cx,did not want to wear monitor or have stress test at this time-[name])

## 2016-11-26 ENCOUNTER — Encounter (HOSPITAL_COMMUNITY): Payer: Medicare Other

## 2017-01-08 ENCOUNTER — Ambulatory Visit: Payer: Medicare Other | Admitting: Cardiology

## 2017-01-14 DIAGNOSIS — M810 Age-related osteoporosis without current pathological fracture: Secondary | ICD-10-CM | POA: Diagnosis not present

## 2017-01-31 DIAGNOSIS — H40013 Open angle with borderline findings, low risk, bilateral: Secondary | ICD-10-CM | POA: Diagnosis not present

## 2017-01-31 DIAGNOSIS — H532 Diplopia: Secondary | ICD-10-CM | POA: Diagnosis not present

## 2017-02-06 ENCOUNTER — Emergency Department (HOSPITAL_COMMUNITY)
Admission: EM | Admit: 2017-02-06 | Discharge: 2017-02-06 | Disposition: A | Discharge status: Home / Self Care | Payer: Medicare Other | Source: Home / Self Care | Attending: Emergency Medicine | Admitting: Emergency Medicine

## 2017-02-06 ENCOUNTER — Encounter (HOSPITAL_COMMUNITY): Payer: Self-pay | Admitting: Emergency Medicine

## 2017-02-06 ENCOUNTER — Emergency Department (HOSPITAL_COMMUNITY): Payer: Medicare Other

## 2017-02-06 DIAGNOSIS — R42 Dizziness and giddiness: Secondary | ICD-10-CM | POA: Diagnosis not present

## 2017-02-06 DIAGNOSIS — Y9389 Activity, other specified: Secondary | ICD-10-CM

## 2017-02-06 DIAGNOSIS — W1830XA Fall on same level, unspecified, initial encounter: Secondary | ICD-10-CM | POA: Insufficient documentation

## 2017-02-06 DIAGNOSIS — S22088A Other fracture of T11-T12 vertebra, initial encounter for closed fracture: Secondary | ICD-10-CM | POA: Insufficient documentation

## 2017-02-06 DIAGNOSIS — N189 Chronic kidney disease, unspecified: Secondary | ICD-10-CM

## 2017-02-06 DIAGNOSIS — Y999 Unspecified external cause status: Secondary | ICD-10-CM

## 2017-02-06 DIAGNOSIS — S22000A Wedge compression fracture of unspecified thoracic vertebra, initial encounter for closed fracture: Secondary | ICD-10-CM

## 2017-02-06 DIAGNOSIS — S3991XA Unspecified injury of abdomen, initial encounter: Secondary | ICD-10-CM | POA: Diagnosis not present

## 2017-02-06 DIAGNOSIS — Z7982 Long term (current) use of aspirin: Secondary | ICD-10-CM | POA: Insufficient documentation

## 2017-02-06 DIAGNOSIS — S22080A Wedge compression fracture of T11-T12 vertebra, initial encounter for closed fracture: Secondary | ICD-10-CM | POA: Diagnosis not present

## 2017-02-06 DIAGNOSIS — I129 Hypertensive chronic kidney disease with stage 1 through stage 4 chronic kidney disease, or unspecified chronic kidney disease: Secondary | ICD-10-CM | POA: Insufficient documentation

## 2017-02-06 DIAGNOSIS — Y92000 Kitchen of unspecified non-institutional (private) residence as  the place of occurrence of the external cause: Secondary | ICD-10-CM

## 2017-02-06 DIAGNOSIS — S22089A Unspecified fracture of T11-T12 vertebra, initial encounter for closed fracture: Secondary | ICD-10-CM | POA: Diagnosis not present

## 2017-02-06 DIAGNOSIS — R109 Unspecified abdominal pain: Secondary | ICD-10-CM | POA: Diagnosis not present

## 2017-02-06 DIAGNOSIS — T148XXA Other injury of unspecified body region, initial encounter: Secondary | ICD-10-CM | POA: Diagnosis not present

## 2017-02-06 DIAGNOSIS — M546 Pain in thoracic spine: Secondary | ICD-10-CM | POA: Diagnosis not present

## 2017-02-06 DIAGNOSIS — M545 Low back pain: Secondary | ICD-10-CM | POA: Diagnosis not present

## 2017-02-06 DIAGNOSIS — M5489 Other dorsalgia: Secondary | ICD-10-CM | POA: Diagnosis not present

## 2017-02-06 DIAGNOSIS — Z79899 Other long term (current) drug therapy: Secondary | ICD-10-CM

## 2017-02-06 MED ORDER — KETOROLAC TROMETHAMINE 30 MG/ML IJ SOLN
15.0000 mg | Freq: Once | INTRAMUSCULAR | Status: AC
  Administered 2017-02-06: 15 mg via INTRAVENOUS
  Filled 2017-02-06: qty 1

## 2017-02-06 MED ORDER — FENTANYL CITRATE (PF) 100 MCG/2ML IJ SOLN
50.0000 ug | Freq: Once | INTRAMUSCULAR | Status: DC
  Filled 2017-02-06: qty 2

## 2017-02-06 MED ORDER — ONDANSETRON HCL 4 MG/2ML IJ SOLN
4.0000 mg | Freq: Once | INTRAMUSCULAR | Status: AC
  Administered 2017-02-06: 4 mg via INTRAVENOUS
  Filled 2017-02-06: qty 2

## 2017-02-06 MED ORDER — FENTANYL CITRATE (PF) 100 MCG/2ML IJ SOLN
50.0000 ug | Freq: Once | INTRAMUSCULAR | Status: AC
  Administered 2017-02-06: 50 ug via INTRAVENOUS

## 2017-02-06 MED ORDER — HYDROCODONE-ACETAMINOPHEN 5-325 MG PO TABS: 1.0000 | ORAL_TABLET | ORAL | 0 refills | Status: DC | PRN

## 2017-02-06 NOTE — ED Notes (Signed)
Bed: IW80 Expected date:  Expected time:  Means of arrival:  Comments: EMS-fall/HTN

## 2017-02-06 NOTE — ED Triage Notes (Signed)
Per [facility] pt from home , fall today, no syncope nor dizziness , no LOC per EMS per Pt. Pt was hypertensive per EMS. Hx hyperlipidemia. Alert and oriented x 4. No weakness .

## 2017-02-06 NOTE — ED Notes (Signed)
Pt reports lower back pain post fall.

## 2017-02-06 NOTE — ED Notes (Signed)
Pt reports HX htn, took her BP meds today yet BP is reading high today.

## 2017-02-06 NOTE — Discharge Instructions (Signed)
You have a 30% fracture of T11.  Use a stool softener such as Colace twice a day to prevent constipation.  Take Tylenol 650 mg every 4 hours regularly, for pain,  except when using the narcotic pain reliever.  Use the narcotic pain reliever, if needed.

## 2017-02-06 NOTE — ED Provider Notes (Signed)
[facility] DEPT Provider Note   CSN: [id number] Arrival date & time: 02/06/17  1334     History   Chief Complaint Chief Complaint  Patient presents with  . Fall    HPI [name] is a 76 y.o. female.  She presents by EMS for evaluation of low back pain, following a fall.  She was working in [employer], turned suddenly and collapsed backwards.  She feels like she fell because of "clumsiness".  She denies injury to her head arms or legs.  No prior problems with the back.  She denies recent illnesses including fever, chills, nausea, vomiting, headache, weakness or dizziness.  There are no other known modifying factors.  HPI  Past Medical History:  Diagnosis Date  . Abdominal pain, unspecified site   . ADVERSE REACTION TO MEDICATION 07/31/2009  . ANEMIA-NOS 07/07/2007  . Arthritis   . Chest pain, unspecified   . Chronic kidney disease    hx of kidney stone  . COLONIC POLYPS, ADENOMATOUS, HX OF   . Disturbance of skin sensation   . DIVERTICULOSIS, COLON 02/23/2009  . Family history of diabetes mellitus   . Family history of malignant neoplasm of breast   . FATIGUE 05/25/2009  . Fever, unspecified   . Heart murmur    hx of  . HYPERLIPIDEMIA 07/07/2007  . HYPERPARATHYROIDISM UNSPECIFIED 10/20/2007  . HYPERTENSION 02/04/2008  . NEPHROLITHIASIS, HX OF 11/15/2008  . OSTEOPOROSIS 01/01/2008  . Other acquired absence of organ    parathyroidectomy  . Pain in limb   . PALPITATIONS, OCCASIONAL 07/12/2008  . PARATHYROIDECTOMY 01/02/2009  . SCOLIOSIS 05/25/2009  . Seasonal allergies   . Unspecified constipation   . URINARY INCONTINENCE 07/07/2007  . UTI'S, RECURRENT 11/20/2009   kimbrough     Patient Active Problem List   Diagnosis Date Noted  . Atrial fibrillation with rapid ventricular response ([place]) 01/21/2016  . Colon polyp s/p appy/partialcecetomy 01/19/3016 01/19/2016  . Dizziness 04/25/2014  . Vertigo 12/26/2012  . Hypertension, uncontrolled 09/11/2012  . Diastolic  dysfunction 48/18/5631  . Sleep disturbance 09/17/2011  . Hypotension 12/26/2010  . Hypotension, postural 12/26/2010  . UTI'S, RECURRENT 11/20/2009  . ADVERSE REACTION TO MEDICATION 07/31/2009  . SCOLIOSIS 05/25/2009  . FATIGUE 05/25/2009  . DIVERTICULOSIS, COLON 02/23/2009  . COLONIC POLYPS, ADENOMATOUS, HX OF 02/23/2009  . PARATHYROIDECTOMY 01/02/2009  . NEPHROLITHIASIS, HX OF 11/15/2008  . PALPITATIONS, OCCASIONAL 07/12/2008  . Essential hypertension 02/04/2008  . CONSTIPATION, CHRONIC 01/01/2008  . LEG PAIN, LEFT 01/01/2008  . OSTEOPOROSIS 01/01/2008  . HYPERPARATHYROIDISM UNSPECIFIED 10/20/2007  . HYPERLIPIDEMIA 07/07/2007  . ANEMIA-NOS 07/07/2007  . URINARY INCONTINENCE 07/07/2007    Past Surgical History:  Procedure Laterality Date  . BUNIONECTOMY    . COLONOSCOPY    . LAPAROSCOPIC APPENDECTOMY  01/19/2016   Procedure: APPENDECTOMY LAPAROSCOPIC with orifice of cecal polyp;  Surgeon: Leighton Ruff, MD;  Location: [facility]S;  Service: General;;  . [place] open neck exploration  . POLYPECTOMY    . RIGHT OOPHORECTOMY    . TONSILLECTOMY      OB History    Gravida Para Term Preterm AB Living   3         3   SAB TAB Ectopic Multiple Live Births                   Home Medications    Prior to Admission medications   Medication Sig Start Date End Date Taking? Authorizing Provider  aspirin  81 MG tablet Take 81 mg by mouth daily.      Historical Provider, MD  atorvastatin (LIPITOR) 20 MG tablet TAKE 1 TABLET EACH DAY. 02/19/16   Brittainy Erie Noe, PA-C  cholecalciferol (VITAMIN D) 1000 UNITS tablet Take 1,000 Units by mouth daily.    Historical Provider, MD  diphenhydramine-acetaminophen (TYLENOL PM) 25-500 MG TABS Take 0.5 tablets by mouth at bedtime as needed (For sleep.).     Historical Provider, MD  HYDROcodone-acetaminophen (NORCO/VICODIN) 5-325 MG tablet Take 1 tablet by mouth every 4 (four) hours as needed for moderate pain. 02/06/17    Daleen Bo, MD  ibuprofen (ADVIL,MOTRIN) 200 MG tablet Take 200 mg by mouth every 8 (eight) hours as needed for headache or mild pain.    Historical Provider, MD  losartan (COZAAR) 25 MG tablet Take 1 tablet (25 mg total) by mouth daily. 02/19/16   Brittainy Erie Noe, PA-C  oxybutynin (DITROPAN-XL) 5 MG 24 hr tablet Take 1 tablet by mouth once a week. 08/18/16   Historical Provider, MD  polyethylene glycol powder (GLYCOLAX/MIRALAX) powder Take 0.5 Containers by mouth as needed.  09/23/16   Historical Provider, MD    Family History Family History  Problem Relation Age of Onset  . Sudden death Father 10  . Heart disease Father 40    MI  . Heart disease Mother     valve replacement  . Breast cancer Other     1st degree relative <50  . Diabetes      1st degree relative  . Colon cancer Neg Hx   . Stomach cancer Neg Hx     Social History Social History  Substance Use Topics  . Smoking status: Never Smoker  . Smokeless tobacco: Never Used  . Alcohol use 3.0 oz/week    5 Glasses of wine per week     Comment: occ. wine     Allergies   Anesthetics, amide and Lisinopril   Review of Systems Review of Systems  All other systems reviewed and are negative.    Physical Exam Updated Vital Signs BP (!) 231/89 (BP Location: Left Arm)   Pulse 66   Temp 98.1 F (36.7 C) (Oral)   Resp 16   SpO2 100%   Physical Exam  Constitutional: She is oriented to person, place, and time. She appears well-developed and well-nourished. No distress.  HENT:  Head: Normocephalic and atraumatic.  Eyes: Conjunctivae and EOM are normal. Pupils are equal, round, and reactive to light.  Neck: Normal range of motion and phonation normal. Neck supple.  Cardiovascular: Normal rate and regular rhythm.   Pulmonary/Chest: Effort normal and breath sounds normal. She exhibits no tenderness.  Abdominal: Soft. She exhibits no distension. There is no tenderness. There is no guarding.  Musculoskeletal: Normal  range of motion. She exhibits no deformity.  Normal range of motion arms and legs bilaterally.  No tenderness of the cervical thoracic or lumbar spine.  She localizes her pain in the mid lumbar region.  Neurological: She is alert and oriented to person, place, and time. She exhibits normal muscle tone.  Skin: Skin is warm and dry.  Psychiatric: She has a normal mood and affect. Her behavior is normal. Judgment and thought content normal.  Nursing note and vitals reviewed.    ED Treatments / Results  Labs (all labs ordered are listed, but only abnormal results are displayed) Labs Reviewed - No data to display  EKG  EKG Interpretation None       Radiology  Dg Thoracic Spine 2 View  Result Date: 02/06/2017 CLINICAL DATA:  [place] today.  Back pain. EXAM: THORACIC SPINE 2 VIEWS COMPARISON:  Chest CT 10/13/2016 FINDINGS: Thoracolumbar scoliosis and degenerative changes in the thoracic spine. There is a compression deformity of the T11 vertebral body not present on the prior CT scan, likely an acute compression fracture. There is a remote compression deformity of T8. IMPRESSION: Suspect acute compression fracture of T11. Electronically Signed   By: Marijo Sanes M.D.   On: 02/06/2017 16:28   Dg Lumbar Spine Complete  Result Date: 02/06/2017 CLINICAL DATA:  Recent fall with low back pain, initial encounter EXAM: LUMBAR SPINE - COMPLETE 4+ VIEW COMPARISON:  None. FINDINGS: Scoliosis concave to the left is noted. No pars defects are seen. No spondylolisthesis is noted. No compression deformities are noted. Very mild osteophytic changes are seen. Postsurgical changes in the pelvis are noted. IMPRESSION: No acute abnormality noted.  Mild degenerative change is seen. Electronically Signed   By: Inez Catalina M.D.   On: 02/06/2017 15:57    Procedures Procedures (including critical care time)  Medications Ordered in ED Medications  ondansetron (ZOFRAN) injection 4 mg (4 mg Intravenous Given 02/06/17  1516)  fentaNYL (SUBLIMAZE) injection 50 mcg (50 mcg Intravenous Given 02/06/17 1516)  ketorolac (TORADOL) 30 MG/ML injection 15 mg (15 mg Intravenous Given 02/06/17 1633)     Initial Impression / Assessment and Plan / ED Course  I have reviewed the triage vital signs and the nursing notes.  Pertinent labs & imaging results that were available during my care of the patient were reviewed by me and considered in my medical decision making (see chart for details).  Clinical Course as of Feb 07 1652  Thu Feb 06, 2017  1623 DG Thoracic Spine 2 View [EW]    Clinical Course User Index [EW] Daleen Bo, MD    Medications  ondansetron [facility]) injection 4 mg (4 mg Intravenous Given 02/06/17 1516)  fentaNYL (SUBLIMAZE) injection 50 mcg (50 mcg Intravenous Given 02/06/17 1516)  ketorolac (TORADOL) 30 MG/ML injection 15 mg (15 mg Intravenous Given 02/06/17 1633)    Patient Vitals for the past 24 hrs:  BP Temp Temp src Pulse Resp SpO2  02/06/17 1345 (!) 231/89 98.1 F (36.7 C) Oral 66 16 100 %  02/06/17 1336 - - - - - 98 %    4:53 PM Reevaluation with update and discussion. After initial assessment and treatment, an updated evaluation reveals pain now improved after Toradol.  Findings discussed with patient and family members, questions answered. Lindey Renzulli L    Final Clinical Impressions(s) / ED Diagnoses   Final diagnoses:  Closed compression fracture of thoracic vertebra, initial encounter ([place])   Fall with isolated T11 compression fracture injury.  Doubt visceral injury.  Doubt head injury.  Nursing Notes Reviewed/ Care Coordinated Applicable Imaging Reviewed Interpretation of Laboratory Data incorporated into ED treatment  The patient appears reasonably screened and/or stabilized for discharge and I doubt any other medical condition or other [facility] requiring further screening, evaluation, or treatment in the ED at this time prior to discharge.  Plan: Home Medications-continue  usual, Colace twice daily while on narcotics; Home Treatments-rest; return here if the recommended treatment, does not improve the symptoms; Recommended follow up-PCP as needed.  Orthopedics, 1 week.  Consider kyphoplasty.    New Prescriptions New Prescriptions   HYDROCODONE-ACETAMINOPHEN (NORCO/VICODIN) 5-325 MG TABLET    Take 1 tablet by mouth every 4 (four) hours as needed for moderate pain.  Daleen Bo, MD 02/06/17 [id number]

## 2017-02-07 ENCOUNTER — Encounter (HOSPITAL_COMMUNITY): Payer: Self-pay

## 2017-02-07 ENCOUNTER — Inpatient Hospital Stay (HOSPITAL_COMMUNITY)
Admission: EM | Admit: 2017-02-07 | Discharge: 2017-02-09 | DRG: 552 | Disposition: A | Discharge status: Home Health | Payer: Medicare Other | Attending: Internal Medicine | Admitting: Internal Medicine

## 2017-02-07 ENCOUNTER — Emergency Department (HOSPITAL_COMMUNITY): Payer: Medicare Other

## 2017-02-07 DIAGNOSIS — R110 Nausea: Secondary | ICD-10-CM | POA: Diagnosis present

## 2017-02-07 DIAGNOSIS — T40605A Adverse effect of unspecified narcotics, initial encounter: Secondary | ICD-10-CM | POA: Diagnosis present

## 2017-02-07 DIAGNOSIS — R112 Nausea with vomiting, unspecified: Secondary | ICD-10-CM | POA: Diagnosis not present

## 2017-02-07 DIAGNOSIS — Z9181 History of falling: Secondary | ICD-10-CM

## 2017-02-07 DIAGNOSIS — K5900 Constipation, unspecified: Secondary | ICD-10-CM | POA: Diagnosis present

## 2017-02-07 DIAGNOSIS — I129 Hypertensive chronic kidney disease with stage 1 through stage 4 chronic kidney disease, or unspecified chronic kidney disease: Secondary | ICD-10-CM | POA: Diagnosis present

## 2017-02-07 DIAGNOSIS — S22000A Wedge compression fracture of unspecified thoracic vertebra, initial encounter for closed fracture: Secondary | ICD-10-CM | POA: Diagnosis present

## 2017-02-07 DIAGNOSIS — N189 Chronic kidney disease, unspecified: Secondary | ICD-10-CM | POA: Diagnosis present

## 2017-02-07 DIAGNOSIS — M1990 Unspecified osteoarthritis, unspecified site: Secondary | ICD-10-CM | POA: Diagnosis present

## 2017-02-07 DIAGNOSIS — M5489 Other dorsalgia: Secondary | ICD-10-CM | POA: Diagnosis not present

## 2017-02-07 DIAGNOSIS — Z7982 Long term (current) use of aspirin: Secondary | ICD-10-CM

## 2017-02-07 DIAGNOSIS — I10 Essential (primary) hypertension: Secondary | ICD-10-CM | POA: Diagnosis present

## 2017-02-07 DIAGNOSIS — R030 Elevated blood-pressure reading, without diagnosis of hypertension: Secondary | ICD-10-CM | POA: Diagnosis not present

## 2017-02-07 DIAGNOSIS — E213 Hyperparathyroidism, unspecified: Secondary | ICD-10-CM | POA: Diagnosis present

## 2017-02-07 DIAGNOSIS — R4182 Altered mental status, unspecified: Secondary | ICD-10-CM | POA: Diagnosis present

## 2017-02-07 DIAGNOSIS — Z884 Allergy status to anesthetic agent status: Secondary | ICD-10-CM

## 2017-02-07 DIAGNOSIS — M419 Scoliosis, unspecified: Secondary | ICD-10-CM | POA: Diagnosis present

## 2017-02-07 DIAGNOSIS — R2689 Other abnormalities of gait and mobility: Secondary | ICD-10-CM | POA: Diagnosis present

## 2017-02-07 DIAGNOSIS — S22088A Other fracture of T11-T12 vertebra, initial encounter for closed fracture: Principal | ICD-10-CM | POA: Diagnosis present

## 2017-02-07 DIAGNOSIS — E785 Hyperlipidemia, unspecified: Secondary | ICD-10-CM | POA: Diagnosis present

## 2017-02-07 DIAGNOSIS — R42 Dizziness and giddiness: Secondary | ICD-10-CM

## 2017-02-07 DIAGNOSIS — I951 Orthostatic hypotension: Secondary | ICD-10-CM | POA: Diagnosis present

## 2017-02-07 DIAGNOSIS — Z79899 Other long term (current) drug therapy: Secondary | ICD-10-CM

## 2017-02-07 DIAGNOSIS — E860 Dehydration: Secondary | ICD-10-CM | POA: Diagnosis present

## 2017-02-07 DIAGNOSIS — R109 Unspecified abdominal pain: Secondary | ICD-10-CM | POA: Diagnosis not present

## 2017-02-07 DIAGNOSIS — S22080A Wedge compression fracture of T11-T12 vertebra, initial encounter for closed fracture: Secondary | ICD-10-CM

## 2017-02-07 DIAGNOSIS — M810 Age-related osteoporosis without current pathological fracture: Secondary | ICD-10-CM | POA: Diagnosis present

## 2017-02-07 DIAGNOSIS — S3991XA Unspecified injury of abdomen, initial encounter: Secondary | ICD-10-CM | POA: Diagnosis not present

## 2017-02-07 DIAGNOSIS — W19XXXA Unspecified fall, initial encounter: Secondary | ICD-10-CM | POA: Diagnosis present

## 2017-02-07 DIAGNOSIS — Z888 Allergy status to other drugs, medicaments and biological substances status: Secondary | ICD-10-CM

## 2017-02-07 LAB — COMPREHENSIVE METABOLIC PANEL
ALT: 15 U/L (ref 14–54)
AST: 28 U/L (ref 15–41)
Albumin: 4.2 g/dL (ref 3.5–5.0)
Alkaline Phosphatase: 57 U/L (ref 38–126)
Anion gap: 10 (ref 5–15)
BUN: 11 mg/dL (ref 6–20)
CO2: 21 mmol/L — ABNORMAL LOW (ref 22–32)
Calcium: 8.4 mg/dL — ABNORMAL LOW (ref 8.9–10.3)
Chloride: 102 mmol/L (ref 101–111)
Creatinine, Ser: 0.43 mg/dL — ABNORMAL LOW (ref 0.44–1.00)
GFR calc Af Amer: >60 mL/min (ref >60)
GFR calc non Af Amer: >60 mL/min (ref >60)
Glucose, Bld: 121 mg/dL — ABNORMAL HIGH (ref 65–99)
Potassium: 4 mmol/L (ref 3.5–5.1)
Sodium: 133 mmol/L — ABNORMAL LOW (ref 135–145)
Total Bilirubin: 2.8 mg/dL — ABNORMAL HIGH (ref 0.3–1.2)
Total Protein: 7 g/dL (ref 6.5–8.1)

## 2017-02-07 LAB — CBC WITH DIFFERENTIAL/PLATELET
Basophils Absolute: 0 10*3/uL (ref 0.0–0.1)
Basophils Relative: 0 %
Eosinophils Absolute: 0 10*3/uL (ref 0.0–0.7)
Eosinophils Relative: 0 %
HCT: 42.2 % (ref 36.0–46.0)
Hemoglobin: 14.7 g/dL (ref 12.0–15.0)
Lymphocytes Relative: 7 %
Lymphs Abs: 0.6 10*3/uL — ABNORMAL LOW (ref 0.7–4.0)
MCH: 32 pg (ref 26.0–34.0)
MCHC: 34.8 g/dL (ref 30.0–36.0)
MCV: 91.7 fL (ref 78.0–100.0)
Monocytes Absolute: 0.4 10*3/uL (ref 0.1–1.0)
Monocytes Relative: 5 %
Neutro Abs: 7.6 10*3/uL (ref 1.7–7.7)
Neutrophils Relative %: 88 %
Platelets: 153 10*3/uL (ref 150–400)
RBC: 4.6 MIL/uL (ref 3.87–5.11)
RDW: 13 % (ref 11.5–15.5)
WBC: 8.7 10*3/uL (ref 4.0–10.5)

## 2017-02-07 LAB — LIPASE, BLOOD: Lipase: 12 U/L (ref 11–51)

## 2017-02-07 MED ORDER — LOSARTAN POTASSIUM 25 MG PO TABS
25.0000 mg | ORAL_TABLET | Freq: Every day | ORAL | Status: DC
  Administered 2017-02-08 – 2017-02-09 (×2): 25 mg via ORAL
  Filled 2017-02-07 (×2): qty 1

## 2017-02-07 MED ORDER — OXYBUTYNIN CHLORIDE ER 5 MG PO TB24: 5.0000 mg | ORAL_TABLET | ORAL | Status: DC

## 2017-02-07 MED ORDER — IOPAMIDOL (ISOVUE-300) INJECTION 61%
INTRAVENOUS | Status: AC
  Administered 2017-02-07: 100 mL
  Filled 2017-02-07: qty 100

## 2017-02-07 MED ORDER — ASPIRIN 81 MG PO CHEW
81.0000 mg | CHEWABLE_TABLET | Freq: Every day | ORAL | Status: DC
  Administered 2017-02-08 – 2017-02-09 (×2): 81 mg via ORAL
  Filled 2017-02-07 (×2): qty 1

## 2017-02-07 MED ORDER — ONDANSETRON HCL 4 MG/2ML IJ SOLN
4.0000 mg | Freq: Four times a day (QID) | INTRAMUSCULAR | Status: DC | PRN
  Administered 2017-02-08: 4 mg via INTRAVENOUS
  Filled 2017-02-07: qty 2

## 2017-02-07 MED ORDER — ATORVASTATIN CALCIUM 20 MG PO TABS
20.0000 mg | ORAL_TABLET | Freq: Every day | ORAL | Status: DC
  Administered 2017-02-08: 20 mg via ORAL
  Filled 2017-02-07: qty 1

## 2017-02-07 MED ORDER — METOCLOPRAMIDE HCL 5 MG/ML IJ SOLN
10.0000 mg | Freq: Once | INTRAMUSCULAR | Status: AC
  Administered 2017-02-07: 10 mg via INTRAVENOUS
  Filled 2017-02-07: qty 2

## 2017-02-07 MED ORDER — MECLIZINE HCL 25 MG PO TABS
50.0000 mg | ORAL_TABLET | Freq: Once | ORAL | Status: AC
  Administered 2017-02-07: 50 mg via ORAL
  Filled 2017-02-07: qty 2

## 2017-02-07 MED ORDER — HYDROCODONE-ACETAMINOPHEN 5-325 MG PO TABS
1.0000 | ORAL_TABLET | ORAL | Status: DC | PRN
  Filled 2017-02-07: qty 1

## 2017-02-07 MED ORDER — ONDANSETRON HCL 4 MG/2ML IJ SOLN
4.0000 mg | Freq: Once | INTRAMUSCULAR | Status: AC
  Administered 2017-02-07: 4 mg via INTRAVENOUS
  Filled 2017-02-07: qty 2

## 2017-02-07 MED ORDER — SODIUM CHLORIDE 0.9 % IV BOLUS (SEPSIS)
1000.0000 mL | Freq: Once | INTRAVENOUS | Status: AC
  Administered 2017-02-07: 1000 mL via INTRAVENOUS

## 2017-02-07 MED ORDER — IOPAMIDOL (ISOVUE-300) INJECTION 61%
INTRAVENOUS | Status: AC
  Filled 2017-02-07: qty 30

## 2017-02-07 MED ORDER — MECLIZINE HCL 25 MG PO TABS
50.0000 mg | ORAL_TABLET | Freq: Three times a day (TID) | ORAL | Status: DC | PRN
  Filled 2017-02-07: qty 2

## 2017-02-07 MED ORDER — POLYETHYLENE GLYCOL 3350 17 GM/SCOOP PO POWD: 0.5000 | Freq: Every day | ORAL | Status: DC | PRN

## 2017-02-07 NOTE — ED Notes (Signed)
Patient transported to CT 

## 2017-02-07 NOTE — ED Triage Notes (Signed)
Per EMS, pt from home.  Pt c/o nausea.  Seen here yesterday for back pain.  Started on pain meds for back fracture.  Pt family was giving her meds every 4 hours.  Slept through last dose and pain uncontrolled.  Pt also constipated.  Pt was prescribed miralax but did not take.  Pt given 100 fent, 4mg  zofran in route.  IV 20g lt hand.  Vitals: 210/104, hr 70, resp 22, 98% ra, cbg 170

## 2017-02-07 NOTE — ED Notes (Signed)
Family at bedside. 

## 2017-02-07 NOTE — ED Notes (Signed)
Pt is still c/o severe nausea and continues to moan and groan.

## 2017-02-07 NOTE — ED Notes (Signed)
Attempted to get pt OOB, at first pt reported that the lightheadedness and nausea is completely gone.  Assisted pt to sitting position, she started to say "i'm scared."  She states she was afraid to walk but did not elaborate.  As soon as pt stood up, she nausea recurred.  Assisted pt to lie down.  [facility] EDP and family members made aware.

## 2017-02-07 NOTE — ED Provider Notes (Addendum)
[facility] DEPT Provider Note   CSN: [id number] Arrival date & time: 02/07/17  1716     History   Chief Complaint Chief Complaint  Patient presents with  . Constipation  . Nausea    HPI [name] is a 76 y.o. female.  HPI 76 year old female who presents with severe nausea and constipation. She was seen in the emergency department one day ago after mechanical fall with back pain. Has x-ray that was concerning for a T11 vertebral body fracture. Pain was controlled and she was sent for outpatient follow-up. Was discharged home with hydrocodone which she has been taking every 4 hours. This morning at around 11:30 with potentially her last dose and since then she has been having severe nausea, lightheadedness, and constipation. Unsure of what her last bowel movement was but it was several days ago. Patient did not take any laxatives. Has not had vomiting, fevers, lower extremity numbness or weakness, urinary complaints. Family states she seems "more out of it" today.    Past Medical History:  Diagnosis Date  . Abdominal pain, unspecified site   . ADVERSE REACTION TO MEDICATION 07/31/2009  . ANEMIA-NOS 07/07/2007  . Arthritis   . Chest pain, unspecified   . Chronic kidney disease    hx of kidney stone  . COLONIC POLYPS, ADENOMATOUS, HX OF   . Disturbance of skin sensation   . DIVERTICULOSIS, COLON 02/23/2009  . Family history of diabetes mellitus   . Family history of malignant neoplasm of breast   . FATIGUE 05/25/2009  . Fever, unspecified   . Heart murmur    hx of  . HYPERLIPIDEMIA 07/07/2007  . HYPERPARATHYROIDISM UNSPECIFIED 10/20/2007  . HYPERTENSION 02/04/2008  . NEPHROLITHIASIS, HX OF 11/15/2008  . OSTEOPOROSIS 01/01/2008  . Other acquired absence of organ    parathyroidectomy  . Pain in limb   . PALPITATIONS, OCCASIONAL 07/12/2008  . PARATHYROIDECTOMY 01/02/2009  . SCOLIOSIS 05/25/2009  . Seasonal allergies   . Unspecified constipation   . URINARY INCONTINENCE  07/07/2007  . UTI'S, RECURRENT 11/20/2009   kimbrough     Patient Active Problem List   Diagnosis Date Noted  . Atrial fibrillation with rapid ventricular response ([place]) 01/21/2016  . Colon polyp s/p appy/partialcecetomy 01/19/3016 01/19/2016  . Dizziness 04/25/2014  . Vertigo 12/26/2012  . Hypertension, uncontrolled 09/11/2012  . Diastolic dysfunction 32/35/5732  . Sleep disturbance 09/17/2011  . Hypotension 12/26/2010  . Hypotension, postural 12/26/2010  . UTI'S, RECURRENT 11/20/2009  . ADVERSE REACTION TO MEDICATION 07/31/2009  . SCOLIOSIS 05/25/2009  . FATIGUE 05/25/2009  . DIVERTICULOSIS, COLON 02/23/2009  . COLONIC POLYPS, ADENOMATOUS, HX OF 02/23/2009  . PARATHYROIDECTOMY 01/02/2009  . NEPHROLITHIASIS, HX OF 11/15/2008  . PALPITATIONS, OCCASIONAL 07/12/2008  . Essential hypertension 02/04/2008  . CONSTIPATION, CHRONIC 01/01/2008  . LEG PAIN, LEFT 01/01/2008  . OSTEOPOROSIS 01/01/2008  . HYPERPARATHYROIDISM UNSPECIFIED 10/20/2007  . HYPERLIPIDEMIA 07/07/2007  . ANEMIA-NOS 07/07/2007  . URINARY INCONTINENCE 07/07/2007    Past Surgical History:  Procedure Laterality Date  . BUNIONECTOMY    . COLONOSCOPY    . LAPAROSCOPIC APPENDECTOMY  01/19/2016   Procedure: APPENDECTOMY LAPAROSCOPIC with orifice of cecal polyp;  Surgeon: Leighton Ruff, MD;  Location: [facility] ORS;  Service: General;;  . [place] open neck exploration  . POLYPECTOMY    . RIGHT OOPHORECTOMY    . TONSILLECTOMY      OB History    Gravida Para Term Preterm AB Living   3  3   SAB TAB Ectopic Multiple Live Births                   Home Medications    Prior to Admission medications   Medication Sig Start Date End Date Taking? Authorizing Provider  aspirin 81 MG tablet Take 81 mg by mouth daily.     Yes Historical Provider, MD  atorvastatin (LIPITOR) 20 MG tablet TAKE 1 TABLET EACH DAY. Patient taking differently: Take 20 mg by mouth daily.  02/19/16  Yes Brittainy Erie Noe, PA-C  cholecalciferol (VITAMIN D) 1000 UNITS tablet Take 1,000 Units by mouth daily.   Yes Historical Provider, MD  diphenhydramine-acetaminophen (TYLENOL PM) 25-500 MG TABS Take 0.5 tablets by mouth at bedtime as needed (For sleep.).    Yes Historical Provider, MD  HYDROcodone-acetaminophen (NORCO/VICODIN) 5-325 MG tablet Take 1 tablet by mouth every 4 (four) hours as needed for moderate pain. 02/06/17  Yes Daleen Bo, MD  losartan (COZAAR) 25 MG tablet Take 1 tablet (25 mg total) by mouth daily. 02/19/16  Yes Brittainy Erie Noe, PA-C  oxybutynin (DITROPAN-XL) 5 MG 24 hr tablet Take 5 mg by mouth once a week.  08/18/16  Yes Historical Provider, MD  polyethylene glycol powder (GLYCOLAX/MIRALAX) powder Take 0.5 Containers by mouth daily as needed for mild constipation or moderate constipation.  09/23/16  Yes Historical Provider, MD    Family History Family History  Problem Relation Age of Onset  . Sudden death Father 79  . Heart disease Father 54    MI  . Heart disease Mother     valve replacement  . Breast cancer Other     1st degree relative <50  . Diabetes      1st degree relative  . Colon cancer Neg Hx   . Stomach cancer Neg Hx     Social History Social History  Substance Use Topics  . Smoking status: Never Smoker  . Smokeless tobacco: Never Used  . Alcohol use 3.0 oz/week    5 Glasses of wine per week     Comment: occ. wine     Allergies   Anesthetics, amide and Lisinopril   Review of Systems Review of Systems 10/14 systems reviewed and are negative other than those stated in the HPI   Physical Exam Updated Vital Signs BP (!) 161/77   Pulse 73   Temp 98.1 F (36.7 C) (Oral)   Resp 17   SpO2 90%   Physical Exam Physical Exam  Nursing note and vitals reviewed. Constitutional: non-toxic, in mild distress secondary to nausea, moaning and groaning due to nausea Head: Normocephalic and atraumatic.  Mouth/Throat: Oropharynx is clear and dry.  Neck:  Normal range of motion. Neck supple.  Cardiovascular: Normal rate and regular rhythm.   Pulmonary/Chest: Effort normal and breath sounds normal.  Abdominal: Soft. There is diffuse tenderness. Mild distension. No fecal impaction on rectal exam. There is no rebound and no guarding.  Musculoskeletal: Normal range of motion of lower extremities.  Neurological: Alert, no facial droop, fluent speech, moves all extremities symmetrically, PEERL, sensation to light touch in tact throughout, dysmetria w/ finger to nose bilaterally in upper extremities but no dysmetria with heel-knee-shin bilaterally in lower extremities Skin: Skin is warm and dry.  Psychiatric: Cooperative   ED Treatments / Results  Labs (all labs ordered are listed, but only abnormal results are displayed) Labs Reviewed  CBC WITH DIFFERENTIAL/PLATELET - Abnormal; Notable for the following:       Result  Value   Lymphs Abs 0.6 (*)    All other components within normal limits  COMPREHENSIVE METABOLIC PANEL - Abnormal; Notable for the following:    Sodium 133 (*)    CO2 21 (*)    Glucose, Bld 121 (*)    Creatinine, Ser 0.43 (*)    Calcium 8.4 (*)    Total Bilirubin 2.8 (*)    All other components within normal limits  LIPASE, BLOOD    EKG  EKG Interpretation None       Radiology Dg Thoracic Spine 2 View  Result Date: 02/06/2017 CLINICAL DATA:  [place] today.  Back pain. EXAM: THORACIC SPINE 2 VIEWS COMPARISON:  Chest CT 10/13/2016 FINDINGS: Thoracolumbar scoliosis and degenerative changes in the thoracic spine. There is a compression deformity of the T11 vertebral body not present on the prior CT scan, likely an acute compression fracture. There is a remote compression deformity of T8. IMPRESSION: Suspect acute compression fracture of T11. Electronically Signed   By: Marijo Sanes M.D.   On: 02/06/2017 16:28   Dg Lumbar Spine Complete  Result Date: 02/06/2017 CLINICAL DATA:  Recent fall with low back pain, initial  encounter EXAM: LUMBAR SPINE - COMPLETE 4+ VIEW COMPARISON:  None. FINDINGS: Scoliosis concave to the left is noted. No pars defects are seen. No spondylolisthesis is noted. No compression deformities are noted. Very mild osteophytic changes are seen. Postsurgical changes in the pelvis are noted. IMPRESSION: No acute abnormality noted.  Mild degenerative change is seen. Electronically Signed   By: Inez Catalina M.D.   On: 02/06/2017 15:57   Ct Abdomen Pelvis W Contrast  Result Date: 02/07/2017 CLINICAL DATA:  76 year old female with generalized abdominal pain, nausea, constipation. Recent fall with suspected T11 vertebral fracture on plain films yesterday. EXAM: CT ABDOMEN AND PELVIS WITH CONTRAST TECHNIQUE: Multidetector CT imaging of the abdomen and pelvis was performed using the standard protocol following bolus administration of intravenous contrast. CONTRAST:  1 ISOVUE-300 IOPAMIDOL (ISOVUE-300) INJECTION 61% COMPARISON:  Lumbar radiographs 02/06/2017. CT Abdomen and Pelvis 11/07/2015 FINDINGS: Lower chest: No pericardial or pleural effusion. Patchy opacity in both costophrenic angles, most resembling atelectasis. Hepatobiliary: Stable and negative liver. Negative gallbladder. Stable biliary tree. Pancreas: Negative. Spleen: Negative. Adrenals/Urinary Tract: Normal adrenal glands. Chronic left lower pole 11 mm calculus. Bilateral renal enhancement and contrast excretion is normal. No abdominal free fluid. Unremarkable urinary bladder. Stomach/Bowel: Negative rectosigmoid colon aside from retained stool. Redundant left colon with retained stool. Redundant splenic flexure an transverse colon. Retained stool in the right colon. The cecum is located in the anterior pelvis in the midline. Evidence of previous appendectomy. Negative terminal ileum. No dilated small bowel. Oral contrast has not yet reached the distal small bowel. Negative stomach. The duodenum is remarkable for a smooth 2.6 cm simple fluid density  cystic lesion arising along the undersurface of the fourth portion. This is chronic and was 2.1 cm in 2016. No surrounding inflammation. Otherwise negative duodenum. Vascular/Lymphatic: Aortoiliac calcified atherosclerosis. Major arterial structures in the abdomen and pelvis are patent. Portal venous system is patent. No lymphadenopathy. Reproductive: Negative uterus and ovaries. Other: Trace free fluid and small surgical clip in the cul-de-sac, nonspecific. Musculoskeletal: Dextroconvex thoracolumbar scoliosis. T12 compression fracture with 30% loss of vertebral body height. Mild retropulsion of the posterosuperior endplate, more so on the left (series 2, image 7). Mild associated spinal stenosis with likely no mass effect on the lower thoracic spinal cord. The T12 pedicles and posterior elements appear intact. Other visualized spinal  levels are intact. No paraspinal hematoma. Lower lumbar moderate to severe facet degeneration. No superimposed pelvic fracture identified. IMPRESSION: 1. T12 compression fracture with 30% loss of vertebral body height and mild retropulsion of bone resulting in mild T12 level spinal stenosis. 2. Trace pelvic free fluid is nonspecific. No focal inflammatory process is identified in the abdomen or pelvis. 3. 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 See coronal image 46. 4. Calcified aortic atherosclerosis. 5. Mild lung base atelectasis. 6. Chronic left lower pole nephrolithiasis. Electronically Signed   By: Genevie Ann M.D.   On: 02/07/2017 20:58    Procedures Procedures (including critical care time)  Medications Ordered in ED Medications  iopamidol (ISOVUE-300) 61 % injection (not administered)  ondansetron (ZOFRAN) injection 4 mg (4 mg Intravenous Given 02/07/17 1851)  sodium chloride 0.9 % bolus 1,000 mL (0 mLs Intravenous Stopped 02/07/17 2226)  iopamidol (ISOVUE-300) 61 % injection (100 mLs  Contrast Given 02/07/17 2023)  meclizine (ANTIVERT)  tablet 50 mg (50 mg Oral Given 02/07/17 2222)  metoCLOPramide (REGLAN) injection 10 mg (10 mg Intravenous Given 02/07/17 2222)     Initial Impression / Assessment and Plan / ED Course  I have reviewed the triage vital signs and the nursing notes.  Pertinent labs & imaging results that were available during my care of the patient were reviewed by me and considered in my medical decision making (see chart for details).     Presents with nausea, dizziness, constipation. Patient not the best historian, initially stating lightheaded but then "like the room is rocking" that seem worse with movement and eye opening. Potentially more vertigo rather than near syncope/lightheadedness.  Vitals stable. No fecal impaction. Non-surgical abdomen but endorsing diffuse tenderness. CT abd/pelvis performed, visualized and without acute intraabdominal processes. Persistent nausea and dizziness despite meclizine and multiple anti-emetics. Unable to ambulate. History of BPPV. States chronic diplopia from cataracts, which may explain why there is dysmetria with bilateral UE and no dysmetria with heel knee shin in bilateral LE extremities. Less concerning for central vertigo.   Discussed with hospitalist service. Will admit for symptomatic management.   Final Clinical Impressions(s) / ED Diagnoses   Final diagnoses:  Vertigo    New Prescriptions New Prescriptions   No medications on file     Forde Dandy, MD 02/07/17 [place] Joh Rao, MD 02/07/17 2352

## 2017-02-07 NOTE — ED Triage Notes (Signed)
Pt states she is here for constipation.

## 2017-02-07 NOTE — ED Notes (Signed)
Pt reports she was seen here yesterday d/t compression fx of her mid back.  Has been taking narcotics for pain and has not had a BM x 1 week.  Was instructed to take stool softener yesterday but did not comply.  Constant nausea reported, denies vomiting at this time.  Pt reports burping but is not passing gas.  Bowel sounds through out noted.  Pt moans and groans every few minutes but denies any abd pain.  Drinking PO contrast

## 2017-02-08 DIAGNOSIS — Z7982 Long term (current) use of aspirin: Secondary | ICD-10-CM | POA: Diagnosis not present

## 2017-02-08 DIAGNOSIS — I951 Orthostatic hypotension: Secondary | ICD-10-CM | POA: Diagnosis present

## 2017-02-08 DIAGNOSIS — S22000A Wedge compression fracture of unspecified thoracic vertebra, initial encounter for closed fracture: Secondary | ICD-10-CM | POA: Diagnosis not present

## 2017-02-08 DIAGNOSIS — M810 Age-related osteoporosis without current pathological fracture: Secondary | ICD-10-CM | POA: Diagnosis present

## 2017-02-08 DIAGNOSIS — R110 Nausea: Secondary | ICD-10-CM | POA: Diagnosis present

## 2017-02-08 DIAGNOSIS — Z9181 History of falling: Secondary | ICD-10-CM | POA: Diagnosis not present

## 2017-02-08 DIAGNOSIS — W19XXXA Unspecified fall, initial encounter: Secondary | ICD-10-CM | POA: Diagnosis present

## 2017-02-08 DIAGNOSIS — R42 Dizziness and giddiness: Secondary | ICD-10-CM

## 2017-02-08 DIAGNOSIS — Z884 Allergy status to anesthetic agent status: Secondary | ICD-10-CM | POA: Diagnosis not present

## 2017-02-08 DIAGNOSIS — Z888 Allergy status to other drugs, medicaments and biological substances status: Secondary | ICD-10-CM | POA: Diagnosis not present

## 2017-02-08 DIAGNOSIS — I10 Essential (primary) hypertension: Secondary | ICD-10-CM | POA: Diagnosis not present

## 2017-02-08 DIAGNOSIS — E213 Hyperparathyroidism, unspecified: Secondary | ICD-10-CM | POA: Diagnosis present

## 2017-02-08 DIAGNOSIS — S22080A Wedge compression fracture of T11-T12 vertebra, initial encounter for closed fracture: Secondary | ICD-10-CM

## 2017-02-08 DIAGNOSIS — M1990 Unspecified osteoarthritis, unspecified site: Secondary | ICD-10-CM | POA: Diagnosis present

## 2017-02-08 DIAGNOSIS — S22088A Other fracture of T11-T12 vertebra, initial encounter for closed fracture: Secondary | ICD-10-CM | POA: Diagnosis present

## 2017-02-08 DIAGNOSIS — Z79899 Other long term (current) drug therapy: Secondary | ICD-10-CM | POA: Diagnosis not present

## 2017-02-08 DIAGNOSIS — T40605A Adverse effect of unspecified narcotics, initial encounter: Secondary | ICD-10-CM | POA: Diagnosis present

## 2017-02-08 DIAGNOSIS — E785 Hyperlipidemia, unspecified: Secondary | ICD-10-CM | POA: Diagnosis present

## 2017-02-08 DIAGNOSIS — K5900 Constipation, unspecified: Secondary | ICD-10-CM | POA: Diagnosis not present

## 2017-02-08 DIAGNOSIS — R2689 Other abnormalities of gait and mobility: Secondary | ICD-10-CM | POA: Diagnosis present

## 2017-02-08 DIAGNOSIS — E860 Dehydration: Secondary | ICD-10-CM | POA: Diagnosis present

## 2017-02-08 DIAGNOSIS — R4182 Altered mental status, unspecified: Secondary | ICD-10-CM | POA: Diagnosis present

## 2017-02-08 DIAGNOSIS — M419 Scoliosis, unspecified: Secondary | ICD-10-CM | POA: Diagnosis present

## 2017-02-08 DIAGNOSIS — I129 Hypertensive chronic kidney disease with stage 1 through stage 4 chronic kidney disease, or unspecified chronic kidney disease: Secondary | ICD-10-CM | POA: Diagnosis present

## 2017-02-08 DIAGNOSIS — N189 Chronic kidney disease, unspecified: Secondary | ICD-10-CM | POA: Diagnosis present

## 2017-02-08 MED ORDER — SODIUM CHLORIDE 0.9 % IV SOLN
INTRAVENOUS | Status: DC
  Administered 2017-02-08 – 2017-02-09 (×2): via INTRAVENOUS

## 2017-02-08 MED ORDER — SENNA 8.6 MG PO TABS
1.0000 | ORAL_TABLET | Freq: Two times a day (BID) | ORAL | Status: DC
  Administered 2017-02-08 – 2017-02-09 (×4): 8.6 mg via ORAL
  Filled 2017-02-08 (×4): qty 1

## 2017-02-08 MED ORDER — IBUPROFEN 400 MG PO TABS
400.0000 mg | ORAL_TABLET | Freq: Four times a day (QID) | ORAL | Status: DC | PRN
  Administered 2017-02-08 – 2017-02-09 (×4): 400 mg via ORAL
  Filled 2017-02-08 (×3): qty 1

## 2017-02-08 MED ORDER — ENOXAPARIN SODIUM 40 MG/0.4ML ~~LOC~~ SOLN
40.0000 mg | SUBCUTANEOUS | Status: DC
  Administered 2017-02-08 – 2017-02-09 (×2): 40 mg via SUBCUTANEOUS
  Filled 2017-02-08 (×2): qty 0.4

## 2017-02-08 MED ORDER — POLYETHYLENE GLYCOL 3350 17 G PO PACK
17.0000 g | PACK | Freq: Every day | ORAL | Status: DC
  Administered 2017-02-09: 17 g via ORAL
  Filled 2017-02-08: qty 1

## 2017-02-08 MED ORDER — HYDROCODONE-ACETAMINOPHEN 5-325 MG PO TABS
1.0000 | ORAL_TABLET | Freq: Three times a day (TID) | ORAL | Status: DC | PRN
  Administered 2017-02-08: 1 via ORAL
  Filled 2017-02-08: qty 1

## 2017-02-08 MED ORDER — OXYBUTYNIN CHLORIDE ER 5 MG PO TB24
5.0000 mg | ORAL_TABLET | ORAL | Status: DC
  Administered 2017-02-09: 5 mg via ORAL
  Filled 2017-02-08: qty 1

## 2017-02-08 MED ORDER — POLYETHYLENE GLYCOL 3350 17 G PO PACK
17.0000 g | PACK | Freq: Every day | ORAL | Status: DC | PRN
  Administered 2017-02-08: 17 g via ORAL
  Filled 2017-02-08: qty 1

## 2017-02-08 NOTE — H&P (Signed)
History and Physical    [name] KKX:[id number] DOB: 1941-11-03 DOA: 02/07/2017  PCP: Kelton Pillar, MD  Patient coming from: Home  I have personally briefly reviewed patient's old medical records in [place]  Chief Complaint: Back pain, nausea, vertigo  HPI: [name] is a 76 y.o. female.  She was seen in the emergency department one day ago after mechanical fall with back pain. Has x-ray that was concerning for a T11 vertebral body fracture. Pain was controlled and she was sent for outpatient follow-up. Was discharged home with hydrocodone which she has been taking every 4 hours. This morning at around 11:30 with potentially her last dose and since then she has been having severe nausea, lightheadedness, and constipation. Unsure of what her last bowel movement was but it was several days ago. Patient did not take any laxatives. Has not had vomiting, fevers, lower extremity numbness or weakness, urinary complaints. Family states she seems "more out of it" today.    ED Course: Given fentanyl in ambulance, zofran, reglan, ativan, and meclazine in ED, she is now sleeping but does have severe back pain (as fentanyl has worn off).   Review of Systems: As per HPI otherwise 10 point review of systems negative.   Past Medical History:  Diagnosis Date  . Abdominal pain, unspecified site   . ADVERSE REACTION TO MEDICATION 07/31/2009  . ANEMIA-NOS 07/07/2007  . Arthritis   . Chest pain, unspecified   . Chronic kidney disease    hx of kidney stone  . COLONIC POLYPS, ADENOMATOUS, HX OF   . Disturbance of skin sensation   . DIVERTICULOSIS, COLON 02/23/2009  . Family history of diabetes mellitus   . Family history of malignant neoplasm of breast   . FATIGUE 05/25/2009  . Fever, unspecified   . Heart murmur    hx of  . HYPERLIPIDEMIA 07/07/2007  . HYPERPARATHYROIDISM UNSPECIFIED 10/20/2007  . HYPERTENSION 02/04/2008  . NEPHROLITHIASIS, HX OF 11/15/2008  . OSTEOPOROSIS  01/01/2008  . Other acquired absence of organ    parathyroidectomy  . Pain in limb   . PALPITATIONS, OCCASIONAL 07/12/2008  . PARATHYROIDECTOMY 01/02/2009  . SCOLIOSIS 05/25/2009  . Seasonal allergies   . Unspecified constipation   . URINARY INCONTINENCE 07/07/2007  . UTI'S, RECURRENT 11/20/2009   kimbrough     Past Surgical History:  Procedure Laterality Date  . BUNIONECTOMY    . COLONOSCOPY    . LAPAROSCOPIC APPENDECTOMY  01/19/2016   Procedure: APPENDECTOMY LAPAROSCOPIC with orifice of cecal polyp;  Surgeon: Leighton Ruff, MD;  Location: [facility];  Service: General;;  . [place] open neck exploration  . POLYPECTOMY    . RIGHT OOPHORECTOMY    . TONSILLECTOMY       reports that she has never smoked. She has never used smokeless tobacco. She reports that she drinks about 3.0 oz of alcohol per week . She reports that she does not use drugs.  Allergies  Allergen Reactions  . Anesthetics, [place] Other (See Comments)    Pt states she had hallucinations and HTN after procedure 01/19/16-she feels may have been reaction to anesthetic  . Lisinopril Cough    Family History  Problem Relation Age of Onset  . Sudden death Father 36  . Heart disease Father 22    MI  . Heart disease Mother     valve replacement  . Breast cancer Other     1st degree relative <50  . Diabetes  1st degree relative  . Colon cancer Neg Hx   . Stomach cancer Neg Hx      Prior to Admission medications   Medication Sig Start Date End Date Taking? Authorizing Provider  aspirin 81 MG tablet Take 81 mg by mouth daily.     Yes Historical Provider, MD  atorvastatin (LIPITOR) 20 MG tablet TAKE 1 TABLET EACH DAY. Patient taking differently: Take 20 mg by mouth daily.  02/19/16  Yes Brittainy Erie Noe, PA-C  cholecalciferol (VITAMIN D) 1000 UNITS tablet Take 1,000 Units by mouth daily.   Yes Historical Provider, MD  diphenhydramine-acetaminophen (TYLENOL PM) 25-500 MG TABS Take 0.5 tablets by  mouth at bedtime as needed (For sleep.).    Yes Historical Provider, MD  HYDROcodone-acetaminophen (NORCO/VICODIN) 5-325 MG tablet Take 1 tablet by mouth every 4 (four) hours as needed for moderate pain. 02/06/17  Yes Daleen Bo, MD  losartan (COZAAR) 25 MG tablet Take 1 tablet (25 mg total) by mouth daily. 02/19/16  Yes Brittainy Erie Noe, PA-C  oxybutynin (DITROPAN-XL) 5 MG 24 hr tablet Take 5 mg by mouth once a week.  08/18/16  Yes Historical Provider, MD  polyethylene glycol powder (GLYCOLAX/MIRALAX) powder Take 0.5 Containers by mouth daily as needed for mild constipation or moderate constipation.  09/23/16  Yes Historical Provider, MD    Physical Exam: Vitals:   02/07/17 1951 02/07/17 2230 02/07/17 2244 02/07/17 2300  BP: (!) 190/90 (!) 166/78 (!) 166/78 (!) 161/77  Pulse: 74 78 76 73  Resp: 20  17   Temp:      TempSrc:      SpO2: 97% 91% 93% 90%    Constitutional: NAD, calm, comfortable Eyes: PERRL, lids and conjunctivae normal ENMT: Mucous membranes are moist. Posterior pharynx clear of any exudate or lesions.Normal dentition.  Neck: normal, supple, no masses, no thyromegaly Respiratory: clear to auscultation bilaterally, no wheezing, no crackles. Normal respiratory effort. No accessory muscle use.  Cardiovascular: Regular rate and rhythm, no murmurs / rubs / gallops. No extremity edema. 2+ pedal pulses. No carotid bruits.  Abdomen: no tenderness, no masses palpated. No hepatosplenomegaly. Bowel sounds positive.  Musculoskeletal: no clubbing / cyanosis. No joint deformity upper and lower extremities. Good ROM, no contractures. Normal muscle tone.  Skin: no rashes, lesions, ulcers. No induration Neurologic: CN 2-12 grossly intact. Sensation intact, DTR normal. Strength 5/5 in all 4.  Psychiatric: Normal judgment and insight. Alert and oriented x 3. Normal mood.    Labs on Admission: I have personally reviewed following labs and imaging studies  CBC:  Recent Labs Lab  02/07/17 1822  WBC 8.7  NEUTROABS 7.6  HGB 14.7  HCT 42.2  MCV 91.7  PLT 381   Basic Metabolic Panel:  Recent Labs Lab 02/07/17 1822  NA 133*  K 4.0  CL 102  CO2 21*  GLUCOSE 121*  BUN 11  CREATININE 0.43*  CALCIUM 8.4*   GFR: CrCl cannot be calculated (Unknown ideal weight.). Liver Function Tests:  Recent Labs Lab 02/07/17 1822  AST 28  ALT 15  ALKPHOS 57  BILITOT 2.8*  PROT 7.0  ALBUMIN 4.2    Recent Labs Lab 02/07/17 1822  LIPASE 12   No results for input(s): AMMONIA in the last 168 hours. Coagulation Profile: No results for input(s): INR, PROTIME in the last 168 hours. Cardiac Enzymes: No results for input(s): CKTOTAL, CKMB, CKMBINDEX, TROPONINI in the last 168 hours. BNP (last 3 results) No results for input(s): PROBNP in the last 8760 hours.  HbA1C: No results for input(s): HGBA1C in the last 72 hours. CBG: No results for input(s): GLUCAP in the last 168 hours. Lipid Profile: No results for input(s): CHOL, HDL, LDLCALC, TRIG, CHOLHDL, LDLDIRECT in the last 72 hours. Thyroid Function Tests: No results for input(s): TSH, T4TOTAL, FREET4, T3FREE, THYROIDAB in the last 72 hours. Anemia Panel: No results for input(s): VITAMINB12, FOLATE, FERRITIN, TIBC, IRON, RETICCTPCT in the last 72 hours. Urine analysis:    Component Value Date/Time   COLORURINE YELLOW 01/28/2010 1858   APPEARANCEUR CLOUDY (A) 01/28/2010 1858   LABSPEC 1.016 01/28/2010 1858   PHURINE 7.0 01/28/2010 1858   GLUCOSEU NEGATIVE 01/28/2010 1858   HGBUR NEGATIVE 01/28/2010 1858   HGBUR negative 11/20/2009 1055   BILIRUBINUR Neg 02/28/2015 1129   KETONESUR 40 (A) 01/28/2010 1858   PROTEINUR Neg 02/28/2015 1129   PROTEINUR NEGATIVE 01/28/2010 1858   UROBILINOGEN 0.2 02/28/2015 1129   UROBILINOGEN 1.0 01/28/2010 1858   NITRITE Neg 02/28/2015 1129   NITRITE POSITIVE (A) 01/28/2010 1858   LEUKOCYTESUR Negative 02/28/2015 1129    Radiological Exams on Admission: Dg Thoracic  Spine 2 View  Result Date: 02/06/2017 CLINICAL DATA:  [place] today.  Back pain. EXAM: THORACIC SPINE 2 VIEWS COMPARISON:  Chest CT 10/13/2016 FINDINGS: Thoracolumbar scoliosis and degenerative changes in the thoracic spine. There is a compression deformity of the T11 vertebral body not present on the prior CT scan, likely an acute compression fracture. There is a remote compression deformity of T8. IMPRESSION: Suspect acute compression fracture of T11. Electronically Signed   By: Marijo Sanes M.D.   On: 02/06/2017 16:28   Dg Lumbar Spine Complete  Result Date: 02/06/2017 CLINICAL DATA:  Recent fall with low back pain, initial encounter EXAM: LUMBAR SPINE - COMPLETE 4+ VIEW COMPARISON:  None. FINDINGS: Scoliosis concave to the left is noted. No pars defects are seen. No spondylolisthesis is noted. No compression deformities are noted. Very mild osteophytic changes are seen. Postsurgical changes in the pelvis are noted. IMPRESSION: No acute abnormality noted.  Mild degenerative change is seen. Electronically Signed   By: Inez Catalina M.D.   On: 02/06/2017 15:57   Ct Abdomen Pelvis W Contrast  Result Date: 02/07/2017 CLINICAL DATA:  76 year old female with generalized abdominal pain, nausea, constipation. Recent fall with suspected T11 vertebral fracture on plain films yesterday. EXAM: CT ABDOMEN AND PELVIS WITH CONTRAST TECHNIQUE: Multidetector CT imaging of the abdomen and pelvis was performed using the standard protocol following bolus administration of intravenous contrast. CONTRAST:  1 ISOVUE-300 IOPAMIDOL (ISOVUE-300) INJECTION 61% COMPARISON:  Lumbar radiographs 02/06/2017. CT Abdomen and Pelvis 11/07/2015 FINDINGS: Lower chest: No pericardial or pleural effusion. Patchy opacity in both costophrenic angles, most resembling atelectasis. Hepatobiliary: Stable and negative liver. Negative gallbladder. Stable biliary tree. Pancreas: Negative. Spleen: Negative. Adrenals/Urinary Tract: Normal adrenal glands.  Chronic left lower pole 11 mm calculus. Bilateral renal enhancement and contrast excretion is normal. No abdominal free fluid. Unremarkable urinary bladder. Stomach/Bowel: Negative rectosigmoid colon aside from retained stool. Redundant left colon with retained stool. Redundant splenic flexure an transverse colon. Retained stool in the right colon. The cecum is located in the anterior pelvis in the midline. Evidence of previous appendectomy. Negative terminal ileum. No dilated small bowel. Oral contrast has not yet reached the distal small bowel. Negative stomach. The duodenum is remarkable for a smooth 2.6 cm simple fluid density cystic lesion arising along the undersurface of the fourth portion. This is chronic and was 2.1 cm in 2016. No surrounding inflammation. Otherwise  negative duodenum. Vascular/Lymphatic: Aortoiliac calcified atherosclerosis. Major arterial structures in the abdomen and pelvis are patent. Portal venous system is patent. No lymphadenopathy. Reproductive: Negative uterus and ovaries. Other: Trace free fluid and small surgical clip in the cul-de-sac, nonspecific. Musculoskeletal: Dextroconvex thoracolumbar scoliosis. T12 compression fracture with 30% loss of vertebral body height. Mild retropulsion of the posterosuperior endplate, more so on the left (series 2, image 7). Mild associated spinal stenosis with likely no mass effect on the lower thoracic spinal cord. The T12 pedicles and posterior elements appear intact. Other visualized spinal levels are intact. No paraspinal hematoma. Lower lumbar moderate to severe facet degeneration. No superimposed pelvic fracture identified. IMPRESSION: 1. T12 compression fracture with 30% loss of vertebral body height and mild retropulsion of bone resulting in mild T12 level spinal stenosis. 2. Trace pelvic free fluid is nonspecific. No focal inflammatory process is identified in the abdomen or pelvis. 3. 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 See coronal image 46. 4. Calcified aortic atherosclerosis. 5. Mild lung base atelectasis. 6. Chronic left lower pole nephrolithiasis. Electronically Signed   By: Genevie Ann M.D.   On: 02/07/2017 20:58    EKG: Independently reviewed.  Assessment/Plan Principal Problem:   Compression fracture of thoracic vertebra (HCC) Active Problems:   Essential hypertension   Dizziness    1. Compression fx of thoracic vertebra -  1. Norco PRN pain 1. If pain controlled in AM, then continue this PRN at home 2. If not controlled then will need to call IR for possible kyphoplasty / vertebroplasty 2. Dizziness - 1. Continue meclizine and zofran PRN 2. If dizziness persists despite all of above then can get MRI brain, however primary CVA felt to be less likely as cause of dizziness.  Med side effect, effect of pain due to compression fx, and even BPPV are far more likely. 3. HTN - continue home BP meds.  DVT prophylaxis: Lovenox Code Status: Full Family Communication: Family at bedside Disposition Plan: Home after admit Consults called: None Admission status: Place in [place], [place] [facility] Pager [phone]  If 7AM-7PM, please contact day team taking care of patient [email or web address] Password [facility]  02/08/2017, 12:24 AM

## 2017-02-08 NOTE — Progress Notes (Signed)
Patient became  light headed  (faint- per pt) while ambulating with PT . Patients  BP 74/57. Patient placed in recliner feet elevated and  BP retaken  131/70. Temperture. 99.3.  Per PT patient will benefit from rolling walker. Dr. Dyann Kief on unit and was notified of patients low BP.

## 2017-02-08 NOTE — Evaluation (Signed)
Physical Therapy Evaluation Patient Details Name: [name] MRN: [id number] DOB: 12/09/1940 Today's Date: 02/08/2017   History of Present Illness  [name] is a 76 y.o. female.  She was seen in the emergency department  3/22 after mechanical fall with  Fall with isolated T11 compression fracture injury. Pain was controlled and she was sent for outpatient follow-up. Returns to Ed 02/07/17 with dizziness.felt to be related to medication  Clinical Impression  The patient had ambulated in room and hall earlier. During this visit, the patient complained of  Feeling weak. Assited to sitting down in chair in hallway. BP 74/57. After assisted to recliner with the legs elevated BP 131/70. HR 70. Patient felt warm to touch . Temp 99.3. RN and MD aware. Pt admitted with above diagnosis. Pt currently with functional limitations due to the deficits listed below (see PT Problem List).  Pt will benefit from skilled PT to increase their independence and safety with mobility to allow discharge to the venue listed below.       Follow Up Recommendations No PT follow up    Equipment Recommendations  Rolling walker with 5" wheels    Recommendations for Other Services       Precautions / Restrictions Precautions Precautions: Fall Precaution Comments: orthostatic on Eval      Mobility  Bed Mobility               General bed mobility comments: coming out of BR with family in the room.  Transfers Overall transfer level: Needs assistance Equipment used: Rolling walker (2 wheeled) Transfers: Sit to/from Stand Sit to Stand: Supervision            Ambulation/Gait Ambulation/Gait assistance: Min guard Ambulation Distance (Feet): 100 Feet Assistive device: Rolling walker (2 wheeled) Gait Pattern/deviations: Step-through pattern     General Gait Details: complained of feeling weak and  feeling will pass out. Assisted to chair. BP 74/57RN notified.  Stairs             Wheelchair Mobility    Modified Rankin (Stroke Patients Only)       Balance                                             Pertinent Vitals/Pain Pain Assessment: 0-10 Pain Score: 2  Pain Location: back Pain Descriptors / Indicators: Discomfort Pain Intervention(s): Monitored during session;Premedicated before session    Home Living Family/patient expects to be discharged to:: Private residence Living Arrangements: Spouse/significant other Available Help at Discharge: Family Type of Home: House Home Access: Stairs to enter Entrance Stairs-Rails: Right Entrance Stairs-Number of Steps: 5 Home Layout: One level Home Equipment: None      Prior Function                 Hand Dominance        Extremity/Trunk Assessment   Upper Extremity Assessment Upper Extremity Assessment: Overall WFL for tasks assessed    Lower Extremity Assessment Lower Extremity Assessment: Overall WFL for tasks assessed    Cervical / Trunk Assessment Cervical / Trunk Assessment: Kyphotic  Communication      Cognition Arousal/Alertness: Awake/alert Behavior During Therapy: WFL for tasks assessed/performed Overall Cognitive Status: Within Functional Limits for tasks assessed  General Comments      Exercises     Assessment/Plan    PT Assessment Patient needs continued PT services  PT Problem List Decreased strength;Decreased activity tolerance;Decreased mobility;Cardiopulmonary status limiting activity;Decreased knowledge of precautions;Decreased safety awareness;Decreased knowledge of use of DME;Pain       PT Treatment Interventions DME instruction;Gait training;Stair training    PT Goals (Current goals can be found in the Care Plan section)  Acute Rehab PT Goals Patient Stated Goal: to go home PT Goal Formulation: With patient/family Time For Goal Achievement: 02/15/17 Potential to Achieve Goals:  Good    Frequency Min 3X/week   Barriers to discharge        Co-evaluation               End of Session   Activity Tolerance: Treatment limited secondary to medical complications (Comment) Patient left: in chair;with call bell/phone within reach;with family/visitor present Nurse Communication: Mobility status PT Visit Diagnosis: Unsteadiness on feet (R26.81);Dizziness and giddiness (R42);Pain;History of falling (Z91.81)    Time: 0223-3612 PT Time Calculation (min) (ACUTE ONLY): 21 min   Charges:   PT Evaluation $PT Eval Low Complexity: 1 Procedure     PT G Codes:   PT G-Codes **NOT FOR INPATIENT CLASS** Functional Assessment Tool Used: Clinical judgement;AM-PAC 6 Clicks Basic Mobility Functional Limitation: Mobility: Walking and moving around Mobility: Walking and Moving Around Current Status ([id number]): At least 1 percent but less than 20 percent impaired, limited or restricted Mobility: Walking and Moving Around Goal Status [phone]): 0 percent impaired, limited or restricted    [name] PT [phone] }  Claretha Cooper 02/08/2017, 12:55 PM

## 2017-02-08 NOTE — Progress Notes (Signed)
Patient seen and examined. Admitted after midnight secondary to confusion, back pain, lightheadedness and constipation. Patient with recent compression fracture on T11, has been using q4hr hydrocodone and presented with what appears to be side effect from medications. Had also hx of vertigo and reported to admitting physician some symptoms of room spinning sensation. No fever, no CP, no SOB. On my exam mentation has improved and is basically back to baseline. While attempting PT assessment patient felt dizzy and found to be orthostatic. Please refer to H&P written by Dr. Alcario Drought for further info/details on admission.  Plan: -will provide IVF's -minimize use of narcotics -check TSH and B12 -repeat orthostatic VS in am -continue supportive care  Barton Dubois [phone]

## 2017-02-09 DIAGNOSIS — K5900 Constipation, unspecified: Secondary | ICD-10-CM

## 2017-02-09 DIAGNOSIS — I10 Essential (primary) hypertension: Secondary | ICD-10-CM

## 2017-02-09 LAB — BASIC METABOLIC PANEL
Anion gap: 5 (ref 5–15)
BUN: 11 mg/dL (ref 6–20)
CO2: 25 mmol/L (ref 22–32)
Calcium: 8 mg/dL — ABNORMAL LOW (ref 8.9–10.3)
Chloride: 112 mmol/L — ABNORMAL HIGH (ref 101–111)
Creatinine, Ser: 0.53 mg/dL (ref 0.44–1.00)
GFR calc Af Amer: >60 mL/min (ref >60)
GFR calc non Af Amer: >60 mL/min (ref >60)
Glucose, Bld: 90 mg/dL (ref 65–99)
Potassium: 3.9 mmol/L (ref 3.5–5.1)
Sodium: 142 mmol/L (ref 135–145)

## 2017-02-09 LAB — CORTISOL: Cortisol, Plasma: 7.4 ug/dL

## 2017-02-09 LAB — CBC
HCT: 35.9 % — ABNORMAL LOW (ref 36.0–46.0)
Hemoglobin: 12 g/dL (ref 12.0–15.0)
MCH: 30.6 pg (ref 26.0–34.0)
MCHC: 33.4 g/dL (ref 30.0–36.0)
MCV: 91.6 fL (ref 78.0–100.0)
Platelets: 162 10*3/uL (ref 150–400)
RBC: 3.92 MIL/uL (ref 3.87–5.11)
RDW: 13.4 % (ref 11.5–15.5)
WBC: 6 10*3/uL (ref 4.0–10.5)

## 2017-02-09 LAB — TSH: TSH: 2.808 u[IU]/mL (ref 0.350–4.500)

## 2017-02-09 LAB — VITAMIN B12: Vitamin B-12: 289 pg/mL (ref 180–914)

## 2017-02-09 MED ORDER — BISACODYL 10 MG RE SUPP
10.0000 mg | Freq: Once | RECTAL | Status: AC
  Administered 2017-02-09: 10 mg via RECTAL
  Filled 2017-02-09: qty 1

## 2017-02-09 MED ORDER — POLYETHYLENE GLYCOL 3350 17 GM/SCOOP PO POWD: 0.5000 | Freq: Every day | ORAL | Status: DC

## 2017-02-09 MED ORDER — IBUPROFEN 600 MG PO TABS: 600.0000 mg | ORAL_TABLET | Freq: Four times a day (QID) | ORAL | 0 refills | Status: DC | PRN

## 2017-02-09 MED ORDER — SENNA 8.6 MG PO TABS: 1.0000 | ORAL_TABLET | Freq: Two times a day (BID) | ORAL | 0 refills | Status: DC

## 2017-02-09 NOTE — Discharge Summary (Signed)
Physician Discharge Summary  [name] HKV:[id number] DOB: 1941/10/15 DOA: 02/07/2017  PCP: Kelton Pillar, MD  Admit date: 02/07/2017 Discharge date: 02/09/2017  Time spent: 35 minutes  Recommendations for Outpatient Follow-up:  1. Repeat BMET to follow electrolytes and renal function  2. Reassess BP and adjust medications as needed    Discharge Diagnoses:  Principal Problem:   Compression fracture of thoracic vertebra (HCC) Active Problems:   Essential hypertension   Orthostatic dizziness   Dizziness and giddiness HLD AMS/vertigo Constipation  Discharge Condition: stable and improved. Discharge home with instruction to follow up with orthopedic service as previously arranged and with PCP in 10 days.  Diet recommendation: heart healthy diet   Filed Weights   02/08/17 1029  Weight: 53.6 kg (118 lb 3.2 oz)    History of present illness:  As per H&P written by dr. Alcario Drought on 02/07/17 76 y.o. female.  She was seen in the emergency department one day ago after mechanical fall with back pain. Has x-ray that was concerning for a T11 vertebral body fracture. Pain was controlled and she was sent for outpatient follow-up. Was discharged home with hydrocodone which she has been taking every 4 hours. This morning at around 11:30 with potentially her last dose and since then she has been having severe nausea, lightheadedness, and constipation. Unsure of what her last bowel movement was but it was several days ago. Patient did not take any laxatives. Has not had vomiting, fevers, lower extremity numbness or weakness, urinary complaints. Family states she seems "more out of it" today.   Hospital Course:  1-nausea, vertigo, AMS -secondary to dehydration and medication -patient found to have orthostatic hypotension -symptoms relived after stopping narcotics and providing IVF's -still with slight balance issue -HHPT/HHOT arranged at discharge and rolling walker ordered -TSH and B12  normal  2-essential HTN -BP stable at discharge -will continue home antihypertensive regimen  -advise to follow heart healthy diet   3-compression fracture T11 Will use PRN ibuprofen for pain -HHPT arranged at discharge -patient will follow up with orthopedic service as an outpatient; no neurologic deficit  4-constipation -due to use of narcotics -patient started on miralax and senokot -advise to follow good hydration and increase fiber intake -TSH WNL  5- HLD -continue statins   Procedures:  See below for x-ray report   Consultations:  None   Discharge Exam: Vitals:   02/09/17 0503 02/09/17 1353  BP: (!) 162/82 (!) 182/79  Pulse: 65 68  Resp: 17 16  Temp: 98 F (36.7 C) 97.5 F (36.4 C)    General: afebrile, feeling better overall and denying dizziness. Still slightly unsteady and with intermittent back discomfort. Cardiovascular: S1 and s2, no rubs, no gallops Respiratory: CTA bilaterally Extremities: no edema, no cyanosis  Neuro: no focal neurologic deficit   Discharge Instructions   Discharge Instructions    Diet - low sodium heart healthy    Complete by:  As directed    Discharge instructions    Complete by:  As directed    Keep yourself well hydrated Follow heart healthy diet Follow up with orthopedic service as instructed  Increase activity slowly as tolerated  Follow recommendations from Home health physical therapist and use assistant devices as instructed     Current Discharge Medication List    START taking these medications   Details  ibuprofen (ADVIL,MOTRIN) 600 MG tablet Take 1 tablet (600 mg total) by mouth every 6 (six) hours as needed for moderate pain. Qty: 30 tablet, Refills:  0    senna (SENOKOT) 8.6 MG TABS tablet Take 1 tablet (8.6 mg total) by mouth 2 (two) times daily. Qty: 60 each, Refills: 0      CONTINUE these medications which have CHANGED   Details  polyethylene glycol powder (GLYCOLAX/MIRALAX) powder Take 127.5 g  by mouth daily.      CONTINUE these medications which have NOT CHANGED   Details  aspirin 81 MG tablet Take 81 mg by mouth daily.      atorvastatin (LIPITOR) 20 MG tablet TAKE 1 TABLET EACH DAY. Qty: 90 tablet, Refills: 3    cholecalciferol (VITAMIN D) 1000 UNITS tablet Take 1,000 Units by mouth daily.    losartan (COZAAR) 25 MG tablet Take 1 tablet (25 mg total) by mouth daily. Qty: 90 tablet, Refills: 3    oxybutynin (DITROPAN-XL) 5 MG 24 hr tablet Take 5 mg by mouth every other day.       STOP taking these medications     diphenhydramine-acetaminophen (TYLENOL PM) 25-500 MG TABS      HYDROcodone-acetaminophen (NORCO/VICODIN) 5-325 MG tablet        Allergies  Allergen Reactions  . Anesthetics, Amide Other (See Comments)    Pt states she had hallucinations and HTN after procedure 01/19/16-she feels may have been reaction to anesthetic  . Lisinopril Cough   Follow-up Information    [facility] Follow up.   Specialty:  [place] Why:  Home Health Physical Therapy and Occupational Therapy Contact information: [address] [place] [address] [phone]           The results of significant diagnostics from this hospitalization (including imaging, microbiology, ancillary and laboratory) are listed below for reference.    Significant Diagnostic Studies: Dg Thoracic Spine 2 View  Result Date: 02/06/2017 CLINICAL DATA:  [place] today.  Back pain. EXAM: THORACIC SPINE 2 VIEWS COMPARISON:  Chest CT 10/13/2016 FINDINGS: Thoracolumbar scoliosis and degenerative changes in the thoracic spine. There is a compression deformity of the T11 vertebral body not present on the prior CT scan, likely an acute compression fracture. There is a remote compression deformity of T8. IMPRESSION: Suspect acute compression fracture of T11. Electronically Signed   By: Marijo Sanes M.D.   On: 02/06/2017 16:28   Dg Lumbar Spine Complete  Result Date: 02/06/2017 CLINICAL  DATA:  Recent fall with low back pain, initial encounter EXAM: LUMBAR SPINE - COMPLETE 4+ VIEW COMPARISON:  None. FINDINGS: Scoliosis concave to the left is noted. No pars defects are seen. No spondylolisthesis is noted. No compression deformities are noted. Very mild osteophytic changes are seen. Postsurgical changes in the pelvis are noted. IMPRESSION: No acute abnormality noted.  Mild degenerative change is seen. Electronically Signed   By: Inez Catalina M.D.   On: 02/06/2017 15:57   Ct Abdomen Pelvis W Contrast  Result Date: 02/07/2017 CLINICAL DATA:  76 year old female with generalized abdominal pain, nausea, constipation. Recent fall with suspected T11 vertebral fracture on plain films yesterday. EXAM: CT ABDOMEN AND PELVIS WITH CONTRAST TECHNIQUE: Multidetector CT imaging of the abdomen and pelvis was performed using the standard protocol following bolus administration of intravenous contrast. CONTRAST:  1 ISOVUE-300 IOPAMIDOL (ISOVUE-300) INJECTION 61% COMPARISON:  Lumbar radiographs 02/06/2017. CT Abdomen and Pelvis 11/07/2015 FINDINGS: Lower chest: No pericardial or pleural effusion. Patchy opacity in both costophrenic angles, most resembling atelectasis. Hepatobiliary: Stable and negative liver. Negative gallbladder. Stable biliary tree. Pancreas: Negative. Spleen: Negative. Adrenals/Urinary Tract: Normal adrenal glands. Chronic left lower  pole 11 mm calculus. Bilateral renal enhancement and contrast excretion is normal. No abdominal free fluid. Unremarkable urinary bladder. Stomach/Bowel: Negative rectosigmoid colon aside from retained stool. Redundant left colon with retained stool. Redundant splenic flexure an transverse colon. Retained stool in the right colon. The cecum is located in the anterior pelvis in the midline. Evidence of previous appendectomy. Negative terminal ileum. No dilated small bowel. Oral contrast has not yet reached the distal small bowel. Negative stomach. The duodenum is  remarkable for a smooth 2.6 cm simple fluid density cystic lesion arising along the undersurface of the fourth portion. This is chronic and was 2.1 cm in 2016. No surrounding inflammation. Otherwise negative duodenum. Vascular/Lymphatic: Aortoiliac calcified atherosclerosis. Major arterial structures in the abdomen and pelvis are patent. Portal venous system is patent. No lymphadenopathy. Reproductive: Negative uterus and ovaries. Other: Trace free fluid and small surgical clip in the cul-de-sac, nonspecific. Musculoskeletal: Dextroconvex thoracolumbar scoliosis. T12 compression fracture with 30% loss of vertebral body height. Mild retropulsion of the posterosuperior endplate, more so on the left (series 2, image 7). Mild associated spinal stenosis with likely no mass effect on the lower thoracic spinal cord. The T12 pedicles and posterior elements appear intact. Other visualized spinal levels are intact. No paraspinal hematoma. Lower lumbar moderate to severe facet degeneration. No superimposed pelvic fracture identified. IMPRESSION: 1. T12 compression fracture with 30% loss of vertebral body height and mild retropulsion of bone resulting in mild T12 level spinal stenosis. 2. Trace pelvic free fluid is nonspecific. No focal inflammatory process is identified in the abdomen or pelvis. 3. 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 See coronal image 46. 4. Calcified aortic atherosclerosis. 5. Mild lung base atelectasis. 6. Chronic left lower pole nephrolithiasis. Electronically Signed   By: Genevie Ann M.D.   On: 02/07/2017 20:58   Microbiology: No results found for this or any previous visit (from the past 240 hour(s)).   Labs: Basic Metabolic Panel:  Recent Labs Lab 02/07/17 1822 02/09/17 0404  NA 133* 142  K 4.0 3.9  CL 102 112*  CO2 21* 25  GLUCOSE 121* 90  BUN 11 11  CREATININE 0.43* 0.53  CALCIUM 8.4* 8.0*   Liver Function Tests:  Recent Labs Lab 02/07/17 1822   AST 28  ALT 15  ALKPHOS 57  BILITOT 2.8*  PROT 7.0  ALBUMIN 4.2    Recent Labs Lab 02/07/17 1822  LIPASE 12   CBC:  Recent Labs Lab 02/07/17 1822 02/09/17 0404  WBC 8.7 6.0  NEUTROABS 7.6  --   HGB 14.7 12.0  HCT 42.2 35.9*  MCV 91.7 91.6  PLT 153 162    Signed:  Barton Dubois MD.  [facility] 02/09/2017, 3:12 PM

## 2017-02-09 NOTE — Progress Notes (Signed)
qPhysical Therapy Treatment Patient Details Name: [name] MRN: [id number] DOB: October 26, 1941 Today's Date: 02/09/2017    History of Present Illness [name] is a 76 y.o. female.  She was seen in the emergency department  3/22 after mechanical fall with  Fall with isolated T11 compression fracture injury. Pain was controlled and she was sent for outpatient follow-up. Returns to Ed 02/07/17 with dizziness.    PT Comments    Pt progressing, no dizziness today with amb; pt reports feeling better after all; husband present for stair training; recommend HHPT as pt does demonstrate some balance deficits today  and is at risk for falls   Follow Up Recommendations  Home health PT     Equipment Recommendations  Rolling walker with 5" wheels (youth)    Recommendations for Other Services       Precautions / Restrictions Precautions Precautions: Fall Precaution Comments: orthostatic on Eval--no dizziness today during amb Restrictions Weight Bearing Restrictions: No    Mobility  Bed Mobility Overal bed mobility: Needs Assistance Bed Mobility: Supine to Sit;Sit to Supine     Supine to sit: Supervision;Min guard Sit to supine: Supervision;Min guard   General bed mobility comments: for safety  Transfers Overall transfer level: Needs assistance Equipment used: Rolling walker (2 wheeled) Transfers: Sit to/from Stand Sit to Stand: Supervision         General transfer comment: cues for hand placement, supervision from varied ht surfaces  Ambulation/Gait Ambulation/Gait assistance: Min guard Ambulation Distance (Feet): 60 Feet (10' more) Assistive device: Rolling walker (2 wheeled) Gait Pattern/deviations: Step-through pattern;Decreased stride length;Trunk flexed     General Gait Details: cues RW safety   Stairs Stairs: Yes   Stair Management: One rail Right;Step to pattern;Forwards Number of Stairs: 3 (x2) General stair comments: cues for technique, sequence and  safety  Wheelchair Mobility    Modified Rankin (Stroke Patients Only)       Balance Overall balance assessment: Needs assistance Sitting-balance support: No upper extremity supported;Feet supported Sitting balance-Leahy Scale: Good     Standing balance support: During functional activity;Single extremity supported Standing balance-Leahy Scale: Fair Standing balance comment: able to maintian static stand without UE support, unilateral UE support during dynamic activities                            Cognition Arousal/Alertness: Awake/alert Behavior During Therapy: WFL for tasks assessed/performed Overall Cognitive Status: Within Functional Limits for tasks assessed                                        Exercises      General Comments        Pertinent Vitals/Pain Pain Assessment: Faces Faces Pain Scale: Hurts little more Pain Location: back Pain Descriptors / Indicators: Discomfort;Grimacing Pain Intervention(s): Limited activity within patient's tolerance;Monitored during session    Home Living                      Prior Function            PT Goals (current goals can now be found in the care plan section) Acute Rehab PT Goals Patient Stated Goal: to go home PT Goal Formulation: With patient/family Time For Goal Achievement: 02/15/17 Potential to Achieve Goals: Good    Frequency    Min 3X/week  PT Plan Current plan remains appropriate    Co-evaluation             End of Session   Activity Tolerance: Patient tolerated treatment well Patient left: in bed;with call bell/phone within reach;with bed alarm set;with family/visitor present Nurse Communication: Mobility status PT Visit Diagnosis: Unsteadiness on feet (R26.81);Dizziness and giddiness (R42);Pain;History of falling (Z91.81)     Time: 2591-0289 PT Time Calculation (min) (ACUTE ONLY): 11 min  Charges:  $Gait Training: 8-22 mins                     G CodesKenyon Ana, PT Pager: [phone] 02/09/2017    Kenyon Ana 02/09/2017, 3:12 PM

## 2017-02-09 NOTE — Progress Notes (Signed)
Discharge instructions reviewed with patient and husband utilizing teach back method. Patient discharged to home

## 2017-02-09 NOTE — Care Management Note (Signed)
Case Management Note  Patient Details  Name: [name] MRN: [id number] Date of Birth: 1941-05-11  Subjective/Objective:    Fall with isolated T11 compression fracture,                 Action/Plan: Discharge Planning: AVS reviewed:  NCM spoke to pt, husband and son at bedside. Offered choice for HH/list provided. Pt agreeable to [facility] for [facility] PT/OT. Contacted [facility] DME rep for RW for home. Pt states she has shower chair. Pt reports she has a [place] that may assist with paying for aide at home.   PCP Emi Belfast D MD  Expected Discharge Date:  02/09/17               Expected Discharge Plan:  [place]  In-House Referral:  NA  Discharge planning Services  CM Consult  Post Acute Care Choice:  Home Health Choice offered to:  Patient  DME Arranged:  Walker rolling DME Agency:  [place]:  PT, OT [place] Agency:  [facility] (formerly [facility])  Status of Service:  Completed, signed off  If discussed at [place] of Stay Meetings, dates discussed:    Additional Comments:  Erenest Rasher, RN 02/09/2017, 3:16 PM

## 2017-02-10 DIAGNOSIS — S22080A Wedge compression fracture of T11-T12 vertebra, initial encounter for closed fracture: Secondary | ICD-10-CM | POA: Diagnosis not present

## 2017-02-11 DIAGNOSIS — S22000A Wedge compression fracture of unspecified thoracic vertebra, initial encounter for closed fracture: Secondary | ICD-10-CM | POA: Diagnosis not present

## 2017-02-11 DIAGNOSIS — N200 Calculus of kidney: Secondary | ICD-10-CM | POA: Diagnosis not present

## 2017-02-11 DIAGNOSIS — S22080D Wedge compression fracture of T11-T12 vertebra, subsequent encounter for fracture with routine healing: Secondary | ICD-10-CM | POA: Diagnosis not present

## 2017-02-11 DIAGNOSIS — K5904 Chronic idiopathic constipation: Secondary | ICD-10-CM | POA: Diagnosis not present

## 2017-02-11 DIAGNOSIS — W19XXXD Unspecified fall, subsequent encounter: Secondary | ICD-10-CM | POA: Diagnosis not present

## 2017-02-11 DIAGNOSIS — I10 Essential (primary) hypertension: Secondary | ICD-10-CM | POA: Diagnosis not present

## 2017-02-11 DIAGNOSIS — I951 Orthostatic hypotension: Secondary | ICD-10-CM | POA: Diagnosis not present

## 2017-02-11 DIAGNOSIS — I4891 Unspecified atrial fibrillation: Secondary | ICD-10-CM | POA: Diagnosis not present

## 2017-02-11 DIAGNOSIS — M810 Age-related osteoporosis without current pathological fracture: Secondary | ICD-10-CM | POA: Diagnosis not present

## 2017-02-15 ENCOUNTER — Other Ambulatory Visit: Payer: Self-pay | Admitting: Gastroenterology

## 2017-02-17 ENCOUNTER — Telehealth: Payer: Self-pay | Admitting: *Deleted

## 2017-02-17 DIAGNOSIS — R55 Syncope and collapse: Secondary | ICD-10-CM

## 2017-02-17 NOTE — Telephone Encounter (Signed)
Pt scheduled for holter tomorrow and Dr End 02/21/17, pt is aware of these appointment times.

## 2017-02-17 NOTE — Telephone Encounter (Signed)
DOD call placed to Dr Meda Coffee in regards to this pt experiencing syncopal episodes, which caused a fracture in her spine.  Per the pts PCP, he spoke with Dr Meda Coffee about this pt, and recommendations were provided by Dr Meda Coffee on this pt.  Per Dr Meda Coffee, this is former Dr Aundra Dubin pt, and will need to re-establish care with another Cardiologist in our office.  Per Dr Meda Coffee, this pt needs to be seen sometime this week, and also have a 48 hour holter monitor placed, for syncope, with complicated fractures.  Order placed and pt will be scheduled to see Dr End this Friday.  Will send [facility] a message to call the pt to arrange 48 hour holter monitor, and Dr Darnelle Bos RN will call the pt to endorse new pt appt for this Friday 4/6.

## 2017-02-18 ENCOUNTER — Ambulatory Visit (INDEPENDENT_AMBULATORY_CARE_PROVIDER_SITE_OTHER): Payer: Medicare Other

## 2017-02-18 DIAGNOSIS — R350 Frequency of micturition: Secondary | ICD-10-CM | POA: Diagnosis not present

## 2017-02-18 DIAGNOSIS — K5901 Slow transit constipation: Secondary | ICD-10-CM | POA: Diagnosis not present

## 2017-02-18 DIAGNOSIS — R55 Syncope and collapse: Secondary | ICD-10-CM

## 2017-02-18 DIAGNOSIS — R001 Bradycardia, unspecified: Secondary | ICD-10-CM | POA: Diagnosis not present

## 2017-02-18 DIAGNOSIS — I951 Orthostatic hypotension: Secondary | ICD-10-CM | POA: Diagnosis not present

## 2017-02-18 DIAGNOSIS — M4854XA Collapsed vertebra, not elsewhere classified, thoracic region, initial encounter for fracture: Secondary | ICD-10-CM | POA: Diagnosis not present

## 2017-02-18 DIAGNOSIS — M810 Age-related osteoporosis without current pathological fracture: Secondary | ICD-10-CM | POA: Diagnosis not present

## 2017-02-20 ENCOUNTER — Encounter: Payer: Self-pay | Admitting: Internal Medicine

## 2017-02-21 ENCOUNTER — Ambulatory Visit (INDEPENDENT_AMBULATORY_CARE_PROVIDER_SITE_OTHER): Payer: Medicare Other | Admitting: Internal Medicine

## 2017-02-21 ENCOUNTER — Encounter: Payer: Self-pay | Admitting: Internal Medicine

## 2017-02-21 VITALS — BP 192/88 | HR 56 | Ht 63.0 in | Wt 112.6 lb

## 2017-02-21 DIAGNOSIS — I951 Orthostatic hypotension: Secondary | ICD-10-CM

## 2017-02-21 DIAGNOSIS — I2584 Coronary atherosclerosis due to calcified coronary lesion: Secondary | ICD-10-CM

## 2017-02-21 DIAGNOSIS — I10 Essential (primary) hypertension: Secondary | ICD-10-CM

## 2017-02-21 DIAGNOSIS — I2510 Atherosclerotic heart disease of native coronary artery without angina pectoris: Secondary | ICD-10-CM | POA: Diagnosis not present

## 2017-02-21 DIAGNOSIS — R001 Bradycardia, unspecified: Secondary | ICD-10-CM | POA: Diagnosis not present

## 2017-02-21 DIAGNOSIS — R0602 Shortness of breath: Secondary | ICD-10-CM

## 2017-02-21 NOTE — Progress Notes (Signed)
02/21/2017 [name]   01/06/1941  [id number]  Primary Physician Teresa Pillar, MD Primary Cardiologist: Dr. Saunders Stein   Reason for Visit/CC: orthostatic hypotension and bradycardia  HPI:  The patient is a 76 year old female with history of labile hypertension, occasional orthostatic type symptoms, hyperlipidemia, and paroxysmal atrial fibrillation, who presents for follow-up of orthostatic hypotension and bradycardia. The patient was previously followed by Dr. Aundra Stein and was last seen in our office by [name] in 11/2016. At that time, she was noted to have sinus bradycardia on EKG. Preceding chest CT was also notable for coronary artery calcification. Event monitor and myocardial perfusion stress test were ordered, though the patient did not proceed with either due to upcoming trip and health issues with her husband.  The patient suffered a mechanical fall on 02/07/17, after tripping in the kitchen, resulting in a compression fracture of T12. She was noted to have significant orthostatic hypotension for which she received IV fluids. Since leaving the hospital, [name] has not had any further falls. She notes some orthostatic lightheadedness and an "unwell" feeling when her blood pressure drops below 633 mmHg systolic. She typically feels best when her SBP is around 140-145 mmHg. She denies chest pain, palpitations, leg edema, and orthopnea. She has chronic shortness of breath (non-exertional) that has been stable for years. It should be noted that progressive exertional dyspnea was described in prior office note. She reports being compliant with her medications. She drinks 4-5 glasses of water throughout the day. The patient wore a 48-hour Holter monitor after her recent hospitalization; it was returned yesterday.  Current Meds  Medication Sig  . aspirin 81 MG tablet Take 81 mg by mouth daily.    [name] atorvastatin (LIPITOR) 20 MG tablet TAKE 1 TABLET EACH DAY. (Patient taking differently:  Take 20 mg by mouth daily. )  . cholecalciferol (VITAMIN D) 1000 UNITS tablet Take 1,000 Units by mouth daily.  [name] ibuprofen (ADVIL,MOTRIN) 600 MG tablet Take 1 tablet (600 mg total) by mouth every 6 (six) hours as needed for moderate pain.  [name] losartan (COZAAR) 25 MG tablet Take 1 tablet (25 mg total) by mouth daily.  [name] oxybutynin (DITROPAN-XL) 5 MG 24 hr tablet Take 5 mg by mouth every other day.   . polyethylene glycol powder (GLYCOLAX/MIRALAX) powder DISSOLVE ONE CAPFUL IN 8 0Z. FLUID ONCE DAILY.  [name] senna (SENOKOT) 8.6 MG TABS tablet Take 1 tablet (8.6 mg total) by mouth 2 (two) times daily.   Allergies  Allergen Reactions  . Anesthetics, Amide Other (See Comments)    Pt states she had hallucinations and HTN after procedure 01/19/16-she feels may have been reaction to anesthetic  . Lisinopril Cough   Past Medical History:  Diagnosis Date  . Abdominal pain, unspecified site   . ADVERSE REACTION TO MEDICATION 07/31/2009  . ANEMIA-NOS 07/07/2007  . Arthritis   . Chest pain, unspecified   . Chronic kidney disease    hx of kidney stone  . COLONIC POLYPS, ADENOMATOUS, HX OF   . Disturbance of skin sensation   . DIVERTICULOSIS, COLON 02/23/2009  . Family history of diabetes mellitus   . Family history of malignant neoplasm of breast   . FATIGUE 05/25/2009  . Fever, unspecified   . Heart murmur    hx of  . HYPERLIPIDEMIA 07/07/2007  . HYPERPARATHYROIDISM UNSPECIFIED 10/20/2007  . HYPERTENSION 02/04/2008  . NEPHROLITHIASIS, HX OF 11/15/2008  . OSTEOPOROSIS 01/01/2008  . Other acquired absence of organ    parathyroidectomy  .  Pain in limb   . PALPITATIONS, OCCASIONAL 07/12/2008  . PARATHYROIDECTOMY 01/02/2009  . SCOLIOSIS 05/25/2009  . Seasonal allergies   . Unspecified constipation   . URINARY INCONTINENCE 07/07/2007  . UTI'S, RECURRENT 11/20/2009   kimbrough    Family History  Problem Relation Age of Onset  . Sudden death Father 51  . Heart disease Father 57    MI  . Heart disease Mother      valve replacement  . Breast cancer Other     1st degree relative <50  . Diabetes      1st degree relative  . Colon cancer Neg Hx   . Stomach cancer Neg Hx    Past Surgical History:  Procedure Laterality Date  . BUNIONECTOMY    . COLONOSCOPY    . LAPAROSCOPIC APPENDECTOMY  01/19/2016   Procedure: APPENDECTOMY LAPAROSCOPIC with orifice of cecal polyp;  Surgeon: Leighton Ruff, MD;  Location: [facility];  Service: General;;  . [place] open neck exploration  . POLYPECTOMY    . RIGHT OOPHORECTOMY    . TONSILLECTOMY     Social History   Social History  . Marital status: Married    Spouse name: N/A  . Number of children: 3  . Years of education: N/A   Occupational History  . travel agent Retired   Social History Main Topics  . Smoking status: Never Smoker  . Smokeless tobacco: Never Used  . Alcohol use 3.0 oz/week    5 Glasses of wine per week     Comment: occ. wine  . Drug use: No  . Sexual activity: No   Other Topics Concern  . Not on file   Social History Narrative   HH of 5    Kids at home now   Married   travel agent.    2-3 c coffee in am    ocass wine    G3P2vaginal delivery   Pt cell [phone]              Review of Systems: A 12-system review of systems was performed and notable for intermittent lower thoracic back pain following T12 fracture, as well as dizziness and gait instability. She also notices some vision problems, for which she follows with ophthalmology. ROS otherwise negative.  Physical Exam:  Blood pressure (!) 192/88, pulse (!) 56, height 5\' 3"  (1.6 m), weight 112 lb 9.6 oz (51.1 kg), SpO2 98 %.  Position Blood pressure (mmHg) Heart rate (bpm)  Lying 192/86 54  Sitting 159/82 57  Standing 123/76 62  Standing (3 minutes) 105/70 65  Ambulatory heart rate response: 64 bpm -> 82 bpm  Gen: Thin, elderly woman seated comfortably in the exam room. She is accompanied by her husband and son. HEENT: No conjunctival  pallor or scleral icterus. MMM. OP clear. Neck:Supple without LAD, TM, JVD, or HJR. Resp: CTA bilaterally with normal work of breathing. CV: RRR without m/r/g. Non-displaced PMI. Abd: Soft, NT/ND without HSM. Ext: 2+ radial, PT, and DP pulses. No LE edema. Neuro: CN grossly intact. Ambulates with rolling walker with mild lower thoracic pain.  EKG Sinus bradycardia (HR 56 bpm). Otherwise, no significant abnormalities.    Chemistry      Component Value Date/Time   NA 142 02/09/2017 0404   K 3.9 02/09/2017 0404   CL 112 (H) 02/09/2017 0404   CO2 25 02/09/2017 0404   BUN 11 02/09/2017 0404   CREATININE 0.53 02/09/2017 0404   CREATININE  0.64 01/25/2015 1112      Component Value Date/Time   CALCIUM 8.0 (L) 02/09/2017 0404   CALCIUM 9.2 09/07/2010 2148   ALKPHOS 57 02/07/2017 1822   AST 28 02/07/2017 1822   ALT 15 02/07/2017 1822   BILITOT 2.8 (H) 02/07/2017 1822     Lab Results  Component Value Date   WBC 6.0 02/09/2017   HGB 12.0 02/09/2017   HCT 35.9 (L) 02/09/2017   MCV 91.6 02/09/2017   PLT 162 02/09/2017   Lab Results  Component Value Date   TSH 2.808 02/09/2017   AM Cortisol (02/09/17)L 7.4  ASSESSMENT AND PLAN:  Orthostatic hypotension Per prior notes, this has been a previous problem. She notes some fatigue and lightheadedness with her low blood pressures when standing, though it does not sound like her recent fall was related to hypotension. I have counseled the patient and her family about the importance of staying well-hydrated. She should drink ~ 64 oz of water per day and wear waist high compression stockings as well as tight clothing. I am hesitant to have her increase salt intake or add fludrocortisone at this time, given her significant hypertension when recumbent. We will continue current dose of losartan.  Shortness of breath and coronary artery calcification Dyspnea appears stable and is not clear exertional. Given coronary artery calcifications and  previous exertional component to her shortness of breath, we have agreed to proceed with pharmacologic myocardial perfusion stress test. Echo last year showed normal LVEF with mild MR and moderate pulmonary hypertension.  Sinus bradycardia Patient demonstrates appropriate HR response with ambulation in the hall today (64->82 bpm). No significant EKG abnormalities other than sinus brady with HR 56 bpm. Cortisol and TSH normal last month. Will f/u results of recent event monitor.  Hypertension Continue losartan. Given significant orthostatic hypotension, we will tolerate a degree of permissive hypertension. Patient was instructed to monitor her BP at home and to contact us if it is consisently above 180/100 when seated.  Follow-up: Return to clinic in 1 month.  Nelva Bush, MD 02/21/2017 1:13 PM

## 2017-02-21 NOTE — Patient Instructions (Signed)
Medication Instructions:  Your physician recommends that you continue on your current medications as directed. Please refer to the Current Medication list given to you today.   Labwork: None   Testing/Procedures: Your physician has requested that you have a lexiscan myoview. For further information please visit [email or web address]. Please follow instruction sheet, as given.    Follow-Up: Your physician recommends that you schedule a follow-up appointment in: 1 month with Dr End.   Any Other Special Instructions Will Be Listed Below (If Applicable).  Dr End recommends that you drink at least 64 oz of water per day.  Use compression pantyhose --put them on in the morning and take them off at night.    If you need a refill on your cardiac medications before your next appointment, please call your pharmacy.

## 2017-02-26 DIAGNOSIS — S22080D Wedge compression fracture of T11-T12 vertebra, subsequent encounter for fracture with routine healing: Secondary | ICD-10-CM | POA: Diagnosis not present

## 2017-02-27 DIAGNOSIS — M4854XA Collapsed vertebra, not elsewhere classified, thoracic region, initial encounter for fracture: Secondary | ICD-10-CM | POA: Diagnosis not present

## 2017-02-27 DIAGNOSIS — N390 Urinary tract infection, site not specified: Secondary | ICD-10-CM | POA: Diagnosis not present

## 2017-02-27 DIAGNOSIS — I951 Orthostatic hypotension: Secondary | ICD-10-CM | POA: Diagnosis not present

## 2017-03-19 DIAGNOSIS — M810 Age-related osteoporosis without current pathological fracture: Secondary | ICD-10-CM | POA: Diagnosis not present

## 2017-03-19 DIAGNOSIS — M4854XA Collapsed vertebra, not elsewhere classified, thoracic region, initial encounter for fracture: Secondary | ICD-10-CM | POA: Diagnosis not present

## 2017-03-19 DIAGNOSIS — R768 Other specified abnormal immunological findings in serum: Secondary | ICD-10-CM | POA: Diagnosis not present

## 2017-03-19 DIAGNOSIS — Z6821 Body mass index (BMI) 21.0-21.9, adult: Secondary | ICD-10-CM | POA: Diagnosis not present

## 2017-03-20 ENCOUNTER — Telehealth (HOSPITAL_COMMUNITY): Payer: Self-pay | Admitting: *Deleted

## 2017-03-20 NOTE — Telephone Encounter (Signed)
Patient given detailed instructions per Myocardial Perfusion Study Information Sheet for the test on 03/24/17. Patient notified to arrive 15 minutes early and that it is imperative to arrive on time for appointment to keep from having the test rescheduled.  If you need to cancel or reschedule your appointment, please call the office within 24 hours of your appointment. Failure to do so may result in a cancellation of your appointment, and a $50 no show fee. Patient verbalized understanding. Kirstie Peri

## 2017-03-21 ENCOUNTER — Other Ambulatory Visit: Payer: Self-pay | Admitting: Cardiology

## 2017-03-24 ENCOUNTER — Ambulatory Visit (HOSPITAL_COMMUNITY): Payer: Medicare Other | Attending: Cardiology

## 2017-03-24 DIAGNOSIS — I510 Cardiac septal defect, acquired: Secondary | ICD-10-CM | POA: Diagnosis not present

## 2017-03-24 DIAGNOSIS — I951 Orthostatic hypotension: Secondary | ICD-10-CM | POA: Diagnosis not present

## 2017-03-24 DIAGNOSIS — I2584 Coronary atherosclerosis due to calcified coronary lesion: Secondary | ICD-10-CM

## 2017-03-24 DIAGNOSIS — I2510 Atherosclerotic heart disease of native coronary artery without angina pectoris: Secondary | ICD-10-CM | POA: Diagnosis not present

## 2017-03-24 DIAGNOSIS — R9439 Abnormal result of other cardiovascular function study: Secondary | ICD-10-CM | POA: Diagnosis not present

## 2017-03-24 LAB — MYOCARDIAL PERFUSION IMAGING
LV dias vol: 72 mL (ref 46–106)
LV sys vol: 24 mL
Peak HR: 78 {beats}/min
RATE: 0.35
Rest HR: 59 {beats}/min
SDS: 5
SRS: 4
SSS: 9
TID: 0.94

## 2017-03-24 MED ORDER — TECHNETIUM TC 99M TETROFOSMIN IV KIT
32.1000 | PACK | Freq: Once | INTRAVENOUS | Status: AC | PRN
  Administered 2017-03-24: 32.1 via INTRAVENOUS
  Filled 2017-03-24: qty 33

## 2017-03-24 MED ORDER — REGADENOSON 0.4 MG/5ML IV SOLN
0.4000 mg | Freq: Once | INTRAVENOUS | Status: AC
  Administered 2017-03-24: 0.4 mg via INTRAVENOUS

## 2017-03-24 MED ORDER — TECHNETIUM TC 99M TETROFOSMIN IV KIT
11.0000 | PACK | Freq: Once | INTRAVENOUS | Status: AC | PRN
  Administered 2017-03-24: 11 via INTRAVENOUS
  Filled 2017-03-24: qty 11

## 2017-03-25 ENCOUNTER — Other Ambulatory Visit: Payer: Self-pay | Admitting: Cardiology

## 2017-03-26 NOTE — Telephone Encounter (Signed)
REFILL 

## 2017-03-27 ENCOUNTER — Encounter (INDEPENDENT_AMBULATORY_CARE_PROVIDER_SITE_OTHER): Payer: Self-pay

## 2017-03-27 ENCOUNTER — Encounter: Payer: Self-pay | Admitting: Internal Medicine

## 2017-03-27 ENCOUNTER — Ambulatory Visit (INDEPENDENT_AMBULATORY_CARE_PROVIDER_SITE_OTHER): Payer: Medicare Other | Admitting: Internal Medicine

## 2017-03-27 VITALS — BP 120/78 | HR 68 | Ht 63.0 in | Wt 113.8 lb

## 2017-03-27 DIAGNOSIS — R0789 Other chest pain: Secondary | ICD-10-CM

## 2017-03-27 DIAGNOSIS — I951 Orthostatic hypotension: Secondary | ICD-10-CM

## 2017-03-27 DIAGNOSIS — R001 Bradycardia, unspecified: Secondary | ICD-10-CM

## 2017-03-27 DIAGNOSIS — I10 Essential (primary) hypertension: Secondary | ICD-10-CM | POA: Diagnosis not present

## 2017-03-27 DIAGNOSIS — R9439 Abnormal result of other cardiovascular function study: Secondary | ICD-10-CM

## 2017-03-27 MED ORDER — NITROGLYCERIN 0.4 MG SL SUBL: 0.4000 mg | SUBLINGUAL_TABLET | SUBLINGUAL | 6 refills | Status: DC | PRN

## 2017-03-27 NOTE — Progress Notes (Signed)
Follow-up Outpatient Visit Date: 03/27/2017  Primary Care Provider: Lanice Shirts, MD [address] [place] [address]  Chief Complaint: Follow-up orthostatic hypotension and bradycardia  HPI:  [name] is a 76 y.o. year-old female with history of labile hypertension, occasional orthostatic type symptoms, hyperlipidemia, and paroxysmal atrial fibrillation, who presents for follow-up of orthostatic hypotension and bradycardia. I last saw her one month ago, which time [name] continued to report labile blood pressures with occasional lightheadedness. This has persisted since our last visit, not significantly changed. Today her blood pressure is normal, though yesterday she reports it was up to 203/95. When her systolic pressure drops below 110, she feels lightheaded. She has not had any further chest pain or shortness of breath since her last visit, though she has been relatively sedentary for the last few months following her compression fracture. She is due to follow-up with orthopedics tomorrow. She is not had any orthopnea, PND, or edema.  --------------------------------------------------------------------------------------------------  Cardiovascular History & Procedures:  Recent CV Pertinent Labs: Lab Results  Component Value Date   CHOL 132 01/25/2015   HDL 68 01/25/2015   LDLCALC 51 01/25/2015   LDLDIRECT 156.3 03/28/2011   TRIG 64 01/25/2015   CHOLHDL 1.9 01/25/2015   INR 0.9 09/23/2007   K 3.9 02/09/2017   BUN 11 02/09/2017   CREATININE 0.53 02/09/2017   CREATININE 0.64 01/25/2015    Past Medical History:  Diagnosis Date  . Abdominal pain, unspecified site   . ADVERSE REACTION TO MEDICATION 07/31/2009  . ANEMIA-NOS 07/07/2007  . Arthritis   . Chest pain, unspecified   . Chronic kidney disease    hx of kidney stone  . COLONIC POLYPS, ADENOMATOUS, HX OF   . Disturbance of skin sensation   . DIVERTICULOSIS, COLON 02/23/2009  . Family history  of diabetes mellitus   . Family history of malignant neoplasm of breast   . FATIGUE 05/25/2009  . Fever, unspecified   . Heart murmur    hx of  . HYPERLIPIDEMIA 07/07/2007  . HYPERPARATHYROIDISM UNSPECIFIED 10/20/2007  . HYPERTENSION 02/04/2008  . NEPHROLITHIASIS, HX OF 11/15/2008  . OSTEOPOROSIS 01/01/2008  . Other acquired absence of organ    parathyroidectomy  . Pain in limb   . PALPITATIONS, OCCASIONAL 07/12/2008  . PARATHYROIDECTOMY 01/02/2009  . SCOLIOSIS 05/25/2009  . Seasonal allergies   . Unspecified constipation   . URINARY INCONTINENCE 07/07/2007  . UTI'S, RECURRENT 11/20/2009   kimbrough    Past Surgical History:  Procedure Laterality Date  . BUNIONECTOMY    . COLONOSCOPY    . LAPAROSCOPIC APPENDECTOMY  01/19/2016   Procedure: APPENDECTOMY LAPAROSCOPIC with orifice of cecal polyp;  Surgeon: Leighton Ruff, MD;  Location: [facility];  Service: General;;  . [place] open neck exploration  . POLYPECTOMY    . RIGHT OOPHORECTOMY    . TONSILLECTOMY      Outpatient Encounter Prescriptions as of 03/27/2017  Medication Sig  . aspirin 81 MG tablet Take 81 mg by mouth daily.    [name] atorvastatin (LIPITOR) 20 MG tablet TAKE 1 TABLET EACH DAY.  . cholecalciferol (VITAMIN D) 1000 UNITS tablet Take 1,000 Units by mouth daily.  [name] ibuprofen (ADVIL,MOTRIN) 600 MG tablet Take 1 tablet (600 mg total) by mouth every 6 (six) hours as needed for moderate pain.  [name] losartan (COZAAR) 25 MG tablet TAKE 1 TABLET ONCE DAILY.  [name] polyethylene glycol powder (GLYCOLAX/MIRALAX) powder DISSOLVE ONE CAPFUL IN 8 0Z.  FLUID ONCE DAILY.  [name] senna (SENOKOT) 8.6 MG TABS tablet Take 1 tablet (8.6 mg total) by mouth 2 (two) times daily.  . [DISCONTINUED] oxybutynin (DITROPAN-XL) 5 MG 24 hr tablet Take 5 mg by mouth every other day.   . nitroGLYCERIN (NITROSTAT) 0.4 MG SL tablet Place 1 tablet (0.4 mg total) under the tongue every 5 (five) minutes as needed for chest pain.   No facility-administered  encounter medications on file as of 03/27/2017.     Allergies: Anesthetics, amide and Lisinopril  Social History   Social History  . Marital status: Married    Spouse name: N/A  . Number of children: 3  . Years of education: N/A   Occupational History  . travel agent Retired   Social History Main Topics  . Smoking status: Never Smoker  . Smokeless tobacco: Never Used  . Alcohol use 4.2 oz/week    7 Glasses of wine per week     Comment: last drink 3 weeks ago  . Drug use: No  . Sexual activity: No   Other Topics Concern  . Not on file   Social History Narrative   HH of 5    Kids at home now   Married   travel agent.    2-3 c coffee in am    ocass wine    G3P2vaginal delivery   Pt cell [phone]             Family History  Problem Relation Age of Onset  . Sudden death Father 10  . Heart disease Father 101       MI  . Heart disease Mother        valve replacement  . Breast cancer Other        1st degree relative <50  . Diabetes Unknown        1st degree relative  . Colon cancer Neg Hx   . Stomach cancer Neg Hx     Review of Systems: A 12-system review of systems was performed and was negative except as noted in the HPI.  --------------------------------------------------------------------------------------------------  Physical Exam: BP 120/78 (BP Location: Left Arm, Patient Position: Sitting, Cuff Size: Normal)   Pulse 68   Ht 5\' 3"  (1.6 m)   Wt 113 lb 12.8 oz (51.6 kg)   SpO2 98%   BMI 20.16 kg/m   General:  Thin, elderly woman seated comfortably in the exam room. She is accompanied by her husband. HEENT: No conjunctival pallor or scleral icterus.  Moist mucous membranes.  OP clear. Neck: Supple without lymphadenopathy, thyromegaly, JVD, or HJR. Lungs: Normal work of breathing.  Clear to auscultation bilaterally without wheezes or crackles. Heart: Regular rate and rhythm without murmurs, rubs, or gallops.  Non-displaced PMI. Abd: Bowel sounds  present.  Soft, NT/ND without hepatosplenomegaly Ext: No lower extremity edema.  Radial, PT, and DP pulses are 2+ bilaterally. Skin: warm and dry without rash  Pharmacologic myocardial perfusion stress test (03/24/17): High risk study with small in size, mild in severity, basal inferoseptal and mid inferoseptal defects that could reflect diaphragmatic attenuation and/or ischemia. Is also a small in size, moderate in severity, partially reversible apical defect. Moderate in size, moderate in severity, basal anteroseptal, mid anteroseptal, and apical septal defect is reversible and concerning for ischemia. LVEF 67%.  Holter monitor (02/18/17): Predominantly sinus rhythm with rare PACs and PVCs. No significant arrhythmias.  Lab Results  Component Value Date   WBC 6.0 02/09/2017   HGB 12.0 02/09/2017  HCT 35.9 (L) 02/09/2017   MCV 91.6 02/09/2017   PLT 162 02/09/2017    Lab Results  Component Value Date   NA 142 02/09/2017   K 3.9 02/09/2017   CL 112 (H) 02/09/2017   CO2 25 02/09/2017   BUN 11 02/09/2017   CREATININE 0.53 02/09/2017   GLUCOSE 90 02/09/2017   ALT 15 02/07/2017    Lab Results  Component Value Date   CHOL 132 01/25/2015   HDL 68 01/25/2015   LDLCALC 51 01/25/2015   LDLDIRECT 156.3 03/28/2011   TRIG 64 01/25/2015   CHOLHDL 1.9 01/25/2015    --------------------------------------------------------------------------------------------------  ASSESSMENT AND PLAN: Orthostatic hypotension Patient's blood pressure remains labile, though normal on assessment today. Etiology has not yet been found. Given high risk stress test, I am concerned that CAD may be contributing. We have discussed proceeding with left heart catheterization, which the patient is in agreement with. If this is normal, we could consider renal artery Doppler in the futurel. I have reviewed the risks, indications, and alternatives to cardiac catheterization, possible angioplasty, and stenting with the  patient. Risks include but are not limited to bleeding, infection, vascular injury, stroke, myocardial infection, arrhythmia, kidney injury, radiation-related injury in the case of prolonged fluoroscopy use, emergency cardiac surgery, and death. The patient understands the risks of serious complication is 1-2 in 1572 with diagnostic cardiac cath and 1-2% or less with angioplasty/stenting. No medication changes today.  Atypical chest pain and abnormal stress test No symptoms since last visit, the patient has been relatively sedentary. In light of high risk stress test, I have recommended left heart catheterization. The patient initially wished to defer this until she follows up with her PCP next month, she has now asked that we proceed before then. We will obtain routine precatheterization labs anticipation of LHC later this month.  Sinus bradycardia Holter monitor shows reasonable heart rate response. Her heart rate today is normal. We will not make any medication changes at this time.  Hypertension Blood pressure normal today though it remains labile at home. Given history of orthostatic lightheadedness, we will defer medication changes today pending left heart catheterization.  Follow-up: Return to clinic in early June.  Nelva Bush, MD 03/29/2017 6:22 PM

## 2017-03-27 NOTE — Patient Instructions (Signed)
Medication Instructions:  Your physician recommends that you continue on your current medications as directed. Please refer to the Current Medication list given to you today.  Use nitroglycerin as needed for chest pain  Labwork: None   Testing/Procedures: None   Follow-Up: Your physician recommends that you schedule a follow-up appointment with Dr End or APP as soon after June 1 as possible.   Any Other Special Instructions Will Be Listed Below (If Applicable).       Nitroglycerin sublingual tablets What is this medicine? NITROGLYCERIN (nye troe GLI ser in) is a type of vasodilator. It relaxes blood vessels, increasing the blood and oxygen supply to your heart. This medicine is used to relieve chest pain caused by angina. It is also used to prevent chest pain before activities like climbing stairs, going outdoors in cold weather, or sexual activity. This medicine may be used for other purposes; ask your health care provider or pharmacist if you have questions. COMMON BRAND NAME(S): Nitroquick, Nitrostat, Nitrotab What should I tell my health care provider before I take this medicine? They need to know if you have any of these conditions: -anemia -head injury, recent stroke, or bleeding in the brain -liver disease -previous heart attack -an unusual or allergic reaction to nitroglycerin, other medicines, foods, dyes, or preservatives -pregnant or trying to get pregnant -breast-feeding How should I use this medicine? Take this medicine by mouth as needed. At the first sign of an angina attack (chest pain or tightness) place one tablet under your tongue. You can also take this medicine 5 to 10 minutes before an event likely to produce chest pain. Follow the directions on the prescription label. Let the tablet dissolve under the tongue. Do not swallow whole. Replace the dose if you accidentally swallow it. It will help if your mouth is not dry. Saliva around the tablet will help it to  dissolve more quickly. Do not eat or drink, smoke or chew tobacco while a tablet is dissolving. If you are not better within 5 minutes after taking ONE dose of nitroglycerin, call 9-1-1 immediately to seek emergency medical care. Do not take more than 3 nitroglycerin tablets over 15 minutes. If you take this medicine often to relieve symptoms of angina, your doctor or health care professional may provide you with different instructions to manage your symptoms. If symptoms do not go away after following these instructions, it is important to call 9-1-1 immediately. Do not take more than 3 nitroglycerin tablets over 15 minutes. Talk to your pediatrician regarding the use of this medicine in children. Special care may be needed. Overdosage: If you think you have taken too much of this medicine contact a poison control center or emergency room at once. NOTE: This medicine is only for you. Do not share this medicine with others. What if I miss a dose? This does not apply. This medicine is only used as needed. What may interact with this medicine? Do not take this medicine with any of the following medications: -certain migraine medicines like ergotamine and dihydroergotamine (DHE) -medicines used to treat erectile dysfunction like sildenafil, tadalafil, and vardenafil -riociguat This medicine may also interact with the following medications: -alteplase -aspirin -heparin -medicines for high blood pressure -medicines for mental depression -other medicines used to treat angina -phenothiazines like chlorpromazine, mesoridazine, prochlorperazine, thioridazine This list may not describe all possible interactions. Give your health care provider a list of all the medicines, herbs, non-prescription drugs, or dietary supplements you use. Also tell them if you  smoke, drink alcohol, or use illegal drugs. Some items may interact with your medicine. What should I watch for while using this medicine? Tell your  doctor or health care professional if you feel your medicine is no longer working. Keep this medicine with you at all times. Sit or lie down when you take your medicine to prevent falling if you feel dizzy or faint after using it. Try to remain calm. This will help you to feel better faster. If you feel dizzy, take several deep breaths and lie down with your feet propped up, or bend forward with your head resting between your knees. You may get drowsy or dizzy. Do not drive, use machinery, or do anything that needs mental alertness until you know how this drug affects you. Do not stand or sit up quickly, especially if you are an older patient. This reduces the risk of dizzy or fainting spells. Alcohol can make you more drowsy and dizzy. Avoid alcoholic drinks. Do not treat yourself for coughs, colds, or pain while you are taking this medicine without asking your doctor or health care professional for advice. Some ingredients may increase your blood pressure. What side effects may I notice from receiving this medicine? Side effects that you should report to your doctor or health care professional as soon as possible: -blurred vision -dry mouth -skin rash -sweating -the feeling of extreme pressure in the head -unusually weak or tired Side effects that usually do not require medical attention (report to your doctor or health care professional if they continue or are bothersome): -flushing of the face or neck -headache -irregular heartbeat, palpitations -nausea, vomiting This list may not describe all possible side effects. Call your doctor for medical advice about side effects. You may report side effects to FDA at 1-800-FDA-1088. Where should I keep my medicine? Keep out of the reach of children. Store at room temperature between 20 and 25 degrees C (68 and 77 degrees F). Store in [facility] of Staff. Protect from light and moisture. Keep tightly closed. Throw away any unused medicine after the  expiration date. NOTE: This sheet is a summary. It may not cover all possible information. If you have questions about this medicine, talk to your doctor, pharmacist, or health care provider.  2018 Elsevier/Gold Standard (2013-09-02 17:57:36)    If you need a refill on your cardiac medications before your next appointment, please call your pharmacy.

## 2017-03-28 ENCOUNTER — Telehealth: Payer: Self-pay | Admitting: Internal Medicine

## 2017-03-28 DIAGNOSIS — R0789 Other chest pain: Secondary | ICD-10-CM

## 2017-03-28 DIAGNOSIS — R9439 Abnormal result of other cardiovascular function study: Secondary | ICD-10-CM

## 2017-03-28 DIAGNOSIS — S22080D Wedge compression fracture of T11-T12 vertebra, subsequent encounter for fracture with routine healing: Secondary | ICD-10-CM | POA: Diagnosis not present

## 2017-03-28 NOTE — Telephone Encounter (Signed)
Pt states she is ready to schedule LHC, pt aware I will follow-up with her on Monday with date and details.

## 2017-03-28 NOTE — Telephone Encounter (Signed)
New Message   pt verbalized that she is calling for rn   She is ready to schedule the Cath

## 2017-03-29 ENCOUNTER — Encounter: Payer: Self-pay | Admitting: Internal Medicine

## 2017-03-31 ENCOUNTER — Encounter: Payer: Self-pay | Admitting: *Deleted

## 2017-03-31 NOTE — Telephone Encounter (Signed)
I spoke with patient and reviewed instructions for cardiac catheterization over the telephone, I have left a written copy at the front desk for pt to pick up when she comes for pre-procedure lab 04/01/17.

## 2017-03-31 NOTE — Telephone Encounter (Signed)
In basket message to prior authorization.

## 2017-03-31 NOTE — Telephone Encounter (Signed)
LHC scheduled for 04/07/17 12:30 PM, arrive 10:30 AM, need to schedule pre-procedure lab in the next day or so.   LMTCB for pt.

## 2017-03-31 NOTE — Telephone Encounter (Signed)
Follow Up   Pt calling back but requests a return call after 10AM

## 2017-04-01 ENCOUNTER — Encounter (INDEPENDENT_AMBULATORY_CARE_PROVIDER_SITE_OTHER): Payer: Self-pay

## 2017-04-01 ENCOUNTER — Other Ambulatory Visit: Payer: Medicare Other | Admitting: *Deleted

## 2017-04-01 DIAGNOSIS — R9439 Abnormal result of other cardiovascular function study: Secondary | ICD-10-CM | POA: Diagnosis not present

## 2017-04-01 DIAGNOSIS — R0789 Other chest pain: Secondary | ICD-10-CM | POA: Diagnosis not present

## 2017-04-01 DIAGNOSIS — M810 Age-related osteoporosis without current pathological fracture: Secondary | ICD-10-CM | POA: Diagnosis not present

## 2017-04-01 DIAGNOSIS — R5383 Other fatigue: Secondary | ICD-10-CM | POA: Diagnosis not present

## 2017-04-01 DIAGNOSIS — E559 Vitamin D deficiency, unspecified: Secondary | ICD-10-CM | POA: Diagnosis not present

## 2017-04-01 LAB — CBC WITH DIFFERENTIAL/PLATELET
Basophils Absolute: 0.1 10*3/uL (ref 0.0–0.2)
Basos: 1 %
EOS (ABSOLUTE): 0.1 10*3/uL (ref 0.0–0.4)
Eos: 3 %
Hematocrit: 39.3 % (ref 34.0–46.6)
Hemoglobin: 12.8 g/dL (ref 11.1–15.9)
Immature Grans (Abs): 0 10*3/uL (ref 0.0–0.1)
Immature Granulocytes: 0 %
Lymphocytes Absolute: 1.4 10*3/uL (ref 0.7–3.1)
Lymphs: 31 %
MCH: 30.5 pg (ref 26.6–33.0)
MCHC: 32.6 g/dL (ref 31.5–35.7)
MCV: 94 fL (ref 79–97)
Monocytes Absolute: 0.5 10*3/uL (ref 0.1–0.9)
Monocytes: 10 %
Neutrophils Absolute: 2.5 10*3/uL (ref 1.4–7.0)
Neutrophils: 55 %
Platelets: 249 10*3/uL (ref 150–379)
RBC: 4.19 x10E6/uL (ref 3.77–5.28)
RDW: 13.8 % (ref 12.3–15.4)
WBC: 4.5 10*3/uL (ref 3.4–10.8)

## 2017-04-01 LAB — BASIC METABOLIC PANEL
BUN/Creatinine Ratio: 21 (ref 12–28)
BUN: 13 mg/dL (ref 8–27)
CO2: 22 mmol/L (ref 18–29)
Calcium: 9 mg/dL (ref 8.7–10.3)
Chloride: 101 mmol/L (ref 96–106)
Creatinine, Ser: 0.63 mg/dL (ref 0.57–1.00)
GFR calc Af Amer: 100 mL/min/{1.73_m2} (ref >59)
GFR calc non Af Amer: 87 mL/min/{1.73_m2} (ref >59)
Glucose: 90 mg/dL (ref 65–99)
Potassium: 4.2 mmol/L (ref 3.5–5.2)
Sodium: 140 mmol/L (ref 134–144)

## 2017-04-01 LAB — PROTIME-INR
INR: 1 (ref 0.8–1.2)
Prothrombin Time: 10.4 s (ref 9.1–12.0)

## 2017-04-03 DIAGNOSIS — M810 Age-related osteoporosis without current pathological fracture: Secondary | ICD-10-CM | POA: Diagnosis not present

## 2017-04-03 DIAGNOSIS — S22080D Wedge compression fracture of T11-T12 vertebra, subsequent encounter for fracture with routine healing: Secondary | ICD-10-CM | POA: Diagnosis not present

## 2017-04-07 ENCOUNTER — Encounter (HOSPITAL_COMMUNITY)
Admission: RE | Disposition: A | Discharge status: Home / Self Care | Payer: Self-pay | Source: Ambulatory Visit | Attending: Internal Medicine

## 2017-04-07 ENCOUNTER — Other Ambulatory Visit: Payer: Self-pay

## 2017-04-07 ENCOUNTER — Ambulatory Visit (HOSPITAL_COMMUNITY)
Admission: RE | Admit: 2017-04-07 | Discharge: 2017-04-07 | Disposition: A | Discharge status: Home / Self Care | Payer: Medicare Other | Source: Ambulatory Visit | Attending: Internal Medicine | Admitting: Internal Medicine

## 2017-04-07 DIAGNOSIS — Z8249 Family history of ischemic heart disease and other diseases of the circulatory system: Secondary | ICD-10-CM | POA: Diagnosis not present

## 2017-04-07 DIAGNOSIS — E785 Hyperlipidemia, unspecified: Secondary | ICD-10-CM | POA: Diagnosis not present

## 2017-04-07 DIAGNOSIS — R0602 Shortness of breath: Secondary | ICD-10-CM | POA: Diagnosis not present

## 2017-04-07 DIAGNOSIS — I480 Paroxysmal atrial fibrillation: Secondary | ICD-10-CM | POA: Insufficient documentation

## 2017-04-07 DIAGNOSIS — I2510 Atherosclerotic heart disease of native coronary artery without angina pectoris: Secondary | ICD-10-CM | POA: Diagnosis not present

## 2017-04-07 DIAGNOSIS — N189 Chronic kidney disease, unspecified: Secondary | ICD-10-CM | POA: Insufficient documentation

## 2017-04-07 DIAGNOSIS — M419 Scoliosis, unspecified: Secondary | ICD-10-CM | POA: Diagnosis not present

## 2017-04-07 DIAGNOSIS — I951 Orthostatic hypotension: Secondary | ICD-10-CM | POA: Insufficient documentation

## 2017-04-07 DIAGNOSIS — I129 Hypertensive chronic kidney disease with stage 1 through stage 4 chronic kidney disease, or unspecified chronic kidney disease: Secondary | ICD-10-CM | POA: Insufficient documentation

## 2017-04-07 DIAGNOSIS — R001 Bradycardia, unspecified: Secondary | ICD-10-CM | POA: Insufficient documentation

## 2017-04-07 DIAGNOSIS — R9439 Abnormal result of other cardiovascular function study: Secondary | ICD-10-CM | POA: Insufficient documentation

## 2017-04-07 DIAGNOSIS — Z7982 Long term (current) use of aspirin: Secondary | ICD-10-CM | POA: Diagnosis not present

## 2017-04-07 DIAGNOSIS — R55 Syncope and collapse: Secondary | ICD-10-CM | POA: Diagnosis present

## 2017-04-07 HISTORY — PX: LEFT HEART CATH AND CORONARY ANGIOGRAPHY: CATH118249

## 2017-04-07 SURGERY — LEFT HEART CATH AND CORONARY ANGIOGRAPHY
Anesthesia: LOCAL

## 2017-04-07 MED ORDER — FENTANYL CITRATE (PF) 100 MCG/2ML IJ SOLN
INTRAMUSCULAR | Status: AC
  Filled 2017-04-07: qty 2

## 2017-04-07 MED ORDER — SODIUM CHLORIDE 0.9 % WEIGHT BASED INFUSION
3.0000 mL/kg/h | INTRAVENOUS | Status: AC
  Administered 2017-04-07: 3 mL/kg/h via INTRAVENOUS

## 2017-04-07 MED ORDER — SODIUM CHLORIDE 0.9% FLUSH: 3.0000 mL | Freq: Two times a day (BID) | INTRAVENOUS | Status: DC

## 2017-04-07 MED ORDER — VERAPAMIL HCL 2.5 MG/ML IV SOLN
INTRAVENOUS | Status: DC | PRN
  Administered 2017-04-07: 10 mL via INTRA_ARTERIAL

## 2017-04-07 MED ORDER — NITROGLYCERIN 1 MG/10 ML FOR IR/CATH LAB
INTRA_ARTERIAL | Status: DC | PRN
  Administered 2017-04-07 (×2): 200 ug via INTRA_ARTERIAL

## 2017-04-07 MED ORDER — HEPARIN SODIUM (PORCINE) 1000 UNIT/ML IJ SOLN
INTRAMUSCULAR | Status: AC
  Filled 2017-04-07: qty 1

## 2017-04-07 MED ORDER — IOPAMIDOL (ISOVUE-370) INJECTION 76%
INTRAVENOUS | Status: DC | PRN
Start: 2017-04-07 — End: 2017-04-07
  Administered 2017-04-07: 50 mL via INTRA_ARTERIAL

## 2017-04-07 MED ORDER — HEPARIN SODIUM (PORCINE) 1000 UNIT/ML IJ SOLN
INTRAMUSCULAR | Status: DC | PRN
  Administered 2017-04-07: 2500 [IU] via INTRAVENOUS

## 2017-04-07 MED ORDER — SODIUM CHLORIDE 0.9 % IV SOLN: 250.0000 mL | INTRAVENOUS | Status: DC | PRN

## 2017-04-07 MED ORDER — MIDAZOLAM HCL 2 MG/2ML IJ SOLN
INTRAMUSCULAR | Status: DC | PRN
  Administered 2017-04-07: 0.5 mg via INTRAVENOUS
  Administered 2017-04-07: 1 mg via INTRAVENOUS

## 2017-04-07 MED ORDER — LIDOCAINE HCL (PF) 1 % IJ SOLN
INTRAMUSCULAR | Status: DC | PRN
  Administered 2017-04-07: 2 mL

## 2017-04-07 MED ORDER — HEPARIN (PORCINE) IN NACL 2-0.9 UNIT/ML-% IJ SOLN
INTRAMUSCULAR | Status: AC | PRN
  Administered 2017-04-07: 1000 mL

## 2017-04-07 MED ORDER — VERAPAMIL HCL 2.5 MG/ML IV SOLN
INTRAVENOUS | Status: AC
  Filled 2017-04-07: qty 2

## 2017-04-07 MED ORDER — MIDAZOLAM HCL 2 MG/2ML IJ SOLN
INTRAMUSCULAR | Status: AC
  Filled 2017-04-07: qty 2

## 2017-04-07 MED ORDER — FENTANYL CITRATE (PF) 100 MCG/2ML IJ SOLN
INTRAMUSCULAR | Status: DC | PRN
Start: 2017-04-07 — End: 2017-04-07
  Administered 2017-04-07 (×2): 25 ug via INTRAVENOUS

## 2017-04-07 MED ORDER — SODIUM CHLORIDE 0.9 % WEIGHT BASED INFUSION: 1.0000 mL/kg/h | INTRAVENOUS | Status: DC

## 2017-04-07 MED ORDER — IOPAMIDOL (ISOVUE-370) INJECTION 76%
INTRAVENOUS | Status: AC
  Filled 2017-04-07: qty 100

## 2017-04-07 MED ORDER — LIDOCAINE HCL 1 % IJ SOLN
INTRAMUSCULAR | Status: AC
  Filled 2017-04-07: qty 20

## 2017-04-07 MED ORDER — HEPARIN (PORCINE) IN NACL 2-0.9 UNIT/ML-% IJ SOLN
INTRAMUSCULAR | Status: AC
  Filled 2017-04-07: qty 1000

## 2017-04-07 MED ORDER — SODIUM CHLORIDE 0.9% FLUSH: 3.0000 mL | INTRAVENOUS | Status: DC | PRN

## 2017-04-07 MED ORDER — ASPIRIN 81 MG PO CHEW
CHEWABLE_TABLET | ORAL | Status: AC
  Administered 2017-04-07: 81 mg via ORAL
  Filled 2017-04-07: qty 1

## 2017-04-07 MED ORDER — ASPIRIN 81 MG PO CHEW
81.0000 mg | CHEWABLE_TABLET | ORAL | Status: AC
  Administered 2017-04-07: 81 mg via ORAL

## 2017-04-07 MED ORDER — SODIUM CHLORIDE 0.9 % IV SOLN: INTRAVENOUS | Status: DC

## 2017-04-07 SURGICAL SUPPLY — 10 items
CATH 5FR JL3.5 JR4 ANG PIG MP (CATHETERS) ×1 IMPLANT
DEVICE RAD COMP TR BAND LRG (VASCULAR PRODUCTS) ×1 IMPLANT
GLIDESHEATH SLEND SS 6F .021 (SHEATH) ×1 IMPLANT
GUIDEWIRE INQWIRE 1.5J.035X260 (WIRE) IMPLANT
INQWIRE 1.5J .035X260CM (WIRE) ×2
KIT HEART LEFT (KITS) ×2 IMPLANT
PACK CARDIAC CATHETERIZATION (CUSTOM PROCEDURE TRAY) ×2 IMPLANT
TRANSDUCER W/STOPCOCK (MISCELLANEOUS) ×2 IMPLANT
TUBING CIL FLEX 10 FLL-RA (TUBING) ×2 IMPLANT
WIRE HI TORQ VERSACORE-J 145CM (WIRE) ×1 IMPLANT

## 2017-04-07 NOTE — Interval H&P Note (Signed)
History and Physical Interval Note:  04/07/2017 1:49 PM  [name]  has presented today for cardiac catheterization, with the diagnosis of shortness of breath and abnormal stress test. The various methods of treatment have been discussed with the patient and family. After consideration of risks, benefits and other options for treatment, the patient has consented to  Procedure(s): Left Heart Cath and Coronary Angiography (N/A) as a surgical intervention .  The patient's history has been reviewed, patient examined, no change in status, stable for surgery.  I have reviewed the patient's chart and labs.  Questions were answered to the patient's satisfaction.    Cath Lab Visit (complete for each Cath Lab visit)  Clinical Evaluation Leading to the Procedure:   ACS: No.  Non-ACS:    Anginal Classification: CCS III  Anti-ischemic medical therapy: No Therapy  Non-Invasive Test Results: High-risk stress test findings: cardiac mortality >3%/year  Prior CABG: No previous CABG  [name]

## 2017-04-07 NOTE — Progress Notes (Signed)
Dr End in and client c/o seeing floaters left eye

## 2017-04-07 NOTE — Progress Notes (Signed)
Postcatheterization Event Note  Date: 04/07/17 Time: 2:47 PM  Upon the patient reaching the recovery area following left heart catheterization this afternoon, she reported floaters involving the left eye. She has had these in the past but did not have them prior to the catheterization. She currently notes only floaters and no other weakness or speech difficulty. Neurologic exam is unremarkable including 5/5 upper and lower extremity strength bilaterally (excluding right hand grip due to TR band in place). Cranial nerves III-XII are intact. Left eye visual field is grossly normal, though the patient feels that the left outer lower quadrant might somewhat truncated. Of note, she is currently awaiting ophthalmology evaluation for problems involving the left eye.  Given no neurologic deficits on exam and potential for vision changes related to sedation and contrast, we will continue to monitor the patient closely. If her floaters do not improve, we may need to consider further neurologic evaluation prior to discharge.  Nelva Bush, MD [facility] Pager: [phone]

## 2017-04-07 NOTE — Progress Notes (Signed)
Client states no longer seeing floaters left eye

## 2017-04-07 NOTE — H&P (View-Only) (Signed)
Follow-up Outpatient Visit Date: 03/27/2017  Primary Care Provider: Lanice Shirts, MD [address] [place] [address]  Chief Complaint: Follow-up orthostatic hypotension and bradycardia  HPI:  Ms. Percle is a 76 y.o. year-old female with history of labile hypertension, occasional orthostatic type symptoms, hyperlipidemia, and paroxysmal atrial fibrillation, who presents for follow-up of orthostatic hypotension and bradycardia. I last saw her one month ago, which time Ms. Litsey continued to report labile blood pressures with occasional lightheadedness. This has persisted since our last visit, not significantly changed. Today her blood pressure is normal, though yesterday she reports it was up to 203/95. When her systolic pressure drops below 110, she feels lightheaded. She has not had any further chest pain or shortness of breath since her last visit, though she has been relatively sedentary for the last few months following her compression fracture. She is due to follow-up with orthopedics tomorrow. She is not had any orthopnea, PND, or edema.  --------------------------------------------------------------------------------------------------  Cardiovascular History & Procedures:  Recent CV Pertinent Labs: Lab Results  Component Value Date   CHOL 132 01/25/2015   HDL 68 01/25/2015   LDLCALC 51 01/25/2015   LDLDIRECT 156.3 03/28/2011   TRIG 64 01/25/2015   CHOLHDL 1.9 01/25/2015   INR 0.9 09/23/2007   K 3.9 02/09/2017   BUN 11 02/09/2017   CREATININE 0.53 02/09/2017   CREATININE 0.64 01/25/2015    Past Medical History:  Diagnosis Date  . Abdominal pain, unspecified site   . ADVERSE REACTION TO MEDICATION 07/31/2009  . ANEMIA-NOS 07/07/2007  . Arthritis   . Chest pain, unspecified   . Chronic kidney disease    hx of kidney stone  . COLONIC POLYPS, ADENOMATOUS, HX OF   . Disturbance of skin sensation   . DIVERTICULOSIS, COLON 02/23/2009  . Family history  of diabetes mellitus   . Family history of malignant neoplasm of breast   . FATIGUE 05/25/2009  . Fever, unspecified   . Heart murmur    hx of  . HYPERLIPIDEMIA 07/07/2007  . HYPERPARATHYROIDISM UNSPECIFIED 10/20/2007  . HYPERTENSION 02/04/2008  . NEPHROLITHIASIS, HX OF 11/15/2008  . OSTEOPOROSIS 01/01/2008  . Other acquired absence of organ    parathyroidectomy  . Pain in limb   . PALPITATIONS, OCCASIONAL 07/12/2008  . PARATHYROIDECTOMY 01/02/2009  . SCOLIOSIS 05/25/2009  . Seasonal allergies   . Unspecified constipation   . URINARY INCONTINENCE 07/07/2007  . UTI'S, RECURRENT 11/20/2009   kimbrough    Past Surgical History:  Procedure Laterality Date  . BUNIONECTOMY    . COLONOSCOPY    . LAPAROSCOPIC APPENDECTOMY  01/19/2016   Procedure: APPENDECTOMY LAPAROSCOPIC with orifice of cecal polyp;  Surgeon: Leighton Ruff, MD;  Location: [facility];  Service: General;;  . [place] open neck exploration  . POLYPECTOMY    . RIGHT OOPHORECTOMY    . TONSILLECTOMY      Outpatient Encounter Prescriptions as of 03/27/2017  Medication Sig  . aspirin 81 MG tablet Take 81 mg by mouth daily.    [name] atorvastatin (LIPITOR) 20 MG tablet TAKE 1 TABLET EACH DAY.  . cholecalciferol (VITAMIN D) 1000 UNITS tablet Take 1,000 Units by mouth daily.  [name] ibuprofen (ADVIL,MOTRIN) 600 MG tablet Take 1 tablet (600 mg total) by mouth every 6 (six) hours as needed for moderate pain.  [name] losartan (COZAAR) 25 MG tablet TAKE 1 TABLET ONCE DAILY.  [name] polyethylene glycol powder (GLYCOLAX/MIRALAX) powder DISSOLVE ONE CAPFUL IN 8 0Z.  FLUID ONCE DAILY.  [name] senna (SENOKOT) 8.6 MG TABS tablet Take 1 tablet (8.6 mg total) by mouth 2 (two) times daily.  . [DISCONTINUED] oxybutynin (DITROPAN-XL) 5 MG 24 hr tablet Take 5 mg by mouth every other day.   . nitroGLYCERIN (NITROSTAT) 0.4 MG SL tablet Place 1 tablet (0.4 mg total) under the tongue every 5 (five) minutes as needed for chest pain.   No facility-administered  encounter medications on file as of 03/27/2017.     Allergies: Anesthetics, amide and Lisinopril  Social History   Social History  . Marital status: Married    Spouse name: N/A  . Number of children: 3  . Years of education: N/A   Occupational History  . travel agent Retired   Social History Main Topics  . Smoking status: Never Smoker  . Smokeless tobacco: Never Used  . Alcohol use 4.2 oz/week    7 Glasses of wine per week     Comment: last drink 3 weeks ago  . Drug use: No  . Sexual activity: No   Other Topics Concern  . Not on file   Social History Narrative   HH of 5    Kids at home now   Married   travel agent.    2-3 c coffee in am    ocass wine    G3P2vaginal delivery   Pt cell [phone]             Family History  Problem Relation Age of Onset  . Sudden death Father 30  . Heart disease Father 100       MI  . Heart disease Mother        valve replacement  . Breast cancer Other        1st degree relative <50  . Diabetes Unknown        1st degree relative  . Colon cancer Neg Hx   . Stomach cancer Neg Hx     Review of Systems: A 12-system review of systems was performed and was negative except as noted in the HPI.  --------------------------------------------------------------------------------------------------  Physical Exam: BP 120/78 (BP Location: Left Arm, Patient Position: Sitting, Cuff Size: Normal)   Pulse 68   Ht 5\' 3"  (1.6 m)   Wt 113 lb 12.8 oz (51.6 kg)   SpO2 98%   BMI 20.16 kg/m   General:  Thin, elderly woman seated comfortably in the exam room. She is accompanied by her husband. HEENT: No conjunctival pallor or scleral icterus.  Moist mucous membranes.  OP clear. Neck: Supple without lymphadenopathy, thyromegaly, JVD, or HJR. Lungs: Normal work of breathing.  Clear to auscultation bilaterally without wheezes or crackles. Heart: Regular rate and rhythm without murmurs, rubs, or gallops.  Non-displaced PMI. Abd: Bowel sounds  present.  Soft, NT/ND without hepatosplenomegaly Ext: No lower extremity edema.  Radial, PT, and DP pulses are 2+ bilaterally. Skin: warm and dry without rash  Pharmacologic myocardial perfusion stress test (03/24/17): High risk study with small in size, mild in severity, basal inferoseptal and mid inferoseptal defects that could reflect diaphragmatic attenuation and/or ischemia. Is also a small in size, moderate in severity, partially reversible apical defect. Moderate in size, moderate in severity, basal anteroseptal, mid anteroseptal, and apical septal defect is reversible and concerning for ischemia. LVEF 67%.  Holter monitor (02/18/17): Predominantly sinus rhythm with rare PACs and PVCs. No significant arrhythmias.  Lab Results  Component Value Date   WBC 6.0 02/09/2017   HGB 12.0 02/09/2017  HCT 35.9 (L) 02/09/2017   MCV 91.6 02/09/2017   PLT 162 02/09/2017    Lab Results  Component Value Date   NA 142 02/09/2017   K 3.9 02/09/2017   CL 112 (H) 02/09/2017   CO2 25 02/09/2017   BUN 11 02/09/2017   CREATININE 0.53 02/09/2017   GLUCOSE 90 02/09/2017   ALT 15 02/07/2017    Lab Results  Component Value Date   CHOL 132 01/25/2015   HDL 68 01/25/2015   LDLCALC 51 01/25/2015   LDLDIRECT 156.3 03/28/2011   TRIG 64 01/25/2015   CHOLHDL 1.9 01/25/2015    --------------------------------------------------------------------------------------------------  ASSESSMENT AND PLAN: Orthostatic hypotension Patient's blood pressure remains labile, though normal on assessment today. Etiology has not yet been found. Given high risk stress test, I am concerned that CAD may be contributing. We have discussed proceeding with left heart catheterization, which the patient is in agreement with. If this is normal, we could consider renal artery Doppler in the futurel. I have reviewed the risks, indications, and alternatives to cardiac catheterization, possible angioplasty, and stenting with the  patient. Risks include but are not limited to bleeding, infection, vascular injury, stroke, myocardial infection, arrhythmia, kidney injury, radiation-related injury in the case of prolonged fluoroscopy use, emergency cardiac surgery, and death. The patient understands the risks of serious complication is 1-2 in 4665 with diagnostic cardiac cath and 1-2% or less with angioplasty/stenting. No medication changes today.  Atypical chest pain and abnormal stress test No symptoms since last visit, the patient has been relatively sedentary. In light of high risk stress test, I have recommended left heart catheterization. The patient initially wished to defer this until she follows up with her PCP next month, she has now asked that we proceed before then. We will obtain routine precatheterization labs anticipation of LHC later this month.  Sinus bradycardia Holter monitor shows reasonable heart rate response. Her heart rate today is normal. We will not make any medication changes at this time.  Hypertension Blood pressure normal today though it remains labile at home. Given history of orthostatic lightheadedness, we will defer medication changes today pending left heart catheterization.  Follow-up: Return to clinic in early June.  Nelva Bush, MD 03/29/2017 6:22 PM

## 2017-04-07 NOTE — Discharge Instructions (Signed)
Radial Site Care °Refer to this sheet in the next few weeks. These instructions provide you with information about caring for yourself after your procedure. Your health care provider may also give you more specific instructions. Your treatment has been planned according to current medical practices, but problems sometimes occur. Call your health care provider if you have any problems or questions after your procedure. °What can I expect after the procedure? °After your procedure, it is typical to have the following: °· Bruising at the radial site that usually fades within 1-2 weeks. °· Blood collecting in the tissue (hematoma) that may be painful to the touch. It should usually decrease in size and tenderness within 1-2 weeks. °Follow these instructions at home: °· Take medicines only as directed by your health care provider. °· You may shower 24-48 hours after the procedure or as directed by your health care provider. Remove the bandage (dressing) and gently wash the site with plain soap and water. Pat the area dry with a clean towel. Do not rub the site, because this may cause bleeding. °· Do not take baths, swim, or use a hot tub until your health care provider approves. °· Check your insertion site every day for redness, swelling, or drainage. °· Do not apply powder or lotion to the site. °· Do not flex or bend the affected arm for 24 hours or as directed by your health care provider. °· Do not push or pull heavy objects with the affected arm for 24 hours or as directed by your health care provider. °· Do not lift over 10 lb (4.5 kg) for 5 days after your procedure or as directed by your health care provider. °· Ask your health care provider when it is okay to: °¨ Return to work or school. °¨ Resume usual physical activities or sports. °¨ Resume sexual activity. °· Do not drive home if you are discharged the same day as the procedure. Have someone else drive you. °· You may drive 24 hours after the procedure  unless otherwise instructed by your health care provider. °· Do not operate machinery or power tools for 24 hours after the procedure. °· If your procedure was done as an outpatient procedure, which means that you went home the same day as your procedure, a responsible adult should be with you for the first 24 hours after you arrive home. °· Keep all follow-up visits as directed by your health care provider. This is important. °Contact a health care provider if: °· You have a fever. °· You have chills. °· You have increased bleeding from the radial site. Hold pressure on the site. °Get help right away if: °· You have unusual pain at the radial site. °· You have redness, warmth, or swelling at the radial site. °· You have drainage (other than a small amount of blood on the dressing) from the radial site. °· The radial site is bleeding, and the bleeding does not stop after 30 minutes of holding steady pressure on the site. °· Your arm or hand becomes pale, cool, tingly, or numb. °This information is not intended to replace advice given to you by your health care provider. Make sure you discuss any questions you have with your health care provider. °Document Released: 12/07/2010 Document Revised: 04/11/2016 Document Reviewed: 05/23/2014 °Elsevier Interactive Patient Education © 2017 [employer] ° °

## 2017-04-08 ENCOUNTER — Encounter (HOSPITAL_COMMUNITY): Payer: Self-pay | Admitting: Internal Medicine

## 2017-04-16 DIAGNOSIS — H5034 Intermittent alternating exotropia: Secondary | ICD-10-CM | POA: Diagnosis not present

## 2017-04-18 DIAGNOSIS — I951 Orthostatic hypotension: Secondary | ICD-10-CM | POA: Diagnosis not present

## 2017-04-18 DIAGNOSIS — K5901 Slow transit constipation: Secondary | ICD-10-CM | POA: Diagnosis not present

## 2017-04-18 DIAGNOSIS — F419 Anxiety disorder, unspecified: Secondary | ICD-10-CM | POA: Diagnosis not present

## 2017-04-18 DIAGNOSIS — M4854XA Collapsed vertebra, not elsewhere classified, thoracic region, initial encounter for fracture: Secondary | ICD-10-CM | POA: Diagnosis not present

## 2017-04-18 DIAGNOSIS — M810 Age-related osteoporosis without current pathological fracture: Secondary | ICD-10-CM | POA: Diagnosis not present

## 2017-04-22 ENCOUNTER — Ambulatory Visit: Payer: Medicare Other | Attending: Sports Medicine

## 2017-04-22 DIAGNOSIS — M6281 Muscle weakness (generalized): Secondary | ICD-10-CM | POA: Diagnosis not present

## 2017-04-22 DIAGNOSIS — R293 Abnormal posture: Secondary | ICD-10-CM | POA: Insufficient documentation

## 2017-04-22 DIAGNOSIS — M546 Pain in thoracic spine: Secondary | ICD-10-CM | POA: Diagnosis not present

## 2017-04-22 DIAGNOSIS — R279 Unspecified lack of coordination: Secondary | ICD-10-CM | POA: Insufficient documentation

## 2017-04-22 DIAGNOSIS — R296 Repeated falls: Secondary | ICD-10-CM | POA: Insufficient documentation

## 2017-04-22 NOTE — Therapy (Signed)
[facility] [address] [place], [place], [address] Phone: [phone]   Fax:  [phone]  Physical Therapy Evaluation  Patient Details  Name: [name] MRN: [id number] Date of Birth: 1940-12-15 Referring Provider: Wandra Feinstein, MD  Encounter Date: 04/22/2017      PT End of Session - 04/22/17 1016    Visit Number 1   Number of Visits 10   Date for PT Re-Evaluation 06/18/2017   Authorization Type G-codes, KX at 36   PT Start Time 0935   PT Stop Time 1023   PT Time Calculation (min) 48 min   Activity Tolerance Patient tolerated treatment well   Behavior During Therapy [facility] for tasks assessed/performed      Past Medical History:  Diagnosis Date  . Abdominal pain, unspecified site   . ADVERSE REACTION TO MEDICATION 07/31/2009  . ANEMIA-NOS 07/07/2007  . Arthritis   . Chest pain, unspecified   . Chronic kidney disease    hx of kidney stone  . COLONIC POLYPS, ADENOMATOUS, HX OF   . Disturbance of skin sensation   . DIVERTICULOSIS, COLON 02/23/2009  . Family history of diabetes mellitus   . Family history of malignant neoplasm of breast   . FATIGUE 05/25/2009  . Fever, unspecified   . Heart murmur    hx of  . HYPERLIPIDEMIA 07/07/2007  . HYPERPARATHYROIDISM UNSPECIFIED 10/20/2007  . HYPERTENSION 02/04/2008  . NEPHROLITHIASIS, HX OF 11/15/2008  . OSTEOPOROSIS 01/01/2008  . Other acquired absence of organ    parathyroidectomy  . Pain in limb   . PALPITATIONS, OCCASIONAL 07/12/2008  . PARATHYROIDECTOMY 01/02/2009  . SCOLIOSIS 05/25/2009  . Seasonal allergies   . Unspecified constipation   . URINARY INCONTINENCE 07/07/2007  . UTI'S, RECURRENT 11/20/2009   kimbrough     Past Surgical History:  Procedure Laterality Date  . BUNIONECTOMY    . COLONOSCOPY    . LAPAROSCOPIC APPENDECTOMY  01/19/2016   Procedure: APPENDECTOMY LAPAROSCOPIC with orifice of cecal polyp;  Surgeon: Leighton Ruff, MD;  Location: [facility];   Service: General;;  . LEFT HEART CATH AND CORONARY ANGIOGRAPHY N/A 04/07/2017   Procedure: Left Heart Cath and Coronary Angiography;  Surgeon: Nelva Bush, MD;  Location: [place] [facility];  Service: Cardiovascular;  Laterality: N/A;  . PARATHYROIDECTOMY     Rt Superior open neck exploration  . POLYPECTOMY    . RIGHT OOPHORECTOMY    . TONSILLECTOMY      There were no vitals filed for this visit.       Subjective Assessment - 04/22/17 0939    Subjective Pt presents to PT with osteoporosis and T11 compression fracture sustained 02/06/17 after a fall. Pt tripped at home and landed on her buttocks.     Pertinent History osteoporosis. T11 compression fracture s/p fall 02/06/17   Limitations Standing;Sitting;Walking   How long can you sit comfortably? 10 minutes    How long can you stand comfortably? 10 minutes   How long can you walk comfortably? 10 minutes   Diagnostic tests x-ray to monitor healing   Patient Stated Goals improve strength, regain mobility/strength,    Currently in Pain? Yes   Pain Score 5    Pain Location Thoracic   Pain Orientation Right;Left;Mid   Pain Descriptors / Indicators Burning   Pain Type Acute pain   Pain Onset More than a month ago   Pain Frequency Constant   Aggravating Factors  sitting, standing and walking > 10 minutes, use  of arms with cooking   Pain Relieving Factors Tylenol (not helping), supine position   Effect of Pain on Daily Activities not able to cook, sit > 10 minutes or walk > 10 minutes            [facility] PT Assessment - 04/22/17 0001      Assessment   Medical Diagnosis osteoporosis, T11 compression fracture   Referring Provider Wandra Feinstein, MD   Onset Date/Surgical Date 02/06/17   Next MD Visit none   Prior Therapy for osteoporosis     Precautions   Precautions Other (comment);Fall  osteoporosis   Precaution Comments osteoporosis, falls     Restrictions   Weight Bearing Restrictions No     Balance Screen   Has  the patient fallen in the past 6 months Yes   How many times? 1   Has the patient had a decrease in activity level because of a fear of falling?  No   Is the patient reluctant to leave their home because of a fear of falling?  No     Home Environment   Living Environment Private residence   Type of [place] One level     Prior Function   Level of Independence Independent   Vocation Retired   Biomedical scientist none   Leisure none     Cognition   Overall Cognitive Status Within Functional Limits for tasks assessed     Observation/Other Assessments   Focus on Therapeutic Outcomes (FOTO)  35% limitation     Posture/Postural Control   Posture/Postural Control Postural limitations   Postural Limitations Forward head;Rounded Shoulders;Increased thoracic kyphosis  thoracic scoliosis     ROM / Strength   AROM / PROM / Strength AROM;Strength     AROM   Overall AROM  Within functional limits for tasks performed   Overall AROM Comments cervical A/ROM is full with thoracic pain reported at end range sidebending.       Strength   Overall Strength Deficits   Overall Strength Comments Bil shoulder flexion 4-/5, abduction 4/5, ER 4-/5, IR 4/5     Palpation   Palpation comment palpalbe tenderness over bil thoracic paraspinals T4-12     Transfers   Transfers Stand to Sit;Sit to Stand   Sit to Stand With upper extremity assist   Five time sit to stand comments  21 seconds   Stand to Sit With upper extremity assist     Ambulation/Gait   Ambulation/Gait Yes   Ambulation/Gait Assistance 6: Modified independent (Device/Increase time)   Gait Pattern Decreased arm swing - right;Decreased arm swing - left;Step-through pattern            Objective measurements completed on examination: See above findings.          [facility] Adult PT Treatment/Exercise - 04/22/17 0001      Modalities   Modalities Electrical Stimulation;Cryotherapy     Cryotherapy   Number  Minutes Cryotherapy 15 Minutes   Cryotherapy Location Lumbar Spine   Type of Cryotherapy Ice pack     Electrical Stimulation   Electrical Stimulation Location thoracic spine   Electrical Stimulation Action IFC   Electrical Stimulation Parameters 15 minutes   Electrical Stimulation Goals Pain                PT Education - 04/22/17 1001    Education provided Yes   Education Details standing hip abduction and extension, scapular squeezes, shoulder flexion. posture   Person(s) Educated  Patient   Methods Explanation;Demonstration;Handout   Comprehension Verbalized understanding;Returned demonstration          PT Short Term Goals - 04/22/17 1012      PT SHORT TERM GOAL #1   Title be independent in initial HEP   Time 4   Period Weeks   Status New     PT SHORT TERM GOAL #2   Title verbalize and demonstrate postural modifications and body mechanics modifications needed for spinal protection to prevent future fracture   Time 4   Period Weeks   Status New     PT SHORT TERM GOAL #3   Title reduce pain to sit/stand/walk for > or = to 15 minutes without limitation   Time 4   Period Weeks   Status New     PT SHORT TERM GOAL #4   Title perform 5x sit to stand in < or = to 16 seconds to reduce risk of falls   Time 5   Status New           PT Long Term Goals - 04/22/17 0933      PT LONG TERM GOAL #1   Time 8   Period Weeks   Status New     PT LONG TERM GOAL #2   Title reduce FOTO to < or = to 39% limitation   Time 8   Period Weeks   Status New     PT LONG TERM GOAL #3   Title return to walking for exercise 20-30 minutes without limitation due to pain   Time 8   Period Weeks   Status New     PT LONG TERM GOAL #4   Title reduce pain to sit/stand/walk for > or = to 20-25 minutes without limitation   Time 8   Period Weeks   Status New     PT LONG TERM GOAL #5   Title demonstrate > or = to 4+/5 bil UE strength to improve endurance for cooking tasks    Time 8   Period Weeks   Status New     Additional Long Term Goals   Additional Long Term Goals Yes     PT LONG TERM GOAL #6   Title perform 5x sit to stand in < or = to 13 seconds to reduce risk of falls   Time 8   Period Weeks   Status New                Plan - 04/22/17 1023    Clinical Impression Statement Pt presents to PT s/p T11 fracture due to fall 02/06/17.  Pt has been limiting her activity since the fall per MD orders to allow fracture to heal.  Pt demonstrates forward head, rounded shoulders and thoracic kyphosis and scoliosis.  Pt with decreased strength in bil UEs, pain in the thoracic spine which limits sitting and standing, reduced postural strength, balance deficits and 5x sit to stand is 21 seconds indicating a falls risk.  Pt will benefit from skilled PT for postural strength, training, pain management and balance training to reduce falls risk.     History and Personal Factors relevant to plan of care: osteoporosis, multiple spinal fractures, falls   Clinical Presentation Evolving   Clinical Presentation due to: evolving condition: immobilization x 7 weeks after T11 fracture now weakned and with limited mobility.  Variable pain levels limit functional mobility   Clinical Decision Making Moderate   Rehab Potential Good   PT Frequency  2x / week   PT Duration 8 weeks   PT Treatment/Interventions ADLs/Self Care Home Management;Cryotherapy;Electrical Stimulation;Functional mobility training;Stair training;Gait training;Ultrasound;Moist Heat;Therapeutic activities;Therapeutic exercise;Balance training;Neuromuscular re-education;Patient/family education;Manual techniques;Taping   PT Next Visit Plan postural strength, upper extremity strength, balance exercises, pain management   Consulted and Agree with Plan of Care Patient      Patient will benefit from skilled therapeutic intervention in order to improve the following deficits and impairments:  Postural dysfunction,  Decreased strength, Impaired flexibility, Improper body mechanics, Pain, Decreased activity tolerance, Decreased endurance, Increased muscle spasms, Impaired UE functional use  Visit Diagnosis: Muscle weakness (generalized) - Plan: PT plan of care cert/re-cert  Pain in thoracic spine - Plan: PT plan of care cert/re-cert  Abnormal posture - Plan: PT plan of care cert/re-cert  Repeated falls - Plan: PT plan of care cert/re-cert      G-Codes - 47/65/46 5035    Functional Assessment Tool Used (Outpatient Only) FOTO: 52% limitation   Functional Limitation Other PT primary   Other PT Primary Current Status (W6568) At least 40 percent but less than 60 percent impaired, limited or restricted   Other PT Primary Goal Status (L2751) At least 20 percent but less than 40 percent impaired, limited or restricted       Problem List Patient Active Problem List   Diagnosis Date Noted  . Syncope 04/07/2017  . Shortness of breath 04/07/2017  . Abnormal stress test 04/07/2017  . Sinus bradycardia 02/21/2017  . Coronary artery calcification 02/21/2017  . Compression fracture of thoracic vertebra ([place]) 02/08/2017  . Dizziness and giddiness 02/08/2017  . Atrial fibrillation with rapid ventricular response ([place]) 01/21/2016  . Colon polyp s/p appy/partialcecetomy 01/19/3016 01/19/2016  . Orthostatic dizziness 04/25/2014  . Vertigo 12/26/2012  . Hypertension, uncontrolled 09/11/2012  . Diastolic dysfunction 70/11/7492  . Sleep disturbance 09/17/2011  . Hypotension 12/26/2010  . Orthostatic hypotension 12/26/2010  . UTI'S, RECURRENT 11/20/2009  . ADVERSE REACTION TO MEDICATION 07/31/2009  . SCOLIOSIS 05/25/2009  . FATIGUE 05/25/2009  . DIVERTICULOSIS, COLON 02/23/2009  . COLONIC POLYPS, ADENOMATOUS, HX OF 02/23/2009  . PARATHYROIDECTOMY 01/02/2009  . NEPHROLITHIASIS, HX OF 11/15/2008  . PALPITATIONS, OCCASIONAL 07/12/2008  . Essential hypertension 02/04/2008  . Constipation 01/01/2008  . LEG  PAIN, LEFT 01/01/2008  . OSTEOPOROSIS 01/01/2008  . HYPERPARATHYROIDISM UNSPECIFIED 10/20/2007  . HYPERLIPIDEMIA 07/07/2007  . ANEMIA-NOS 07/07/2007  . URINARY INCONTINENCE 07/07/2007    Sigurd Sos, PT 04/22/17 10:59 AM   [facility] [address] [place], [place], [address] Phone: [phone]   Fax:  [phone]  Name: [name] MRN: [id number] Date of Birth: 1941/01/13

## 2017-04-22 NOTE — Patient Instructions (Addendum)
Posture - Standing   Good posture is important. Avoid slouching and forward head thrust. Maintain curve in low back and align ears over shoulders, hips over ankles.  Pull your belly button in toward your back bone. Posture Tips DO: - stand tall and erect - keep chin tucked in - keep head and shoulders in alignment - check posture regularly in mirror or large window - pull head back against headrest in car seat;  Change your position often.  Sit with lumbar support. DON'T: - slouch or slump while watching TV or reading - sit, stand or lie in one position  for too long;  Sitting is especially hard on the spine so if you sit at a desk/use the computer, then stand up often! Copyright  VHI. All rights reserved.  Posture - Sitting  Sit upright, head facing forward. Try using a roll to support lower back. Keep shoulders relaxed, and avoid rounded back. Keep hips level with knees. Avoid crossing legs for long periods. Copyright  VHI. All rights reserved.  Chronic neck strain can develop because of poor posture and faulty work habits  Postural strain related to slumped sitting and forward head posture is a leading cause of headaches, neck and upper back pain  General strengthening and flexibility exercises are helpful in the treatment of neck pain.  Most importantly, you should learn to correct the posture that may be contributing to chronic pain.   Change positions frequently  Change your work or home environment to improve posture and mechanics.   SHOULDER: Flexion Unilateral (Weight)- both arms at the same time    Start with arm at side. Raise arm forward and up. Keep elbow straight. 2x10, 2-3 times a day.    Scapular Retraction: Elbow Flexion (Standing)   With elbows bent to 90, pinch shoulder blades together and rotate arms out, keeping elbows bent. Repeat _10___ times per set. Do _1___ sets per session. Do many____ sessions per day.    ABDUCTION: Standing (Active)   Stand,  feet flat. Lift right leg out to side. Use _0__ lbs. Complete _2x_10_ repetitions. Perform __2_ sessions per day.    EXTENSION: Standing (Active)  Stand, both feet flat. Draw right leg behind body as far as possible. Use 0___ lbs. Complete 2x10 repetitions. Perform __2_ sessions per day.  Copyright  VHI. All rights reserved.    [facility] [address], [place], [place] [address] Phone # [phone] Fax [phone]

## 2017-04-28 ENCOUNTER — Ambulatory Visit (INDEPENDENT_AMBULATORY_CARE_PROVIDER_SITE_OTHER): Payer: Medicare Other | Admitting: Internal Medicine

## 2017-04-28 ENCOUNTER — Encounter: Payer: Self-pay | Admitting: Internal Medicine

## 2017-04-28 VITALS — BP 132/78 | HR 76 | Ht 61.0 in | Wt 114.0 lb

## 2017-04-28 DIAGNOSIS — R001 Bradycardia, unspecified: Secondary | ICD-10-CM | POA: Diagnosis not present

## 2017-04-28 DIAGNOSIS — I10 Essential (primary) hypertension: Secondary | ICD-10-CM | POA: Diagnosis not present

## 2017-04-28 DIAGNOSIS — R42 Dizziness and giddiness: Secondary | ICD-10-CM | POA: Diagnosis not present

## 2017-04-28 DIAGNOSIS — I2510 Atherosclerotic heart disease of native coronary artery without angina pectoris: Secondary | ICD-10-CM | POA: Insufficient documentation

## 2017-04-28 NOTE — Patient Instructions (Signed)
Medication Instructions:  Your physician recommends that you continue on your current medications as directed. Please refer to the Current Medication list given to you today.   Labwork: None   Testing/Procedures: None   Follow-Up: Your physician wants you to follow-up in: 6 months with Dr. Saunders Revel (December 2018).  You will receive a reminder letter in the mail two months in advance. If you don't receive a letter, please call our office to schedule the follow-up appointment.        If you need a refill on your cardiac medications before your next appointment, please call your pharmacy.

## 2017-04-28 NOTE — Progress Notes (Signed)
Follow-up Outpatient Visit Date: 04/28/2017  Primary Care Provider: Lanice Shirts, MD [address] [place] [address]  Chief Complaint: Follow-up orthostatic hypotension and bradycardia  HPI:  [name] is a 76 y.o. year-old female with history of labile hypertension, occasional orthostatic type symptoms, hyperlipidemia, and paroxysmal atrial fibrillation, who presents for follow-up of orthostatic hypotension and bradycardia. I last saw her on 03/27/17 for discussion of abnormal stress test results. Though she was feeling well at the time, we agreed to proceed with LHC due to high-risk stress test findings. Catheterization showed moderate disease involving small diagonal branches but no significant epicardial CAD.  Today, [name] reports feeling well with the exception of continued intermittent back pain. She is participating in physical therapy following her compression fracture. She has not experienced any chest pain, shortness of breath, lightheadedness, or edema. Her blood pressure remains somewhat labile at home, with systolic readings up to 606 mmHg.  --------------------------------------------------------------------------------------------------  Cardiovascular History & Procedures:  Recent CV Pertinent Labs: Lab Results  Component Value Date   CHOL 132 01/25/2015   HDL 68 01/25/2015   LDLCALC 51 01/25/2015   LDLDIRECT 156.3 03/28/2011   TRIG 64 01/25/2015   CHOLHDL 1.9 01/25/2015   INR 1.0 04/01/2017   K 4.2 04/01/2017   BUN 13 04/01/2017   CREATININE 0.63 04/01/2017   CREATININE 0.64 01/25/2015    Past Medical History:  Diagnosis Date  . Abdominal pain, unspecified site   . ADVERSE REACTION TO MEDICATION 07/31/2009  . ANEMIA-NOS 07/07/2007  . Arthritis   . Chest pain, unspecified   . Chronic kidney disease    hx of kidney stone  . COLONIC POLYPS, ADENOMATOUS, HX OF   . Disturbance of skin sensation   . DIVERTICULOSIS, COLON 02/23/2009  .  Family history of diabetes mellitus   . Family history of malignant neoplasm of breast   . FATIGUE 05/25/2009  . Fever, unspecified   . Heart murmur    hx of  . HYPERLIPIDEMIA 07/07/2007  . HYPERPARATHYROIDISM UNSPECIFIED 10/20/2007  . HYPERTENSION 02/04/2008  . NEPHROLITHIASIS, HX OF 11/15/2008  . OSTEOPOROSIS 01/01/2008  . Other acquired absence of organ    parathyroidectomy  . Pain in limb   . PALPITATIONS, OCCASIONAL 07/12/2008  . PARATHYROIDECTOMY 01/02/2009  . SCOLIOSIS 05/25/2009  . Seasonal allergies   . Unspecified constipation   . URINARY INCONTINENCE 07/07/2007  . UTI'S, RECURRENT 11/20/2009   kimbrough    Past Surgical History:  Procedure Laterality Date  . BUNIONECTOMY    . COLONOSCOPY    . LAPAROSCOPIC APPENDECTOMY  01/19/2016   Procedure: APPENDECTOMY LAPAROSCOPIC with orifice of cecal polyp;  Surgeon: Leighton Ruff, MD;  Location: [facility];  Service: General;;  . LEFT HEART CATH AND CORONARY ANGIOGRAPHY N/A 04/07/2017   Procedure: Left Heart Cath and Coronary Angiography;  Surgeon: Nelva Bush, MD;  Location: [place] [facility] [facility];  Service: Cardiovascular;  Laterality: N/A;  . PARATHYROIDECTOMY     Rt Superior open neck exploration  . POLYPECTOMY    . RIGHT OOPHORECTOMY    . TONSILLECTOMY      Outpatient Encounter Prescriptions as of 04/28/2017  Medication Sig  . acetaminophen (TYLENOL) 500 MG tablet Take 1,000 mg by mouth 2 (two) times daily as needed for moderate pain.  [name] aspirin 81 MG tablet Take 81 mg by mouth every evening.   [name] atorvastatin (LIPITOR) 20 MG tablet TAKE 1 TABLET EACH DAY. (Patient taking differently: TAKE 1 TABLET EACH DAY IN THE  EVENING)  . cholecalciferol (VITAMIN D) 1000 UNITS tablet Take 1,000 Units by mouth every evening.   . diphenhydramine-acetaminophen (TYLENOL PM) 25-500 MG TABS tablet Take 0.5 tablets by mouth at bedtime as needed (SLEEP).  [name] losartan (COZAAR) 25 MG tablet TAKE 1 TABLET ONCE DAILY. (Patient taking differently: TAKE 1 TABLET  ONCE DAILY IN THE EVENING)  . nitroGLYCERIN (NITROSTAT) 0.4 MG SL tablet Place 1 tablet (0.4 mg total) under the tongue every 5 (five) minutes as needed for chest pain.  . [DISCONTINUED] senna (SENOKOT) 8.6 MG TABS tablet Take 1 tablet (8.6 mg total) by mouth 2 (two) times daily. (Patient not taking: Reported on 04/28/2017)   No facility-administered encounter medications on file as of 04/28/2017.     Allergies: Anesthetics, amide and Lisinopril  Social History   Social History  . Marital status: Married    Spouse name: N/A  . Number of children: 3  . Years of education: N/A   Occupational History  . travel agent Retired   Social History Main Topics  . Smoking status: Never Smoker  . Smokeless tobacco: Never Used  . Alcohol use 4.2 oz/week    7 Glasses of wine per week     Comment: last drink 3 weeks ago  . Drug use: No  . Sexual activity: No   Other Topics Concern  . Not on file   Social History Narrative   HH of 5    Kids at home now   Married   travel agent.    2-3 c coffee in am    ocass wine    G3P2vaginal delivery   Pt cell [phone]             Family History  Problem Relation Age of Onset  . Sudden death Father 47  . Heart disease Father 37       MI  . Heart disease Mother        valve replacement  . Breast cancer Other        1st degree relative <50  . Diabetes Unknown        1st degree relative  . Colon cancer Neg Hx   . Stomach cancer Neg Hx     Review of Systems: A 12-system review of systems was performed and was negative except as noted in the HPI.  --------------------------------------------------------------------------------------------------  Physical Exam: BP 104/66   Pulse 76   Ht 5\' 1"  (1.549 m)   Wt 114 lb (51.7 kg)   BMI 21.54 kg/m   Repeat BP: 132/78  General:  Slender woman, seated comfortably in the exam room. HEENT: No conjunctival pallor or scleral icterus.  Moist mucous membranes.  OP clear. Neck: Supple without  lymphadenopathy, thyromegaly, JVD, or HJR. Lungs: Normal work of breathing.  Clear to auscultation bilaterally without wheezes or crackles. Heart: Regular rate and rhythm without murmurs, rubs, or gallops.  Non-displaced PMI. Abd: Bowel sounds present.  Soft, NT/ND without hepatosplenomegaly Ext: No lower extremity edema.  Radial, PT, and DP pulses are 2+ bilaterally. Right radial arteriotomy site is well-healed. Skin: Warm and dry without rash.  LHC (04/07/17): Minimal CAD involving large epicardial coronary arteries. Mild to moderate stenosis of diagonal branches is evident. Normal LV contraction. Normal LV filling pressure. Small right radial artery.  Pharmacologic myocardial perfusion stress test (03/24/17): High risk study with small in size, mild in severity, basal inferoseptal and mid inferoseptal defects that could reflect diaphragmatic attenuation and/or ischemia. Is also a small in size, moderate  in severity, partially reversible apical defect. Moderate in size, moderate in severity, basal anteroseptal, mid anteroseptal, and apical septal defect is reversible and concerning for ischemia. LVEF 67%.  Holter monitor (02/18/17): Predominantly sinus rhythm with rare PACs and PVCs. No significant arrhythmias.  Lab Results  Component Value Date   WBC 4.5 04/01/2017   HGB 12.8 04/01/2017   HCT 39.3 04/01/2017   MCV 94 04/01/2017   PLT 249 04/01/2017    Lab Results  Component Value Date   NA 140 04/01/2017   K 4.2 04/01/2017   CL 101 04/01/2017   CO2 22 04/01/2017   BUN 13 04/01/2017   CREATININE 0.63 04/01/2017   GLUCOSE 90 04/01/2017   ALT 15 02/07/2017    Lab Results  Component Value Date   CHOL 132 01/25/2015   HDL 68 01/25/2015   LDLCALC 51 01/25/2015   LDLDIRECT 156.3 03/28/2011   TRIG 64 01/25/2015   CHOLHDL 1.9 01/25/2015    --------------------------------------------------------------------------------------------------  ASSESSMENT AND PLAN: Orthostatic  lightheadedness No further symptoms since cardiac catheterization. Initial BP low normal today but upper normal on recheck. We will allow a degree of permissive hypertension. I have asked Ms. Whipp to continue her current regimen of losartan 25 mg daily. She should check her blood pressure periodically at home and alert [place] if it is above 160/90.  Coronary artery disease without angina No further chest pain. Cardiac catheterization showed moderate CAD involving diagonal branches but no significant stenosis involving a major epicardial coronary artery. We will continue with medical therapy. I have asked Ms. Mckercher to follow-up with her PCP regarding management of her lipids.  Sinus bradycardia Heart rate normal today. No symptomatic bradycardia reported by the patient. We will continue to avoid beta-blockers and non-dihydropyridine calcium channel blockers due to history of bradycardia.  Hypertension Blood pressure is normal today. As above, we will not make any medication changes today. Ms. Graefe was asked to continue monitoring her BP at home.  Follow-up: Return to clinic in 6 months.  Nelva Bush, MD 04/28/2017 1:35 PM

## 2017-04-30 ENCOUNTER — Encounter: Payer: Self-pay | Admitting: Physical Therapy

## 2017-04-30 ENCOUNTER — Ambulatory Visit: Payer: Medicare Other | Admitting: Physical Therapy

## 2017-04-30 DIAGNOSIS — M546 Pain in thoracic spine: Secondary | ICD-10-CM | POA: Diagnosis not present

## 2017-04-30 DIAGNOSIS — R279 Unspecified lack of coordination: Secondary | ICD-10-CM | POA: Diagnosis not present

## 2017-04-30 DIAGNOSIS — M6281 Muscle weakness (generalized): Secondary | ICD-10-CM

## 2017-04-30 DIAGNOSIS — R296 Repeated falls: Secondary | ICD-10-CM | POA: Diagnosis not present

## 2017-04-30 DIAGNOSIS — R293 Abnormal posture: Secondary | ICD-10-CM

## 2017-04-30 NOTE — Patient Instructions (Addendum)
Strengthening: Resisted Diagonal    Hold tubing with right arm down across body, thumb pointing back. Pull arm up and out, rotating arm to palm forward. Repeat ____ times per set. Do ____ sets per session. Do ____ sessions per day.  http://orth.exer.us/840   Copyright  VHI. All rights reserved.  Strengthening: Resisted Extension    Hold tubing in right hand, arm forward. Pull arm back, elbow straight. Repeat ____ times per set. Do ____ sets per session. Do ____ sessions per day.  http://orth.exer.us/832   Copyright  VHI. All rights reserved.  Scapular Retraction: Bilateral    Facing anchor, pull arms back, bringing shoulder blades together. Repeat ____ times per set. Do ____ sets per session. Do ____ sessions per day.  http://orth.exer.us/176   Copyright  VHI. All rights reserved.

## 2017-04-30 NOTE — Therapy (Signed)
[facility] [address], [place], [place], [address] Phone: [phone]   Fax:  [phone]  Physical Therapy Treatment  Patient Details  Name: [name] MRN: [id number] Date of Birth: 1941/10/21 Referring Provider: Wandra Feinstein, MD  Encounter Date: 04/30/2017      PT End of Session - 04/30/17 0959    Visit Number 2   Number of Visits 10   Date for PT Re-Evaluation 06-30-17   Authorization Type G-codes, KX at 76   PT Start Time 0932   PT Stop Time 1015   PT Time Calculation (min) 43 min   Activity Tolerance Patient tolerated treatment well   Behavior During Therapy [facility] for tasks assessed/performed      Past Medical History:  Diagnosis Date  . Abdominal pain, unspecified site   . ADVERSE REACTION TO MEDICATION 07/31/2009  . ANEMIA-NOS 07/07/2007  . Arthritis   . Chest pain, unspecified   . Chronic kidney disease    hx of kidney stone  . COLONIC POLYPS, ADENOMATOUS, HX OF   . Disturbance of skin sensation   . DIVERTICULOSIS, COLON 02/23/2009  . Family history of diabetes mellitus   . Family history of malignant neoplasm of breast   . FATIGUE 05/25/2009  . Fever, unspecified   . Heart murmur    hx of  . HYPERLIPIDEMIA 07/07/2007  . HYPERPARATHYROIDISM UNSPECIFIED 10/20/2007  . HYPERTENSION 02/04/2008  . NEPHROLITHIASIS, HX OF 11/15/2008  . OSTEOPOROSIS 01/01/2008  . Other acquired absence of organ    parathyroidectomy  . Pain in limb   . PALPITATIONS, OCCASIONAL 07/12/2008  . PARATHYROIDECTOMY 01/02/2009  . SCOLIOSIS 05/25/2009  . Seasonal allergies   . Unspecified constipation   . URINARY INCONTINENCE 07/07/2007  . UTI'S, RECURRENT 11/20/2009   kimbrough     Past Surgical History:  Procedure Laterality Date  . BUNIONECTOMY    . COLONOSCOPY    . LAPAROSCOPIC APPENDECTOMY  01/19/2016   Procedure: APPENDECTOMY LAPAROSCOPIC with orifice of cecal polyp;  Surgeon: Leighton Ruff, MD;  Location: [facility];   Service: General;;  . LEFT HEART CATH AND CORONARY ANGIOGRAPHY N/A 04/07/2017   Procedure: Left Heart Cath and Coronary Angiography;  Surgeon: Nelva Bush, MD;  Location: [place] [facility];  Service: Cardiovascular;  Laterality: N/A;  . PARATHYROIDECTOMY     Rt Superior open neck exploration  . POLYPECTOMY    . RIGHT OOPHORECTOMY    . TONSILLECTOMY      There were no vitals filed for this visit.      Subjective Assessment - 04/30/17 0934    Subjective Pt states she had a long night last night, but feels "OK" this morning   Pertinent History osteoporosis. T11 compression fracture s/p fall 02/06/17   Patient Stated Goals improve strength, regain mobility/strength,    Currently in Pain? Yes   Pain Score 5    Pain Location Thoracic   Pain Orientation Mid   Pain Descriptors / Indicators Burning   Pain Type Acute pain   Pain Onset More than a month ago                         [facility] Adult PT Treatment/Exercise - 04/30/17 0001      Shoulder Exercises: Supine   Horizontal ABduction Strengthening;Both;20 reps;Theraband   Theraband Level (Shoulder Horizontal ABduction) Level 1 (Yellow)   Other Supine Exercises diagonals x 10 yellow t band     Shoulder Exercises: Standing  Extension Strengthening;Both;10 reps;Theraband  2 x 10   Theraband Level (Shoulder Extension) Level 2 (Red)   Row Strengthening;Both;10 reps;Theraband  2 x 10   Theraband Level (Shoulder Row) Level 2 (Red)   Other Standing Exercises diagonals 2 x 10 yellow t band     Shoulder Exercises: ROM/Strengthening   UBE (Upper Arm Bike) nustep level 3 x 5 minutes  arms 6, seat, 6     Cryotherapy   Number Minutes Cryotherapy 15 Minutes   Cryotherapy Location Lumbar Spine   Type of Cryotherapy Ice pack     Electrical Stimulation   Electrical Stimulation Location thoracic spine   Electrical Stimulation Action IFC   Electrical Stimulation Parameters 15 mins   Electrical Stimulation Goals Pain                 PT Education - 04/30/17 1005    Education provided Yes   Education Details theraband exercises   Person(s) Educated Patient   Methods Explanation;Demonstration;Handout   Comprehension Returned demonstration;Verbalized understanding          PT Short Term Goals - 04/30/17 1000      PT SHORT TERM GOAL #1   Title be independent in initial HEP   Status On-going     PT SHORT TERM GOAL #2   Title verbalize and demonstrate postural modifications and body mechanics modifications needed for spinal protection to prevent future fracture   Status On-going     PT SHORT TERM GOAL #3   Title reduce pain to sit/stand/walk for > or = to 15 minutes without limitation   Status On-going     PT SHORT TERM GOAL #4   Title perform 5x sit to stand in < or = to 16 seconds to reduce risk of falls   Status On-going           PT Long Term Goals - 04/30/17 1001      PT LONG TERM GOAL #1   Title independent with HEP and how to progress herself   Status On-going     PT LONG TERM GOAL #2   Title reduce FOTO to < or = to 39% limitation   Status On-going               Plan - 04/30/17 0959    Clinical Impression Statement Pt with pain during exercise, states pain eases with rest. Pt states she feels better after ice and estim.  Pt will continue to benefit from skilled PT to decrease pain and improve strength and posture   PT Frequency 2x / week   PT Duration 8 weeks   PT Treatment/Interventions ADLs/Self Care Home Management;Cryotherapy;Electrical Stimulation;Functional mobility training;Stair training;Gait training;Ultrasound;Moist Heat;Therapeutic activities;Therapeutic exercise;Balance training;Neuromuscular re-education;Patient/family education;Manual techniques;Taping   PT Next Visit Plan postural strength, upper extremity strength, balance exercises, pain management   Consulted and Agree with Plan of Care Patient      Patient will benefit from skilled  therapeutic intervention in order to improve the following deficits and impairments:  Postural dysfunction, Decreased strength, Impaired flexibility, Improper body mechanics, Pain, Decreased activity tolerance, Decreased endurance, Increased muscle spasms, Impaired UE functional use  Visit Diagnosis: Muscle weakness (generalized)  Pain in thoracic spine  Abnormal posture     Problem List Patient Active Problem List   Diagnosis Date Noted  . Coronary artery disease involving native coronary artery of native heart without angina pectoris 04/28/2017  . Syncope 04/07/2017  . Shortness of breath 04/07/2017  . Abnormal stress test 04/07/2017  .  Sinus bradycardia 02/21/2017  . Coronary artery calcification 02/21/2017  . Compression fracture of thoracic vertebra ([place]) 02/08/2017  . Dizziness and giddiness 02/08/2017  . Atrial fibrillation with rapid ventricular response ([place]) 01/21/2016  . Colon polyp s/p appy/partialcecetomy 01/19/3016 01/19/2016  . Orthostatic dizziness 04/25/2014  . Vertigo 12/26/2012  . Hypertension, uncontrolled 09/11/2012  . Diastolic dysfunction 30/07/2329  . Sleep disturbance 09/17/2011  . Hypotension 12/26/2010  . Orthostatic hypotension 12/26/2010  . UTI'S, RECURRENT 11/20/2009  . ADVERSE REACTION TO MEDICATION 07/31/2009  . SCOLIOSIS 05/25/2009  . FATIGUE 05/25/2009  . DIVERTICULOSIS, COLON 02/23/2009  . COLONIC POLYPS, ADENOMATOUS, HX OF 02/23/2009  . PARATHYROIDECTOMY 01/02/2009  . NEPHROLITHIASIS, HX OF 11/15/2008  . PALPITATIONS, OCCASIONAL 07/12/2008  . Essential hypertension 02/04/2008  . Constipation 01/01/2008  . LEG PAIN, LEFT 01/01/2008  . OSTEOPOROSIS 01/01/2008  . HYPERPARATHYROIDISM UNSPECIFIED 10/20/2007  . HYPERLIPIDEMIA 07/07/2007  . ANEMIA-NOS 07/07/2007  . URINARY INCONTINENCE 07/07/2007    Isabelle Course, PT, DPT 04/30/2017, 10:06 AM  [facility] [address] [place], [place], [address] Phone: [phone]   Fax:  [phone]  Name: [name] MRN: [id number] Date of Birth: 04-26-1941

## 2017-05-02 ENCOUNTER — Ambulatory Visit: Payer: Medicare Other | Admitting: Physical Therapy

## 2017-05-02 ENCOUNTER — Encounter: Payer: Self-pay | Admitting: Physical Therapy

## 2017-05-02 DIAGNOSIS — R293 Abnormal posture: Secondary | ICD-10-CM | POA: Diagnosis not present

## 2017-05-02 DIAGNOSIS — M6281 Muscle weakness (generalized): Secondary | ICD-10-CM | POA: Diagnosis not present

## 2017-05-02 DIAGNOSIS — R279 Unspecified lack of coordination: Secondary | ICD-10-CM

## 2017-05-02 DIAGNOSIS — R296 Repeated falls: Secondary | ICD-10-CM | POA: Diagnosis not present

## 2017-05-02 DIAGNOSIS — M546 Pain in thoracic spine: Secondary | ICD-10-CM | POA: Diagnosis not present

## 2017-05-02 NOTE — Therapy (Signed)
[facility] [address] [place], [place], [address] Phone: [phone]   Fax:  [phone]  Physical Therapy Treatment  Patient Details  Name: [name] MRN: [id number] Date of Birth: [birth year] Referring Provider: Wandra Feinstein, MD  Encounter Date: 05/02/2017      PT End of Session - 05/02/17 0938    Visit Number 3   Number of Visits 10   Date for PT Re-Evaluation 2017-07-13   Authorization Type G-codes, KX at 73   PT Start Time 0931   PT Stop Time 1030   PT Time Calculation (min) 59 min   Activity Tolerance Patient tolerated treatment well   Behavior During Therapy [facility] for tasks assessed/performed      Past Medical History:  Diagnosis Date  . Abdominal pain, unspecified site   . ADVERSE REACTION TO MEDICATION 07/31/2009  . ANEMIA-NOS 07/07/2007  . Arthritis   . Chest pain, unspecified   . Chronic kidney disease    hx of kidney stone  . COLONIC POLYPS, ADENOMATOUS, HX OF   . Disturbance of skin sensation   . DIVERTICULOSIS, COLON 02/23/2009  . Family history of diabetes mellitus   . Family history of malignant neoplasm of breast   . FATIGUE 05/25/2009  . Fever, unspecified   . Heart murmur    hx of  . HYPERLIPIDEMIA 07/07/2007  . HYPERPARATHYROIDISM UNSPECIFIED 10/20/2007  . HYPERTENSION 02/04/2008  . NEPHROLITHIASIS, HX OF 11/15/2008  . OSTEOPOROSIS 01/01/2008  . Other acquired absence of organ    parathyroidectomy  . Pain in limb   . PALPITATIONS, OCCASIONAL 07/12/2008  . PARATHYROIDECTOMY 01/02/2009  . SCOLIOSIS 05/25/2009  . Seasonal allergies   . Unspecified constipation   . URINARY INCONTINENCE 07/07/2007  . UTI'S, RECURRENT 11/20/2009   kimbrough     Past Surgical History:  Procedure Laterality Date  . BUNIONECTOMY    . COLONOSCOPY    . LAPAROSCOPIC APPENDECTOMY  01/19/2016   Procedure: APPENDECTOMY LAPAROSCOPIC with orifice of cecal polyp;  Surgeon: Leighton Ruff, MD;  Location: [facility];   Service: General;;  . LEFT HEART CATH AND CORONARY ANGIOGRAPHY N/A 04/07/2017   Procedure: Left Heart Cath and Coronary Angiography;  Surgeon: Nelva Bush, MD;  Location: [place] [facility];  Service: Cardiovascular;  Laterality: N/A;  . PARATHYROIDECTOMY     Rt Superior open neck exploration  . POLYPECTOMY    . RIGHT OOPHORECTOMY    . TONSILLECTOMY      There were no vitals filed for this visit.      Subjective Assessment - 05/02/17 0939    Subjective I am hurting today.    Pertinent History osteoporosis. T11 compression fracture s/p fall 02/06/17   Limitations Standing;Sitting;Walking   How long can you sit comfortably? 10 minutes    How long can you stand comfortably? 10 minutes   How long can you walk comfortably? 10 minutes   Diagnostic tests x-ray to monitor healing   Patient Stated Goals improve strength, regain mobility/strength,    Currently in Pain? Yes   Pain Score 5    Pain Location Thoracic   Pain Orientation Mid   Pain Descriptors / Indicators Sore   Multiple Pain Sites No                         [facility] Adult PT Treatment/Exercise - 05/02/17 0001      Knee/Hip Exercises: Standing   Hip Abduction Stengthening;Both;1 set;10 reps;Knee straight  Abduction Limitations VC to contract quad/glute of standing leg   Hip Extension Stengthening;Both;1 set;10 reps;Knee straight     Shoulder Exercises: Supine   Horizontal ABduction Limitations Supine ball/glute contaction VC to not move pelvis/back 10x   Other Supine Exercises Decompression position  post standing  exs to decrease pain & fatigue. 3 min   Other Supine Exercises leg lengthener bil 3x      Shoulder Exercises: Standing   Horizontal ABduction Strengthening;Both;20 reps;Theraband   Theraband Level (Shoulder Horizontal ABduction) Level 1 (Yellow)  VC for posture and TA contraction   Row Strengthening;Both;10 reps;Theraband  2 x 10   Theraband Level (Shoulder Row) Level 2 (Red)   Other  Standing Exercises diagonals 2 x 10 yellow t band  VC for posture     Shoulder Exercises: ROM/Strengthening   UBE (Upper Arm Bike) nustep level 3 x 6 minutes  arms 6, seat, 6     Cryotherapy   Number Minutes Cryotherapy 15 Minutes   Cryotherapy Location Lumbar Spine   Type of Cryotherapy Ice pack     Electrical Stimulation   Electrical Stimulation Location thoracic spine   Electrical Stimulation Action IFC   Electrical Stimulation Parameters 15 min   Electrical Stimulation Goals Pain                  PT Short Term Goals - 04/30/17 1000      PT SHORT TERM GOAL #1   Title be independent in initial HEP   Status On-going     PT SHORT TERM GOAL #2   Title verbalize and demonstrate postural modifications and body mechanics modifications needed for spinal protection to prevent future fracture   Status On-going     PT SHORT TERM GOAL #3   Title reduce pain to sit/stand/walk for > or = to 15 minutes without limitation   Status On-going     PT SHORT TERM GOAL #4   Title perform 5x sit to stand in < or = to 16 seconds to reduce risk of falls   Status On-going           PT Long Term Goals - 04/30/17 1001      PT LONG TERM GOAL #1   Title independent with HEP and how to progress herself   Status On-going     PT LONG TERM GOAL #2   Title reduce FOTO to < or = to 39% limitation   Status On-going               Plan - 05/02/17 0942    Clinical Impression Statement Focus was primarily on pt recognizing her posture and being able to correct it as she performed an exercise.  She has trouble maintaining correct posture for any length of time due to poor muscluar strength and endurance.    Rehab Potential Good   PT Frequency 2x / week   PT Duration 8 weeks   PT Treatment/Interventions ADLs/Self Care Home Management;Cryotherapy;Electrical Stimulation;Functional mobility training;Stair training;Gait training;Ultrasound;Moist Heat;Therapeutic activities;Therapeutic  exercise;Balance training;Neuromuscular re-education;Patient/family education;Manual techniques;Taping   PT Next Visit Plan postural strength, upper extremity strength, balance exercises, pain management   Consulted and Agree with Plan of Care --      Patient will benefit from skilled therapeutic intervention in order to improve the following deficits and impairments:  Postural dysfunction, Decreased strength, Impaired flexibility, Improper body mechanics, Pain, Decreased activity tolerance, Decreased endurance, Increased muscle spasms, Impaired UE functional use, Decreased coordination  Visit Diagnosis: Muscle weakness (generalized)  Pain in thoracic spine  Abnormal posture  Repeated falls  Unspecified lack of coordination     Problem List Patient Active Problem List   Diagnosis Date Noted  . Coronary artery disease involving native coronary artery of native heart without angina pectoris 04/28/2017  . Syncope 04/07/2017  . Shortness of breath 04/07/2017  . Abnormal stress test 04/07/2017  . Sinus bradycardia 02/21/2017  . Coronary artery calcification 02/21/2017  . Compression fracture of thoracic vertebra ([place]) 02/08/2017  . Dizziness and giddiness 02/08/2017  . Atrial fibrillation with rapid ventricular response ([place]) 01/21/2016  . Colon polyp s/p appy/partialcecetomy 01/19/3016 01/19/2016  . Orthostatic dizziness 04/25/2014  . Vertigo 12/26/2012  . Hypertension, uncontrolled 09/11/2012  . Diastolic dysfunction 27/01/5008  . Sleep disturbance 09/17/2011  . Hypotension 12/26/2010  . Orthostatic hypotension 12/26/2010  . UTI'S, RECURRENT 11/20/2009  . ADVERSE REACTION TO MEDICATION 07/31/2009  . SCOLIOSIS 05/25/2009  . FATIGUE 05/25/2009  . DIVERTICULOSIS, COLON 02/23/2009  . COLONIC POLYPS, ADENOMATOUS, HX OF 02/23/2009  . PARATHYROIDECTOMY 01/02/2009  . NEPHROLITHIASIS, HX OF 11/15/2008  . PALPITATIONS, OCCASIONAL 07/12/2008  . Essential hypertension 02/04/2008  .  Constipation 01/01/2008  . LEG PAIN, LEFT 01/01/2008  . OSTEOPOROSIS 01/01/2008  . HYPERPARATHYROIDISM UNSPECIFIED 10/20/2007  . HYPERLIPIDEMIA 07/07/2007  . ANEMIA-NOS 07/07/2007  . URINARY INCONTINENCE 07/07/2007    COCHRAN,JENNIFER, PTA 05/02/2017, 10:17 AM  [facility] [address] [place], [place], [address] Phone: [phone]   Fax:  [phone]  Name: ANGELO CAROLL MRN: [id number] Date of Birth: [birth year]

## 2017-05-07 ENCOUNTER — Encounter: Payer: Self-pay | Admitting: Physical Therapy

## 2017-05-07 ENCOUNTER — Ambulatory Visit: Payer: Medicare Other | Admitting: Physical Therapy

## 2017-05-07 DIAGNOSIS — R279 Unspecified lack of coordination: Secondary | ICD-10-CM | POA: Diagnosis not present

## 2017-05-07 DIAGNOSIS — R293 Abnormal posture: Secondary | ICD-10-CM

## 2017-05-07 DIAGNOSIS — R296 Repeated falls: Secondary | ICD-10-CM

## 2017-05-07 DIAGNOSIS — M6281 Muscle weakness (generalized): Secondary | ICD-10-CM

## 2017-05-07 DIAGNOSIS — M546 Pain in thoracic spine: Secondary | ICD-10-CM | POA: Diagnosis not present

## 2017-05-07 NOTE — Therapy (Signed)
[facility] [address], [place], [place], [address] Phone: [phone]   Fax:  [phone]  Physical Therapy Treatment  Patient Details  Name: [name] MRN: [id number] Date of Birth: [birth year] Referring Provider: Wandra Feinstein, MD  Encounter Date: 05/07/2017      PT End of Session - 05/07/17 1150    Visit Number 4   Number of Visits 10   Date for PT Re-Evaluation 2017/07/13   Authorization Type G-codes, KX at 59   PT Start Time 1146   PT Stop Time 1227   PT Time Calculation (min) 41 min   Activity Tolerance Patient tolerated treatment well   Behavior During Therapy [facility] for tasks assessed/performed      Past Medical History:  Diagnosis Date  . Abdominal pain, unspecified site   . ADVERSE REACTION TO MEDICATION 07/31/2009  . ANEMIA-NOS 07/07/2007  . Arthritis   . Chest pain, unspecified   . Chronic kidney disease    hx of kidney stone  . COLONIC POLYPS, ADENOMATOUS, HX OF   . Disturbance of skin sensation   . DIVERTICULOSIS, COLON 02/23/2009  . Family history of diabetes mellitus   . Family history of malignant neoplasm of breast   . FATIGUE 05/25/2009  . Fever, unspecified   . Heart murmur    hx of  . HYPERLIPIDEMIA 07/07/2007  . HYPERPARATHYROIDISM UNSPECIFIED 10/20/2007  . HYPERTENSION 02/04/2008  . NEPHROLITHIASIS, HX OF 11/15/2008  . OSTEOPOROSIS 01/01/2008  . Other acquired absence of organ    parathyroidectomy  . Pain in limb   . PALPITATIONS, OCCASIONAL 07/12/2008  . PARATHYROIDECTOMY 01/02/2009  . SCOLIOSIS 05/25/2009  . Seasonal allergies   . Unspecified constipation   . URINARY INCONTINENCE 07/07/2007  . UTI'S, RECURRENT 11/20/2009   kimbrough     Past Surgical History:  Procedure Laterality Date  . BUNIONECTOMY    . COLONOSCOPY    . LAPAROSCOPIC APPENDECTOMY  01/19/2016   Procedure: APPENDECTOMY LAPAROSCOPIC with orifice of cecal polyp;  Surgeon: Leighton Ruff, MD;  Location: [facility];   Service: General;;  . LEFT HEART CATH AND CORONARY ANGIOGRAPHY N/A 04/07/2017   Procedure: Left Heart Cath and Coronary Angiography;  Surgeon: Nelva Bush, MD;  Location: [place] [facility];  Service: Cardiovascular;  Laterality: N/A;  . PARATHYROIDECTOMY     Rt Superior open neck exploration  . POLYPECTOMY    . RIGHT OOPHORECTOMY    . TONSILLECTOMY      There were no vitals filed for this visit.      Subjective Assessment - 05/07/17 1151    Subjective Felt good until last night. I bent over to plant a plant and this might have done it.     Pertinent History osteoporosis. T11 compression fracture s/p fall 02/06/17   Limitations Standing;Sitting;Walking   Currently in Pain? Yes   Pain Score 5    Pain Location Thoracic   Pain Orientation Mid   Pain Descriptors / Indicators Sore   Aggravating Factors  Not sure   Pain Relieving Factors Resting   Multiple Pain Sites No                         [facility] Adult PT Treatment/Exercise - 05/07/17 0001      High Level Balance   High Level Balance Comments Bil heels raises, reverse walking at [place]      Knee/Hip Exercises: Aerobic   Nustep L1 x 7 min  PT Apresent  to monitor, ice to back during     Knee/Hip Exercises: Standing   Hip Abduction AROM;Stengthening;Both;2 sets;10 reps;Knee straight   Other Standing Knee Exercises 3 min tall walking     Shoulder Exercises: Supine   Horizontal ABduction Strengthening;Both;20 reps;Theraband   Theraband Level (Shoulder Horizontal ABduction) Level 1 (Yellow)   Horizontal ABduction Limitations Supine ball/glute contaction VC to not move pelvis/back 10x   Other Supine Exercises Decompression position  post standing  exs to decrease pain & fatigue. 3 min   Other Supine Exercises leg lengthener bil 3x      Shoulder Exercises: Standing   Other Standing Exercises Lt sidebend with shoulder in flexion 3x      Shoulder Exercises: Stretch   Other Shoulder Stretches RT shoulder  flexion with slight trunk bend Lt 4 x 20 sec                  PT Short Term Goals - 05/07/17 1202      PT SHORT TERM GOAL #1   Title be independent in initial HEP   Time 4   Period Weeks   Status Achieved           PT Long Term Goals - 04/30/17 1001      PT LONG TERM GOAL #1   Title independent with HEP and how to progress herself   Status On-going     PT LONG TERM GOAL #2   Title reduce FOTO to < or = to 39% limitation   Status On-going               Plan - 05/07/17 1151    Clinical Impression Statement Pt is making a better attempt to more consistently stand erect. We discussed not bending forward at the wasit ever due to her osteporosis, pt unsure she could adhere to this 100%. Pain was absent post session. Balance was good with walking and reverse walking.     Rehab Potential Good   PT Frequency 2x / week   PT Duration 8 weeks   PT Treatment/Interventions ADLs/Self Care Home Management;Cryotherapy;Electrical Stimulation;Functional mobility training;Stair training;Gait training;Ultrasound;Moist Heat;Therapeutic activities;Therapeutic exercise;Balance training;Neuromuscular re-education;Patient/family education;Manual techniques;Taping   PT Next Visit Plan postural strength, upper extremity strength, balance exercises, pain management   Consulted and Agree with Plan of Care --      Patient will benefit from skilled therapeutic intervention in order to improve the following deficits and impairments:  Postural dysfunction, Decreased strength, Impaired flexibility, Improper body mechanics, Pain, Decreased activity tolerance, Decreased endurance, Increased muscle spasms, Impaired UE functional use, Decreased coordination  Visit Diagnosis: Muscle weakness (generalized)  Pain in thoracic spine  Abnormal posture  Repeated falls  Unspecified lack of coordination     Problem List Patient Active Problem List   Diagnosis Date Noted  . Coronary artery  disease involving native coronary artery of native heart without angina pectoris 04/28/2017  . Syncope 04/07/2017  . Shortness of breath 04/07/2017  . Abnormal stress test 04/07/2017  . Sinus bradycardia 02/21/2017  . Coronary artery calcification 02/21/2017  . Compression fracture of thoracic vertebra ([place]) 02/08/2017  . Dizziness and giddiness 02/08/2017  . Atrial fibrillation with rapid ventricular response ([place]) 01/21/2016  . Colon polyp s/p appy/partialcecetomy 01/19/3016 01/19/2016  . Orthostatic dizziness 04/25/2014  . Vertigo 12/26/2012  . Hypertension, uncontrolled 09/11/2012  . Diastolic dysfunction 40/98/1191  . Sleep disturbance 09/17/2011  . Hypotension 12/26/2010  . Orthostatic hypotension 12/26/2010  . UTI'S, RECURRENT 11/20/2009  . ADVERSE REACTION TO  MEDICATION 07/31/2009  . SCOLIOSIS 05/25/2009  . FATIGUE 05/25/2009  . DIVERTICULOSIS, COLON 02/23/2009  . COLONIC POLYPS, ADENOMATOUS, HX OF 02/23/2009  . PARATHYROIDECTOMY 01/02/2009  . NEPHROLITHIASIS, HX OF 11/15/2008  . PALPITATIONS, OCCASIONAL 07/12/2008  . Essential hypertension 02/04/2008  . Constipation 01/01/2008  . LEG PAIN, LEFT 01/01/2008  . OSTEOPOROSIS 01/01/2008  . HYPERPARATHYROIDISM UNSPECIFIED 10/20/2007  . HYPERLIPIDEMIA 07/07/2007  . ANEMIA-NOS 07/07/2007  . URINARY INCONTINENCE 07/07/2007    COCHRAN,JENNIFER, PTA 05/07/2017, 12:30 PM  [facility] [address], [place], [place], [address] Phone: [phone]   Fax:  [phone]  Name: [name] MRN: [id number] Date of Birth: January 14, 1941

## 2017-05-09 ENCOUNTER — Encounter: Payer: Self-pay | Admitting: Physical Therapy

## 2017-05-09 ENCOUNTER — Ambulatory Visit: Payer: Medicare Other | Admitting: Physical Therapy

## 2017-05-09 DIAGNOSIS — R296 Repeated falls: Secondary | ICD-10-CM | POA: Diagnosis not present

## 2017-05-09 DIAGNOSIS — M6281 Muscle weakness (generalized): Secondary | ICD-10-CM | POA: Diagnosis not present

## 2017-05-09 DIAGNOSIS — R279 Unspecified lack of coordination: Secondary | ICD-10-CM

## 2017-05-09 DIAGNOSIS — R293 Abnormal posture: Secondary | ICD-10-CM | POA: Diagnosis not present

## 2017-05-09 DIAGNOSIS — M546 Pain in thoracic spine: Secondary | ICD-10-CM | POA: Diagnosis not present

## 2017-05-09 NOTE — Therapy (Signed)
[facility] [address] [place], [place], [address] Phone: [phone]   Fax:  [phone]  Physical Therapy Treatment  Patient Details  Name: SYANA DEGRAFFENREID MRN: [id number] Date of Birth: October 04, 1941 Referring Provider: Wandra Feinstein, MD  Encounter Date: 05/09/2017      PT End of Session - 05/09/17 0850    Visit Number 5   Number of Visits 10   Date for PT Re-Evaluation July 05, 2017   Authorization Type G-codes, KX at 47   PT Start Time 0845   PT Stop Time 0925   PT Time Calculation (min) 40 min   Activity Tolerance Patient tolerated treatment well   Behavior During Therapy [facility] for tasks assessed/performed      Past Medical History:  Diagnosis Date  . Abdominal pain, unspecified site   . ADVERSE REACTION TO MEDICATION 07/31/2009  . ANEMIA-NOS 07/07/2007  . Arthritis   . Chest pain, unspecified   . Chronic kidney disease    hx of kidney stone  . COLONIC POLYPS, ADENOMATOUS, HX OF   . Disturbance of skin sensation   . DIVERTICULOSIS, COLON 02/23/2009  . Family history of diabetes mellitus   . Family history of malignant neoplasm of breast   . FATIGUE 05/25/2009  . Fever, unspecified   . Heart murmur    hx of  . HYPERLIPIDEMIA 07/07/2007  . HYPERPARATHYROIDISM UNSPECIFIED 10/20/2007  . HYPERTENSION 02/04/2008  . NEPHROLITHIASIS, HX OF 11/15/2008  . OSTEOPOROSIS 01/01/2008  . Other acquired absence of organ    parathyroidectomy  . Pain in limb   . PALPITATIONS, OCCASIONAL 07/12/2008  . PARATHYROIDECTOMY 01/02/2009  . SCOLIOSIS 05/25/2009  . Seasonal allergies   . Unspecified constipation   . URINARY INCONTINENCE 07/07/2007  . UTI'S, RECURRENT 11/20/2009   kimbrough     Past Surgical History:  Procedure Laterality Date  . BUNIONECTOMY    . COLONOSCOPY    . LAPAROSCOPIC APPENDECTOMY  01/19/2016   Procedure: APPENDECTOMY LAPAROSCOPIC with orifice of cecal polyp;  Surgeon: Leighton Ruff, MD;  [facility];   Service: General;;  . LEFT HEART CATH AND CORONARY ANGIOGRAPHY N/A 04/07/2017   Procedure: Left Heart Cath and Coronary Angiography;  Surgeon: Nelva Bush, MD;  Location: [place] [facility];  Service: Cardiovascular;  Laterality: N/A;  . PARATHYROIDECTOMY     Rt Superior open neck exploration  . POLYPECTOMY    . RIGHT OOPHORECTOMY    . TONSILLECTOMY      There were no vitals filed for this visit.      Subjective Assessment - 05/09/17 0849    Subjective I have no pain in the morning. Got a pillow for my car.   Pertinent History osteoporosis. T11 compression fracture s/p fall 02/06/17   Currently in Pain? No/denies   Multiple Pain Sites No                         [facility] Adult PT Treatment/Exercise - 05/09/17 0001      Knee/Hip Exercises: Aerobic   Nustep L2 x 8 min  ice to back PTA present     Knee/Hip Exercises: Standing   Hip Abduction AROM;Stengthening;Both;2 sets;10 reps;Knee straight  VC for posture & TA   Hip Extension AROM;Stengthening;Both;2 sets;10 reps;Knee straight  VC for posture and TA contraction   Other Standing Knee Exercises 3 min tall walking     Knee/Hip Exercises: Seated   Sit to Sand --  Modified yoga extension stretch upon  standing 15x     Knee/Hip Exercises: Supine   Bridges with [employer] --  6x, then with hip abd red band 6x   Other Supine Knee/Hip Exercises Decompression psot 3 min walk 2 min  VC for breathing     Shoulder Exercises: Seated   Horizontal ABduction Strengthening;Both;Theraband   Theraband Level (Shoulder Horizontal ABduction) Level 1 (Yellow)  3 x 10 with VC for posture   External Rotation Strengthening;Both;20 reps;Theraband   Theraband Level (Shoulder External Rotation) Level 1 (Yellow)                  PT Short Term Goals - 05/09/17 0906      PT SHORT TERM GOAL #2   Title verbalize and demonstrate postural modifications and body mechanics modifications needed for spinal protection to  prevent future fracture   Time 4   Period Weeks   Status Achieved     PT SHORT TERM GOAL #3   Title reduce pain to sit/stand/walk for > or = to 15 minutes without limitation   Time 4   Period Weeks   Status On-going     PT SHORT TERM GOAL #4   Title perform 5x sit to stand in < or = to 16 seconds to reduce risk of falls   Time 5   Period Weeks   Status Achieved  11 sec           PT Long Term Goals - 05/09/17 0908      PT LONG TERM GOAL #6   Title perform 5x sit to stand in < or = to 13 seconds to reduce risk of falls   Time 8   Period Weeks   Status --  11 sec               Plan - 05/09/17 0850    Clinical Impression Statement Pt presents this AM without pain and an excellent effort to wright her posture and stand tall. Pt able to tolerate an increase in resps/sets/and resistance today without pain. Discussed walking plan with pt as she would like to be able to walk around her block. Pt met her sit to stand goal today decreasing time to 11 sec.     Rehab Potential Good   PT Frequency 2x / week   PT Duration 8 weeks   PT Treatment/Interventions ADLs/Self Care Home Management;Cryotherapy;Electrical Stimulation;Functional mobility training;Stair training;Gait training;Ultrasound;Moist Heat;Therapeutic activities;Therapeutic exercise;Balance training;Neuromuscular re-education;Patient/family education;Manual techniques;Taping   PT Next Visit Plan postural strength, upper extremity strength, balance exercises, pain management   Consulted and Agree with Plan of Care Patient      Patient will benefit from skilled therapeutic intervention in order to improve the following deficits and impairments:  Postural dysfunction, Decreased strength, Impaired flexibility, Improper body mechanics, Pain, Decreased activity tolerance, Decreased endurance, Increased muscle spasms, Impaired UE functional use, Decreased coordination  Visit Diagnosis: Muscle weakness  (generalized)  Pain in thoracic spine  Abnormal posture  Repeated falls  Unspecified lack of coordination     Problem List Patient Active Problem List   Diagnosis Date Noted  . Coronary artery disease involving native coronary artery of native heart without angina pectoris 04/28/2017  . Syncope 04/07/2017  . Shortness of breath 04/07/2017  . Abnormal stress test 04/07/2017  . Sinus bradycardia 02/21/2017  . Coronary artery calcification 02/21/2017  . Compression fracture of thoracic vertebra ([place]) 02/08/2017  . Dizziness and giddiness 02/08/2017  . Atrial fibrillation with rapid ventricular response ([place]) 01/21/2016  .  Colon polyp s/p appy/partialcecetomy 01/19/3016 01/19/2016  . Orthostatic dizziness 04/25/2014  . Vertigo 12/26/2012  . Hypertension, uncontrolled 09/11/2012  . Diastolic dysfunction 74/82/7078  . Sleep disturbance 09/17/2011  . Hypotension 12/26/2010  . Orthostatic hypotension 12/26/2010  . UTI'S, RECURRENT 11/20/2009  . ADVERSE REACTION TO MEDICATION 07/31/2009  . SCOLIOSIS 05/25/2009  . FATIGUE 05/25/2009  . DIVERTICULOSIS, COLON 02/23/2009  . COLONIC POLYPS, ADENOMATOUS, HX OF 02/23/2009  . PARATHYROIDECTOMY 01/02/2009  . NEPHROLITHIASIS, HX OF 11/15/2008  . PALPITATIONS, OCCASIONAL 07/12/2008  . Essential hypertension 02/04/2008  . Constipation 01/01/2008  . LEG PAIN, LEFT 01/01/2008  . OSTEOPOROSIS 01/01/2008  . HYPERPARATHYROIDISM UNSPECIFIED 10/20/2007  . HYPERLIPIDEMIA 07/07/2007  . ANEMIA-NOS 07/07/2007  . URINARY INCONTINENCE 07/07/2007    Mauriana Dann, PTA 05/09/2017, 9:24 AM  [facility] [address] [place], [place], [address] Phone: [phone]   Fax:  [phone]  Name: MELENA HAYES MRN: [id number] Date of Birth: [birth year]  Sit to stand 5x in 11 sec.

## 2017-05-14 ENCOUNTER — Encounter: Payer: Self-pay | Admitting: Physical Therapy

## 2017-05-14 ENCOUNTER — Ambulatory Visit: Payer: Medicare Other | Admitting: Physical Therapy

## 2017-05-14 DIAGNOSIS — M546 Pain in thoracic spine: Secondary | ICD-10-CM

## 2017-05-14 DIAGNOSIS — R296 Repeated falls: Secondary | ICD-10-CM | POA: Diagnosis not present

## 2017-05-14 DIAGNOSIS — R293 Abnormal posture: Secondary | ICD-10-CM

## 2017-05-14 DIAGNOSIS — R279 Unspecified lack of coordination: Secondary | ICD-10-CM | POA: Diagnosis not present

## 2017-05-14 DIAGNOSIS — M6281 Muscle weakness (generalized): Secondary | ICD-10-CM

## 2017-05-14 NOTE — Therapy (Signed)
[facility] [address], [place], [place], [address] Phone: [phone]   Fax:  [phone]  Physical Therapy Treatment  Patient Details  Name: [name] MRN: [id number] Date of Birth: September 08, 1941 Referring Provider: Wandra Feinstein, MD  Encounter Date: 05/14/2017      PT End of Session - 05/14/17 1055    Visit Number 6   Number of Visits 10   Date for PT Re-Evaluation 07/12/17   Authorization Type G-codes, KX at 59   PT Start Time 1020   PT Stop Time 1055   PT Time Calculation (min) 35 min   Activity Tolerance Patient tolerated treatment well   Behavior During Therapy [facility] for tasks assessed/performed      Past Medical History:  Diagnosis Date  . Abdominal pain, unspecified site   . ADVERSE REACTION TO MEDICATION 07/31/2009  . ANEMIA-NOS 07/07/2007  . Arthritis   . Chest pain, unspecified   . Chronic kidney disease    hx of kidney stone  . COLONIC POLYPS, ADENOMATOUS, HX OF   . Disturbance of skin sensation   . DIVERTICULOSIS, COLON 02/23/2009  . Family history of diabetes mellitus   . Family history of malignant neoplasm of breast   . FATIGUE 05/25/2009  . Fever, unspecified   . Heart murmur    hx of  . HYPERLIPIDEMIA 07/07/2007  . HYPERPARATHYROIDISM UNSPECIFIED 10/20/2007  . HYPERTENSION 02/04/2008  . NEPHROLITHIASIS, HX OF 11/15/2008  . OSTEOPOROSIS 01/01/2008  . Other acquired absence of organ    parathyroidectomy  . Pain in limb   . PALPITATIONS, OCCASIONAL 07/12/2008  . PARATHYROIDECTOMY 01/02/2009  . SCOLIOSIS 05/25/2009  . Seasonal allergies   . Unspecified constipation   . URINARY INCONTINENCE 07/07/2007  . UTI'S, RECURRENT 11/20/2009   kimbrough     Past Surgical History:  Procedure Laterality Date  . BUNIONECTOMY    . COLONOSCOPY    . LAPAROSCOPIC APPENDECTOMY  01/19/2016   Procedure: APPENDECTOMY LAPAROSCOPIC with orifice of cecal polyp;  Surgeon: Leighton Ruff, MD;  [facility];   Service: General;;  . LEFT HEART CATH AND CORONARY ANGIOGRAPHY N/A 04/07/2017   Procedure: Left Heart Cath and Coronary Angiography;  Surgeon: Nelva Bush, MD;  Location: [place] [facility];  Service: Cardiovascular;  Laterality: N/A;  . PARATHYROIDECTOMY     Rt Superior open neck exploration  . POLYPECTOMY    . RIGHT OOPHORECTOMY    . TONSILLECTOMY      There were no vitals filed for this visit.      Subjective Assessment - 05/14/17 1025    Subjective my pain is "just annoying. It seems like it won't ever go away"   Pertinent History osteoporosis. T11 compression fracture s/p fall 02/06/17   Patient Stated Goals improve strength, regain mobility/strength,    Currently in Pain? Yes   Pain Score 2    Pain Location Thoracic   Pain Orientation Mid   Pain Descriptors / Indicators Sore   Pain Type Acute pain   Pain Onset More than a month ago                         [facility] Adult PT Treatment/Exercise - 05/14/17 0001      Knee/Hip Exercises: Stretches   Other Knee/Hip Stretches single knee to chest 2 x 30 sec, double knee to chest x 30 sec     Knee/Hip Exercises: Aerobic   Nustep L4 x 6 min  Knee/Hip Exercises: Standing   Hip Abduction AROM;Stengthening;Both;2 sets;10 reps  cues for posture and TA contraction   Hip Extension AROM;Stengthening;Both;2 sets;10 reps  vc's for posture and TA contraction   Other Standing Knee Exercises 3 min tall walking     Knee/Hip Exercises: Supine   Bridges with Ball Squeeze 20 reps  focus on breathing and eccentric control   Bridges with Cl[employer] 10 reps     Shoulder Exercises: Seated   Horizontal ABduction Strengthening;Both;20 reps   Theraband Level (Shoulder Horizontal ABduction) Level 1 (Yellow)   External Rotation Strengthening;Both;20 reps   Theraband Level (Shoulder External Rotation) Level 1 (Yellow)     Shoulder Exercises: Standing   Other Standing Exercises "snow angels" against the wall x 10 to  tolerance   Other Standing Exercises shoulders back to the wall x 5     Shoulder Exercises: Stretch   Other Shoulder Stretches sidebending with reach overhead x 10 bilat                  PT Short Term Goals - 05/14/17 1056      PT SHORT TERM GOAL #1   Title be independent in initial HEP   Status Achieved     PT SHORT TERM GOAL #2   Title verbalize and demonstrate postural modifications and body mechanics modifications needed for spinal protection to prevent future fracture   Status Achieved     PT SHORT TERM GOAL #3   Title reduce pain to sit/stand/walk for > or = to 15 minutes without limitation   Status On-going     PT SHORT TERM GOAL #4   Title perform 5x sit to stand in < or = to 16 seconds to reduce risk of falls   Status Achieved           PT Long Term Goals - 05/14/17 1056      PT LONG TERM GOAL #1   Title independent with HEP and how to progress herself   Status On-going     PT LONG TERM GOAL #2   Title reduce FOTO to < or = to 39% limitation   Status On-going     PT LONG TERM GOAL #3   Title return to walking for exercise 20-30 minutes without limitation due to pain   Status On-going     PT LONG TERM GOAL #4   Title reduce pain to sit/stand/walk for > or = to 20-25 minutes without limitation   Status On-going     PT LONG TERM GOAL #5   Title demonstrate > or = to 4+/5 bil UE strength to improve endurance for cooking tasks   Status On-going     PT LONG TERM GOAL #6   Title perform 5x sit to stand in < or = to 13 seconds to reduce risk of falls   Status Achieved               Plan - 05/14/17 1055    Clinical Impression Statement Pt tolerated treatment well. difficulty with stretching against wall, still needs cues for posture with gait   Rehab Potential Good   PT Frequency 2x / week   PT Duration 8 weeks   PT Treatment/Interventions ADLs/Self Care Home Management;Cryotherapy;Electrical Stimulation;Functional mobility  training;Stair training;Gait training;Ultrasound;Moist Heat;Therapeutic activities;Therapeutic exercise;Balance training;Neuromuscular re-education;Patient/family education;Manual techniques;Taping   PT Next Visit Plan postural strength, upper extremity strength, balance exercises, pain management   Consulted and Agree with Plan of Care Patient  Patient will benefit from skilled therapeutic intervention in order to improve the following deficits and impairments:  Postural dysfunction, Decreased strength, Impaired flexibility, Improper body mechanics, Pain, Decreased activity tolerance, Decreased endurance, Increased muscle spasms, Impaired UE functional use, Decreased coordination  Visit Diagnosis: Muscle weakness (generalized)  Pain in thoracic spine  Abnormal posture     Problem List Patient Active Problem List   Diagnosis Date Noted  . Coronary artery disease involving native coronary artery of native heart without angina pectoris 04/28/2017  . Syncope 04/07/2017  . Shortness of breath 04/07/2017  . Abnormal stress test 04/07/2017  . Sinus bradycardia 02/21/2017  . Coronary artery calcification 02/21/2017  . Compression fracture of thoracic vertebra ([place]) 02/08/2017  . Dizziness and giddiness 02/08/2017  . Atrial fibrillation with rapid ventricular response ([place]) 01/21/2016  . Colon polyp s/p appy/partialcecetomy 01/19/3016 01/19/2016  . Orthostatic dizziness 04/25/2014  . Vertigo 12/26/2012  . Hypertension, uncontrolled 09/11/2012  . Diastolic dysfunction 79/15/0569  . Sleep disturbance 09/17/2011  . Hypotension 12/26/2010  . Orthostatic hypotension 12/26/2010  . UTI'S, RECURRENT 11/20/2009  . ADVERSE REACTION TO MEDICATION 07/31/2009  . SCOLIOSIS 05/25/2009  . FATIGUE 05/25/2009  . DIVERTICULOSIS, COLON 02/23/2009  . COLONIC POLYPS, ADENOMATOUS, HX OF 02/23/2009  . PARATHYROIDECTOMY 01/02/2009  . NEPHROLITHIASIS, HX OF 11/15/2008  . PALPITATIONS, OCCASIONAL  07/12/2008  . Essential hypertension 02/04/2008  . Constipation 01/01/2008  . LEG PAIN, LEFT 01/01/2008  . OSTEOPOROSIS 01/01/2008  . HYPERPARATHYROIDISM UNSPECIFIED 10/20/2007  . HYPERLIPIDEMIA 07/07/2007  . ANEMIA-NOS 07/07/2007  . URINARY INCONTINENCE 07/07/2007    Isabelle Course, PT, DPT 05/14/2017, 10:57 AM  [facility] [address] [place] [address] [place], [place], [address] Phone: [phone]   Fax:  [phone]  Name: [name] MRN: [id number] Date of Birth: 09-04-1941

## 2017-05-16 ENCOUNTER — Encounter: Payer: Medicare Other | Admitting: Physical Therapy

## 2017-05-20 ENCOUNTER — Ambulatory Visit: Payer: Medicare Other | Attending: Sports Medicine

## 2017-05-20 DIAGNOSIS — M546 Pain in thoracic spine: Secondary | ICD-10-CM | POA: Diagnosis not present

## 2017-05-20 DIAGNOSIS — R293 Abnormal posture: Secondary | ICD-10-CM

## 2017-05-20 DIAGNOSIS — M6281 Muscle weakness (generalized): Secondary | ICD-10-CM

## 2017-05-20 DIAGNOSIS — R279 Unspecified lack of coordination: Secondary | ICD-10-CM | POA: Insufficient documentation

## 2017-05-20 DIAGNOSIS — R296 Repeated falls: Secondary | ICD-10-CM | POA: Diagnosis not present

## 2017-05-20 NOTE — Therapy (Signed)
[facility] [address] [place], [place], [address] Phone: [phone]   Fax:  [phone]  Physical Therapy Treatment  Patient Details  Name: [name] MRN: [id number] Date of Birth: March 05, 1941 Referring Provider: Wandra Feinstein, MD  Encounter Date: 05/20/2017      PT End of Session - 05/20/17 1222    Visit Number 7   Number of Visits 10   Date for PT Re-Evaluation 06-27-2017   Authorization Type G-codes, KX at 85   PT Start Time 1147   PT Stop Time 1226   PT Time Calculation (min) 39 min   Activity Tolerance Patient tolerated treatment well   Behavior During Therapy [facility] for tasks assessed/performed      Past Medical History:  Diagnosis Date  . Abdominal pain, unspecified site   . ADVERSE REACTION TO MEDICATION 07/31/2009  . ANEMIA-NOS 07/07/2007  . Arthritis   . Chest pain, unspecified   . Chronic kidney disease    hx of kidney stone  . COLONIC POLYPS, ADENOMATOUS, HX OF   . Disturbance of skin sensation   . DIVERTICULOSIS, COLON 02/23/2009  . Family history of diabetes mellitus   . Family history of malignant neoplasm of breast   . FATIGUE 05/25/2009  . Fever, unspecified   . Heart murmur    hx of  . HYPERLIPIDEMIA 07/07/2007  . HYPERPARATHYROIDISM UNSPECIFIED 10/20/2007  . HYPERTENSION 02/04/2008  . NEPHROLITHIASIS, HX OF 11/15/2008  . OSTEOPOROSIS 01/01/2008  . Other acquired absence of organ    parathyroidectomy  . Pain in limb   . PALPITATIONS, OCCASIONAL 07/12/2008  . PARATHYROIDECTOMY 01/02/2009  . SCOLIOSIS 05/25/2009  . Seasonal allergies   . Unspecified constipation   . URINARY INCONTINENCE 07/07/2007  . UTI'S, RECURRENT 11/20/2009   kimbrough     Past Surgical History:  Procedure Laterality Date  . BUNIONECTOMY    . COLONOSCOPY    . LAPAROSCOPIC APPENDECTOMY  01/19/2016   Procedure: APPENDECTOMY LAPAROSCOPIC with orifice of cecal polyp;  Surgeon: Leighton Ruff, MD;  Location: [facility];   Service: General;;  . LEFT HEART CATH AND CORONARY ANGIOGRAPHY N/A 04/07/2017   Procedure: Left Heart Cath and Coronary Angiography;  Surgeon: Nelva Bush, MD;  Location: [facility];  Service: Cardiovascular;  Laterality: N/A;  . PARATHYROIDECTOMY     Rt Superior open neck exploration  . POLYPECTOMY    . RIGHT OOPHORECTOMY    . TONSILLECTOMY      There were no vitals filed for this visit.      Subjective Assessment - 05/20/17 1155    Pertinent History osteoporosis. T11 compression fracture s/p fall 02/06/17   Patient Stated Goals improve strength, regain mobility/strength,    Currently in Pain? Yes   Pain Score 2    Pain Location Thoracic   Pain Orientation Mid   Pain Descriptors / Indicators Sore   Pain Type Acute pain   Pain Onset More than a month ago   Pain Frequency Constant   Aggravating Factors  activity   Pain Relieving Factors ice, rest                         [facility] Adult PT Treatment/Exercise - 05/20/17 0001      Knee/Hip Exercises: Aerobic   Nustep L4 x 8 min     Knee/Hip Exercises: Standing   Hip Abduction AROM;Stengthening;Both;2 sets;10 reps  cues for posture and TA contraction   Hip Extension AROM;Stengthening;Both;2 sets;10  reps  vc's for posture and TA contraction     Shoulder Exercises: Supine   Other Supine Exercises supine: decompression series.  Difficult for patient due to thoracic pain in supine     Shoulder Exercises: Seated   Horizontal ABduction Strengthening;Both;20 reps   Theraband Level (Shoulder Horizontal ABduction) Level 1 (Yellow)   External Rotation Strengthening;Both;20 reps   Theraband Level (Shoulder External Rotation) Level 1 (Yellow)     Shoulder Exercises: ROM/Strengthening   UBE (Upper Arm Bike) Level 0x 6 minutes (3/3)                  PT Short Term Goals - 05/20/17 1201      PT SHORT TERM GOAL #1   Title be independent in initial HEP   Status Achieved     PT SHORT TERM GOAL #2    Title verbalize and demonstrate postural modifications and body mechanics modifications needed for spinal protection to prevent future fracture   Status Achieved     PT SHORT TERM GOAL #3   Title reduce pain to sit/stand/walk for > or = to 15 minutes without limitation   Baseline 30 minutes   Status Achieved     PT SHORT TERM GOAL #4   Title perform 5x sit to stand in < or = to 16 seconds to reduce risk of falls   Status Achieved           PT Long Term Goals - 05/20/17 1202      PT LONG TERM GOAL #3   Title return to walking for exercise 20-30 minutes without limitation due to pain   Time 8   Period Weeks   Status On-going               Plan - 05/20/17 1204    Clinical Impression Statement Pt with improved tolerance for sitting, standing and walking up to 30 minutes.  Pt with continued thoracic pain with all activity.  Pt tolerated addition of decompression exercises today.  Pt will continue to benefit from skilled PT for strength, endurance and postural exercises.     Rehab Potential Good   PT Frequency 2x / week   PT Treatment/Interventions ADLs/Self Care Home Management;Cryotherapy;Electrical Stimulation;Functional mobility training;Stair training;Gait training;Ultrasound;Moist Heat;Therapeutic activities;Therapeutic exercise;Balance training;Neuromuscular re-education;Patient/family education;Manual techniques;Taping   PT Next Visit Plan postural strength, upper extremity strength, balance exercises, pain management   Consulted and Agree with Plan of Care Patient      Patient will benefit from skilled therapeutic intervention in order to improve the following deficits and impairments:  Postural dysfunction, Decreased strength, Impaired flexibility, Improper body mechanics, Pain, Decreased activity tolerance, Decreased endurance, Increased muscle spasms, Impaired UE functional use, Decreased coordination  Visit Diagnosis: Muscle weakness (generalized)  Pain in  thoracic spine  Abnormal posture     Problem List Patient Active Problem List   Diagnosis Date Noted  . Coronary artery disease involving native coronary artery of native heart without angina pectoris 04/28/2017  . Syncope 04/07/2017  . Shortness of breath 04/07/2017  . Abnormal stress test 04/07/2017  . Sinus bradycardia 02/21/2017  . Coronary artery calcification 02/21/2017  . Compression fracture of thoracic vertebra ([place]) 02/08/2017  . Dizziness and giddiness 02/08/2017  . Atrial fibrillation with rapid ventricular response ([place]) 01/21/2016  . Colon polyp s/p appy/partialcecetomy 01/19/3016 01/19/2016  . Orthostatic dizziness 04/25/2014  . Vertigo 12/26/2012  . Hypertension, uncontrolled 09/11/2012  . Diastolic dysfunction 40/98/1191  . Sleep disturbance 09/17/2011  . Hypotension 12/26/2010  .  Orthostatic hypotension 12/26/2010  . UTI'S, RECURRENT 11/20/2009  . ADVERSE REACTION TO MEDICATION 07/31/2009  . SCOLIOSIS 05/25/2009  . FATIGUE 05/25/2009  . DIVERTICULOSIS, COLON 02/23/2009  . COLONIC POLYPS, ADENOMATOUS, HX OF 02/23/2009  . PARATHYROIDECTOMY 01/02/2009  . NEPHROLITHIASIS, HX OF 11/15/2008  . PALPITATIONS, OCCASIONAL 07/12/2008  . Essential hypertension 02/04/2008  . Constipation 01/01/2008  . LEG PAIN, LEFT 01/01/2008  . OSTEOPOROSIS 01/01/2008  . HYPERPARATHYROIDISM UNSPECIFIED 10/20/2007  . HYPERLIPIDEMIA 07/07/2007  . ANEMIA-NOS 07/07/2007  . URINARY INCONTINENCE 07/07/2007    Sigurd Sos, PT 05/20/17 12:23 PM  [facility] [address] [place], [place], [address] Phone: [phone]   Fax:  [phone]  Name: [name] MRN: [id number] Date of Birth: [birth year]

## 2017-05-23 ENCOUNTER — Encounter: Payer: Medicare Other | Admitting: Physical Therapy

## 2017-05-23 ENCOUNTER — Encounter: Payer: Self-pay | Admitting: Physical Therapy

## 2017-05-23 ENCOUNTER — Ambulatory Visit: Payer: Medicare Other | Admitting: Physical Therapy

## 2017-05-23 DIAGNOSIS — M6281 Muscle weakness (generalized): Secondary | ICD-10-CM

## 2017-05-23 DIAGNOSIS — M546 Pain in thoracic spine: Secondary | ICD-10-CM

## 2017-05-23 DIAGNOSIS — R296 Repeated falls: Secondary | ICD-10-CM | POA: Diagnosis not present

## 2017-05-23 DIAGNOSIS — R279 Unspecified lack of coordination: Secondary | ICD-10-CM | POA: Diagnosis not present

## 2017-05-23 DIAGNOSIS — R293 Abnormal posture: Secondary | ICD-10-CM | POA: Diagnosis not present

## 2017-05-23 NOTE — Therapy (Signed)
[facility] [address] [place], [place], [address] Phone: [phone]   Fax:  [phone]  Physical Therapy Treatment  Patient Details  Name: [name] MRN: [id number] Date of Birth: 1940/12/17 Referring Provider: Wandra Feinstein, MD  Encounter Date: 05/23/2017      PT End of Session - 05/23/17 1018    Visit Number 8   Number of Visits 10   Date for PT Re-Evaluation 2017/06/24   Authorization Type G-codes, KX at 40   PT Start Time 1018   PT Stop Time 1058   PT Time Calculation (min) 40 min   Activity Tolerance Patient tolerated treatment well   Behavior During Therapy [facility] for tasks assessed/performed      Past Medical History:  Diagnosis Date  . Abdominal pain, unspecified site   . ADVERSE REACTION TO MEDICATION 07/31/2009  . ANEMIA-NOS 07/07/2007  . Arthritis   . Chest pain, unspecified   . Chronic kidney disease    hx of kidney stone  . COLONIC POLYPS, ADENOMATOUS, HX OF   . Disturbance of skin sensation   . DIVERTICULOSIS, COLON 02/23/2009  . Family history of diabetes mellitus   . Family history of malignant neoplasm of breast   . FATIGUE 05/25/2009  . Fever, unspecified   . Heart murmur    hx of  . HYPERLIPIDEMIA 07/07/2007  . HYPERPARATHYROIDISM UNSPECIFIED 10/20/2007  . HYPERTENSION 02/04/2008  . NEPHROLITHIASIS, HX OF 11/15/2008  . OSTEOPOROSIS 01/01/2008  . Other acquired absence of organ    parathyroidectomy  . Pain in limb   . PALPITATIONS, OCCASIONAL 07/12/2008  . PARATHYROIDECTOMY 01/02/2009  . SCOLIOSIS 05/25/2009  . Seasonal allergies   . Unspecified constipation   . URINARY INCONTINENCE 07/07/2007  . UTI'S, RECURRENT 11/20/2009   kimbrough     Past Surgical History:  Procedure Laterality Date  . BUNIONECTOMY    . COLONOSCOPY    . LAPAROSCOPIC APPENDECTOMY  01/19/2016   Procedure: APPENDECTOMY LAPAROSCOPIC with orifice of cecal polyp;  Surgeon: Leighton Ruff, MD;  Location: [facility];   Service: General;;  . LEFT HEART CATH AND CORONARY ANGIOGRAPHY N/A 04/07/2017   Procedure: Left Heart Cath and Coronary Angiography;  Surgeon: Nelva Bush, MD;  Location: [facility];  Service: Cardiovascular;  Laterality: N/A;  . PARATHYROIDECTOMY     Rt Superior open neck exploration  . POLYPECTOMY    . RIGHT OOPHORECTOMY    . TONSILLECTOMY      There were no vitals filed for this visit.      Subjective Assessment - 05/23/17 1021    Subjective I want the pain to go away.  I walked up the hill by my house yesterday and then turned around and went home . Hills bother me.    Pertinent History osteoporosis. T11 compression fracture s/p fall 02/06/17   Limitations Standing;Sitting;Walking   How long can you sit comfortably? 10 minutes    How long can you stand comfortably? 10 minutes   How long can you walk comfortably? 10 minutes   Diagnostic tests x-ray to monitor healing   Patient Stated Goals improve strength, regain mobility/strength,    Currently in Pain? Yes   Pain Score 2    Pain Location Back   Pain Orientation Mid   Pain Onset More than a month ago   Pain Frequency Constant   Aggravating Factors  activity   Pain Relieving Factors rest, ice   Effect of Pain on Daily Activities standing and using  arms   Multiple Pain Sites No                         [facility] Adult PT Treatment/Exercise - 05/23/17 0001      Knee/Hip Exercises: Aerobic   Nustep L4 x 8 min     Knee/Hip Exercises: Standing   Hip Abduction AROM;Stengthening;Both;2 sets;10 reps  1#, cues for posture and TA contraction   Hip Extension AROM;Stengthening;Both;2 sets;10 reps  vc's for posture and TA contraction     Knee/Hip Exercises: Supine   Bridges with Clamshell Strengthening;20 reps     Shoulder Exercises: Supine   External Rotation Strengthening;Both;20 reps;Theraband   Theraband Level (Shoulder External Rotation) Level 2 (Red)  performed with simultaneus cerivcal  retraction     Shoulder Exercises: Standing   Flexion Strengthening;Both;10 reps  flexion and scaption 1#   Extension Strengthening;Both;Theraband;20 reps  2 x 10   Theraband Level (Shoulder Extension) Level 1 (Yellow)                  PT Short Term Goals - 05/20/17 1201      PT SHORT TERM GOAL #1   Title be independent in initial HEP   Status Achieved     PT SHORT TERM GOAL #2   Title verbalize and demonstrate postural modifications and body mechanics modifications needed for spinal protection to prevent future fracture   Status Achieved     PT SHORT TERM GOAL #3   Title reduce pain to sit/stand/walk for > or = to 15 minutes without limitation   Baseline 30 minutes   Status Achieved     PT SHORT TERM GOAL #4   Title perform 5x sit to stand in < or = to 16 seconds to reduce risk of falls   Status Achieved           PT Long Term Goals - 05/20/17 1202      PT LONG TERM GOAL #3   Title return to walking for exercise 20-30 minutes without limitation due to pain   Time 8   Period Weeks   Status On-going               Plan - 05/23/17 1025    Clinical Impression Statement Patient was able to tolerate exercises today but complained of increasing back pain after standing > 20 minutes.  Pt added resistance to UE/LE exercises today.  She continues to need skilled PT for postural strength and endurance.   PT Treatment/Interventions ADLs/Self Care Home Management;Cryotherapy;Electrical Stimulation;Functional mobility training;Stair training;Gait training;Ultrasound;Moist Heat;Therapeutic activities;Therapeutic exercise;Balance training;Neuromuscular re-education;Patient/family education;Manual techniques;Taping   PT Next Visit Plan postural strength, upper extremity strength, balance exercises, pain management   Consulted and Agree with Plan of Care Patient      Patient will benefit from skilled therapeutic intervention in order to improve the following  deficits and impairments:  Postural dysfunction, Decreased strength, Impaired flexibility, Improper body mechanics, Pain, Decreased activity tolerance, Decreased endurance, Increased muscle spasms, Impaired UE functional use, Decreased coordination  Visit Diagnosis: Muscle weakness (generalized)  Pain in thoracic spine  Abnormal posture  Repeated falls     Problem List Patient Active Problem List   Diagnosis Date Noted  . Coronary artery disease involving native coronary artery of native heart without angina pectoris 04/28/2017  . Syncope 04/07/2017  . Shortness of breath 04/07/2017  . Abnormal stress test 04/07/2017  . Sinus bradycardia 02/21/2017  . Coronary artery calcification 02/21/2017  . Compression  fracture of thoracic vertebra ([place]) 02/08/2017  . Dizziness and giddiness 02/08/2017  . Atrial fibrillation with rapid ventricular response ([place]) 01/21/2016  . Colon polyp s/p appy/partialcecetomy 01/19/3016 01/19/2016  . Orthostatic dizziness 04/25/2014  . Vertigo 12/26/2012  . Hypertension, uncontrolled 09/11/2012  . Diastolic dysfunction 47/42/5956  . Sleep disturbance 09/17/2011  . Hypotension 12/26/2010  . Orthostatic hypotension 12/26/2010  . UTI'S, RECURRENT 11/20/2009  . ADVERSE REACTION TO MEDICATION 07/31/2009  . SCOLIOSIS 05/25/2009  . FATIGUE 05/25/2009  . DIVERTICULOSIS, COLON 02/23/2009  . COLONIC POLYPS, ADENOMATOUS, HX OF 02/23/2009  . PARATHYROIDECTOMY 01/02/2009  . NEPHROLITHIASIS, HX OF 11/15/2008  . PALPITATIONS, OCCASIONAL 07/12/2008  . Essential hypertension 02/04/2008  . Constipation 01/01/2008  . LEG PAIN, LEFT 01/01/2008  . OSTEOPOROSIS 01/01/2008  . HYPERPARATHYROIDISM UNSPECIFIED 10/20/2007  . HYPERLIPIDEMIA 07/07/2007  . ANEMIA-NOS 07/07/2007  . URINARY INCONTINENCE 07/07/2007    Zannie Cove, PT 05/23/2017, 10:57 AM  [facility] [address], [place], [place],  [address] Phone: [phone]   Fax:  [phone]  Name: [name] MRN: [id number] Date of Birth: Oct 15, 1941

## 2017-05-28 ENCOUNTER — Ambulatory Visit: Payer: Medicare Other

## 2017-05-28 DIAGNOSIS — R293 Abnormal posture: Secondary | ICD-10-CM | POA: Diagnosis not present

## 2017-05-28 DIAGNOSIS — M6281 Muscle weakness (generalized): Secondary | ICD-10-CM | POA: Diagnosis not present

## 2017-05-28 DIAGNOSIS — R296 Repeated falls: Secondary | ICD-10-CM | POA: Diagnosis not present

## 2017-05-28 DIAGNOSIS — M546 Pain in thoracic spine: Secondary | ICD-10-CM | POA: Diagnosis not present

## 2017-05-28 DIAGNOSIS — R279 Unspecified lack of coordination: Secondary | ICD-10-CM | POA: Diagnosis not present

## 2017-05-28 NOTE — Therapy (Signed)
[facility] [address] [place], [place], [address] Phone: [phone]   Fax:  [phone]  Physical Therapy Treatment  Patient Details  Name: [name] MRN: [id number] Date of Birth: 1941-06-18 Referring Provider: Wandra Feinstein, MD  Encounter Date: 05/28/2017      PT End of Session - 05/28/17 1101    Visit Number 9   Number of Visits 10   Date for PT Re-Evaluation 06-28-2017   Authorization Type G-codes, KX at 43   PT Start Time 1018   PT Stop Time 1059   PT Time Calculation (min) 41 min   Activity Tolerance Patient tolerated treatment well   Behavior During Therapy [facility] for tasks assessed/performed      Past Medical History:  Diagnosis Date  . Abdominal pain, unspecified site   . ADVERSE REACTION TO MEDICATION 07/31/2009  . ANEMIA-NOS 07/07/2007  . Arthritis   . Chest pain, unspecified   . Chronic kidney disease    hx of kidney stone  . COLONIC POLYPS, ADENOMATOUS, HX OF   . Disturbance of skin sensation   . DIVERTICULOSIS, COLON 02/23/2009  . Family history of diabetes mellitus   . Family history of malignant neoplasm of breast   . FATIGUE 05/25/2009  . Fever, unspecified   . Heart murmur    hx of  . HYPERLIPIDEMIA 07/07/2007  . HYPERPARATHYROIDISM UNSPECIFIED 10/20/2007  . HYPERTENSION 02/04/2008  . NEPHROLITHIASIS, HX OF 11/15/2008  . OSTEOPOROSIS 01/01/2008  . Other acquired absence of organ    parathyroidectomy  . Pain in limb   . PALPITATIONS, OCCASIONAL 07/12/2008  . PARATHYROIDECTOMY 01/02/2009  . SCOLIOSIS 05/25/2009  . Seasonal allergies   . Unspecified constipation   . URINARY INCONTINENCE 07/07/2007  . UTI'S, RECURRENT 11/20/2009   kimbrough     Past Surgical History:  Procedure Laterality Date  . BUNIONECTOMY    . COLONOSCOPY    . LAPAROSCOPIC APPENDECTOMY  01/19/2016   Procedure: APPENDECTOMY LAPAROSCOPIC with orifice of cecal polyp;  Surgeon: Leighton Ruff, MD;  [facility];   Service: General;;  . LEFT HEART CATH AND CORONARY ANGIOGRAPHY N/A 04/07/2017   Procedure: Left Heart Cath and Coronary Angiography;  Surgeon: Nelva Bush, MD;  Location: [facility];  Service: Cardiovascular;  Laterality: N/A;  . PARATHYROIDECTOMY     Rt Superior open neck exploration  . POLYPECTOMY    . RIGHT OOPHORECTOMY    . TONSILLECTOMY      There were no vitals filed for this visit.      Subjective Assessment - 05/28/17 1023    Subjective The bump on my back is gone now.  Not sure why, but I am happy that it is.     Currently in Pain? No/denies                         [facility] Adult PT Treatment/Exercise - 05/28/17 0001      Knee/Hip Exercises: Aerobic   Nustep L4 x 8 min     Knee/Hip Exercises: Standing   Hip Abduction AROM;Stengthening;Both;2 sets;10 reps  1#, cues for posture and TA contraction   Hip Extension AROM;Stengthening;Both;2 sets;10 reps  vc's for posture and TA contraction     Shoulder Exercises: Supine   Other Supine Exercises supine on foam roll for decompression with towel under head x 4 minutes     Shoulder Exercises: Seated   Horizontal ABduction Strengthening;Both;20 reps   Theraband Level (Shoulder Horizontal  ABduction) Level 2 (Red)   Flexion Strengthening;Both;20 reps;Weights   Flexion Weight (lbs) 1   Abduction Strengthening;Both;20 reps;Weights   ABduction Weight (lbs) 1   ABduction Limitations scaption 1# 2x10   Other Seated Exercises D2 with red band x10 each     Shoulder Exercises: ROM/Strengthening   UBE (Upper Arm Bike) Level 0x 6 minutes (3/3)                  PT Short Term Goals - 05/20/17 1201      PT SHORT TERM GOAL #1   Title be independent in initial HEP   Status Achieved     PT SHORT TERM GOAL #2   Title verbalize and demonstrate postural modifications and body mechanics modifications needed for spinal protection to prevent future fracture   Status Achieved     PT SHORT TERM GOAL  #3   Title reduce pain to sit/stand/walk for > or = to 15 minutes without limitation   Baseline 30 minutes   Status Achieved     PT SHORT TERM GOAL #4   Title perform 5x sit to stand in < or = to 16 seconds to reduce risk of falls   Status Achieved           PT Long Term Goals - 05/28/17 1024      PT LONG TERM GOAL #1   Title independent with HEP and how to progress herself   Time 8   Period Weeks   Status On-going     PT LONG TERM GOAL #3   Title return to walking for exercise 20-30 minutes without limitation due to pain   Baseline 10 minutes, hasn't challenged this   Time 8   Period Weeks   Status On-going     PT LONG TERM GOAL #6   Title perform 5x sit to stand in < or = to 13 seconds to reduce risk of falls   Status Achieved               Plan - 05/28/17 1026    Clinical Impression Statement Pt with improved postural awareness and requires few verbal cues for alignment with exercise today.  PT is able to walk for 10 minutes and has not challenged this any further.  Pt was challenged  with 3 way raises with 1# weights.  Pt with postural weakness and core weakness and will continue to benefit from skilled PT for strength, posture and endurance.     Rehab Potential Good   PT Frequency 2x / week   PT Duration 8 weeks   PT Treatment/Interventions ADLs/Self Care Home Management;Cryotherapy;Electrical Stimulation;Functional mobility training;Stair training;Gait training;Ultrasound;Moist Heat;Therapeutic activities;Therapeutic exercise;Balance training;Neuromuscular re-education;Patient/family education;Manual techniques;Taping   PT Next Visit Plan postural strength, upper extremity strength, balance exercises, pain management.  G-codes next   Consulted and Agree with Plan of Care Patient      Patient will benefit from skilled therapeutic intervention in order to improve the following deficits and impairments:  Postural dysfunction, Decreased strength, Impaired  flexibility, Improper body mechanics, Pain, Decreased activity tolerance, Decreased endurance, Increased muscle spasms, Impaired UE functional use, Decreased coordination  Visit Diagnosis: Muscle weakness (generalized)  Pain in thoracic spine  Abnormal posture     Problem List Patient Active Problem List   Diagnosis Date Noted  . Coronary artery disease involving native coronary artery of native heart without angina pectoris 04/28/2017  . Syncope 04/07/2017  . Shortness of breath 04/07/2017  . Abnormal stress test 04/07/2017  .  Sinus bradycardia 02/21/2017  . Coronary artery calcification 02/21/2017  . Compression fracture of thoracic vertebra ([place]) 02/08/2017  . Dizziness and giddiness 02/08/2017  . Atrial fibrillation with rapid ventricular response ([place]) 01/21/2016  . Colon polyp s/p appy/partialcecetomy 01/19/3016 01/19/2016  . Orthostatic dizziness 04/25/2014  . Vertigo 12/26/2012  . Hypertension, uncontrolled 09/11/2012  . Diastolic dysfunction 76/80/8811  . Sleep disturbance 09/17/2011  . Hypotension 12/26/2010  . Orthostatic hypotension 12/26/2010  . UTI'S, RECURRENT 11/20/2009  . ADVERSE REACTION TO MEDICATION 07/31/2009  . SCOLIOSIS 05/25/2009  . FATIGUE 05/25/2009  . DIVERTICULOSIS, COLON 02/23/2009  . COLONIC POLYPS, ADENOMATOUS, HX OF 02/23/2009  . PARATHYROIDECTOMY 01/02/2009  . NEPHROLITHIASIS, HX OF 11/15/2008  . PALPITATIONS, OCCASIONAL 07/12/2008  . Essential hypertension 02/04/2008  . Constipation 01/01/2008  . LEG PAIN, LEFT 01/01/2008  . OSTEOPOROSIS 01/01/2008  . HYPERPARATHYROIDISM UNSPECIFIED 10/20/2007  . HYPERLIPIDEMIA 07/07/2007  . ANEMIA-NOS 07/07/2007  . URINARY INCONTINENCE 07/07/2007    Sigurd Sos, PT 05/28/17 11:04 AM  [facility] [address], [place], [place], [address] Phone: [phone]   Fax:  [phone]  Name: KASIE LECCESE MRN: [id number] Date of Birth:  1941-06-04

## 2017-05-30 ENCOUNTER — Encounter: Payer: Self-pay | Admitting: Physical Therapy

## 2017-05-30 ENCOUNTER — Ambulatory Visit: Payer: Medicare Other | Admitting: Physical Therapy

## 2017-05-30 DIAGNOSIS — R279 Unspecified lack of coordination: Secondary | ICD-10-CM

## 2017-05-30 DIAGNOSIS — M6281 Muscle weakness (generalized): Secondary | ICD-10-CM

## 2017-05-30 DIAGNOSIS — R296 Repeated falls: Secondary | ICD-10-CM | POA: Diagnosis not present

## 2017-05-30 DIAGNOSIS — M546 Pain in thoracic spine: Secondary | ICD-10-CM | POA: Diagnosis not present

## 2017-05-30 DIAGNOSIS — R293 Abnormal posture: Secondary | ICD-10-CM | POA: Diagnosis not present

## 2017-05-30 NOTE — Therapy (Addendum)
[facility] [address] [place], [place], [address] Phone: [phone]   Fax:  [phone]  Physical Therapy Treatment  Patient Details  Name: [name] MRN: [id number] Date of Birth: May 03, 1941 Referring Provider: Wandra Feinstein, MD  Encounter Date: 05/30/2017      PT End of Session - 05/30/17 1024    Visit Number 10   Number of Visits 10   Date for PT Re-Evaluation 12-Jul-2017   Authorization Type G-codes, KX at 47   PT Start Time 1021  pt late   PT Stop Time 1105   PT Time Calculation (min) 44 min   Activity Tolerance Patient tolerated treatment well   Behavior During Therapy [facility] for tasks assessed/performed      Past Medical History:  Diagnosis Date  . Abdominal pain, unspecified site   . ADVERSE REACTION TO MEDICATION 07/31/2009  . ANEMIA-NOS 07/07/2007  . Arthritis   . Chest pain, unspecified   . Chronic kidney disease    hx of kidney stone  . COLONIC POLYPS, ADENOMATOUS, HX OF   . Disturbance of skin sensation   . DIVERTICULOSIS, COLON 02/23/2009  . Family history of diabetes mellitus   . Family history of malignant neoplasm of breast   . FATIGUE 05/25/2009  . Fever, unspecified   . Heart murmur    hx of  . HYPERLIPIDEMIA 07/07/2007  . HYPERPARATHYROIDISM UNSPECIFIED 10/20/2007  . HYPERTENSION 02/04/2008  . NEPHROLITHIASIS, HX OF 11/15/2008  . OSTEOPOROSIS 01/01/2008  . Other acquired absence of organ    parathyroidectomy  . Pain in limb   . PALPITATIONS, OCCASIONAL 07/12/2008  . PARATHYROIDECTOMY 01/02/2009  . SCOLIOSIS 05/25/2009  . Seasonal allergies   . Unspecified constipation   . URINARY INCONTINENCE 07/07/2007  . UTI'S, RECURRENT 11/20/2009   kimbrough     Past Surgical History:  Procedure Laterality Date  . BUNIONECTOMY    . COLONOSCOPY    . LAPAROSCOPIC APPENDECTOMY  01/19/2016   Procedure: APPENDECTOMY LAPAROSCOPIC with orifice of cecal polyp;  Surgeon: Leighton Ruff, MD;  Location:  [facility];  Service: General;;  . LEFT HEART CATH AND CORONARY ANGIOGRAPHY N/A 04/07/2017   Procedure: Left Heart Cath and Coronary Angiography;  Surgeon: Nelva Bush, MD;  Location: [facility];  Service: Cardiovascular;  Laterality: N/A;  . PARATHYROIDECTOMY     Rt Superior open neck exploration  . POLYPECTOMY    . RIGHT OOPHORECTOMY    . TONSILLECTOMY      There were no vitals filed for this visit.      Subjective Assessment - 05/30/17 1026    Subjective I was really hurting afer last session, it was just too much. Today I am better. I am trying not to bend forward, this is hard because it feels good to do.     Pertinent History osteoporosis. T11 compression fracture s/p fall 02/06/17   Limitations Standing;Sitting;Walking   Currently in Pain? Yes   Pain Score 2    Pain Location Back   Pain Orientation Mid   Pain Descriptors / Indicators Dull   Aggravating Factors  Standing   Pain Relieving Factors ice, laying down with support   Multiple Pain Sites No            [facility] PT Assessment - 05/30/17 0001      Observation/Other Assessments   Focus on Therapeutic Outcomes (FOTO)  53% limited  Current CK , Goal status CJ  [facility] Adult PT Treatment/Exercise - 05/30/17 0001      Knee/Hip Exercises: Aerobic   Nustep L4 x 10 min  Concurrent review of status     Knee/Hip Exercises: Standing   Hip Abduction AROM;Stengthening;Both;2 sets;10 reps  1#, cues for posture and TA contraction   Hip Extension AROM;Stengthening;Both;2 sets;10 reps  vc's for posture and TA contraction     Shoulder Exercises: Seated   Retraction Strengthening;Both;20 reps;Weights   Retraction Weight (lbs) 1   Horizontal ABduction Strengthening;Both;20 reps   Theraband Level (Shoulder Horizontal ABduction) Level 2 (Red)   Flexion Strengthening;Both;20 reps;Weights   Flexion Weight (lbs) 1   Abduction Strengthening;Both;20 reps;Weights   ABduction Weight (lbs) 1    ABduction Limitations scaption 1# 2x10   Other Seated Exercises D2 with red band x10 each     Shoulder Exercises: ROM/Strengthening   UBE (Upper Arm Bike) Level 0x 6 minutes reverse motion                  PT Short Term Goals - 05/20/17 1201      PT SHORT TERM GOAL #1   Title be independent in initial HEP   Status Achieved     PT SHORT TERM GOAL #2   Title verbalize and demonstrate postural modifications and body mechanics modifications needed for spinal protection to prevent future fracture   Status Achieved     PT SHORT TERM GOAL #3   Title reduce pain to sit/stand/walk for > or = to 15 minutes without limitation   Baseline 30 minutes   Status Achieved     PT SHORT TERM GOAL #4   Title perform 5x sit to stand in < or = to 16 seconds to reduce risk of falls   Status Achieved           PT Long Term Goals - 05/28/17 1024      PT LONG TERM GOAL #1   Title independent with HEP and how to progress herself   Time 8   Period Weeks   Status On-going     PT LONG TERM GOAL #3   Title return to walking for exercise 20-30 minutes without limitation due to pain   Baseline 10 minutes, hasn't challenged this   Time 8   Period Weeks   Status On-going     PT LONG TERM GOAL #6   Title perform 5x sit to stand in < or = to 13 seconds to reduce risk of falls   Status Achieved               Plan - 05/30/17 1025    Clinical Impression Statement Pt had less pain perfroming exercises with back support today. She tolerated the 1# weights well, but was not interested in heavier weights at this time. Pt requires frequent verbal cuing for her posture thorugout the exercises.    Rehab Potential Good   PT Frequency 2x / week   PT Duration 8 weeks   PT Treatment/Interventions ADLs/Self Care Home Management;Cryotherapy;Electrical Stimulation;Functional mobility training;Stair training;Gait training;Ultrasound;Moist Heat;Therapeutic activities;Therapeutic exercise;Balance  training;Neuromuscular re-education;Patient/family education;Manual techniques;Taping   Consulted and Agree with Plan of Care Patient      Patient will benefit from skilled therapeutic intervention in order to improve the following deficits and impairments:  Postural dysfunction, Decreased strength, Impaired flexibility, Improper body mechanics, Pain, Decreased activity tolerance, Decreased endurance, Increased muscle spasms, Impaired UE functional use, Decreased coordination  Visit Diagnosis: Pain in thoracic spine  Abnormal posture  Muscle weakness (  generalized)  Repeated falls  Unspecified lack of coordination  Gcodes: FOTO 53% limited Current: CK Goal: [employer], PT 05/30/17 11:10 AM    Problem List Patient Active Problem List   Diagnosis Date Noted  . Coronary artery disease involving native coronary artery of native heart without angina pectoris 04/28/2017  . Syncope 04/07/2017  . Shortness of breath 04/07/2017  . Abnormal stress test 04/07/2017  . Sinus bradycardia 02/21/2017  . Coronary artery calcification 02/21/2017  . Compression fracture of thoracic vertebra ([place]) 02/08/2017  . Dizziness and giddiness 02/08/2017  . Atrial fibrillation with rapid ventricular response ([place]) 01/21/2016  . Colon polyp s/p appy/partialcecetomy 01/19/3016 01/19/2016  . Orthostatic dizziness 04/25/2014  . Vertigo 12/26/2012  . Hypertension, uncontrolled 09/11/2012  . Diastolic dysfunction 19/16/6060  . Sleep disturbance 09/17/2011  . Hypotension 12/26/2010  . Orthostatic hypotension 12/26/2010  . UTI'S, RECURRENT 11/20/2009  . ADVERSE REACTION TO MEDICATION 07/31/2009  . SCOLIOSIS 05/25/2009  . FATIGUE 05/25/2009  . DIVERTICULOSIS, COLON 02/23/2009  . COLONIC POLYPS, ADENOMATOUS, HX OF 02/23/2009  . PARATHYROIDECTOMY 01/02/2009  . NEPHROLITHIASIS, HX OF 11/15/2008  . PALPITATIONS, OCCASIONAL 07/12/2008  . Essential hypertension 02/04/2008  . Constipation 01/01/2008   . LEG PAIN, LEFT 01/01/2008  . OSTEOPOROSIS 01/01/2008  . HYPERPARATHYROIDISM UNSPECIFIED 10/20/2007  . HYPERLIPIDEMIA 07/07/2007  . ANEMIA-NOS 07/07/2007  . URINARY INCONTINENCE 07/07/2007    Freedom Lopezperez, PTA 05/30/2017, 11:00 AM   [facility] [address], [place], [place], [address] Phone: [phone]   Fax:  [phone]  Name: [name] MRN: [id number] Date of Birth: [birth year]  Zannie Cove, PT 05/30/17 11:10 AM

## 2017-06-03 ENCOUNTER — Ambulatory Visit: Payer: Medicare Other

## 2017-06-03 DIAGNOSIS — M546 Pain in thoracic spine: Secondary | ICD-10-CM | POA: Diagnosis not present

## 2017-06-03 DIAGNOSIS — R293 Abnormal posture: Secondary | ICD-10-CM

## 2017-06-03 DIAGNOSIS — M6281 Muscle weakness (generalized): Secondary | ICD-10-CM

## 2017-06-03 DIAGNOSIS — R279 Unspecified lack of coordination: Secondary | ICD-10-CM | POA: Diagnosis not present

## 2017-06-03 DIAGNOSIS — R296 Repeated falls: Secondary | ICD-10-CM

## 2017-06-03 NOTE — Therapy (Signed)
[facility] [address] [place], [place], [address] Phone: [phone]   Fax:  [phone]  Physical Therapy Treatment  Patient Details  Name: [name] MRN: [id number] Date of Birth: [birth year] Referring Provider: Wandra Feinstein, MD  Encounter Date: 06/03/2017      PT End of Session - 06/03/17 1006    Visit Number 11   Number of Visits 20   Date for PT Re-Evaluation 21-Jun-2017   Authorization Type G-codes at 73, KX at 47   PT Start Time 0931   PT Stop Time 1012   PT Time Calculation (min) 41 min   Activity Tolerance Patient tolerated treatment well   Behavior During Therapy [facility] for tasks assessed/performed      Past Medical History:  Diagnosis Date  . Abdominal pain, unspecified site   . ADVERSE REACTION TO MEDICATION 07/31/2009  . ANEMIA-NOS 07/07/2007  . Arthritis   . Chest pain, unspecified   . Chronic kidney disease    hx of kidney stone  . COLONIC POLYPS, ADENOMATOUS, HX OF   . Disturbance of skin sensation   . DIVERTICULOSIS, COLON 02/23/2009  . Family history of diabetes mellitus   . Family history of malignant neoplasm of breast   . FATIGUE 05/25/2009  . Fever, unspecified   . Heart murmur    hx of  . HYPERLIPIDEMIA 07/07/2007  . HYPERPARATHYROIDISM UNSPECIFIED 10/20/2007  . HYPERTENSION 02/04/2008  . NEPHROLITHIASIS, HX OF 11/15/2008  . OSTEOPOROSIS 01/01/2008  . Other acquired absence of organ    parathyroidectomy  . Pain in limb   . PALPITATIONS, OCCASIONAL 07/12/2008  . PARATHYROIDECTOMY 01/02/2009  . SCOLIOSIS 05/25/2009  . Seasonal allergies   . Unspecified constipation   . URINARY INCONTINENCE 07/07/2007  . UTI'S, RECURRENT 11/20/2009   kimbrough     Past Surgical History:  Procedure Laterality Date  . BUNIONECTOMY    . COLONOSCOPY    . LAPAROSCOPIC APPENDECTOMY  01/19/2016   Procedure: APPENDECTOMY LAPAROSCOPIC with orifice of cecal polyp;  Surgeon: Leighton Ruff, MD;  [facility]: [facility];  Service: General;;  . LEFT HEART CATH AND CORONARY ANGIOGRAPHY N/A 04/07/2017   Procedure: Left Heart Cath and Coronary Angiography;  Surgeon: Nelva Bush, MD;  Location: [facility];  Service: Cardiovascular;  Laterality: N/A;  . PARATHYROIDECTOMY     Rt Superior open neck exploration  . POLYPECTOMY    . RIGHT OOPHORECTOMY    . TONSILLECTOMY      There were no vitals filed for this visit.      Subjective Assessment - 06/03/17 0941    Subjective My balance feels like it is off.     Currently in Pain? Yes   Pain Score 2    Pain Location Back   Pain Orientation Mid   Pain Descriptors / Indicators Dull   Pain Type Acute pain   Pain Onset More than a month ago   Pain Frequency Constant   Aggravating Factors  standing   Pain Relieving Factors ice, laying down with support                         [facility] Adult PT Treatment/Exercise - 06/03/17 0001      Knee/Hip Exercises: Aerobic   Nustep L4 x 10 min  Concurrent review of status     Knee/Hip Exercises: Standing   Heel Raises 2 sets;10 reps   Hip Abduction AROM;Stengthening;Both;2 sets;10 reps  1#, cues for posture  and TA contraction   Abduction Limitations emphasis on reduced UE support to challenge balance   Hip Extension AROM;Stengthening;Both;2 sets;10 reps  vc's for posture and TA contraction   Rocker Board 3 minutes   Rebounder weight shifting 3 ways x 1 min each   Other Standing Knee Exercises standing on AirEx without UE support 5x 30 seconds   Other Standing Knee Exercises step taps alternating with minimal to no UE support: 2x20 reps.                  PT Short Term Goals - 05/20/17 1201      PT SHORT TERM GOAL #1   Title be independent in initial HEP   Status Achieved     PT SHORT TERM GOAL #2   Title verbalize and demonstrate postural modifications and body mechanics modifications needed for spinal protection to prevent future fracture   Status Achieved     PT  SHORT TERM GOAL #3   Title reduce pain to sit/stand/walk for > or = to 15 minutes without limitation   Baseline 30 minutes   Status Achieved     PT SHORT TERM GOAL #4   Title perform 5x sit to stand in < or = to 16 seconds to reduce risk of falls   Status Achieved           PT Long Term Goals - 06/03/17 0942      PT LONG TERM GOAL #1   Title independent with HEP and how to progress herself   Time 8   Period Weeks   Status On-going     PT LONG TERM GOAL #3   Title return to walking for exercise 20-30 minutes without limitation due to pain   Baseline 10 minutes, hasn't challenged this   Time 8   Period Weeks   Status On-going     PT LONG TERM GOAL #4   Title reduce pain to sit/stand/walk for > or = to 20-25 minutes without limitation   Time 8   Period Weeks   Status On-going               Plan - 06/03/17 0946    Clinical Impression Statement Pt reports that her balance has been off over the past week.  PT focused on core activation and posture with balance training activity today.  Pt requires verbal cues for posture during exercise.  Pt is limited to ~10 minutes of walking at this time.  Pt will continue to benefit from skilled PT for strength, endurance and balance training to improve safety at home and in the community.     Rehab Potential Good   PT Frequency 2x / week   PT Duration 8 weeks   PT Treatment/Interventions ADLs/Self Care Home Management;Cryotherapy;Electrical Stimulation;Functional mobility training;Stair training;Gait training;Ultrasound;Moist Heat;Therapeutic activities;Therapeutic exercise;Balance training;Neuromuscular re-education;Patient/family education;Manual techniques;Taping   PT Next Visit Plan postural strength, upper extremity strength, balance exercises, pain management.     Consulted and Agree with Plan of Care Patient      Patient will benefit from skilled therapeutic intervention in order to improve the following deficits and  impairments:  Postural dysfunction, Decreased strength, Impaired flexibility, Improper body mechanics, Pain, Decreased activity tolerance, Decreased endurance, Increased muscle spasms, Impaired UE functional use, Decreased coordination  Visit Diagnosis: Pain in thoracic spine  Abnormal posture  Muscle weakness (generalized)  Repeated falls  Unspecified lack of coordination     Problem List Patient Active Problem List   Diagnosis Date  Noted  . Coronary artery disease involving native coronary artery of native heart without angina pectoris 04/28/2017  . Syncope 04/07/2017  . Shortness of breath 04/07/2017  . Abnormal stress test 04/07/2017  . Sinus bradycardia 02/21/2017  . Coronary artery calcification 02/21/2017  . Compression fracture of thoracic vertebra ([place]) 02/08/2017  . Dizziness and giddiness 02/08/2017  . Atrial fibrillation with rapid ventricular response ([place]) 01/21/2016  . Colon polyp s/p appy/partialcecetomy 01/19/3016 01/19/2016  . Orthostatic dizziness 04/25/2014  . Vertigo 12/26/2012  . Hypertension, uncontrolled 09/11/2012  . Diastolic dysfunction 08/24/1218  . Sleep disturbance 09/17/2011  . Hypotension 12/26/2010  . Orthostatic hypotension 12/26/2010  . UTI'S, RECURRENT 11/20/2009  . ADVERSE REACTION TO MEDICATION 07/31/2009  . SCOLIOSIS 05/25/2009  . FATIGUE 05/25/2009  . DIVERTICULOSIS, COLON 02/23/2009  . COLONIC POLYPS, ADENOMATOUS, HX OF 02/23/2009  . PARATHYROIDECTOMY 01/02/2009  . NEPHROLITHIASIS, HX OF 11/15/2008  . PALPITATIONS, OCCASIONAL 07/12/2008  . Essential hypertension 02/04/2008  . Constipation 01/01/2008  . LEG PAIN, LEFT 01/01/2008  . OSTEOPOROSIS 01/01/2008  . HYPERPARATHYROIDISM UNSPECIFIED 10/20/2007  . HYPERLIPIDEMIA 07/07/2007  . ANEMIA-NOS 07/07/2007  . URINARY INCONTINENCE 07/07/2007   Sigurd Sos, PT 06/03/17 10:10 AM  [facility] [address], [place], [place], [address] Phone: [phone]   Fax:  [phone]  Name: TARIN JOHNDROW MRN: [id number] Date of Birth: 03-14-1941

## 2017-06-06 ENCOUNTER — Encounter: Payer: Self-pay | Admitting: Physical Therapy

## 2017-06-06 ENCOUNTER — Ambulatory Visit: Payer: Medicare Other | Admitting: Physical Therapy

## 2017-06-06 DIAGNOSIS — R279 Unspecified lack of coordination: Secondary | ICD-10-CM

## 2017-06-06 DIAGNOSIS — M6281 Muscle weakness (generalized): Secondary | ICD-10-CM | POA: Diagnosis not present

## 2017-06-06 DIAGNOSIS — R296 Repeated falls: Secondary | ICD-10-CM | POA: Diagnosis not present

## 2017-06-06 DIAGNOSIS — R293 Abnormal posture: Secondary | ICD-10-CM | POA: Diagnosis not present

## 2017-06-06 DIAGNOSIS — M546 Pain in thoracic spine: Secondary | ICD-10-CM | POA: Diagnosis not present

## 2017-06-06 NOTE — Therapy (Signed)
[facility] [address], [place], [place], [address] Phone: [phone]   Fax:  [phone]  Physical Therapy Treatment  Patient Details  Name: [name] MRN: [id number] Date of Birth: July 02, 1941 Referring Provider: Wandra Feinstein, MD  Encounter Date: 06/06/2017      PT End of Session - 06/06/17 1028    Visit Number 12   Number of Visits 20   Date for PT Re-Evaluation 07-Jul-2017   Authorization Type G-codes at 76, KX at 9   PT Start Time 1024  Pt late   PT Stop Time 1100   PT Time Calculation (min) 36 min   Activity Tolerance Patient tolerated treatment well   Behavior During Therapy [facility] for tasks assessed/performed      Past Medical History:  Diagnosis Date  . Abdominal pain, unspecified site   . ADVERSE REACTION TO MEDICATION 07/31/2009  . ANEMIA-NOS 07/07/2007  . Arthritis   . Chest pain, unspecified   . Chronic kidney disease    hx of kidney stone  . COLONIC POLYPS, ADENOMATOUS, HX OF   . Disturbance of skin sensation   . DIVERTICULOSIS, COLON 02/23/2009  . Family history of diabetes mellitus   . Family history of malignant neoplasm of breast   . FATIGUE 05/25/2009  . Fever, unspecified   . Heart murmur    hx of  . HYPERLIPIDEMIA 07/07/2007  . HYPERPARATHYROIDISM UNSPECIFIED 10/20/2007  . HYPERTENSION 02/04/2008  . NEPHROLITHIASIS, HX OF 11/15/2008  . OSTEOPOROSIS 01/01/2008  . Other acquired absence of organ    parathyroidectomy  . Pain in limb   . PALPITATIONS, OCCASIONAL 07/12/2008  . PARATHYROIDECTOMY 01/02/2009  . SCOLIOSIS 05/25/2009  . Seasonal allergies   . Unspecified constipation   . URINARY INCONTINENCE 07/07/2007  . UTI'S, RECURRENT 11/20/2009   kimbrough     Past Surgical History:  Procedure Laterality Date  . BUNIONECTOMY    . COLONOSCOPY    . LAPAROSCOPIC APPENDECTOMY  01/19/2016   Procedure: APPENDECTOMY LAPAROSCOPIC with orifice of cecal polyp;  Surgeon: Leighton Ruff, MD;   [facility]: [facility];  Service: General;;  . LEFT HEART CATH AND CORONARY ANGIOGRAPHY N/A 04/07/2017   Procedure: Left Heart Cath and Coronary Angiography;  Surgeon: Nelva Bush, MD;  Location: [place] [facility];  Service: Cardiovascular;  Laterality: N/A;  . PARATHYROIDECTOMY     Rt Superior open neck exploration  . POLYPECTOMY    . RIGHT OOPHORECTOMY    . TONSILLECTOMY      There were no vitals filed for this visit.      Subjective Assessment - 06/06/17 1029    Subjective No current pain but it can come totally out of the blue.    Pertinent History osteoporosis. T11 compression fracture s/p fall 02/06/17   Currently in Pain? No/denies                         [facility] Adult PT Treatment/Exercise - 06/06/17 0001      High Level Balance   High Level Balance Activities Tandem walking;Marching forwards;Negotitating around obstacles;Negotiating over obstacles     Knee/Hip Exercises: Aerobic   Nustep L4 x 10 min  Concurrent review of status                  PT Short Term Goals - 05/20/17 1201      PT SHORT TERM GOAL #1   Title be independent in initial HEP   Status Achieved  PT SHORT TERM GOAL #2   Title verbalize and demonstrate postural modifications and body mechanics modifications needed for spinal protection to prevent future fracture   Status Achieved     PT SHORT TERM GOAL #3   Title reduce pain to sit/stand/walk for > or = to 15 minutes without limitation   Baseline 30 minutes   Status Achieved     PT SHORT TERM GOAL #4   Title perform 5x sit to stand in < or = to 16 seconds to reduce risk of falls   Status Achieved           PT Long Term Goals - 06/03/17 0942      PT LONG TERM GOAL #1   Title independent with HEP and how to progress herself   Time 8   Period Weeks   Status On-going     PT LONG TERM GOAL #3   Title return to walking for exercise 20-30 minutes without limitation due to pain   Baseline 10 minutes, hasn't  challenged this   Time 8   Period Weeks   Status On-going     PT LONG TERM GOAL #4   Title reduce pain to sit/stand/walk for > or = to 20-25 minutes without limitation   Time 8   Period Weeks   Status On-going               Plan - 06/06/17 1028    Clinical Impression Statement Treatment focus was primarily on dynamic balance today. Pt demonstrated excellent balance throughout all her exercises. Pt is unsure what her future HEP plans are except her current HEP given by this clinic.       Rehab Potential Good   PT Frequency 2x / week   PT Duration 8 weeks   PT Treatment/Interventions ADLs/Self Care Home Management;Cryotherapy;Electrical Stimulation;Functional mobility training;Stair training;Gait training;Ultrasound;Moist Heat;Therapeutic activities;Therapeutic exercise;Balance training;Neuromuscular re-education;Patient/family education;Manual techniques;Taping   PT Next Visit Plan postural strength, upper extremity strength, balance exercises, pain management.     Consulted and Agree with Plan of Care Patient      Patient will benefit from skilled therapeutic intervention in order to improve the following deficits and impairments:  Postural dysfunction, Decreased strength, Impaired flexibility, Improper body mechanics, Pain, Decreased activity tolerance, Decreased endurance, Increased muscle spasms, Impaired UE functional use, Decreased coordination  Visit Diagnosis: Pain in thoracic spine  Abnormal posture  Muscle weakness (generalized)  Repeated falls  Unspecified lack of coordination     Problem List Patient Active Problem List   Diagnosis Date Noted  . Coronary artery disease involving native coronary artery of native heart without angina pectoris 04/28/2017  . Syncope 04/07/2017  . Shortness of breath 04/07/2017  . Abnormal stress test 04/07/2017  . Sinus bradycardia 02/21/2017  . Coronary artery calcification 02/21/2017  . Compression fracture of thoracic  vertebra ([place]) 02/08/2017  . Dizziness and giddiness 02/08/2017  . Atrial fibrillation with rapid ventricular response ([place]) 01/21/2016  . Colon polyp s/p appy/partialcecetomy 01/19/3016 01/19/2016  . Orthostatic dizziness 04/25/2014  . Vertigo 12/26/2012  . Hypertension, uncontrolled 09/11/2012  . Diastolic dysfunction 12/45/8099  . Sleep disturbance 09/17/2011  . Hypotension 12/26/2010  . Orthostatic hypotension 12/26/2010  . UTI'S, RECURRENT 11/20/2009  . ADVERSE REACTION TO MEDICATION 07/31/2009  . SCOLIOSIS 05/25/2009  . FATIGUE 05/25/2009  . DIVERTICULOSIS, COLON 02/23/2009  . COLONIC POLYPS, ADENOMATOUS, HX OF 02/23/2009  . PARATHYROIDECTOMY 01/02/2009  . NEPHROLITHIASIS, HX OF 11/15/2008  . PALPITATIONS, OCCASIONAL 07/12/2008  . Essential hypertension 02/04/2008  .  Constipation 01/01/2008  . LEG PAIN, LEFT 01/01/2008  . OSTEOPOROSIS 01/01/2008  . HYPERPARATHYROIDISM UNSPECIFIED 10/20/2007  . HYPERLIPIDEMIA 07/07/2007  . ANEMIA-NOS 07/07/2007  . URINARY INCONTINENCE 07/07/2007    Marly Schuld, PTA 06/06/2017, 10:57 AM  [facility] [address] [place], [place], [address] Phone: [phone]   Fax:  [phone]  Name: [name] MRN: [id number] Date of Birth: 1941/07/15

## 2017-06-10 ENCOUNTER — Ambulatory Visit: Payer: Medicare Other

## 2017-06-10 DIAGNOSIS — R296 Repeated falls: Secondary | ICD-10-CM

## 2017-06-10 DIAGNOSIS — M546 Pain in thoracic spine: Secondary | ICD-10-CM

## 2017-06-10 DIAGNOSIS — M6281 Muscle weakness (generalized): Secondary | ICD-10-CM | POA: Diagnosis not present

## 2017-06-10 DIAGNOSIS — R293 Abnormal posture: Secondary | ICD-10-CM

## 2017-06-10 DIAGNOSIS — R279 Unspecified lack of coordination: Secondary | ICD-10-CM | POA: Diagnosis not present

## 2017-06-10 NOTE — Therapy (Signed)
[facility] [address] [place], [place], [address] Phone: [phone]   Fax:  [phone]  Physical Therapy Treatment  Patient Details  Name: [name] MRN: [id number] Date of Birth: 1941-08-29 Referring Provider: Wandra Feinstein, MD  Encounter Date: 06/10/2017      PT End of Session - 06/10/17 1053    Visit Number 13   Number of Visits 20   Date for PT Re-Evaluation 06-28-2017   Authorization Type G-codes at 75, KX at 55   PT Start Time 1017   PT Stop Time 1058   PT Time Calculation (min) 41 min   Activity Tolerance Patient tolerated treatment well   Behavior During Therapy [facility] for tasks assessed/performed      Past Medical History:  Diagnosis Date  . Abdominal pain, unspecified site   . ADVERSE REACTION TO MEDICATION 07/31/2009  . ANEMIA-NOS 07/07/2007  . Arthritis   . Chest pain, unspecified   . Chronic kidney disease    hx of kidney stone  . COLONIC POLYPS, ADENOMATOUS, HX OF   . Disturbance of skin sensation   . DIVERTICULOSIS, COLON 02/23/2009  . Family history of diabetes mellitus   . Family history of malignant neoplasm of breast   . FATIGUE 05/25/2009  . Fever, unspecified   . Heart murmur    hx of  . HYPERLIPIDEMIA 07/07/2007  . HYPERPARATHYROIDISM UNSPECIFIED 10/20/2007  . HYPERTENSION 02/04/2008  . NEPHROLITHIASIS, HX OF 11/15/2008  . OSTEOPOROSIS 01/01/2008  . Other acquired absence of organ    parathyroidectomy  . Pain in limb   . PALPITATIONS, OCCASIONAL 07/12/2008  . PARATHYROIDECTOMY 01/02/2009  . SCOLIOSIS 05/25/2009  . Seasonal allergies   . Unspecified constipation   . URINARY INCONTINENCE 07/07/2007  . UTI'S, RECURRENT 11/20/2009   kimbrough     Past Surgical History:  Procedure Laterality Date  . BUNIONECTOMY    . COLONOSCOPY    . LAPAROSCOPIC APPENDECTOMY  01/19/2016   Procedure: APPENDECTOMY LAPAROSCOPIC with orifice of cecal polyp;  Surgeon: Leighton Ruff, MD;  Location: [facility];  Service: General;;  . LEFT HEART CATH AND CORONARY ANGIOGRAPHY N/A 04/07/2017   Procedure: Left Heart Cath and Coronary Angiography;  Surgeon: Nelva Bush, MD;  Location: [place] [facility];  Service: Cardiovascular;  Laterality: N/A;  . PARATHYROIDECTOMY     Rt Superior open neck exploration  . POLYPECTOMY    . RIGHT OOPHORECTOMY    . TONSILLECTOMY      There were no vitals filed for this visit.      Subjective Assessment - 06/10/17 1025    Subjective I am feeling tired because of the weather.     Pertinent History osteoporosis. T11 compression fracture s/p fall 02/06/17   Currently in Pain? Yes   Pain Score 2    Pain Location Back   Pain Orientation Mid   Pain Descriptors / Indicators Dull   Pain Type Acute pain   Pain Onset More than a month ago   Pain Frequency Constant   Aggravating Factors  standing, endurance activities   Pain Relieving Factors ice, laying down with support, sitting down                         [facility] Adult PT Treatment/Exercise - 06/10/17 0001      Knee/Hip Exercises: Aerobic   Nustep L4 x 10 min  Concurrent review of status     Knee/Hip Exercises: Standing   Heel  Raises 2 sets;10 reps   Hip Abduction AROM;Stengthening;Both;2 sets;10 reps  cues for posture and TA contraction   Abduction Limitations emphasis on reduced UE support to challenge balance   Hip Extension AROM;Stengthening;Both;2 sets;10 reps  vc's for posture and TA contraction   Rebounder weight shifting 3 ways x 1 min each     Knee/Hip Exercises: Supine   Other Supine Knee/Hip Exercises ball squeeze with abdominal bracing 2x10     Shoulder Exercises: Seated   Flexion Strengthening;20 reps;Both   Abduction Strengthening;Both;20 reps     Shoulder Exercises: ROM/Strengthening   UBE (Upper Arm Bike) Level 0 x 6 minutes (3/3)                  PT Short Term Goals - 05/20/17 1201      PT SHORT TERM GOAL #1   Title be independent in initial HEP    Status Achieved     PT SHORT TERM GOAL #2   Title verbalize and demonstrate postural modifications and body mechanics modifications needed for spinal protection to prevent future fracture   Status Achieved     PT SHORT TERM GOAL #3   Title reduce pain to sit/stand/walk for > or = to 15 minutes without limitation   Baseline 30 minutes   Status Achieved     PT SHORT TERM GOAL #4   Title perform 5x sit to stand in < or = to 16 seconds to reduce risk of falls   Status Achieved           PT Long Term Goals - 06/10/17 1027      PT LONG TERM GOAL #1   Title independent with HEP and how to progress herself   Time 8   Status On-going     PT LONG TERM GOAL #3   Title return to walking for exercise 20-30 minutes without limitation due to pain   Baseline 10 minutes, hasn't challenged this   Time 8   Period Weeks   Status On-going     PT LONG TERM GOAL #4   Title reduce pain to sit/stand/walk for > or = to 20-25 minutes without limitation   Time 8   Period Weeks   Status On-going               Plan - 06/10/17 1036    Clinical Impression Statement Pt reports that she has not been performing her HEP regularly and that she feels fatigued.  PT emphasized the importance of compliance with HEP for strength and endurance gains.  Pt was able to cook a full meal over the weekend which included standing for 45 minutes.  Pt with fatigue after this activity.  Pt reports improved endurance for standing exercise.  Pt has not initiated a walking program yet.  Pt requires verbal and tactile cues for posture with exercise.  Pt will continue to benefit from skilled PT for strength, endurance, posture and balance exercise.     Rehab Potential Good   PT Frequency 2x / week   PT Duration 8 weeks   PT Treatment/Interventions ADLs/Self Care Home Management;Cryotherapy;Electrical Stimulation;Functional mobility training;Stair training;Gait training;Ultrasound;Moist Heat;Therapeutic  activities;Therapeutic exercise;Balance training;Neuromuscular re-education;Patient/family education;Manual techniques;Taping   PT Next Visit Plan postural strength, upper extremity strength, balance exercises, pain management.  Renewal next week.     Consulted and Agree with Plan of Care Patient      Patient will benefit from skilled therapeutic intervention in order to improve the following deficits and impairments:  Postural dysfunction, Decreased strength, Impaired flexibility, Improper body mechanics, Pain, Decreased activity tolerance, Decreased endurance, Increased muscle spasms, Impaired UE functional use, Decreased coordination  Visit Diagnosis: Pain in thoracic spine  Abnormal posture  Muscle weakness (generalized)  Repeated falls     Problem List Patient Active Problem List   Diagnosis Date Noted  . Coronary artery disease involving native coronary artery of native heart without angina pectoris 04/28/2017  . Syncope 04/07/2017  . Shortness of breath 04/07/2017  . Abnormal stress test 04/07/2017  . Sinus bradycardia 02/21/2017  . Coronary artery calcification 02/21/2017  . Compression fracture of thoracic vertebra ([place]) 02/08/2017  . Dizziness and giddiness 02/08/2017  . Atrial fibrillation with rapid ventricular response ([place]) 01/21/2016  . Colon polyp s/p appy/partialcecetomy 01/19/3016 01/19/2016  . Orthostatic dizziness 04/25/2014  . Vertigo 12/26/2012  . Hypertension, uncontrolled 09/11/2012  . Diastolic dysfunction 01/65/5374  . Sleep disturbance 09/17/2011  . Hypotension 12/26/2010  . Orthostatic hypotension 12/26/2010  . UTI'S, RECURRENT 11/20/2009  . ADVERSE REACTION TO MEDICATION 07/31/2009  . SCOLIOSIS 05/25/2009  . FATIGUE 05/25/2009  . DIVERTICULOSIS, COLON 02/23/2009  . COLONIC POLYPS, ADENOMATOUS, HX OF 02/23/2009  . PARATHYROIDECTOMY 01/02/2009  . NEPHROLITHIASIS, HX OF 11/15/2008  . PALPITATIONS, OCCASIONAL 07/12/2008  . Essential hypertension  02/04/2008  . Constipation 01/01/2008  . LEG PAIN, LEFT 01/01/2008  . OSTEOPOROSIS 01/01/2008  . HYPERPARATHYROIDISM UNSPECIFIED 10/20/2007  . HYPERLIPIDEMIA 07/07/2007  . ANEMIA-NOS 07/07/2007  . URINARY INCONTINENCE 07/07/2007     Sigurd Sos, PT 06/10/17 10:59 AM  [facility] [address] [place], [place], [address] Phone: [phone]   Fax:  [phone]  Name: [name] MRN: [id number] Date of Birth: 1941/10/16

## 2017-06-13 ENCOUNTER — Encounter: Payer: Self-pay | Admitting: Physical Therapy

## 2017-06-13 ENCOUNTER — Ambulatory Visit: Payer: Medicare Other | Admitting: Physical Therapy

## 2017-06-13 DIAGNOSIS — M6281 Muscle weakness (generalized): Secondary | ICD-10-CM

## 2017-06-13 DIAGNOSIS — R296 Repeated falls: Secondary | ICD-10-CM

## 2017-06-13 DIAGNOSIS — R279 Unspecified lack of coordination: Secondary | ICD-10-CM | POA: Diagnosis not present

## 2017-06-13 DIAGNOSIS — M546 Pain in thoracic spine: Secondary | ICD-10-CM | POA: Diagnosis not present

## 2017-06-13 DIAGNOSIS — R293 Abnormal posture: Secondary | ICD-10-CM

## 2017-06-13 NOTE — Therapy (Signed)
[facility] [address] [place], [place], [address] Phone: [phone]   Fax:  [phone]  Physical Therapy Treatment  Patient Details  Name: [name] MRN: [id number] Date of Birth: 1941/09/25 Referring Provider: Wandra Feinstein, MD  Encounter Date: 06/13/2017      PT End of Session - 06/13/17 1017    Visit Number 14   Number of Visits 20   Date for PT Re-Evaluation 2017/07/01   Authorization Type G-codes at 37, KX at 5   PT Start Time 1015   PT Stop Time 1053   PT Time Calculation (min) 38 min   Activity Tolerance Patient tolerated treatment well   Behavior During Therapy [facility] for tasks assessed/performed      Past Medical History:  Diagnosis Date  . Abdominal pain, unspecified site   . ADVERSE REACTION TO MEDICATION 07/31/2009  . ANEMIA-NOS 07/07/2007  . Arthritis   . Chest pain, unspecified   . Chronic kidney disease    hx of kidney stone  . COLONIC POLYPS, ADENOMATOUS, HX OF   . Disturbance of skin sensation   . DIVERTICULOSIS, COLON 02/23/2009  . Family history of diabetes mellitus   . Family history of malignant neoplasm of breast   . FATIGUE 05/25/2009  . Fever, unspecified   . Heart murmur    hx of  . HYPERLIPIDEMIA 07/07/2007  . HYPERPARATHYROIDISM UNSPECIFIED 10/20/2007  . HYPERTENSION 02/04/2008  . NEPHROLITHIASIS, HX OF 11/15/2008  . OSTEOPOROSIS 01/01/2008  . Other acquired absence of organ    parathyroidectomy  . Pain in limb   . PALPITATIONS, OCCASIONAL 07/12/2008  . PARATHYROIDECTOMY 01/02/2009  . SCOLIOSIS 05/25/2009  . Seasonal allergies   . Unspecified constipation   . URINARY INCONTINENCE 07/07/2007  . UTI'S, RECURRENT 11/20/2009   kimbrough     Past Surgical History:  Procedure Laterality Date  . BUNIONECTOMY    . COLONOSCOPY    . LAPAROSCOPIC APPENDECTOMY  01/19/2016   Procedure: APPENDECTOMY LAPAROSCOPIC with orifice of cecal polyp;  Surgeon: Leighton Ruff, MD;  Location: [facility];  Service: General;;  . LEFT HEART CATH AND CORONARY ANGIOGRAPHY N/A 04/07/2017   Procedure: Left Heart Cath and Coronary Angiography;  Surgeon: Nelva Bush, MD;  Location: [place] [facility];  Service: Cardiovascular;  Laterality: N/A;  . PARATHYROIDECTOMY     Rt Superior open neck exploration  . POLYPECTOMY    . RIGHT OOPHORECTOMY    . TONSILLECTOMY      There were no vitals filed for this visit.      Subjective Assessment - 06/13/17 1019    Subjective No pain, no falls.   Pertinent History osteoporosis. T11 compression fracture s/p fall 02/06/17   Currently in Pain? No/denies   Multiple Pain Sites No                         [facility] Adult PT Treatment/Exercise - 06/13/17 0001      High Level Balance   High Level Balance Activities --  Single leg stance with other foot tapping cone in 3 planes   High Level Balance Comments standing on [employer], cone stack to second shelf x 1 min     Knee/Hip Exercises: Aerobic   Nustep L4 x 10 min  Concurrent review of status     Knee/Hip Exercises: Standing   Heel Raises 2 sets;10 reps   Hip Abduction AROM;Stengthening;Both;2 sets;10 reps  cues for posture and TA contraction  Abduction Limitations emphasis on reduced UE support to challenge balance   Hip Extension AROM;Stengthening;Both;2 sets;10 reps  vc's for posture and TA contraction   Rebounder weight shifting 3 ways x 1 min each                  PT Short Term Goals - 05/20/17 1201      PT SHORT TERM GOAL #1   Title be independent in initial HEP   Status Achieved     PT SHORT TERM GOAL #2   Title verbalize and demonstrate postural modifications and body mechanics modifications needed for spinal protection to prevent future fracture   Status Achieved     PT SHORT TERM GOAL #3   Title reduce pain to sit/stand/walk for > or = to 15 minutes without limitation   Baseline 30 minutes   Status Achieved     PT SHORT TERM GOAL #4   Title perform 5x  sit to stand in < or = to 16 seconds to reduce risk of falls   Status Achieved           PT Long Term Goals - 06/10/17 1027      PT LONG TERM GOAL #1   Title independent with HEP and how to progress herself   Time 8   Status On-going     PT LONG TERM GOAL #3   Title return to walking for exercise 20-30 minutes without limitation due to pain   Baseline 10 minutes, hasn't challenged this   Time 8   Period Weeks   Status On-going     PT LONG TERM GOAL #4   Title reduce pain to sit/stand/walk for > or = to 20-25 minutes without limitation   Time 8   Period Weeks   Status On-going               Plan - 06/13/17 1018    Clinical Impression Statement Pt biggest complaint today is her fatigue with daily activities, an overall sense of fatigue AND back muscle fatigue.    Rehab Potential Good   PT Frequency 2x / week   PT Duration 8 weeks   PT Treatment/Interventions ADLs/Self Care Home Management;Cryotherapy;Electrical Stimulation;Functional mobility training;Stair training;Gait training;Ultrasound;Moist Heat;Therapeutic activities;Therapeutic exercise;Balance training;Neuromuscular re-education;Patient/family education;Manual techniques;Taping   PT Next Visit Plan postural strength, upper extremity strength, balance exercises, pain management.  Renewal next visit.    Consulted and Agree with Plan of Care Patient      Patient will benefit from skilled therapeutic intervention in order to improve the following deficits and impairments:  Postural dysfunction, Decreased strength, Impaired flexibility, Improper body mechanics, Pain, Decreased activity tolerance, Decreased endurance, Increased muscle spasms, Impaired UE functional use, Decreased coordination  Visit Diagnosis: Pain in thoracic spine  Abnormal posture  Muscle weakness (generalized)  Repeated falls  Unspecified lack of coordination     Problem List Patient Active Problem List   Diagnosis Date Noted  .  Coronary artery disease involving native coronary artery of native heart without angina pectoris 04/28/2017  . Syncope 04/07/2017  . Shortness of breath 04/07/2017  . Abnormal stress test 04/07/2017  . Sinus bradycardia 02/21/2017  . Coronary artery calcification 02/21/2017  . Compression fracture of thoracic vertebra ([place]) 02/08/2017  . Dizziness and giddiness 02/08/2017  . Atrial fibrillation with rapid ventricular response ([place]) 01/21/2016  . Colon polyp s/p appy/partialcecetomy 01/19/3016 01/19/2016  . Orthostatic dizziness 04/25/2014  . Vertigo 12/26/2012  . Hypertension, uncontrolled 09/11/2012  . Diastolic dysfunction 85/88/5027  .  Sleep disturbance 09/17/2011  . Hypotension 12/26/2010  . Orthostatic hypotension 12/26/2010  . UTI'S, RECURRENT 11/20/2009  . ADVERSE REACTION TO MEDICATION 07/31/2009  . SCOLIOSIS 05/25/2009  . FATIGUE 05/25/2009  . DIVERTICULOSIS, COLON 02/23/2009  . COLONIC POLYPS, ADENOMATOUS, HX OF 02/23/2009  . PARATHYROIDECTOMY 01/02/2009  . NEPHROLITHIASIS, HX OF 11/15/2008  . PALPITATIONS, OCCASIONAL 07/12/2008  . Essential hypertension 02/04/2008  . Constipation 01/01/2008  . LEG PAIN, LEFT 01/01/2008  . OSTEOPOROSIS 01/01/2008  . HYPERPARATHYROIDISM UNSPECIFIED 10/20/2007  . HYPERLIPIDEMIA 07/07/2007  . ANEMIA-NOS 07/07/2007  . URINARY INCONTINENCE 07/07/2007    COCHRAN,JENNIFER, PTA 06/13/2017, 10:48 AM  [facility] [address] [place], [place], [address] Phone: [phone]   Fax:  [phone]  Name: [name] MRN: [id number] Date of Birth: 06-14-1941

## 2017-06-16 DIAGNOSIS — Z85828 Personal history of other malignant neoplasm of skin: Secondary | ICD-10-CM | POA: Diagnosis not present

## 2017-06-16 DIAGNOSIS — D1801 Hemangioma of skin and subcutaneous tissue: Secondary | ICD-10-CM | POA: Diagnosis not present

## 2017-06-16 DIAGNOSIS — L821 Other seborrheic keratosis: Secondary | ICD-10-CM | POA: Diagnosis not present

## 2017-06-16 DIAGNOSIS — L738 Other specified follicular disorders: Secondary | ICD-10-CM | POA: Diagnosis not present

## 2017-06-16 DIAGNOSIS — D224 Melanocytic nevi of scalp and neck: Secondary | ICD-10-CM | POA: Diagnosis not present

## 2017-06-16 DIAGNOSIS — D225 Melanocytic nevi of trunk: Secondary | ICD-10-CM | POA: Diagnosis not present

## 2017-06-17 ENCOUNTER — Ambulatory Visit: Payer: Medicare Other

## 2017-06-17 DIAGNOSIS — R293 Abnormal posture: Secondary | ICD-10-CM

## 2017-06-17 DIAGNOSIS — R296 Repeated falls: Secondary | ICD-10-CM | POA: Diagnosis not present

## 2017-06-17 DIAGNOSIS — R279 Unspecified lack of coordination: Secondary | ICD-10-CM | POA: Diagnosis not present

## 2017-06-17 DIAGNOSIS — M6281 Muscle weakness (generalized): Secondary | ICD-10-CM

## 2017-06-17 DIAGNOSIS — M546 Pain in thoracic spine: Secondary | ICD-10-CM | POA: Diagnosis not present

## 2017-06-17 NOTE — Therapy (Signed)
[facility] [address], [place], [place], [address] Phone: [phone]   Fax:  [phone]  Physical Therapy Treatment  Patient Details  Name: [name] MRN: [id number] Date of Birth: 1941-06-09 Referring Provider: Wandra Feinstein, MD  Encounter Date: 06/17/2017      PT End of Session - 06/17/17 1145    Visit Number 15   Number of Visits 20   Date for PT Re-Evaluation 08-23-2017   Authorization Type G-codes at 107, KX modifier needed   PT Start Time 1100   PT Stop Time 1143   PT Time Calculation (min) 43 min   Activity Tolerance Patient tolerated treatment well   Behavior During Therapy [facility] for tasks assessed/performed      Past Medical History:  Diagnosis Date  . Abdominal pain, unspecified site   . ADVERSE REACTION TO MEDICATION 07/31/2009  . ANEMIA-NOS 07/07/2007  . Arthritis   . Chest pain, unspecified   . Chronic kidney disease    hx of kidney stone  . COLONIC POLYPS, ADENOMATOUS, HX OF   . Disturbance of skin sensation   . DIVERTICULOSIS, COLON 02/23/2009  . Family history of diabetes mellitus   . Family history of malignant neoplasm of breast   . FATIGUE 05/25/2009  . Fever, unspecified   . Heart murmur    hx of  . HYPERLIPIDEMIA 07/07/2007  . HYPERPARATHYROIDISM UNSPECIFIED 10/20/2007  . HYPERTENSION 02/04/2008  . NEPHROLITHIASIS, HX OF 11/15/2008  . OSTEOPOROSIS 01/01/2008  . Other acquired absence of organ    parathyroidectomy  . Pain in limb   . PALPITATIONS, OCCASIONAL 07/12/2008  . PARATHYROIDECTOMY 01/02/2009  . SCOLIOSIS 05/25/2009  . Seasonal allergies   . Unspecified constipation   . URINARY INCONTINENCE 07/07/2007  . UTI'S, RECURRENT 11/20/2009   kimbrough     Past Surgical History:  Procedure Laterality Date  . BUNIONECTOMY    . COLONOSCOPY    . LAPAROSCOPIC APPENDECTOMY  01/19/2016   Procedure: APPENDECTOMY LAPAROSCOPIC with orifice of cecal polyp;  Surgeon: Leighton Ruff, MD;   Location: [facility];  Service: General;;  . LEFT HEART CATH AND CORONARY ANGIOGRAPHY N/A 04/07/2017   Procedure: Left Heart Cath and Coronary Angiography;  Surgeon: Nelva Bush, MD;  Location: [place] [facility];  Service: Cardiovascular;  Laterality: N/A;  . PARATHYROIDECTOMY     Rt Superior open neck exploration  . POLYPECTOMY    . RIGHT OOPHORECTOMY    . TONSILLECTOMY      There were no vitals filed for this visit.      Subjective Assessment - 06/17/17 1105    Subjective I feel like PT is helping me.  My back is better.  I still need to work on my balance.  30% overall improvement since the start of care.     Pertinent History osteoporosis. T11 compression fracture s/p fall 02/06/17   How long can you sit comfortably? 2 hours   How long can you stand comfortably? 30 minutes   How long can you walk comfortably? 10-20 minutes   Patient Stated Goals improve strength, regain mobility/strength,    Currently in Pain? Yes   Pain Score 0-No pain  up to 4-5/10   Pain Location Back   Pain Orientation Mid   Pain Descriptors / Indicators Dull   Pain Type Acute pain   Pain Onset More than a month ago   Pain Frequency Constant   Aggravating Factors  standing, endurance, stress   Pain Relieving Factors ice, supine,  sitting down            [facility] PT Assessment - 06/17/17 0001      Assessment   Medical Diagnosis osteoporosis, T11 compression fracture   Onset Date/Surgical Date 02/06/17     Precautions   Precautions Other (comment);Fall  osteoporosis   Precaution Comments osteoporosis, falls     Prior Function   Level of Independence Independent     Cognition   Overall Cognitive Status Within Functional Limits for tasks assessed     Observation/Other Assessments   Focus on Therapeutic Outcomes (FOTO)  57% limitation     Strength   Overall Strength Deficits   Overall Strength Comments bil flexion 4/5, abduction 4/5, ER 4/5, IR 4/5     Transfers   Five time sit to stand  comments  11 seconds                     [facility] Adult PT Treatment/Exercise - 06/17/17 0001      Knee/Hip Exercises: Aerobic   Nustep L4 x 10 min  Concurrent review of status     Knee/Hip Exercises: Standing   Heel Raises 2 sets;10 reps   Hip Abduction AROM;Stengthening;Both;2 sets;10 reps  cues for posture and TA contraction   Abduction Limitations emphasis on reduced UE support to challenge balance   Hip Extension AROM;Stengthening;Both;2 sets;10 reps  vc's for posture and TA contraction   Rebounder weight shifting 3 ways x 1 min each     Shoulder Exercises: Seated   Flexion Strengthening;20 reps;Both   Abduction Strengthening;Both;20 reps   Other Seated Exercises 3 way raises: 1# x 10 each     Shoulder Exercises: ROM/Strengthening   UBE (Upper Arm Bike) Level 1 x 6 minutes (3/3)                  PT Short Term Goals - 05/20/17 1201      PT SHORT TERM GOAL #1   Title be independent in initial HEP   Status Achieved     PT SHORT TERM GOAL #2   Title verbalize and demonstrate postural modifications and body mechanics modifications needed for spinal protection to prevent future fracture   Status Achieved     PT SHORT TERM GOAL #3   Title reduce pain to sit/stand/walk for > or = to 15 minutes without limitation   Baseline 30 minutes   Status Achieved     PT SHORT TERM GOAL #4   Title perform 5x sit to stand in < or = to 16 seconds to reduce risk of falls   Status Achieved           PT Long Term Goals - 06/17/17 1113      PT LONG TERM GOAL #1   Title independent with HEP and how to progress herself   Time 6   Period Weeks   Status On-going     PT LONG TERM GOAL #2   Title reduce FOTO to < or = to 39% limitation   Baseline 57%, reports 30% improvement     PT LONG TERM GOAL #3   Title return to walking for exercise 20-30 minutes without limitation due to pain   Baseline walking for exercise for 10-15 minutes   Time 6   Period Weeks    Status On-going     PT LONG TERM GOAL #4   Title reduce pain to stand/walk for > or = to 45 minutes without limitation   Baseline  standing 30 minutes, 2 hour sitting, walking 10-20 minutes   Time 6   Period Weeks   Status Revised     PT LONG TERM GOAL #5   Title demonstrate > or = to 4+/5 bil UE strength to improve endurance for cooking tasks   Baseline flexion 4/5, abduction 4/5, IR 4/5, ER 4/5   Time 6   Period Weeks   Status On-going     PT LONG TERM GOAL #6   Title perform 5x sit to stand in < or = to 13 seconds to reduce risk of falls   Baseline 11 seconds   Status Achieved               Plan - 06/17/17 1129    Clinical Impression Statement Pt with 30% overall improvement since the start of care.  Pt able to sit for 2 hours, stand for 30 minutes and walk for 10-20 minutes.  Pt with up to 4-5/10 thoracic pain that is intermittent and this is 30% improved.  Pt with continued UE weakness and endurance deficits and postural strength deficits.  Pt will continue to benefit from skilled PT for postural strength, UE srength and endurance and weight bearing activity to address balance and osteoporosis.     Rehab Potential Good   PT Frequency 2x / week   PT Duration 6 weeks   PT Treatment/Interventions ADLs/Self Care Home Management;Cryotherapy;Electrical Stimulation;Functional mobility training;Stair training;Gait training;Ultrasound;Moist Heat;Therapeutic activities;Therapeutic exercise;Balance training;Neuromuscular re-education;Patient/family education;Manual techniques;Taping   PT Next Visit Plan postural strength, upper extremity strength, balance exercises, pain management.    Recommended Other Services initial certification signed, recert sent 02/25/80   Consulted and Agree with Plan of Care Patient      Patient will benefit from skilled therapeutic intervention in order to improve the following deficits and impairments:  Postural dysfunction, Decreased strength, Impaired  flexibility, Improper body mechanics, Pain, Decreased activity tolerance, Decreased endurance, Increased muscle spasms, Impaired UE functional use, Decreased coordination  Visit Diagnosis: Pain in thoracic spine - Plan: PT plan of care cert/re-cert  Abnormal posture - Plan: PT plan of care cert/re-cert  Muscle weakness (generalized) - Plan: PT plan of care cert/re-cert  Repeated falls - Plan: PT plan of care cert/re-cert     Problem List Patient Active Problem List   Diagnosis Date Noted  . Coronary artery disease involving native coronary artery of native heart without angina pectoris 04/28/2017  . Syncope 04/07/2017  . Shortness of breath 04/07/2017  . Abnormal stress test 04/07/2017  . Sinus bradycardia 02/21/2017  . Coronary artery calcification 02/21/2017  . Compression fracture of thoracic vertebra ([place]) 02/08/2017  . Dizziness and giddiness 02/08/2017  . Atrial fibrillation with rapid ventricular response ([place]) 01/21/2016  . Colon polyp s/p appy/partialcecetomy 01/19/3016 01/19/2016  . Orthostatic dizziness 04/25/2014  . Vertigo 12/26/2012  . Hypertension, uncontrolled 09/11/2012  . Diastolic dysfunction 19/14/7829  . Sleep disturbance 09/17/2011  . Hypotension 12/26/2010  . Orthostatic hypotension 12/26/2010  . UTI'S, RECURRENT 11/20/2009  . ADVERSE REACTION TO MEDICATION 07/31/2009  . SCOLIOSIS 05/25/2009  . FATIGUE 05/25/2009  . DIVERTICULOSIS, COLON 02/23/2009  . COLONIC POLYPS, ADENOMATOUS, HX OF 02/23/2009  . PARATHYROIDECTOMY 01/02/2009  . NEPHROLITHIASIS, HX OF 11/15/2008  . PALPITATIONS, OCCASIONAL 07/12/2008  . Essential hypertension 02/04/2008  . Constipation 01/01/2008  . LEG PAIN, LEFT 01/01/2008  . OSTEOPOROSIS 01/01/2008  . HYPERPARATHYROIDISM UNSPECIFIED 10/20/2007  . HYPERLIPIDEMIA 07/07/2007  . ANEMIA-NOS 07/07/2007  . URINARY INCONTINENCE 07/07/2007   Sigurd Sos, PT 06/17/17 11:49 AM  [facility] [address] [place], [place], [address] Phone: [phone]   Fax:  [phone]  Name: SUANN KLIER MRN: [id number] Date of Birth: [birth year]

## 2017-06-23 ENCOUNTER — Encounter: Payer: Medicare Other | Admitting: Physical Therapy

## 2017-06-23 DIAGNOSIS — H52203 Unspecified astigmatism, bilateral: Secondary | ICD-10-CM | POA: Diagnosis not present

## 2017-06-23 DIAGNOSIS — H5000 Unspecified esotropia: Secondary | ICD-10-CM | POA: Diagnosis not present

## 2017-06-23 DIAGNOSIS — Z961 Presence of intraocular lens: Secondary | ICD-10-CM | POA: Diagnosis not present

## 2017-06-25 ENCOUNTER — Ambulatory Visit: Payer: Medicare Other | Attending: Sports Medicine | Admitting: Rehabilitative and Restorative Service Providers"

## 2017-06-25 DIAGNOSIS — R293 Abnormal posture: Secondary | ICD-10-CM | POA: Insufficient documentation

## 2017-06-25 DIAGNOSIS — R279 Unspecified lack of coordination: Secondary | ICD-10-CM | POA: Diagnosis not present

## 2017-06-25 DIAGNOSIS — R531 Weakness: Secondary | ICD-10-CM | POA: Diagnosis not present

## 2017-06-25 DIAGNOSIS — M6289 Other specified disorders of muscle: Secondary | ICD-10-CM | POA: Diagnosis not present

## 2017-06-25 DIAGNOSIS — R296 Repeated falls: Secondary | ICD-10-CM | POA: Diagnosis not present

## 2017-06-25 DIAGNOSIS — M546 Pain in thoracic spine: Secondary | ICD-10-CM | POA: Diagnosis not present

## 2017-06-25 DIAGNOSIS — M6281 Muscle weakness (generalized): Secondary | ICD-10-CM | POA: Diagnosis not present

## 2017-06-25 DIAGNOSIS — N8189 Other female genital prolapse: Secondary | ICD-10-CM | POA: Insufficient documentation

## 2017-06-25 NOTE — Therapy (Signed)
[facility] [address], [place], [place], [address] Phone: [phone]   Fax:  [phone]  Physical Therapy Treatment  Patient Details  Name: [name] MRN: [id number] Date of Birth: May 01, 1941 Referring Provider: Wandra Feinstein, MD  Encounter Date: 06/25/2017      PT End of Session - 06/25/17 1132    Visit Number 16   Number of Visits 20   Date for PT Re-Evaluation 08-Aug-2017   Authorization Type G-codes at 20, KX modifier needed   PT Start Time 1106   PT Stop Time 1154   PT Time Calculation (min) 48 min   Activity Tolerance Patient tolerated treatment well;No increased pain   Behavior During Therapy [facility] for tasks assessed/performed      Past Medical History:  Diagnosis Date  . Abdominal pain, unspecified site   . ADVERSE REACTION TO MEDICATION 07/31/2009  . ANEMIA-NOS 07/07/2007  . Arthritis   . Chest pain, unspecified   . Chronic kidney disease    hx of kidney stone  . COLONIC POLYPS, ADENOMATOUS, HX OF   . Disturbance of skin sensation   . DIVERTICULOSIS, COLON 02/23/2009  . Family history of diabetes mellitus   . Family history of malignant neoplasm of breast   . FATIGUE 05/25/2009  . Fever, unspecified   . Heart murmur    hx of  . HYPERLIPIDEMIA 07/07/2007  . HYPERPARATHYROIDISM UNSPECIFIED 10/20/2007  . HYPERTENSION 02/04/2008  . NEPHROLITHIASIS, HX OF 11/15/2008  . OSTEOPOROSIS 01/01/2008  . Other acquired absence of organ    parathyroidectomy  . Pain in limb   . PALPITATIONS, OCCASIONAL 07/12/2008  . PARATHYROIDECTOMY 01/02/2009  . SCOLIOSIS 05/25/2009  . Seasonal allergies   . Unspecified constipation   . URINARY INCONTINENCE 07/07/2007  . UTI'S, RECURRENT 11/20/2009   kimbrough     Past Surgical History:  Procedure Laterality Date  . BUNIONECTOMY    . COLONOSCOPY    . LAPAROSCOPIC APPENDECTOMY  01/19/2016   Procedure: APPENDECTOMY LAPAROSCOPIC with orifice of cecal polyp;  Surgeon: Leighton Ruff, MD;  Location: [facility];  Service: General;;  . LEFT HEART CATH AND CORONARY ANGIOGRAPHY N/A 04/07/2017   Procedure: Left Heart Cath and Coronary Angiography;  Surgeon: Nelva Bush, MD;  Location: [facility];  Service: Cardiovascular;  Laterality: N/A;  . PARATHYROIDECTOMY     Rt Superior open neck exploration  . POLYPECTOMY    . RIGHT OOPHORECTOMY    . TONSILLECTOMY      There were no vitals filed for this visit.      Subjective Assessment - 06/25/17 1128    Subjective My back feels more pronounced to pressure today. Even the back of the seat against it is sensitive.   Limitations Standing;Sitting;Walking   How long can you sit comfortably? 2 hours   How long can you stand comfortably? 30 minutes   How long can you walk comfortably? 30 min   Diagnostic tests x-ray to monitor healing   Patient Stated Goals improve strength, regain mobility/strength,    Currently in Pain? Yes   Pain Score 4    Pain Location Back   Pain Orientation Mid   Pain Descriptors / Indicators Dull   Pain Type Acute pain   Pain Onset More than a month ago   Pain Frequency Intermittent   Aggravating Factors  standing, walking   Pain Relieving Factors supine, ice   Multiple Pain Sites No  [facility] Adult PT Treatment/Exercise - 06/25/17 0001      Knee/Hip Exercises: Aerobic   Nustep L4x 10 min with PT verbal cues for tilt     Knee/Hip Exercises: Supine   Other Supine Knee/Hip Exercises SLR x 15, SLR/hip abdct combo x 15, hip flex/abdct combo x 20; long axis distraction x 15 with 2-3 sec hold; clam shell x 20; all performed with tilt     Shoulder Exercises: Supine   Other Supine Exercises bil scap retract isometric x 20; bil shoulder ext x 20; bil yellow theraband ER x 20, yellow theraband chest pull x 20; yellow theraband diagonal pull x 20 each ; isometric wall pushups x 5 sec, x 10 sec, x 15 sec; all performed with tilt     Shoulder Exercises:  Standing   Other Standing Exercises wall pushups x 15; STS from EOB x 10 with concentration on eccentric descent                  PT Short Term Goals - 05/20/17 1201      PT SHORT TERM GOAL #1   Title be independent in initial HEP   Status Achieved     PT SHORT TERM GOAL #2   Title verbalize and demonstrate postural modifications and body mechanics modifications needed for spinal protection to prevent future fracture   Status Achieved     PT SHORT TERM GOAL #3   Title reduce pain to sit/stand/walk for > or = to 15 minutes without limitation   Baseline 30 minutes   Status Achieved     PT SHORT TERM GOAL #4   Title perform 5x sit to stand in < or = to 16 seconds to reduce risk of falls   Status Achieved           PT Long Term Goals - 06/25/17 1134      PT LONG TERM GOAL #1   Title independent with HEP and how to progress herself   Time 6   Period Weeks   Status On-going     PT LONG TERM GOAL #2   Title reduce FOTO to < or = to 39% limitation   Time 8   Period Weeks   Status On-going     PT LONG TERM GOAL #3   Title return to walking for exercise 20-30 minutes without limitation due to pain with pt need to be able to go up a hill   Time 6   Period Weeks   Status Revised     PT LONG TERM GOAL #4   Title reduce pain to stand/walk for > or = to 45 minutes without limitation   Baseline standing 30 minutes, 2 hour sitting, walking 10-20 minutes   Time 6   Period Weeks   Status On-going     PT LONG TERM GOAL #5   Title demonstrate > or = to 4+/5 bil UE strength to improve endurance for cooking tasks   Baseline flexion 4/5, abduction 4/5, IR 4/5, ER 4/5   Time 6   Period Weeks   Status On-going     PT LONG TERM GOAL #6   Title perform 5x sit to stand in < or = to 13 seconds to reduce risk of falls   Time 8   Period Weeks   Status Achieved               Plan - 06/25/17 1132    Clinical Impression Statement Pt continues to report  improvements overall in back pain and overall functional strength. Pt would benefit from continued postural/abdominal strengthening, LE strengthening to assist with improved function, safety, and stability with all activities   Rehab Potential Good   PT Frequency 2x / week   PT Duration 6 weeks   PT Treatment/Interventions ADLs/Self Care Home Management;Cryotherapy;Electrical Stimulation;Functional mobility training;Stair training;Gait training;Ultrasound;Moist Heat;Therapeutic activities;Therapeutic exercise;Balance training;Neuromuscular re-education;Patient/family education;Manual techniques;Taping   PT Next Visit Plan postural strength, upper extremity strength, balance exercises, pain management.    Consulted and Agree with Plan of Care Patient      Patient will benefit from skilled therapeutic intervention in order to improve the following deficits and impairments:  Postural dysfunction, Decreased strength, Impaired flexibility, Improper body mechanics, Pain, Decreased activity tolerance, Decreased endurance, Increased muscle spasms, Impaired UE functional use, Decreased coordination  Visit Diagnosis: Pain in thoracic spine  Abnormal posture  Muscle weakness (generalized)  Repeated falls  Unspecified lack of coordination  Perineal floor weakness  Pelvic floor dysfunction  Weakness     Problem List Patient Active Problem List   Diagnosis Date Noted  . Coronary artery disease involving native coronary artery of native heart without angina pectoris 04/28/2017  . Syncope 04/07/2017  . Shortness of breath 04/07/2017  . Abnormal stress test 04/07/2017  . Sinus bradycardia 02/21/2017  . Coronary artery calcification 02/21/2017  . Compression fracture of thoracic vertebra ([place]) 02/08/2017  . Dizziness and giddiness 02/08/2017  . Atrial fibrillation with rapid ventricular response ([place]) 01/21/2016  . Colon polyp s/p appy/partialcecetomy 01/19/3016 01/19/2016  . Orthostatic  dizziness 04/25/2014  . Vertigo 12/26/2012  . Hypertension, uncontrolled 09/11/2012  . Diastolic dysfunction 13/06/6577  . Sleep disturbance 09/17/2011  . Hypotension 12/26/2010  . Orthostatic hypotension 12/26/2010  . UTI'S, RECURRENT 11/20/2009  . ADVERSE REACTION TO MEDICATION 07/31/2009  . SCOLIOSIS 05/25/2009  . FATIGUE 05/25/2009  . DIVERTICULOSIS, COLON 02/23/2009  . COLONIC POLYPS, ADENOMATOUS, HX OF 02/23/2009  . PARATHYROIDECTOMY 01/02/2009  . NEPHROLITHIASIS, HX OF 11/15/2008  . PALPITATIONS, OCCASIONAL 07/12/2008  . Essential hypertension 02/04/2008  . Constipation 01/01/2008  . LEG PAIN, LEFT 01/01/2008  . OSTEOPOROSIS 01/01/2008  . HYPERPARATHYROIDISM UNSPECIFIED 10/20/2007  . HYPERLIPIDEMIA 07/07/2007  . ANEMIA-NOS 07/07/2007  . URINARY INCONTINENCE 07/07/2007    Bryen Hinderman,Barry Faircloth, PT 06/25/2017, 11:55 AM  [facility] [address], [place], [place], [address] Phone: [phone]   Fax:  [phone]  Name: BOLUWATIFE MUTCHLER MRN: [id number] Date of Birth: [birth year]

## 2017-06-27 ENCOUNTER — Encounter: Payer: Medicare Other | Admitting: Rehabilitation

## 2017-06-30 ENCOUNTER — Ambulatory Visit: Payer: Medicare Other | Admitting: Rehabilitative and Restorative Service Providers"

## 2017-06-30 DIAGNOSIS — R531 Weakness: Secondary | ICD-10-CM

## 2017-06-30 DIAGNOSIS — R293 Abnormal posture: Secondary | ICD-10-CM | POA: Diagnosis not present

## 2017-06-30 DIAGNOSIS — M6281 Muscle weakness (generalized): Secondary | ICD-10-CM

## 2017-06-30 DIAGNOSIS — R279 Unspecified lack of coordination: Secondary | ICD-10-CM | POA: Diagnosis not present

## 2017-06-30 DIAGNOSIS — M546 Pain in thoracic spine: Secondary | ICD-10-CM

## 2017-06-30 DIAGNOSIS — N8189 Other female genital prolapse: Secondary | ICD-10-CM | POA: Diagnosis not present

## 2017-06-30 DIAGNOSIS — R296 Repeated falls: Secondary | ICD-10-CM | POA: Diagnosis not present

## 2017-06-30 NOTE — Therapy (Signed)
[facility] [address] [place], [place], [address] Phone: [phone]   Fax:  [phone]  Physical Therapy Treatment  Patient Details  Name: [name] MRN: [id number] Date of Birth: 09-30-1941 Referring Provider: Wandra Feinstein, MD  Encounter Date: 06/30/2017      PT End of Session - 06/30/17 1419    Visit Number 17   Number of Visits 20   Date for PT Re-Evaluation 07/29/17   PT Start Time 0206   PT Stop Time 0249   PT Time Calculation (min) 43 min      Past Medical History:  Diagnosis Date  . Abdominal pain, unspecified site   . ADVERSE REACTION TO MEDICATION 07/31/2009  . ANEMIA-NOS 07/07/2007  . Arthritis   . Chest pain, unspecified   . Chronic kidney disease    hx of kidney stone  . COLONIC POLYPS, ADENOMATOUS, HX OF   . Disturbance of skin sensation   . DIVERTICULOSIS, COLON 02/23/2009  . Family history of diabetes mellitus   . Family history of malignant neoplasm of breast   . FATIGUE 05/25/2009  . Fever, unspecified   . Heart murmur    hx of  . HYPERLIPIDEMIA 07/07/2007  . HYPERPARATHYROIDISM UNSPECIFIED 10/20/2007  . HYPERTENSION 02/04/2008  . NEPHROLITHIASIS, HX OF 11/15/2008  . OSTEOPOROSIS 01/01/2008  . Other acquired absence of organ    parathyroidectomy  . Pain in limb   . PALPITATIONS, OCCASIONAL 07/12/2008  . PARATHYROIDECTOMY 01/02/2009  . SCOLIOSIS 05/25/2009  . Seasonal allergies   . Unspecified constipation   . URINARY INCONTINENCE 07/07/2007  . UTI'S, RECURRENT 11/20/2009   kimbrough     Past Surgical History:  Procedure Laterality Date  . BUNIONECTOMY    . COLONOSCOPY    . LAPAROSCOPIC APPENDECTOMY  01/19/2016   Procedure: APPENDECTOMY LAPAROSCOPIC with orifice of cecal polyp;  Surgeon: Leighton Ruff, MD;  Location: [facility];  Service: General;;  . LEFT HEART CATH AND CORONARY ANGIOGRAPHY N/A 04/07/2017   Procedure: Left Heart Cath and Coronary Angiography;  Surgeon: Nelva Bush, MD;  Location: [facility];  Service: Cardiovascular;  Laterality: N/A;  . PARATHYROIDECTOMY     Rt Superior open neck exploration  . POLYPECTOMY    . RIGHT OOPHORECTOMY    . TONSILLECTOMY      There were no vitals filed for this visit.      Subjective Assessment - 06/30/17 1409    Subjective was really sore after last treatment but it went away; discussed muscle soreness with pt and that movement is the best soreness reliever.    Limitations Standing;Sitting;Walking   Currently in Pain? No/denies   Pain Score 0-No pain                         [facility] Adult PT Treatment/Exercise - 06/30/17 0001      Knee/Hip Exercises: Stretches   Other Knee/Hip Stretches seated trunk flexion 2x15 sec each in neutral and in diagonal each     Knee/Hip Exercises: Standing   Other Standing Knee Exercises 6 inch frontal step ups with tilt x 20; tilt with lateral step ups x 20; scap retract 3 varying angles facing wall (reverse wall angel) x 10; tilt with glute set and unilat hip ext bil x 15     Knee/Hip Exercises: Supine   Other Supine Knee/Hip Exercises all with tilt: tilt with heel slide x 15, tilt with SLR x 15, with clam shell  x 20; tilt with bil shoulder depression x 20; tilt with hip flex/abdct combo with knee flexed x 15; tilt with yellow band chest pull x 15, tilt with yellow band bil ER x 20; tilt with yellow band row x 20; tilt with unilat scap depression with yellow band facilitating oblique crunch x 15; hip alphabet 1 set capital letters; yellow theraband unilat shoulder ext x 15                  PT Short Term Goals - 05/20/17 1201      PT SHORT TERM GOAL #1   Title be independent in initial HEP   Status Achieved     PT SHORT TERM GOAL #2   Title verbalize and demonstrate postural modifications and body mechanics modifications needed for spinal protection to prevent future fracture   Status Achieved     PT SHORT TERM GOAL #3   Title  reduce pain to sit/stand/walk for > or = to 15 minutes without limitation   Baseline 30 minutes   Status Achieved     PT SHORT TERM GOAL #4   Title perform 5x sit to stand in < or = to 16 seconds to reduce risk of falls   Status Achieved           PT Long Term Goals - 06/30/17 1422      PT LONG TERM GOAL #1   Title independent with HEP and how to progress herself   Time 6   Period Weeks   Status On-going     PT LONG TERM GOAL #2   Title reduce FOTO to < or = to 39% limitation   Time 8   Period Weeks   Status On-going     PT LONG TERM GOAL #3   Title return to walking for exercise 20-30 minutes without limitation due to pain with pt need to be able to go up a hill   Time 6   Period Weeks   Status On-going     PT LONG TERM GOAL #4   Title reduce pain to stand/walk for > or = to 45 minutes without limitation   Time 6   Period Weeks   Status On-going     PT LONG TERM GOAL #5   Title demonstrate > or = to 4+/5 bil UE strength to improve endurance for cooking tasks   Time 6   Period Weeks   Status On-going     PT LONG TERM GOAL #6   Title perform 5x sit to stand in < or = to 13 seconds to reduce risk of falls   Time 8   Status Achieved               Plan - 06/30/17 1420    Clinical Impression Statement Pt reports improvement in back pain and postural awareness. Pt would benefit from continued lumbar/core/postural strengthening, LE strengthening to assist with improved mobility and decreased pain.    Rehab Potential Good   PT Frequency 2x / week   PT Duration 6 weeks   PT Treatment/Interventions ADLs/Self Care Home Management;Cryotherapy;Electrical Stimulation;Functional mobility training;Stair training;Gait training;Ultrasound;Moist Heat;Therapeutic activities;Therapeutic exercise;Balance training;Neuromuscular re-education;Patient/family education;Manual techniques;Taping   PT Next Visit Plan postural strength, upper extremity strength, balance exercises,  pain management.    Consulted and Agree with Plan of Care Patient      Patient will benefit from skilled therapeutic intervention in order to improve the following deficits and impairments:  Postural dysfunction, Decreased strength,  Impaired flexibility, Improper body mechanics, Pain, Decreased activity tolerance, Decreased endurance, Increased muscle spasms, Impaired UE functional use, Decreased coordination  Visit Diagnosis: Pain in thoracic spine  Abnormal posture  Muscle weakness (generalized)  Repeated falls  Weakness     Problem List Patient Active Problem List   Diagnosis Date Noted  . Coronary artery disease involving native coronary artery of native heart without angina pectoris 04/28/2017  . Syncope 04/07/2017  . Shortness of breath 04/07/2017  . Abnormal stress test 04/07/2017  . Sinus bradycardia 02/21/2017  . Coronary artery calcification 02/21/2017  . Compression fracture of thoracic vertebra ([place]) 02/08/2017  . Dizziness and giddiness 02/08/2017  . Atrial fibrillation with rapid ventricular response ([place]) 01/21/2016  . Colon polyp s/p appy/partialcecetomy 01/19/3016 01/19/2016  . Orthostatic dizziness 04/25/2014  . Vertigo 12/26/2012  . Hypertension, uncontrolled 09/11/2012  . Diastolic dysfunction 16/08/9603  . Sleep disturbance 09/17/2011  . Hypotension 12/26/2010  . Orthostatic hypotension 12/26/2010  . UTI'S, RECURRENT 11/20/2009  . ADVERSE REACTION TO MEDICATION 07/31/2009  . SCOLIOSIS 05/25/2009  . FATIGUE 05/25/2009  . DIVERTICULOSIS, COLON 02/23/2009  . COLONIC POLYPS, ADENOMATOUS, HX OF 02/23/2009  . PARATHYROIDECTOMY 01/02/2009  . NEPHROLITHIASIS, HX OF 11/15/2008  . PALPITATIONS, OCCASIONAL 07/12/2008  . Essential hypertension 02/04/2008  . Constipation 01/01/2008  . LEG PAIN, LEFT 01/01/2008  . OSTEOPOROSIS 01/01/2008  . HYPERPARATHYROIDISM UNSPECIFIED 10/20/2007  . HYPERLIPIDEMIA 07/07/2007  . ANEMIA-NOS 07/07/2007  . URINARY  INCONTINENCE 07/07/2007    [name], PT 06/30/2017, 2:48 PM  [facility] [address] [place], [place], [address] Phone: [phone]   Fax:  [phone]  Name: [name] MRN: [id number] Date of Birth: 1941/05/24

## 2017-07-03 ENCOUNTER — Ambulatory Visit: Payer: Medicare Other | Admitting: Physical Therapy

## 2017-07-03 DIAGNOSIS — M6281 Muscle weakness (generalized): Secondary | ICD-10-CM

## 2017-07-03 DIAGNOSIS — R279 Unspecified lack of coordination: Secondary | ICD-10-CM | POA: Diagnosis not present

## 2017-07-03 DIAGNOSIS — M546 Pain in thoracic spine: Secondary | ICD-10-CM

## 2017-07-03 DIAGNOSIS — R296 Repeated falls: Secondary | ICD-10-CM | POA: Diagnosis not present

## 2017-07-03 DIAGNOSIS — N8189 Other female genital prolapse: Secondary | ICD-10-CM | POA: Diagnosis not present

## 2017-07-03 DIAGNOSIS — R293 Abnormal posture: Secondary | ICD-10-CM | POA: Diagnosis not present

## 2017-07-03 NOTE — Patient Instructions (Signed)
  Abduction: Clam (Eccentric) - Side-Lying   Lie on side with knees bent. Lift top knee, keeping feet together. Keep trunk steady. Slowly lower for 3-5 seconds. _10__ reps per set, __1_ sets per day, _7__ days per week.      Copyright  VHI. All rights reserved.       Seated core exercises:  1 or 2# weights:  Hip to hip, shoulder to hip;  V ;  Ear to ear 10x each   [facility] [address] [place], [place] [address] Phone # [phone] Fax [phone]

## 2017-07-03 NOTE — Therapy (Signed)
[facility] [address] [place], [place], [address] Phone: [phone]   Fax:  [phone]  Physical Therapy Treatment  Patient Details  Name: [name] MRN: [id number] Date of Birth: 01/09/1941 Referring Provider: Wandra Feinstein, MD  Encounter Date: 07/03/2017      PT End of Session - 07/03/17 2245    Visit Number 18   Number of Visits 20   Date for PT Re-Evaluation 08-01-17   Authorization Type G-codes at 20, KX modifier needed   PT Start Time 1400   PT Stop Time 1445   PT Time Calculation (min) 45 min   Activity Tolerance Patient tolerated treatment well      Past Medical History:  Diagnosis Date  . Abdominal pain, unspecified site   . ADVERSE REACTION TO MEDICATION 07/31/2009  . ANEMIA-NOS 07/07/2007  . Arthritis   . Chest pain, unspecified   . Chronic kidney disease    hx of kidney stone  . COLONIC POLYPS, ADENOMATOUS, HX OF   . Disturbance of skin sensation   . DIVERTICULOSIS, COLON 02/23/2009  . Family history of diabetes mellitus   . Family history of malignant neoplasm of breast   . FATIGUE 05/25/2009  . Fever, unspecified   . Heart murmur    hx of  . HYPERLIPIDEMIA 07/07/2007  . HYPERPARATHYROIDISM UNSPECIFIED 10/20/2007  . HYPERTENSION 02/04/2008  . NEPHROLITHIASIS, HX OF 11/15/2008  . OSTEOPOROSIS 01/01/2008  . Other acquired absence of organ    parathyroidectomy  . Pain in limb   . PALPITATIONS, OCCASIONAL 07/12/2008  . PARATHYROIDECTOMY 01/02/2009  . SCOLIOSIS 05/25/2009  . Seasonal allergies   . Unspecified constipation   . URINARY INCONTINENCE 07/07/2007  . UTI'S, RECURRENT 11/20/2009   kimbrough     Past Surgical History:  Procedure Laterality Date  . BUNIONECTOMY    . COLONOSCOPY    . LAPAROSCOPIC APPENDECTOMY  01/19/2016   Procedure: APPENDECTOMY LAPAROSCOPIC with orifice of cecal polyp;  Surgeon: Leighton Ruff, MD;  [facility];  Service: General;;  . LEFT HEART CATH AND  CORONARY ANGIOGRAPHY N/A 04/07/2017   Procedure: Left Heart Cath and Coronary Angiography;  Surgeon: Nelva Bush, MD;  Location: [facility];  Service: Cardiovascular;  Laterality: N/A;  . PARATHYROIDECTOMY     Rt Superior open neck exploration  . POLYPECTOMY    . RIGHT OOPHORECTOMY    . TONSILLECTOMY      There were no vitals filed for this visit.      Subjective Assessment - 07/03/17 1358    Subjective Doing a little bit better but it is slow.  The pain will go away but anything high I do with my arms will trigger it.  The exercises help a lot.  I need a written home ex program.     Pertinent History osteoporosis. T11 compression fracture s/p fall 02/06/17   How long can you walk comfortably? 1/2 hour but I haven't tried in a long time   Patient Stated Goals stand to cook dinner   Currently in Pain? No/denies   Pain Score 0-No pain   Pain Location Back   Pain Orientation Mid   Pain Type Chronic pain   Aggravating Factors  leaning against hard back chair;  standing, walking; reaching arms up   Pain Relieving Factors sitting; lying down;  ice                         [facility] Adult  PT Treatment/Exercise - 07/03/17 0001      Therapeutic Activites    Therapeutic Activities Other Therapeutic Activities   Other Therapeutic Activities reaching, standing, walking erect     Neuro Re-ed    Neuro Re-ed Details  glute medius and transverse abdominus, latissimus dorsi, middle and lower trap activation     Knee/Hip Exercises: Aerobic   Nustep L 1 10 min  LEs only     Knee/Hip Exercises: Sidelying   Clams 10x right and left   Other Sidelying Knee/Hip Exercises UE abduction 10x right/left     Shoulder Exercises: Seated   Other Seated Exercises 2# plyo ball: hip to hip, shoulder to hip, Vs, ear to ear 10x each   Other Seated Exercises foam roll push down  10x     Shoulder Exercises: Standing   Extension Strengthening;Both;10 reps;Theraband   Theraband Level  (Shoulder Extension) Level 1 (Yellow)   Row Strengthening;Both;10 reps;Theraband   Theraband Level (Shoulder Row) Level 1 (Yellow)   Other Standing Exercises UEs on UE Ranger on wall L5, 10 12 5x each                PT Education - 07/03/17 2241    Education provided Yes   Education Details clams, sidelying shoulder abduction;  seated core ex; standing rows and extensions   Person(s) Educated Patient   Methods Explanation;Demonstration;Handout   Comprehension Verbalized understanding;Returned demonstration          PT Short Term Goals - 07/03/17 2251      PT SHORT TERM GOAL #1   Title be independent in initial HEP   Status Achieved     PT SHORT TERM GOAL #2   Title verbalize and demonstrate postural modifications and body mechanics modifications needed for spinal protection to prevent future fracture   Status Achieved     PT SHORT TERM GOAL #3   Title reduce pain to sit/stand/walk for > or = to 15 minutes without limitation   Status Achieved     PT SHORT TERM GOAL #4   Title perform 5x sit to stand in < or = to 16 seconds to reduce risk of falls   Status Achieved           PT Long Term Goals - 07/03/17 2251      PT LONG TERM GOAL #1   Title independent with HEP and how to progress herself   Time 6   Period Weeks   Status On-going     PT LONG TERM GOAL #2   Title reduce FOTO to < or = to 39% limitation   Time 8   Period Weeks   Status On-going     PT LONG TERM GOAL #3   Title return to walking for exercise 20-30 minutes without limitation due to pain with pt need to be able to go up a hill   Time 6   Period Weeks   Status On-going     PT LONG TERM GOAL #4   Title reduce pain to stand/walk for > or = to 45 minutes without limitation   Time 6   Period Weeks   Status On-going     PT LONG TERM GOAL #5   Title demonstrate > or = to 4+/5 bil UE strength to improve endurance for cooking tasks   Time 6   Period Weeks   Status On-going     PT LONG  TERM GOAL #6   Title perform 5x sit to stand in <  or = to 13 seconds to reduce risk of falls   Status Achieved               Plan - 07/03/17 2246    Clinical Impression Statement The patient reports she is progressing but "very slowly" since her initial problem began in March.  She continues to have pain with elevating her arms, lying supine and standing and walking.  She is better able to tolerate standing exercises with knees bent and transverse abdominus muscle draw-in.  Therapist closely monitoring pain response and modifying exercises accordingly.     Rehab Potential Good   PT Frequency 2x / week   PT Duration 6 weeks   PT Treatment/Interventions ADLs/Self Care Home Management;Cryotherapy;Electrical Stimulation;Functional mobility training;Stair training;Gait training;Ultrasound;Moist Heat;Therapeutic activities;Therapeutic exercise;Balance training;Neuromuscular re-education;Patient/family education;Manual techniques;Taping   PT Next Visit Plan postural strength, upper extremity strength, balance exercises, pain management;  KX modifiers;  add to HEP      Patient will benefit from skilled therapeutic intervention in order to improve the following deficits and impairments:  Postural dysfunction, Decreased strength, Impaired flexibility, Improper body mechanics, Pain, Decreased activity tolerance, Decreased endurance, Increased muscle spasms, Impaired UE functional use, Decreased coordination  Visit Diagnosis: Pain in thoracic spine  Abnormal posture  Muscle weakness (generalized)     Problem List Patient Active Problem List   Diagnosis Date Noted  . Coronary artery disease involving native coronary artery of native heart without angina pectoris 04/28/2017  . Syncope 04/07/2017  . Shortness of breath 04/07/2017  . Abnormal stress test 04/07/2017  . Sinus bradycardia 02/21/2017  . Coronary artery calcification 02/21/2017  . Compression fracture of thoracic vertebra  ([place]) 02/08/2017  . Dizziness and giddiness 02/08/2017  . Atrial fibrillation with rapid ventricular response ([place]) 01/21/2016  . Colon polyp s/p appy/partialcecetomy 01/19/3016 01/19/2016  . Orthostatic dizziness 04/25/2014  . Vertigo 12/26/2012  . Hypertension, uncontrolled 09/11/2012  . Diastolic dysfunction 19/41/7408  . Sleep disturbance 09/17/2011  . Hypotension 12/26/2010  . Orthostatic hypotension 12/26/2010  . UTI'S, RECURRENT 11/20/2009  . ADVERSE REACTION TO MEDICATION 07/31/2009  . SCOLIOSIS 05/25/2009  . FATIGUE 05/25/2009  . DIVERTICULOSIS, COLON 02/23/2009  . COLONIC POLYPS, ADENOMATOUS, HX OF 02/23/2009  . PARATHYROIDECTOMY 01/02/2009  . NEPHROLITHIASIS, HX OF 11/15/2008  . PALPITATIONS, OCCASIONAL 07/12/2008  . Essential hypertension 02/04/2008  . Constipation 01/01/2008  . LEG PAIN, LEFT 01/01/2008  . OSTEOPOROSIS 01/01/2008  . HYPERPARATHYROIDISM UNSPECIFIED 10/20/2007  . HYPERLIPIDEMIA 07/07/2007  . ANEMIA-NOS 07/07/2007  . URINARY INCONTINENCE 07/07/2007   Ruben Im, PT 07/03/17 10:54 PM Phone: [phone] Fax: [phone]  Alvera Singh 07/03/2017, 10:53 PM  [facility] [address], [place], [place], [address] Phone: [phone]   Fax:  [phone]  Name: XIN KLAWITTER MRN: [id number] Date of Birth: October 08, 1941

## 2017-07-08 ENCOUNTER — Encounter: Payer: Self-pay | Admitting: Rehabilitation

## 2017-07-08 ENCOUNTER — Ambulatory Visit: Payer: Medicare Other | Admitting: Rehabilitation

## 2017-07-08 DIAGNOSIS — R293 Abnormal posture: Secondary | ICD-10-CM | POA: Diagnosis not present

## 2017-07-08 DIAGNOSIS — M546 Pain in thoracic spine: Secondary | ICD-10-CM | POA: Diagnosis not present

## 2017-07-08 DIAGNOSIS — R296 Repeated falls: Secondary | ICD-10-CM | POA: Diagnosis not present

## 2017-07-08 DIAGNOSIS — N8189 Other female genital prolapse: Secondary | ICD-10-CM | POA: Diagnosis not present

## 2017-07-08 DIAGNOSIS — M6281 Muscle weakness (generalized): Secondary | ICD-10-CM

## 2017-07-08 DIAGNOSIS — R279 Unspecified lack of coordination: Secondary | ICD-10-CM | POA: Diagnosis not present

## 2017-07-08 NOTE — Therapy (Signed)
[facility] [address], [place], [place], [address] Phone: [phone]   Fax:  [phone]  Physical Therapy Treatment  Patient Details  Name: [name] MRN: [id number] Date of Birth: 02/17/1941 Referring Provider: Wandra Feinstein, MD  Encounter Date: 07/08/2017      PT End of Session - 07/08/17 0940    Visit Number 19   Number of Visits 20   Date for PT Re-Evaluation 08-24-2017   Authorization Type G-codes at 20, KX modifier needed   PT Start Time 0930   PT Stop Time 1011   PT Time Calculation (min) 41 min   Activity Tolerance Patient tolerated treatment well   Behavior During Therapy [facility] for tasks assessed/performed      Past Medical History:  Diagnosis Date  . Abdominal pain, unspecified site   . ADVERSE REACTION TO MEDICATION 07/31/2009  . ANEMIA-NOS 07/07/2007  . Arthritis   . Chest pain, unspecified   . Chronic kidney disease    hx of kidney stone  . COLONIC POLYPS, ADENOMATOUS, HX OF   . Disturbance of skin sensation   . DIVERTICULOSIS, COLON 02/23/2009  . Family history of diabetes mellitus   . Family history of malignant neoplasm of breast   . FATIGUE 05/25/2009  . Fever, unspecified   . Heart murmur    hx of  . HYPERLIPIDEMIA 07/07/2007  . HYPERPARATHYROIDISM UNSPECIFIED 10/20/2007  . HYPERTENSION 02/04/2008  . NEPHROLITHIASIS, HX OF 11/15/2008  . OSTEOPOROSIS 01/01/2008  . Other acquired absence of organ    parathyroidectomy  . Pain in limb   . PALPITATIONS, OCCASIONAL 07/12/2008  . PARATHYROIDECTOMY 01/02/2009  . SCOLIOSIS 05/25/2009  . Seasonal allergies   . Unspecified constipation   . URINARY INCONTINENCE 07/07/2007  . UTI'S, RECURRENT 11/20/2009   kimbrough     Past Surgical History:  Procedure Laterality Date  . BUNIONECTOMY    . COLONOSCOPY    . LAPAROSCOPIC APPENDECTOMY  01/19/2016   Procedure: APPENDECTOMY LAPAROSCOPIC with orifice of cecal polyp;  Surgeon: Leighton Ruff, MD;   Location: [facility];  Service: General;;  . LEFT HEART CATH AND CORONARY ANGIOGRAPHY N/A 04/07/2017   Procedure: Left Heart Cath and Coronary Angiography;  Surgeon: Nelva Bush, MD;  Location: [place] [facility];  Service: Cardiovascular;  Laterality: N/A;  . PARATHYROIDECTOMY     Rt Superior open neck exploration  . POLYPECTOMY    . RIGHT OOPHORECTOMY    . TONSILLECTOMY      There were no vitals filed for this visit.      Subjective Assessment - 07/08/17 0932    Subjective A bit of lower back pain today.  unrelated    Pain Score 3    Pain Location Back   Pain Orientation Right;Lower   Pain Descriptors / Indicators Dull   Pain Type Chronic pain                         [facility] Adult PT Treatment/Exercise - 07/08/17 0001      Knee/Hip Exercises: Aerobic   Nustep L2x57min  with ice     Knee/Hip Exercises: Sidelying   Clams 10x right and left   Other Sidelying Knee/Hip Exercises UE abduction 10x right/left     Shoulder Exercises: Supine   Other Supine Exercises clam red band x 15, leg press into table 6"x10bil, mini bridge x 15, SLR x 10bil with TrA     Shoulder Exercises: Seated   Other Seated  Exercises 2# plyo ball: hip to hip, shoulder to hip, Vs, 10x each, D2, flexion   Other Seated Exercises foam roll push down  10x     Shoulder Exercises: Standing   Extension Strengthening;Both;Theraband;15 reps   Theraband Level (Shoulder Extension) Level 1 (Yellow)   Row Strengthening;Both;Theraband;15 reps   Theraband Level (Shoulder Row) Level 1 (Yellow)                  PT Short Term Goals - 07/03/17 2251      PT SHORT TERM GOAL #1   Title be independent in initial HEP   Status Achieved     PT SHORT TERM GOAL #2   Title verbalize and demonstrate postural modifications and body mechanics modifications needed for spinal protection to prevent future fracture   Status Achieved     PT SHORT TERM GOAL #3   Title reduce pain to sit/stand/walk for >  or = to 15 minutes without limitation   Status Achieved     PT SHORT TERM GOAL #4   Title perform 5x sit to stand in < or = to 16 seconds to reduce risk of falls   Status Achieved           PT Long Term Goals - 07/03/17 2251      PT LONG TERM GOAL #1   Title independent with HEP and how to progress herself   Time 6   Period Weeks   Status On-going     PT LONG TERM GOAL #2   Title reduce FOTO to < or = to 39% limitation   Time 8   Period Weeks   Status On-going     PT LONG TERM GOAL #3   Title return to walking for exercise 20-30 minutes without limitation due to pain with pt need to be able to go up a hill   Time 6   Period Weeks   Status On-going     PT LONG TERM GOAL #4   Title reduce pain to stand/walk for > or = to 45 minutes without limitation   Time 6   Period Weeks   Status On-going     PT LONG TERM GOAL #5   Title demonstrate > or = to 4+/5 bil UE strength to improve endurance for cooking tasks   Time 6   Period Weeks   Status On-going     PT LONG TERM GOAL #6   Title perform 5x sit to stand in < or = to 13 seconds to reduce risk of falls   Status Achieved               Plan - 07/08/17 1020    Clinical Impression Statement Tolerated all well.  Some tenderness and increased tone in the R QL today most likely due to scoliotic posture.  No need for exercise modification today    PT Frequency 2x / week   PT Duration 6 weeks   PT Treatment/Interventions ADLs/Self Care Home Management;Cryotherapy;Electrical Stimulation;Functional mobility training;Stair training;Gait training;Ultrasound;Moist Heat;Therapeutic activities;Therapeutic exercise;Balance training;Neuromuscular re-education;Patient/family education;Manual techniques;Taping   PT Next Visit Plan postural strength, upper extremity strength, balance exercises, pain management;  KX modifiers;  add to HEP      Patient will benefit from skilled therapeutic intervention in order to improve the  following deficits and impairments:  Postural dysfunction, Decreased strength, Impaired flexibility, Improper body mechanics, Pain, Decreased activity tolerance, Decreased endurance, Increased muscle spasms, Impaired UE functional use, Decreased coordination  Visit Diagnosis: Pain  in thoracic spine  Abnormal posture  Muscle weakness (generalized)  Repeated falls     Problem List Patient Active Problem List   Diagnosis Date Noted  . Coronary artery disease involving native coronary artery of native heart without angina pectoris 04/28/2017  . Syncope 04/07/2017  . Shortness of breath 04/07/2017  . Abnormal stress test 04/07/2017  . Sinus bradycardia 02/21/2017  . Coronary artery calcification 02/21/2017  . Compression fracture of thoracic vertebra ([place]) 02/08/2017  . Dizziness and giddiness 02/08/2017  . Atrial fibrillation with rapid ventricular response ([place]) 01/21/2016  . Colon polyp s/p appy/partialcecetomy 01/19/3016 01/19/2016  . Orthostatic dizziness 04/25/2014  . Vertigo 12/26/2012  . Hypertension, uncontrolled 09/11/2012  . Diastolic dysfunction 38/37/7939  . Sleep disturbance 09/17/2011  . Hypotension 12/26/2010  . Orthostatic hypotension 12/26/2010  . UTI'S, RECURRENT 11/20/2009  . ADVERSE REACTION TO MEDICATION 07/31/2009  . SCOLIOSIS 05/25/2009  . FATIGUE 05/25/2009  . DIVERTICULOSIS, COLON 02/23/2009  . COLONIC POLYPS, ADENOMATOUS, HX OF 02/23/2009  . PARATHYROIDECTOMY 01/02/2009  . NEPHROLITHIASIS, HX OF 11/15/2008  . PALPITATIONS, OCCASIONAL 07/12/2008  . Essential hypertension 02/04/2008  . Constipation 01/01/2008  . LEG PAIN, LEFT 01/01/2008  . OSTEOPOROSIS 01/01/2008  . HYPERPARATHYROIDISM UNSPECIFIED 10/20/2007  . HYPERLIPIDEMIA 07/07/2007  . ANEMIA-NOS 07/07/2007  . URINARY INCONTINENCE 07/07/2007    Stark Bray, DPT, CMP 07/08/2017, 10:25 AM  [facility] [address] [place], [place], [address] Phone: [phone]   Fax:  [phone]  Name: [name] MRN: [id number] Date of Birth: 07-07-1941

## 2017-07-10 ENCOUNTER — Encounter: Payer: Self-pay | Admitting: Physical Therapy

## 2017-07-10 ENCOUNTER — Ambulatory Visit: Payer: Medicare Other | Admitting: Physical Therapy

## 2017-07-10 DIAGNOSIS — R296 Repeated falls: Secondary | ICD-10-CM | POA: Diagnosis not present

## 2017-07-10 DIAGNOSIS — M6281 Muscle weakness (generalized): Secondary | ICD-10-CM | POA: Diagnosis not present

## 2017-07-10 DIAGNOSIS — M546 Pain in thoracic spine: Secondary | ICD-10-CM

## 2017-07-10 DIAGNOSIS — R293 Abnormal posture: Secondary | ICD-10-CM

## 2017-07-10 DIAGNOSIS — R279 Unspecified lack of coordination: Secondary | ICD-10-CM | POA: Diagnosis not present

## 2017-07-10 DIAGNOSIS — N8189 Other female genital prolapse: Secondary | ICD-10-CM | POA: Diagnosis not present

## 2017-07-10 NOTE — Patient Instructions (Signed)
Massage: [employer] - [place] or anyone there   [place], [place], [place] [address]

## 2017-07-10 NOTE — Therapy (Signed)
[facility] [address] [place], [place], [address] Phone: [phone]   Fax:  [phone]  Physical Therapy Treatment  Patient Details  Name: [name] MRN: [id number] Date of Birth: Apr 24, 1941 Referring Provider: Wandra Feinstein, MD  Encounter Date: 07/10/2017      PT End of Session - 07/10/17 1149    Visit Number 20   Number of Visits 30   Date for PT Re-Evaluation August 22, 2017   Authorization Type G-codes at 20, KX modifier needed   PT Start Time 0933   PT Stop Time 1015   PT Time Calculation (min) 42 min   Activity Tolerance Patient tolerated treatment well   Behavior During Therapy [facility] for tasks assessed/performed      Past Medical History:  Diagnosis Date  . Abdominal pain, unspecified site   . ADVERSE REACTION TO MEDICATION 07/31/2009  . ANEMIA-NOS 07/07/2007  . Arthritis   . Chest pain, unspecified   . Chronic kidney disease    hx of kidney stone  . COLONIC POLYPS, ADENOMATOUS, HX OF   . Disturbance of skin sensation   . DIVERTICULOSIS, COLON 02/23/2009  . Family history of diabetes mellitus   . Family history of malignant neoplasm of breast   . FATIGUE 05/25/2009  . Fever, unspecified   . Heart murmur    hx of  . HYPERLIPIDEMIA 07/07/2007  . HYPERPARATHYROIDISM UNSPECIFIED 10/20/2007  . HYPERTENSION 02/04/2008  . NEPHROLITHIASIS, HX OF 11/15/2008  . OSTEOPOROSIS 01/01/2008  . Other acquired absence of organ    parathyroidectomy  . Pain in limb   . PALPITATIONS, OCCASIONAL 07/12/2008  . PARATHYROIDECTOMY 01/02/2009  . SCOLIOSIS 05/25/2009  . Seasonal allergies   . Unspecified constipation   . URINARY INCONTINENCE 07/07/2007  . UTI'S, RECURRENT 11/20/2009   kimbrough     Past Surgical History:  Procedure Laterality Date  . BUNIONECTOMY    . COLONOSCOPY    . LAPAROSCOPIC APPENDECTOMY  01/19/2016   Procedure: APPENDECTOMY LAPAROSCOPIC with orifice of cecal polyp;  Surgeon: Leighton Ruff, MD;   Location[facility];  Service: General;;  . LEFT HEART CATH AND CORONARY ANGIOGRAPHY N/A 04/07/2017   Procedure: Left Heart Cath and Coronary Angiography;  Surgeon: Nelva Bush, MD;  Location: [place] [facility];  Service: Cardiovascular;  Laterality: N/A;  . PARATHYROIDECTOMY     Rt Superior open neck exploration  . POLYPECTOMY    . RIGHT OOPHORECTOMY    . TONSILLECTOMY      There were no vitals filed for this visit.      Subjective Assessment - 07/10/17 0950    Subjective I am a lot better but still have a ways to go.   Pertinent History osteoporosis. T11 compression fracture s/p fall 02/06/17   Limitations Standing;Sitting;Walking   Patient Stated Goals stand to cook dinner   Currently in Pain? Yes   Pain Score 1    Pain Location Back   Pain Orientation Right;Mid;Lower   Pain Type Chronic pain   Pain Onset More than a month ago   Aggravating Factors  reaching up, leaning back, leaning forward   Pain Relieving Factors lying down, ice, rest   Effect of Pain on Daily Activities cooking and walking   Multiple Pain Sites No                         [facility] Adult PT Treatment/Exercise - 07/10/17 0001      Neuro Re-ed  Neuro Re-ed Details  edu on squeezing sholder and pressing head into mat for greater extension     Knee/Hip Exercises: Aerobic   Nustep L2x52mn  with ice     Shoulder Exercises: Supine   Other Supine Exercises scap series 10x each way bilateral ER green band, diagonals yellow band, abduction yellow   Other Supine Exercises 15 reps UE flex with large ball - abdominal bracing     Manual Therapy   Manual Therapy Soft tissue mobilization   Manual therapy comments left side lying   Soft tissue mobilization thoracic paraspinals and lumbar paraspinals on right side T8-Sacrum                PT Education - 07/10/17 1018    Education provided Yes   Education Details given massage therapy recommendation   Person(s) Educated Patient    Methods Explanation;Handout   Comprehension Verbalized understanding          PT Short Term Goals - 07/03/17 2251      PT SHORT TERM GOAL #1   Title be independent in initial HEP   Status Achieved     PT SHORT TERM GOAL #2   Title verbalize and demonstrate postural modifications and body mechanics modifications needed for spinal protection to prevent future fracture   Status Achieved     PT SHORT TERM GOAL #3   Title reduce pain to sit/stand/walk for > or = to 15 minutes without limitation   Status Achieved     PT SHORT TERM GOAL #4   Title perform 5x sit to stand in < or = to 16 seconds to reduce risk of falls   Status Achieved           PT Long Term Goals - 07/10/17 0945      PT LONG TERM GOAL #1   Title independent with HEP and how to progress herself   Time 6   Period Weeks   Status On-going     PT LONG TERM GOAL #2   Title reduce FOTO to < or = to 39% limitation   Time 8   Period Weeks   Status On-going     PT LONG TERM GOAL #3   Title return to walking for exercise 20-30 minutes without limitation due to pain with pt need to be able to go up a hill   Baseline I live on a hill and that aggravates my pain because I have to lean forwards   Time 6   Period Weeks   Status On-going     PT LONG TERM GOAL #4   Title reduce pain to stand/walk for > or = to 45 minutes without limitation   Baseline haven't tried it   Time 6   Period Weeks   Status On-going     PT LONG TERM GOAL #5   Title demonstrate > or = to 4+/5 bil UE strength to improve endurance for cooking tasks   Baseline flexion Rt 4/5; Lt5/5, abduction 5/5, IR 5/5, ER 5/5, I can cook now for 30 minutes, but usually was able to cook for an hour   Time 6   Period Weeks   Status On-going     PT LONG TERM GOAL #6   Title perform 5x sit to stand in < or = to 13 seconds to reduce risk of falls   Baseline 11 seconds   Time 8   Period Weeks   Status Achieved  Plan - Jul 31, 2017  1408    Clinical Impression Statement Patient has made progress and has met long term goal with improved abilty to stand.  Pt demonstrates greater UE strength.  She continues to be unable to stand as long as she would like in order to cook and do all her typical house hold chores.  Since patient is continuing to show progress with strength and endurance but has not yet met all long term goals, PT will be beneficial in order to achieve maximum function.   PT Treatment/Interventions ADLs/Self Care Home Management;Cryotherapy;Electrical Stimulation;Functional mobility training;Stair training;Gait training;Ultrasound;Moist Heat;Therapeutic activities;Therapeutic exercise;Balance training;Neuromuscular re-education;Patient/family education;Manual techniques;Taping   PT Next Visit Plan postural strength, upper extremity strength, balance exercises, pain management;  KX modifiers;  add to HEP   Consulted and Agree with Plan of Care Patient      Patient will benefit from skilled therapeutic intervention in order to improve the following deficits and impairments:  Postural dysfunction, Decreased strength, Impaired flexibility, Improper body mechanics, Pain, Decreased activity tolerance, Decreased endurance, Increased muscle spasms, Impaired UE functional use, Decreased coordination  Visit Diagnosis: Pain in thoracic spine  Abnormal posture  Muscle weakness (generalized)       G-Codes - 2017-07-31 1407    Functional Assessment Tool Used (Outpatient Only) FOTO: 57% limitation   Functional Limitation Other PT primary   Other PT Primary Current Status (R1021) At least 40 percent but less than 60 percent impaired, limited or restricted   Other PT Primary Goal Status (R1735) At least 20 percent but less than 40 percent impaired, limited or restricted      Problem List Patient Active Problem List   Diagnosis Date Noted  . Coronary artery disease involving native coronary artery of native heart without  angina pectoris 04/28/2017  . Syncope 04/07/2017  . Shortness of breath 04/07/2017  . Abnormal stress test 04/07/2017  . Sinus bradycardia 02/21/2017  . Coronary artery calcification 02/21/2017  . Compression fracture of thoracic vertebra ([place]) 02/08/2017  . Dizziness and giddiness 02/08/2017  . Atrial fibrillation with rapid ventricular response ([place]) 01/21/2016  . Colon polyp s/p appy/partialcecetomy 01/19/3016 01/19/2016  . Orthostatic dizziness 04/25/2014  . Vertigo 12/26/2012  . Hypertension, uncontrolled 09/11/2012  . Diastolic dysfunction 67/11/4101  . Sleep disturbance 09/17/2011  . Hypotension 12/26/2010  . Orthostatic hypotension 12/26/2010  . UTI'S, RECURRENT 11/20/2009  . ADVERSE REACTION TO MEDICATION 07/31/2009  . SCOLIOSIS 05/25/2009  . FATIGUE 05/25/2009  . DIVERTICULOSIS, COLON 02/23/2009  . COLONIC POLYPS, ADENOMATOUS, HX OF 02/23/2009  . PARATHYROIDECTOMY 01/02/2009  . NEPHROLITHIASIS, HX OF 11/15/2008  . PALPITATIONS, OCCASIONAL 07/12/2008  . Essential hypertension 02/04/2008  . Constipation 01/01/2008  . LEG PAIN, LEFT 01/01/2008  . OSTEOPOROSIS 01/01/2008  . HYPERPARATHYROIDISM UNSPECIFIED 10/20/2007  . HYPERLIPIDEMIA 07/07/2007  . ANEMIA-NOS 07/07/2007  . URINARY INCONTINENCE 07/07/2007    Zannie Cove, PT 2017/07/31, 2:11 PM  [facility] [address] [place], [place], [address] Phone: [phone]   Fax:  [phone]  Name: [name] MRN: [id number] Date of Birth: 01/13/1941

## 2017-07-11 DIAGNOSIS — Z1231 Encounter for screening mammogram for malignant neoplasm of breast: Secondary | ICD-10-CM | POA: Diagnosis not present

## 2017-07-14 ENCOUNTER — Encounter: Payer: Self-pay | Admitting: Rehabilitation

## 2017-07-14 ENCOUNTER — Ambulatory Visit: Payer: Medicare Other | Admitting: Rehabilitation

## 2017-07-14 DIAGNOSIS — R293 Abnormal posture: Secondary | ICD-10-CM | POA: Diagnosis not present

## 2017-07-14 DIAGNOSIS — R279 Unspecified lack of coordination: Secondary | ICD-10-CM | POA: Diagnosis not present

## 2017-07-14 DIAGNOSIS — N8189 Other female genital prolapse: Secondary | ICD-10-CM | POA: Diagnosis not present

## 2017-07-14 DIAGNOSIS — M546 Pain in thoracic spine: Secondary | ICD-10-CM | POA: Diagnosis not present

## 2017-07-14 DIAGNOSIS — M6281 Muscle weakness (generalized): Secondary | ICD-10-CM | POA: Diagnosis not present

## 2017-07-14 DIAGNOSIS — R296 Repeated falls: Secondary | ICD-10-CM | POA: Diagnosis not present

## 2017-07-14 NOTE — Therapy (Signed)
[facility] [address] [place], [place], [address] Phone: [phone]   Fax:  [phone]  Physical Therapy Treatment  Patient Details  Name: [name] MRN: [id number] Date of Birth: [birth year] Referring Provider: Wandra Feinstein, MD  Encounter Date: 07/14/2017      PT End of Session - 07/14/17 1003    Visit Number 21   Number of Visits 30   Date for PT Re-Evaluation 07-30-2017   Authorization Type G-codes at 20, KX modifier needed   PT Start Time 0933   PT Stop Time 1016   PT Time Calculation (min) 43 min   Activity Tolerance Patient tolerated treatment well      Past Medical History:  Diagnosis Date  . Abdominal pain, unspecified site   . ADVERSE REACTION TO MEDICATION 07/31/2009  . ANEMIA-NOS 07/07/2007  . Arthritis   . Chest pain, unspecified   . Chronic kidney disease    hx of kidney stone  . COLONIC POLYPS, ADENOMATOUS, HX OF   . Disturbance of skin sensation   . DIVERTICULOSIS, COLON 02/23/2009  . Family history of diabetes mellitus   . Family history of malignant neoplasm of breast   . FATIGUE 05/25/2009  . Fever, unspecified   . Heart murmur    hx of  . HYPERLIPIDEMIA 07/07/2007  . HYPERPARATHYROIDISM UNSPECIFIED 10/20/2007  . HYPERTENSION 02/04/2008  . NEPHROLITHIASIS, HX OF 11/15/2008  . OSTEOPOROSIS 01/01/2008  . Other acquired absence of organ    parathyroidectomy  . Pain in limb   . PALPITATIONS, OCCASIONAL 07/12/2008  . PARATHYROIDECTOMY 01/02/2009  . SCOLIOSIS 05/25/2009  . Seasonal allergies   . Unspecified constipation   . URINARY INCONTINENCE 07/07/2007  . UTI'S, RECURRENT 11/20/2009   kimbrough     Past Surgical History:  Procedure Laterality Date  . BUNIONECTOMY    . COLONOSCOPY    . LAPAROSCOPIC APPENDECTOMY  01/19/2016   Procedure: APPENDECTOMY LAPAROSCOPIC with orifice of cecal polyp;  Surgeon: Leighton Ruff, MD;  Location: [facility];  Service: General;;  . LEFT HEART CATH AND  CORONARY ANGIOGRAPHY N/A 04/07/2017   Procedure: Left Heart Cath and Coronary Angiography;  Surgeon: Nelva Bush, MD;  Location: [facility];  Service: Cardiovascular;  Laterality: N/A;  . PARATHYROIDECTOMY     Rt Superior open neck exploration  . POLYPECTOMY    . RIGHT OOPHORECTOMY    . TONSILLECTOMY      There were no vitals filed for this visit.      Subjective Assessment - 07/14/17 0944    Subjective Going to the MD Wednesday.  Should be having a new xray taken.  The STM to the R QL felt good last time.     Currently in Pain? Yes   Pain Score 3    Pain Location Back   Pain Orientation Right;Mid;Lower                         [facility] Adult PT Treatment/Exercise - 07/14/17 0001      Knee/Hip Exercises: Aerobic   Nustep L3x36min   Other Aerobic attempted UBE but with pt feeling too "compressed" in the R QL after STM     Shoulder Exercises: Supine   Other Supine Exercises red band diagnols x 15 each   Other Supine Exercises red ball presses isometric in hooklying 6"x10 and red ball     Shoulder Exercises: Standing   Extension Both;15 reps;Theraband   Theraband Level (Shoulder Extension)  Level 2 (Red)   Row Strengthening;15 reps;Theraband   Theraband Level (Shoulder Row) Level 2 (Red)     Manual Therapy   Manual Therapy Soft tissue mobilization   Manual therapy comments left side lying   Soft tissue mobilization thoracic paraspinals and lumbar paraspinals on right side T8-Sacrum                  PT Short Term Goals - 07/03/17 2251      PT SHORT TERM GOAL #1   Title be independent in initial HEP   Status Achieved     PT SHORT TERM GOAL #2   Title verbalize and demonstrate postural modifications and body mechanics modifications needed for spinal protection to prevent future fracture   Status Achieved     PT SHORT TERM GOAL #3   Title reduce pain to sit/stand/walk for > or = to 15 minutes without limitation   Status Achieved      PT SHORT TERM GOAL #4   Title perform 5x sit to stand in < or = to 16 seconds to reduce risk of falls   Status Achieved           PT Long Term Goals - 07/10/17 0945      PT LONG TERM GOAL #1   Title independent with HEP and how to progress herself   Time 6   Period Weeks   Status On-going     PT LONG TERM GOAL #2   Title reduce FOTO to < or = to 39% limitation   Time 8   Period Weeks   Status On-going     PT LONG TERM GOAL #3   Title return to walking for exercise 20-30 minutes without limitation due to pain with pt need to be able to go up a hill   Baseline I live on a hill and that aggravates my pain because I have to lean forwards   Time 6   Period Weeks   Status On-going     PT LONG TERM GOAL #4   Title reduce pain to stand/walk for > or = to 45 minutes without limitation   Baseline haven't tried it   Time 6   Period Weeks   Status On-going     PT LONG TERM GOAL #5   Title demonstrate > or = to 4+/5 bil UE strength to improve endurance for cooking tasks   Baseline flexion Rt 4/5; Lt5/5, abduction 5/5, IR 5/5, ER 5/5, I can cook now for 30 minutes, but usually was able to cook for an hour   Time 6   Period Weeks   Status On-going     PT LONG TERM GOAL #6   Title perform 5x sit to stand in < or = to 13 seconds to reduce risk of falls   Baseline 11 seconds   Time 8   Period Weeks   Status Achieved               Plan - 07/14/17 1014    Clinical Impression Statement Patient has made alot of progress overall but continues to have pain with prolonged overhead activities and lifting for things like cooking and chores.  She will benefit from continued therapy at this point to maximize strength and UE functioning.  She also has concerns about the R quadratus lumborum pain that she would like to discuss with the MD but educated her about most likely being muscular due to scoliosis   PT Frequency 2x /  week   PT Duration 6 weeks   PT Treatment/Interventions  ADLs/Self Care Home Management;Cryotherapy;Electrical Stimulation;Functional mobility training;Stair training;Gait training;Ultrasound;Moist Heat;Therapeutic activities;Therapeutic exercise;Balance training;Neuromuscular re-education;Patient/family education;Manual techniques;Taping   PT Next Visit Plan postural strength, upper extremity strength, balance exercises, pain management;  KX modifiers;  add to HEP   Consulted and Agree with Plan of Care Patient      Patient will benefit from skilled therapeutic intervention in order to improve the following deficits and impairments:  Postural dysfunction, Decreased strength, Impaired flexibility, Improper body mechanics, Pain, Decreased activity tolerance, Decreased endurance, Increased muscle spasms, Impaired UE functional use, Decreased coordination  Visit Diagnosis: Pain in thoracic spine  Abnormal posture  Muscle weakness (generalized)     Problem List Patient Active Problem List   Diagnosis Date Noted  . Coronary artery disease involving native coronary artery of native heart without angina pectoris 04/28/2017  . Syncope 04/07/2017  . Shortness of breath 04/07/2017  . Abnormal stress test 04/07/2017  . Sinus bradycardia 02/21/2017  . Coronary artery calcification 02/21/2017  . Compression fracture of thoracic vertebra ([place]) 02/08/2017  . Dizziness and giddiness 02/08/2017  . Atrial fibrillation with rapid ventricular response ([place]) 01/21/2016  . Colon polyp s/p appy/partialcecetomy 01/19/3016 01/19/2016  . Orthostatic dizziness 04/25/2014  . Vertigo 12/26/2012  . Hypertension, uncontrolled 09/11/2012  . Diastolic dysfunction 32/20/2542  . Sleep disturbance 09/17/2011  . Hypotension 12/26/2010  . Orthostatic hypotension 12/26/2010  . UTI'S, RECURRENT 11/20/2009  . ADVERSE REACTION TO MEDICATION 07/31/2009  . SCOLIOSIS 05/25/2009  . FATIGUE 05/25/2009  . DIVERTICULOSIS, COLON 02/23/2009  . COLONIC POLYPS, ADENOMATOUS, HX OF  02/23/2009  . PARATHYROIDECTOMY 01/02/2009  . NEPHROLITHIASIS, HX OF 11/15/2008  . PALPITATIONS, OCCASIONAL 07/12/2008  . Essential hypertension 02/04/2008  . Constipation 01/01/2008  . LEG PAIN, LEFT 01/01/2008  . OSTEOPOROSIS 01/01/2008  . HYPERPARATHYROIDISM UNSPECIFIED 10/20/2007  . HYPERLIPIDEMIA 07/07/2007  . ANEMIA-NOS 07/07/2007  . URINARY INCONTINENCE 07/07/2007    Stark Bray, DPT, CMP 07/14/2017, 10:26 AM  [facility] [address], [place], [place], [address] Phone: [phone]   Fax:  [phone]  Name: [name] MRN: [id number] Date of Birth: 01/20/1941

## 2017-07-16 DIAGNOSIS — M810 Age-related osteoporosis without current pathological fracture: Secondary | ICD-10-CM | POA: Diagnosis not present

## 2017-07-16 DIAGNOSIS — M545 Low back pain: Secondary | ICD-10-CM | POA: Diagnosis not present

## 2017-07-16 DIAGNOSIS — R0781 Pleurodynia: Secondary | ICD-10-CM | POA: Diagnosis not present

## 2017-07-17 ENCOUNTER — Ambulatory Visit: Payer: Medicare Other | Admitting: Physical Therapy

## 2017-07-17 DIAGNOSIS — R293 Abnormal posture: Secondary | ICD-10-CM

## 2017-07-17 DIAGNOSIS — N8189 Other female genital prolapse: Secondary | ICD-10-CM | POA: Diagnosis not present

## 2017-07-17 DIAGNOSIS — R296 Repeated falls: Secondary | ICD-10-CM

## 2017-07-17 DIAGNOSIS — M6281 Muscle weakness (generalized): Secondary | ICD-10-CM

## 2017-07-17 DIAGNOSIS — M546 Pain in thoracic spine: Secondary | ICD-10-CM

## 2017-07-17 DIAGNOSIS — R279 Unspecified lack of coordination: Secondary | ICD-10-CM | POA: Diagnosis not present

## 2017-07-17 NOTE — Therapy (Signed)
[facility] [address] [place], [place], [address] Phone: [phone]   Fax:  [phone]  Physical Therapy Treatment  Patient Details  Name: [name] MRN: [id number] Date of Birth: 1941/07/10 Referring Provider: Wandra Feinstein, MD  Encounter Date: 07/17/2017      PT End of Session - 07/17/17 1347    Visit Number 22   Number of Visits 30   Date for PT Re-Evaluation 2017/08/07   Authorization Type G-codes at 20, KX modifier needed   PT Start Time 0930   PT Stop Time 1015   PT Time Calculation (min) 45 min   Activity Tolerance Patient tolerated treatment well      Past Medical History:  Diagnosis Date  . Abdominal pain, unspecified site   . ADVERSE REACTION TO MEDICATION 07/31/2009  . ANEMIA-NOS 07/07/2007  . Arthritis   . Chest pain, unspecified   . Chronic kidney disease    hx of kidney stone  . COLONIC POLYPS, ADENOMATOUS, HX OF   . Disturbance of skin sensation   . DIVERTICULOSIS, COLON 02/23/2009  . Family history of diabetes mellitus   . Family history of malignant neoplasm of breast   . FATIGUE 05/25/2009  . Fever, unspecified   . Heart murmur    hx of  . HYPERLIPIDEMIA 07/07/2007  . HYPERPARATHYROIDISM UNSPECIFIED 10/20/2007  . HYPERTENSION 02/04/2008  . NEPHROLITHIASIS, HX OF 11/15/2008  . OSTEOPOROSIS 01/01/2008  . Other acquired absence of organ    parathyroidectomy  . Pain in limb   . PALPITATIONS, OCCASIONAL 07/12/2008  . PARATHYROIDECTOMY 01/02/2009  . SCOLIOSIS 05/25/2009  . Seasonal allergies   . Unspecified constipation   . URINARY INCONTINENCE 07/07/2007  . UTI'S, RECURRENT 11/20/2009   kimbrough     Past Surgical History:  Procedure Laterality Date  . BUNIONECTOMY    . COLONOSCOPY    . LAPAROSCOPIC APPENDECTOMY  01/19/2016   Procedure: APPENDECTOMY LAPAROSCOPIC with orifice of cecal polyp;  Surgeon: Leighton Ruff, MD;  Location: [facility];  Service: General;;  . LEFT HEART CATH AND  CORONARY ANGIOGRAPHY N/A 04/07/2017   Procedure: Left Heart Cath and Coronary Angiography;  Surgeon: Nelva Bush, MD;  Location: [facility];  Service: Cardiovascular;  Laterality: N/A;  . PARATHYROIDECTOMY     Rt Superior open neck exploration  . POLYPECTOMY    . RIGHT OOPHORECTOMY    . TONSILLECTOMY      There were no vitals filed for this visit.      Subjective Assessment - 07/17/17 0928    Subjective The doctor said that pain on my right side was from the porous bones in my ribs when I take the trash out.  I like taking the ball overhead b/c it stretches that side.     Pertinent History osteoporosis. T11 compression fracture s/p fall 02/06/17   Currently in Pain? No/denies   Pain Score 0-No pain   Aggravating Factors  taking trash out    Pain Relieving Factors lying down;                         [facility] Adult PT Treatment/Exercise - 07/17/17 0001      Therapeutic Activites    Other Therapeutic Activities reaching, standing, walking erect     Neuro Re-ed    Neuro Re-ed Details  lengthening QL to create space between lower ribs and pelvis on right     Knee/Hip Exercises: Stretches   Other Knee/Hip  Stretches psoas doorway stretch 3x 5     Knee/Hip Exercises: Aerobic   Nustep L3x66min     Knee/Hip Exercises: Sidelying   Other Sidelying Knee/Hip Exercises UE abduction 10x right     Shoulder Exercises: Supine   Other Supine Exercises decompression series 5x:  arm press, head press, shoulder press, leg lengthener, whole leg press 5x each 5 sec holds     Shoulder Exercises: Seated   Other Seated Exercises right UE elevation with focus on lengthening space between ribs and pelvis     Shoulder Exercises: Standing   Other Standing Exercises UE Ranger 2 hands with stepping forward 5x 2   Other Standing Exercises attempted [place] wall exercises but patient complained of pain with back against wall so discontinued                PT Education  - 07/17/17 1346    Education provided Yes   Education Details lengthening right flank to decompress   Person(s) Educated Patient   Methods Explanation;Demonstration;Verbal cues;Tactile cues  pt declined handout   Comprehension Verbalized understanding          PT Short Term Goals - 07/17/17 1358      PT SHORT TERM GOAL #1   Title be independent in initial HEP   Status Achieved     PT SHORT TERM GOAL #2   Title verbalize and demonstrate postural modifications and body mechanics modifications needed for spinal protection to prevent future fracture   Status Achieved     PT SHORT TERM GOAL #3   Title reduce pain to sit/stand/walk for > or = to 15 minutes without limitation   Status Achieved     PT SHORT TERM GOAL #4   Title perform 5x sit to stand in < or = to 16 seconds to reduce risk of falls   Status Achieved           PT Long Term Goals - 07/17/17 1359      PT LONG TERM GOAL #1   Title independent with HEP and how to progress herself   Time 6   Period Weeks   Status On-going     PT LONG TERM GOAL #2   Title reduce FOTO to < or = to 39% limitation   Time 8   Period Weeks   Status On-going     PT LONG TERM GOAL #3   Title return to walking for exercise 20-30 minutes without limitation due to pain with pt need to be able to go up a hill   Time 6   Period Weeks   Status On-going     PT LONG TERM GOAL #4   Title reduce pain to stand/walk for > or = to 45 minutes without limitation   Time 6   Period Weeks   Status On-going     PT LONG TERM GOAL #5   Title demonstrate > or = to 4+/5 bil UE strength to improve endurance for cooking tasks   Time 6   Period Weeks   Status On-going     PT LONG TERM GOAL #6   Title perform 5x sit to stand in < or = to 13 seconds to reduce risk of falls   Status Achieved               Plan - 07/17/17 1348    Clinical Impression Statement The patient reports feeling discouraged about what the doctor said about her  porous bones contributing to  her scoliosis and her ribs collapsing down on her pelvis.  Treatment focus on exercises to elongate and decompress her right side and creating more space between her ribs and pelvis.  Will continue to provide patient education and progressive HEP to prepare for independence with self management after discharge from therapy.     Rehab Potential Good   PT Frequency 2x / week   PT Duration 6 weeks   PT Treatment/Interventions ADLs/Self Care Home Management;Cryotherapy;Electrical Stimulation;Functional mobility training;Stair training;Gait training;Ultrasound;Moist Heat;Therapeutic activities;Therapeutic exercise;Balance training;Neuromuscular re-education;Patient/family education;Manual techniques;Taping   PT Next Visit Plan postural strength, upper extremity strength, balance exercises, pain management;  KX modifiers;  spinal elongation and decompression especially right side      Patient will benefit from skilled therapeutic intervention in order to improve the following deficits and impairments:  Postural dysfunction, Decreased strength, Impaired flexibility, Improper body mechanics, Pain, Decreased activity tolerance, Decreased endurance, Increased muscle spasms, Impaired UE functional use, Decreased coordination  Visit Diagnosis: Pain in thoracic spine  Abnormal posture  Muscle weakness (generalized)  Repeated falls     Problem List Patient Active Problem List   Diagnosis Date Noted  . Coronary artery disease involving native coronary artery of native heart without angina pectoris 04/28/2017  . Syncope 04/07/2017  . Shortness of breath 04/07/2017  . Abnormal stress test 04/07/2017  . Sinus bradycardia 02/21/2017  . Coronary artery calcification 02/21/2017  . Compression fracture of thoracic vertebra ([place]) 02/08/2017  . Dizziness and giddiness 02/08/2017  . Atrial fibrillation with rapid ventricular response ([place]) 01/21/2016  . Colon polyp s/p  appy/partialcecetomy 01/19/3016 01/19/2016  . Orthostatic dizziness 04/25/2014  . Vertigo 12/26/2012  . Hypertension, uncontrolled 09/11/2012  . Diastolic dysfunction 14/97/0263  . Sleep disturbance 09/17/2011  . Hypotension 12/26/2010  . Orthostatic hypotension 12/26/2010  . UTI'S, RECURRENT 11/20/2009  . ADVERSE REACTION TO MEDICATION 07/31/2009  . SCOLIOSIS 05/25/2009  . FATIGUE 05/25/2009  . DIVERTICULOSIS, COLON 02/23/2009  . COLONIC POLYPS, ADENOMATOUS, HX OF 02/23/2009  . PARATHYROIDECTOMY 01/02/2009  . NEPHROLITHIASIS, HX OF 11/15/2008  . PALPITATIONS, OCCASIONAL 07/12/2008  . Essential hypertension 02/04/2008  . Constipation 01/01/2008  . LEG PAIN, LEFT 01/01/2008  . OSTEOPOROSIS 01/01/2008  . HYPERPARATHYROIDISM UNSPECIFIED 10/20/2007  . HYPERLIPIDEMIA 07/07/2007  . ANEMIA-NOS 07/07/2007  . URINARY INCONTINENCE 07/07/2007   Ruben Im, PT 07/17/17 2:01 PM Phone: [phone] Fax: [phone]  Alvera Singh 07/17/2017, 2:00 PM  [facility] [address] [place], [place], [address] Phone: [phone]   Fax:  [phone]  Name: [name] MRN: [id number] Date of Birth: 1941/08/02

## 2017-07-22 ENCOUNTER — Encounter: Payer: Medicare Other | Admitting: Physical Therapy

## 2017-07-23 ENCOUNTER — Other Ambulatory Visit: Payer: Self-pay | Admitting: Sports Medicine

## 2017-07-23 DIAGNOSIS — M545 Low back pain: Secondary | ICD-10-CM | POA: Diagnosis not present

## 2017-07-23 DIAGNOSIS — S22080D Wedge compression fracture of T11-T12 vertebra, subsequent encounter for fracture with routine healing: Secondary | ICD-10-CM | POA: Diagnosis not present

## 2017-07-23 DIAGNOSIS — M810 Age-related osteoporosis without current pathological fracture: Secondary | ICD-10-CM

## 2017-07-24 ENCOUNTER — Encounter: Payer: Self-pay | Admitting: Physical Therapy

## 2017-07-24 ENCOUNTER — Ambulatory Visit: Payer: Medicare Other | Attending: Sports Medicine | Admitting: Physical Therapy

## 2017-07-24 DIAGNOSIS — M546 Pain in thoracic spine: Secondary | ICD-10-CM | POA: Insufficient documentation

## 2017-07-24 DIAGNOSIS — R296 Repeated falls: Secondary | ICD-10-CM | POA: Diagnosis not present

## 2017-07-24 DIAGNOSIS — R293 Abnormal posture: Secondary | ICD-10-CM | POA: Insufficient documentation

## 2017-07-24 DIAGNOSIS — M6281 Muscle weakness (generalized): Secondary | ICD-10-CM | POA: Insufficient documentation

## 2017-07-24 NOTE — Therapy (Addendum)
[facility] [address], [place], [place], [address] Phone: [phone]   Fax:  [phone]  Physical Therapy Treatment  Patient Details  Name: [name] MRN: [id number] Date of Birth: October 04, 1941 Referring Provider: Wandra Feinstein, MD  Encounter Date: 07/24/2017      PT End of Session - 07/24/17 1007    Visit Number 23   Number of Visits 30   Date for PT Re-Evaluation 2017-08-08   Authorization Type G-codes at 24, KX modifier needed   PT Start Time 6283   PT Stop Time 1005  patient was in pain   PT Time Calculation (min) 30 min   Activity Tolerance Patient limited by pain   Behavior During Therapy [facility] for tasks assessed/performed      Past Medical History:  Diagnosis Date  . Abdominal pain, unspecified site   . ADVERSE REACTION TO MEDICATION 07/31/2009  . ANEMIA-NOS 07/07/2007  . Arthritis   . Chest pain, unspecified   . Chronic kidney disease    hx of kidney stone  . COLONIC POLYPS, ADENOMATOUS, HX OF   . Disturbance of skin sensation   . DIVERTICULOSIS, COLON 02/23/2009  . Family history of diabetes mellitus   . Family history of malignant neoplasm of breast   . FATIGUE 05/25/2009  . Fever, unspecified   . Heart murmur    hx of  . HYPERLIPIDEMIA 07/07/2007  . HYPERPARATHYROIDISM UNSPECIFIED 10/20/2007  . HYPERTENSION 02/04/2008  . NEPHROLITHIASIS, HX OF 11/15/2008  . OSTEOPOROSIS 01/01/2008  . Other acquired absence of organ    parathyroidectomy  . Pain in limb   . PALPITATIONS, OCCASIONAL 07/12/2008  . PARATHYROIDECTOMY 01/02/2009  . SCOLIOSIS 05/25/2009  . Seasonal allergies   . Unspecified constipation   . URINARY INCONTINENCE 07/07/2007  . UTI'S, RECURRENT 11/20/2009   kimbrough     Past Surgical History:  Procedure Laterality Date  . BUNIONECTOMY    . COLONOSCOPY    . LAPAROSCOPIC APPENDECTOMY  01/19/2016   Procedure: APPENDECTOMY LAPAROSCOPIC with orifice of cecal polyp;  Surgeon: Leighton Ruff, MD;  Location: [facility];  Service: General;;  . LEFT HEART CATH AND CORONARY ANGIOGRAPHY N/A 04/07/2017   Procedure: Left Heart Cath and Coronary Angiography;  Surgeon: Nelva Bush, MD;  Location: [facility];  Service: Cardiovascular;  Laterality: N/A;  . PARATHYROIDECTOMY     [place] open neck exploration  . POLYPECTOMY    . RIGHT OOPHORECTOMY    . TONSILLECTOMY      There were no vitals filed for this visit.      Subjective Assessment - 07/24/17 0938    Subjective Sunday morning I could not get out of bed.  I am scheduled for a MRI. I went for a back brace and there is not a small enough one. Sunday morning was the first time I woke up with pain.     Pertinent History osteoporosis. T11 compression fracture s/p fall 02/06/17   Limitations Standing;Sitting;Walking   How long can you sit comfortably? 2 hours   How long can you stand comfortably? 30 minutes   Diagnostic tests x-ray to monitor healing   Patient Stated Goals stand to cook dinner   Currently in Pain? Yes   Pain Score 7    Pain Location Back   Pain Orientation Right;Mid;Lower   Pain Descriptors / Indicators Aching   Pain Type Chronic pain   Pain Onset More than a month ago   Pain Frequency Constant  Aggravating Factors  movement,sitting   Pain Relieving Factors lying down   Effect of Pain on Daily Activities cooking and walking                         [facility] Adult PT Treatment/Exercise - 07/24/17 0001      Self-Care   Self-Care Other Self-Care Comments   Other Self-Care Comments  ways to manage back pain; where to get a customized brace for her back     Therapeutic Activites    Therapeutic Activities ADL's   ADL's laying in bed; daily activities including eating bending at hips, no lifting, walking, limiting sitting                 PT Education - 07/24/17 0950    Education provided Yes   Education Details lengthening the right flank to decompress the right  lumbar area; use ice, lay down with legs elevated with pillow underneath your knees. Every hour walk, body mechanics   Person(s) Educated Patient   Methods Explanation;Demonstration;Handout   Comprehension Verbalized understanding          PT Short Term Goals - 07/17/17 1358      PT SHORT TERM GOAL #1   Title be independent in initial HEP   Status Achieved     PT SHORT TERM GOAL #2   Title verbalize and demonstrate postural modifications and body mechanics modifications needed for spinal protection to prevent future fracture   Status Achieved     PT SHORT TERM GOAL #3   Title reduce pain to sit/stand/walk for > or = to 15 minutes without limitation   Status Achieved     PT SHORT TERM GOAL #4   Title perform 5x sit to stand in < or = to 16 seconds to reduce risk of falls   Status Achieved           PT Long Term Goals - 07/24/17 1011      PT LONG TERM GOAL #1   Title independent with HEP and how to progress herself   Time 6   Period Weeks   Status On-going     PT LONG TERM GOAL #2   Title reduce FOTO to < or = to 39% limitation   Time 8   Period Weeks   Status On-going     PT LONG TERM GOAL #3   Title return to walking for exercise 20-30 minutes without limitation due to pain with pt need to be able to go up a hill   Time 6   Period Weeks   Status On-going     PT LONG TERM GOAL #4   Title reduce pain to stand/walk for > or = to 45 minutes without limitation   Time 6   Period Weeks   Status On-going     PT LONG TERM GOAL #5   Title demonstrate > or = to 4+/5 bil UE strength to improve endurance for cooking tasks   Period Weeks   Status On-going     PT LONG TERM GOAL #6   Title perform 5x sit to stand in < or = to 13 seconds to reduce risk of falls   Time 8   Period Weeks   Status Achieved               Plan - 07/24/17 1008    Clinical Impression Statement Patient had a recent flare-up last sunday.  She is scheduled for  a MRI on 08/30/2017 to  determine next course of plan for her back.  Patient was not discharged yet until the doctor makes the next plan.  Patient was using a walker beginning of the week and now not but needs a cane.  Patient now has increase in right lumbar pain with sitting which is new.  Patient will have MRI then meet with the doctor and let us know if she is to continue with therapy.    Rehab Potential Good   PT Frequency 2x / week   PT Duration 6 weeks   PT Treatment/Interventions ADLs/Self Care Home Management;Cryotherapy;Electrical Stimulation;Functional mobility training;Stair training;Gait training;Ultrasound;Moist Heat;Therapeutic activities;Therapeutic exercise;Balance training;Neuromuscular re-education;Patient/family education;Manual techniques;Taping   PT Next Visit Plan see what MD says then reassess pateint if she is to continue   PT Home Exercise Plan progress as needed   Consulted and Agree with Plan of Care Patient      Patient will benefit from skilled therapeutic intervention in order to improve the following deficits and impairments:  Postural dysfunction, Decreased strength, Impaired flexibility, Improper body mechanics, Pain, Decreased activity tolerance, Decreased endurance, Increased muscle spasms, Impaired UE functional use, Decreased coordination  Visit Diagnosis: Pain in thoracic spine  Abnormal posture  Muscle weakness (generalized)  Repeated falls     Problem List Patient Active Problem List   Diagnosis Date Noted  . Coronary artery disease involving native coronary artery of native heart without angina pectoris 04/28/2017  . Syncope 04/07/2017  . Shortness of breath 04/07/2017  . Abnormal stress test 04/07/2017  . Sinus bradycardia 02/21/2017  . Coronary artery calcification 02/21/2017  . Compression fracture of thoracic vertebra ([place]) 02/08/2017  . Dizziness and giddiness 02/08/2017  . Atrial fibrillation with rapid ventricular response ([place]) 01/21/2016  . Colon polyp  s/p appy/partialcecetomy 01/19/3016 01/19/2016  . Orthostatic dizziness 04/25/2014  . Vertigo 12/26/2012  . Hypertension, uncontrolled 09/11/2012  . Diastolic dysfunction 51/70/0174  . Sleep disturbance 09/17/2011  . Hypotension 12/26/2010  . Orthostatic hypotension 12/26/2010  . UTI'S, RECURRENT 11/20/2009  . ADVERSE REACTION TO MEDICATION 07/31/2009  . SCOLIOSIS 05/25/2009  . FATIGUE 05/25/2009  . DIVERTICULOSIS, COLON 02/23/2009  . COLONIC POLYPS, ADENOMATOUS, HX OF 02/23/2009  . PARATHYROIDECTOMY 01/02/2009  . NEPHROLITHIASIS, HX OF 11/15/2008  . PALPITATIONS, OCCASIONAL 07/12/2008  . Essential hypertension 02/04/2008  . Constipation 01/01/2008  . LEG PAIN, LEFT 01/01/2008  . OSTEOPOROSIS 01/01/2008  . HYPERPARATHYROIDISM UNSPECIFIED 10/20/2007  . HYPERLIPIDEMIA 07/07/2007  . ANEMIA-NOS 07/07/2007  . URINARY INCONTINENCE 07/07/2007    Earlie Counts, PT 08-20-2017 10:13 AM  G-codes: Other PT primary CJ: goal status CK: D/C status PHYSICAL THERAPY DISCHARGE SUMMARY  Visits from Start of Care: 23  Current functional level related to goals / functional outcomes: See above for current status.  Pt was going to MD for further testing.     Remaining deficits: See above   Education / Equipment: Body mechanics, HEP Plan: Patient agrees to discharge.  Patient goals were partially met. Patient is being discharged due to a change in medical status.  ?????        Sigurd Sos, PT 08/13/17 1:06 PM   [facility] [address] [place], [place], [address] Phone: [phone]   Fax:  [phone]  Name: [name] MRN: [id number] Date of Birth: 09/02/1941

## 2017-07-24 NOTE — Patient Instructions (Addendum)
Positioning on Back in Bed    Place pillow under knees. Use towel roll or pillow under neck and/or head if needed. Any pillow under head and/or neck should be as small as possible but enough to protect alignment of this area.  Copyright  VHI. All rights reserved.  Getting Into and Out of Bed    Sit on edge of bed, feet on floor. Lie down sideways. Roll over onto back. Keep back straight. Use hand and elbow to lower and raise trunk. Move shoulders and hips at same rate to avoid twisting the back. To get out of bed, reverse movement.   Copyright  VHI. All rights reserved.  Balloon Breath    Place hands LIGHTLY on belly below navel. Imagine a balloon inside belly. Blow up balloon on breath IN, deflate balloon on breath OUT. Contract abdominals slightly to assist breath OUT. Time _10_ times. Do in sitting and laying on back.   Copyright  VHI. All rights reserved.  Wing Spread    Pretend collarbones are wings. Spread wings, expanding front of chest. DO NOT PULL SHOULDERS BACK. Practice frequently during the day.  Copyright  VHI. All rights reserved.  Posture - Sitting    Sit upright, head facing forward. Try using a roll to support lower back. Keep shoulders relaxed, and avoid rounded back. Keep hips level with knees. Avoid crossing legs for long periods.   Copyright  VHI. All rights reserved.  Avoid Twisting    Avoid twisting or bending back. Pivot around using foot movements, and bend at knees if needed when reaching for articles.   Copyright  VHI. All rights reserved.  Bending    Bend at hips and knees, not back. Keep feet shoulder-width apart.   Copyright  VHI. All rights reserved.  NO lifting  Rest with laying on back and get up every  30 min to 1 hour and walk around the house  Use ice for pain  Do the decompression exercise  Leg Straightener / Heel Extender    Straighten one leg down. Pull toes AND forefoot toward knee, extend heel. Hold  foot position 3___ seconds. Relax the foot. Repeat 1 time. Re-bend knee. Do other leg. Each leg __3_ times. 3 times per day.   Copyright  VHI. All rights reserved.  Eating    Sit, protecting natural arch in low back. Bring food to mouth, not mouth to food. Do not lean on elbows or arms.  Copyright  VHI. All rights reserved.  [facility] [address] [place], [place] [address] Phone # [phone] Fax [phone]

## 2017-07-29 ENCOUNTER — Ambulatory Visit: Payer: Self-pay

## 2017-07-31 DIAGNOSIS — M545 Low back pain: Secondary | ICD-10-CM | POA: Diagnosis not present

## 2017-08-06 ENCOUNTER — Other Ambulatory Visit: Payer: Medicare Other

## 2017-08-06 DIAGNOSIS — M810 Age-related osteoporosis without current pathological fracture: Secondary | ICD-10-CM | POA: Diagnosis not present

## 2017-08-06 DIAGNOSIS — M545 Low back pain: Secondary | ICD-10-CM | POA: Diagnosis not present

## 2017-08-13 ENCOUNTER — Other Ambulatory Visit: Payer: Medicare Other

## 2017-09-12 DIAGNOSIS — Z23 Encounter for immunization: Secondary | ICD-10-CM | POA: Diagnosis not present

## 2017-09-22 DIAGNOSIS — M4854XA Collapsed vertebra, not elsewhere classified, thoracic region, initial encounter for fracture: Secondary | ICD-10-CM | POA: Diagnosis not present

## 2017-09-22 DIAGNOSIS — Z79899 Other long term (current) drug therapy: Secondary | ICD-10-CM | POA: Diagnosis not present

## 2017-09-22 DIAGNOSIS — M810 Age-related osteoporosis without current pathological fracture: Secondary | ICD-10-CM | POA: Diagnosis not present

## 2017-09-22 DIAGNOSIS — Z6820 Body mass index (BMI) 20.0-20.9, adult: Secondary | ICD-10-CM | POA: Diagnosis not present

## 2017-09-22 DIAGNOSIS — R768 Other specified abnormal immunological findings in serum: Secondary | ICD-10-CM | POA: Diagnosis not present

## 2017-10-20 DIAGNOSIS — M810 Age-related osteoporosis without current pathological fracture: Secondary | ICD-10-CM | POA: Diagnosis not present

## 2017-10-22 DIAGNOSIS — E7800 Pure hypercholesterolemia, unspecified: Secondary | ICD-10-CM | POA: Diagnosis not present

## 2017-10-22 DIAGNOSIS — Z1389 Encounter for screening for other disorder: Secondary | ICD-10-CM | POA: Diagnosis not present

## 2017-10-22 DIAGNOSIS — I951 Orthostatic hypotension: Secondary | ICD-10-CM | POA: Diagnosis not present

## 2017-10-22 DIAGNOSIS — I2510 Atherosclerotic heart disease of native coronary artery without angina pectoris: Secondary | ICD-10-CM | POA: Diagnosis not present

## 2017-10-22 DIAGNOSIS — I10 Essential (primary) hypertension: Secondary | ICD-10-CM | POA: Diagnosis not present

## 2017-10-22 DIAGNOSIS — M810 Age-related osteoporosis without current pathological fracture: Secondary | ICD-10-CM | POA: Diagnosis not present

## 2017-10-22 DIAGNOSIS — Z0000 Encounter for general adult medical examination without abnormal findings: Secondary | ICD-10-CM | POA: Diagnosis not present

## 2017-10-22 DIAGNOSIS — M4854XA Collapsed vertebra, not elsewhere classified, thoracic region, initial encounter for fracture: Secondary | ICD-10-CM | POA: Diagnosis not present

## 2017-10-22 DIAGNOSIS — W19XXXD Unspecified fall, subsequent encounter: Secondary | ICD-10-CM | POA: Diagnosis not present

## 2017-11-08 ENCOUNTER — Other Ambulatory Visit: Payer: Self-pay | Admitting: Cardiology

## 2017-11-10 NOTE — Telephone Encounter (Signed)
REFILL 

## 2017-11-25 ENCOUNTER — Ambulatory Visit: Payer: Medicare Other | Attending: Internal Medicine | Admitting: Physical Therapy

## 2017-11-25 DIAGNOSIS — R293 Abnormal posture: Secondary | ICD-10-CM | POA: Diagnosis not present

## 2017-11-25 DIAGNOSIS — R296 Repeated falls: Secondary | ICD-10-CM

## 2017-11-25 DIAGNOSIS — M546 Pain in thoracic spine: Secondary | ICD-10-CM

## 2017-11-25 DIAGNOSIS — M6281 Muscle weakness (generalized): Secondary | ICD-10-CM | POA: Diagnosis not present

## 2017-11-25 NOTE — Patient Instructions (Signed)
Leg Straightener / Heel Extender    Straighten one leg down. Pull toes AND forefoot toward knee, extend heel. Hold foot position _5__ seconds. Relax the foot. Repeat 1 time. Re-bend knee. Do other leg. Each leg _5__ times. Surface: firm  Copyright  VHI. All rights reserved.   Abduction: Clam (Eccentric) - Side-Lying    Lie on side with knees bent. Lift top knee, keeping feet together. Keep trunk steady. Slowly lower for 3-5 seconds. __10_ reps per set, _2__ sets per day, __5_ days per week.   http://ecce.exer.us/65   Copyright  VHI. All rights reserved.   Tandem Stance    Right foot in front of left, heel touching toe both feet "straight ahead". Stand on Foot Triangle of Support with both feet. Balance in this position __30_ seconds. Do with left foot in front of right. 3x each side every day standing close to counter for support if needed  Copyright  VHI. All rights reserved.   [facility] [address] [place], [place] [address] Phone # [phone] Fax [phone]

## 2017-11-25 NOTE — Therapy (Signed)
[facility] [address] [place], [place], [address] Phone: [phone]   Fax:  [phone]  Physical Therapy Evaluation  Patient Details  Name: [name] MRN: [id number] Date of Birth: 1941/10/10 Referring Provider: Lanice Shirts, MD   Encounter Date: 11/25/2017  PT End of Session - 11/25/17 1339    Visit Number  1    Date for PT Re-Evaluation  01/20/18    Authorization Type  medicare    PT Start Time  1149    PT Stop Time  1230    PT Time Calculation (min)  41 min    Activity Tolerance  Patient tolerated treatment well    Behavior During Therapy  [facility] for tasks assessed/performed       Past Medical History:  Diagnosis Date  . Abdominal pain, unspecified site   . ADVERSE REACTION TO MEDICATION 07/31/2009  . ANEMIA-NOS 07/07/2007  . Arthritis   . Chest pain, unspecified   . Chronic kidney disease    hx of kidney stone  . COLONIC POLYPS, ADENOMATOUS, HX OF   . Disturbance of skin sensation   . DIVERTICULOSIS, COLON 02/23/2009  . Family history of diabetes mellitus   . Family history of malignant neoplasm of breast   . FATIGUE 05/25/2009  . Fever, unspecified   . Heart murmur    hx of  . HYPERLIPIDEMIA 07/07/2007  . HYPERPARATHYROIDISM UNSPECIFIED 10/20/2007  . HYPERTENSION 02/04/2008  . NEPHROLITHIASIS, HX OF 11/15/2008  . OSTEOPOROSIS 01/01/2008  . Other acquired absence of organ    parathyroidectomy  . Pain in limb   . PALPITATIONS, OCCASIONAL 07/12/2008  . PARATHYROIDECTOMY 01/02/2009  . SCOLIOSIS 05/25/2009  . Seasonal allergies   . Unspecified constipation   . URINARY INCONTINENCE 07/07/2007  . UTI'S, RECURRENT 11/20/2009   kimbrough     Past Surgical History:  Procedure Laterality Date  . BUNIONECTOMY    . COLONOSCOPY    . LAPAROSCOPIC APPENDECTOMY  01/19/2016   Procedure: APPENDECTOMY LAPAROSCOPIC with orifice of cecal polyp;  Surgeon: Leighton Ruff, MD;  Location: [facility];  Service:  General;;  . LEFT HEART CATH AND CORONARY ANGIOGRAPHY N/A 04/07/2017   Procedure: Left Heart Cath and Coronary Angiography;  Surgeon: Nelva Bush, MD;  Location: [place] [facility];  Service: Cardiovascular;  Laterality: N/A;  . PARATHYROIDECTOMY     Rt Superior open neck exploration  . POLYPECTOMY    . RIGHT OOPHORECTOMY    . TONSILLECTOMY      There were no vitals filed for this visit.   Subjective Assessment - 11/25/17 1150    Subjective  Pt is known to clinic and has returned due to pain and feeling unsteady.  She reports the pain in her left midback from vertebral fracture is still bothering her.  The other day I have had shooting pain down left leg after sitting for a while and standing from chair.  It feels better after walking but takes a while to get going.  Right hip has been bothering me, hurts when I lie on that side.    Limitations  Standing;Sitting;Walking;House hold activities    Patient Stated Goals  get the leg better, relieve back pain, stand up straighter, balance    Currently in Pain?  Yes    Pain Score  -- 9/10 when standing or starting to walk    Pain Location  Leg    Pain Orientation  Left    Pain Descriptors / Indicators  Hervey Ard  Pain Type  Acute pain    Pain Radiating Towards  down front of left thigh    Pain Onset  In the past 7 days    Aggravating Factors   sitting for a long time    Pain Relieving Factors  sitting or walking    Multiple Pain Sites  Yes    Pain Score  0 7-8/10 when it starts    Pain Location  Back    Pain Orientation  Left    Pain Descriptors / Indicators  Aching    Pain Type  Chronic pain    Pain Onset  More than a month ago    Pain Frequency  Intermittent    Pain Relieving Factors  lying down, medication         [facility] PT Assessment - 11/25/17 0001      Assessment   Medical Diagnosis  M81.0 (ICD-10-CM) - Age-related osteoporosis without current pathological fracture    Referring Provider  Lanice Shirts, MD    Onset  Date/Surgical Date  -- fell 2x in December    Prior Therapy  yes      Precautions   Precautions  Other (comment)    Precaution Comments  Osteoporosis rehab      Restrictions   Weight Bearing Restrictions  No      Balance Screen   Has the patient fallen in the past 6 months  Yes    How many times?  2x in December    Has the patient had a decrease in activity level because of a fear of falling?   Yes    Is the patient reluctant to leave their home because of a fear of falling?   No      Home Social worker  Private residence    Living Arrangements  Spouse/significant other      Prior Function   Level of Independence  Independent      Cognition   Overall Cognitive Status  Within Functional Limits for tasks assessed      Posture/Postural Control   Posture/Postural Control  Postural limitations    Postural Limitations  Rounded Shoulders;Forward head;Increased thoracic kyphosis scoliosis S-curved left lumbar; right thoracic      ROM / Strength   AROM / PROM / Strength  Strength      Strength   Overall Strength Comments  bilateral hip abduction, flexion, knee flexion - 4/5      Palpation   Palpation comment  right gluteal bursa tender to palpation      Ambulation/Gait   Gait Pattern  Decreased step length - right;Decreased step length - left;Trunk flexed      Standardized Balance Assessment   Standardized Balance Assessment  Berg Balance Test      Berg Balance Test   Sit to Stand  Able to stand without using hands and stabilize independently    Standing Unsupported  Able to stand safely 2 minutes    Sitting with Back Unsupported but Feet Supported on Floor or Stool  Able to sit safely and securely 2 minutes    Stand to Sit  Sits safely with minimal use of hands    Transfers  Able to transfer safely, minor use of hands    Standing Unsupported with Eyes Closed  Able to stand 10 seconds safely    Standing Ubsupported with Feet Together  Able to place feet  together independently and stand 1 minute safely    From Standing, Reach  Forward with Outstretched Arm  Can reach forward >12 cm safely (5")    From Standing Position, Pick up Object from [place] to pick up shoe safely and easily    From Standing Position, Turn to Look Behind Over each Shoulder  Looks behind one side only/other side shows less weight shift    Turn 360 Degrees  Able to turn 360 degrees safely in 4 seconds or less    Standing Unsupported, Alternately Place Feet on Step/Stool  Able to stand independently and safely and complete 8 steps in 20 seconds    Standing Unsupported, One Foot in Front  Able to place foot tandem independently and hold 30 seconds    Standing on One Leg  Able to lift leg independently and hold 5-10 seconds 6 seconds Lt; 7 sec Rt    Total Score  53             Objective measurements completed on examination: See above findings.              PT Education - 11/25/17 1337    Education provided  Yes    Education Details  reviewed previous handout with posture education, clams, leg lengthener, tandem    Person(s) Educated  Patient    Methods  Explanation;Demonstration;Verbal cues;Handout;Tactile cues    Comprehension  Verbalized understanding;Returned demonstration       PT Short Term Goals - 11/25/17 1357      PT SHORT TERM GOAL #1   Title  be independent in initial HEP    Time  4    Period  Weeks    Status  New    Target Date  12/23/17      PT SHORT TERM GOAL #2   Title  reduce pain in LE when standing from chair by 30%    Time  4    Period  Weeks    Status  New    Target Date  12/23/17        PT Long Term Goals - 11/25/17 1400      PT LONG TERM GOAL #1   Title  independent with HEP and how to progress herself    Time  6    Period  Weeks    Status  New    Target Date  01/20/18      PT LONG TERM GOAL #2   Title  increase Berg balance to 55/56 for reduced risk of falls    Baseline  53/56    Time  8    Period   Weeks    Status  New    Target Date  01/20/18      PT LONG TERM GOAL #3   Title  reduce pain to stand/walk for > or = to 30 minutes without limitation    Time  8    Period  Weeks    Status  New    Target Date  01/20/18      PT LONG TERM GOAL #4   Title  demonstrate 4+/5 hip abduction for improved core and pelvic stability during ambulation    Time  8    Period  Weeks    Status  New    Target Date  01/20/18      PT LONG TERM GOAL #5   Title  able to descend 4 steps without UE support due to improved balance    Baseline  had to come down on bottom at her friend's house  Time  8    Period  Weeks    Status  New    Target Date  01/20/18             Plan - 11/25/17 1524    Clinical Impression Statement  Pt presents to PT due to osteoporosis and risk of falls.  Pt presents to PT with abnormal posture and gait as described above.  Pt has LE weakness and reduced endurance during standing and walking activities.  Pt has back pain as well as right hip and left leg pain that has been limiting her ability to perform daily functional activities around the house.  Pt has decreased score on Berg balance assessment of 53/56.  Pt will benefit from skilled PT to address impairments and get on exercise plan that is safe and effective for reduction of risk for falls and fractures.    History and Personal Factors relevant to plan of care:  osteoporosis, history of thoracic vertebrea fracture, history of falls    Clinical Presentation  Evolving    Clinical Presentation due to:  Has recently had increased number of falls    Clinical Decision Making  Low    Rehab Potential  Good    Clinical Impairments Affecting Rehab Potential  osteoporosis, history of thoracic vertebrea fracture, history of falls    PT Frequency  2x / week    PT Duration  8 weeks    PT Treatment/Interventions  ADLs/Self Care Home Management;Biofeedback;Cryotherapy;Electrical Stimulation;Moist Heat;Iontophoresis 4mg /ml  Dexamethasone;Ultrasound;Gait training;Stair training;Functional mobility training;Therapeutic activities;Therapeutic exercise;Manual techniques;Patient/family education;Neuromuscular re-education;Balance training;Dry needling;Taping;Passive range of motion    PT Next Visit Plan  posture, balance, strength, hip and back ROM, decompression exercises    Consulted and Agree with Plan of Care  Patient       Patient will benefit from skilled therapeutic intervention in order to improve the following deficits and impairments:  Postural dysfunction, Pain, Decreased strength, Decreased activity tolerance, Difficulty walking  Visit Diagnosis: Abnormal posture  Repeated falls  Pain in thoracic spine  Muscle weakness (generalized)     Problem List Patient Active Problem List   Diagnosis Date Noted  . Coronary artery disease involving native coronary artery of native heart without angina pectoris 04/28/2017  . Syncope 04/07/2017  . Shortness of breath 04/07/2017  . Abnormal stress test 04/07/2017  . Sinus bradycardia 02/21/2017  . Coronary artery calcification 02/21/2017  . Compression fracture of thoracic vertebra ([place]) 02/08/2017  . Dizziness and giddiness 02/08/2017  . Atrial fibrillation with rapid ventricular response ([place]) 01/21/2016  . Colon polyp s/p appy/partialcecetomy 01/19/3016 01/19/2016  . Orthostatic dizziness 04/25/2014  . Vertigo 12/26/2012  . Hypertension, uncontrolled 09/11/2012  . Diastolic dysfunction 97/35/3299  . Sleep disturbance 09/17/2011  . Hypotension 12/26/2010  . Orthostatic hypotension 12/26/2010  . UTI'S, RECURRENT 11/20/2009  . ADVERSE REACTION TO MEDICATION 07/31/2009  . SCOLIOSIS 05/25/2009  . FATIGUE 05/25/2009  . DIVERTICULOSIS, COLON 02/23/2009  . COLONIC POLYPS, ADENOMATOUS, HX OF 02/23/2009  . PARATHYROIDECTOMY 01/02/2009  . NEPHROLITHIASIS, HX OF 11/15/2008  . PALPITATIONS, OCCASIONAL 07/12/2008  . Essential hypertension 02/04/2008  .  Constipation 01/01/2008  . LEG PAIN, LEFT 01/01/2008  . OSTEOPOROSIS 01/01/2008  . HYPERPARATHYROIDISM UNSPECIFIED 10/20/2007  . HYPERLIPIDEMIA 07/07/2007  . ANEMIA-NOS 07/07/2007  . URINARY INCONTINENCE 07/07/2007    Zannie Cove, PT 11/25/2017, 3:31 PM  [facility] [address] [place], [place], [address] Phone: [phone]   Fax:  [phone]  Name: QUINNLEY COLASURDO MRN: [id number]  Date of Birth: 05-04-1941

## 2017-11-28 ENCOUNTER — Encounter: Payer: Self-pay | Admitting: Physical Therapy

## 2017-11-28 ENCOUNTER — Ambulatory Visit: Payer: Medicare Other | Admitting: Physical Therapy

## 2017-11-28 DIAGNOSIS — M546 Pain in thoracic spine: Secondary | ICD-10-CM | POA: Diagnosis not present

## 2017-11-28 DIAGNOSIS — R296 Repeated falls: Secondary | ICD-10-CM | POA: Diagnosis not present

## 2017-11-28 DIAGNOSIS — M6281 Muscle weakness (generalized): Secondary | ICD-10-CM

## 2017-11-28 DIAGNOSIS — R293 Abnormal posture: Secondary | ICD-10-CM | POA: Diagnosis not present

## 2017-11-28 NOTE — Therapy (Signed)
[facility] [address], [place], [place], [address] Phone: [phone]   Fax:  [phone]  Physical Therapy Treatment  Patient Details  Name: [name] MRN: [id number] Date of Birth: 1941-03-07 Referring Provider: Lanice Shirts, MD   Encounter Date: 11/28/2017  PT End of Session - 11/28/17 1043    Visit Number  2    Date for PT Re-Evaluation  01/20/18    Authorization Type  medicare    PT Start Time  1020    PT Stop Time  1100    PT Time Calculation (min)  40 min    Activity Tolerance  Patient tolerated treatment well    Behavior During Therapy  [facility] for tasks assessed/performed       Past Medical History:  Diagnosis Date  . Abdominal pain, unspecified site   . ADVERSE REACTION TO MEDICATION 07/31/2009  . ANEMIA-NOS 07/07/2007  . Arthritis   . Chest pain, unspecified   . Chronic kidney disease    hx of kidney stone  . COLONIC POLYPS, ADENOMATOUS, HX OF   . Disturbance of skin sensation   . DIVERTICULOSIS, COLON 02/23/2009  . Family history of diabetes mellitus   . Family history of malignant neoplasm of breast   . FATIGUE 05/25/2009  . Fever, unspecified   . Heart murmur    hx of  . HYPERLIPIDEMIA 07/07/2007  . HYPERPARATHYROIDISM UNSPECIFIED 10/20/2007  . HYPERTENSION 02/04/2008  . NEPHROLITHIASIS, HX OF 11/15/2008  . OSTEOPOROSIS 01/01/2008  . Other acquired absence of organ    parathyroidectomy  . Pain in limb   . PALPITATIONS, OCCASIONAL 07/12/2008  . PARATHYROIDECTOMY 01/02/2009  . SCOLIOSIS 05/25/2009  . Seasonal allergies   . Unspecified constipation   . URINARY INCONTINENCE 07/07/2007  . UTI'S, RECURRENT 11/20/2009   kimbrough     Past Surgical History:  Procedure Laterality Date  . BUNIONECTOMY    . COLONOSCOPY    . LAPAROSCOPIC APPENDECTOMY  01/19/2016   Procedure: APPENDECTOMY LAPAROSCOPIC with orifice of cecal polyp;  Surgeon: Leighton Ruff, MD;  Location: [facility];  Service:  General;;  . LEFT HEART CATH AND CORONARY ANGIOGRAPHY N/A 04/07/2017   Procedure: Left Heart Cath and Coronary Angiography;  Surgeon: Nelva Bush, MD;  Location: [place] [facility];  Service: Cardiovascular;  Laterality: N/A;  . PARATHYROIDECTOMY     Rt Superior open neck exploration  . POLYPECTOMY    . RIGHT OOPHORECTOMY    . TONSILLECTOMY      There were no vitals filed for this visit.  Subjective Assessment - 11/28/17 1037    Subjective  Pt states her left leg and hip is really bothering her today and she presents to clinic with lateral trunk leaning to the right and antalgic gait with decreased stance on left leg.    Limitations  Standing;Sitting;Walking;House hold activities    Patient Stated Goals  get the leg better, relieve back pain, stand up straighter, balance    Currently in Pain?  Yes    Pain Score  8     Pain Location  Hip    Pain Orientation  Left    Pain Descriptors / Indicators  Sharp    Pain Type  Acute pain    Pain Radiating Towards  down front of thigh    Pain Onset  1 to 4 weeks ago    Aggravating Factors   standing and walking    Pain Relieving Factors  sitting or lying down  Effect of Pain on Daily Activities  walking    Multiple Pain Sites  No                      [facility] Adult PT Treatment/Exercise - 11/28/17 0001      Exercises   Exercises  Knee/Hip      Lumbar Exercises: Supine   Other Supine Lumbar Exercises  leg lengthening and hip extension with leg straight - hold 5 sec x 10    Other Supine Lumbar Exercises  lumbar rotations small ROM  keeping core braced - 10x each way      Knee/Hip Exercises: Stretches   Piriformis Stretch  Both;2 reps;20 seconds      Manual Therapy   Manual Therapy  Soft tissue mobilization;Muscle Energy Technique    Soft tissue mobilization  left glutes, piriformis, TFL    Muscle Energy Technique  contract and hold 20 sec left hip extension for posterior rotation - 3 bouts of 20 seconds                PT Short Term Goals - 11/28/17 1104      PT SHORT TERM GOAL #1   Title  be independent in initial HEP    Time  4    Period  Weeks    Status  On-going      PT SHORT TERM GOAL #2   Title  reduce pain in LE when standing from chair by 30%    Time  4    Period  Weeks    Status  On-going        PT Long Term Goals - 11/25/17 1400      PT LONG TERM GOAL #1   Title  independent with HEP and how to progress herself    Time  6    Period  Weeks    Status  New    Target Date  01/20/18      PT LONG TERM GOAL #2   Title  increase Berg balance to 55/56 for reduced risk of falls    Baseline  53/56    Time  8    Period  Weeks    Status  New    Target Date  01/20/18      PT LONG TERM GOAL #3   Title  reduce pain to stand/walk for > or = to 30 minutes without limitation    Time  8    Period  Weeks    Status  New    Target Date  01/20/18      PT LONG TERM GOAL #4   Title  demonstrate 4+/5 hip abduction for improved core and pelvic stability during ambulation    Time  8    Period  Weeks    Status  New    Target Date  01/20/18      PT LONG TERM GOAL #5   Title  able to descend 4 steps without UE support due to improved balance    Baseline  had to come down on bottom at her friend's house    Time  8    Period  Weeks    Status  New    Target Date  01/20/18            Plan - 11/28/17 1043    Clinical Impression Statement  Patient has more pain in left hip and leg today.  Muscle spasms in left glutes, piriformis were palpated.  She is having sharp pain in the left leg when putting all her weight on the leg.  Pt felt a little better after today's treatment but is still walking with antalgic gait.  Pt will benefit from skilled PT to work on core and pelvic stability in order to improved functional activities and posture for reduced risk of fracture and falls.    PT Treatment/Interventions  ADLs/Self Care Home Management;Biofeedback;Cryotherapy;Electrical  Stimulation;Moist Heat;Iontophoresis 4mg /ml Dexamethasone;Ultrasound;Gait training;Stair training;Functional mobility training;Therapeutic activities;Therapeutic exercise;Manual techniques;Patient/family education;Neuromuscular re-education;Balance training;Dry needling;Taping;Passive range of motion    PT Next Visit Plan  posture, balance, strength, hip and back ROM, decompression exercises, right lumbar side bending in supine    Consulted and Agree with Plan of Care  Patient       Patient will benefit from skilled therapeutic intervention in order to improve the following deficits and impairments:  Postural dysfunction, Pain, Decreased strength, Decreased activity tolerance, Difficulty walking  Visit Diagnosis: Abnormal posture  Repeated falls  Pain in thoracic spine  Muscle weakness (generalized)     Problem List Patient Active Problem List   Diagnosis Date Noted  . Coronary artery disease involving native coronary artery of native heart without angina pectoris 04/28/2017  . Syncope 04/07/2017  . Shortness of breath 04/07/2017  . Abnormal stress test 04/07/2017  . Sinus bradycardia 02/21/2017  . Coronary artery calcification 02/21/2017  . Compression fracture of thoracic vertebra ([place]) 02/08/2017  . Dizziness and giddiness 02/08/2017  . Atrial fibrillation with rapid ventricular response ([place]) 01/21/2016  . Colon polyp s/p appy/partialcecetomy 01/19/3016 01/19/2016  . Orthostatic dizziness 04/25/2014  . Vertigo 12/26/2012  . Hypertension, uncontrolled 09/11/2012  . Diastolic dysfunction 76/19/5093  . Sleep disturbance 09/17/2011  . Hypotension 12/26/2010  . Orthostatic hypotension 12/26/2010  . UTI'S, RECURRENT 11/20/2009  . ADVERSE REACTION TO MEDICATION 07/31/2009  . SCOLIOSIS 05/25/2009  . FATIGUE 05/25/2009  . DIVERTICULOSIS, COLON 02/23/2009  . COLONIC POLYPS, ADENOMATOUS, HX OF 02/23/2009  . PARATHYROIDECTOMY 01/02/2009  . NEPHROLITHIASIS, HX OF 11/15/2008  .  PALPITATIONS, OCCASIONAL 07/12/2008  . Essential hypertension 02/04/2008  . Constipation 01/01/2008  . LEG PAIN, LEFT 01/01/2008  . OSTEOPOROSIS 01/01/2008  . HYPERPARATHYROIDISM UNSPECIFIED 10/20/2007  . HYPERLIPIDEMIA 07/07/2007  . ANEMIA-NOS 07/07/2007  . URINARY INCONTINENCE 07/07/2007    Zannie Cove, PT 11/28/2017, 11:05 AM  [facility] [address] [place], [place], [address] Phone: [phone]   Fax:  [phone]  Name: [name] MRN: [id number] Date of Birth: [birth year]

## 2017-12-02 ENCOUNTER — Ambulatory Visit: Payer: Medicare Other | Admitting: Physical Therapy

## 2017-12-02 ENCOUNTER — Encounter: Payer: Self-pay | Admitting: Physical Therapy

## 2017-12-02 DIAGNOSIS — M6281 Muscle weakness (generalized): Secondary | ICD-10-CM

## 2017-12-02 DIAGNOSIS — M546 Pain in thoracic spine: Secondary | ICD-10-CM | POA: Diagnosis not present

## 2017-12-02 DIAGNOSIS — R296 Repeated falls: Secondary | ICD-10-CM | POA: Diagnosis not present

## 2017-12-02 DIAGNOSIS — R293 Abnormal posture: Secondary | ICD-10-CM

## 2017-12-02 NOTE — Therapy (Signed)
[facility] [address], [place], [place], [address] Phone: [phone]   Fax:  [phone]  Physical Therapy Treatment  Patient Details  Name: [name] MRN: [id number] Date of Birth: 11-25-1940 Referring Provider: Lanice Shirts, MD   Encounter Date: 12/02/2017  PT End of Session - 12/02/17 1408    Visit Number  3    Date for PT Re-Evaluation  01/20/18    Authorization Type  medicare    PT Start Time  1402    PT Stop Time  1442    PT Time Calculation (min)  40 min    Activity Tolerance  Patient tolerated treatment well    Behavior During Therapy  [facility] for tasks assessed/performed       Past Medical History:  Diagnosis Date  . Abdominal pain, unspecified site   . ADVERSE REACTION TO MEDICATION 07/31/2009  . ANEMIA-NOS 07/07/2007  . Arthritis   . Chest pain, unspecified   . Chronic kidney disease    hx of kidney stone  . COLONIC POLYPS, ADENOMATOUS, HX OF   . Disturbance of skin sensation   . DIVERTICULOSIS, COLON 02/23/2009  . Family history of diabetes mellitus   . Family history of malignant neoplasm of breast   . FATIGUE 05/25/2009  . Fever, unspecified   . Heart murmur    hx of  . HYPERLIPIDEMIA 07/07/2007  . HYPERPARATHYROIDISM UNSPECIFIED 10/20/2007  . HYPERTENSION 02/04/2008  . NEPHROLITHIASIS, HX OF 11/15/2008  . OSTEOPOROSIS 01/01/2008  . Other acquired absence of organ    parathyroidectomy  . Pain in limb   . PALPITATIONS, OCCASIONAL 07/12/2008  . PARATHYROIDECTOMY 01/02/2009  . SCOLIOSIS 05/25/2009  . Seasonal allergies   . Unspecified constipation   . URINARY INCONTINENCE 07/07/2007  . UTI'S, RECURRENT 11/20/2009   kimbrough     Past Surgical History:  Procedure Laterality Date  . BUNIONECTOMY    . COLONOSCOPY    . LAPAROSCOPIC APPENDECTOMY  01/19/2016   Procedure: APPENDECTOMY LAPAROSCOPIC with orifice of cecal polyp;  Surgeon: Leighton Ruff, MD;  Location: [facility];  Service:  General;;  . LEFT HEART CATH AND CORONARY ANGIOGRAPHY N/A 04/07/2017   Procedure: Left Heart Cath and Coronary Angiography;  Surgeon: Nelva Bush, MD;  Location: [facility];  Service: Cardiovascular;  Laterality: N/A;  . PARATHYROIDECTOMY     Rt Superior open neck exploration  . POLYPECTOMY    . RIGHT OOPHORECTOMY    . TONSILLECTOMY      There were no vitals filed for this visit.  Subjective Assessment - 12/02/17 1406    Subjective  Pt states her right side is feeling better but when the left hurts it is shooting pain from low back down to mid-thigh.  Denies pain currently    Limitations  Standing;Sitting;Walking;House hold activities    Patient Stated Goals  get the leg better, relieve back pain, stand up straighter, balance    Currently in Pain?  No/denies                      [facility] Adult PT Treatment/Exercise - 12/02/17 0001      Ambulation/Gait   Gait Pattern  Decreased step length - right;Decreased step length - left;Trunk flexed;Trendelenburg    Pre-Gait Activities  weight shifting and walking with cues to contract glutes       Lumbar Exercises: Supine   Pelvic Tilt  20 reps;5 seconds    Bridge with [employer]  20  reps with brace    Large Ball Abdominal Isometric  10 reps rolling out red pball    Other Supine Lumbar Exercises  leg lengthening and marching with knee bent - hold 5 sec x 10    Other Supine Lumbar Exercises  lumbar rotations small ROM  keeping core braced - 10x each way      Knee/Hip Exercises: Seated   Clamshell with TheraBand  Yellow 20x single leg      Manual Therapy   Manual Therapy  Manual Traction    Manual Traction  bilateral LE for lumbar traction - 2 x 30 sec hold               PT Short Term Goals - 11/28/17 1104      PT SHORT TERM GOAL #1   Title  be independent in initial HEP    Time  4    Period  Weeks    Status  On-going      PT SHORT TERM GOAL #2   Title  reduce pain in LE when standing from chair  by 30%    Time  4    Period  Weeks    Status  On-going        PT Long Term Goals - 11/25/17 1400      PT LONG TERM GOAL #1   Title  independent with HEP and how to progress herself    Time  6    Period  Weeks    Status  New    Target Date  01/20/18      PT LONG TERM GOAL #2   Title  increase Berg balance to 55/56 for reduced risk of falls    Baseline  53/56    Time  8    Period  Weeks    Status  New    Target Date  01/20/18      PT LONG TERM GOAL #3   Title  reduce pain to stand/walk for > or = to 30 minutes without limitation    Time  8    Period  Weeks    Status  New    Target Date  01/20/18      PT LONG TERM GOAL #4   Title  demonstrate 4+/5 hip abduction for improved core and pelvic stability during ambulation    Time  8    Period  Weeks    Status  New    Target Date  01/20/18      PT LONG TERM GOAL #5   Title  able to descend 4 steps without UE support due to improved balance    Baseline  had to come down on bottom at her friend's house    Time  8    Period  Weeks    Status  New    Target Date  01/20/18            Plan - 12/02/17 1623    Clinical Impression Statement  Patient was feeling better today and has been independent with her clamshell exercise at home.  Pt did well with exercises but had some pain when intially getting up and started walking.  Pain comes on when she puts weight on her left leg while in a little hip ER, when she does some internal rotation the pain goes away.  She was able to decrease pain with cues to contract glutes during gait as well.  Pt will benefit from skilled PT to continue  to work on improved glute and hip stability.    Rehab Potential  Good    Clinical Impairments Affecting Rehab Potential  osteoporosis, history of thoracic vertebrea fracture, history of falls    PT Treatment/Interventions  ADLs/Self Care Home Management;Biofeedback;Cryotherapy;Electrical Stimulation;Moist Heat;Iontophoresis 4mg /ml  Dexamethasone;Ultrasound;Gait training;Stair training;Functional mobility training;Therapeutic activities;Therapeutic exercise;Manual techniques;Patient/family education;Neuromuscular re-education;Balance training;Dry needling;Taping;Passive range of motion    PT Next Visit Plan  posture, balance, glute strength, LE and core strength, hip and back ROM, decompression exercises    Consulted and Agree with Plan of Care  Patient       Patient will benefit from skilled therapeutic intervention in order to improve the following deficits and impairments:  Postural dysfunction, Pain, Decreased strength, Decreased activity tolerance, Difficulty walking  Visit Diagnosis: Abnormal posture  Repeated falls  Pain in thoracic spine  Muscle weakness (generalized)     Problem List Patient Active Problem List   Diagnosis Date Noted  . Coronary artery disease involving native coronary artery of native heart without angina pectoris 04/28/2017  . Syncope 04/07/2017  . Shortness of breath 04/07/2017  . Abnormal stress test 04/07/2017  . Sinus bradycardia 02/21/2017  . Coronary artery calcification 02/21/2017  . Compression fracture of thoracic vertebra ([place]) 02/08/2017  . Dizziness and giddiness 02/08/2017  . Atrial fibrillation with rapid ventricular response ([place]) 01/21/2016  . Colon polyp s/p appy/partialcecetomy 01/19/3016 01/19/2016  . Orthostatic dizziness 04/25/2014  . Vertigo 12/26/2012  . Hypertension, uncontrolled 09/11/2012  . Diastolic dysfunction 31/49/7026  . Sleep disturbance 09/17/2011  . Hypotension 12/26/2010  . Orthostatic hypotension 12/26/2010  . UTI'S, RECURRENT 11/20/2009  . ADVERSE REACTION TO MEDICATION 07/31/2009  . SCOLIOSIS 05/25/2009  . FATIGUE 05/25/2009  . DIVERTICULOSIS, COLON 02/23/2009  . COLONIC POLYPS, ADENOMATOUS, HX OF 02/23/2009  . PARATHYROIDECTOMY 01/02/2009  . NEPHROLITHIASIS, HX OF 11/15/2008  . PALPITATIONS, OCCASIONAL 07/12/2008  . Essential  hypertension 02/04/2008  . Constipation 01/01/2008  . LEG PAIN, LEFT 01/01/2008  . OSTEOPOROSIS 01/01/2008  . HYPERPARATHYROIDISM UNSPECIFIED 10/20/2007  . HYPERLIPIDEMIA 07/07/2007  . ANEMIA-NOS 07/07/2007  . URINARY INCONTINENCE 07/07/2007    Zannie Cove, PT 12/02/2017, 4:41 PM  [facility] [address], [place], [place], [address] Phone: [phone]   Fax:  [phone]  Name: [name] MRN: [id number] Date of Birth: 12-Jul-1941

## 2017-12-05 ENCOUNTER — Encounter: Payer: Self-pay | Admitting: Physical Therapy

## 2017-12-05 ENCOUNTER — Ambulatory Visit: Payer: Medicare Other | Admitting: Physical Therapy

## 2017-12-05 DIAGNOSIS — R296 Repeated falls: Secondary | ICD-10-CM

## 2017-12-05 DIAGNOSIS — R293 Abnormal posture: Secondary | ICD-10-CM | POA: Diagnosis not present

## 2017-12-05 DIAGNOSIS — M6281 Muscle weakness (generalized): Secondary | ICD-10-CM

## 2017-12-05 DIAGNOSIS — M546 Pain in thoracic spine: Secondary | ICD-10-CM | POA: Diagnosis not present

## 2017-12-05 NOTE — Patient Instructions (Signed)
   BRIDGING ELASTIC BAND ABDUCTION  While lying on your back, place an elastic band around your knees and pull your knees apart. Hold this and then tighten your lower abdominals, squeeze your buttocks and raise your buttocks off the floor/bed as creating a "Bridge" with your body.  Repeat 2 sets of 10 reps     Cross legs and bring them up to your chest.  Hold 10 sec and repeat 5x  [facility] [address] [place], [place] [address] Phone # [phone] Fax [phone]

## 2017-12-05 NOTE — Therapy (Signed)
[facility] [address], [place], [place], [address] Phone: [phone]   Fax:  [phone]  Physical Therapy Treatment  Patient Details  Name: [name] MRN: [id number] Date of Birth: 1941/03/20 Referring Provider: Lanice Shirts, MD   Encounter Date: 12/05/2017  PT End of Session - 12/05/17 1023    Visit Number  4    Date for PT Re-Evaluation  01/20/18    Authorization Type  medicare    PT Start Time  1018    PT Stop Time  1058    PT Time Calculation (min)  40 min    Activity Tolerance  Patient tolerated treatment well    Behavior During Therapy  [facility] for tasks assessed/performed       Past Medical History:  Diagnosis Date  . Abdominal pain, unspecified site   . ADVERSE REACTION TO MEDICATION 07/31/2009  . ANEMIA-NOS 07/07/2007  . Arthritis   . Chest pain, unspecified   . Chronic kidney disease    hx of kidney stone  . COLONIC POLYPS, ADENOMATOUS, HX OF   . Disturbance of skin sensation   . DIVERTICULOSIS, COLON 02/23/2009  . Family history of diabetes mellitus   . Family history of malignant neoplasm of breast   . FATIGUE 05/25/2009  . Fever, unspecified   . Heart murmur    hx of  . HYPERLIPIDEMIA 07/07/2007  . HYPERPARATHYROIDISM UNSPECIFIED 10/20/2007  . HYPERTENSION 02/04/2008  . NEPHROLITHIASIS, HX OF 11/15/2008  . OSTEOPOROSIS 01/01/2008  . Other acquired absence of organ    parathyroidectomy  . Pain in limb   . PALPITATIONS, OCCASIONAL 07/12/2008  . PARATHYROIDECTOMY 01/02/2009  . SCOLIOSIS 05/25/2009  . Seasonal allergies   . Unspecified constipation   . URINARY INCONTINENCE 07/07/2007  . UTI'S, RECURRENT 11/20/2009   kimbrough     Past Surgical History:  Procedure Laterality Date  . BUNIONECTOMY    . COLONOSCOPY    . LAPAROSCOPIC APPENDECTOMY  01/19/2016   Procedure: APPENDECTOMY LAPAROSCOPIC with orifice of cecal polyp;  Surgeon: Leighton Ruff, MD;  [facility];  Service:  General;;  . LEFT HEART CATH AND CORONARY ANGIOGRAPHY N/A 04/07/2017   Procedure: Left Heart Cath and Coronary Angiography;  Surgeon: Nelva Bush, MD;  Location: [facility];  Service: Cardiovascular;  Laterality: N/A;  . PARATHYROIDECTOMY     Rt Superior open neck exploration  . POLYPECTOMY    . RIGHT OOPHORECTOMY    . TONSILLECTOMY      There were no vitals filed for this visit.  Subjective Assessment - 12/05/17 1212    Subjective  Pt reports she is feeling a little better but it is going very slowly.  She continues to have difficulty putting weight onto the left leg. Denies pain currently just feeling it intermittently when walking.    Limitations  Standing;Sitting;Walking;House hold activities    Patient Stated Goals  get the leg better, relieve back pain, stand up straighter, balance    Currently in Pain?  No/denies                      [facility] Adult PT Treatment/Exercise - 12/05/17 0001      Lumbar Exercises: Supine   Ab Set  10 reps;3 seconds with knees bent alternating knee out to the side    Bridge with clamshell  20 reps with TrA bracing; red band    Other Supine Lumbar Exercises  leg lengthener stretch with towel folded in  a square under sacrum      Knee/Hip Exercises: Stretches   Piriformis Stretch  Right;Left;5 reps;10 seconds    Other Knee/Hip Stretches  butterfly stretch 10 sec hold x 5      Knee/Hip Exercises: Aerobic   Nustep  L1 x 5 min      Knee/Hip Exercises: Seated   Sit to Sand  10 reps;without UE support with glute squeeze on standing             PT Education - 12/05/17 1059    Education provided  Yes    Education Details  cross leg stretch and bridge with band    Person(s) Educated  Patient    Methods  Explanation;Demonstration;Handout;Verbal cues;Tactile cues    Comprehension  Verbalized understanding;Returned demonstration       PT Short Term Goals - 12/05/17 1208      PT SHORT TERM GOAL #1   Title  be  independent in initial HEP    Time  4    Period  Weeks    Status  Achieved      PT SHORT TERM GOAL #2   Title  reduce pain in LE when standing from chair by 30%    Baseline  able to stand today after treatment without pain, performed sit to stand 10x painfree    Time  4    Period  Weeks    Status  On-going      PT SHORT TERM GOAL #3   Title  ...    Baseline  ...      PT SHORT TERM GOAL #4   Title  ....        PT Long Term Goals - 11/25/17 1400      PT LONG TERM GOAL #1   Title  independent with HEP and how to progress herself    Time  6    Period  Weeks    Status  New    Target Date  01/20/18      PT LONG TERM GOAL #2   Title  increase Berg balance to 55/56 for reduced risk of falls    Baseline  53/56    Time  8    Period  Weeks    Status  New    Target Date  01/20/18      PT LONG TERM GOAL #3   Title  reduce pain to stand/walk for > or = to 30 minutes without limitation    Time  8    Period  Weeks    Status  New    Target Date  01/20/18      PT LONG TERM GOAL #4   Title  demonstrate 4+/5 hip abduction for improved core and pelvic stability during ambulation    Time  8    Period  Weeks    Status  New    Target Date  01/20/18      PT LONG TERM GOAL #5   Title  able to descend 4 steps without UE support due to improved balance    Baseline  had to come down on bottom at her friend's house    Time  8    Period  Weeks    Status  New    Target Date  01/20/18            Plan - 12/05/17 1204    Clinical Impression Statement  Patient felt good after exercises and stretches today.  She continues to  have pain with FWB standing on left LE.  Pt has tenderness to glute med attachments on left side.  She was unable to perform seated hip rotation exercises in open chain sitting position due to increased pain. This demonstrates some inflammation of the muscle attachments of left hip rotators.  Pt did well with exercises in supine and will benefit from skilled PT to  progress hip and core strength to return to full function and improved gait.    Rehab Potential  Good    Clinical Impairments Affecting Rehab Potential  osteoporosis, history of thoracic vertebrea fracture, history of falls    PT Treatment/Interventions  ADLs/Self Care Home Management;Biofeedback;Cryotherapy;Electrical Stimulation;Moist Heat;Iontophoresis 4mg /ml Dexamethasone;Ultrasound;Gait training;Stair training;Functional mobility training;Therapeutic activities;Therapeutic exercise;Manual techniques;Patient/family education;Neuromuscular re-education;Balance training;Dry needling;Taping;Passive range of motion    PT Next Visit Plan  posture, balance, glute strength, LE and core strength, hip and back ROM, decompression exercises, nustep    Consulted and Agree with Plan of Care  Patient       Patient will benefit from skilled therapeutic intervention in order to improve the following deficits and impairments:  Postural dysfunction, Pain, Decreased strength, Decreased activity tolerance, Difficulty walking  Visit Diagnosis: Abnormal posture  Repeated falls  Pain in thoracic spine  Muscle weakness (generalized)     Problem List Patient Active Problem List   Diagnosis Date Noted  . Coronary artery disease involving native coronary artery of native heart without angina pectoris 04/28/2017  . Syncope 04/07/2017  . Shortness of breath 04/07/2017  . Abnormal stress test 04/07/2017  . Sinus bradycardia 02/21/2017  . Coronary artery calcification 02/21/2017  . Compression fracture of thoracic vertebra ([place]) 02/08/2017  . Dizziness and giddiness 02/08/2017  . Atrial fibrillation with rapid ventricular response ([place]) 01/21/2016  . Colon polyp s/p appy/partialcecetomy 01/19/3016 01/19/2016  . Orthostatic dizziness 04/25/2014  . Vertigo 12/26/2012  . Hypertension, uncontrolled 09/11/2012  . Diastolic dysfunction 17/40/8144  . Sleep disturbance 09/17/2011  . Hypotension 12/26/2010  .  Orthostatic hypotension 12/26/2010  . UTI'S, RECURRENT 11/20/2009  . ADVERSE REACTION TO MEDICATION 07/31/2009  . SCOLIOSIS 05/25/2009  . FATIGUE 05/25/2009  . DIVERTICULOSIS, COLON 02/23/2009  . COLONIC POLYPS, ADENOMATOUS, HX OF 02/23/2009  . PARATHYROIDECTOMY 01/02/2009  . NEPHROLITHIASIS, HX OF 11/15/2008  . PALPITATIONS, OCCASIONAL 07/12/2008  . Essential hypertension 02/04/2008  . Constipation 01/01/2008  . LEG PAIN, LEFT 01/01/2008  . OSTEOPOROSIS 01/01/2008  . HYPERPARATHYROIDISM UNSPECIFIED 10/20/2007  . HYPERLIPIDEMIA 07/07/2007  . ANEMIA-NOS 07/07/2007  . URINARY INCONTINENCE 07/07/2007    Zannie Cove, PT 12/05/2017, 12:14 PM   [facility] [address], [place], [place], [address] Phone: [phone]   Fax:  [phone]  Name: [name] MRN: [id number] Date of Birth: [birth year]

## 2017-12-09 ENCOUNTER — Ambulatory Visit: Payer: Medicare Other | Admitting: Physical Therapy

## 2017-12-09 DIAGNOSIS — M546 Pain in thoracic spine: Secondary | ICD-10-CM

## 2017-12-09 DIAGNOSIS — R293 Abnormal posture: Secondary | ICD-10-CM

## 2017-12-09 DIAGNOSIS — M6281 Muscle weakness (generalized): Secondary | ICD-10-CM

## 2017-12-09 DIAGNOSIS — R296 Repeated falls: Secondary | ICD-10-CM

## 2017-12-09 NOTE — Therapy (Signed)
[facility] [address] [place], [place], [address] Phone: [phone]   Fax:  [phone]  Physical Therapy Treatment  Patient Details  Name: [name] MRN: [id number] Date of Birth: 11-Jul-1941 Referring Provider: Lanice Shirts, MD   Encounter Date: 12/09/2017  PT End of Session - 12/09/17 1411    Visit Number  5    Date for PT Re-Evaluation  01/20/18    Authorization Type  medicare    PT Start Time  1403    PT Stop Time  1443    PT Time Calculation (min)  40 min    Activity Tolerance  Patient tolerated treatment well    Behavior During Therapy  [facility] for tasks assessed/performed       Past Medical History:  Diagnosis Date  . Abdominal pain, unspecified site   . ADVERSE REACTION TO MEDICATION 07/31/2009  . ANEMIA-NOS 07/07/2007  . Arthritis   . Chest pain, unspecified   . Chronic kidney disease    hx of kidney stone  . COLONIC POLYPS, ADENOMATOUS, HX OF   . Disturbance of skin sensation   . DIVERTICULOSIS, COLON 02/23/2009  . Family history of diabetes mellitus   . Family history of malignant neoplasm of breast   . FATIGUE 05/25/2009  . Fever, unspecified   . Heart murmur    hx of  . HYPERLIPIDEMIA 07/07/2007  . HYPERPARATHYROIDISM UNSPECIFIED 10/20/2007  . HYPERTENSION 02/04/2008  . NEPHROLITHIASIS, HX OF 11/15/2008  . OSTEOPOROSIS 01/01/2008  . Other acquired absence of organ    parathyroidectomy  . Pain in limb   . PALPITATIONS, OCCASIONAL 07/12/2008  . PARATHYROIDECTOMY 01/02/2009  . SCOLIOSIS 05/25/2009  . Seasonal allergies   . Unspecified constipation   . URINARY INCONTINENCE 07/07/2007  . UTI'S, RECURRENT 11/20/2009   kimbrough     Past Surgical History:  Procedure Laterality Date  . BUNIONECTOMY    . COLONOSCOPY    . LAPAROSCOPIC APPENDECTOMY  01/19/2016   Procedure: APPENDECTOMY LAPAROSCOPIC with orifice of cecal polyp;  Surgeon: Leighton Ruff, MD;  Location: [facility];  Service:  General;;  . LEFT HEART CATH AND CORONARY ANGIOGRAPHY N/A 04/07/2017   Procedure: Left Heart Cath and Coronary Angiography;  Surgeon: Nelva Bush, MD;  Location: [facility];  Service: Cardiovascular;  Laterality: N/A;  . PARATHYROIDECTOMY     Rt Superior open neck exploration  . POLYPECTOMY    . RIGHT OOPHORECTOMY    . TONSILLECTOMY      There were no vitals filed for this visit.  Subjective Assessment - 12/09/17 1408    Subjective  I felt very good on Friday but then Saturday morning I woke up and have had the pain back ever since.  I have not done the exercises because it hurts to move my leg.      Limitations  Standing;Sitting;Walking;House hold activities    Patient Stated Goals  get the leg better, relieve back pain, stand up straighter, balance    Currently in Pain?  Yes    Pain Score  5  goes from 5 to a 9 when walking    Pain Location  Hip    Pain Orientation  Left    Pain Descriptors / Indicators  Shooting    Pain Type  Acute pain    Pain Onset  1 to 4 weeks ago    Aggravating Factors   hip flexion getting into the car and walking on it    Pain Relieving  Factors  sitting or lying down    Effect of Pain on Daily Activities  walking    Multiple Pain Sites  No                      [facility] Adult PT Treatment/Exercise - 12/09/17 0001      Neuro Re-ed    Neuro Re-ed Details   TrA contraction with function squat and during core and hip exercises      Lumbar Exercises: Supine   Ab Set  10 reps;3 seconds    Clam  20 reps single leg - red band    Bent Knee Raise  10 reps alt LE with TrA contraction    Other Supine Lumbar Exercises  leg lengthener stretch and knee out to the side keeping TrA engaged with towel folded in a square under sacrum      Knee/Hip Exercises: Stretches   Piriformis Stretch  Right;Left;5 reps;10 seconds      Knee/Hip Exercises: Aerobic   Nustep  L2 x 6 min      Knee/Hip Exercises: Standing   Functional Squat  10 reps cues  for keeping knees behind toes    Rebounder  1 min x 3 ways    Other Standing Knee Exercises  standing with weight shifting into heel/toe - no UE support      Knee/Hip Exercises: Supine   Bridges  Strengthening;Both;20 reps               PT Short Term Goals - 12/05/17 1208      PT SHORT TERM GOAL #1   Title  be independent in initial HEP    Time  4    Period  Weeks    Status  Achieved      PT SHORT TERM GOAL #2   Title  reduce pain in LE when standing from chair by 30%    Baseline  able to stand today after treatment without pain, performed sit to stand 10x painfree    Time  4    Period  Weeks    Status  On-going      PT SHORT TERM GOAL #3   Title  ...    Baseline  ...      PT SHORT TERM GOAL #4   Title  ....        PT Long Term Goals - 12/09/17 1445      PT LONG TERM GOAL #1   Title  independent with HEP and how to progress herself    Time  6    Period  Weeks    Status  On-going      PT LONG TERM GOAL #2   Title  increase Berg balance to 55/56 for reduced risk of falls    Time  8    Period  Weeks    Status  On-going      PT LONG TERM GOAL #3   Title  reduce pain to stand/walk for > or = to 30 minutes without limitation    Time  8    Period  Weeks    Status  On-going      PT LONG TERM GOAL #4   Title  demonstrate 4+/5 hip abduction for improved core and pelvic stability during ambulation    Time  8    Period  Weeks    Status  On-going      PT LONG TERM GOAL #5   Title  able  to descend 4 steps without UE support due to improved balance    Time  8    Period  Weeks    Status  On-going            Plan - 12/09/17 1430    Clinical Impression Statement  Patient did well with exercises.  She was able to feel less pain and greater strength after performing several reps of the hip exercises.  She had no pain after today's treatment.  Pt educated on importance of doing the exercises at home.  Pt will benefit from skilled PT to work on  strength  and balance.    Clinical Impairments Affecting Rehab Potential  osteoporosis, history of thoracic vertebrea fracture, history of falls    PT Treatment/Interventions  ADLs/Self Care Home Management;Biofeedback;Cryotherapy;Electrical Stimulation;Moist Heat;Iontophoresis 4mg /ml Dexamethasone;Ultrasound;Gait training;Stair training;Functional mobility training;Therapeutic activities;Therapeutic exercise;Manual techniques;Patient/family education;Neuromuscular re-education;Balance training;Dry needling;Taping;Passive range of motion    PT Next Visit Plan  posture, balance, glute strength, LE and core strength, hip and back ROM, decompression exercises, nustep    Recommended Other Services  initial orders from 11/27/17 signed    Consulted and Agree with Plan of Care  Patient       Patient will benefit from skilled therapeutic intervention in order to improve the following deficits and impairments:  Postural dysfunction, Pain, Decreased strength, Decreased activity tolerance, Difficulty walking  Visit Diagnosis: Abnormal posture  Repeated falls  Pain in thoracic spine  Muscle weakness (generalized)     Problem List Patient Active Problem List   Diagnosis Date Noted  . Coronary artery disease involving native coronary artery of native heart without angina pectoris 04/28/2017  . Syncope 04/07/2017  . Shortness of breath 04/07/2017  . Abnormal stress test 04/07/2017  . Sinus bradycardia 02/21/2017  . Coronary artery calcification 02/21/2017  . Compression fracture of thoracic vertebra ([place]) 02/08/2017  . Dizziness and giddiness 02/08/2017  . Atrial fibrillation with rapid ventricular response ([place]) 01/21/2016  . Colon polyp s/p appy/partialcecetomy 01/19/3016 01/19/2016  . Orthostatic dizziness 04/25/2014  . Vertigo 12/26/2012  . Hypertension, uncontrolled 09/11/2012  . Diastolic dysfunction 08/10/3006  . Sleep disturbance 09/17/2011  . Hypotension 12/26/2010  . Orthostatic hypotension  12/26/2010  . UTI'S, RECURRENT 11/20/2009  . ADVERSE REACTION TO MEDICATION 07/31/2009  . SCOLIOSIS 05/25/2009  . FATIGUE 05/25/2009  . DIVERTICULOSIS, COLON 02/23/2009  . COLONIC POLYPS, ADENOMATOUS, HX OF 02/23/2009  . PARATHYROIDECTOMY 01/02/2009  . NEPHROLITHIASIS, HX OF 11/15/2008  . PALPITATIONS, OCCASIONAL 07/12/2008  . Essential hypertension 02/04/2008  . Constipation 01/01/2008  . LEG PAIN, LEFT 01/01/2008  . OSTEOPOROSIS 01/01/2008  . HYPERPARATHYROIDISM UNSPECIFIED 10/20/2007  . HYPERLIPIDEMIA 07/07/2007  . ANEMIA-NOS 07/07/2007  . URINARY INCONTINENCE 07/07/2007    Zannie Cove, PT 12/09/2017, 2:48 PM  [facility] [address], [place], [place], [address] Phone: [phone]   Fax:  [phone]  Name: NARELY NOBLES MRN: [id number] Date of Birth: 03-20-1941

## 2017-12-11 ENCOUNTER — Encounter: Payer: Medicare Other | Admitting: Physical Therapy

## 2017-12-16 ENCOUNTER — Encounter: Payer: Medicare Other | Admitting: Physical Therapy

## 2017-12-17 DIAGNOSIS — I480 Paroxysmal atrial fibrillation: Secondary | ICD-10-CM | POA: Diagnosis not present

## 2017-12-17 DIAGNOSIS — I10 Essential (primary) hypertension: Secondary | ICD-10-CM | POA: Diagnosis not present

## 2017-12-17 DIAGNOSIS — M810 Age-related osteoporosis without current pathological fracture: Secondary | ICD-10-CM | POA: Diagnosis not present

## 2017-12-17 DIAGNOSIS — I2510 Atherosclerotic heart disease of native coronary artery without angina pectoris: Secondary | ICD-10-CM | POA: Diagnosis not present

## 2017-12-18 ENCOUNTER — Emergency Department (HOSPITAL_COMMUNITY): Payer: Medicare Other

## 2017-12-18 ENCOUNTER — Other Ambulatory Visit: Payer: Self-pay

## 2017-12-18 ENCOUNTER — Encounter (HOSPITAL_COMMUNITY): Payer: Self-pay | Admitting: Emergency Medicine

## 2017-12-18 ENCOUNTER — Emergency Department (HOSPITAL_COMMUNITY)
Admission: EM | Admit: 2017-12-18 | Discharge: 2017-12-18 | Disposition: A | Discharge status: Home / Self Care | Payer: Medicare Other | Attending: Emergency Medicine | Admitting: Emergency Medicine

## 2017-12-18 DIAGNOSIS — Z7982 Long term (current) use of aspirin: Secondary | ICD-10-CM | POA: Insufficient documentation

## 2017-12-18 DIAGNOSIS — J0100 Acute maxillary sinusitis, unspecified: Secondary | ICD-10-CM

## 2017-12-18 DIAGNOSIS — I119 Hypertensive heart disease without heart failure: Secondary | ICD-10-CM | POA: Insufficient documentation

## 2017-12-18 DIAGNOSIS — R51 Headache: Secondary | ICD-10-CM | POA: Diagnosis not present

## 2017-12-18 DIAGNOSIS — R05 Cough: Secondary | ICD-10-CM | POA: Diagnosis present

## 2017-12-18 DIAGNOSIS — Z79899 Other long term (current) drug therapy: Secondary | ICD-10-CM | POA: Diagnosis not present

## 2017-12-18 DIAGNOSIS — I2510 Atherosclerotic heart disease of native coronary artery without angina pectoris: Secondary | ICD-10-CM | POA: Diagnosis not present

## 2017-12-18 DIAGNOSIS — J329 Chronic sinusitis, unspecified: Secondary | ICD-10-CM | POA: Diagnosis not present

## 2017-12-18 DIAGNOSIS — E860 Dehydration: Secondary | ICD-10-CM

## 2017-12-18 DIAGNOSIS — J019 Acute sinusitis, unspecified: Secondary | ICD-10-CM | POA: Diagnosis not present

## 2017-12-18 DIAGNOSIS — R110 Nausea: Secondary | ICD-10-CM | POA: Diagnosis not present

## 2017-12-18 DIAGNOSIS — R030 Elevated blood-pressure reading, without diagnosis of hypertension: Secondary | ICD-10-CM | POA: Diagnosis not present

## 2017-12-18 DIAGNOSIS — G4489 Other headache syndrome: Secondary | ICD-10-CM | POA: Diagnosis not present

## 2017-12-18 DIAGNOSIS — R519 Headache, unspecified: Secondary | ICD-10-CM

## 2017-12-18 LAB — CBC WITH DIFFERENTIAL/PLATELET
Basophils Absolute: 0 10*3/uL (ref 0.0–0.1)
Basophils Relative: 0 %
Eosinophils Absolute: 0 10*3/uL (ref 0.0–0.7)
Eosinophils Relative: 0 %
HCT: 40.6 % (ref 36.0–46.0)
Hemoglobin: 13.7 g/dL (ref 12.0–15.0)
Lymphocytes Relative: 10 %
Lymphs Abs: 0.4 10*3/uL — ABNORMAL LOW (ref 0.7–4.0)
MCH: 31.9 pg (ref 26.0–34.0)
MCHC: 33.7 g/dL (ref 30.0–36.0)
MCV: 94.4 fL (ref 78.0–100.0)
Monocytes Absolute: 0.5 10*3/uL (ref 0.1–1.0)
Monocytes Relative: 11 %
Neutro Abs: 3.5 10*3/uL (ref 1.7–7.7)
Neutrophils Relative %: 79 %
Platelets: 165 10*3/uL (ref 150–400)
RBC: 4.3 MIL/uL (ref 3.87–5.11)
RDW: 13.1 % (ref 11.5–15.5)
WBC: 4.4 10*3/uL (ref 4.0–10.5)

## 2017-12-18 LAB — COMPREHENSIVE METABOLIC PANEL
ALT: 18 U/L (ref 14–54)
AST: 29 U/L (ref 15–41)
Albumin: 3.9 g/dL (ref 3.5–5.0)
Alkaline Phosphatase: 57 U/L (ref 38–126)
Anion gap: 15 (ref 5–15)
BUN: 9 mg/dL (ref 6–20)
CO2: 20 mmol/L — ABNORMAL LOW (ref 22–32)
Calcium: 8.1 mg/dL — ABNORMAL LOW (ref 8.9–10.3)
Chloride: 102 mmol/L (ref 101–111)
Creatinine, Ser: 0.7 mg/dL (ref 0.44–1.00)
GFR calc Af Amer: >60 mL/min (ref >60)
GFR calc non Af Amer: >60 mL/min (ref >60)
Glucose, Bld: 108 mg/dL — ABNORMAL HIGH (ref 65–99)
Potassium: 3.8 mmol/L (ref 3.5–5.1)
Sodium: 137 mmol/L (ref 135–145)
Total Bilirubin: 1 mg/dL (ref 0.3–1.2)
Total Protein: 6.3 g/dL — ABNORMAL LOW (ref 6.5–8.1)

## 2017-12-18 LAB — URINALYSIS, ROUTINE W REFLEX MICROSCOPIC
Bilirubin Urine: NEGATIVE
Glucose, UA: NEGATIVE mg/dL
Hgb urine dipstick: NEGATIVE
Ketones, ur: 80 mg/dL — AB
Leukocytes, UA: NEGATIVE
Nitrite: NEGATIVE
Protein, ur: NEGATIVE mg/dL
Specific Gravity, Urine: 1.019 (ref 1.005–1.030)
pH: 5 (ref 5.0–8.0)

## 2017-12-18 MED ORDER — AMOXICILLIN 500 MG PO CAPS: 1000.0000 mg | ORAL_CAPSULE | Freq: Two times a day (BID) | ORAL | 0 refills | Status: DC

## 2017-12-18 MED ORDER — ONDANSETRON HCL 4 MG/2ML IJ SOLN
4.0000 mg | Freq: Once | INTRAMUSCULAR | Status: AC
  Administered 2017-12-18: 4 mg via INTRAVENOUS
  Filled 2017-12-18: qty 2

## 2017-12-18 MED ORDER — AMOXICILLIN 500 MG PO CAPS
1000.0000 mg | ORAL_CAPSULE | Freq: Once | ORAL | Status: AC
  Administered 2017-12-18: 1000 mg via ORAL
  Filled 2017-12-18: qty 2

## 2017-12-18 MED ORDER — FENTANYL CITRATE (PF) 100 MCG/2ML IJ SOLN
50.0000 ug | Freq: Once | INTRAMUSCULAR | Status: AC
  Administered 2017-12-18: 50 ug via INTRAVENOUS
  Filled 2017-12-18: qty 2

## 2017-12-18 MED ORDER — ACETAMINOPHEN 325 MG PO TABS
650.0000 mg | ORAL_TABLET | Freq: Once | ORAL | Status: AC
  Administered 2017-12-18: 650 mg via ORAL
  Filled 2017-12-18: qty 2

## 2017-12-18 NOTE — ED Provider Notes (Signed)
[facility] EMERGENCY DEPARTMENT Provider Note   CSN: [id number] Arrival date & time: 12/18/17  0447     History   Chief Complaint Chief Complaint  Patient presents with  . Headache    HPI [name] is a 77 y.o. female.  Patient with a history of HTN, parathyroid disease, HLD, presents with cough, congestion, sinus pain, nausea and vomiting for several days. She woke up yesterday with marked improvement in her URI symptoms but with gradual onset headache that started yesterday afternoon. She feels generally ill and spends most of her day in bed. She was not aware of fever. She states the headache is associated with photophobia.   The history is provided by the patient. No language interpreter was used.    Past Medical History:  Diagnosis Date  . Abdominal pain, unspecified site   . ADVERSE REACTION TO MEDICATION 07/31/2009  . ANEMIA-NOS 07/07/2007  . Arthritis   . Chest pain, unspecified   . Chronic kidney disease    hx of kidney stone  . COLONIC POLYPS, ADENOMATOUS, HX OF   . Disturbance of skin sensation   . DIVERTICULOSIS, COLON 02/23/2009  . Family history of diabetes mellitus   . Family history of malignant neoplasm of breast   . FATIGUE 05/25/2009  . Fever, unspecified   . Heart murmur    hx of  . HYPERLIPIDEMIA 07/07/2007  . HYPERPARATHYROIDISM UNSPECIFIED 10/20/2007  . HYPERTENSION 02/04/2008  . NEPHROLITHIASIS, HX OF 11/15/2008  . OSTEOPOROSIS 01/01/2008  . Other acquired absence of organ    parathyroidectomy  . Pain in limb   . PALPITATIONS, OCCASIONAL 07/12/2008  . PARATHYROIDECTOMY 01/02/2009  . SCOLIOSIS 05/25/2009  . Seasonal allergies   . Unspecified constipation   . URINARY INCONTINENCE 07/07/2007  . UTI'S, RECURRENT 11/20/2009   kimbrough     Patient Active Problem List   Diagnosis Date Noted  . Coronary artery disease involving native coronary artery of native heart without angina pectoris 04/28/2017  . Syncope 04/07/2017  .  Shortness of breath 04/07/2017  . Abnormal stress test 04/07/2017  . Sinus bradycardia 02/21/2017  . Coronary artery calcification 02/21/2017  . Compression fracture of thoracic vertebra ([place]) 02/08/2017  . Dizziness and giddiness 02/08/2017  . Atrial fibrillation with rapid ventricular response ([place]) 01/21/2016  . Colon polyp s/p appy/partialcecetomy 01/19/3016 01/19/2016  . Orthostatic dizziness 04/25/2014  . Vertigo 12/26/2012  . Hypertension, uncontrolled 09/11/2012  . Diastolic dysfunction 24/23/5361  . Sleep disturbance 09/17/2011  . Hypotension 12/26/2010  . Orthostatic hypotension 12/26/2010  . UTI'S, RECURRENT 11/20/2009  . ADVERSE REACTION TO MEDICATION 07/31/2009  . SCOLIOSIS 05/25/2009  . FATIGUE 05/25/2009  . DIVERTICULOSIS, COLON 02/23/2009  . COLONIC POLYPS, ADENOMATOUS, HX OF 02/23/2009  . PARATHYROIDECTOMY 01/02/2009  . NEPHROLITHIASIS, HX OF 11/15/2008  . PALPITATIONS, OCCASIONAL 07/12/2008  . Essential hypertension 02/04/2008  . Constipation 01/01/2008  . LEG PAIN, LEFT 01/01/2008  . OSTEOPOROSIS 01/01/2008  . HYPERPARATHYROIDISM UNSPECIFIED 10/20/2007  . HYPERLIPIDEMIA 07/07/2007  . ANEMIA-NOS 07/07/2007  . URINARY INCONTINENCE 07/07/2007    Past Surgical History:  Procedure Laterality Date  . BUNIONECTOMY    . COLONOSCOPY    . LAPAROSCOPIC APPENDECTOMY  01/19/2016   Procedure: APPENDECTOMY LAPAROSCOPIC with orifice of cecal polyp;  Surgeon: Leighton Ruff, MD;  Location: [facility];  Service: General;;  . LEFT HEART CATH AND CORONARY ANGIOGRAPHY N/A 04/07/2017   Procedure: Left Heart Cath and Coronary Angiography;  Surgeon: Nelva Bush, MD;  Location: [facility];  Service:  Cardiovascular;  Laterality: N/A;  . PARATHYROIDECTOMY     Rt Superior open neck exploration  . POLYPECTOMY    . RIGHT OOPHORECTOMY    . TONSILLECTOMY      OB History    Gravida Para Term Preterm AB Living   3         3   SAB TAB Ectopic Multiple Live Births                     Home Medications    Prior to Admission medications   Medication Sig Start Date End Date Taking? Authorizing Provider  acetaminophen (TYLENOL) 500 MG tablet Take 1,000 mg by mouth 2 (two) times daily as needed for moderate pain.    [provider]  aspirin 81 MG tablet Take 81 mg by mouth every evening.     [provider]  atorvastatin (LIPITOR) 20 MG tablet TAKE 1 TABLET EACH DAY. Patient taking differently: TAKE 1 TABLET EACH DAY IN THE EVENING 03/21/17   End, Harrell Gave, MD  cholecalciferol (VITAMIN D) 1000 UNITS tablet Take 1,000 Units by mouth every evening.     [provider]  diphenhydramine-acetaminophen (TYLENOL PM) 25-500 MG TABS tablet Take 0.5 tablets by mouth at bedtime as needed (SLEEP).    [provider]  losartan (COZAAR) 25 MG tablet TAKE 1 TABLET ONCE DAILY. 11/10/17   Lyda Jester M, PA-C  nitroGLYCERIN (NITROSTAT) 0.4 MG SL tablet Place 1 tablet (0.4 mg total) under the tongue every 5 (five) minutes as needed for chest pain. 03/27/17 06/25/17  End, Harrell Gave, MD    Family History Family History  Problem Relation Age of Onset  . Sudden death Father 10  . Heart disease Father 5       MI  . Heart disease Mother        valve replacement  . Breast cancer Other        1st degree relative <50  . Diabetes Unknown        1st degree relative  . Colon cancer Neg Hx   . Stomach cancer Neg Hx     Social History Social History   Tobacco Use  . Smoking status: Never Smoker  . Smokeless tobacco: Never Used  Substance Use Topics  . Alcohol use: Yes  . Drug use: No     Allergies   Anesthetics, amide and Lisinopril   Review of Systems Review of Systems  Constitutional: Negative for chills and fever.  HENT: Positive for congestion, sinus pressure and sinus pain. Negative for sore throat.   Respiratory: Positive for cough and chest tightness.   Cardiovascular: Negative.  Negative for chest pain.   Gastrointestinal: Positive for nausea and vomiting. Negative for abdominal pain.  Genitourinary: Negative.  Negative for dysuria.  Musculoskeletal: Negative.  Negative for neck stiffness.  Skin: Negative.   Neurological: Positive for weakness. Negative for syncope and light-headedness.  Psychiatric/Behavioral: Negative for confusion.     Physical Exam Updated Vital Signs BP (!) 168/83 (BP Location: Right Arm)   Pulse 63   Temp (!) 100.9 F (38.3 C)   Resp 16   Ht 5\' 1"  (1.549 m)   Wt 50.8 kg (112 lb)   SpO2 99%   BMI 21.16 kg/m   Physical Exam  Constitutional: She is oriented to person, place, and time. She appears well-developed and well-nourished.  Non-toxic appearance. No distress.  HENT:  Head: Normocephalic and atraumatic.  Nose: Right sinus exhibits maxillary sinus tenderness and frontal  sinus tenderness. Left sinus exhibits maxillary sinus tenderness and frontal sinus tenderness.  Eyes: EOM are normal. Pupils are equal, round, and reactive to light.  Neck: Normal range of motion. Neck supple.  Cardiovascular: Normal rate and regular rhythm.  Pulmonary/Chest: Effort normal and breath sounds normal.  Abdominal: Soft. Bowel sounds are normal. There is no tenderness. There is no rebound and no guarding.  Musculoskeletal: Normal range of motion.  Neurological: She is alert and oriented to person, place, and time. She has normal strength. No sensory deficit. Coordination normal. GCS eye subscore is 4. GCS verbal subscore is 5. GCS motor subscore is 6.  CN's 3-12 grossly intact. Speech is clear and focused. No facial asymmetry. No lateralizing weakness. No deficits of coordination.      Skin: Skin is warm and dry. No rash noted.  Psychiatric: She has a normal mood and affect.     ED Treatments / Results  Labs (all labs ordered are listed, but only abnormal results are displayed) Labs Reviewed  CBC WITH DIFFERENTIAL/PLATELET  COMPREHENSIVE METABOLIC PANEL  URINALYSIS,  ROUTINE W REFLEX MICROSCOPIC    EKG  EKG Interpretation  Date/Time:  Thursday December 18 2017 04:49:52 EST Ventricular Rate:  66 PR Interval:    QRS Duration: 98 QT Interval:  439 QTC Calculation: 460 R Axis:   -23 Text Interpretation:  Sinus rhythm Borderline left axis deviation No significant change since last tracing Confirmed by Pryor Curia [phone]) on 12/18/2017 4:56:17 AM       Radiology No results found.  Procedures Procedures (including critical care time)  Medications Ordered in ED Medications  fentaNYL (SUBLIMAZE) injection 50 mcg (not administered)  ondansetron (ZOFRAN) injection 4 mg (not administered)     Initial Impression / Assessment and Plan / ED Course  I have reviewed the triage vital signs and the nursing notes.  Pertinent labs & imaging results that were available during my care of the patient were reviewed by me and considered in my medical decision making (see chart for details).     Patient presents for evaluation of fever, headache, congestion, sinus pain, cough.   She is nontoxic in appearance. Normal neurologic exam in evaluation of the headache. There is bilateral maxillary>frontal sinus tenderness. CT confirms dx sinusitis.   UA shows keytones without infection, suggesting mild dehydration. IVF's offered but patient would like to be discharged home and will hydrate orally.   She continues to be well appearing. Symptoms improved with medications. She is examined by Dr. Leonides Schanz and found appropriate for discharge home.   Final Clinical Impressions(s) / ED Diagnoses   Final diagnoses:  None   1. Sinusitis 2. Mild dehydration  ED Discharge Orders    None       Charlann Lange, PA-C 12/18/17 Morton, Delice Bison, DO 12/18/17 [phone]

## 2017-12-18 NOTE — ED Triage Notes (Addendum)
Patient arrived with EMS from home reports headache with mild nausea , poor appetite and fatigue onset yesterday , denies head injury , no emesis , mild photophobia , she has not taken her antihypertensive medications yesterday . CBG= 167 by  EMS .

## 2017-12-18 NOTE — Discharge Instructions (Signed)
Take Tylenol for any fever or aches. You can take antihistamines (ie Claritin, Zyrtec) for relief of congestion. Push noncaffeinated fluids to keep hydrated. Take amoxicillin as directed for sinus infection. Return here with any severe headache pain, uncontrolled vomiting, high fever or new concern.

## 2017-12-18 NOTE — ED Notes (Signed)
Patient transported to CT scan . 

## 2017-12-19 ENCOUNTER — Encounter: Payer: Medicare Other | Admitting: Physical Therapy

## 2017-12-25 ENCOUNTER — Ambulatory Visit (INDEPENDENT_AMBULATORY_CARE_PROVIDER_SITE_OTHER): Payer: Medicare Other | Admitting: Gastroenterology

## 2017-12-25 ENCOUNTER — Encounter: Payer: Self-pay | Admitting: Gastroenterology

## 2017-12-25 VITALS — BP 140/70 | HR 68 | Ht 61.0 in | Wt 111.6 lb

## 2017-12-25 DIAGNOSIS — D126 Benign neoplasm of colon, unspecified: Secondary | ICD-10-CM

## 2017-12-25 DIAGNOSIS — K582 Mixed irritable bowel syndrome: Secondary | ICD-10-CM | POA: Diagnosis not present

## 2017-12-25 NOTE — Progress Notes (Signed)
[name]    [id number]    04-Oct-1941  Primary Care Physician:Schoenhoff, Altamese Cabal, MD  Referring Physician: Lanice Shirts, MD [address] [place], [place] [address]  Chief complaint:  Constipation alternating with diarrhea  HPI: 38 yr F here for follow up visit. She was last seen in office December 2017. Tubulovillous adenoma with high grade dysplasia in the cecum/appendicial orifice s/p limited resection of cecum and appendectomy with no involvement of margins. Patient was recommended 1 year follow up, she is hesitant to undergo anymore colonoscopies. She has chronic irritable bowel syndrome with alternating constipation and diarrhea. She doesn't have bowel movement for 2-3 days followed by multiple bowel movements for 1-2 days, mostly formed to semi formed. Denies any abdominal pain, nausea, vomiting, melena or blood per rectum.    Outpatient Encounter Medications as of 12/25/2017  Medication Sig  . amoxicillin (AMOXIL) 500 MG capsule Take 2 capsules (1,000 mg total) by mouth 2 (two) times daily.  [name] aspirin 81 MG tablet Take 81 mg by mouth every evening.   [name] atorvastatin (LIPITOR) 20 MG tablet TAKE 1 TABLET EACH DAY. (Patient taking differently: TAKE 1 TABLET EACH DAY IN THE EVENING)  . Cholecalciferol (VITAMIN D3) 5000 units CAPS Take 1 capsule by mouth daily.  [name] denosumab (PROLIA) 60 MG/ML SOLN injection Inject 60 mg into the skin every 6 (six) months. Administer in upper arm, thigh, or abdomen  . diphenhydramine-acetaminophen (TYLENOL PM) 25-500 MG TABS tablet Take 0.5 tablets by mouth at bedtime as needed (SLEEP).  [name] losartan (COZAAR) 25 MG tablet TAKE 1 TABLET ONCE DAILY.  . Magnesium 400 MG TABS Take 1 tablet by mouth 2 (two) times daily.  . Multiple Vitamins-Minerals (MULTIVITAMIN ADULT PO) Take 1 capsule by mouth daily. Bone Up-multivitamin with calcium  . [DISCONTINUED] acetaminophen (TYLENOL) 500 MG tablet Take 1,000 mg by mouth 2 (two)  times daily as needed for moderate pain.  . [DISCONTINUED] cholecalciferol (VITAMIN D) 1000 UNITS tablet Take 1,000 Units by mouth every evening.   . [DISCONTINUED] nitroGLYCERIN (NITROSTAT) 0.4 MG SL tablet Place 1 tablet (0.4 mg total) under the tongue every 5 (five) minutes as needed for chest pain.   No facility-administered encounter medications on file as of 12/25/2017.     Allergies as of 12/25/2017 - Review Complete 12/25/2017  Allergen Reaction Noted  . Anesthetics, amide Other (See Comments) 02/20/2016  . Lisinopril Cough 10/05/2009    Past Medical History:  Diagnosis Date  . Abdominal pain, unspecified site   . ADVERSE REACTION TO MEDICATION 07/31/2009  . ANEMIA-NOS 07/07/2007  . Arthritis   . Chest pain, unspecified   . Chronic kidney disease    hx of kidney stone  . COLONIC POLYPS, ADENOMATOUS, HX OF   . Disturbance of skin sensation   . DIVERTICULOSIS, COLON 02/23/2009  . Family history of diabetes mellitus   . Family history of malignant neoplasm of breast   . FATIGUE 05/25/2009  . Fever, unspecified   . Heart murmur    hx of  . HYPERLIPIDEMIA 07/07/2007  . HYPERPARATHYROIDISM UNSPECIFIED 10/20/2007  . HYPERTENSION 02/04/2008  . NEPHROLITHIASIS, HX OF 11/15/2008  . OSTEOPOROSIS 01/01/2008  . Other acquired absence of organ    parathyroidectomy  . Pain in limb   . PALPITATIONS, OCCASIONAL 07/12/2008  . PARATHYROIDECTOMY 01/02/2009  . SCOLIOSIS 05/25/2009  . Seasonal allergies   . Unspecified constipation   . URINARY INCONTINENCE 07/07/2007  .  UTI'S, RECURRENT 11/20/2009   kimbrough     Past Surgical History:  Procedure Laterality Date  . BUNIONECTOMY    . COLONOSCOPY    . LAPAROSCOPIC APPENDECTOMY  01/19/2016   Procedure: APPENDECTOMY LAPAROSCOPIC with orifice of cecal polyp;  Surgeon: Leighton Ruff, MD;  Location: [facility];  Service: General;;  . LEFT HEART CATH AND CORONARY ANGIOGRAPHY N/A 04/07/2017   Procedure: Left Heart Cath and Coronary Angiography;  Surgeon: Nelva Bush, MD;  Location: [facility];  Service: Cardiovascular;  Laterality: N/A;  . PARATHYROIDECTOMY     Rt Superior open neck exploration  . POLYPECTOMY    . RIGHT OOPHORECTOMY    . TONSILLECTOMY      Family History  Problem Relation Age of Onset  . Sudden death Father 1  . Heart disease Father 34       MI  . Heart disease Mother        valve replacement  . Diabetes Unknown        1st degree relative  . Breast cancer Paternal Aunt   . Colon cancer Neg Hx   . Stomach cancer Neg Hx     Social History   Socioeconomic History  . Marital status: Married    Spouse name: Not on file  . Number of children: 3  . Years of education: Not on file  . Highest education level: Not on file  Social Needs  . Financial resource strain: Not on file  . Food insecurity - worry: Not on file  . Food insecurity - inability: Not on file  . Transportation needs - medical: Not on file  . Transportation needs - non-medical: Not on file  Occupational History  . Occupation: travel Primary school teacher: RETIRED  Tobacco Use  . Smoking status: Never Smoker  . Smokeless tobacco: Never Used  Substance and Sexual Activity  . Alcohol use: Yes    Comment: 1 bottle wine week  . Drug use: No  . Sexual activity: No  Other Topics Concern  . Not on file  Social History Narrative   HH of 5    Kids at home now   Married   travel agent.    2-3 c coffee in am    ocass wine    G3P2vaginal delivery   Pt cell [phone]               Review of systems: Review of Systems  Constitutional: Negative for fever and chills.  HENT: Negative.   Eyes: Negative for blurred vision.  Respiratory: Negative for cough, shortness of breath and wheezing.   Cardiovascular: Negative for chest pain and palpitations.  Gastrointestinal: as per HPI Genitourinary: Negative for dysuria, urgency, frequency and hematuria.  Musculoskeletal: Negative for myalgias, back pain and joint pain.  Skin: Negative for  itching and rash.  Neurological: Negative for dizziness, tremors, focal weakness, seizures and loss of consciousness.  Endo/Heme/Allergies: Positive for seasonal allergies.  Psychiatric/Behavioral: Negative for depression, suicidal ideas and hallucinations.  All other systems reviewed and are negative.   Physical Exam: Vitals:   12/25/17 0937  BP: 140/70  Pulse: 68   Body mass index is 21.09 kg/m. Gen:      No acute distress HEENT:  EOMI, sclera anicteric Neck:     No masses; no thyromegaly Lungs:    Clear to auscultation bilaterally; normal respiratory effort CV:         Regular rate and rhythm; no murmurs Abd:      +  bowel sounds; soft, non-tender; no palpable masses, no distension Ext:    No edema; adequate peripheral perfusion Skin:      Warm and dry; no rash Neuro: alert and oriented x 3 Psych: normal mood and affect  Data Reviewed:  Reviewed labs, radiology imaging, old records and pertinent past GI work up 01/19/2016 Colon, segmental resection for tumor, portion of cecum with appendix - COLONIC ADENOMA WITH FOCI OF HIGH GRADE GLANDULAR DYSPLASIA. - MARGINS ARE NEGATIVE.  Assessment and Plan/Recommendations:  41 yr F with h/o tubular adenoma of appendiceal orifice s/p removal with extended appendectomy here with complaints of alternating constipation and diarrhea Increase water intake to atleast 10-12 cups a day Avoid drinks with high sugar contents, artificial sweeteners and soda Hold taking Chia or flax seeds Start Benefiber 1 tablespoon TID with meals Trial of Amitiza 8cg BID, will adjust dose based on response Patient is reluctant to do the bowel prep to undergo surveillance colonoscopy. She does understand that she is taking a risk by stopping surveillance CT abd & pelvis  March 2018 did not show any colonic thickening. She will need to do bowel prep even for virtual colonoscopy  25 minutes was spent face-to-face with the patient. Greater than 50% of the time  used for counseling as well as treatment plan and follow-up. She had multiple questions which were answered to her satisfaction  K. Denzil Magnuson , MD [phone] Mon-Fri 8a-5p [phone] after 5p, weekends, holidays  CC: Schoenhoff, Altamese Cabal, *    Bowel purge will help with new start and then she can start taking benefiber and Amitiza  Benefiber 1 tablespoons with meals  We can adjust the dose based on response, can decrease the amitiza or go up, same with benefiber  I am starting on the lowest dose   Will hold off bowel purge  12 cups of water daily atleast  Juice and sports drinks can also cause diarrhea  If you are dehydrated, constipation gets worse  We can bring you back in few weeks to check how everything is going

## 2017-12-25 NOTE — Patient Instructions (Signed)
Take Benefiber three times a day with meals\  Stop Flaxseed   Discontinue Miralax   Take the Amitiza samples 8 mcg twice a day , Lower the dose as needed  If these samples work for you please contact our office for a prescription   Increase water intake to 10 -12 cups per day  Stop Juices and sports drinks because this can cause diarrhea

## 2017-12-26 ENCOUNTER — Ambulatory Visit: Payer: Medicare Other | Admitting: Gastroenterology

## 2017-12-29 DIAGNOSIS — J0110 Acute frontal sinusitis, unspecified: Secondary | ICD-10-CM | POA: Diagnosis not present

## 2017-12-30 ENCOUNTER — Ambulatory Visit: Payer: Medicare Other | Attending: Internal Medicine | Admitting: Physical Therapy

## 2017-12-30 ENCOUNTER — Encounter: Payer: Self-pay | Admitting: Physical Therapy

## 2017-12-30 DIAGNOSIS — R293 Abnormal posture: Secondary | ICD-10-CM | POA: Insufficient documentation

## 2017-12-30 DIAGNOSIS — R296 Repeated falls: Secondary | ICD-10-CM | POA: Insufficient documentation

## 2017-12-30 DIAGNOSIS — M546 Pain in thoracic spine: Secondary | ICD-10-CM | POA: Diagnosis not present

## 2017-12-30 DIAGNOSIS — M6281 Muscle weakness (generalized): Secondary | ICD-10-CM | POA: Diagnosis not present

## 2017-12-30 NOTE — Patient Instructions (Signed)
Abduction: Clam (Eccentric) - Side-Lying    Lie on side with knees bent. Lift top knee, keeping feet together. Keep trunk steady. Slowly lower for 3-5 seconds. __20_ reps per set, _1__ sets per day, ___ days per week. Add ___ lbs when you achieve ___ repetitions.  http://ecce.exer.us/65   Copyright  VHI. All rights reserved.  Leg Lengthener: Full    Straighten left leg. Pull toes AND forefoot toward knee, extend heel. Lengthen leg by pulling pelvis away from ribs. Hold _5__ seconds. Relax. Repeat 10 time.  Surface: floor or bed   Copyright  VHI. All rights reserved.   Side Bend, Sitting    Sit in a chair with feet on floor. Bend to the left, feel the right ribcage open and low back stretch as you inhale. Hold _5-10__ seconds. To increase stretch, reach arm over to the opposite side  Repeat __10_ times per session. Do __1_ sessions per day.  Copyright  VHI. All rights reserved.   Balance - standing and marching  [facility] [address] [place], [place] [address] Phone # [phone] Fax [phone]

## 2017-12-30 NOTE — Therapy (Signed)
[facility] [address] [place], [place], [address] Phone: [phone]   Fax:  [phone]  Physical Therapy Treatment  Patient Details  Name: [name] MRN: [id number] Date of Birth: 02/22/1941 Referring Provider: Lanice Shirts, MD   Encounter Date: 12/30/2017  PT End of Session - 12/30/17 0936    Visit Number  6    Date for PT Re-Evaluation  01/20/18    Authorization Type  medicare    PT Start Time  0933    PT Stop Time  1013    PT Time Calculation (min)  40 min    Activity Tolerance  Patient tolerated treatment well    Behavior During Therapy  [facility] for tasks assessed/performed       Past Medical History:  Diagnosis Date  . Abdominal pain, unspecified site   . ADVERSE REACTION TO MEDICATION 07/31/2009  . ANEMIA-NOS 07/07/2007  . Arthritis   . Chest pain, unspecified   . Chronic kidney disease    hx of kidney stone  . COLONIC POLYPS, ADENOMATOUS, HX OF   . Disturbance of skin sensation   . DIVERTICULOSIS, COLON 02/23/2009  . Family history of diabetes mellitus   . Family history of malignant neoplasm of breast   . FATIGUE 05/25/2009  . Fever, unspecified   . Heart murmur    hx of  . HYPERLIPIDEMIA 07/07/2007  . HYPERPARATHYROIDISM UNSPECIFIED 10/20/2007  . HYPERTENSION 02/04/2008  . NEPHROLITHIASIS, HX OF 11/15/2008  . OSTEOPOROSIS 01/01/2008  . Other acquired absence of organ    parathyroidectomy  . Pain in limb   . PALPITATIONS, OCCASIONAL 07/12/2008  . PARATHYROIDECTOMY 01/02/2009  . SCOLIOSIS 05/25/2009  . Seasonal allergies   . Unspecified constipation   . URINARY INCONTINENCE 07/07/2007  . UTI'S, RECURRENT 11/20/2009   kimbrough     Past Surgical History:  Procedure Laterality Date  . BUNIONECTOMY    . COLONOSCOPY    . LAPAROSCOPIC APPENDECTOMY  01/19/2016   Procedure: APPENDECTOMY LAPAROSCOPIC with orifice of cecal polyp;  Surgeon: Leighton Ruff, MD;  Location: [facility];  Service:  General;;  . LEFT HEART CATH AND CORONARY ANGIOGRAPHY N/A 04/07/2017   Procedure: Left Heart Cath and Coronary Angiography;  Surgeon: Nelva Bush, MD;  Location: [facility];  Service: Cardiovascular;  Laterality: N/A;  . PARATHYROIDECTOMY     Rt Superior open neck exploration  . POLYPECTOMY    . RIGHT OOPHORECTOMY    . TONSILLECTOMY      There were no vitals filed for this visit.  Subjective Assessment - 12/30/17 0938    Subjective  I still have the pain in the leg and my posture    Limitations  Standing;Sitting;Walking;House hold activities    Patient Stated Goals  get the leg better, relieve back pain, stand up straighter, balance    Currently in Pain?  Yes    Pain Score  3     Pain Location  Back    Pain Orientation  Right    Pain Descriptors / Indicators  Aching    Pain Type  Chronic pain    Pain Score  5    Pain Location  Leg    Pain Orientation  Left    Pain Descriptors / Indicators  Aching    Pain Type  Acute pain    Pain Radiating Towards  down front of the thigh  [facility] PT Treatment/Exercise - 12/30/17 0001      Lumbar Exercises: Seated   Other Seated Lumbar Exercises  left SB stretch      Lumbar Exercises: Supine   Ab Set  10 reps    Other Supine Lumbar Exercises  leg lengthener stretch and knee out to the side keeping TrA engaged with towel folded in a square under sacrum      Knee/Hip Exercises: Aerobic   Nustep  L1 x 11 min PT present to discuss progress and treatment      Knee/Hip Exercises: Sidelying   Clams  20x bilateral      Manual Therapy   Manual Therapy  Soft tissue mobilization    Soft tissue mobilization  right lumbar paraspinals and QL             PT Education - 12/30/17 1019    Education provided  Yes    Education Details  HEP    Person(s) Educated  Patient    Methods  Explanation;Demonstration    Comprehension  Verbalized understanding;Returned demonstration       PT Short  Term Goals - 12/30/17 0953      PT SHORT TERM GOAL #1   Title  be independent in initial HEP    Time  4    Period  Weeks    Status  Achieved      PT SHORT TERM GOAL #2   Title  reduce pain in LE when standing from chair by 30%    Time  4    Period  Weeks    Status  Achieved        PT Long Term Goals - 12/30/17 7846      PT LONG TERM GOAL #1   Title  independent with HEP and how to progress herself    Time  6    Period  Weeks    Status  On-going      PT LONG TERM GOAL #2   Title  increase Berg balance to 55/56 for reduced risk of falls    Time  8    Period  Weeks    Status  On-going      PT LONG TERM GOAL #3   Title  reduce pain to stand/walk for > or = to 30 minutes without limitation    Time  8    Period  Weeks    Status  On-going      PT LONG TERM GOAL #4   Title  demonstrate 4+/5 hip abduction for improved core and pelvic stability during ambulation    Time  8    Period  Weeks    Status  On-going      PT LONG TERM GOAL #5   Title  able to descend 4 steps without UE support due to improved balance    Baseline  no problem going up and down stairs at my house, but need a railing    Time  8    Period  Weeks    Status  On-going            Plan - 12/30/17 0936    Clinical Impression Statement  Pt was sick since her previous appointment and was unable to get out of bed much, so she had a little setback of her strengthening due to that.  Pt has a lot of tenderness and trigger points in lumbar paraspinals and QL on right side.  Pt responded well to manual therapy  and demonstrated more left SB of lumbar region after manual techniques.      Clinical Impairments Affecting Rehab Potential  osteoporosis, history of thoracic vertebrea fracture, history of falls    PT Treatment/Interventions  ADLs/Self Care Home Management;Biofeedback;Cryotherapy;Electrical Stimulation;Moist Heat;Iontophoresis 4mg /ml Dexamethasone;Ultrasound;Gait training;Stair training;Functional  mobility training;Therapeutic activities;Therapeutic exercise;Manual techniques;Patient/family education;Neuromuscular re-education;Balance training;Dry needling;Taping;Passive range of motion    PT Next Visit Plan  posture, balance, glute strength, LE and core strength, hip and back ROM, decompression exercises, nustep    Consulted and Agree with Plan of Care  Patient       Patient will benefit from skilled therapeutic intervention in order to improve the following deficits and impairments:  Postural dysfunction, Pain, Decreased strength, Decreased activity tolerance, Difficulty walking  Visit Diagnosis: Abnormal posture  Repeated falls  Pain in thoracic spine  Muscle weakness (generalized)     Problem List Patient Active Problem List   Diagnosis Date Noted  . Coronary artery disease involving native coronary artery of native heart without angina pectoris 04/28/2017  . Syncope 04/07/2017  . Shortness of breath 04/07/2017  . Abnormal stress test 04/07/2017  . Sinus bradycardia 02/21/2017  . Coronary artery calcification 02/21/2017  . Compression fracture of thoracic vertebra ([place]) 02/08/2017  . Dizziness and giddiness 02/08/2017  . Atrial fibrillation with rapid ventricular response ([place]) 01/21/2016  . Colon polyp s/p appy/partialcecetomy 01/19/3016 01/19/2016  . Orthostatic dizziness 04/25/2014  . Vertigo 12/26/2012  . Hypertension, uncontrolled 09/11/2012  . Diastolic dysfunction 63/89/3734  . Sleep disturbance 09/17/2011  . Hypotension 12/26/2010  . Orthostatic hypotension 12/26/2010  . UTI'S, RECURRENT 11/20/2009  . ADVERSE REACTION TO MEDICATION 07/31/2009  . SCOLIOSIS 05/25/2009  . FATIGUE 05/25/2009  . DIVERTICULOSIS, COLON 02/23/2009  . COLONIC POLYPS, ADENOMATOUS, HX OF 02/23/2009  . PARATHYROIDECTOMY 01/02/2009  . NEPHROLITHIASIS, HX OF 11/15/2008  . PALPITATIONS, OCCASIONAL 07/12/2008  . Essential hypertension 02/04/2008  . Constipation 01/01/2008  . LEG  PAIN, LEFT 01/01/2008  . OSTEOPOROSIS 01/01/2008  . HYPERPARATHYROIDISM UNSPECIFIED 10/20/2007  . HYPERLIPIDEMIA 07/07/2007  . ANEMIA-NOS 07/07/2007  . URINARY INCONTINENCE 07/07/2007    Zannie Cove, PT 12/30/2017, 10:58 AM  [facility] [address] [place], [place], [address] Phone: [phone]   Fax:  [phone]  Name: [name] MRN: [id number] Date of Birth: Aug 14, 1941

## 2018-01-02 ENCOUNTER — Encounter: Payer: Self-pay | Admitting: Physical Therapy

## 2018-01-02 ENCOUNTER — Ambulatory Visit: Payer: Medicare Other | Admitting: Physical Therapy

## 2018-01-02 DIAGNOSIS — R296 Repeated falls: Secondary | ICD-10-CM

## 2018-01-02 DIAGNOSIS — M6281 Muscle weakness (generalized): Secondary | ICD-10-CM | POA: Diagnosis not present

## 2018-01-02 DIAGNOSIS — R293 Abnormal posture: Secondary | ICD-10-CM

## 2018-01-02 DIAGNOSIS — M546 Pain in thoracic spine: Secondary | ICD-10-CM | POA: Diagnosis not present

## 2018-01-02 NOTE — Therapy (Signed)
[facility] [address] [place], [place], [address] Phone: [phone]   Fax:  [phone]  Physical Therapy Treatment  Patient Details  Name: [name] MRN: [id number] Date of Birth: 1940/12/02 Referring Provider: Lanice Shirts, MD   Encounter Date: 01/02/2018  PT End of Session - 01/02/18 1042    Visit Number  7    Date for PT Re-Evaluation  01/20/18    Authorization Type  medicare    PT Start Time  1016    PT Stop Time  1058    PT Time Calculation (min)  42 min    Activity Tolerance  Patient tolerated treatment well    Behavior During Therapy  [facility] for tasks assessed/performed       Past Medical History:  Diagnosis Date  . Abdominal pain, unspecified site   . ADVERSE REACTION TO MEDICATION 07/31/2009  . ANEMIA-NOS 07/07/2007  . Arthritis   . Chest pain, unspecified   . Chronic kidney disease    hx of kidney stone  . COLONIC POLYPS, ADENOMATOUS, HX OF   . Disturbance of skin sensation   . DIVERTICULOSIS, COLON 02/23/2009  . Family history of diabetes mellitus   . Family history of malignant neoplasm of breast   . FATIGUE 05/25/2009  . Fever, unspecified   . Heart murmur    hx of  . HYPERLIPIDEMIA 07/07/2007  . HYPERPARATHYROIDISM UNSPECIFIED 10/20/2007  . HYPERTENSION 02/04/2008  . NEPHROLITHIASIS, HX OF 11/15/2008  . OSTEOPOROSIS 01/01/2008  . Other acquired absence of organ    parathyroidectomy  . Pain in limb   . PALPITATIONS, OCCASIONAL 07/12/2008  . PARATHYROIDECTOMY 01/02/2009  . SCOLIOSIS 05/25/2009  . Seasonal allergies   . Unspecified constipation   . URINARY INCONTINENCE 07/07/2007  . UTI'S, RECURRENT 11/20/2009   kimbrough     Past Surgical History:  Procedure Laterality Date  . BUNIONECTOMY    . COLONOSCOPY    . LAPAROSCOPIC APPENDECTOMY  01/19/2016   Procedure: APPENDECTOMY LAPAROSCOPIC with orifice of cecal polyp;  Surgeon: Leighton Ruff, MD;  Location: [facility];  Service:  General;;  . LEFT HEART CATH AND CORONARY ANGIOGRAPHY N/A 04/07/2017   Procedure: Left Heart Cath and Coronary Angiography;  Surgeon: Nelva Bush, MD;  Location: [place] [facility];  Service: Cardiovascular;  Laterality: N/A;  . PARATHYROIDECTOMY     Rt Superior open neck exploration  . POLYPECTOMY    . RIGHT OOPHORECTOMY    . TONSILLECTOMY      There were no vitals filed for this visit.  Subjective Assessment - 01/02/18 1020    Subjective  when I get up my leg feels fine but it starts hurting the more I walk on it.  It is bad today.  I feel a little dizzy.    Limitations  Standing;Sitting;Walking;House hold activities    Patient Stated Goals  get the leg better, relieve back pain, stand up straighter, balance    Currently in Pain?  Yes    Pain Score  6     Pain Location  Hip    Pain Orientation  Left    Pain Descriptors / Indicators  Aching    Pain Type  Chronic pain    Pain Onset  1 to 4 weeks ago    Aggravating Factors   hip internal rotation, hip flexion    Effect of Pain on Daily Activities  walking, getting     Multiple Pain Sites  No  [facility] PT Treatment/Exercise - 01/02/18 0001      Lumbar Exercises: Supine   Ab Set  20 reps with knee drops to the side    Bent Knee Raise  20 reps    Dead Bug  20 reps 1 lb UE; modified LE    Straight Leg Raise  20 reps    Large Ball Abdominal Isometric  20 reps roll out red ball; mini trunk rotation red ball    Other Supine Lumbar Exercises  leg lengthener stretch and knee out to the side keeping TrA engaged with towel folded in a square under sacrum      Knee/Hip Exercises: Stretches   Other Knee/Hip Stretches  adductor stretch - 3x 30 sec      Knee/Hip Exercises: Aerobic   Nustep  L2 x 10 min PT present to discuss treatment      Knee/Hip Exercises: Seated   Long Arc Quad  Strengthening;Right;Left;2 sets;10 reps;Weights TrA bracing    Long Arc Quad Weight  3 lbs.      Knee/Hip  Exercises: Sidelying   Hip ABduction  Left;10 reps PT assist to sabilize pelvis      Manual Therapy   Soft tissue mobilization  glutes on left in left sidelying               PT Short Term Goals - 12/30/17 0953      PT SHORT TERM GOAL #1   Title  be independent in initial HEP    Time  4    Period  Weeks    Status  Achieved      PT SHORT TERM GOAL #2   Title  reduce pain in LE when standing from chair by 30%    Time  4    Period  Weeks    Status  Achieved        PT Long Term Goals - 12/30/17 8119      PT LONG TERM GOAL #1   Title  independent with HEP and how to progress herself    Time  6    Period  Weeks    Status  On-going      PT LONG TERM GOAL #2   Title  increase Berg balance to 55/56 for reduced risk of falls    Time  8    Period  Weeks    Status  On-going      PT LONG TERM GOAL #3   Title  reduce pain to stand/walk for > or = to 30 minutes without limitation    Time  8    Period  Weeks    Status  On-going      PT LONG TERM GOAL #4   Title  demonstrate 4+/5 hip abduction for improved core and pelvic stability during ambulation    Time  8    Period  Weeks    Status  On-going      PT LONG TERM GOAL #5   Title  able to descend 4 steps without UE support due to improved balance    Baseline  no problem going up and down stairs at my house, but need a railing    Time  8    Period  Weeks    Status  On-going            Plan - 01/02/18 1104    Clinical Impression Statement  Pt was able to do the exercises without increased pain.  She has fatgue  and needed verbal and tactile cues throughout.  She needed min A to perform hip abduction correctly.  Pt has some increased pain in standing after muscles had become fatigued today.  Pt will benefit from skilled PT to work on  LE and postural strength.    Clinical Impairments Affecting Rehab Potential  osteoporosis, history of thoracic vertebrea fracture, history of falls    PT Treatment/Interventions   ADLs/Self Care Home Management;Biofeedback;Cryotherapy;Electrical Stimulation;Moist Heat;Iontophoresis 4mg /ml Dexamethasone;Ultrasound;Gait training;Stair training;Functional mobility training;Therapeutic activities;Therapeutic exercise;Manual techniques;Patient/family education;Neuromuscular re-education;Balance training;Dry needling;Taping;Passive range of motion    PT Next Visit Plan  posture, balance, glute strength, LE and core strength, hip and back ROM, decompression exercises, nustep    Consulted and Agree with Plan of Care  Patient       Patient will benefit from skilled therapeutic intervention in order to improve the following deficits and impairments:  Postural dysfunction, Pain, Decreased strength, Decreased activity tolerance, Difficulty walking  Visit Diagnosis: Abnormal posture  Repeated falls  Pain in thoracic spine  Muscle weakness (generalized)     Problem List Patient Active Problem List   Diagnosis Date Noted  . Coronary artery disease involving native coronary artery of native heart without angina pectoris 04/28/2017  . Syncope 04/07/2017  . Shortness of breath 04/07/2017  . Abnormal stress test 04/07/2017  . Sinus bradycardia 02/21/2017  . Coronary artery calcification 02/21/2017  . Compression fracture of thoracic vertebra ([place]) 02/08/2017  . Dizziness and giddiness 02/08/2017  . Atrial fibrillation with rapid ventricular response ([place]) 01/21/2016  . Colon polyp s/p appy/partialcecetomy 01/19/3016 01/19/2016  . Orthostatic dizziness 04/25/2014  . Vertigo 12/26/2012  . Hypertension, uncontrolled 09/11/2012  . Diastolic dysfunction 94/17/4081  . Sleep disturbance 09/17/2011  . Hypotension 12/26/2010  . Orthostatic hypotension 12/26/2010  . UTI'S, RECURRENT 11/20/2009  . ADVERSE REACTION TO MEDICATION 07/31/2009  . SCOLIOSIS 05/25/2009  . FATIGUE 05/25/2009  . DIVERTICULOSIS, COLON 02/23/2009  . COLONIC POLYPS, ADENOMATOUS, HX OF 02/23/2009  .  PARATHYROIDECTOMY 01/02/2009  . NEPHROLITHIASIS, HX OF 11/15/2008  . PALPITATIONS, OCCASIONAL 07/12/2008  . Essential hypertension 02/04/2008  . Constipation 01/01/2008  . LEG PAIN, LEFT 01/01/2008  . OSTEOPOROSIS 01/01/2008  . HYPERPARATHYROIDISM UNSPECIFIED 10/20/2007  . HYPERLIPIDEMIA 07/07/2007  . ANEMIA-NOS 07/07/2007  . URINARY INCONTINENCE 07/07/2007    Zannie Cove, PT 01/02/2018, 11:59 AM  [facility] [address] [place], [place], [address] Phone: [phone]   Fax:  [phone]  Name: [name] MRN: [id number] Date of Birth: January 26, 1941

## 2018-01-06 ENCOUNTER — Encounter: Payer: Medicare Other | Admitting: Physical Therapy

## 2018-01-09 ENCOUNTER — Ambulatory Visit: Payer: Medicare Other | Admitting: Physical Therapy

## 2018-01-09 DIAGNOSIS — R293 Abnormal posture: Secondary | ICD-10-CM | POA: Diagnosis not present

## 2018-01-09 DIAGNOSIS — R296 Repeated falls: Secondary | ICD-10-CM

## 2018-01-09 DIAGNOSIS — M546 Pain in thoracic spine: Secondary | ICD-10-CM | POA: Diagnosis not present

## 2018-01-09 DIAGNOSIS — M6281 Muscle weakness (generalized): Secondary | ICD-10-CM | POA: Diagnosis not present

## 2018-01-09 NOTE — Therapy (Signed)
[facility] [address], [place], [place], [address] Phone: [phone]   Fax:  [phone]  Physical Therapy Treatment  Patient Details  Name: [name] MRN: [id number] Date of Birth: Sep 25, 1941 Referring Provider: Lanice Shirts, MD   Encounter Date: 01/09/2018  PT End of Session - 01/09/18 1018    Visit Number  8    Date for PT Re-Evaluation  01/20/18    Authorization Type  medicare    PT Start Time  0935    PT Stop Time  1016    PT Time Calculation (min)  41 min       Past Medical History:  Diagnosis Date  . Abdominal pain, unspecified site   . ADVERSE REACTION TO MEDICATION 07/31/2009  . ANEMIA-NOS 07/07/2007  . Arthritis   . Chest pain, unspecified   . Chronic kidney disease    hx of kidney stone  . COLONIC POLYPS, ADENOMATOUS, HX OF   . Disturbance of skin sensation   . DIVERTICULOSIS, COLON 02/23/2009  . Family history of diabetes mellitus   . Family history of malignant neoplasm of breast   . FATIGUE 05/25/2009  . Fever, unspecified   . Heart murmur    hx of  . HYPERLIPIDEMIA 07/07/2007  . HYPERPARATHYROIDISM UNSPECIFIED 10/20/2007  . HYPERTENSION 02/04/2008  . NEPHROLITHIASIS, HX OF 11/15/2008  . OSTEOPOROSIS 01/01/2008  . Other acquired absence of organ    parathyroidectomy  . Pain in limb   . PALPITATIONS, OCCASIONAL 07/12/2008  . PARATHYROIDECTOMY 01/02/2009  . SCOLIOSIS 05/25/2009  . Seasonal allergies   . Unspecified constipation   . URINARY INCONTINENCE 07/07/2007  . UTI'S, RECURRENT 11/20/2009   kimbrough     Past Surgical History:  Procedure Laterality Date  . BUNIONECTOMY    . COLONOSCOPY    . LAPAROSCOPIC APPENDECTOMY  01/19/2016   Procedure: APPENDECTOMY LAPAROSCOPIC with orifice of cecal polyp;  Surgeon: Leighton Ruff, MD;  Location: [facility];  Service: General;;  . LEFT HEART CATH AND CORONARY ANGIOGRAPHY N/A 04/07/2017   Procedure: Left Heart Cath and Coronary Angiography;   Surgeon: Nelva Bush, MD;  Location: [place] [facility];  Service: Cardiovascular;  Laterality: N/A;  . PARATHYROIDECTOMY     Rt Superior open neck exploration  . POLYPECTOMY    . RIGHT OOPHORECTOMY    . TONSILLECTOMY      There were no vitals filed for this visit.                             PT Short Term Goals - 12/30/17 0953      PT SHORT TERM GOAL #1   Title  be independent in initial HEP    Time  4    Period  Weeks    Status  Achieved      PT SHORT TERM GOAL #2   Title  reduce pain in LE when standing from chair by 30%    Time  4    Period  Weeks    Status  Achieved        PT Long Term Goals - 12/30/17 5009      PT LONG TERM GOAL #1   Title  independent with HEP and how to progress herself    Time  6    Period  Weeks    Status  On-going      PT LONG TERM GOAL #2   Title  increase  Berg balance to 55/56 for reduced risk of falls    Time  8    Period  Weeks    Status  On-going      PT LONG TERM GOAL #3   Title  reduce pain to stand/walk for > or = to 30 minutes without limitation    Time  8    Period  Weeks    Status  On-going      PT LONG TERM GOAL #4   Title  demonstrate 4+/5 hip abduction for improved core and pelvic stability during ambulation    Time  8    Period  Weeks    Status  On-going      PT LONG TERM GOAL #5   Title  able to descend 4 steps without UE support due to improved balance    Baseline  no problem going up and down stairs at my house, but need a railing    Time  8    Period  Weeks    Status  On-going              Patient will benefit from skilled therapeutic intervention in order to improve the following deficits and impairments:     Visit Diagnosis: Abnormal posture  Repeated falls  Pain in thoracic spine     Problem List Patient Active Problem List   Diagnosis Date Noted  . Coronary artery disease involving native coronary artery of native heart without angina pectoris  04/28/2017  . Syncope 04/07/2017  . Shortness of breath 04/07/2017  . Abnormal stress test 04/07/2017  . Sinus bradycardia 02/21/2017  . Coronary artery calcification 02/21/2017  . Compression fracture of thoracic vertebra ([place]) 02/08/2017  . Dizziness and giddiness 02/08/2017  . Atrial fibrillation with rapid ventricular response ([place]) 01/21/2016  . Colon polyp s/p appy/partialcecetomy 01/19/3016 01/19/2016  . Orthostatic dizziness 04/25/2014  . Vertigo 12/26/2012  . Hypertension, uncontrolled 09/11/2012  . Diastolic dysfunction 61/53/7943  . Sleep disturbance 09/17/2011  . Hypotension 12/26/2010  . Orthostatic hypotension 12/26/2010  . UTI'S, RECURRENT 11/20/2009  . ADVERSE REACTION TO MEDICATION 07/31/2009  . SCOLIOSIS 05/25/2009  . FATIGUE 05/25/2009  . DIVERTICULOSIS, COLON 02/23/2009  . COLONIC POLYPS, ADENOMATOUS, HX OF 02/23/2009  . PARATHYROIDECTOMY 01/02/2009  . NEPHROLITHIASIS, HX OF 11/15/2008  . PALPITATIONS, OCCASIONAL 07/12/2008  . Essential hypertension 02/04/2008  . Constipation 01/01/2008  . LEG PAIN, LEFT 01/01/2008  . OSTEOPOROSIS 01/01/2008  . HYPERPARATHYROIDISM UNSPECIFIED 10/20/2007  . HYPERLIPIDEMIA 07/07/2007  . ANEMIA-NOS 07/07/2007  . URINARY INCONTINENCE 07/07/2007    Zannie Cove 01/09/2018, 10:32 AM  [facility] [address] [place], [place], [address] Phone: [phone]   Fax:  [phone]  Name: [name] MRN: [id number] Date of Birth: 11-10-1941

## 2018-01-09 NOTE — Patient Instructions (Signed)
Adduction: Hip - Knees Together (Hook-Lying)    Lie with hips and knees bent, towel roll between knees. Push knees together. Hold for _5__ seconds. Rest for _2__ seconds. Repeat _10__ times. Do _2__ times a day.   Copyright  VHI. All rights reserved.  Hip Abduction / Adduction: with Knee Flexion (Supine)    With both knees bent (different than above picture), gently lower knee to side and return. Repeat with opposite leg.  Keep core engaged Repeat __10__ times per set. Do __2__ sets per session. Do _2___ sessions per day.  http://orth.exer.us/683   Copyright  VHI. All rights reserved.  Hip Abduction / Adduction: with Extended Knee (Supine)    Bring left leg out to side and return. Keep knee straight.  Keep core engaged Repeat ___10_ times per set. Do __1__ sets per session. Do _2___ sessions per day.  http://orth.exer.us/681   Copyright  VHI. All rights reserved.  Leg Lengthener: Full    Straighten left leg. Pull toes AND forefoot toward knee, extend heel. Lengthen leg by pulling pelvis away from ribs. Hold _5__ seconds. Relax. Repeat 1 time. Re-bend knee. Do other leg. Each leg _10__ times. Surface: floor   Copyright  VHI. All rights reserved.  Abduction: Clam (Eccentric) - Side-Lying    Lie on side with knees bent. Lift top knee, keeping feet together. Keep trunk steady. Slowly lower for 3-5 seconds. _10__ reps per set, _2__ sets per day, ___ days per week. Add ___ lbs when you achieve ___ repetitions.  http://ecce.exer.us/65   Copyright  VHI. All rights reserved.     BALL SQUEEZE - SEATED  While sitting upright with feet flat on the floor, place a ball between your knees. Squeeze the ball with your knees and hold, brace your core as you squeeze.  Hold 5 seconds, rest 2.  Repeat 10x, do 2x/day    TANDEM WALK  Take steps so that your heel strikes the ground and is touching the toes of the other foot.  Continue taking steps as you walk forward.    Maintain your balance.     Side Step  SIDE STEP  Find a place in your home where you can hold onto something, such as a kitchen counter. Have a chair nearby if you need to take a break. Stand facing the counter. Take one step out to the side, then step with the other foot to bring your feet together. Continue side stepping 10-12 ft. Do without holding anything to one end of the counter and back 3 x.  2x/day    One foot in front of the other and up tall looking straight ahead.  Turn head side to side.  Switch feet and do one minute each way.     [place] [address], [place], [place] [address] Phone # [phone] Fax [phone]

## 2018-01-09 NOTE — Therapy (Addendum)
[facility] [address] [place], [place], [address] Phone: [phone]   Fax:  [phone]  Physical Therapy Treatment  Patient Details  Name: [name] MRN: [id number] Date of Birth: 1941/01/18 Referring Provider: Lanice Shirts, MD   Encounter Date: 01/09/2018  PT End of Session - 01/09/18 1018    Visit Number  8    Date for PT Re-Evaluation  01/20/18    Authorization Type  medicare    PT Start Time  0935    PT Stop Time  1016    PT Time Calculation (min)  41 min       Past Medical History:  Diagnosis Date  . Abdominal pain, unspecified site   . ADVERSE REACTION TO MEDICATION 07/31/2009  . ANEMIA-NOS 07/07/2007  . Arthritis   . Chest pain, unspecified   . Chronic kidney disease    hx of kidney stone  . COLONIC POLYPS, ADENOMATOUS, HX OF   . Disturbance of skin sensation   . DIVERTICULOSIS, COLON 02/23/2009  . Family history of diabetes mellitus   . Family history of malignant neoplasm of breast   . FATIGUE 05/25/2009  . Fever, unspecified   . Heart murmur    hx of  . HYPERLIPIDEMIA 07/07/2007  . HYPERPARATHYROIDISM UNSPECIFIED 10/20/2007  . HYPERTENSION 02/04/2008  . NEPHROLITHIASIS, HX OF 11/15/2008  . OSTEOPOROSIS 01/01/2008  . Other acquired absence of organ    parathyroidectomy  . Pain in limb   . PALPITATIONS, OCCASIONAL 07/12/2008  . PARATHYROIDECTOMY 01/02/2009  . SCOLIOSIS 05/25/2009  . Seasonal allergies   . Unspecified constipation   . URINARY INCONTINENCE 07/07/2007  . UTI'S, RECURRENT 11/20/2009   kimbrough     Past Surgical History:  Procedure Laterality Date  . BUNIONECTOMY    . COLONOSCOPY    . LAPAROSCOPIC APPENDECTOMY  01/19/2016   Procedure: APPENDECTOMY LAPAROSCOPIC with orifice of cecal polyp;  Surgeon: Leighton Ruff, MD;  Location: [facility];  Service: General;;  . LEFT HEART CATH AND CORONARY ANGIOGRAPHY N/A 04/07/2017   Procedure: Left Heart Cath and Coronary Angiography;   Surgeon: Nelva Bush, MD;  Location: [facility];  Service: Cardiovascular;  Laterality: N/A;  . PARATHYROIDECTOMY     Rt Superior open neck exploration  . POLYPECTOMY    . RIGHT OOPHORECTOMY    . TONSILLECTOMY      There were no vitals filed for this visit.  Subjective Assessment - 01/09/18 1521    Subjective  I am feeling a lot better.  I want to have a routine to do at home.  Pt reports she has not been walking as much due to feeling off balance.  She states it feels like weakness that has increased since being sick.    Limitations  Standing;Sitting;Walking;House hold activities    Patient Stated Goals  get the leg better, relieve back pain, stand up straighter, balance    Currently in Pain?  No/denies                      [facility] Adult PT Treatment/Exercise - 01/09/18 0001      Neuro Re-ed    Neuro Re-ed Details   tandem standing with head turns, tandem walking with close supervision, side stepping      Lumbar Exercises: Supine   Ab Set  20 reps with knee drops to the side 20x; ball squeeze 20x    Bent Knee Raise  20 reps    Straight  Leg Raise  10 reps bilateral with PT min assist to brace    Other Supine Lumbar Exercises  leg lengthener stretch and knee out to the side keeping TrA engaged with towel folded in a square under sacrum      Knee/Hip Exercises: Aerobic   Nustep  L2 x 10 min PT present to discuss treatment      Knee/Hip Exercises: Seated   Abduction/Adduction   Strengthening;Both;20 reps ball squeeze and core contraction    Sit to Sand  10 reps;without UE support      Knee/Hip Exercises: Supine   Other Supine Knee/Hip Exercises  hip abduction - 10x each side               PT Short Term Goals - 12/30/17 0953      PT SHORT TERM GOAL #1   Title  be independent in initial HEP    Time  4    Period  Weeks    Status  Achieved      PT SHORT TERM GOAL #2   Title  reduce pain in LE when standing from chair by 30%    Time  4     Period  Weeks    Status  Achieved        PT Long Term Goals - 12/30/17 1696      PT LONG TERM GOAL #1   Title  independent with HEP and how to progress herself    Time  6    Period  Weeks    Status  On-going      PT LONG TERM GOAL #2   Title  increase Berg balance to 55/56 for reduced risk of falls    Time  8    Period  Weeks    Status  On-going      PT LONG TERM GOAL #3   Title  reduce pain to stand/walk for > or = to 30 minutes without limitation    Time  8    Period  Weeks    Status  On-going      PT LONG TERM GOAL #4   Title  demonstrate 4+/5 hip abduction for improved core and pelvic stability during ambulation    Time  8    Period  Weeks    Status  On-going      PT LONG TERM GOAL #5   Title  able to descend 4 steps without UE support due to improved balance    Baseline  no problem going up and down stairs at my house, but need a railing    Time  8    Period  Weeks    Status  On-going            Plan - 01/09/18 1523    Clinical Impression Statement  Pt demonstrates core weakness and was able to perform exercise more easily with min assist from PT to brace and stabilize pelvis.  Pt showed improved movment after learning to engage her core.  Pt is still demonstrating more weakness since her recent set back of being sick and unable to get outof bed.  At this time, patient will benefit from skilled PT to continue working on function strength, balance, and gait.     Clinical Impairments Affecting Rehab Potential  osteoporosis, history of thoracic vertebrea fracture, history of falls    PT Treatment/Interventions  ADLs/Self Care Home Management;Biofeedback;Cryotherapy;Electrical Stimulation;Moist Heat;Iontophoresis 15m/ml Dexamethasone;Ultrasound;Gait training;Stair training;Functional mobility training;Therapeutic activities;Therapeutic exercise;Manual techniques;Patient/family education;Neuromuscular re-education;Balance training;Dry  needling;Taping;Passive range of  motion    PT Next Visit Plan  posture, balance, glute strength, LE and core strength, hip and back ROM, decompression exercises, nustep    Consulted and Agree with Plan of Care  Patient       Patient will benefit from skilled therapeutic intervention in order to improve the following deficits and impairments:  Postural dysfunction, Pain, Decreased strength, Decreased activity tolerance, Difficulty walking  Visit Diagnosis: Abnormal posture  Repeated falls  Pain in thoracic spine     Problem List Patient Active Problem List   Diagnosis Date Noted  . Coronary artery disease involving native coronary artery of native heart without angina pectoris 04/28/2017  . Syncope 04/07/2017  . Shortness of breath 04/07/2017  . Abnormal stress test 04/07/2017  . Sinus bradycardia 02/21/2017  . Coronary artery calcification 02/21/2017  . Compression fracture of thoracic vertebra ([place]) 02/08/2017  . Dizziness and giddiness 02/08/2017  . Atrial fibrillation with rapid ventricular response ([place]) 01/21/2016  . Colon polyp s/p appy/partialcecetomy 01/19/3016 01/19/2016  . Orthostatic dizziness 04/25/2014  . Vertigo 12/26/2012  . Hypertension, uncontrolled 09/11/2012  . Diastolic dysfunction 51/12/5850  . Sleep disturbance 09/17/2011  . Hypotension 12/26/2010  . Orthostatic hypotension 12/26/2010  . UTI'S, RECURRENT 11/20/2009  . ADVERSE REACTION TO MEDICATION 07/31/2009  . SCOLIOSIS 05/25/2009  . FATIGUE 05/25/2009  . DIVERTICULOSIS, COLON 02/23/2009  . COLONIC POLYPS, ADENOMATOUS, HX OF 02/23/2009  . PARATHYROIDECTOMY 01/02/2009  . NEPHROLITHIASIS, HX OF 11/15/2008  . PALPITATIONS, OCCASIONAL 07/12/2008  . Essential hypertension 02/04/2008  . Constipation 01/01/2008  . LEG PAIN, LEFT 01/01/2008  . OSTEOPOROSIS 01/01/2008  . HYPERPARATHYROIDISM UNSPECIFIED 10/20/2007  . HYPERLIPIDEMIA 07/07/2007  . ANEMIA-NOS 07/07/2007  . URINARY INCONTINENCE 07/07/2007    Zannie Cove 01/09/2018,  3:30 PM  [facility] [address], [place], [place], [address] Phone: [phone]   Fax:  [phone]  Name: [name] MRN: [id number] Date of Birth: January 01, 1941  PHYSICAL THERAPY DISCHARGE SUMMARY  Visits from Start of Care: 8  Current functional level related to goals / functional outcomes: See above details   Remaining deficits: See above details   Education / Equipment: HEP  Plan: Patient agrees to discharge.  Patient goals were not met. Patient is being discharged due to a change in medical status.  ?????    Pt is going to have hip surgery  Zannie Cove, PT 01/19/18 8:45 AM

## 2018-01-12 ENCOUNTER — Encounter: Payer: Medicare Other | Admitting: Physical Therapy

## 2018-01-13 ENCOUNTER — Encounter: Payer: Medicare Other | Admitting: Physical Therapy

## 2018-01-16 ENCOUNTER — Encounter: Payer: Medicare Other | Admitting: Physical Therapy

## 2018-01-16 DIAGNOSIS — M1612 Unilateral primary osteoarthritis, left hip: Secondary | ICD-10-CM | POA: Diagnosis not present

## 2018-01-16 DIAGNOSIS — M545 Low back pain: Secondary | ICD-10-CM | POA: Diagnosis not present

## 2018-01-16 DIAGNOSIS — M810 Age-related osteoporosis without current pathological fracture: Secondary | ICD-10-CM | POA: Diagnosis not present

## 2018-01-16 DIAGNOSIS — S22080D Wedge compression fracture of T11-T12 vertebra, subsequent encounter for fracture with routine healing: Secondary | ICD-10-CM | POA: Diagnosis not present

## 2018-01-19 ENCOUNTER — Ambulatory Visit: Payer: Medicare Other | Admitting: Physical Therapy

## 2018-01-20 ENCOUNTER — Encounter: Payer: Medicare Other | Admitting: Physical Therapy

## 2018-02-06 DIAGNOSIS — M1612 Unilateral primary osteoarthritis, left hip: Secondary | ICD-10-CM | POA: Diagnosis not present

## 2018-02-12 ENCOUNTER — Other Ambulatory Visit: Payer: Self-pay | Admitting: Cardiology

## 2018-02-17 DIAGNOSIS — I2510 Atherosclerotic heart disease of native coronary artery without angina pectoris: Secondary | ICD-10-CM | POA: Diagnosis not present

## 2018-02-17 DIAGNOSIS — I10 Essential (primary) hypertension: Secondary | ICD-10-CM | POA: Diagnosis not present

## 2018-02-17 DIAGNOSIS — F320 Major depressive disorder, single episode, mild: Secondary | ICD-10-CM | POA: Diagnosis not present

## 2018-02-17 DIAGNOSIS — I480 Paroxysmal atrial fibrillation: Secondary | ICD-10-CM | POA: Diagnosis not present

## 2018-02-17 DIAGNOSIS — M810 Age-related osteoporosis without current pathological fracture: Secondary | ICD-10-CM | POA: Diagnosis not present

## 2018-02-20 DIAGNOSIS — M4854XA Collapsed vertebra, not elsewhere classified, thoracic region, initial encounter for fracture: Secondary | ICD-10-CM | POA: Diagnosis not present

## 2018-02-20 DIAGNOSIS — F320 Major depressive disorder, single episode, mild: Secondary | ICD-10-CM | POA: Diagnosis not present

## 2018-02-20 DIAGNOSIS — M25552 Pain in left hip: Secondary | ICD-10-CM | POA: Diagnosis not present

## 2018-02-20 DIAGNOSIS — M810 Age-related osteoporosis without current pathological fracture: Secondary | ICD-10-CM | POA: Diagnosis not present

## 2018-02-27 ENCOUNTER — Other Ambulatory Visit: Payer: Medicare Other

## 2018-02-27 ENCOUNTER — Encounter (INDEPENDENT_AMBULATORY_CARE_PROVIDER_SITE_OTHER): Payer: Self-pay

## 2018-02-27 ENCOUNTER — Ambulatory Visit (INDEPENDENT_AMBULATORY_CARE_PROVIDER_SITE_OTHER): Payer: Medicare Other | Admitting: Gastroenterology

## 2018-02-27 ENCOUNTER — Encounter: Payer: Self-pay | Admitting: Gastroenterology

## 2018-02-27 VITALS — BP 132/80 | HR 68 | Ht 60.5 in | Wt 110.2 lb

## 2018-02-27 DIAGNOSIS — K8689 Other specified diseases of pancreas: Secondary | ICD-10-CM | POA: Diagnosis not present

## 2018-02-27 DIAGNOSIS — R634 Abnormal weight loss: Secondary | ICD-10-CM | POA: Diagnosis not present

## 2018-02-27 DIAGNOSIS — R197 Diarrhea, unspecified: Secondary | ICD-10-CM

## 2018-02-27 DIAGNOSIS — K589 Irritable bowel syndrome without diarrhea: Secondary | ICD-10-CM | POA: Diagnosis not present

## 2018-02-27 MED ORDER — COLESTIPOL HCL 1 G PO TABS: 1.0000 g | ORAL_TABLET | Freq: Every day | ORAL | 3 refills | Status: DC

## 2018-02-27 NOTE — Progress Notes (Signed)
[name]    [id number]    02/13/1941  Primary Care Physician:Schoenhoff, Altamese Cabal, MD  Referring Physician: Lanice Shirts, MD [address] [place], [place] [address]  Chief complaint: Diarrhea HPI: 77 year old female with history of chronic irritable bowel syndrome with alternating constipation and diarrhea is here for follow-up visit.  He was last seen in office December 25, 2017.  Patient states since then she has been having more pronounced diarrhea with daily 4-5 bowel movements.  On some days the stool is formed but other times it is more semi-formed to liquid.  She recently started drinking vitamin water with artificial sweetener 60 ounces daily after the office visit in February 2019, has stopped it in the past few weeks as she noticed diarrhea was getting worse with it.  She is taking magnesium 400 mg twice daily to improve bone health per patient.  Denies any recent travel antibiotic use.  Did not eat any uncooked meat or seafood.  She has lost 10 pounds in the past few months and has lost about 25 lbs in the past 2 years.  Denies any  vomiting, dysphagia, abdominal pain, melena or blood per rectum. Is currently not on any laxatives, never started taking Amitiza.  Outpatient Encounter Medications as of 02/27/2018  Medication Sig  . aspirin 81 MG tablet Take 81 mg by mouth every evening.   [name] atorvastatin (LIPITOR) 20 MG tablet TAKE 1 TABLET EACH DAY. (Patient taking differently: TAKE 1 TABLET EACH DAY IN THE EVENING)  . Cholecalciferol (VITAMIN D3) 5000 units CAPS Take 1 capsule by mouth daily.  [name] denosumab (PROLIA) 60 MG/ML SOLN injection Inject 60 mg into the skin every 6 (six) months. Administer in upper arm, thigh, or abdomen  . diphenhydramine-acetaminophen (TYLENOL PM) 25-500 MG TABS tablet Take 0.5 tablets by mouth at bedtime as needed (SLEEP).  [name] losartan (COZAAR) 25 MG tablet TAKE 1 TABLET ONCE DAILY.  . Magnesium 400 MG TABS Take 1 tablet  by mouth daily. For I week  . Multiple Vitamins-Minerals (MULTIVITAMIN ADULT PO) Take 1 capsule by mouth daily. Bone Up-multivitamin with calcium  . colestipol (COLESTID) 1 g tablet Take 1 tablet (1 g total) by mouth daily. At bedtime  . [DISCONTINUED] amoxicillin (AMOXIL) 500 MG capsule Take 2 capsules (1,000 mg total) by mouth 2 (two) times daily.   No facility-administered encounter medications on file as of 02/27/2018.     Allergies as of 02/27/2018 - Review Complete 02/27/2018  Allergen Reaction Noted  . Anesthetics, amide Other (See Comments) 02/20/2016  . Lisinopril Cough 10/05/2009    Past Medical History:  Diagnosis Date  . Abdominal pain, unspecified site   . ADVERSE REACTION TO MEDICATION 07/31/2009  . ANEMIA-NOS 07/07/2007  . Arthritis   . Chest pain, unspecified   . Chronic kidney disease    hx of kidney stone  . COLONIC POLYPS, ADENOMATOUS, HX OF   . Disturbance of skin sensation   . DIVERTICULOSIS, COLON 02/23/2009  . Family history of diabetes mellitus   . Family history of malignant neoplasm of breast   . FATIGUE 05/25/2009  . Fever, unspecified   . Heart murmur    hx of  . HYPERLIPIDEMIA 07/07/2007  . HYPERPARATHYROIDISM UNSPECIFIED 10/20/2007  . HYPERTENSION 02/04/2008  . NEPHROLITHIASIS, HX OF 11/15/2008  . OSTEOPOROSIS 01/01/2008  . Other acquired absence of organ    parathyroidectomy  . Pain in limb   . PALPITATIONS,  OCCASIONAL 07/12/2008  . PARATHYROIDECTOMY 01/02/2009  . SCOLIOSIS 05/25/2009  . Seasonal allergies   . Unspecified constipation   . URINARY INCONTINENCE 07/07/2007  . UTI'S, RECURRENT 11/20/2009   kimbrough     Past Surgical History:  Procedure Laterality Date  . BUNIONECTOMY    . COLONOSCOPY    . LAPAROSCOPIC APPENDECTOMY  01/19/2016   Procedure: APPENDECTOMY LAPAROSCOPIC with orifice of cecal polyp;  Surgeon: Leighton Ruff, MD;  Location: [facility];  Service: General;;  . LEFT HEART CATH AND CORONARY ANGIOGRAPHY N/A 04/07/2017   Procedure: Left  Heart Cath and Coronary Angiography;  Surgeon: Nelva Bush, MD;  Location: [facility];  Service: Cardiovascular;  Laterality: N/A;  . PARATHYROIDECTOMY     Rt Superior open neck exploration  . POLYPECTOMY    . RIGHT OOPHORECTOMY    . TONSILLECTOMY      Family History  Problem Relation Age of Onset  . Sudden death Father 70  . Heart disease Father 37       MI  . Heart disease Mother        valve replacement  . Diabetes Unknown        1st degree relative  . Breast cancer Paternal Aunt   . Colon cancer Neg Hx   . Stomach cancer Neg Hx     Social History   Socioeconomic History  . Marital status: Married    Spouse name: Not on file  . Number of children: 3  . Years of education: Not on file  . Highest education level: Not on file  Occupational History  . Occupation: travel Primary school teacher: RETIRED  Social Needs  . Financial resource strain: Not on file  . Food insecurity:    Worry: Not on file    Inability: Not on file  . Transportation needs:    Medical: Not on file    Non-medical: Not on file  Tobacco Use  . Smoking status: Never Smoker  . Smokeless tobacco: Never Used  Substance and Sexual Activity  . Alcohol use: Yes    Comment: 1 bottle wine week  . Drug use: No  . Sexual activity: Never  Lifestyle  . Physical activity:    Days per week: Not on file    Minutes per session: Not on file  . Stress: Not on file  Relationships  . Social connections:    Talks on phone: Not on file    Gets together: Not on file    Attends religious service: Not on file    Active member of club or organization: Not on file    Attends meetings of clubs or organizations: Not on file    Relationship status: Not on file  . Intimate partner violence:    Fear of current or ex partner: Not on file    Emotionally abused: Not on file    Physically abused: Not on file    Forced sexual activity: Not on file  Other Topics Concern  . Not on file  Social History  Narrative   HH of 5    Kids at home now   Married   travel agent.    2-3 c coffee in am    ocass wine    G3P2vaginal delivery   Pt cell [phone]               Review of systems: Review of Systems  Constitutional: Negative for fever and chills.  HENT: Negative.   Eyes: Negative  for blurred vision.  Respiratory: Negative for cough, shortness of breath and wheezing.   Cardiovascular: Negative for chest pain and palpitations.  Gastrointestinal: as per HPI Genitourinary: Negative for dysuria, urgency, frequency and hematuria.  Musculoskeletal: Negative for myalgias, back pain and joint pain.  Skin: Negative for itching and rash.  Neurological: Negative for dizziness, tremors, focal weakness, seizures and loss of consciousness.  Endo/Heme/Allergies: Negative for seasonal allergies.  Psychiatric/Behavioral: Negative for depression, suicidal ideas and hallucinations.  All other systems reviewed and are negative.   Physical Exam: Vitals:   02/27/18 1016  BP: 132/80  Pulse: 68   Body mass index is 21.18 kg/m. Gen:      No acute distress HEENT:  EOMI, sclera anicteric Neck:     No masses; no thyromegaly Lungs:    Clear to auscultation bilaterally; normal respiratory effort CV:         Regular rate and rhythm; no murmurs Abd:      + bowel sounds; soft, non-tender; no palpable masses, no distension Ext:    No edema; adequate peripheral perfusion Skin:      Warm and dry; no rash Neuro: alert and oriented x 3 Psych: normal mood and affect  Data Reviewed:  Reviewed labs, radiology imaging, old records and pertinent past GI work up   Assessment and Plan/Recommendations:  77 year old female with history of chronic irritable bowel syndrome with alternating constipation and diarrhea here with complaints of predominant diarrhea for the past 2 months She is status post extended appendectomy for residual polypoid tissue of large tubulovillous adenoma extending onto the appendiceal  orifice March 2017 We will check GI pathogen panel to exclude infectious etiology and C. Difficile Start Colestid 1 g daily at bedtime Advised patient to decrease magnesium to 400 mg daily Avoid drinking juice or sports drinks with high fructose corn syrup or artificial sweeteners  Continuing lactose-free diet Will also check fecal fat and pancreatic elastase to exclude pancreatic insufficiency Trial of pancreatic enzyme Zenpep 40,000 units 3 times daily with meals for 4 days Advised patient to call back in 1 week to report any change of symptoms If continues to have persistent diarrhea, will consider colonoscopy with biopsies for further evaluation and exclude microscopic colitis Return in 1 month or sooner if needed  Greater than 50% of the time used for counseling as well as treatment plan and follow-up. She had multiple questions which were answered to her satisfaction  K. Denzil Magnuson , MD [phone]    CC: Coralyn Mark Altamese Cabal, *

## 2018-02-27 NOTE — Patient Instructions (Addendum)
Normal BMI (Body Mass Index- based on height and weight) is between 23 and 30. Your BMI today is Body mass index is 21.18 kg/m. [name] Please consider follow up  regarding your BMI with your Primary Care Provider.  Please stop artifical sweeteners  We have sent the following prescriptions to your mail in pharmacy:Colestid 1gm daily  at bedtime with 3 refills  Decrease Magnesium to once daily for 1 week.  Take Zenpep tabs with each meal for 4 days  If no improvement in 2 weeks call our office.  Your provider has requested that you go to the basement level for lab work before leaving today. Press "B" on the elevator. The lab is located at the first door on the left as you exit the elevator  Follow up with [name] on 03/23/18

## 2018-03-04 ENCOUNTER — Telehealth: Payer: Self-pay | Admitting: Gastroenterology

## 2018-03-04 NOTE — Telephone Encounter (Signed)
Spoke with the patient. She states not bowel movement in 5 days. Last dose of Zenpep was last night. Still not bowel movement today. I have asked her not to take the Colestipol until I have talked with you.

## 2018-03-04 NOTE — Telephone Encounter (Signed)
Patient advised. Samples at the front desk for pickup. Zenpep 40,000.

## 2018-03-04 NOTE — Telephone Encounter (Signed)
She can go back on twice daily magnesium. Decrease Zenpep to 1 capsule TID. Hold Colestid for now. Thanks

## 2018-03-05 ENCOUNTER — Telehealth: Payer: Self-pay | Admitting: Gastroenterology

## 2018-03-05 NOTE — Telephone Encounter (Signed)
Patient is now constipated and uncomfortable. Discussed simple and gentle ways to encourage a bowel movement. She will try a glycerin suppository. She will consider a saline enema. She will avoid stimlant laxatives.

## 2018-03-07 LAB — PANCREATIC ELASTASE, FECAL: Pancreatic Elastase-1, Stool: 199 mcg/g — ABNORMAL LOW

## 2018-03-07 LAB — GASTROINTESTINAL PATHOGEN PANEL PCR
C. difficile Tox A/B, PCR: NOT DETECTED
Campylobacter, PCR: NOT DETECTED
Cryptosporidium, PCR: NOT DETECTED
E coli (ETEC) LT/ST PCR: NOT DETECTED
E coli (STEC) stx1/stx2, PCR: NOT DETECTED
E coli 0157, PCR: NOT DETECTED
Giardia lamblia, PCR: NOT DETECTED
Norovirus, PCR: NOT DETECTED
Rotavirus A, PCR: NOT DETECTED
Salmonella, PCR: NOT DETECTED
Shigella, PCR: NOT DETECTED

## 2018-03-07 LAB — FECAL FAT, QUALITATIVE: FECAL FAT, QUALITATIVE: NORMAL

## 2018-03-19 ENCOUNTER — Telehealth: Payer: Self-pay

## 2018-03-19 ENCOUNTER — Other Ambulatory Visit: Payer: Self-pay

## 2018-03-19 MED ORDER — PANCRELIPASE (LIP-PROT-AMYL) 40000-126000 UNITS PO CPEP: 1.0000 | ORAL_CAPSULE | Freq: Three times a day (TID) | ORAL | 0 refills | Status: DC

## 2018-03-19 NOTE — Telephone Encounter (Signed)
Advised of the results and recommendations. She states she does not normally take Zenpep with every meal as she feels it is causing constipation. She has a follow up appointment and will discuss this further at that time.

## 2018-03-19 NOTE — Telephone Encounter (Signed)
-----   Message from Mauri Pole, MD sent at 03/17/2018 12:59 PM EDT ----- GI pathogen panel is negative.  Fecal fat qualitative is normal but fecal pancreatic elastase is borderline low suggestive of moderate pancreatic insufficiency.  Please advise patient to continue Zenpep 1 capsule with each meal and snack

## 2018-03-23 ENCOUNTER — Encounter: Payer: Self-pay | Admitting: Physician Assistant

## 2018-03-23 ENCOUNTER — Other Ambulatory Visit (INDEPENDENT_AMBULATORY_CARE_PROVIDER_SITE_OTHER): Payer: Medicare Other

## 2018-03-23 ENCOUNTER — Other Ambulatory Visit: Payer: Medicare Other

## 2018-03-23 ENCOUNTER — Ambulatory Visit (INDEPENDENT_AMBULATORY_CARE_PROVIDER_SITE_OTHER): Payer: Medicare Other | Admitting: Physician Assistant

## 2018-03-23 VITALS — BP 120/62 | HR 74 | Ht 60.0 in | Wt 113.0 lb

## 2018-03-23 DIAGNOSIS — R110 Nausea: Secondary | ICD-10-CM

## 2018-03-23 DIAGNOSIS — R197 Diarrhea, unspecified: Secondary | ICD-10-CM

## 2018-03-23 DIAGNOSIS — D126 Benign neoplasm of colon, unspecified: Secondary | ICD-10-CM

## 2018-03-23 DIAGNOSIS — R159 Full incontinence of feces: Secondary | ICD-10-CM

## 2018-03-23 DIAGNOSIS — Z79899 Other long term (current) drug therapy: Secondary | ICD-10-CM | POA: Diagnosis not present

## 2018-03-23 DIAGNOSIS — M810 Age-related osteoporosis without current pathological fracture: Secondary | ICD-10-CM | POA: Diagnosis not present

## 2018-03-23 DIAGNOSIS — R768 Other specified abnormal immunological findings in serum: Secondary | ICD-10-CM | POA: Diagnosis not present

## 2018-03-23 DIAGNOSIS — Z6820 Body mass index (BMI) 20.0-20.9, adult: Secondary | ICD-10-CM | POA: Diagnosis not present

## 2018-03-23 DIAGNOSIS — M4854XA Collapsed vertebra, not elsewhere classified, thoracic region, initial encounter for fracture: Secondary | ICD-10-CM | POA: Diagnosis not present

## 2018-03-23 LAB — SEDIMENTATION RATE: Sed Rate: 12 mm/hr (ref 0–30)

## 2018-03-23 MED ORDER — ONDANSETRON 4 MG PO TBDP: ORAL_TABLET | ORAL | 1 refills | Status: DC

## 2018-03-23 MED ORDER — HYOSCYAMINE SULFATE SL 0.125 MG SL SUBL: 1.0000 | SUBLINGUAL_TABLET | SUBLINGUAL | 1 refills | Status: DC | PRN

## 2018-03-23 MED ORDER — NA SULFATE-K SULFATE-MG SULF 17.5-3.13-1.6 GM/177ML PO SOLN: 1.0000 | Freq: Once | ORAL | 0 refills | Status: AC

## 2018-03-23 NOTE — Progress Notes (Signed)
Reviewed and agree with documentation and assessment and plan. K. Veena Nandigam , MD   

## 2018-03-23 NOTE — Progress Notes (Signed)
Subjective:    Patient ID: [name], female    DOB: [birth year], 77 y.o.   MRN: [id number]  HPI [name] is a pleasant 77 year old white female, known to Dr. Silverio Decamp  who comes in today for follow-up.  She has history of hypertension, atrial fibrillation, coronary artery disease, previous compression fractures.  She had a large tubovillous adenoma found at colonoscopy December 2016 at the appendiceal orifice with incomplete resection, also noted mild diverticulosis in the sigmoid colon. She was referred to surgery and underwent an extended appendectomy and partial cecectomy in March 2017.  She was advised to have follow-up colonoscopy in 1 year.  She has been reluctant to do that. Patient was last seen in the office on 02/27/2018.  She is felt to have chronic IBS type symptoms with alternating bowel habits but had been having more problems recently with urgency and loose stools sometimes up to 4-5 bowel movements per day.  She is also had about a 10 pound weight loss this year.  GI path panel was done and negative, she was asked to decrease her magnesium supplement, avoid artificial sweeteners, avoid lactose. Fecal elastase was borderline and she was started on a trial of Zen Pep 40,000 units 3 times daily with meals. She comes in today with an outline of her bowel movements over the past 3 weeks.  She actually became constipated when using the Zen Pep 4 times daily and is now taking it twice daily.  She had a stretch where she did not have a bowel movement for about 6 days and then eventually had to take a suppository.  She also continues to have intermittent episodes of urgency and fecal incontinence which have occurred about 5 times over the past 3 weeks, sporadic normal bowel movements and loose stools.  The Zen Pep does seem to be helping. She also describes to me episodes that she is been having over the past couple of years but seem to follow bouts of constipation.  She says she starts with a  runny nose and then nausea followed by a cold sweat, abdominal discomfort and then eventually evacuates her bowel usually starts with a more formed stool followed by several diarrheal stools.  Generally she has about an episode of month but has had 2-3 episodes over the past 3 weeks.  She had one last night and was up at 10:00, 11:00 and again in the middle of the night with watery diarrhea.  She is unaware of any specific food triggers. She says her weight loss has leveled off and she is actually gained a pound or 2 back.  Generally she does not have much of an appetite and eats 2 meals per day. She is very disturbed about the episodes of incontinence because she feels that this is preventing her from being able to travel which is something that she and her husband have enjoyed.  She says she has had multiple trips to emergency rooms all over the world over the past several years because of these acute episodes of nausea diaphoresis rhinorrhea abdominal pain followed by diarrhea. We discussed follow-up colonoscopy today.  She did not understand that the large tubovillous adenoma was a large precancerous lesion and is now willing to have colonoscopy.  Review of Systems Pertinent positive and negative review of systems were noted in the above HPI section.  All other review of systems was otherwise negative.  Outpatient Encounter Medications as of 03/23/2018  Medication Sig  . aspirin 81 MG tablet  Take 81 mg by mouth every evening.   [name] atorvastatin (LIPITOR) 20 MG tablet TAKE 1 TABLET EACH DAY. (Patient taking differently: TAKE 1 TABLET EACH DAY IN THE EVENING)  . Cholecalciferol (VITAMIN D3) 5000 units CAPS Take 1 capsule by mouth daily.  [name] denosumab (PROLIA) 60 MG/ML SOLN injection Inject 60 mg into the skin every 6 (six) months. Administer in upper arm, thigh, or abdomen  . diphenhydramine-acetaminophen (TYLENOL PM) 25-500 MG TABS tablet Take 0.5 tablets by mouth at bedtime as needed (SLEEP).  [name]  losartan (COZAAR) 25 MG tablet TAKE 1 TABLET ONCE DAILY.  . Magnesium 400 MG TABS Take 1 tablet by mouth 2 (two) times daily.   . Pancrelipase, Lip-Prot-Amyl, (ZENPEP) 40000-126000 units CPEP Take 1 capsule by mouth 3 (three) times daily with meals. (Patient taking differently: Take 1 capsule by mouth 2 (two) times daily with a meal. )  . Hyoscyamine Sulfate SL (LEVSIN/SL) 0.125 MG SUBL Place 1 tablet under the tongue as needed.  . Na Sulfate-K Sulfate-Mg Sulf 17.5-3.13-1.6 GM/177ML SOLN Take 1 kit by mouth once for 1 dose.  . ondansetron (ZOFRAN ODT) 4 MG disintegrating tablet Place 1 tab  By mouth on the tongue as needed for nausea.  . [DISCONTINUED] colestipol (COLESTID) 1 g tablet Take 1 tablet (1 g total) by mouth daily. At bedtime  . [DISCONTINUED] Multiple Vitamins-Minerals (MULTIVITAMIN ADULT PO) Take 1 capsule by mouth daily. Bone Up-multivitamin with calcium   No facility-administered encounter medications on file as of 03/23/2018.    Allergies  Allergen Reactions  . Anesthetics, Amide Other (See Comments)    Pt states she had hallucinations and HTN after procedure 01/19/16-she feels may have been reaction to anesthetic  . Lisinopril Cough   Patient Active Problem List   Diagnosis Date Noted  . Coronary artery disease involving native coronary artery of native heart without angina pectoris 04/28/2017  . Syncope 04/07/2017  . Shortness of breath 04/07/2017  . Abnormal stress test 04/07/2017  . Sinus bradycardia 02/21/2017  . Coronary artery calcification 02/21/2017  . Compression fracture of thoracic vertebra ([place]) 02/08/2017  . Dizziness and giddiness 02/08/2017  . Atrial fibrillation with rapid ventricular response ([place]) 01/21/2016  . Colon polyp s/p appy/partialcecetomy 01/19/3016 01/19/2016  . Orthostatic dizziness 04/25/2014  . Vertigo 12/26/2012  . Hypertension, uncontrolled 09/11/2012  . Diastolic dysfunction 84/69/6295  . Sleep disturbance 09/17/2011  . Hypotension  12/26/2010  . Orthostatic hypotension 12/26/2010  . UTI'S, RECURRENT 11/20/2009  . ADVERSE REACTION TO MEDICATION 07/31/2009  . SCOLIOSIS 05/25/2009  . FATIGUE 05/25/2009  . DIVERTICULOSIS, COLON 02/23/2009  . COLONIC POLYPS, ADENOMATOUS, HX OF 02/23/2009  . PARATHYROIDECTOMY 01/02/2009  . NEPHROLITHIASIS, HX OF 11/15/2008  . PALPITATIONS, OCCASIONAL 07/12/2008  . Essential hypertension 02/04/2008  . Constipation 01/01/2008  . LEG PAIN, LEFT 01/01/2008  . OSTEOPOROSIS 01/01/2008  . HYPERPARATHYROIDISM UNSPECIFIED 10/20/2007  . HYPERLIPIDEMIA 07/07/2007  . ANEMIA-NOS 07/07/2007  . URINARY INCONTINENCE 07/07/2007   Social History   Socioeconomic History  . Marital status: Married    Spouse name: Not on file  . Number of children: 3  . Years of education: Not on file  . Highest education level: Not on file  Occupational History  . Occupation: travel Primary school teacher: RETIRED  Social Needs  . Financial resource strain: Not on file  . Food insecurity:    Worry: Not on file    Inability: Not on file  . Transportation needs:    Medical: Not on file  Non-medical: Not on file  Tobacco Use  . Smoking status: Never Smoker  . Smokeless tobacco: Never Used  Substance and Sexual Activity  . Alcohol use: Yes    Comment: 1 bottle wine week  . Drug use: No  . Sexual activity: Never  Lifestyle  . Physical activity:    Days per week: Not on file    Minutes per session: Not on file  . Stress: Not on file  Relationships  . Social connections:    Talks on phone: Not on file    Gets together: Not on file    Attends religious service: Not on file    Active member of club or organization: Not on file    Attends meetings of clubs or organizations: Not on file    Relationship status: Not on file  . Intimate partner violence:    Fear of current or ex partner: Not on file    Emotionally abused: Not on file    Physically abused: Not on file    Forced sexual activity: Not on file    Other Topics Concern  . Not on file  Social History Narrative   HH of 5    Kids at home now   Married   travel agent.    2-3 c coffee in am    ocass wine    G3P2vaginal delivery   Pt cell [phone]             [name]'s family history includes Breast cancer in her paternal aunt; Diabetes in her unknown relative; Heart disease in her mother; Heart disease (age of onset: 63) in her father; Sudden death (age of onset: 32) in her father.      Objective:    Vitals:   03/23/18 1336  BP: 120/62  Pulse: 74    Physical Exam; well-developed elderly white female in no acute distress, very pleasant blood pressure 120/62, pulse 74, height 5 foot, weight 113, BMI 22.0.  HEENT; nontraumatic normocephalic EOMI PERRLA sclera anicteric, Supple, Cardiovascular; regular rate and rhythm with S1-S2 no murmur rub or gallop, Pulmonary; clear bilateral she is kyphotic, Abdomen ;soft, bowel sounds are present somewhat hyperactive is no palpable mass or hepatosplenomegaly, Rectal ;exam not done, Extremities; no clubbing cyanosis or edema skin warm and dry, Neuro psych ;alert and oriented x3, grossly nonfocal mood and affect appropriate       Assessment & Plan:   #58 77 year old white female with several year history of episodic bouts of diarrhea, now occurring with increasing frequency and occasional episodes of incontinence.  She describes discrete episodes of rhinorrhea, nausea, cold sweats followed by abdominal pain and then he eventually complete evacuation of her bowel.  Patient is also had a 10 pound weight loss this year I am not sure if this is all secondary to IBS vs  secretory etiology i.e. gastrinoma or carcinoid. Also consider medication induced component secondary to Cozaar/losartan  #2 borderline low fecal elastase-patient has had some response to Zen Pep #3 history of large tubovillous adenoma of the appendiceal orifice status post extended appendectomy and partial cecectomy March  2017-overdue for follow-up colonoscopy  #4 diverticulosis #5.  Coronary artery disease #6.  History of atrial fibrillation #7.  Hypertension  Plan; patient will be scheduled for colonoscopy with Dr. Silverio Decamp.  Procedure was discussed in detail with the patient including indications risks and benefits and she is agreeable to proceed.  Exline she may benefit from random biopsies being done at the time of  colonoscopy as well  Continue Zenpep 40,000 units 1 p.o. twice daily with a meal Patient thought that adding bulk to her diet seem to help, she will resume oatmeal daily Will give her a trial of Levsin sublingual for as needed use at onset of nausea diaphoresis and abdominal discomfort and Have also given her a prescription for Zofran 4 mg ODT to use as needed with these episodes.  She would like to find something that will help her so that she could travel.  Also going to have her hold her Cozaar over the next 2 to 3 weeks.  She is asked to call with a progress report, and if her symptoms significantly improved and this will need to be permanently discontinued.    Amy S Esterwood PA-C 03/23/2018   Cc: Coralyn Mark, Altamese Cabal, *

## 2018-03-23 NOTE — Patient Instructions (Addendum)
If you are age 78 or older, your body mass index should be between 23-30. Your Body mass index is 22.07 kg/m. If this is out of the aforementioned range listed, please consider follow up with your Primary Care Provider.  Your provider has requested that you go to the basement level for lab work before leaving today. Press "B" on the elevator. The lab is located at the first door on the left as you exit the elevator.  Hold the Cozaar for 2-3 weeks. Call us with as progress report.  Take Zenpep twice daily.   We sent prescriptions to your pharmacy. 1. Levsin SL  2. Zofran ODT 4 mg.   You have been scheduled for a colonoscopy. Please follow written instructions given to you at your visit today.  Please pick up your prep supplies at the pharmacy within the next 1-3 days. If you use inhalers (even only as needed), please bring them with you on the day of your procedure.  Restart Oatmeal daily.

## 2018-03-25 LAB — GASTRIN: Gastrin: 54 pg/mL (ref <=100)

## 2018-04-04 DIAGNOSIS — J069 Acute upper respiratory infection, unspecified: Secondary | ICD-10-CM | POA: Diagnosis not present

## 2018-04-04 DIAGNOSIS — J029 Acute pharyngitis, unspecified: Secondary | ICD-10-CM | POA: Diagnosis not present

## 2018-04-10 DIAGNOSIS — J0101 Acute recurrent maxillary sinusitis: Secondary | ICD-10-CM | POA: Diagnosis not present

## 2018-04-21 DIAGNOSIS — M810 Age-related osteoporosis without current pathological fracture: Secondary | ICD-10-CM | POA: Diagnosis not present

## 2018-06-01 ENCOUNTER — Telehealth: Payer: Self-pay | Admitting: Gastroenterology

## 2018-06-01 NOTE — Telephone Encounter (Signed)
Spoke with the patient. She had diarrhea yesterday. She reports it is the usual thing that happens to her. She says it is cyclic. 4 to 5 days of constipation, then 4 to 5 days of diarrhea. No fever or bloody stools.  Discussed hydration, full liquid and the clear liquids she will consume tomorrow.

## 2018-06-03 ENCOUNTER — Ambulatory Visit (AMBULATORY_SURGERY_CENTER): Payer: Medicare Other | Admitting: Gastroenterology

## 2018-06-03 ENCOUNTER — Encounter: Payer: Self-pay | Admitting: Gastroenterology

## 2018-06-03 VITALS — BP 200/96 | HR 57 | Temp 97.3°F | Resp 15 | Ht 60.0 in | Wt 113.0 lb

## 2018-06-03 DIAGNOSIS — R197 Diarrhea, unspecified: Secondary | ICD-10-CM

## 2018-06-03 DIAGNOSIS — Z86010 Personal history of colonic polyps: Secondary | ICD-10-CM | POA: Diagnosis not present

## 2018-06-03 DIAGNOSIS — K649 Unspecified hemorrhoids: Secondary | ICD-10-CM | POA: Diagnosis not present

## 2018-06-03 DIAGNOSIS — I10 Essential (primary) hypertension: Secondary | ICD-10-CM | POA: Diagnosis not present

## 2018-06-03 DIAGNOSIS — D126 Benign neoplasm of colon, unspecified: Secondary | ICD-10-CM

## 2018-06-03 MED ORDER — SODIUM CHLORIDE 0.9 % IV SOLN: 500.0000 mL | Freq: Once | INTRAVENOUS | Status: DC

## 2018-06-03 NOTE — Progress Notes (Signed)
I have reviewed the patient's medical history in detail and updated the computerized patient record.

## 2018-06-03 NOTE — Patient Instructions (Signed)
YOU HAD AN ENDOSCOPIC PROCEDURE TODAY AT THE  ENDOSCOPY CENTER:   Refer to the procedure report that was given to you for any specific questions about what was found during the examination.  If the procedure report does not answer your questions, please call your gastroenterologist to clarify.  If you requested that your care partner not be given the details of your procedure findings, then the procedure report has been included in a sealed envelope for you to review at your convenience later.  YOU SHOULD EXPECT: Some feelings of bloating in the abdomen. Passage of more gas than usual.  Walking can help get rid of the air that was put into your GI tract during the procedure and reduce the bloating. If you had a lower endoscopy (such as a colonoscopy or flexible sigmoidoscopy) you may notice spotting of blood in your stool or on the toilet paper. If you underwent a bowel prep for your procedure, you may not have a normal bowel movement for a few days.  Please Note:  You might notice some irritation and congestion in your nose or some drainage.  This is from the oxygen used during your procedure.  There is no need for concern and it should clear up in a day or so.  SYMPTOMS TO REPORT IMMEDIATELY:   Following lower endoscopy (colonoscopy or flexible sigmoidoscopy):  Excessive amounts of blood in the stool  Significant tenderness or worsening of abdominal pains  Swelling of the abdomen that is new, acute  Fever of 100F or higher  For urgent or emergent issues, a gastroenterologist can be reached at any hour by calling [phone].   DIET:  We do recommend a small meal at first, but then you may proceed to your regular diet.  Drink plenty of fluids but you should avoid alcoholic beverages for 24 hours.  ACTIVITY:  You should plan to take it easy for the rest of today and you should NOT DRIVE or use heavy machinery until tomorrow (because of the sedation medicines used during the test).     FOLLOW UP: Our staff will call the number listed on your records the next business day following your procedure to check on you and address any questions or concerns that you may have regarding the information given to you following your procedure. If we do not reach you, we will leave a message.  However, if you are feeling well and you are not experiencing any problems, there is no need to return our call.  We will assume that you have returned to your regular daily activities without incident.  If any biopsies were taken you will be contacted by phone or by letter within the next 1-3 weeks.  Please call us at [phone] if you have not heard about the biopsies in 3 weeks.    SIGNATURES/CONFIDENTIALITY: You and/or your care partner have signed paperwork which will be entered into your electronic medical record.  These signatures attest to the fact that that the information above on your After Visit Summary has been reviewed and is understood.  Full responsibility of the confidentiality of this discharge information lies with you and/or your care-partner. 

## 2018-06-03 NOTE — Progress Notes (Signed)
Called to room to assist during endoscopic procedure.  Patient ID and intended procedure confirmed with present staff. Received instructions for my participation in the procedure from the performing physician.  

## 2018-06-03 NOTE — Op Note (Signed)
[facility] Patient Name: [name] Procedure Date: 06/03/2018 11:40 AM MRN: [id number] Endoscopist: Mauri Pole , MD Age: 77 Referring MD:  Date of Birth: 08-18-1941 Gender: Female Account #: [id number] Procedure:                Colonoscopy Indications:              Clinically significant diarrhea of unexplained                            origin. History of large tubulovillous adenoma Medicines:                Monitored Anesthesia Care Procedure:                Pre-Anesthesia Assessment:                           - Prior to the procedure, a History and Physical                            was performed, and patient medications and                            allergies were reviewed. The patient's tolerance of                            previous anesthesia was also reviewed. The risks                            and benefits of the procedure and the sedation                            options and risks were discussed with the patient.                            All questions were answered, and informed consent                            was obtained. Prior Anticoagulants: The patient has                            taken no previous anticoagulant or antiplatelet                            agents. ASA Grade Assessment: II - A patient with                            mild systemic disease. After reviewing the risks                            and benefits, the patient was deemed in                            satisfactory condition to undergo the procedure.  After obtaining informed consent, the colonoscope                            was passed under direct vision. Throughout the                            procedure, the patient's blood pressure, pulse, and                            oxygen saturations were monitored continuously. The                            Model PCF-[id number]) scope was introduced                            through  the anus and advanced to the the cecum,                            identified by appendiceal orifice and ileocecal                            valve. The colonoscopy was performed without                            difficulty. The patient tolerated the procedure                            well. The quality of the bowel preparation was                            excellent. The ileocecal valve, appendiceal                            orifice, and rectum were photographed. Scope In: 87:56:43 AM Scope Out: 12:07:50 PM Scope Withdrawal Time: 0 hours 9 minutes 56 seconds  Total Procedure Duration: 0 hours 21 minutes 41 seconds  Findings:                 The digital rectal exam findings include decreased                            sphincter tone.                           Multiple small and large-mouthed diverticula were                            found in the sigmoid colon. There was narrowing of                            the colon in association with the diverticular                            opening. There was evidence of diverticular spasm.  Non-bleeding internal hemorrhoids were found during                            retroflexion. The hemorrhoids were medium-sized.                           Normal mucosa was found in the entire colon.                            Biopsies for histology were taken with a cold                            forceps from the right colon and left colon for                            evaluation of microscopic colitis. Complications:            No immediate complications. Estimated Blood Loss:     Estimated blood loss was minimal. Impression:               - Decreased sphincter tone found on digital rectal                            exam.                           - Moderate diverticulosis in the sigmoid colon.                            There was narrowing of the colon in association                            with the diverticular opening.  There was evidence                            of diverticular spasm.                           - Non-bleeding internal hemorrhoids.                           - Normal mucosa in the entire examined colon.                            Biopsied. Recommendation:           - Patient has a contact number available for                            emergencies. The signs and symptoms of potential                            delayed complications were discussed with the                            patient. Return to normal activities tomorrow.  Written discharge instructions were provided to the                            patient.                           - Resume previous diet.                           - Continue present medications.                           - Await pathology results.                           - No repeat colonoscopy due to age. Mauri Pole, MD 06/03/2018 12:15:13 PM This report has been signed electronically.

## 2018-06-03 NOTE — Progress Notes (Signed)
Report to PACU, RN, vss, BBS= Clear.  

## 2018-06-04 ENCOUNTER — Telehealth: Payer: Self-pay

## 2018-06-04 NOTE — Telephone Encounter (Signed)
  Follow up Call-  Call Zadok Holaway number 06/03/2018 10/23/2015  Post procedure Call Lauro Manlove phone  # [phone]  Permission to leave phone message Yes Yes  Some recent data might be hidden     Patient questions:  Do you have a fever, pain , or abdominal swelling? No. Pain Score  0 *  Have you tolerated food without any problems? Yes.    Have you been able to return to your normal activities? Yes.    Do you have any questions about your discharge instructions: Diet   No. Medications  No. Follow up visit  No.  Do you have questions or concerns about your Care? No.  Actions: * If pain score is 4 or above: No action needed, pain <4.

## 2018-06-11 ENCOUNTER — Telehealth: Payer: Self-pay | Admitting: Gastroenterology

## 2018-06-11 NOTE — Telephone Encounter (Signed)
error 

## 2018-06-23 ENCOUNTER — Telehealth: Payer: Self-pay | Admitting: Gastroenterology

## 2018-06-23 NOTE — Telephone Encounter (Signed)
Spoke with patient and she took Miralax x 4 days and now has diarrhea. She wanted to know if she should stop Miralax. Patient instructed to stop Miralax. Discussed using Miralax 1/2 dose or every other day,etc. Dosing according to how she needs it to be regular but not having diarrhea. She will hold Miralax and try this after stools normalize.

## 2018-06-29 DIAGNOSIS — F419 Anxiety disorder, unspecified: Secondary | ICD-10-CM | POA: Diagnosis not present

## 2018-06-30 DIAGNOSIS — Z961 Presence of intraocular lens: Secondary | ICD-10-CM | POA: Diagnosis not present

## 2018-06-30 DIAGNOSIS — H5010 Unspecified exotropia: Secondary | ICD-10-CM | POA: Diagnosis not present

## 2018-06-30 DIAGNOSIS — H52203 Unspecified astigmatism, bilateral: Secondary | ICD-10-CM | POA: Diagnosis not present

## 2018-07-21 DIAGNOSIS — Z1231 Encounter for screening mammogram for malignant neoplasm of breast: Secondary | ICD-10-CM | POA: Diagnosis not present

## 2018-07-30 DIAGNOSIS — F419 Anxiety disorder, unspecified: Secondary | ICD-10-CM | POA: Diagnosis not present

## 2018-08-05 DIAGNOSIS — H52223 Regular astigmatism, bilateral: Secondary | ICD-10-CM | POA: Diagnosis not present

## 2018-08-05 DIAGNOSIS — H5034 Intermittent alternating exotropia: Secondary | ICD-10-CM | POA: Diagnosis not present

## 2018-08-08 ENCOUNTER — Other Ambulatory Visit: Payer: Self-pay | Admitting: Cardiology

## 2018-08-10 ENCOUNTER — Ambulatory Visit (INDEPENDENT_AMBULATORY_CARE_PROVIDER_SITE_OTHER): Payer: Medicare Other | Admitting: Gastroenterology

## 2018-08-10 ENCOUNTER — Encounter (INDEPENDENT_AMBULATORY_CARE_PROVIDER_SITE_OTHER): Payer: Self-pay

## 2018-08-10 ENCOUNTER — Encounter: Payer: Self-pay | Admitting: Gastroenterology

## 2018-08-10 VITALS — BP 122/78 | HR 67 | Ht 60.0 in | Wt 110.2 lb

## 2018-08-10 DIAGNOSIS — R197 Diarrhea, unspecified: Secondary | ICD-10-CM | POA: Diagnosis not present

## 2018-08-10 DIAGNOSIS — K582 Mixed irritable bowel syndrome: Secondary | ICD-10-CM | POA: Diagnosis not present

## 2018-08-10 DIAGNOSIS — K5909 Other constipation: Secondary | ICD-10-CM | POA: Diagnosis not present

## 2018-08-10 NOTE — Progress Notes (Signed)
[name]    [id number]    04-10-1941  Primary Care Physician:[name], Teresa Cabal, MD  Referring Physician: Lanice Shirts, MD [address] [place], [place] [address]  Chief complaint: IBS  HPI: 77 year old female with history of large tubulovillous adenoma in cecum status post endoscopic removal, was extending into appendiceal orifice with incomplete removal status post extended appendectomy and partial septectomy March 2017.  She was last seen in office by [name] Mar 23, 2018 and was scheduled for colonoscopy for further evaluation of persistent diarrhea and surveillance for colon polyps and colorectal cancer screening. Colonoscopy June 03, 2018 showed severe sigmoid diverticulosis and internal hemorrhoids otherwise normal-appearing mucosa.  Biopsies negative for microscopic colitis. GI pathogen panel negative.  She had decreased pancreatic elastase normal fecal fat qualitative.  She was recommended to start Zenpep and also Colestid.  She is currently not taking Zantac or Colestid.  After colonoscopy diarrhea has improved and she currently has bowel movement once every 3 to 4 days associated with fecal urgency and sometimes multiple bowel movements only after she drinks 64 ounces of water mixed with crystal lyte.  Denies any blood in stool, abdominal pain, loss of appetite or weight loss.    Outpatient Encounter Medications as of 08/10/2018  Medication Sig  . aspirin 81 MG tablet Take 81 mg by mouth every evening.   . Cholecalciferol (VITAMIN D3) 5000 units CAPS Take 1 capsule by mouth daily.  [name] denosumab (PROLIA) 60 MG/ML SOLN injection Inject 60 mg into the skin every 6 (six) months. Administer in upper arm, thigh, or abdomen  . diphenhydramine-acetaminophen (TYLENOL PM) 25-500 MG TABS tablet Take 0.5 tablets by mouth at bedtime as needed (SLEEP).  [name] losartan (COZAAR) 25 MG tablet TAKE 1 TABLET ONCE DAILY.  . Magnesium 400 MG TABS Take 1  tablet by mouth 2 (two) times daily.   . [DISCONTINUED] atorvastatin (LIPITOR) 20 MG tablet TAKE 1 TABLET EACH DAY. (Patient not taking: Reported on 08/10/2018)  . [DISCONTINUED] Hyoscyamine Sulfate SL (LEVSIN/SL) 0.125 MG SUBL Place 1 tablet under the tongue as needed. (Patient not taking: Reported on 08/10/2018)  . [DISCONTINUED] ondansetron (ZOFRAN ODT) 4 MG disintegrating tablet Place 1 tab  By mouth on the tongue as needed for nausea. (Patient not taking: Reported on 08/10/2018)  . [DISCONTINUED] Pancrelipase, Lip-Prot-Amyl, (ZENPEP) 40000-126000 units CPEP Take 1 capsule by mouth 3 (three) times daily with meals. (Patient not taking: Reported on 08/10/2018)   Facility-Administered Encounter Medications as of 08/10/2018  Medication  . 0.9 %  sodium chloride infusion    Allergies as of 08/10/2018 - Review Complete 08/10/2018  Allergen Reaction Noted  . Anesthetics, amide Other (See Comments) 02/20/2016  . Lisinopril Cough 10/05/2009    Past Medical History:  Diagnosis Date  . Abdominal pain, unspecified site   . ADVERSE REACTION TO MEDICATION 07/31/2009  . ANEMIA-NOS 07/07/2007  . Arthritis   . Chest pain, unspecified   . Chronic kidney disease    hx of kidney stone  . COLONIC POLYPS, ADENOMATOUS, HX OF   . Disturbance of skin sensation   . DIVERTICULOSIS, COLON 02/23/2009  . Family history of diabetes mellitus   . Family history of malignant neoplasm of breast   . FATIGUE 05/25/2009  . Fever, unspecified   . Heart murmur    hx of  . HYPERLIPIDEMIA 07/07/2007  . HYPERPARATHYROIDISM UNSPECIFIED 10/20/2007  . HYPERTENSION 02/04/2008  . Irritable bowel syndrome (IBS)   .  NEPHROLITHIASIS, HX OF 11/15/2008  . OSTEOPOROSIS 01/01/2008  . Other acquired absence of organ    parathyroidectomy  . Pain in limb   . PALPITATIONS, OCCASIONAL 07/12/2008  . PARATHYROIDECTOMY 01/02/2009  . SCOLIOSIS 05/25/2009  . Seasonal allergies   . Unspecified constipation   . URINARY INCONTINENCE 07/07/2007  .  UTI'S, RECURRENT 11/20/2009   kimbrough     Past Surgical History:  Procedure Laterality Date  . BUNIONECTOMY    . COLONOSCOPY    . LAPAROSCOPIC APPENDECTOMY  01/19/2016   Procedure: APPENDECTOMY LAPAROSCOPIC with orifice of cecal polyp;  Surgeon: Leighton Ruff, MD;  Location: [facility];  Service: General;;  . LEFT HEART CATH AND CORONARY ANGIOGRAPHY N/A 04/07/2017   Procedure: Left Heart Cath and Coronary Angiography;  Surgeon: Nelva Bush, MD;  Location: [facility];  Service: Cardiovascular;  Laterality: N/A;  . PARATHYROIDECTOMY     Rt Superior open neck exploration  . POLYPECTOMY    . RIGHT OOPHORECTOMY    . TONSILLECTOMY      Family History  Problem Relation Age of Onset  . Sudden death Father 33  . Heart disease Father 17       MI  . Heart disease Mother        valve replacement  . Diabetes Unknown        1st degree relative  . Breast cancer Paternal Aunt   . Colon cancer Neg Hx   . Stomach cancer Neg Hx     Social History   Socioeconomic History  . Marital status: Married    Spouse name: Not on file  . Number of children: 3  . Years of education: Not on file  . Highest education level: Not on file  Occupational History  . Occupation: travel Primary school teacher: RETIRED  Social Needs  . Financial resource strain: Not on file  . Food insecurity:    Worry: Not on file    Inability: Not on file  . Transportation needs:    Medical: Not on file    Non-medical: Not on file  Tobacco Use  . Smoking status: Never Smoker  . Smokeless tobacco: Never Used  Substance and Sexual Activity  . Alcohol use: Yes    Comment: 1 bottle wine week  . Drug use: No  . Sexual activity: Never  Lifestyle  . Physical activity:    Days per week: Not on file    Minutes per session: Not on file  . Stress: Not on file  Relationships  . Social connections:    Talks on phone: Not on file    Gets together: Not on file    Attends religious service: Not on file    Active  member of club or organization: Not on file    Attends meetings of clubs or organizations: Not on file    Relationship status: Not on file  . Intimate partner violence:    Fear of current or ex partner: Not on file    Emotionally abused: Not on file    Physically abused: Not on file    Forced sexual activity: Not on file  Other Topics Concern  . Not on file  Social History Narrative   HH of 5    Kids at home now   Married   travel agent.    2-3 c coffee in am    ocass wine    G3P2vaginal delivery   Pt cell [phone]  Review of systems: Review of Systems  Constitutional: Negative for fever and chills.  HENT: Negative.   Eyes: Negative for blurred vision.  Respiratory: Negative for cough, shortness of breath and wheezing.   Cardiovascular: Negative for chest pain and palpitations.  Gastrointestinal: as per HPI Genitourinary: Negative for dysuria, urgency, frequency and hematuria.  Musculoskeletal: Negative for myalgias, back pain and joint pain.  Skin: Negative for itching and rash.  Neurological: Negative for dizziness, tremors, focal weakness, seizures and loss of consciousness.  Endo/Heme/Allergies: Positive for seasonal allergies.  Psychiatric/Behavioral: Negative for depression, suicidal ideas and hallucinations.  All other systems reviewed and are negative.   Physical Exam: Vitals:   08/10/18 1536  BP: 122/78  Pulse: 67   Body mass index is 21.52 kg/m. Gen:      No acute distress HEENT:  EOMI, sclera anicteric Neck:     No masses; no thyromegaly Lungs:    Clear to auscultation bilaterally; normal respiratory effort CV:         Regular rate and rhythm; no murmurs Abd:      + bowel sounds; soft, non-tender; no palpable masses, no distension Ext:    No edema; adequate peripheral perfusion Skin:      Warm and dry; no rash Neuro: alert and oriented x 3 Psych: normal mood and affect  Data Reviewed:  Reviewed labs, radiology imaging, old records  and pertinent past GI work up   Assessment and Plan/Recommendations:  77 year old female with history of tubulovillous adenoma extending into the appendiceal orifice status post extended appendectomy with partial cecotomy in 2017.  Colonoscopy July 2019 showed normal mucosa, biopsy negative for microscopic colitis, severe sigmoid diverticulosis and internal hemorrhoids. Chronic irritable bowel syndrome with alternating diarrhea and constipation.  Borderline low fecal elastase. She is currently having predominant constipation with bowel movement every 3 to 4 days. She consumes only 1 or 2 bottles [16 ounces] on most days except when she takes crystallite every 3 to 4 days mixed in 64 ounces of fluid. Advised patient to start drinking at least 4-5 bottles of water in a day and increase dietary fiber.   Avoid excessive consumption of simple sugars or artificial sweeteners Return in 2 to 3 months or sooner if needed  25 minutes was spent face-to-face with the patient. Greater than 50% of the time used for counseling as well as treatment plan and follow-up. She had multiple questions which were answered to her satisfaction  K. Denzil Magnuson , MD [phone]    CC: Coralyn Mark [name], *

## 2018-08-10 NOTE — Patient Instructions (Signed)
Increase fluid intake to 64 ounces per day  Increase dietary fiber   If you are age 78 or older, your body mass index should be between 23-30. Your Body mass index is 21.52 kg/m. If this is out of the aforementioned range listed, please consider follow up with your Primary Care Provider.  If you are age 11 or younger, your body mass index should be between 19-25. Your Body mass index is 21.52 kg/m. If this is out of the aformentioned range listed, please consider follow up with your Primary Care Provider.    Thank you for choosing [facility]  Karleen Hampshire Nandigam,MD

## 2018-08-17 ENCOUNTER — Encounter: Payer: Self-pay | Admitting: Gastroenterology

## 2018-08-21 DIAGNOSIS — F419 Anxiety disorder, unspecified: Secondary | ICD-10-CM | POA: Diagnosis not present

## 2018-08-21 DIAGNOSIS — Z23 Encounter for immunization: Secondary | ICD-10-CM | POA: Diagnosis not present

## 2018-08-21 DIAGNOSIS — L239 Allergic contact dermatitis, unspecified cause: Secondary | ICD-10-CM | POA: Diagnosis not present

## 2018-10-06 ENCOUNTER — Ambulatory Visit: Payer: Medicare Other | Admitting: Gastroenterology

## 2018-10-12 DIAGNOSIS — R768 Other specified abnormal immunological findings in serum: Secondary | ICD-10-CM | POA: Diagnosis not present

## 2018-10-12 DIAGNOSIS — R252 Cramp and spasm: Secondary | ICD-10-CM | POA: Diagnosis not present

## 2018-10-12 DIAGNOSIS — M810 Age-related osteoporosis without current pathological fracture: Secondary | ICD-10-CM | POA: Diagnosis not present

## 2018-10-12 DIAGNOSIS — M4854XA Collapsed vertebra, not elsewhere classified, thoracic region, initial encounter for fracture: Secondary | ICD-10-CM | POA: Diagnosis not present

## 2018-10-12 DIAGNOSIS — Z6820 Body mass index (BMI) 20.0-20.9, adult: Secondary | ICD-10-CM | POA: Diagnosis not present

## 2018-10-22 DIAGNOSIS — M810 Age-related osteoporosis without current pathological fracture: Secondary | ICD-10-CM | POA: Diagnosis not present

## 2018-12-02 DIAGNOSIS — E559 Vitamin D deficiency, unspecified: Secondary | ICD-10-CM | POA: Diagnosis not present

## 2018-12-02 DIAGNOSIS — E7800 Pure hypercholesterolemia, unspecified: Secondary | ICD-10-CM | POA: Diagnosis not present

## 2018-12-02 DIAGNOSIS — M4854XA Collapsed vertebra, not elsewhere classified, thoracic region, initial encounter for fracture: Secondary | ICD-10-CM | POA: Diagnosis not present

## 2018-12-02 DIAGNOSIS — Z1389 Encounter for screening for other disorder: Secondary | ICD-10-CM | POA: Diagnosis not present

## 2018-12-02 DIAGNOSIS — M810 Age-related osteoporosis without current pathological fracture: Secondary | ICD-10-CM | POA: Diagnosis not present

## 2018-12-02 DIAGNOSIS — Z0000 Encounter for general adult medical examination without abnormal findings: Secondary | ICD-10-CM | POA: Diagnosis not present

## 2018-12-02 DIAGNOSIS — I10 Essential (primary) hypertension: Secondary | ICD-10-CM | POA: Diagnosis not present

## 2018-12-07 ENCOUNTER — Other Ambulatory Visit: Payer: Self-pay | Admitting: Student

## 2019-01-18 DIAGNOSIS — Z8262 Family history of osteoporosis: Secondary | ICD-10-CM | POA: Diagnosis not present

## 2019-01-18 DIAGNOSIS — Z90710 Acquired absence of both cervix and uterus: Secondary | ICD-10-CM | POA: Diagnosis not present

## 2019-01-18 DIAGNOSIS — M810 Age-related osteoporosis without current pathological fracture: Secondary | ICD-10-CM | POA: Diagnosis not present

## 2019-03-09 DIAGNOSIS — K1121 Acute sialoadenitis: Secondary | ICD-10-CM | POA: Diagnosis not present

## 2019-03-09 DIAGNOSIS — Z7289 Other problems related to lifestyle: Secondary | ICD-10-CM | POA: Diagnosis not present

## 2019-03-17 ENCOUNTER — Encounter: Payer: Medicare Other | Admitting: Internal Medicine

## 2019-04-05 DIAGNOSIS — M810 Age-related osteoporosis without current pathological fracture: Secondary | ICD-10-CM | POA: Diagnosis not present

## 2019-04-05 DIAGNOSIS — Z6820 Body mass index (BMI) 20.0-20.9, adult: Secondary | ICD-10-CM | POA: Diagnosis not present

## 2019-04-05 DIAGNOSIS — R768 Other specified abnormal immunological findings in serum: Secondary | ICD-10-CM | POA: Diagnosis not present

## 2019-04-05 DIAGNOSIS — M4854XA Collapsed vertebra, not elsewhere classified, thoracic region, initial encounter for fracture: Secondary | ICD-10-CM | POA: Diagnosis not present

## 2019-04-20 DIAGNOSIS — M810 Age-related osteoporosis without current pathological fracture: Secondary | ICD-10-CM | POA: Diagnosis not present

## 2019-04-23 DIAGNOSIS — M810 Age-related osteoporosis without current pathological fracture: Secondary | ICD-10-CM | POA: Diagnosis not present

## 2019-05-13 DIAGNOSIS — I951 Orthostatic hypotension: Secondary | ICD-10-CM | POA: Diagnosis not present

## 2019-05-13 DIAGNOSIS — I10 Essential (primary) hypertension: Secondary | ICD-10-CM | POA: Diagnosis not present

## 2019-05-31 DIAGNOSIS — L720 Epidermal cyst: Secondary | ICD-10-CM | POA: Diagnosis not present

## 2019-05-31 DIAGNOSIS — L821 Other seborrheic keratosis: Secondary | ICD-10-CM | POA: Diagnosis not present

## 2019-05-31 DIAGNOSIS — L738 Other specified follicular disorders: Secondary | ICD-10-CM | POA: Diagnosis not present

## 2019-05-31 DIAGNOSIS — L84 Corns and callosities: Secondary | ICD-10-CM | POA: Diagnosis not present

## 2019-05-31 DIAGNOSIS — D1801 Hemangioma of skin and subcutaneous tissue: Secondary | ICD-10-CM | POA: Diagnosis not present

## 2019-05-31 DIAGNOSIS — L820 Inflamed seborrheic keratosis: Secondary | ICD-10-CM | POA: Diagnosis not present

## 2019-05-31 DIAGNOSIS — D225 Melanocytic nevi of trunk: Secondary | ICD-10-CM | POA: Diagnosis not present

## 2019-05-31 DIAGNOSIS — Z85828 Personal history of other malignant neoplasm of skin: Secondary | ICD-10-CM | POA: Diagnosis not present

## 2019-06-03 DIAGNOSIS — M810 Age-related osteoporosis without current pathological fracture: Secondary | ICD-10-CM | POA: Diagnosis not present

## 2019-06-03 DIAGNOSIS — E213 Hyperparathyroidism, unspecified: Secondary | ICD-10-CM | POA: Diagnosis not present

## 2019-06-03 DIAGNOSIS — N3946 Mixed incontinence: Secondary | ICD-10-CM | POA: Diagnosis not present

## 2019-06-03 DIAGNOSIS — I2510 Atherosclerotic heart disease of native coronary artery without angina pectoris: Secondary | ICD-10-CM | POA: Diagnosis not present

## 2019-06-03 DIAGNOSIS — E7800 Pure hypercholesterolemia, unspecified: Secondary | ICD-10-CM | POA: Diagnosis not present

## 2019-06-03 DIAGNOSIS — I10 Essential (primary) hypertension: Secondary | ICD-10-CM | POA: Diagnosis not present

## 2019-07-06 DIAGNOSIS — Z961 Presence of intraocular lens: Secondary | ICD-10-CM | POA: Diagnosis not present

## 2019-07-06 DIAGNOSIS — H5010 Unspecified exotropia: Secondary | ICD-10-CM | POA: Diagnosis not present

## 2019-07-06 DIAGNOSIS — H52202 Unspecified astigmatism, left eye: Secondary | ICD-10-CM | POA: Diagnosis not present

## 2019-07-19 ENCOUNTER — Other Ambulatory Visit: Payer: Self-pay

## 2019-07-19 ENCOUNTER — Ambulatory Visit (INDEPENDENT_AMBULATORY_CARE_PROVIDER_SITE_OTHER): Payer: Medicare Other | Admitting: Cardiology

## 2019-07-19 VITALS — BP 158/78 | HR 58 | Ht 63.0 in | Wt 110.8 lb

## 2019-07-19 DIAGNOSIS — Z7189 Other specified counseling: Secondary | ICD-10-CM | POA: Diagnosis not present

## 2019-07-19 DIAGNOSIS — R0609 Other forms of dyspnea: Secondary | ICD-10-CM

## 2019-07-19 DIAGNOSIS — I10 Essential (primary) hypertension: Secondary | ICD-10-CM

## 2019-07-19 DIAGNOSIS — R0600 Dyspnea, unspecified: Secondary | ICD-10-CM

## 2019-07-19 NOTE — Patient Instructions (Addendum)

## 2019-07-19 NOTE — Progress Notes (Signed)
Cardiology Office Note:    Date:  07/19/2019   ID:  TYRELL DELPINO, DOB June 03, 1941, MRN [id number]  PCP:  Lanice Shirts, MD  Cardiologist:  Buford Dresser, MD PhD  Referring MD: Lanice Shirts, *   CC: establish care with me/general cardiology follow up  History of Present Illness:    [name] is a 78 y.o. female with a hx of HTN, CAD, HLD, paroxysmal atrial fibrillation (during hospitalization in 2017) who is seen as a new patient to me/prior Dr. Saunders Revel for the evaluation and management of cardiovascular disease.  Today: Walks 2 miles/day. Has some limitations due to leg imbalance issues. Also cannot go up the hills due to shortness of breath, which is newer for her. Shortness of breath is not severe, not at rest. Not accompanied by chest pain or other symptoms. Diet is not ideal; everything at [employer] has a sauce on it.   BP runs 111/65-190/95. Average is in the 140s/80s, which is when she feels best. High numbers are when she has anxiety. Improves with deep breathing/drinking water.   Denies chest pain, shortness of breath at rest or with normal exertion. No PND, orthopnea, LE edema or unexpected weight gain. No syncope or palpitations.  Past Medical History:  Diagnosis Date  . Abdominal pain, unspecified site   . ADVERSE REACTION TO MEDICATION 07/31/2009  . ANEMIA-NOS 07/07/2007  . Arthritis   . Chest pain, unspecified   . Chronic kidney disease    hx of kidney stone  . COLONIC POLYPS, ADENOMATOUS, HX OF   . Disturbance of skin sensation   . DIVERTICULOSIS, COLON 02/23/2009  . Family history of diabetes mellitus   . Family history of malignant neoplasm of breast   . FATIGUE 05/25/2009  . Fever, unspecified   . Heart murmur    hx of  . HYPERLIPIDEMIA 07/07/2007  . HYPERPARATHYROIDISM UNSPECIFIED 10/20/2007  . HYPERTENSION 02/04/2008  . Irritable bowel syndrome (IBS)   . NEPHROLITHIASIS, HX OF 11/15/2008  . OSTEOPOROSIS 01/01/2008  . Other acquired  absence of organ    parathyroidectomy  . Pain in limb   . PALPITATIONS, OCCASIONAL 07/12/2008  . PARATHYROIDECTOMY 01/02/2009  . SCOLIOSIS 05/25/2009  . Seasonal allergies   . Unspecified constipation   . URINARY INCONTINENCE 07/07/2007  . UTI'S, RECURRENT 11/20/2009   kimbrough     Past Surgical History:  Procedure Laterality Date  . BUNIONECTOMY    . COLONOSCOPY    . LAPAROSCOPIC APPENDECTOMY  01/19/2016   Procedure: APPENDECTOMY LAPAROSCOPIC with orifice of cecal polyp;  Surgeon: Leighton Ruff, MD;  Location: [facility];  Service: General;;  . LEFT HEART CATH AND CORONARY ANGIOGRAPHY N/A 04/07/2017   Procedure: Left Heart Cath and Coronary Angiography;  Surgeon: Nelva Bush, MD;  Location: [facility];  Service: Cardiovascular;  Laterality: N/A;  . PARATHYROIDECTOMY     Rt Superior open neck exploration  . POLYPECTOMY    . RIGHT OOPHORECTOMY    . TONSILLECTOMY      Current Medications: Current Outpatient Medications on File Prior to Visit  Medication Sig  . aspirin 81 MG tablet Take 81 mg by mouth every evening.   [name] atorvastatin (LIPITOR) 10 MG tablet   . Cholecalciferol (VITAMIN D3) 5000 units CAPS Take 1 capsule by mouth daily.  [name] denosumab (PROLIA) 60 MG/ML SOLN injection Inject 60 mg into the skin every 6 (six) months. Administer in upper arm, thigh, or abdomen  . diphenhydramine-acetaminophen (TYLENOL PM) 25-500 MG TABS tablet Take  0.5 tablets by mouth at bedtime as needed (SLEEP).  [name] losartan (COZAAR) 25 MG tablet Take 1 tablet (25 mg total) by mouth daily. NEED OV.  . Magnesium 400 MG TABS Take 1 tablet by mouth 2 (two) times daily.    Current Facility-Administered Medications on File Prior to Visit  Medication  . 0.9 %  sodium chloride infusion     Allergies:   Anesthetics, amide and Lisinopril   Social History   Socioeconomic History  . Marital status: Married    Spouse name: Not on file  . Number of children: 3  . Years of education: Not on file  .  Highest education level: Not on file  Occupational History  . Occupation: travel Primary school teacher: RETIRED  Social Needs  . Financial resource strain: Not on file  . Food insecurity    Worry: Not on file    Inability: Not on file  . Transportation needs    Medical: Not on file    Non-medical: Not on file  Tobacco Use  . Smoking status: Never Smoker  . Smokeless tobacco: Never Used  Substance and Sexual Activity  . Alcohol use: Yes    Comment: 1 bottle wine week  . Drug use: No  . Sexual activity: Never  Lifestyle  . Physical activity    Days per week: Not on file    Minutes per session: Not on file  . Stress: Not on file  Relationships  . Social Herbalist on phone: Not on file    Gets together: Not on file    Attends religious service: Not on file    Active member of club or organization: Not on file    Attends meetings of clubs or organizations: Not on file    Relationship status: Not on file  Other Topics Concern  . Not on file  Social History Narrative   HH of 5    Kids at home now   Married   travel agent.    2-3 c coffee in am    ocass wine    G3P2vaginal delivery   Pt cell [phone]              Family History: The patient's family history includes Breast cancer in her paternal aunt; Diabetes in her unknown relative; Heart disease in her mother; Heart disease (age of onset: 24) in her father; Sudden death (age of onset: 9) in her father. There is no history of Colon cancer or Stomach cancer.  ROS:   Please see the history of present illness.  Additional pertinent ROS: Constitutional: Negative for chills, fever, night sweats, unintentional weight loss  HENT: Negative for ear pain and hearing loss.   Eyes: Negative for loss of vision and eye pain.  Respiratory: Negative for cough, sputum, wheezing.   Cardiovascular: See HPI. Gastrointestinal: Negative for abdominal pain, melena, and hematochezia.  Genitourinary: Negative for dysuria and  hematuria.  Musculoskeletal: Negative for falls and myalgias.  Skin: Negative for itching and rash.  Neurological: Negative for focal weakness, focal sensory changes and loss of consciousness.  Endo/Heme/Allergies: Does not bruise/bleed easily.     EKGs/Labs/Other Studies Reviewed:    The following studies were reviewed today: LHC (04/07/17): Minimal CAD involving large epicardial coronary arteries. Mild to moderate stenosis of diagonal branches is evident. Normal LV contraction. Normal LV filling pressure. Small right radial artery.  Pharmacologic myocardial perfusion stress test (03/24/17): High risk study with small in size,  mild in severity, basal inferoseptal and mid inferoseptal defects that could reflect diaphragmatic attenuation and/or ischemia. Is also a small in size, moderate in severity, partially reversible apical defect. Moderate in size, moderate in severity, basal anteroseptal, mid anteroseptal, and apical septal defect is reversible and concerning for ischemia. LVEF 67%.  Holter monitor (02/18/17): Predominantly sinus rhythm with rare PACs and PVCs. No significant arrhythmias.  EKG:  EKG is personally reviewed.  The ekg ordered today demonstrates sinus bradycardia at 58 bpm  Recent Labs: No results found for requested labs within last 8760 hours.  Recent Lipid Panel    Component Value Date/Time   CHOL 132 01/25/2015 1112   TRIG 64 01/25/2015 1112   HDL 68 01/25/2015 1112   CHOLHDL 1.9 01/25/2015 1112   VLDL 13 01/25/2015 1112   LDLCALC 51 01/25/2015 1112   LDLDIRECT 156.3 03/28/2011 1038    Physical Exam:    VS:  BP (!) 158/78   Pulse (!) 58   Ht 5\' 3"  (1.6 m)   Wt 110 lb 12.8 oz (50.3 kg)   SpO2 96%   BMI 19.63 kg/m     Wt Readings from Last 3 Encounters:  07/19/19 110 lb 12.8 oz (50.3 kg)  08/10/18 110 lb 3.2 oz (50 kg)  06/03/18 113 lb (51.3 kg)     GEN: Well nourished, well developed in no acute distress HEENT: Normal, moist mucous membranes NECK:  No JVD CARDIAC: regular rhythm, normal S1 and S2, no murmurs, rubs, gallops.  VASCULAR: Radial and DP pulses 2+ bilaterally. No carotid bruits RESPIRATORY:  Clear to auscultation without rales, wheezing or rhonchi  ABDOMEN: Soft, non-tender, non-distended MUSCULOSKELETAL:  Ambulates independently SKIN: Warm and dry, no edema NEUROLOGIC:  Alert and oriented x 3. No focal neuro deficits noted. PSYCHIATRIC:  Normal affect    ASSESSMENT:    1. Dyspnea on exertion   2. Essential hypertension   3. Cardiac risk counseling   4. Counseling on health promotion and disease prevention    PLAN:    Dyspnea on exertion:  -sinus bradycardia on ECG -she is going to work on watching her diet and trying to increase her exercise -if symptoms gradually worse, she will call me -counseled on red flag warning signs that need immediate medical attention -nonobstructive CAD on cath 2018  Hypertension: -on losartan 25 mg daily -discussed that goal for BP is <130/80. She feels best when it is in the 140s. Rare spikes, and she can bring them down with relaxing/deep breathing -counseled her that if her BP continues to creep up or she has more high pressures, she should call me and we will make adjustments -continue home BP monitoring -declines medication management today  Cardiac risk counseling and prevention recommendations: -recommend heart healthy/Mediterranean diet, with whole grains, fruits, vegetable, fish, lean meats, nuts, and olive oil. Limit salt. -recommend moderate walking, 3-5 times/week for 30-50 minutes each session. Aim for at least 150 minutes.week. Goal should be pace of 3 miles/hours, or walking 1.5 miles in 30 minutes -recommend avoidance of tobacco products. Avoid excess alcohol. -on aspirin 81 mg daily per personal preference  Plan for follow up: 1 year or sooner PRN  Medication Adjustments/Labs and Tests Ordered: Current medicines are reviewed at length with the patient today.   Concerns regarding medicines are outlined above.  Orders Placed This Encounter  Procedures  . EKG 12-Lead   No orders of the defined types were placed in this encounter.   Patient Instructions  Medication Instructions:  Your  Physician recommend you continue on your current medication as directed.    If you need a refill on your cardiac medications before your next appointment, please call your pharmacy.   Lab work: None  Testing/Procedures: None  Follow-Up: At [facility], you and your health needs are our priority.  As part of our continuing mission to provide you with exceptional heart care, we have created designated Provider Care Teams.  These Care Teams include your primary Cardiologist (physician) and Advanced Practice Providers (APPs -  Physician Assistants and Nurse Practitioners) who all work together to provide you with the care you need, when you need it. You will need a follow up appointment in 1 years.  Please call our office 2 months in advance to schedule this appointment.  You may see Dr. Harrell Gave or one of the following Advanced Practice Providers on your designated Care Team:   Rosaria Ferries, PA-C . Jory Sims, DNP, ANP        Signed, Buford Dresser, MD PhD 07/19/2019 3:09 PM   [place]

## 2019-07-26 ENCOUNTER — Encounter: Payer: Self-pay | Admitting: Cardiology

## 2019-08-19 DIAGNOSIS — S32000A Wedge compression fracture of unspecified lumbar vertebra, initial encounter for closed fracture: Secondary | ICD-10-CM | POA: Diagnosis not present

## 2019-08-19 DIAGNOSIS — S22000A Wedge compression fracture of unspecified thoracic vertebra, initial encounter for closed fracture: Secondary | ICD-10-CM | POA: Diagnosis not present

## 2019-08-25 DIAGNOSIS — Z1231 Encounter for screening mammogram for malignant neoplasm of breast: Secondary | ICD-10-CM | POA: Diagnosis not present

## 2019-08-25 DIAGNOSIS — Z803 Family history of malignant neoplasm of breast: Secondary | ICD-10-CM | POA: Diagnosis not present

## 2019-08-27 DIAGNOSIS — M25652 Stiffness of left hip, not elsewhere classified: Secondary | ICD-10-CM | POA: Diagnosis not present

## 2019-08-27 DIAGNOSIS — M545 Low back pain: Secondary | ICD-10-CM | POA: Diagnosis not present

## 2019-08-27 DIAGNOSIS — M25651 Stiffness of right hip, not elsewhere classified: Secondary | ICD-10-CM | POA: Diagnosis not present

## 2019-08-27 DIAGNOSIS — R293 Abnormal posture: Secondary | ICD-10-CM | POA: Diagnosis not present

## 2019-08-30 DIAGNOSIS — M25651 Stiffness of right hip, not elsewhere classified: Secondary | ICD-10-CM | POA: Diagnosis not present

## 2019-08-30 DIAGNOSIS — M25652 Stiffness of left hip, not elsewhere classified: Secondary | ICD-10-CM | POA: Diagnosis not present

## 2019-08-30 DIAGNOSIS — M545 Low back pain: Secondary | ICD-10-CM | POA: Diagnosis not present

## 2019-08-30 DIAGNOSIS — R293 Abnormal posture: Secondary | ICD-10-CM | POA: Diagnosis not present

## 2019-09-02 DIAGNOSIS — M25651 Stiffness of right hip, not elsewhere classified: Secondary | ICD-10-CM | POA: Diagnosis not present

## 2019-09-02 DIAGNOSIS — R293 Abnormal posture: Secondary | ICD-10-CM | POA: Diagnosis not present

## 2019-09-02 DIAGNOSIS — M545 Low back pain: Secondary | ICD-10-CM | POA: Diagnosis not present

## 2019-09-02 DIAGNOSIS — M25652 Stiffness of left hip, not elsewhere classified: Secondary | ICD-10-CM | POA: Diagnosis not present

## 2019-09-06 DIAGNOSIS — I10 Essential (primary) hypertension: Secondary | ICD-10-CM | POA: Diagnosis not present

## 2019-09-06 DIAGNOSIS — R3989 Other symptoms and signs involving the genitourinary system: Secondary | ICD-10-CM | POA: Diagnosis not present

## 2019-09-08 ENCOUNTER — Other Ambulatory Visit: Payer: Self-pay | Admitting: Internal Medicine

## 2019-09-08 ENCOUNTER — Ambulatory Visit
Admission: RE | Admit: 2019-09-08 | Discharge: 2019-09-08 | Disposition: A | Discharge status: Home / Self Care | Payer: Medicare Other | Source: Ambulatory Visit | Attending: Internal Medicine | Admitting: Internal Medicine

## 2019-09-08 DIAGNOSIS — M549 Dorsalgia, unspecified: Secondary | ICD-10-CM

## 2019-09-08 DIAGNOSIS — R319 Hematuria, unspecified: Secondary | ICD-10-CM

## 2019-09-08 DIAGNOSIS — N200 Calculus of kidney: Secondary | ICD-10-CM | POA: Diagnosis not present

## 2019-09-09 DIAGNOSIS — M545 Low back pain: Secondary | ICD-10-CM | POA: Diagnosis not present

## 2019-09-09 DIAGNOSIS — R293 Abnormal posture: Secondary | ICD-10-CM | POA: Diagnosis not present

## 2019-09-09 DIAGNOSIS — M25651 Stiffness of right hip, not elsewhere classified: Secondary | ICD-10-CM | POA: Diagnosis not present

## 2019-09-09 DIAGNOSIS — M25652 Stiffness of left hip, not elsewhere classified: Secondary | ICD-10-CM | POA: Diagnosis not present

## 2019-09-13 DIAGNOSIS — M25551 Pain in right hip: Secondary | ICD-10-CM | POA: Diagnosis not present

## 2019-09-16 DIAGNOSIS — M25652 Stiffness of left hip, not elsewhere classified: Secondary | ICD-10-CM | POA: Diagnosis not present

## 2019-09-16 DIAGNOSIS — M545 Low back pain: Secondary | ICD-10-CM | POA: Diagnosis not present

## 2019-09-16 DIAGNOSIS — R293 Abnormal posture: Secondary | ICD-10-CM | POA: Diagnosis not present

## 2019-09-16 DIAGNOSIS — M25651 Stiffness of right hip, not elsewhere classified: Secondary | ICD-10-CM | POA: Diagnosis not present

## 2019-10-07 DIAGNOSIS — I10 Essential (primary) hypertension: Secondary | ICD-10-CM | POA: Diagnosis not present

## 2019-10-07 DIAGNOSIS — I951 Orthostatic hypotension: Secondary | ICD-10-CM | POA: Diagnosis not present

## 2019-10-07 DIAGNOSIS — I700 Atherosclerosis of aorta: Secondary | ICD-10-CM | POA: Diagnosis not present

## 2019-10-07 DIAGNOSIS — N200 Calculus of kidney: Secondary | ICD-10-CM | POA: Diagnosis not present

## 2019-10-07 DIAGNOSIS — F419 Anxiety disorder, unspecified: Secondary | ICD-10-CM | POA: Diagnosis not present

## 2019-10-07 DIAGNOSIS — E7800 Pure hypercholesterolemia, unspecified: Secondary | ICD-10-CM | POA: Diagnosis not present

## 2019-10-07 DIAGNOSIS — I2510 Atherosclerotic heart disease of native coronary artery without angina pectoris: Secondary | ICD-10-CM | POA: Diagnosis not present

## 2019-10-07 DIAGNOSIS — M810 Age-related osteoporosis without current pathological fracture: Secondary | ICD-10-CM | POA: Diagnosis not present

## 2019-10-12 DIAGNOSIS — M545 Low back pain: Secondary | ICD-10-CM | POA: Diagnosis not present

## 2019-10-12 DIAGNOSIS — M546 Pain in thoracic spine: Secondary | ICD-10-CM | POA: Diagnosis not present

## 2019-10-15 DIAGNOSIS — M545 Low back pain: Secondary | ICD-10-CM | POA: Diagnosis not present

## 2019-10-18 DIAGNOSIS — M545 Low back pain: Secondary | ICD-10-CM | POA: Diagnosis not present

## 2019-10-21 DIAGNOSIS — M810 Age-related osteoporosis without current pathological fracture: Secondary | ICD-10-CM | POA: Diagnosis not present

## 2019-10-25 DIAGNOSIS — M810 Age-related osteoporosis without current pathological fracture: Secondary | ICD-10-CM | POA: Diagnosis not present

## 2020-05-15 ENCOUNTER — Telehealth: Payer: Self-pay | Admitting: Gastroenterology

## 2020-05-15 NOTE — Telephone Encounter (Signed)
Patient called states she has not had a BM in a week seeking advise

## 2020-05-15 NOTE — Telephone Encounter (Signed)
Spoke with Mr Sunga. The patient is away right now. He does not expect her back until 4:15 or 4:30 this evening.

## 2020-11-15 ENCOUNTER — Other Ambulatory Visit: Payer: Self-pay | Admitting: Internal Medicine

## 2020-11-15 DIAGNOSIS — R269 Unspecified abnormalities of gait and mobility: Secondary | ICD-10-CM

## 2020-11-22 ENCOUNTER — Ambulatory Visit (INDEPENDENT_AMBULATORY_CARE_PROVIDER_SITE_OTHER): Payer: Medicare Other | Admitting: Cardiology

## 2020-11-22 ENCOUNTER — Other Ambulatory Visit: Payer: Self-pay

## 2020-11-22 ENCOUNTER — Encounter: Payer: Self-pay | Admitting: Cardiology

## 2020-11-22 VITALS — BP 168/72 | HR 56 | Ht 60.0 in | Wt 115.6 lb

## 2020-11-22 DIAGNOSIS — Z7182 Exercise counseling: Secondary | ICD-10-CM

## 2020-11-22 DIAGNOSIS — I10 Essential (primary) hypertension: Secondary | ICD-10-CM

## 2020-11-22 DIAGNOSIS — Z7189 Other specified counseling: Secondary | ICD-10-CM | POA: Diagnosis not present

## 2020-11-22 DIAGNOSIS — I2510 Atherosclerotic heart disease of native coronary artery without angina pectoris: Secondary | ICD-10-CM | POA: Diagnosis not present

## 2020-11-22 NOTE — Patient Instructions (Addendum)
Medication Instructions:  Your Physician recommend you continue on your current medication as directed.    *If you need a refill on your cardiac medications before your next appointment, please call your pharmacy*   Lab Work: None   Testing/Procedures: None   Follow-Up: At [facility], you and your health needs are our priority.  As part of our continuing mission to provide you with exceptional heart care, we have created designated Provider Care Teams.  These Care Teams include your primary Cardiologist (physician) and Advanced Practice Providers (APPs -  Physician Assistants and Nurse Practitioners) who all work together to provide you with the care you need, when you need it.  We recommend signing up for the patient portal called "MyChart".  Sign up information is provided on this After Visit Summary.  MyChart is used to connect with patients for Virtual Visits (Telemedicine).  Patients are able to view lab/test results, encounter notes, upcoming appointments, etc.  Non-urgent messages can be sent to your provider as well.   To learn more about what you can do with MyChart, go to [email or web address].    Your next appointment:   1 year(s)  The format for your next appointment:   In Person  Provider:   Jodelle Red, MD   Other Instructions Goal is 150 minutes/week of walking.  Aim for average blood pressure of 140/80. If you are having a lot of high or low blood pressures, call me.

## 2020-11-22 NOTE — Progress Notes (Signed)
Cardiology Office Note:    Date:  11/22/2020   ID:  [name], DOB 1941/11/17, MRN [id number]  PCP:  Lanice Shirts, MD  Cardiologist:  Buford Dresser, MD PhD  Referring MD: Lanice Shirts, *   CC: follow up  History of Present Illness:    [name] is a 80 y.o. female with a hx of HTN, CAD, HLD, paroxysmal atrial fibrillation (during hospitalization in 2017) who is seen for follow up today. I initially met her 07/19/2019 as a new patient to me/prior Dr. Saunders Revel for the evaluation and management of cardiovascular disease.  Today: Lost her husband unexpectedly last month, offered my condolences. Blood pressure has been very high on occasion, rarely over 200. But also has lows, yesterday 370 systolic. Has had more shakiness. Valium has helped tremendously.   She feels best when her BP is around 488 systolic. We discussed what causes high blood pressure, how to manage.  She lives at [facility], minimally active. Walking up hills is hard for her, and she has had two falls. Doesn't like using the pool. We discussed exercise guidelines and finding activities she enjoys.  Denies chest pain, shortness of breath at rest. No PND, orthopnea, LE edema or unexpected weight gain. No syncope, rare palpitations.   Past Medical History:  Diagnosis Date  . Abdominal pain, unspecified site   . ADVERSE REACTION TO MEDICATION 07/31/2009  . ANEMIA-NOS 07/07/2007  . Arthritis   . Chest pain, unspecified   . Chronic kidney disease    hx of kidney stone  . COLONIC POLYPS, ADENOMATOUS, HX OF   . Disturbance of skin sensation   . DIVERTICULOSIS, COLON 02/23/2009  . Family history of diabetes mellitus   . Family history of malignant neoplasm of breast   . FATIGUE 05/25/2009  . Fever, unspecified   . Heart murmur    hx of  . HYPERLIPIDEMIA 07/07/2007  . HYPERPARATHYROIDISM UNSPECIFIED 10/20/2007  . HYPERTENSION 02/04/2008  . Irritable bowel syndrome (IBS)   . NEPHROLITHIASIS, HX OF  11/15/2008  . OSTEOPOROSIS 01/01/2008  . Other acquired absence of organ    parathyroidectomy  . Pain in limb   . PALPITATIONS, OCCASIONAL 07/12/2008  . PARATHYROIDECTOMY 01/02/2009  . SCOLIOSIS 05/25/2009  . Seasonal allergies   . Unspecified constipation   . URINARY INCONTINENCE 07/07/2007  . UTI'S, RECURRENT 11/20/2009   kimbrough     Past Surgical History:  Procedure Laterality Date  . BUNIONECTOMY    . COLONOSCOPY    . LAPAROSCOPIC APPENDECTOMY  01/19/2016   Procedure: APPENDECTOMY LAPAROSCOPIC with orifice of cecal polyp;  Surgeon: Leighton Ruff, MD;  Location: [facility];  Service: General;;  . LEFT HEART CATH AND CORONARY ANGIOGRAPHY N/A 04/07/2017   Procedure: Left Heart Cath and Coronary Angiography;  Surgeon: Nelva Bush, MD;  Location: [facility];  Service: Cardiovascular;  Laterality: N/A;  . PARATHYROIDECTOMY     Rt Superior open neck exploration  . POLYPECTOMY    . RIGHT OOPHORECTOMY    . TONSILLECTOMY      Current Medications: Current Outpatient Medications on File Prior to Visit  Medication Sig  . aspirin 81 MG tablet Take 81 mg by mouth every evening.  [name] atorvastatin (LIPITOR) 10 MG tablet   . Cholecalciferol (VITAMIN D3) 5000 units CAPS Take 1 capsule by mouth daily.  [name] denosumab (PROLIA) 60 MG/ML SOLN injection Inject 60 mg into the skin every 6 (six) months. Administer in upper arm, thigh, or abdomen  . diazepam (VALIUM)  2 MG tablet See admin instructions.  . diphenhydramine-acetaminophen (TYLENOL PM) 25-500 MG TABS tablet Take 0.5 tablets by mouth at bedtime as needed (SLEEP).  [name] losartan (COZAAR) 25 MG tablet Take 1 tablet (25 mg total) by mouth daily. NEED OV.  . Magnesium 400 MG TABS Take 1 tablet by mouth 2 (two) times daily.    Current Facility-Administered Medications on File Prior to Visit  Medication  . 0.9 %  sodium chloride infusion     Allergies:   Anesthetics, amide and Lisinopril   Social History   Tobacco Use  . Smoking status:  Never Smoker  . Smokeless tobacco: Never Used  Vaping Use  . Vaping Use: Never used  Substance Use Topics  . Alcohol use: Yes    Comment: 1 bottle wine week  . Drug use: No    Family History: The patient's family history includes Breast cancer in her paternal aunt; Diabetes in her unknown relative; Heart disease in her mother; Heart disease (age of onset: 91) in her father; Sudden death (age of onset: 40) in her father. There is no history of Colon cancer or Stomach cancer.  ROS:   Please see the history of present illness.  Additional pertinent ROS otherwise unremarkable.   EKGs/Labs/Other Studies Reviewed:    The following studies were reviewed today: LHC (04/07/17): Minimal CAD involving large epicardial coronary arteries. Mild to moderate stenosis of diagonal branches is evident. Normal LV contraction. Normal LV filling pressure. Small right radial artery.  Pharmacologic myocardial perfusion stress test (03/24/17): High risk study with small in size, mild in severity, basal inferoseptal and mid inferoseptal defects that could reflect diaphragmatic attenuation and/or ischemia. Is also a small in size, moderate in severity, partially reversible apical defect. Moderate in size, moderate in severity, basal anteroseptal, mid anteroseptal, and apical septal defect is reversible and concerning for ischemia. LVEF 67%.  Holter monitor (02/18/17): Predominantly sinus rhythm with rare PACs and PVCs. No significant arrhythmias.  EKG:  EKG is personally reviewed.  The ekg ordered today demonstrates sinus bradycardia at 56 bpm  Recent Labs: No results found for requested labs within last 8760 hours.  Recent Lipid Panel    Component Value Date/Time   CHOL 132 01/25/2015 1112   TRIG 64 01/25/2015 1112   HDL 68 01/25/2015 1112   CHOLHDL 1.9 01/25/2015 1112   VLDL 13 01/25/2015 1112   LDLCALC 51 01/25/2015 1112   LDLDIRECT 156.3 03/28/2011 1038    Physical Exam:    VS:  BP (!) 168/72    Pulse (!) 56   Ht 5' (1.524 m)   Wt 115 lb 9.6 oz (52.4 kg)   SpO2 100%   BMI 22.58 kg/m     Wt Readings from Last 3 Encounters:  11/22/20 115 lb 9.6 oz (52.4 kg)  07/19/19 110 lb 12.8 oz (50.3 kg)  08/10/18 110 lb 3.2 oz (50 kg)    GEN: Well nourished, well developed in no acute distress HEENT: Normal, moist mucous membranes NECK: No JVD CARDIAC: regular rhythm, normal S1 and S2, no rubs or gallops. No murmur. VASCULAR: Radial and DP pulses 2+ bilaterally. No carotid bruits RESPIRATORY:  Clear to auscultation without rales, wheezing or rhonchi  ABDOMEN: Soft, non-tender, non-distended MUSCULOSKELETAL:  Ambulates independently SKIN: Warm and dry, no edema NEUROLOGIC:  Alert and oriented x 3. No focal neuro deficits noted. PSYCHIATRIC:  Normal affect   ASSESSMENT:    1. Essential hypertension   2. Nonocclusive coronary atherosclerosis of native coronary artery  3. Cardiac risk counseling   4. Counseling on health promotion and disease prevention   5. Exercise counseling    PLAN:    nonobstructive CAD  -on cath 2018 -continue aspirin, atorvastatin -counseled on red flag warning signs that need immediate medical attention  Hypertension: labile -on losartan 25 mg daily -we discussed triggers for elevated BP at length. She notes that if she relaxes, takes a deep breath, or takes valium, her BP improves. We discussed how the fight or flight response can affect BP. She has had a lot of stress since losing her husband last month.  -she also has intermittent low readings, so there is risk to overtreatment -she will continue checking at home. If her BP is more sustained in the >160 range even with relaxing, she should call me and we will discuss additional medications  Cardiac risk counseling and prevention recommendations: -recommend heart healthy/Mediterranean diet, with whole grains, fruits, vegetable, fish, lean meats, nuts, and olive oil. Limit salt. -recommend moderate  walking, 3-5 times/week for 30-50 minutes each session. Aim for at least 150 minutes.week. Goal should be pace of 3 miles/hours, or walking 1.5 miles in 30 minutes -recommend avoidance of tobacco products. Avoid excess alcohol. -on aspirin 81 mg daily per personal preference  Plan for follow up: 1 year or sooner PRN  Medication Adjustments/Labs and Tests Ordered: Current medicines are reviewed at length with the patient today.  Concerns regarding medicines are outlined above.  Orders Placed This Encounter  Procedures  . EKG 12-Lead   No orders of the defined types were placed in this encounter.   Patient Instructions  Medication Instructions:  Your Physician recommend you continue on your current medication as directed.    *If you need a refill on your cardiac medications before your next appointment, please call your pharmacy*   Lab Work: None   Testing/Procedures: None   Follow-Up: At [facility], you and your health needs are our priority.  As part of our continuing mission to provide you with exceptional heart care, we have created designated Provider Care Teams.  These Care Teams include your primary Cardiologist (physician) and Advanced Practice Providers (APPs -  Physician Assistants and Nurse Practitioners) who all work together to provide you with the care you need, when you need it.  We recommend signing up for the patient portal called "MyChart".  Sign up information is provided on this After Visit Summary.  MyChart is used to connect with patients for Virtual Visits (Telemedicine).  Patients are able to view lab/test results, encounter notes, upcoming appointments, etc.  Non-urgent messages can be sent to your provider as well.   To learn more about what you can do with MyChart, go to [email or web address].    Your next appointment:   1 year(s)  The format for your next appointment:   In Person  Provider:   Buford Dresser, MD   Other  Instructions Goal is 150 minutes/week of walking.  Aim for average blood pressure of 140/80. If you are having a lot of high or low blood pressures, call me.     Signed, Buford Dresser, MD PhD 11/22/2020 12:37 PM   [place]

## 2020-12-05 ENCOUNTER — Other Ambulatory Visit: Payer: Medicare Other

## 2020-12-06 DIAGNOSIS — H8110 Benign paroxysmal vertigo, unspecified ear: Secondary | ICD-10-CM | POA: Diagnosis not present

## 2020-12-06 DIAGNOSIS — R269 Unspecified abnormalities of gait and mobility: Secondary | ICD-10-CM | POA: Diagnosis not present

## 2020-12-06 DIAGNOSIS — I700 Atherosclerosis of aorta: Secondary | ICD-10-CM | POA: Diagnosis not present

## 2020-12-06 DIAGNOSIS — I951 Orthostatic hypotension: Secondary | ICD-10-CM | POA: Diagnosis not present

## 2020-12-06 DIAGNOSIS — Z0000 Encounter for general adult medical examination without abnormal findings: Secondary | ICD-10-CM | POA: Diagnosis not present

## 2020-12-06 DIAGNOSIS — K5904 Chronic idiopathic constipation: Secondary | ICD-10-CM | POA: Diagnosis not present

## 2020-12-06 DIAGNOSIS — M810 Age-related osteoporosis without current pathological fracture: Secondary | ICD-10-CM | POA: Diagnosis not present

## 2020-12-06 DIAGNOSIS — I2510 Atherosclerotic heart disease of native coronary artery without angina pectoris: Secondary | ICD-10-CM | POA: Diagnosis not present

## 2020-12-06 DIAGNOSIS — E7800 Pure hypercholesterolemia, unspecified: Secondary | ICD-10-CM | POA: Diagnosis not present

## 2020-12-06 DIAGNOSIS — I10 Essential (primary) hypertension: Secondary | ICD-10-CM | POA: Diagnosis not present

## 2020-12-06 DIAGNOSIS — F341 Dysthymic disorder: Secondary | ICD-10-CM | POA: Diagnosis not present

## 2020-12-06 DIAGNOSIS — I480 Paroxysmal atrial fibrillation: Secondary | ICD-10-CM | POA: Diagnosis not present

## 2020-12-11 DIAGNOSIS — K5900 Constipation, unspecified: Secondary | ICD-10-CM | POA: Diagnosis not present

## 2020-12-14 ENCOUNTER — Other Ambulatory Visit: Payer: Self-pay | Admitting: Physician Assistant

## 2020-12-14 DIAGNOSIS — K5900 Constipation, unspecified: Secondary | ICD-10-CM

## 2020-12-14 DIAGNOSIS — R030 Elevated blood-pressure reading, without diagnosis of hypertension: Secondary | ICD-10-CM

## 2020-12-14 DIAGNOSIS — R10813 Right lower quadrant abdominal tenderness: Secondary | ICD-10-CM

## 2020-12-14 DIAGNOSIS — R10819 Abdominal tenderness, unspecified site: Secondary | ICD-10-CM | POA: Diagnosis not present

## 2020-12-15 ENCOUNTER — Ambulatory Visit
Admission: RE | Admit: 2020-12-15 | Discharge: 2020-12-15 | Disposition: A | Discharge status: Home / Self Care | Payer: Medicare Other | Source: Ambulatory Visit | Attending: Internal Medicine | Admitting: Internal Medicine

## 2020-12-15 ENCOUNTER — Other Ambulatory Visit: Payer: Medicare Other

## 2020-12-15 ENCOUNTER — Ambulatory Visit
Admission: RE | Admit: 2020-12-15 | Discharge: 2020-12-15 | Disposition: A | Discharge status: Home / Self Care | Payer: Medicare Other | Source: Ambulatory Visit | Attending: Physician Assistant | Admitting: Physician Assistant

## 2020-12-15 DIAGNOSIS — K7689 Other specified diseases of liver: Secondary | ICD-10-CM | POA: Diagnosis not present

## 2020-12-15 DIAGNOSIS — R10813 Right lower quadrant abdominal tenderness: Secondary | ICD-10-CM

## 2020-12-15 DIAGNOSIS — R29898 Other symptoms and signs involving the musculoskeletal system: Secondary | ICD-10-CM | POA: Diagnosis not present

## 2020-12-15 DIAGNOSIS — R030 Elevated blood-pressure reading, without diagnosis of hypertension: Secondary | ICD-10-CM

## 2020-12-15 DIAGNOSIS — K5900 Constipation, unspecified: Secondary | ICD-10-CM

## 2020-12-15 DIAGNOSIS — R269 Unspecified abnormalities of gait and mobility: Secondary | ICD-10-CM

## 2020-12-15 DIAGNOSIS — R531 Weakness: Secondary | ICD-10-CM | POA: Diagnosis not present

## 2020-12-15 DIAGNOSIS — N132 Hydronephrosis with renal and ureteral calculous obstruction: Secondary | ICD-10-CM | POA: Diagnosis not present

## 2020-12-15 DIAGNOSIS — S22080A Wedge compression fracture of T11-T12 vertebra, initial encounter for closed fracture: Secondary | ICD-10-CM | POA: Diagnosis not present

## 2020-12-15 DIAGNOSIS — N281 Cyst of kidney, acquired: Secondary | ICD-10-CM | POA: Diagnosis not present

## 2020-12-15 MED ORDER — IOPAMIDOL (ISOVUE-300) INJECTION 61%
100.0000 mL | Freq: Once | INTRAVENOUS | Status: AC | PRN
  Administered 2020-12-15: 100 mL via INTRAVENOUS

## 2020-12-18 DIAGNOSIS — N200 Calculus of kidney: Secondary | ICD-10-CM | POA: Diagnosis not present

## 2020-12-18 DIAGNOSIS — N132 Hydronephrosis with renal and ureteral calculous obstruction: Secondary | ICD-10-CM | POA: Diagnosis not present

## 2020-12-20 ENCOUNTER — Other Ambulatory Visit: Payer: Self-pay | Admitting: Urology

## 2020-12-20 DIAGNOSIS — Z20822 Contact with and (suspected) exposure to covid-19: Secondary | ICD-10-CM | POA: Diagnosis not present

## 2020-12-20 DIAGNOSIS — Z87442 Personal history of urinary calculi: Secondary | ICD-10-CM | POA: Diagnosis not present

## 2020-12-20 DIAGNOSIS — N132 Hydronephrosis with renal and ureteral calculous obstruction: Secondary | ICD-10-CM | POA: Diagnosis not present

## 2020-12-20 DIAGNOSIS — N200 Calculus of kidney: Secondary | ICD-10-CM

## 2020-12-20 DIAGNOSIS — Z87440 Personal history of urinary (tract) infections: Secondary | ICD-10-CM | POA: Diagnosis not present

## 2020-12-20 LAB — SARS CORONAVIRUS 2 BY RT PCR (HOSPITAL ORDER, PERFORMED IN ~~LOC~~ HOSPITAL LAB): SARS Coronavirus 2: NEGATIVE

## 2020-12-20 NOTE — H&P (Signed)
Office Visit Report     12/18/2020   --------------------------------------------------------------------------------   [name]  MRN: [id number]  DOB: July 22, 1941, 80 year old Female  SSN: [id number]   PRIMARY CARE:  Wenda Low, MD  REFERRINGSimone Curia D. Claudia Desanctis, MD  PROVIDER:  Jacalyn Lefevre, M.D.  LOCATION:  [facility] [phone]     --------------------------------------------------------------------------------   CC/HPI: cc: frequent UTIs, urinary freq/urgency   11/04/19: 80 year old woman comes in with frequent UTIs as well as urinary frequency, urgency and urge incontinence. Patient has been treated for UTI most recently a few weeks ago. She does not have dysuria or gross hematuria but has had some microscopic hematuria it during these UTIs. Patient has tried Detrol in the past for her urinary frequency and urgency which worked but she had significant side effects. Patient denies tobacco history. She does have a remote history of kidney stones.   12/18/2020: 80 year old woman found have an 11 mm left UPJ calculus on CT of abdomen pelvis on 12/15/2020. She denies any nausea, vomiting, fever chills. She continues to have intermittent left flank/back pain. She has been treated for possible UTI with Cipro. She takes baby aspirin.     ALLERGIES: No Allergies    MEDICATIONS: Advil  Atorvastatin Calcium  Boron  Losartan Potassium  Magnesium  Vitamin D3     GU PSH: Ovary Removal Partial or Total - 2009       PSH Notes: Oophorectomy, Parathyroid Surgery, Wrist Surgery   NON-GU PSH: Appendectomy (laparoscopic) Parathyroidectomy     GU PMH: Urinary Frequency - 11/04/2019 Urinary Urgency, I gave patient a sample of Myrbetriq 25 mg daily. Her PVR today was 120 mL. I told her that if she was unable to empty her bladder we need to stop the medication. - 11/04/2019 Acute Cystitis/UTI, Acute Cystitis - 2014 History of urolithiasis, Nephrolithiasis -  2014 Mixed incontinence, Urge And Stress Incontinence - 2014 Personal Hx Oth Urinary System diseases, History of chronic cystitis - 2014, History of chronic cystitis, - 2014 Renal calculus, Kidney stone on left side - 2014 Renal cyst, Renal cyst, acquired, left - 2014 Urinary Tract Inf, Unspec site, Pyuria - 2014      PMH Notes:  1898-11-18 00:00:00 - Note: Normal Routine History And Physical Senior Citizen (65-80)   NON-GU PMH: Anxiety, Anxiety - 2014 Personal history of other diseases of the nervous system and sense organs, History of glaucoma - 2014 Personal history of other endocrine, nutritional and metabolic disease, History of hyperparathyroidism - 2014 Arthritis Hypercholesterolemia Hypertension    FAMILY HISTORY: Death In The Family Father - Runs In Family Death In The Family Mother - Runs In Family Family Health Status Number - [place] In Family Heart Disease - Father, Mother Rheumatic Fever - Mother    Notes: 3 sons   SOCIAL HISTORY: Marital Status: Married Preferred Language: English; Ethnicity: Not Hispanic Or Latino; Race: White Current Smoking Status: Patient has never smoked.   Tobacco Use Assessment Completed: Used Tobacco in last 30 days? Drinks 1 drink per day.  Drinks 1 caffeinated drink per day.     Notes: Occupation:, Tobacco Use, Caffeine Use, Marital History - Currently Married, Alcohol Use   REVIEW OF SYSTEMS:    GU Review Female:   Patient denies frequent urination, hard to postpone urination, burning /pain with urination, get up at night to urinate, leakage of urine, stream starts and stops, trouble starting your stream, have to strain to urinate, and being pregnant.  Gastrointestinal (Upper):  Patient denies nausea, vomiting, and indigestion/ heartburn.  Gastrointestinal (Lower):   Patient denies diarrhea and constipation.  Constitutional:   Patient denies fever, night sweats, weight loss, and fatigue.  Skin:   Patient denies skin rash/ lesion and  itching.  Eyes:   Patient denies blurred vision and double vision.  Ears/ Nose/ Throat:   Patient denies sinus problems and sore throat.  Hematologic/Lymphatic:   Patient denies swollen glands and easy bruising.  Cardiovascular:   Patient denies leg swelling and chest pains.  Respiratory:   Patient denies cough and shortness of breath.  Endocrine:   Patient denies excessive thirst.  Musculoskeletal:   Patient denies back pain and joint pain.  Neurological:   Patient denies headaches and dizziness.  Psychologic:   Patient denies depression and anxiety.   VITAL SIGNS:      12/18/2020 12:38 PM  Weight 110 lb / 49.9 kg  Height 59 in / 149.86 cm  BP 138/85 mmHg  Pulse 64 /min  Temperature 97.3 F / 36.2 C  BMI 22.2 kg/m   GU PHYSICAL EXAMINATION:      Notes: No CVA tenderness   MULTI-SYSTEM PHYSICAL EXAMINATION:    Constitutional: Well-nourished. No physical deformities. Normally developed. Good grooming.  Neck: Neck symmetrical, not swollen. Normal tracheal position.  Respiratory: No labored breathing, no use of accessory muscles.   Skin: No paleness, no jaundice, no cyanosis. No lesion, no ulcer, no rash.  Neurologic / Psychiatric: Oriented to time, oriented to place, oriented to person. No depression, no anxiety, no agitation.  Gastrointestinal: No tenderness, no rigidity, non obese abdomen.   Eyes: Normal conjunctivae. Normal eyelids.  Ears, Nose, Mouth, and Throat: Left ear no scars, no lesions, no masses. Right ear no scars, no lesions, no masses. Nose no scars, no lesions, no masses. Normal hearing. Normal lips.  Musculoskeletal: Normal gait and station of head and neck.     Complexity of Data:  Records Review:   Previous Patient Records, POC Tool  Urine Test Review:   Urinalysis  X-Ray Review: C.T. Abdomen/Pelvis: Reviewed Films. Reviewed Report. Discussed With Patient.    Notes:                     IMPRESSION:  1. Moderate to marked left hydronephrosis, secondary to a 10  x 11 mm  left UPJ stone. Mild urothelial enhancement of the left renal pelvis  and proximal ureter, could be secondary to infection or  inflammation.  2. Cardiomegaly.   These results will be called to the ordering clinician or  representative by the Radiologist Assistant, and communication  documented in the PACS or [employer].   Aortic Atherosclerosis (ICD10-I70.0).   12/14/2020: BUN 16, creatinine 0.96   PROCEDURES:          Urinalysis w/Scope Dipstick Dipstick Cont'd Micro  Color: Yellow Bilirubin: Neg mg/dL WBC/hpf: 10 - 20/hpf  Appearance: Slightly Cloudy Ketones: Neg mg/dL RBC/hpf: 3 - 10/hpf  Specific Gravity: 1.025 Blood: Trace ery/uL Bacteria: Few (10-25/hpf)  pH: 6.0 Protein: Trace mg/dL Cystals: NS (Not Seen)  Glucose: Neg mg/dL Urobilinogen: 0.2 mg/dL Casts: NS (Not Seen)    Nitrites: Neg Trichomonas: Not Present    Leukocyte Esterase: 2+ leu/uL Mucous: Not Present      Epithelial Cells: 0 - 5/hpf      Yeast: NS (Not Seen)      Sperm: Not Present    ASSESSMENT:      ICD-10 Details  1 GU:   Renal calculus -  N20.0 Chronic, Worsening  2   Ureteral obstruction secondary to calculous - K99.8 Acute, Uncomplicated   PLAN:           Document Letter(s):  Created for Patient: Clinical Summary         Notes:   Left UPJ calculus was easily seen on CT scout film. Discussed options for management of stone including observation, ESWL, and ureteroscopy with laser lithotripsy stent placement. Patient would like to proceed with noninvasive measures and has elected to undergo ESWL. She has not taken her baby aspirin today and I have told her not to take any blood thinners including Voltaren gel. She will remain on Cipro until the procedure. Risks and benefits of the procedure were discussed with the patient and she has elected to proceed. These include but are not limited to bleeding, pain, UTI, hematoma, damage to the kidney, postprocedural hypertension, inability to break  up stone, need for 2nd procedure, extender. Patient to be scheduled for next available ESWL date.   cc: Wenda Low, MD    * Signed by Jacalyn Lefevre, M.D. on 12/18/20 at 2:10 PM (EST)*     The information contained in this medical record document is considered private and confidential patient information. This information can only be used for the medical diagnosis and/or medical services that are being provided by the patient's selected caregivers. This information can only be distributed outside of the patient's care if the patient agrees and signs waivers of authorization for this information to be sent to an outside source or route.

## 2020-12-20 NOTE — Progress Notes (Signed)
Patient to arrive at [facility] on 12/21/20. Patient lives at [facility] independent living. They will bring her for covid swab today. History and medications reviewed with patient and [facility] nurse, Patient stopped her ASA on 12/18/20. NPO after MN tomorrow except for clear liquids until 0445. Losartan with sip of water in am. Driver and home care secured.

## 2020-12-21 ENCOUNTER — Encounter (HOSPITAL_BASED_OUTPATIENT_CLINIC_OR_DEPARTMENT_OTHER)
Admission: RE | Disposition: A | Discharge status: Home / Self Care | Payer: Self-pay | Source: Home / Self Care | Attending: Urology

## 2020-12-21 ENCOUNTER — Other Ambulatory Visit: Payer: Self-pay

## 2020-12-21 ENCOUNTER — Ambulatory Visit (HOSPITAL_BASED_OUTPATIENT_CLINIC_OR_DEPARTMENT_OTHER)
Admission: RE | Admit: 2020-12-21 | Discharge: 2020-12-21 | Disposition: A | Discharge status: Home / Self Care | Payer: Medicare Other | Attending: Urology | Admitting: Urology

## 2020-12-21 ENCOUNTER — Encounter (HOSPITAL_BASED_OUTPATIENT_CLINIC_OR_DEPARTMENT_OTHER): Payer: Self-pay | Admitting: Urology

## 2020-12-21 ENCOUNTER — Ambulatory Visit (HOSPITAL_COMMUNITY): Payer: Medicare Other

## 2020-12-21 DIAGNOSIS — Z87440 Personal history of urinary (tract) infections: Secondary | ICD-10-CM | POA: Insufficient documentation

## 2020-12-21 DIAGNOSIS — Z01818 Encounter for other preprocedural examination: Secondary | ICD-10-CM | POA: Diagnosis not present

## 2020-12-21 DIAGNOSIS — N132 Hydronephrosis with renal and ureteral calculous obstruction: Secondary | ICD-10-CM | POA: Insufficient documentation

## 2020-12-21 DIAGNOSIS — Z87442 Personal history of urinary calculi: Secondary | ICD-10-CM | POA: Diagnosis not present

## 2020-12-21 DIAGNOSIS — N2889 Other specified disorders of kidney and ureter: Secondary | ICD-10-CM | POA: Diagnosis not present

## 2020-12-21 DIAGNOSIS — N200 Calculus of kidney: Secondary | ICD-10-CM | POA: Diagnosis not present

## 2020-12-21 DIAGNOSIS — Z20822 Contact with and (suspected) exposure to covid-19: Secondary | ICD-10-CM | POA: Diagnosis not present

## 2020-12-21 HISTORY — PX: EXTRACORPOREAL SHOCK WAVE LITHOTRIPSY: SHX1557

## 2020-12-21 HISTORY — DX: Nausea with vomiting, unspecified: R11.2

## 2020-12-21 HISTORY — DX: Other complications of anesthesia, initial encounter: T88.59XA

## 2020-12-21 HISTORY — DX: Other specified postprocedural states: Z98.890

## 2020-12-21 SURGERY — LITHOTRIPSY, ESWL
Anesthesia: LOCAL | Laterality: Left

## 2020-12-21 MED ORDER — SODIUM CHLORIDE 0.9 % IV SOLN: INTRAVENOUS | Status: DC

## 2020-12-21 MED ORDER — CIPROFLOXACIN HCL 500 MG PO TABS
500.0000 mg | ORAL_TABLET | ORAL | Status: AC
  Administered 2020-12-21: 500 mg via ORAL

## 2020-12-21 MED ORDER — CIPROFLOXACIN HCL 500 MG PO TABS
ORAL_TABLET | ORAL | Status: AC
  Filled 2020-12-21: qty 1

## 2020-12-21 MED ORDER — DIAZEPAM 5 MG PO TABS
10.0000 mg | ORAL_TABLET | ORAL | Status: AC
  Administered 2020-12-21: 10 mg via ORAL

## 2020-12-21 MED ORDER — ASPIRIN 81 MG PO TABS: 81.0000 mg | ORAL_TABLET | Freq: Every evening | ORAL | Status: DC

## 2020-12-21 MED ORDER — DIPHENHYDRAMINE HCL 25 MG PO CAPS
25.0000 mg | ORAL_CAPSULE | ORAL | Status: AC
  Administered 2020-12-21: 25 mg via ORAL

## 2020-12-21 MED ORDER — DIAZEPAM 5 MG PO TABS
ORAL_TABLET | ORAL | Status: AC
  Filled 2020-12-21: qty 2

## 2020-12-21 MED ORDER — DIPHENHYDRAMINE HCL 25 MG PO CAPS
ORAL_CAPSULE | ORAL | Status: AC
  Filled 2020-12-21: qty 1

## 2020-12-21 NOTE — Op Note (Signed)
Left 11 mm UPJ stone  Left ESWL   Findings: Pt tolerated well. Stone fragmented well, but she may need a staged procedure if she fails to pass the stone fragments. Discussed with son Hal.

## 2020-12-21 NOTE — Discharge Instructions (Signed)

## 2020-12-21 NOTE — Interval H&P Note (Signed)
History and Physical Interval Note:  12/21/2020 8:55 AM  Esaw Dace  has presented today for surgery, with the diagnosis of LEFT RENAL CALCULUS.  The various methods of treatment have been discussed with the patient and family. After consideration of risks, benefits and other options for treatment, the patient has consented to  Procedure(s): EXTRACORPOREAL SHOCK WAVE LITHOTRIPSY (ESWL) (Left) as a surgical intervention.  The patient's history has been reviewed, patient examined, no change in status, stable for surgery.  I have reviewed the patient's chart and labs.  Questions were answered to the patient's satisfaction.  She has not had dysuria or fever.    Festus Aloe

## 2020-12-22 ENCOUNTER — Encounter (HOSPITAL_BASED_OUTPATIENT_CLINIC_OR_DEPARTMENT_OTHER): Payer: Self-pay | Admitting: Urology

## 2020-12-24 ENCOUNTER — Encounter: Payer: Self-pay | Admitting: Internal Medicine

## 2020-12-24 DIAGNOSIS — N390 Urinary tract infection, site not specified: Secondary | ICD-10-CM | POA: Diagnosis not present

## 2020-12-25 ENCOUNTER — Non-Acute Institutional Stay (SKILLED_NURSING_FACILITY): Payer: Medicare Other | Admitting: Adult Health

## 2020-12-25 ENCOUNTER — Encounter: Payer: Self-pay | Admitting: Adult Health

## 2020-12-25 ENCOUNTER — Observation Stay: Admission: AD | Admit: 2020-12-25 | Payer: Medicare Other | Source: Ambulatory Visit | Admitting: Urology

## 2020-12-25 DIAGNOSIS — S22080A Wedge compression fracture of T11-T12 vertebra, initial encounter for closed fracture: Secondary | ICD-10-CM | POA: Diagnosis not present

## 2020-12-25 DIAGNOSIS — R531 Weakness: Secondary | ICD-10-CM | POA: Diagnosis not present

## 2020-12-25 DIAGNOSIS — N3000 Acute cystitis without hematuria: Secondary | ICD-10-CM | POA: Diagnosis not present

## 2020-12-25 DIAGNOSIS — E860 Dehydration: Secondary | ICD-10-CM

## 2020-12-25 DIAGNOSIS — R339 Retention of urine, unspecified: Secondary | ICD-10-CM | POA: Diagnosis not present

## 2020-12-25 DIAGNOSIS — K5730 Diverticulosis of large intestine without perforation or abscess without bleeding: Secondary | ICD-10-CM | POA: Diagnosis not present

## 2020-12-25 DIAGNOSIS — N200 Calculus of kidney: Secondary | ICD-10-CM

## 2020-12-25 DIAGNOSIS — N132 Hydronephrosis with renal and ureteral calculous obstruction: Secondary | ICD-10-CM | POA: Diagnosis not present

## 2020-12-25 NOTE — Progress Notes (Addendum)
Location:   [facility] Room Number: 153-A Place of Service:  SNF [phone]) Provider:  Royal Hawthorn, NP     Patient Care Team: Lanice Shirts, MD as PCP - General (Internal Medicine) Buford Dresser, MD as PCP - Cardiology (Cardiology) Martin[place], Amy, MD (Dermatology) [place], [place] Eduardo Osier., MD (Urology) Leighton Ruff, MD as Consulting Physician (General Surgery) Mauri Pole, MD as Consulting Physician (Gastroenterology)  No emergency contact information on file.  Code Status:  FULL CODE Goals of care: Advanced Directive information Advanced Directives 12/25/2020  Does Patient Have a Medical Advance Directive? Yes  Type of Advance Directive Living will  Does patient want to make changes to medical advance directive? No - Patient declined  Copy of [place] in Chart? -  Would patient like information on creating a medical advance directive? -     Chief Complaint  Patient presents with  . Acute Visit    UTI    HPI:  Pt is a 80 y.o. female seen today for an acute visit for UTI. Ms. Stachowski was admitted to rehab on 12/21/20 s/p ESWL of left UPJ calculus.  During her stay her blood pressure was slightly elevated due to pain but she was discharged after improvement.  Then she started not feeling well and having weakness and fever and came back to the rehab area on 12/24/20.  CBC was ordered which revealed a WBC of 12.9 with ANC 11.9.  BMP was unremarkable BUN 14 Cr 0.77.  UA: bacteria TNTC, leuk esterase 500 WBC 286 Blood +1. She was started on Cipro and received 1 dose. She went to see urology this morning with a temp of 102.7 and there was concern that she needed to be sent to the ER. NP Rosana Hoes from the urology office called to coordinate her care to see if we could treat her in rehab area and avoid hospitalization.   Ms. Zupko reports bladder pain, weakness, and decreased appetite. Due to weakness she is not walking  and needing more assistance.   During her urology visit the NP reported the renal CT done in the office showed no obstruction on the left side. Bladder scan revealed 400. The stones are broken up and a few small stones were passed this morning.    BP now 106/58   Rapid covid negative 12/25/20  Past Medical History:  Diagnosis Date  . Abdominal pain, unspecified site   . ADVERSE REACTION TO MEDICATION 07/31/2009  . ANEMIA-NOS 07/07/2007  . Arthritis   . Chest pain, unspecified   . Chronic kidney disease    hx of kidney stone  . COLONIC POLYPS, ADENOMATOUS, HX OF   . Complication of anesthesia   . Disturbance of skin sensation   . DIVERTICULOSIS, COLON 02/23/2009  . Family history of diabetes mellitus   . Family history of malignant neoplasm of breast   . FATIGUE 05/25/2009  . Fever, unspecified   . Heart murmur    hx of  . HYPERLIPIDEMIA 07/07/2007  . HYPERPARATHYROIDISM UNSPECIFIED 10/20/2007  . HYPERTENSION 02/04/2008  . Irritable bowel syndrome (IBS)   . NEPHROLITHIASIS, HX OF 11/15/2008  . OSTEOPOROSIS 01/01/2008  . Other acquired absence of organ    parathyroidectomy  . Pain in limb   . PALPITATIONS, OCCASIONAL 07/12/2008  . PARATHYROIDECTOMY 01/02/2009  . PONV (postoperative nausea and vomiting)   . SCOLIOSIS 05/25/2009  . Seasonal allergies   . Unspecified constipation   . URINARY INCONTINENCE 07/07/2007  . UTI'S,  RECURRENT 11/20/2009   kimbrough    Past Surgical History:  Procedure Laterality Date  . BUNIONECTOMY    . COLONOSCOPY    . EXTRACORPOREAL SHOCK WAVE LITHOTRIPSY Left 12/21/2020   Procedure: EXTRACORPOREAL SHOCK WAVE LITHOTRIPSY (ESWL);  Surgeon: Festus Aloe, MD;  Location: [facility];  Service: Urology;  Laterality: Left;  . LAPAROSCOPIC APPENDECTOMY  01/19/2016   Procedure: APPENDECTOMY LAPAROSCOPIC with orifice of cecal polyp;  Surgeon: Leighton Ruff, MD;  Location: [facility];  Service: General;;  . LEFT HEART CATH AND CORONARY ANGIOGRAPHY N/A  04/07/2017   Procedure: Left Heart Cath and Coronary Angiography;  Surgeon: Nelva Bush, MD;  Location: [facility];  Service: Cardiovascular;  Laterality: N/A;  . PARATHYROIDECTOMY     Rt Superior open neck exploration  . POLYPECTOMY    . RIGHT OOPHORECTOMY    . TONSILLECTOMY      Allergies  Allergen Reactions  . Anesthetics, [place] Other (See Comments)    Pt states she had hallucinations and HTN after procedure 01/19/16-she feels may have been reaction to anesthetic  . Lisinopril Cough    Allergies as of 12/25/2020      Reactions   Anesthetics, [place] Other (See Comments)   Pt states she had hallucinations and HTN after procedure 01/19/16-she feels may have been reaction to anesthetic   Lisinopril Cough      Medication List       Accurate as of December 25, 2020 10:02 AM. If you have any questions, ask your nurse or doctor.        STOP taking these medications   aspirin 81 MG tablet Stopped by: Royal Hawthorn, NP   atorvastatin 10 MG tablet Commonly known as: LIPITOR Stopped by: Royal Hawthorn, NP   denosumab 60 MG/ML Soln injection Commonly known as: PROLIA Stopped by: Royal Hawthorn, NP   diphenhydramine-acetaminophen 25-500 MG Tabs tablet Commonly known as: TYLENOL PM Stopped by: Royal Hawthorn, NP   escitalopram 5 MG tablet Commonly known as: LEXAPRO Stopped by: Royal Hawthorn, NP   Magnesium 400 MG Tabs Stopped by: Royal Hawthorn, NP   Vitamin D3 125 MCG (5000 UT) Caps Stopped by: Royal Hawthorn, NP     TAKE these medications   acetaminophen 325 MG tablet Commonly known as: TYLENOL Take 650 mg by mouth every 4 (four) hours as needed.   ciprofloxacin 500 MG tablet Commonly known as: CIPRO Take 500 mg by mouth 2 (two) times daily.   diazepam 2 MG tablet Commonly known as: VALIUM 2 mg at bedtime as needed.   losartan 50 MG tablet Commonly known as: COZAAR Take 25 mg by mouth 2 (two) times daily. What changed: Another medication with the  same name was removed. Continue taking this medication, and follow the directions you see here. Changed by: Royal Hawthorn, NP   ondansetron 4 MG tablet Commonly known as: ZOFRAN Take 4 mg by mouth every 6 (six) hours as needed for nausea or vomiting.       Review of Systems  Constitutional: Positive for activity change, appetite change, chills, fatigue and fever. Negative for diaphoresis and unexpected weight change.  HENT: Negative for congestion.   Respiratory: Negative for cough, shortness of breath and wheezing.   Cardiovascular: Negative for chest pain, palpitations and leg swelling.  Gastrointestinal: Negative for abdominal distention, abdominal pain, constipation and diarrhea.  Genitourinary: Positive for decreased urine volume, difficulty urinating, dysuria, frequency and pelvic pain. Negative for flank pain, hematuria, menstrual problem and urgency.  Musculoskeletal: Negative for  arthralgias, back pain, gait problem, joint swelling and myalgias.  Skin: Positive for pallor.  Neurological: Positive for weakness. Negative for dizziness, tremors, seizures, syncope, facial asymmetry, speech difficulty, light-headedness, numbness and headaches.  Psychiatric/Behavioral: Negative for agitation, behavioral problems and confusion.    Immunization History  Administered Date(s) Administered  . Influenza Split 09/17/2011, 11/08/2015, 09/12/2017, 08/02/2020  . Influenza Whole 08/19/2008  . Influenza, High Dose Seasonal PF 08/20/2016, 09/12/2017, 08/21/2018, 09/28/2019  . Influenza-Unspecified 09/18/2013, 08/18/2014, 11/08/2015, 09/15/2020  . Moderna Sars-Covid-2 Vaccination 11/30/2019, 12/31/2019, 10/26/2020  . Pneumococcal Conjugate-13 12/29/2013  . Pneumococcal Polysaccharide-23 11/08/2015  . Tdap 01/05/2015   Pertinent  Health Maintenance Due  Topic Date Due  . COLONOSCOPY (Pts 45-10yrs Insurance coverage will need to be confirmed)  06/04/2019  . INFLUENZA VACCINE  Completed  .  DEXA SCAN  Completed  . PNA vac Low Risk Adult  Completed   Fall Risk  01/05/2015 12/29/2013  Falls in the past year? No No   Functional Status Survey:    Vitals:   12/25/20 0954  BP: (!) 106/58  Pulse: 63  Resp: 18  Temp: (!) 102.7 F (39.3 C)  SpO2: 92%  Weight: 109 lb (49.4 kg)  Height: 5' (1.524 m)   Body mass index is 21.29 kg/m. Physical Exam Vitals and nursing note reviewed.  Constitutional:      General: She is not in acute distress.    Appearance: She is not diaphoretic.  HENT:     Head: Normocephalic and atraumatic.     Mouth/Throat:     Mouth: Mucous membranes are moist.     Pharynx: Oropharynx is clear.  Eyes:     Conjunctiva/sclera: Conjunctivae normal.     Pupils: Pupils are equal, round, and reactive to light.  Neck:     Vascular: No JVD.  Cardiovascular:     Rate and Rhythm: Normal rate and regular rhythm.     Heart sounds: No murmur heard.   Pulmonary:     Effort: Pulmonary effort is normal. No respiratory distress.     Breath sounds: Normal breath sounds. No wheezing.  Abdominal:     General: Abdomen is flat. Bowel sounds are normal. There is no distension.     Palpations: Abdomen is soft. There is no mass.     Tenderness: There is abdominal tenderness. There is no right CVA tenderness, left CVA tenderness, guarding or rebound.     Hernia: No hernia is present.  Musculoskeletal:     Right lower leg: No edema.     Left lower leg: No edema.  Skin:    General: Skin is warm and dry.     Coloration: Skin is pale.  Neurological:     Mental Status: She is alert and oriented to person, place, and time.  Psychiatric:        Mood and Affect: Mood normal.     Labs reviewed: No results for input(s): NA, K, CL, CO2, GLUCOSE, BUN, CREATININE, CALCIUM, MG, PHOS in the last 8760 hours. No results for input(s): AST, ALT, ALKPHOS, BILITOT, PROT, ALBUMIN in the last 8760 hours. No results for input(s): WBC, NEUTROABS, HGB, HCT, MCV, PLT in the last 8760  hours. Lab Results  Component Value Date   TSH 2.808 02/09/2017   No results found for: HGBA1C Lab Results  Component Value Date   CHOL 132 01/25/2015   HDL 68 01/25/2015   LDLCALC 51 01/25/2015   LDLDIRECT 156.3 03/28/2011   TRIG 64 01/25/2015   CHOLHDL 1.9 01/25/2015  Significant Diagnostic Results in last 30 days:  DG Abd 1 View  Result Date: 12/21/2020 CLINICAL DATA:  Preop lithotripsy EXAM: ABDOMEN - 1 VIEW COMPARISON:  CT 12/15/2020 FINDINGS: 10 mm calcification projects over the mid pole of the left kidney. This appears located more lateral than on prior CT and may have migrated back into the left kidney. Nonobstructive bowel gas pattern. No organomegaly or free air. IMPRESSION: 10 mm calcification in the left abdomen appears more laterally located than on prior CT and may have migrated back into the left kidney. Electronically Signed   By: Rolm Baptise M.D.   On: 12/21/2020 12:42   CT HEAD WO CONTRAST  Result Date: 12/15/2020 CLINICAL DATA:  Abnormality of gait. Additional history provided by scanning technologist: Patient reports elevated blood pressure, right-sided weakness, symptoms for 1 week. EXAM: CT HEAD WITHOUT CONTRAST TECHNIQUE: Contiguous axial images were obtained from the base of the skull through the vertex without intravenous contrast. COMPARISON:  Head CT 12/18/2017. FINDINGS: Brain: Mild cerebral and cerebellar atrophy. Mild ill-defined hypoattenuation within the cerebral white matter is nonspecific, but compatible with chronic small vessel ischemic disease. There is no acute intracranial hemorrhage. No demarcated cortical infarct. No extra-axial fluid collection. No evidence of intracranial mass. No midline shift. Vascular: No hyperdense vessel.  Atherosclerotic calcifications. Skull: Normal. Negative for fracture or focal lesion. Sinuses/Orbits: Visualized orbits show no acute finding. Mild mucosal thickening and small volume frothy secretions within the left  sphenoid sinus. IMPRESSION: No evidence of acute intracranial abnormality. Mild generalized atrophy of the brain and cerebral white matter chronic small vessel ischemic disease, stable as compared to the head CT of 12/18/2017. Left sphenoid sinusitis. Electronically Signed   By: Kellie Simmering DO   On: 12/15/2020 16:55   CT ABDOMEN PELVIS W CONTRAST  Result Date: 12/15/2020 CLINICAL DATA:  Lower abdominal tenderness EXAM: CT ABDOMEN AND PELVIS WITH CONTRAST TECHNIQUE: Multidetector CT imaging of the abdomen and pelvis was performed using the standard protocol following bolus administration of intravenous contrast. CONTRAST:  110mL ISOVUE-300 IOPAMIDOL (ISOVUE-300) INJECTION 61% COMPARISON:  CT 09/08/2019 FINDINGS: Lower chest: Lung bases demonstrate mild bronchiectasis in the right lower lobe with atelectasis. No pleural effusion. Cardiomegaly. Hepatobiliary: Subcentimeter hypodensity in the liver too small to further characterize. No calcified gallstone or biliary dilatation Pancreas: Unremarkable. No pancreatic ductal dilatation or surrounding inflammatory changes. Spleen: Normal in size without focal abnormality. Adrenals/Urinary Tract: Adrenal glands are normal. Small exophytic cyst off the mid left kidney. Moderate to marked left hydronephrosis, secondary to a 10 by 11 mm left UPJ stone. Mild urothelial enhancement left renal pelvis and proximal ureter. No distal ureteral stone. Bladder is unremarkable Stomach/Bowel: The stomach is nonenlarged. There is no dilated small bowel. No acute colon wall thickening. Probable prior appendectomy. Vascular/Lymphatic: Moderate aortic atherosclerosis without aneurysm. No suspicious nodes. Reproductive: Uterus and bilateral adnexa are unremarkable. Other: Negative for free air or free fluid. Musculoskeletal: Scoliosis and degenerative changes of the spine. Severe chronic compression fracture T12. IMPRESSION: 1. Moderate to marked left hydronephrosis, secondary to a 10 x 11  mm left UPJ stone. Mild urothelial enhancement of the left renal pelvis and proximal ureter, could be secondary to infection or inflammation. 2. Cardiomegaly. These results will be called to the ordering clinician or representative by the Radiologist Assistant, and communication documented in the PACS or [employer]. Aortic Atherosclerosis (ICD10-I70.0). Electronically Signed   By: Donavan Foil M.D.   On: 12/15/2020 22:55    Assessment/Plan  1. Acute cystitis without  hematuria Cefepime 1 gram q 12 x 7 days stat from pharmacy D/C cipro as prior culture shows resistance Will taylor therapy once final culture returns Monitor VS and for s/s of sepsis.   2. Left renal stone S/p ESWL per urology  3. Weakness Due to infection and dehydration  Will likely improve with fluids and antibiotics but if not may need PT  4. Dehydration NS 80 cc/hr x 2 liters Hold losartan due to low bp  5. Urinary retention PVR with bladder scanner qshift, if greater than or = to 400 cc I/O cath. If cath 2 x in 24 hrs leave cath in    Family/ staff Communication: discussed with resident and her son Hal   Labs/tests ordered:  CBC BMP in am

## 2020-12-26 ENCOUNTER — Non-Acute Institutional Stay (SKILLED_NURSING_FACILITY): Payer: Medicare Other | Admitting: Internal Medicine

## 2020-12-26 ENCOUNTER — Encounter: Payer: Self-pay | Admitting: Internal Medicine

## 2020-12-26 DIAGNOSIS — M810 Age-related osteoporosis without current pathological fracture: Secondary | ICD-10-CM | POA: Diagnosis not present

## 2020-12-26 DIAGNOSIS — N3000 Acute cystitis without hematuria: Secondary | ICD-10-CM

## 2020-12-26 DIAGNOSIS — R339 Retention of urine, unspecified: Secondary | ICD-10-CM

## 2020-12-26 DIAGNOSIS — R531 Weakness: Secondary | ICD-10-CM | POA: Diagnosis not present

## 2020-12-26 DIAGNOSIS — I2510 Atherosclerotic heart disease of native coronary artery without angina pectoris: Secondary | ICD-10-CM | POA: Diagnosis not present

## 2020-12-26 DIAGNOSIS — E860 Dehydration: Secondary | ICD-10-CM

## 2020-12-26 DIAGNOSIS — N200 Calculus of kidney: Secondary | ICD-10-CM | POA: Diagnosis not present

## 2020-12-26 DIAGNOSIS — N390 Urinary tract infection, site not specified: Secondary | ICD-10-CM | POA: Diagnosis not present

## 2020-12-26 NOTE — Progress Notes (Signed)
Provider:  Rexene Edison. Mariea Clonts, D.O., C.M.D. Location:  [facility] Room Number: 153 A Place of Service:  SNF (31)  PCP: Lanice Shirts, MD Patient Care Team: Lanice Shirts, MD as PCP - General (Internal Medicine) Buford Dresser, MD as PCP - Cardiology (Cardiology) Martinique, Amy, MD (Dermatology) [place], [place] Eduardo Osier., MD (Urology) Leighton Ruff, MD as Consulting Physician (General Surgery) Mauri Pole, MD as Consulting Physician (Gastroenterology)  No emergency contact information on file.  Code Status: FULL CODE Goals of Care: Advanced Directive information Advanced Directives 12/25/2020  Does Patient Have a Medical Advance Directive? Yes  Type of Advance Directive Living will  Does patient want to make changes to medical advance directive? No - Patient declined  Copy of Kenneth in Chart? -  Would patient like information on creating a medical advance directive? -   Chief Complaint  Patient presents with  . New Admit To SNF    New Admit to [facility]     HPI: Patient is a 80 y.o. female seen today for admission to [facility] s/p surgical procedure (ESWL of left UPJ calculus) on 12/21/20.  She has a PMH significant for osteoporosis, nephrolithiasis, hyperparathyroidism, diverticulosis, CKD, urinary incontinence and constipation, among others.  During her stay, her blood pressure was slightly elevated due to pain but improved upon discharge.  She had fever and weakness when she returned to rehab here 12/24/20.  Her WBC was 12.9 with ANC 11.9.  UA was positive.  She was started on cipro and received one dose.  At her urology visit, her T was 102.7 yesterday.  NP Rosana Hoes from urology contacted [facility] so we could treat her here for her infection. She had reported bladder pain, weakness and decreased appetite and therefore, has not been ambulating and needing more assistance.  Her rapid covid swab was  negative 2/7.    Yesterday, she was given cefepime 1g IV q12 x 7 days.  cipro was stopped due to resistance on culture.  She was also rehydrated with NS at 80cc/h x 2 liters and losartan held due to hypotension.  She was retaining urine so PVR with bladder scan q shift and I/O cath over 400cc.  If required more than 2x in 24 hrs, foley to be placed.  CBC and bmp were drawn this am.    When seen today, she reported feeling really fatigued.  She was concerned about her blood pressure being elevated after the meds were held for hypotension.  Her biggest complaint was no significant bm since last Wednesday (6 days) and nursing was going to give her a mix of milk of mag and prune juice as mirlax has not helped.  She had a low grade temp of 99.5 today.  She's not been drinking enough per nursing and they have noted her urine to be dark and containing stones when she was voiding in the bedside commode.    She reported that when the stones are passing, her pain is severe and she has tolerated tramadol well for severe pain in the past so she'd like to have that available.    She is grieving the loss of her husband who passed away in rehab.   Labs had not returned before I left b/c the lab had to come back to get more blood from her (only able to do cbc not bmp this am).    Past Medical History:  Diagnosis Date  . Abdominal pain, unspecified site   .  ADVERSE REACTION TO MEDICATION 07/31/2009  . ANEMIA-NOS 07/07/2007  . Arthritis   . Chest pain, unspecified   . Chronic kidney disease    hx of kidney stone  . COLONIC POLYPS, ADENOMATOUS, HX OF   . Complication of anesthesia   . Disturbance of skin sensation   . DIVERTICULOSIS, COLON 02/23/2009  . Family history of diabetes mellitus   . Family history of malignant neoplasm of breast   . FATIGUE 05/25/2009  . Fever, unspecified   . Heart murmur    hx of  . HYPERLIPIDEMIA 07/07/2007  . HYPERPARATHYROIDISM UNSPECIFIED 10/20/2007  . HYPERTENSION 02/04/2008   . Irritable bowel syndrome (IBS)   . NEPHROLITHIASIS, HX OF 11/15/2008  . OSTEOPOROSIS 01/01/2008  . Other acquired absence of organ    parathyroidectomy  . Pain in limb   . PALPITATIONS, OCCASIONAL 07/12/2008  . PARATHYROIDECTOMY 01/02/2009  . PONV (postoperative nausea and vomiting)   . SCOLIOSIS 05/25/2009  . Seasonal allergies   . Unspecified constipation   . URINARY INCONTINENCE 07/07/2007  . UTI'S, RECURRENT 11/20/2009   kimbrough    Past Surgical History:  Procedure Laterality Date  . BUNIONECTOMY    . COLONOSCOPY    . EXTRACORPOREAL SHOCK WAVE LITHOTRIPSY Left 12/21/2020   Procedure: EXTRACORPOREAL SHOCK WAVE LITHOTRIPSY (ESWL);  Surgeon: Festus Aloe, MD;  Location: [facility];  Service: Urology;  Laterality: Left;  . LAPAROSCOPIC APPENDECTOMY  01/19/2016   Procedure: APPENDECTOMY LAPAROSCOPIC with orifice of cecal polyp;  Surgeon: Leighton Ruff, MD;  Location: [facility];  Service: General;;  . LEFT HEART CATH AND CORONARY ANGIOGRAPHY N/A 04/07/2017   Procedure: Left Heart Cath and Coronary Angiography;  Surgeon: Nelva Bush, MD;  Location: [facility];  Service: Cardiovascular;  Laterality: N/A;  . PARATHYROIDECTOMY     Rt Superior open neck exploration  . POLYPECTOMY    . RIGHT OOPHORECTOMY    . TONSILLECTOMY      Social History   Socioeconomic History  . Marital status: Married    Spouse name: Not on file  . Number of children: 3  . Years of education: Not on file  . Highest education level: Not on file  Occupational History  . Occupation: travel Primary school teacher: RETIRED  Tobacco Use  . Smoking status: Never Smoker  . Smokeless tobacco: Never Used  Vaping Use  . Vaping Use: Never used  Substance and Sexual Activity  . Alcohol use: Yes    Comment: 1 bottle wine week  . Drug use: No  . Sexual activity: Never  Other Topics Concern  . Not on file  Social History Narrative   HH of 5    Kids at home now   Married   travel agent.     2-3 c coffee in am    ocass wine    G3P2vaginal delivery   Pt cell [phone]            Social Determinants of Health   Financial Resource Strain: Not on file  Food Insecurity: Not on file  Transportation Needs: Not on file  Physical Activity: Not on file  Stress: Not on file  Social Connections: Not on file    reports that she has never smoked. She has never used smokeless tobacco. She reports current alcohol use. She reports that she does not use drugs.  Functional Status Survey:    Family History  Problem Relation Age of Onset  . Sudden death Father 59  . Heart  disease Father 80       MI  . Heart disease Mother        valve replacement  . Diabetes Other        1st degree relative  . Breast cancer Paternal Aunt   . Colon cancer Neg Hx   . Stomach cancer Neg Hx     Health Maintenance  Topic Date Due  . COLONOSCOPY (Pts 45-26yrs Insurance coverage will need to be confirmed)  06/04/2019  . TETANUS/TDAP  01/05/2025  . INFLUENZA VACCINE  Completed  . DEXA SCAN  Completed  . COVID-19 Vaccine  Completed  . PNA vac Low Risk Adult  Completed    Allergies  Allergen Reactions  . Anesthetics, Amide Other (See Comments)    Pt states she had hallucinations and HTN after procedure 01/19/16-she feels may have been reaction to anesthetic  . Lisinopril Cough    Outpatient Encounter Medications as of 12/26/2020  Medication Sig  . acetaminophen (TYLENOL) 325 MG tablet Take 650 mg by mouth every 4 (four) hours as needed.  [name] ceFEPime (MAXIPIME) IVPB Inject 1 g into the vein every 12 (twelve) hours.  . diazepam (VALIUM) 2 MG tablet 2 mg at bedtime as needed.  [name] losartan (COZAAR) 50 MG tablet Take 25 mg by mouth 2 (two) times daily. (Patient not taking: Reported on 12/25/2020)  . ondansetron (ZOFRAN) 4 MG tablet Take 4 mg by mouth every 6 (six) hours as needed for nausea or vomiting.  . Sodium Chloride Flush (NORMAL SALINE FLUSH) 0.9 % SOLN Inject 80 mLs into the vein. 80/hr x 2  liters   No facility-administered encounter medications on file as of 12/26/2020.    Review of Systems  Constitutional: Positive for fever, malaise/fatigue and weight loss. Negative for chills.  HENT: Negative for congestion and sore throat.   Eyes: Negative for blurred vision.  Respiratory: Negative for cough and shortness of breath.   Cardiovascular: Negative for chest pain, palpitations and leg swelling.  Gastrointestinal: Positive for abdominal pain and constipation. Negative for blood in stool, diarrhea, heartburn, melena, nausea and vomiting.  Genitourinary: Positive for dysuria and flank pain.       Passing stones  Musculoskeletal: Negative for joint pain.  Skin: Negative for itching and rash.  Neurological: Positive for weakness. Negative for dizziness and loss of consciousness.  Endo/Heme/Allergies: Bruises/bleeds easily.  Psychiatric/Behavioral: Positive for depression and memory loss. The patient is not nervous/anxious and does not have insomnia.     There were no vitals filed for this visit. There is no height or weight on file to calculate BMI. Physical Exam Vitals reviewed.  Constitutional:      Appearance: She is ill-appearing.  HENT:     Head: Normocephalic and atraumatic.     Right Ear: External ear normal.     Left Ear: External ear normal.     Nose: Nose normal.     Mouth/Throat:     Mouth: Mucous membranes are dry.     Pharynx: Oropharynx is clear.  Eyes:     Conjunctiva/sclera: Conjunctivae normal.     Pupils: Pupils are equal, round, and reactive to light.  Cardiovascular:     Rate and Rhythm: Rhythm irregular.     Heart sounds: No murmur heard.   Pulmonary:     Effort: Pulmonary effort is normal.     Breath sounds: Normal breath sounds. No wheezing, rhonchi or rales.  Abdominal:     General: Bowel sounds are normal. There is no distension.  Palpations: Abdomen is soft. There is no mass.     Tenderness: There is abdominal tenderness. There is  left CVA tenderness. There is no right CVA tenderness, guarding or rebound.     Comments: Left sided tenderness  Musculoskeletal:        General: Normal range of motion.     Cervical back: Neck supple.     Right lower leg: No edema.     Left lower leg: No edema.  Lymphadenopathy:     Cervical: No cervical adenopathy.  Skin:    General: Skin is warm and dry.  Neurological:     Mental Status: She is alert.     Motor: Weakness present.     Gait: Gait abnormal.     Comments: Short-term memory loss, oriented to person and place; needing assistance getting up to bedside commode, was able to move in bed for exam  Psychiatric:     Comments: Sad mood     Labs reviewed: Basic Metabolic Panel: No results for input(s): NA, K, CL, CO2, GLUCOSE, BUN, CREATININE, CALCIUM, MG, PHOS in the last 8760 hours. Liver Function Tests: No results for input(s): AST, ALT, ALKPHOS, BILITOT, PROT, ALBUMIN in the last 8760 hours. No results for input(s): LIPASE, AMYLASE in the last 8760 hours. No results for input(s): AMMONIA in the last 8760 hours. CBC: No results for input(s): WBC, NEUTROABS, HGB, HCT, MCV, PLT in the last 8760 hours. Cardiac Enzymes: No results for input(s): CKTOTAL, CKMB, CKMBINDEX, TROPONINI in the last 8760 hours. BNP: Invalid input(s): POCBNP No results found for: HGBA1C Lab Results  Component Value Date   TSH 2.808 02/09/2017   Lab Results  Component Value Date   VITAMINB12 289 02/09/2017   Lab Results  Component Value Date   FOLATE 8.0 05/25/2009   No results found for: IRON, TIBC, FERRITIN  Imaging and Procedures obtained prior to SNF admission: DG Abd 1 View  Result Date: 12/21/2020 CLINICAL DATA:  Preop lithotripsy EXAM: ABDOMEN - 1 VIEW COMPARISON:  CT 12/15/2020 FINDINGS: 10 mm calcification projects over the mid pole of the left kidney. This appears located more lateral than on prior CT and may have migrated back into the left kidney. Nonobstructive bowel gas  pattern. No organomegaly or free air. IMPRESSION: 10 mm calcification in the left abdomen appears more laterally located than on prior CT and may have migrated back into the left kidney. Electronically Signed   By: Rolm Baptise M.D.   On: 12/21/2020 12:42    Assessment/Plan 1. Acute cystitis without hematuria -complete course of cefepime as planned -continue IVFs  -monitor temp   2. Left renal stone -passing gradually -having pain that is not controlled with tylenol at times so did add tramadol when severe bid prn -has been able to void in bedside commode with minimal retention since yesterday  3. Weakness -due to acute infection and nephrolithiasis -encourage intake and hydration orally  4. Age-related osteoporosis without current pathological fracture -not on vitamins with her hyperparathyroidism  5. Coronary artery disease involving native coronary artery of native heart without angina pectoris -losartan is on hold due to hypotension and acute renal failure with stones so wait on bmp that had to be drawn later before resume  6. Dehydration -continue IVFs pending bmp and due to her poor intake of fluids orally  7. Urinary retention -improved, passing urine and stones -continue to encourage hydration and monitor I/os  Family/ staff Communication: d/w snf nurse  Labs/tests ordered:  Pending bmp  Maryum Batterson L. Jaishaun Mcnab, D.O. [facility] [address], [place] [address] Cell Phone (Mon-Fri 8am-5pm):  [phone] On Call:  [phone] & follow prompts after 5pm & weekends Office Phone:  [phone] Office Fax:  [phone]

## 2020-12-28 ENCOUNTER — Non-Acute Institutional Stay (SKILLED_NURSING_FACILITY): Payer: Medicare Other | Admitting: Adult Health

## 2020-12-28 ENCOUNTER — Encounter: Payer: Self-pay | Admitting: Adult Health

## 2020-12-28 DIAGNOSIS — R531 Weakness: Secondary | ICD-10-CM

## 2020-12-28 DIAGNOSIS — R059 Cough, unspecified: Secondary | ICD-10-CM

## 2020-12-28 DIAGNOSIS — N3000 Acute cystitis without hematuria: Secondary | ICD-10-CM | POA: Diagnosis not present

## 2020-12-28 DIAGNOSIS — N200 Calculus of kidney: Secondary | ICD-10-CM | POA: Diagnosis not present

## 2020-12-28 DIAGNOSIS — R339 Retention of urine, unspecified: Secondary | ICD-10-CM | POA: Diagnosis not present

## 2020-12-28 NOTE — Progress Notes (Signed)
Location:  [facility]up[facility] psychologist of Service:  SNF (31) Provider:   Cindi Carbon, [place] [phone]   Schoenhoff, Altamese Cabal, MD  Patient Care Team: Lanice Shirts, MD as PCP - General (Internal Medicine) Buford Dresser, MD as PCP - Cardiology (Cardiology) Martinique, Amy, MD (Dermatology) [place], [place] Eduardo Osier., MD (Urology) Leighton Ruff, MD as Consulting Physician (General Surgery) Mauri Pole, MD as Consulting Physician (Gastroenterology)  No emergency contact information on file.  Code Status:  Full code  Goals of care: Advanced Directive information Advanced Directives 12/26/2020  Does Patient Have a Medical Advance Directive? Yes  Type of Advance Directive Living will  Does patient want to make changes to medical advance directive? No - Patient declined  Copy of [place] in Chart? -  Would patient like information on creating a medical advance directive? -     Chief Complaint  Patient presents with  . Acute Visit    Low grade temp     HPI:  Pt is a 80 y.o. female seen today for an acute visit for low grade temps and lack of progress.   She was admitted to rehab on 12/21/20 s/p ESWL of left UPJ calculus.  Later she developed fever and weakness and was found to have a UTI, UA C and S 2/6 100,000 colonies of EColi and placed on Cefepime and IVF.  Urology evaluated the resident and the NP called me and let me know that her CT scan did not show any obstruction on the left. Her left kidney was bruised but this was to be expected. Rapid covid neg 2/7.  This morning she is still feeling weak and has some mild pain in the LUQ and LLQ. Has some constipation but this resolved with MOM and prune juice. Temps running 99.1-99.5.  She is able to walk to the [place] and is no longer "shaking" by her account but still feels weak and not able to return home to [place]. She has a dry cough that is new also. No sore  throat, body aches, sob, sputum production, etc.   She is urinating well and has not needed I and O cath, which was needed on 2/7.    Her BP was elevated in the 562'Z systolic this morning but this was on the automatic cuff. Manual BP done after meds was 120/68.   Past Medical History:  Diagnosis Date  . Abdominal pain, unspecified site   . ADVERSE REACTION TO MEDICATION 07/31/2009  . ANEMIA-NOS 07/07/2007  . Arthritis   . Chest pain, unspecified   . Chronic kidney disease    hx of kidney stone  . COLONIC POLYPS, ADENOMATOUS, HX OF   . Complication of anesthesia   . Disturbance of skin sensation   . DIVERTICULOSIS, COLON 02/23/2009  . Family history of diabetes mellitus   . Family history of malignant neoplasm of breast   . FATIGUE 05/25/2009  . Fever, unspecified   . Heart murmur    hx of  . HYPERLIPIDEMIA 07/07/2007  . HYPERPARATHYROIDISM UNSPECIFIED 10/20/2007  . HYPERTENSION 02/04/2008  . Irritable bowel syndrome (IBS)   . NEPHROLITHIASIS, HX OF 11/15/2008  . OSTEOPOROSIS 01/01/2008  . Other acquired absence of organ    parathyroidectomy  . Pain in limb   . PALPITATIONS, OCCASIONAL 07/12/2008  . PARATHYROIDECTOMY 01/02/2009  . PONV (postoperative nausea and vomiting)   . SCOLIOSIS 05/25/2009  . Seasonal allergies   . Unspecified constipation   .  URINARY INCONTINENCE 07/07/2007  . UTI'S, RECURRENT 11/20/2009   kimbrough    Past Surgical History:  Procedure Laterality Date  . BUNIONECTOMY    . COLONOSCOPY    . EXTRACORPOREAL SHOCK WAVE LITHOTRIPSY Left 12/21/2020   Procedure: EXTRACORPOREAL SHOCK WAVE LITHOTRIPSY (ESWL);  Surgeon: Festus Aloe, MD;  Location: [facility];  Service: Urology;  Laterality: Left;  . LAPAROSCOPIC APPENDECTOMY  01/19/2016   Procedure: APPENDECTOMY LAPAROSCOPIC with orifice of cecal polyp;  Surgeon: Leighton Ruff, MD;  Location: [facility];  Service: General;;  . LEFT HEART CATH AND CORONARY ANGIOGRAPHY N/A 04/07/2017   Procedure: Left Heart  Cath and Coronary Angiography;  Surgeon: Nelva Bush, MD;  Location: [facility];  Service: Cardiovascular;  Laterality: N/A;  . PARATHYROIDECTOMY     Rt Superior open neck exploration  . POLYPECTOMY    . RIGHT OOPHORECTOMY    . TONSILLECTOMY      Allergies  Allergen Reactions  . Anesthetics, Amide Other (See Comments)    Pt states she had hallucinations and HTN after procedure 01/19/16-she feels may have been reaction to anesthetic  . Lisinopril Cough    Outpatient Encounter Medications as of 12/28/2020  Medication Sig  . losartan (COZAAR) 50 MG tablet Take 25 mg by mouth 2 (two) times daily.  [name] acetaminophen (TYLENOL) 325 MG tablet Take 650 mg by mouth every 4 (four) hours as needed.  . bisacodyl (DULCOLAX) 10 MG suppository Place 10 mg rectally as needed for moderate constipation.  [name] ceFEPime (MAXIPIME) IVPB Inject 1 g into the vein every 12 (twelve) hours.  . diazepam (VALIUM) 2 MG tablet 2 mg at bedtime as needed.  . ondansetron (ZOFRAN) 4 MG tablet Take 4 mg by mouth every 6 (six) hours as needed for nausea or vomiting.  . [DISCONTINUED] ceFEPIme 1 g in sodium chloride 0.9 % 100 mL Inject 1 g into the vein every 12 (twelve) hours.  . [DISCONTINUED] Sodium Chloride Flush (NORMAL SALINE FLUSH) 0.9 % SOLN Inject 80 mLs into the vein. 80/hr x 2 liters   No facility-administered encounter medications on file as of 12/28/2020.    Review of Systems  Constitutional: Positive for activity change, fatigue and fever. Negative for appetite change, chills, diaphoresis and unexpected weight change.  HENT: Negative for congestion.   Respiratory: Positive for cough. Negative for shortness of breath and wheezing.   Cardiovascular: Negative for chest pain, palpitations and leg swelling.  Gastrointestinal: Negative for abdominal distention, abdominal pain (mild in the LUQ ad LLQ), constipation, diarrhea, nausea and vomiting.  Genitourinary: Negative for difficulty urinating, dysuria,  frequency and pelvic pain.  Musculoskeletal: Negative for arthralgias, back pain, gait problem, joint swelling and myalgias.  Neurological: Positive for weakness. Negative for dizziness, tremors, seizures, syncope, facial asymmetry, speech difficulty, light-headedness, numbness and headaches.  Psychiatric/Behavioral: Negative for agitation, behavioral problems and confusion.    Immunization History  Administered Date(s) Administered  . Influenza Split 09/17/2011, 11/08/2015, 09/12/2017, 08/02/2020  . Influenza Whole 08/19/2008  . Influenza, High Dose Seasonal PF 08/20/2016, 09/12/2017, 08/21/2018, 09/28/2019  . Influenza-Unspecified 09/18/2013, 08/18/2014, 11/08/2015, 09/15/2020  . Moderna Sars-Covid-2 Vaccination 11/30/2019, 12/31/2019, 10/26/2020  . Pneumococcal Conjugate-13 12/29/2013  . Pneumococcal Polysaccharide-23 11/08/2015  . Tdap 01/05/2015   Pertinent  Health Maintenance Due  Topic Date Due  . COLONOSCOPY (Pts 45-10yrs Insurance coverage will need to be confirmed)  06/04/2019  . INFLUENZA VACCINE  Completed  . DEXA SCAN  Completed  . PNA vac Low Risk Adult  Completed   Fall  Risk  01/05/2015 12/29/2013  Falls in the past year? No No   Functional Status Survey:    Vitals:   12/28/20 1005  BP: 120/68  Pulse: (!) 58  Resp: 20  Temp: (!) 97.3 F (36.3 C)  SpO2: 94%   There is no height or weight on file to calculate BMI. Physical Exam Vitals and nursing note reviewed.  Constitutional:      General: She is not in acute distress.    Appearance: She is not diaphoretic.  HENT:     Head: Normocephalic and atraumatic.     Nose: Nose normal. No congestion.     Mouth/Throat:     Mouth: Mucous membranes are moist.     Pharynx: Oropharynx is clear.  Neck:     Vascular: No JVD.  Cardiovascular:     Rate and Rhythm: Normal rate and regular rhythm.     Heart sounds: No murmur heard.   Pulmonary:     Effort: Pulmonary effort is normal. No respiratory distress.      Breath sounds: No wheezing.     Comments: Decreased bases  Abdominal:     General: Bowel sounds are normal. There is no distension.     Palpations: Abdomen is soft.     Tenderness: There is abdominal tenderness (s/p area and LUQ). There is no right CVA tenderness or left CVA tenderness.  Musculoskeletal:     Right lower leg: No edema.     Left lower leg: No edema.  Skin:    General: Skin is warm and dry.     Coloration: Skin is pale.  Neurological:     Mental Status: She is alert and oriented to person, place, and time.  Psychiatric:        Mood and Affect: Mood normal.     Labs reviewed: No results for input(s): NA, K, CL, CO2, GLUCOSE, BUN, CREATININE, CALCIUM, MG, PHOS in the last 8760 hours. No results for input(s): AST, ALT, ALKPHOS, BILITOT, PROT, ALBUMIN in the last 8760 hours. No results for input(s): WBC, NEUTROABS, HGB, HCT, MCV, PLT in the last 8760 hours. Lab Results  Component Value Date   TSH 2.808 02/09/2017   No results found for: HGBA1C Lab Results  Component Value Date   CHOL 132 01/25/2015   HDL 68 01/25/2015   LDLCALC 51 01/25/2015   LDLDIRECT 156.3 03/28/2011   TRIG 64 01/25/2015   CHOLHDL 1.9 01/25/2015    Significant Diagnostic Results in last 30 days:  DG Abd 1 View  Result Date: 12/21/2020 CLINICAL DATA:  Preop lithotripsy EXAM: ABDOMEN - 1 VIEW COMPARISON:  CT 12/15/2020 FINDINGS: 10 mm calcification projects over the mid pole of the left kidney. This appears located more lateral than on prior CT and may have migrated back into the left kidney. Nonobstructive bowel gas pattern. No organomegaly or free air. IMPRESSION: 10 mm calcification in the left abdomen appears more laterally located than on prior CT and may have migrated back into the left kidney. Electronically Signed   By: Rolm Baptise M.D.   On: 12/21/2020 12:42   CT HEAD WO CONTRAST  Result Date: 12/15/2020 CLINICAL DATA:  Abnormality of gait. Additional history provided by scanning  technologist: Patient reports elevated blood pressure, right-sided weakness, symptoms for 1 week. EXAM: CT HEAD WITHOUT CONTRAST TECHNIQUE: Contiguous axial images were obtained from the base of the skull through the vertex without intravenous contrast. COMPARISON:  Head CT 12/18/2017. FINDINGS: Brain: Mild cerebral and cerebellar atrophy. Mild ill-defined  hypoattenuation within the cerebral white matter is nonspecific, but compatible with chronic small vessel ischemic disease. There is no acute intracranial hemorrhage. No demarcated cortical infarct. No extra-axial fluid collection. No evidence of intracranial mass. No midline shift. Vascular: No hyperdense vessel.  Atherosclerotic calcifications. Skull: Normal. Negative for fracture or focal lesion. Sinuses/Orbits: Visualized orbits show no acute finding. Mild mucosal thickening and small volume frothy secretions within the left sphenoid sinus. IMPRESSION: No evidence of acute intracranial abnormality. Mild generalized atrophy of the brain and cerebral white matter chronic small vessel ischemic disease, stable as compared to the head CT of 12/18/2017. Left sphenoid sinusitis. Electronically Signed   By: Kellie Simmering DO   On: 12/15/2020 16:55   CT ABDOMEN PELVIS W CONTRAST  Result Date: 12/15/2020 CLINICAL DATA:  Lower abdominal tenderness EXAM: CT ABDOMEN AND PELVIS WITH CONTRAST TECHNIQUE: Multidetector CT imaging of the abdomen and pelvis was performed using the standard protocol following bolus administration of intravenous contrast. CONTRAST:  148mL ISOVUE-300 IOPAMIDOL (ISOVUE-300) INJECTION 61% COMPARISON:  CT 09/08/2019 FINDINGS: Lower chest: Lung bases demonstrate mild bronchiectasis in the right lower lobe with atelectasis. No pleural effusion. Cardiomegaly. Hepatobiliary: Subcentimeter hypodensity in the liver too small to further characterize. No calcified gallstone or biliary dilatation Pancreas: Unremarkable. No pancreatic ductal dilatation or  surrounding inflammatory changes. Spleen: Normal in size without focal abnormality. Adrenals/Urinary Tract: Adrenal glands are normal. Small exophytic cyst off the mid left kidney. Moderate to marked left hydronephrosis, secondary to a 10 by 11 mm left UPJ stone. Mild urothelial enhancement left renal pelvis and proximal ureter. No distal ureteral stone. Bladder is unremarkable Stomach/Bowel: The stomach is nonenlarged. There is no dilated small bowel. No acute colon wall thickening. Probable prior appendectomy. Vascular/Lymphatic: Moderate aortic atherosclerosis without aneurysm. No suspicious nodes. Reproductive: Uterus and bilateral adnexa are unremarkable. Other: Negative for free air or free fluid. Musculoskeletal: Scoliosis and degenerative changes of the spine. Severe chronic compression fracture T12. IMPRESSION: 1. Moderate to marked left hydronephrosis, secondary to a 10 x 11 mm left UPJ stone. Mild urothelial enhancement of the left renal pelvis and proximal ureter, could be secondary to infection or inflammation. 2. Cardiomegaly. These results will be called to the ordering clinician or representative by the Radiologist Assistant, and communication documented in the PACS or [employer]. Aortic Atherosclerosis (ICD10-I70.0). Electronically Signed   By: Donavan Foil M.D.   On: 12/15/2020 22:55    Assessment/Plan  1. Acute cystitis without hematuria Day 4/7 of Cefepime. Fever trending down but continues with low grade temps. Will contact urology and order follow up labs and CXR  2. Left renal stone S/p ESWL  Followed by urology   3. Cough Possibly due to atx CXR and labs  IS 10 breaths QID   4. Weakness PT eval and treat  5. Urinary retention Improved.  Continue bladder scans qshift and I/O cath if PVR >/=400     Family/ staff Communication: discussed with the resident and her son Hal   Labs/tests ordered:  CBC BMP CXR

## 2020-12-29 DIAGNOSIS — N390 Urinary tract infection, site not specified: Secondary | ICD-10-CM | POA: Diagnosis not present

## 2020-12-29 DIAGNOSIS — M6281 Muscle weakness (generalized): Secondary | ICD-10-CM | POA: Diagnosis not present

## 2020-12-29 LAB — BASIC METABOLIC PANEL
BUN: 8 (ref 4–21)
CO2: 27 — AB (ref 13–22)
Chloride: 102 (ref 99–108)
Creatinine: 0.6 (ref 0.5–1.1)
Glucose: 104
Potassium: 3.6 (ref 3.4–5.3)
Sodium: 142 (ref 137–147)

## 2020-12-29 LAB — CBC: RBC: 3.88 (ref 3.87–5.11)

## 2020-12-29 LAB — CBC AND DIFFERENTIAL
HCT: 36 (ref 36–46)
Hemoglobin: 12.5 (ref 12.0–16.0)
WBC: 5.5

## 2020-12-29 LAB — COMPREHENSIVE METABOLIC PANEL: Calcium: 8.7 (ref 8.7–10.7)

## 2021-01-01 DIAGNOSIS — M62561 Muscle wasting and atrophy, not elsewhere classified, right lower leg: Secondary | ICD-10-CM | POA: Diagnosis not present

## 2021-01-01 DIAGNOSIS — M810 Age-related osteoporosis without current pathological fracture: Secondary | ICD-10-CM | POA: Diagnosis not present

## 2021-01-01 DIAGNOSIS — M62562 Muscle wasting and atrophy, not elsewhere classified, left lower leg: Secondary | ICD-10-CM | POA: Diagnosis not present

## 2021-01-01 DIAGNOSIS — N200 Calculus of kidney: Secondary | ICD-10-CM | POA: Diagnosis not present

## 2021-01-01 DIAGNOSIS — Z5189 Encounter for other specified aftercare: Secondary | ICD-10-CM | POA: Diagnosis not present

## 2021-01-01 DIAGNOSIS — R278 Other lack of coordination: Secondary | ICD-10-CM | POA: Diagnosis not present

## 2021-01-01 DIAGNOSIS — R2689 Other abnormalities of gait and mobility: Secondary | ICD-10-CM | POA: Diagnosis not present

## 2021-01-01 DIAGNOSIS — J17 Pneumonia in diseases classified elsewhere: Secondary | ICD-10-CM | POA: Diagnosis not present

## 2021-01-02 DIAGNOSIS — N200 Calculus of kidney: Secondary | ICD-10-CM | POA: Diagnosis not present

## 2021-01-02 DIAGNOSIS — M810 Age-related osteoporosis without current pathological fracture: Secondary | ICD-10-CM | POA: Diagnosis not present

## 2021-01-02 DIAGNOSIS — R278 Other lack of coordination: Secondary | ICD-10-CM | POA: Diagnosis not present

## 2021-01-02 DIAGNOSIS — M62561 Muscle wasting and atrophy, not elsewhere classified, right lower leg: Secondary | ICD-10-CM | POA: Diagnosis not present

## 2021-01-02 DIAGNOSIS — Z5189 Encounter for other specified aftercare: Secondary | ICD-10-CM | POA: Diagnosis not present

## 2021-01-02 DIAGNOSIS — R2689 Other abnormalities of gait and mobility: Secondary | ICD-10-CM | POA: Diagnosis not present

## 2021-01-02 DIAGNOSIS — M62562 Muscle wasting and atrophy, not elsewhere classified, left lower leg: Secondary | ICD-10-CM | POA: Diagnosis not present

## 2021-01-02 DIAGNOSIS — J17 Pneumonia in diseases classified elsewhere: Secondary | ICD-10-CM | POA: Diagnosis not present

## 2021-01-03 DIAGNOSIS — R2689 Other abnormalities of gait and mobility: Secondary | ICD-10-CM | POA: Diagnosis not present

## 2021-01-03 DIAGNOSIS — R278 Other lack of coordination: Secondary | ICD-10-CM | POA: Diagnosis not present

## 2021-01-03 DIAGNOSIS — M810 Age-related osteoporosis without current pathological fracture: Secondary | ICD-10-CM | POA: Diagnosis not present

## 2021-01-03 DIAGNOSIS — N200 Calculus of kidney: Secondary | ICD-10-CM | POA: Diagnosis not present

## 2021-01-03 DIAGNOSIS — J17 Pneumonia in diseases classified elsewhere: Secondary | ICD-10-CM | POA: Diagnosis not present

## 2021-01-03 DIAGNOSIS — M62561 Muscle wasting and atrophy, not elsewhere classified, right lower leg: Secondary | ICD-10-CM | POA: Diagnosis not present

## 2021-01-03 DIAGNOSIS — Z5189 Encounter for other specified aftercare: Secondary | ICD-10-CM | POA: Diagnosis not present

## 2021-01-03 DIAGNOSIS — M62562 Muscle wasting and atrophy, not elsewhere classified, left lower leg: Secondary | ICD-10-CM | POA: Diagnosis not present

## 2021-01-04 DIAGNOSIS — N132 Hydronephrosis with renal and ureteral calculous obstruction: Secondary | ICD-10-CM | POA: Diagnosis not present

## 2021-01-04 DIAGNOSIS — N3000 Acute cystitis without hematuria: Secondary | ICD-10-CM | POA: Diagnosis not present

## 2021-01-05 DIAGNOSIS — N200 Calculus of kidney: Secondary | ICD-10-CM | POA: Diagnosis not present

## 2021-01-05 DIAGNOSIS — Z5189 Encounter for other specified aftercare: Secondary | ICD-10-CM | POA: Diagnosis not present

## 2021-01-05 DIAGNOSIS — R278 Other lack of coordination: Secondary | ICD-10-CM | POA: Diagnosis not present

## 2021-01-05 DIAGNOSIS — R2689 Other abnormalities of gait and mobility: Secondary | ICD-10-CM | POA: Diagnosis not present

## 2021-01-05 DIAGNOSIS — M62562 Muscle wasting and atrophy, not elsewhere classified, left lower leg: Secondary | ICD-10-CM | POA: Diagnosis not present

## 2021-01-05 DIAGNOSIS — J17 Pneumonia in diseases classified elsewhere: Secondary | ICD-10-CM | POA: Diagnosis not present

## 2021-01-05 DIAGNOSIS — M62561 Muscle wasting and atrophy, not elsewhere classified, right lower leg: Secondary | ICD-10-CM | POA: Diagnosis not present

## 2021-01-05 DIAGNOSIS — M810 Age-related osteoporosis without current pathological fracture: Secondary | ICD-10-CM | POA: Diagnosis not present

## 2021-01-06 DIAGNOSIS — M62562 Muscle wasting and atrophy, not elsewhere classified, left lower leg: Secondary | ICD-10-CM | POA: Diagnosis not present

## 2021-01-06 DIAGNOSIS — M62561 Muscle wasting and atrophy, not elsewhere classified, right lower leg: Secondary | ICD-10-CM | POA: Diagnosis not present

## 2021-01-06 DIAGNOSIS — R2689 Other abnormalities of gait and mobility: Secondary | ICD-10-CM | POA: Diagnosis not present

## 2021-01-06 DIAGNOSIS — M810 Age-related osteoporosis without current pathological fracture: Secondary | ICD-10-CM | POA: Diagnosis not present

## 2021-01-06 DIAGNOSIS — N200 Calculus of kidney: Secondary | ICD-10-CM | POA: Diagnosis not present

## 2021-01-06 DIAGNOSIS — J17 Pneumonia in diseases classified elsewhere: Secondary | ICD-10-CM | POA: Diagnosis not present

## 2021-01-06 DIAGNOSIS — Z5189 Encounter for other specified aftercare: Secondary | ICD-10-CM | POA: Diagnosis not present

## 2021-01-06 DIAGNOSIS — R278 Other lack of coordination: Secondary | ICD-10-CM | POA: Diagnosis not present

## 2021-01-07 DIAGNOSIS — Z5189 Encounter for other specified aftercare: Secondary | ICD-10-CM | POA: Diagnosis not present

## 2021-01-07 DIAGNOSIS — M62562 Muscle wasting and atrophy, not elsewhere classified, left lower leg: Secondary | ICD-10-CM | POA: Diagnosis not present

## 2021-01-07 DIAGNOSIS — R278 Other lack of coordination: Secondary | ICD-10-CM | POA: Diagnosis not present

## 2021-01-07 DIAGNOSIS — N200 Calculus of kidney: Secondary | ICD-10-CM | POA: Diagnosis not present

## 2021-01-07 DIAGNOSIS — M62561 Muscle wasting and atrophy, not elsewhere classified, right lower leg: Secondary | ICD-10-CM | POA: Diagnosis not present

## 2021-01-07 DIAGNOSIS — R2689 Other abnormalities of gait and mobility: Secondary | ICD-10-CM | POA: Diagnosis not present

## 2021-01-07 DIAGNOSIS — M810 Age-related osteoporosis without current pathological fracture: Secondary | ICD-10-CM | POA: Diagnosis not present

## 2021-01-07 DIAGNOSIS — J17 Pneumonia in diseases classified elsewhere: Secondary | ICD-10-CM | POA: Diagnosis not present

## 2021-01-08 ENCOUNTER — Telehealth: Payer: Self-pay | Admitting: Family

## 2021-01-08 DIAGNOSIS — M810 Age-related osteoporosis without current pathological fracture: Secondary | ICD-10-CM | POA: Diagnosis not present

## 2021-01-08 DIAGNOSIS — N200 Calculus of kidney: Secondary | ICD-10-CM | POA: Diagnosis not present

## 2021-01-08 DIAGNOSIS — R278 Other lack of coordination: Secondary | ICD-10-CM | POA: Diagnosis not present

## 2021-01-08 DIAGNOSIS — J17 Pneumonia in diseases classified elsewhere: Secondary | ICD-10-CM | POA: Diagnosis not present

## 2021-01-08 DIAGNOSIS — M62562 Muscle wasting and atrophy, not elsewhere classified, left lower leg: Secondary | ICD-10-CM | POA: Diagnosis not present

## 2021-01-08 DIAGNOSIS — M62561 Muscle wasting and atrophy, not elsewhere classified, right lower leg: Secondary | ICD-10-CM | POA: Diagnosis not present

## 2021-01-08 DIAGNOSIS — Z5189 Encounter for other specified aftercare: Secondary | ICD-10-CM | POA: Diagnosis not present

## 2021-01-08 DIAGNOSIS — R2689 Other abnormalities of gait and mobility: Secondary | ICD-10-CM | POA: Diagnosis not present

## 2021-01-08 NOTE — Telephone Encounter (Signed)
On call late entry 2 /20/2022:  [place] Nurse called at 9 pm stated patient's Blood pressure was 186/92 current on Losartan 50 mg tablet twice daily.evening dose already given at 5 pm.Asymptomatic.Advised to give clonidine 0.1 mg tablet x 1 dose.Nurse reported clonidine 0.1 mg not available in the facility [facility] advised to administered clonidine 0.4 mg tablet x 1 dose available in [facility] then recheck B/p. Please follow up.

## 2021-01-09 ENCOUNTER — Ambulatory Visit: Payer: Medicare Other | Admitting: Gastroenterology

## 2021-01-09 ENCOUNTER — Encounter: Payer: Self-pay | Admitting: Internal Medicine

## 2021-01-09 ENCOUNTER — Non-Acute Institutional Stay (SKILLED_NURSING_FACILITY): Payer: Medicare Other | Admitting: Internal Medicine

## 2021-01-09 DIAGNOSIS — G301 Alzheimer's disease with late onset: Secondary | ICD-10-CM

## 2021-01-09 DIAGNOSIS — R0989 Other specified symptoms and signs involving the circulatory and respiratory systems: Secondary | ICD-10-CM

## 2021-01-09 DIAGNOSIS — M62561 Muscle wasting and atrophy, not elsewhere classified, right lower leg: Secondary | ICD-10-CM | POA: Diagnosis not present

## 2021-01-09 DIAGNOSIS — M810 Age-related osteoporosis without current pathological fracture: Secondary | ICD-10-CM | POA: Diagnosis not present

## 2021-01-09 DIAGNOSIS — R278 Other lack of coordination: Secondary | ICD-10-CM | POA: Diagnosis not present

## 2021-01-09 DIAGNOSIS — R2681 Unsteadiness on feet: Secondary | ICD-10-CM | POA: Diagnosis not present

## 2021-01-09 DIAGNOSIS — F0280 Dementia in other diseases classified elsewhere without behavioral disturbance: Secondary | ICD-10-CM | POA: Diagnosis not present

## 2021-01-09 DIAGNOSIS — H532 Diplopia: Secondary | ICD-10-CM | POA: Diagnosis not present

## 2021-01-09 DIAGNOSIS — Z5189 Encounter for other specified aftercare: Secondary | ICD-10-CM | POA: Diagnosis not present

## 2021-01-09 DIAGNOSIS — N200 Calculus of kidney: Secondary | ICD-10-CM | POA: Diagnosis not present

## 2021-01-09 DIAGNOSIS — E213 Hyperparathyroidism, unspecified: Secondary | ICD-10-CM | POA: Diagnosis not present

## 2021-01-09 DIAGNOSIS — R2689 Other abnormalities of gait and mobility: Secondary | ICD-10-CM | POA: Diagnosis not present

## 2021-01-09 DIAGNOSIS — M62562 Muscle wasting and atrophy, not elsewhere classified, left lower leg: Secondary | ICD-10-CM | POA: Diagnosis not present

## 2021-01-09 DIAGNOSIS — K5904 Chronic idiopathic constipation: Secondary | ICD-10-CM

## 2021-01-09 DIAGNOSIS — J17 Pneumonia in diseases classified elsewhere: Secondary | ICD-10-CM | POA: Diagnosis not present

## 2021-01-09 NOTE — Progress Notes (Signed)
Location:  [place] Room Number: 153-A Place of Service:  SNF (31) Provider:  Mahmud Keithly L. Mariea Clonts, D.O., C.M.D.  Coralyn Mark, Altamese Cabal, MD  Patient Care Team: Lanice Shirts, MD as PCP - General (Internal Medicine) Buford Dresser, MD as PCP - Cardiology (Cardiology) Martinique, Amy, MD (Dermatology) [place], [place] Eduardo Osier., MD (Urology) Leighton Ruff, MD as Consulting Physician (General Surgery) Mauri Pole, MD as Consulting Physician (Gastroenterology)  No emergency contact information on file.  Code Status:  FULL Goals of care: Advanced Directive information Advanced Directives 01/16/2021  Does Patient Have a Medical Advance Directive? Yes  Type of Advance Directive Living will  Does patient want to make changes to medical advance directive? No - Patient declined  Copy of [place] in Chart? -  Would patient like information on creating a medical advance directive? -   Chief Complaint  Patient presents with  . Acute Visit    Blood pressure and constipation     HPI:  Pt is a 80 y.o. female seen today for an acute visit for constipation and variable blood pressures.  [name] has finished her course of abx and had a urology f/u re: her kidney stones.  No longer needing to have urine strained to collect.  Pain improved--not using tramadol.  Had been quite constipated--no bm from 2/13 until disimpaction 2/21.  Had gotten prune juice, MOM 2/20, and whole prunes and suppository 2/21 prior to disimpaction of very hard stool.  She used to take miralax and senna at some point but does not recall how much or often.  Still feeling full today.  She has been up walking around.    Has far as the blood pressure goes, she's typically been running 140s-170s/60s to 70s even after losartan increased to 50mg  po bid.  She had a spell of severe hypertension over the weekend to 186/92 and was given clonidine 0.4mg  which was what  was in the pixis (not 0.1mg ).  She then had difficulty with bp dropping the following day (2/21) to 85/55, 100/80.  She was then dizzy, pale and shaky.  BP since is back up to 140s-150s/70s.   She does have periods of severe anxiety and has prn valium (lonstanding med for her) for this.  She also c/o leg spasms when ambulating to the bathroom on two occasions.  Her son asked to meet with me today, but he had not yet arrived when I saw her.    Past Medical History:  Diagnosis Date  . Abdominal pain, unspecified site   . ADVERSE REACTION TO MEDICATION 07/31/2009  . ANEMIA-NOS 07/07/2007  . Arthritis   . Chest pain, unspecified   . Chronic kidney disease    hx of kidney stone  . COLONIC POLYPS, ADENOMATOUS, HX OF   . Complication of anesthesia   . Disturbance of skin sensation   . DIVERTICULOSIS, COLON 02/23/2009  . Family history of diabetes mellitus   . Family history of malignant neoplasm of breast   . FATIGUE 05/25/2009  . Fever, unspecified   . Heart murmur    hx of  . HYPERLIPIDEMIA 07/07/2007  . HYPERPARATHYROIDISM UNSPECIFIED 10/20/2007  . HYPERTENSION 02/04/2008  . Irritable bowel syndrome (IBS)   . NEPHROLITHIASIS, HX OF 11/15/2008  . OSTEOPOROSIS 01/01/2008  . Other acquired absence of organ    parathyroidectomy  . Pain in limb   . PALPITATIONS, OCCASIONAL 07/12/2008  . PARATHYROIDECTOMY 01/02/2009  . PONV (postoperative nausea and vomiting)   .  SCOLIOSIS 05/25/2009  . Seasonal allergies   . Unspecified constipation   . URINARY INCONTINENCE 07/07/2007  . UTI'S, RECURRENT 11/20/2009   kimbrough    Past Surgical History:  Procedure Laterality Date  . BUNIONECTOMY    . COLONOSCOPY    . EXTRACORPOREAL SHOCK WAVE LITHOTRIPSY Left 12/21/2020   Procedure: EXTRACORPOREAL SHOCK WAVE LITHOTRIPSY (ESWL);  Surgeon: Festus Aloe, MD;  [facility];  Service: Urology;  Laterality: Left;  . LAPAROSCOPIC APPENDECTOMY  01/19/2016   Procedure: APPENDECTOMY  LAPAROSCOPIC with orifice of cecal polyp;  Surgeon: Leighton Ruff, MD;  Location: [facility];  Service: General;;  . LEFT HEART CATH AND CORONARY ANGIOGRAPHY N/A 04/07/2017   Procedure: Left Heart Cath and Coronary Angiography;  Surgeon: Nelva Bush, MD;  Location: [facility];  Service: Cardiovascular;  Laterality: N/A;  . PARATHYROIDECTOMY     Rt Superior open neck exploration  . POLYPECTOMY    . RIGHT OOPHORECTOMY    . TONSILLECTOMY      Allergies  Allergen Reactions  . Anesthetics, Amide Other (See Comments)    Pt states she had hallucinations and HTN after procedure 01/19/16-she feels may have been reaction to anesthetic  . Lisinopril Cough    Outpatient Encounter Medications as of 01/09/2021  Medication Sig  . buPROPion (WELLBUTRIN XL) 150 MG 24 hr tablet Take 150 mg by mouth at bedtime.  [name] losartan (COZAAR) 50 MG tablet Take 25 mg by mouth 2 (two) times daily. Hold for SBP < 100  . ondansetron (ZOFRAN) 4 MG tablet Take 4 mg by mouth every 6 (six) hours as needed for nausea or vomiting.  . traMADol (ULTRAM) 50 MG tablet Take 50 mg by mouth 2 (two) times daily as needed.  . [DISCONTINUED] bisacodyl (DULCOLAX) 10 MG suppository Place 10 mg rectally as needed for moderate constipation.  . [DISCONTINUED] diazepam (VALIUM) 2 MG tablet Take 2 mg by mouth at bedtime as needed (x 14 days).  . [DISCONTINUED] acetaminophen (TYLENOL) 325 MG tablet Take 650 mg by mouth every 4 (four) hours as needed.  . [DISCONTINUED] ceFEPime (MAXIPIME) IVPB Inject 1 g into the vein every 12 (twelve) hours.   No facility-administered encounter medications on file as of 01/09/2021.    Review of Systems  Constitutional: Negative for chills, fever and malaise/fatigue.  HENT: Negative for congestion and sore throat.   Eyes: Negative for blurred vision.  Respiratory: Negative for cough and shortness of breath.   Cardiovascular: Negative for chest pain and palpitations.  Gastrointestinal: Positive for  constipation. Negative for abdominal pain, blood in stool, diarrhea and melena.  Genitourinary: Negative for dysuria and flank pain.  Musculoskeletal: Negative for falls.       Unsteadiness, some dizziness when looks left related to her diplopia  Skin: Negative for itching and rash.  Neurological: Positive for dizziness and tremors. Negative for loss of consciousness.  Endo/Heme/Allergies: Bruises/bleeds easily.  Psychiatric/Behavioral: Positive for memory loss. The patient is nervous/anxious. The patient does not have insomnia.        Grief re: loss of husband a few months ago    Immunization History  Administered Date(s) Administered  . Influenza Split 09/17/2011, 11/08/2015, 09/12/2017, 08/02/2020  . Influenza Whole 08/19/2008  . Influenza, High Dose Seasonal PF 08/20/2016, 09/12/2017, 08/21/2018, 09/28/2019  . Influenza-Unspecified 09/18/2013, 08/18/2014, 11/08/2015, 09/15/2020  . Moderna Sars-Covid-2 Vaccination 11/30/2019, 12/31/2019, 10/26/2020  . Pneumococcal Conjugate-13 12/29/2013  . Pneumococcal Polysaccharide-23 11/08/2015  . Tdap 01/05/2015   Pertinent  Health Maintenance Due  Topic  Date Due  . COLONOSCOPY (Pts 45-29yrs Insurance coverage will need to be confirmed)  06/04/2019  . INFLUENZA VACCINE  Completed  . DEXA SCAN  Completed  . PNA vac Low Risk Adult  Completed   Fall Risk  01/05/2015 12/29/2013  Falls in the past year? No No   Functional Status Survey:    Vitals:   01/09/21 1550  BP: (!) 142/76  Pulse: 68  Resp: 16  Temp: 98.1 F (36.7 C)  SpO2: 96%  Weight: 106 lb (48.1 kg)  Height: 5' (1.524 m)   Body mass index is 20.7 kg/m. Physical Exam Vitals reviewed.  Constitutional:      General: She is not in acute distress.    Appearance: Normal appearance. She is not toxic-appearing.  HENT:     Head: Normocephalic and atraumatic.     Ears:     Comments: HOH    Nose: Nose normal.     Mouth/Throat:     Pharynx: Oropharynx is clear.  Eyes:      Conjunctiva/sclera: Conjunctivae normal.     Pupils: Pupils are equal, round, and reactive to light.  Cardiovascular:     Rate and Rhythm: Rhythm irregular.     Heart sounds: No friction rub. No gallop.   Pulmonary:     Effort: Pulmonary effort is normal.     Breath sounds: Normal breath sounds. No wheezing, rhonchi or rales.  Abdominal:     General: Bowel sounds are normal. There is no distension.     Palpations: Abdomen is soft.     Tenderness: There is no abdominal tenderness. There is no guarding or rebound.  Musculoskeletal:        General: Normal range of motion.     Cervical back: Neck supple.     Right lower leg: No edema.     Left lower leg: No edema.  Neurological:     General: No focal deficit present.     Mental Status: She is alert.     Gait: Gait abnormal.     Comments: Oriented to person, place and time, but short-term memory poor--repeats questions already answered earlier  Psychiatric:     Comments: A bit sad, has periods of anxiety with tremors; also notes she can extend her legs and they might shake     Labs reviewed: Recent Labs    12/29/20 0000 01/10/21 0000  NA 142 142  K 3.6 4.7  CL 102 106  CO2 27* 24*  BUN 8 14  CREATININE 0.6 0.7  CALCIUM 8.7 8.9   No results for input(s): AST, ALT, ALKPHOS, BILITOT, PROT, ALBUMIN in the last 8760 hours. Recent Labs    12/29/20 0000  WBC 5.5  HGB 12.5  HCT 36   Lab Results  Component Value Date   TSH 2.808 02/09/2017   No results found for: HGBA1C Lab Results  Component Value Date   CHOL 132 01/25/2015   HDL 68 01/25/2015   LDLCALC 51 01/25/2015   LDLDIRECT 156.3 03/28/2011   TRIG 64 01/25/2015   CHOLHDL 1.9 01/25/2015    Significant Diagnostic Results in last 30 days:  DG Abd 1 View  Result Date: 12/21/2020 CLINICAL DATA:  Preop lithotripsy EXAM: ABDOMEN - 1 VIEW COMPARISON:  CT 12/15/2020 FINDINGS: 10 mm calcification projects over the mid pole of the left kidney. This appears located more  lateral than on prior CT and may have migrated back into the left kidney. Nonobstructive bowel gas pattern. No organomegaly or free air.  IMPRESSION: 10 mm calcification in the left abdomen appears more laterally located than on prior CT and may have migrated back into the left kidney. Electronically Signed   By: Rolm Baptise M.D.   On: 12/21/2020 12:42    Assessment/Plan 1. Labile blood pressure Increase losartan to 100mg  po bid for htn  2. Chronic idiopathic constipation miralax every third day starting tonight Suppository tonight Senna s 2 tabs daily  3. Late onset Alzheimer's dementia without behavioral disturbance (HCC) Notable short-term memory loss and some judgment deficit Will need medication mgt at home if goes home rather than AL  4. Diplopia Move up eye exam at her request--chronic diplopia affects balance--nothing to actually be done about this  5. Unsteady gait when walking Cont PT and walker at all times Likely will need 24hr home care or AL long-term  6. HYPERPARATHYROIDISM UNSPECIFIED Check labs:  ICa, PTH, mag, bmp in am due to leg cramps/spasms Push po fluids     Family/ staff Communication: d/w rehab nurse  Labs/tests ordered:   ICa, PTH, mag, bmp in am  Micky Overturf L. Kamarian Sahakian, D.O. [facility] [address], [place] [address] Cell Phone (Mon-Fri 8am-5pm):  [phone] On Call:  [phone] & follow prompts after 5pm & weekends Office Phone:  [phone] Office Fax:  [phone]

## 2021-01-10 DIAGNOSIS — Z5189 Encounter for other specified aftercare: Secondary | ICD-10-CM | POA: Diagnosis not present

## 2021-01-10 DIAGNOSIS — R278 Other lack of coordination: Secondary | ICD-10-CM | POA: Diagnosis not present

## 2021-01-10 DIAGNOSIS — R2689 Other abnormalities of gait and mobility: Secondary | ICD-10-CM | POA: Diagnosis not present

## 2021-01-10 DIAGNOSIS — J17 Pneumonia in diseases classified elsewhere: Secondary | ICD-10-CM | POA: Diagnosis not present

## 2021-01-10 DIAGNOSIS — M62562 Muscle wasting and atrophy, not elsewhere classified, left lower leg: Secondary | ICD-10-CM | POA: Diagnosis not present

## 2021-01-10 DIAGNOSIS — M62561 Muscle wasting and atrophy, not elsewhere classified, right lower leg: Secondary | ICD-10-CM | POA: Diagnosis not present

## 2021-01-10 DIAGNOSIS — M810 Age-related osteoporosis without current pathological fracture: Secondary | ICD-10-CM | POA: Diagnosis not present

## 2021-01-10 DIAGNOSIS — M62838 Other muscle spasm: Secondary | ICD-10-CM | POA: Diagnosis not present

## 2021-01-10 DIAGNOSIS — N200 Calculus of kidney: Secondary | ICD-10-CM | POA: Diagnosis not present

## 2021-01-10 DIAGNOSIS — G4762 Sleep related leg cramps: Secondary | ICD-10-CM | POA: Diagnosis not present

## 2021-01-10 LAB — BASIC METABOLIC PANEL
BUN: 14 (ref 4–21)
CO2: 24 — AB (ref 13–22)
Chloride: 106 (ref 99–108)
Creatinine: 0.7 (ref 0.5–1.1)
Glucose: 83
Potassium: 4.7 (ref 3.4–5.3)
Sodium: 142 (ref 137–147)

## 2021-01-10 LAB — COMPREHENSIVE METABOLIC PANEL: Calcium: 8.9 (ref 8.7–10.7)

## 2021-01-11 DIAGNOSIS — M62561 Muscle wasting and atrophy, not elsewhere classified, right lower leg: Secondary | ICD-10-CM | POA: Diagnosis not present

## 2021-01-11 DIAGNOSIS — Z5189 Encounter for other specified aftercare: Secondary | ICD-10-CM | POA: Diagnosis not present

## 2021-01-11 DIAGNOSIS — J17 Pneumonia in diseases classified elsewhere: Secondary | ICD-10-CM | POA: Diagnosis not present

## 2021-01-11 DIAGNOSIS — M810 Age-related osteoporosis without current pathological fracture: Secondary | ICD-10-CM | POA: Diagnosis not present

## 2021-01-11 DIAGNOSIS — N200 Calculus of kidney: Secondary | ICD-10-CM | POA: Diagnosis not present

## 2021-01-11 DIAGNOSIS — R278 Other lack of coordination: Secondary | ICD-10-CM | POA: Diagnosis not present

## 2021-01-11 DIAGNOSIS — R2689 Other abnormalities of gait and mobility: Secondary | ICD-10-CM | POA: Diagnosis not present

## 2021-01-11 DIAGNOSIS — M62562 Muscle wasting and atrophy, not elsewhere classified, left lower leg: Secondary | ICD-10-CM | POA: Diagnosis not present

## 2021-01-13 ENCOUNTER — Telehealth: Payer: Self-pay | Admitting: Physician Assistant

## 2021-01-13 DIAGNOSIS — Z5189 Encounter for other specified aftercare: Secondary | ICD-10-CM | POA: Diagnosis not present

## 2021-01-13 DIAGNOSIS — R2689 Other abnormalities of gait and mobility: Secondary | ICD-10-CM | POA: Diagnosis not present

## 2021-01-13 DIAGNOSIS — M810 Age-related osteoporosis without current pathological fracture: Secondary | ICD-10-CM | POA: Diagnosis not present

## 2021-01-13 DIAGNOSIS — M62561 Muscle wasting and atrophy, not elsewhere classified, right lower leg: Secondary | ICD-10-CM | POA: Diagnosis not present

## 2021-01-13 DIAGNOSIS — N200 Calculus of kidney: Secondary | ICD-10-CM | POA: Diagnosis not present

## 2021-01-13 DIAGNOSIS — M62562 Muscle wasting and atrophy, not elsewhere classified, left lower leg: Secondary | ICD-10-CM | POA: Diagnosis not present

## 2021-01-13 DIAGNOSIS — J17 Pneumonia in diseases classified elsewhere: Secondary | ICD-10-CM | POA: Diagnosis not present

## 2021-01-13 DIAGNOSIS — R278 Other lack of coordination: Secondary | ICD-10-CM | POA: Diagnosis not present

## 2021-01-13 NOTE — Telephone Encounter (Signed)
Paged by RN from [facility]. Patient recently dealing with fluctuating blood pressure.  Her blood pressure was 146/80 6:30 AM when she took her losartan 100 mg. When RN check her blood pressure around 10 AM it was 171/80. Due to parameter of giving hydralazine to 10 mg if systolic blood pressure above 160, Hydralazine was administered. However her blood pressure dropped to 92/80. She was given fluids with improved blood pressure.   I have advised to continue current medication but not make changes every day. In elderly patient try to avoid hypotension. Advised only give hydralazine 5 mg if systolic blood pressure 332 mg.

## 2021-01-14 DIAGNOSIS — M62561 Muscle wasting and atrophy, not elsewhere classified, right lower leg: Secondary | ICD-10-CM | POA: Diagnosis not present

## 2021-01-14 DIAGNOSIS — M62562 Muscle wasting and atrophy, not elsewhere classified, left lower leg: Secondary | ICD-10-CM | POA: Diagnosis not present

## 2021-01-14 DIAGNOSIS — N200 Calculus of kidney: Secondary | ICD-10-CM | POA: Diagnosis not present

## 2021-01-14 DIAGNOSIS — R2689 Other abnormalities of gait and mobility: Secondary | ICD-10-CM | POA: Diagnosis not present

## 2021-01-14 DIAGNOSIS — J17 Pneumonia in diseases classified elsewhere: Secondary | ICD-10-CM | POA: Diagnosis not present

## 2021-01-14 DIAGNOSIS — M810 Age-related osteoporosis without current pathological fracture: Secondary | ICD-10-CM | POA: Diagnosis not present

## 2021-01-14 DIAGNOSIS — Z5189 Encounter for other specified aftercare: Secondary | ICD-10-CM | POA: Diagnosis not present

## 2021-01-14 DIAGNOSIS — R278 Other lack of coordination: Secondary | ICD-10-CM | POA: Diagnosis not present

## 2021-01-15 ENCOUNTER — Other Ambulatory Visit: Payer: Self-pay

## 2021-01-15 DIAGNOSIS — N200 Calculus of kidney: Secondary | ICD-10-CM | POA: Diagnosis not present

## 2021-01-15 DIAGNOSIS — J17 Pneumonia in diseases classified elsewhere: Secondary | ICD-10-CM | POA: Diagnosis not present

## 2021-01-15 DIAGNOSIS — R2689 Other abnormalities of gait and mobility: Secondary | ICD-10-CM | POA: Diagnosis not present

## 2021-01-15 DIAGNOSIS — M62561 Muscle wasting and atrophy, not elsewhere classified, right lower leg: Secondary | ICD-10-CM | POA: Diagnosis not present

## 2021-01-15 DIAGNOSIS — M62562 Muscle wasting and atrophy, not elsewhere classified, left lower leg: Secondary | ICD-10-CM | POA: Diagnosis not present

## 2021-01-15 DIAGNOSIS — H52203 Unspecified astigmatism, bilateral: Secondary | ICD-10-CM | POA: Diagnosis not present

## 2021-01-15 DIAGNOSIS — R278 Other lack of coordination: Secondary | ICD-10-CM | POA: Diagnosis not present

## 2021-01-15 DIAGNOSIS — H5010 Unspecified exotropia: Secondary | ICD-10-CM | POA: Diagnosis not present

## 2021-01-15 DIAGNOSIS — Z5189 Encounter for other specified aftercare: Secondary | ICD-10-CM | POA: Diagnosis not present

## 2021-01-15 DIAGNOSIS — Z961 Presence of intraocular lens: Secondary | ICD-10-CM | POA: Diagnosis not present

## 2021-01-15 DIAGNOSIS — M810 Age-related osteoporosis without current pathological fracture: Secondary | ICD-10-CM | POA: Diagnosis not present

## 2021-01-16 ENCOUNTER — Non-Acute Institutional Stay (SKILLED_NURSING_FACILITY): Payer: Medicare Other | Admitting: Internal Medicine

## 2021-01-16 ENCOUNTER — Encounter: Payer: Self-pay | Admitting: Internal Medicine

## 2021-01-16 DIAGNOSIS — J17 Pneumonia in diseases classified elsewhere: Secondary | ICD-10-CM | POA: Diagnosis not present

## 2021-01-16 DIAGNOSIS — R2681 Unsteadiness on feet: Secondary | ICD-10-CM

## 2021-01-16 DIAGNOSIS — E213 Hyperparathyroidism, unspecified: Secondary | ICD-10-CM

## 2021-01-16 DIAGNOSIS — K5904 Chronic idiopathic constipation: Secondary | ICD-10-CM

## 2021-01-16 DIAGNOSIS — H532 Diplopia: Secondary | ICD-10-CM

## 2021-01-16 DIAGNOSIS — N200 Calculus of kidney: Secondary | ICD-10-CM | POA: Diagnosis not present

## 2021-01-16 DIAGNOSIS — G301 Alzheimer's disease with late onset: Secondary | ICD-10-CM

## 2021-01-16 DIAGNOSIS — M810 Age-related osteoporosis without current pathological fracture: Secondary | ICD-10-CM | POA: Diagnosis not present

## 2021-01-16 DIAGNOSIS — R2689 Other abnormalities of gait and mobility: Secondary | ICD-10-CM | POA: Diagnosis not present

## 2021-01-16 DIAGNOSIS — M62561 Muscle wasting and atrophy, not elsewhere classified, right lower leg: Secondary | ICD-10-CM | POA: Diagnosis not present

## 2021-01-16 DIAGNOSIS — R278 Other lack of coordination: Secondary | ICD-10-CM | POA: Diagnosis not present

## 2021-01-16 DIAGNOSIS — M62562 Muscle wasting and atrophy, not elsewhere classified, left lower leg: Secondary | ICD-10-CM | POA: Diagnosis not present

## 2021-01-16 DIAGNOSIS — R531 Weakness: Secondary | ICD-10-CM

## 2021-01-16 DIAGNOSIS — R0989 Other specified symptoms and signs involving the circulatory and respiratory systems: Secondary | ICD-10-CM | POA: Diagnosis not present

## 2021-01-16 DIAGNOSIS — F0280 Dementia in other diseases classified elsewhere without behavioral disturbance: Secondary | ICD-10-CM

## 2021-01-16 DIAGNOSIS — Z5189 Encounter for other specified aftercare: Secondary | ICD-10-CM | POA: Diagnosis not present

## 2021-01-16 LAB — PTH, INTACT: PTH: 50.7

## 2021-01-16 NOTE — Progress Notes (Signed)
Location:  [place] Room Number: 153-A Place of Service:  SNF (31) Provider:  Bartley Vuolo L. Mariea Clonts, D.O., C.M.D.  Coralyn Mark, Altamese Cabal, MD  Patient Care Team: Lanice Shirts, MD as PCP - General (Internal Medicine) Buford Dresser, MD as PCP - Cardiology (Cardiology) Martinique, Amy, MD (Dermatology) [place], [place] Eduardo Osier., MD (Urology) Leighton Ruff, MD as Consulting Physician (General Surgery) Mauri Pole, MD as Consulting Physician (Gastroenterology)  No emergency contact information on file.  Code Status:  Full code Goals of care: Advanced Directive information Advanced Directives 01/16/2021  Does Patient Have a Medical Advance Directive? Yes  Type of Advance Directive Living will  Does patient want to make changes to medical advance directive? No - Patient declined  Copy of [place] in Chart? -  Would patient like information on creating a medical advance directive? -     Chief Complaint  Patient presents with  . Acute Visit    Blood pressure oscillation, living transition, confusion, anxiety    HPI:  Pt is a 80 y.o. female seen today for an acute visit for blood pressure oscillation, upcoming living arrangements, periods of confusion, tremors and to meet with her and her son.  [name] is eager to go home, but has good and bad days.  Her blood pressures are being taken in whatever position she is in and every 4 hrs so she's even being woken to check her blood pressure.  The high readings are when she's been lying down, it looks like and others before she's had her meds in the am. They were agreeable to bid bps one hour after meds.    As far as her disposition, with extensive discussion, she and her son agreed to a plan of a full week of 24x7 with [facility] and if they feel that is necessary for her going forward, she'd return to AL.    She has some tremor of her legs when she stretches them out.   It's not always there and she did not have it during this visit.    Her cognitive status also waxes and wanes and her insight into that is limited.  We discussed her valium use and its detrimental effects on cognition.  She's agreeable to a trial off.  It's uncertain exactly how much she truly used before admission and that may have contributed to her initial considerable delirium.  Past Medical History:  Diagnosis Date  . Abdominal pain, unspecified site   . ADVERSE REACTION TO MEDICATION 07/31/2009  . ANEMIA-NOS 07/07/2007  . Arthritis   . Chest pain, unspecified   . Chronic kidney disease    hx of kidney stone  . COLONIC POLYPS, ADENOMATOUS, HX OF   . Complication of anesthesia   . Disturbance of skin sensation   . DIVERTICULOSIS, COLON 02/23/2009  . Family history of diabetes mellitus   . Family history of malignant neoplasm of breast   . FATIGUE 05/25/2009  . Fever, unspecified   . Heart murmur    hx of  . HYPERLIPIDEMIA 07/07/2007  . HYPERPARATHYROIDISM UNSPECIFIED 10/20/2007  . HYPERTENSION 02/04/2008  . Irritable bowel syndrome (IBS)   . NEPHROLITHIASIS, HX OF 11/15/2008  . OSTEOPOROSIS 01/01/2008  . Other acquired absence of organ    parathyroidectomy  . Pain in limb   . PALPITATIONS, OCCASIONAL 07/12/2008  . PARATHYROIDECTOMY 01/02/2009  . PONV (postoperative nausea and vomiting)   . SCOLIOSIS 05/25/2009  . Seasonal allergies   . Unspecified constipation   .  URINARY INCONTINENCE 07/07/2007  . UTI'S, RECURRENT 11/20/2009   kimbrough    Past Surgical History:  Procedure Laterality Date  . BUNIONECTOMY    . COLONOSCOPY    . EXTRACORPOREAL SHOCK WAVE LITHOTRIPSY Left 12/21/2020   Procedure: EXTRACORPOREAL SHOCK WAVE LITHOTRIPSY (ESWL);  Surgeon: Festus Aloe, MD;  Location: [facility];  Service: Urology;  Laterality: Left;  . LAPAROSCOPIC APPENDECTOMY  01/19/2016   Procedure: APPENDECTOMY LAPAROSCOPIC with orifice of cecal polyp;  Surgeon: Leighton Ruff, MD;   Location: [facility];  Service: General;;  . LEFT HEART CATH AND CORONARY ANGIOGRAPHY N/A 04/07/2017   Procedure: Left Heart Cath and Coronary Angiography;  Surgeon: Nelva Bush, MD;  Location: [facility];  Service: Cardiovascular;  Laterality: N/A;  . PARATHYROIDECTOMY     Rt Superior open neck exploration  . POLYPECTOMY    . RIGHT OOPHORECTOMY    . TONSILLECTOMY      Allergies  Allergen Reactions  . Anesthetics, Amide Other (See Comments)    Pt states she had hallucinations and HTN after procedure 01/19/16-she feels may have been reaction to anesthetic  . Lisinopril Cough    Outpatient Encounter Medications as of 01/16/2021  Medication Sig  . buPROPion (WELLBUTRIN XL) 150 MG 24 hr tablet Take 150 mg by mouth at bedtime.  . hydrALAZINE (APRESOLINE) 10 MG tablet Take 5 mg by mouth 4 (four) times daily as needed (for SBP > 180).  [name] losartan (COZAAR) 50 MG tablet Take 25 mg by mouth 2 (two) times daily. Hold for SBP < 100  . ondansetron (ZOFRAN) 4 MG tablet Take 4 mg by mouth every 6 (six) hours as needed for nausea or vomiting.  . polyethylene glycol (MIRALAX / GLYCOLAX) 17 g packet Take 17 g by mouth daily. Give for 3 days and hold for 2 days  . senna (SENOKOT) 8.6 MG TABS tablet Take 2 tablets by mouth daily.  . traMADol (ULTRAM) 50 MG tablet Take 50 mg by mouth 2 (two) times daily as needed.  . [DISCONTINUED] bisacodyl (DULCOLAX) 10 MG suppository Place 10 mg rectally as needed for moderate constipation.  . [DISCONTINUED] diazepam (VALIUM) 2 MG tablet Take 2 mg by mouth at bedtime as needed (x 14 days).   No facility-administered encounter medications on file as of 01/16/2021.    Review of Systems  Constitutional: Negative for chills, fever and malaise/fatigue.  HENT: Positive for hearing loss. Negative for congestion and sore throat.   Eyes: Positive for double vision.  Respiratory: Negative for cough and shortness of breath.   Cardiovascular: Negative for chest pain,  palpitations and leg swelling.  Gastrointestinal: Positive for constipation. Negative for abdominal pain, blood in stool and melena.  Genitourinary: Negative for dysuria.  Musculoskeletal: Negative for falls and joint pain.  Skin: Negative for itching and rash.  Neurological: Positive for dizziness and weakness. Negative for loss of consciousness.  Endo/Heme/Allergies: Bruises/bleeds easily.  Psychiatric/Behavioral: Positive for memory loss. The patient is nervous/anxious. The patient does not have insomnia.        Grief    Immunization History  Administered Date(s) Administered  . Influenza Split 09/17/2011, 11/08/2015, 09/12/2017, 08/02/2020  . Influenza Whole 08/19/2008  . Influenza, High Dose Seasonal PF 08/20/2016, 09/12/2017, 08/21/2018, 09/28/2019  . Influenza-Unspecified 09/18/2013, 08/18/2014, 11/08/2015, 09/15/2020  . Moderna Sars-Covid-2 Vaccination 11/30/2019, 12/31/2019, 10/26/2020  . Pneumococcal Conjugate-13 12/29/2013  . Pneumococcal Polysaccharide-23 11/08/2015  . Tdap 01/05/2015   Pertinent  Health Maintenance Due  Topic Date Due  . COLONOSCOPY (Pts 45-68yr  Insurance coverage will need to be confirmed)  06/04/2019  . INFLUENZA VACCINE  Completed  . DEXA SCAN  Completed  . PNA vac Low Risk Adult  Completed   Fall Risk  01/05/2015 12/29/2013  Falls in the past year? No No   Functional Status Survey:    Vitals:   01/16/21 1614  BP: (!) 180/82  Pulse: 64  Resp: 18  Temp: 99 F (37.2 C)  SpO2: 96%  Weight: 105 lb 12.8 oz (48 kg)  Height: 5' (1.524 m)   Body mass index is 20.66 kg/m. Physical Exam Vitals reviewed.  Constitutional:      General: She is not in acute distress.    Appearance: Normal appearance. She is not toxic-appearing.  HENT:     Head: Normocephalic and atraumatic.  Eyes:     Conjunctiva/sclera: Conjunctivae normal.     Pupils: Pupils are equal, round, and reactive to light.     Comments: Glasses, diplopia  Cardiovascular:      Rate and Rhythm: Rhythm irregular.     Heart sounds: No murmur heard.   Pulmonary:     Effort: Pulmonary effort is normal.     Breath sounds: Normal breath sounds. No wheezing, rhonchi or rales.  Abdominal:     General: Bowel sounds are normal. There is no distension.     Palpations: Abdomen is soft.     Tenderness: There is no abdominal tenderness. There is no guarding or rebound.  Musculoskeletal:        General: Normal range of motion.  Skin:    General: Skin is warm and dry.  Neurological:     General: No focal deficit present.     Mental Status: She is alert and oriented to person, place, and time.     Gait: Gait abnormal.     Comments: Some short-term memory loss--repeats some things over visit; using walker--encouraged regular use  Psychiatric:        Mood and Affect: Mood normal.     Labs reviewed: Recent Labs    12/29/20 0000 01/10/21 0000  NA 142 142  K 3.6 4.7  CL 102 106  CO2 27* 24*  BUN 8 14  CREATININE 0.6 0.7  CALCIUM 8.7 8.9   No results for input(s): AST, ALT, ALKPHOS, BILITOT, PROT, ALBUMIN in the last 8760 hours. Recent Labs    12/29/20 0000  WBC 5.5  HGB 12.5  HCT 36   Lab Results  Component Value Date   TSH 2.808 02/09/2017   No results found for: HGBA1C Lab Results  Component Value Date   CHOL 132 01/25/2015   HDL 68 01/25/2015   LDLCALC 51 01/25/2015   LDLDIRECT 156.3 03/28/2011   TRIG 64 01/25/2015   CHOLHDL 1.9 01/25/2015    Significant Diagnostic Results in last 30 days:  No results found.  Assessment/Plan 1. Labile blood pressure -bid checks after meds instead of every 4 hrs which causes her anxiety and must be done seated and with manual cuff each time in same arm  2. Chronic idiopathic constipation -cont bowel regimen  3. Diplopia -to get prisms in lenses  4. Unsteady gait when walking -cont regular walker use  5. HYPERPARATHYROIDISM UNSPECIFIED -likely etiology of stones -monitor ca and pth  6. Late onset  Alzheimer's dementia without behavioral disturbance ([employer]) -requiring more assistance -recommendation is for 24 hr care at home or move to AL -they're going to try a week of 24 hr care in [place] and see how that  goes--if 24 hr care needs continue, move to AL makes more sense  7. Weakness -cont PT, OT exercises and interventions -cont to use walker at all times -met with resident and her son today for 45 mins  Labs/tests ordered:  No new today  Sherrise Liberto L. Arvie Villarruel, D.O. [facility] [address], [place] [address] Cell Phone (Mon-Fri 8am-5pm):  [phone] On Call:  [phone] & follow prompts after 5pm & weekends Office Phone:  [phone] Office Fax:  [phone]

## 2021-01-17 ENCOUNTER — Encounter: Payer: Self-pay | Admitting: *Deleted

## 2021-01-17 DIAGNOSIS — M810 Age-related osteoporosis without current pathological fracture: Secondary | ICD-10-CM | POA: Diagnosis not present

## 2021-01-17 DIAGNOSIS — Z5189 Encounter for other specified aftercare: Secondary | ICD-10-CM | POA: Diagnosis not present

## 2021-01-17 DIAGNOSIS — R278 Other lack of coordination: Secondary | ICD-10-CM | POA: Diagnosis not present

## 2021-01-17 DIAGNOSIS — M62562 Muscle wasting and atrophy, not elsewhere classified, left lower leg: Secondary | ICD-10-CM | POA: Diagnosis not present

## 2021-01-17 DIAGNOSIS — R2689 Other abnormalities of gait and mobility: Secondary | ICD-10-CM | POA: Diagnosis not present

## 2021-01-17 DIAGNOSIS — N200 Calculus of kidney: Secondary | ICD-10-CM | POA: Diagnosis not present

## 2021-01-17 DIAGNOSIS — M62561 Muscle wasting and atrophy, not elsewhere classified, right lower leg: Secondary | ICD-10-CM | POA: Diagnosis not present

## 2021-01-17 DIAGNOSIS — J17 Pneumonia in diseases classified elsewhere: Secondary | ICD-10-CM | POA: Diagnosis not present

## 2021-01-18 DIAGNOSIS — Z5189 Encounter for other specified aftercare: Secondary | ICD-10-CM | POA: Diagnosis not present

## 2021-01-18 DIAGNOSIS — M62562 Muscle wasting and atrophy, not elsewhere classified, left lower leg: Secondary | ICD-10-CM | POA: Diagnosis not present

## 2021-01-18 DIAGNOSIS — J17 Pneumonia in diseases classified elsewhere: Secondary | ICD-10-CM | POA: Diagnosis not present

## 2021-01-18 DIAGNOSIS — M62561 Muscle wasting and atrophy, not elsewhere classified, right lower leg: Secondary | ICD-10-CM | POA: Diagnosis not present

## 2021-01-18 DIAGNOSIS — R2689 Other abnormalities of gait and mobility: Secondary | ICD-10-CM | POA: Diagnosis not present

## 2021-01-18 DIAGNOSIS — R278 Other lack of coordination: Secondary | ICD-10-CM | POA: Diagnosis not present

## 2021-01-18 DIAGNOSIS — M810 Age-related osteoporosis without current pathological fracture: Secondary | ICD-10-CM | POA: Diagnosis not present

## 2021-01-18 DIAGNOSIS — N200 Calculus of kidney: Secondary | ICD-10-CM | POA: Diagnosis not present

## 2021-01-19 ENCOUNTER — Ambulatory Visit: Payer: Medicare Other | Admitting: Nurse Practitioner

## 2021-01-19 DIAGNOSIS — F0280 Dementia in other diseases classified elsewhere without behavioral disturbance: Secondary | ICD-10-CM | POA: Insufficient documentation

## 2021-01-19 DIAGNOSIS — N200 Calculus of kidney: Secondary | ICD-10-CM | POA: Diagnosis not present

## 2021-01-19 DIAGNOSIS — K5904 Chronic idiopathic constipation: Secondary | ICD-10-CM | POA: Insufficient documentation

## 2021-01-19 DIAGNOSIS — M62561 Muscle wasting and atrophy, not elsewhere classified, right lower leg: Secondary | ICD-10-CM | POA: Diagnosis not present

## 2021-01-19 DIAGNOSIS — R2689 Other abnormalities of gait and mobility: Secondary | ICD-10-CM | POA: Diagnosis not present

## 2021-01-19 DIAGNOSIS — J17 Pneumonia in diseases classified elsewhere: Secondary | ICD-10-CM | POA: Diagnosis not present

## 2021-01-19 DIAGNOSIS — Z5189 Encounter for other specified aftercare: Secondary | ICD-10-CM | POA: Diagnosis not present

## 2021-01-19 DIAGNOSIS — H532 Diplopia: Secondary | ICD-10-CM | POA: Insufficient documentation

## 2021-01-19 DIAGNOSIS — R278 Other lack of coordination: Secondary | ICD-10-CM | POA: Diagnosis not present

## 2021-01-19 DIAGNOSIS — G301 Alzheimer's disease with late onset: Secondary | ICD-10-CM | POA: Insufficient documentation

## 2021-01-19 DIAGNOSIS — M810 Age-related osteoporosis without current pathological fracture: Secondary | ICD-10-CM | POA: Diagnosis not present

## 2021-01-19 DIAGNOSIS — M62562 Muscle wasting and atrophy, not elsewhere classified, left lower leg: Secondary | ICD-10-CM | POA: Diagnosis not present

## 2021-01-19 DIAGNOSIS — R0989 Other specified symptoms and signs involving the circulatory and respiratory systems: Secondary | ICD-10-CM | POA: Insufficient documentation

## 2021-01-19 DIAGNOSIS — R2681 Unsteadiness on feet: Secondary | ICD-10-CM | POA: Insufficient documentation

## 2021-01-23 DIAGNOSIS — M62562 Muscle wasting and atrophy, not elsewhere classified, left lower leg: Secondary | ICD-10-CM | POA: Diagnosis not present

## 2021-01-23 DIAGNOSIS — M810 Age-related osteoporosis without current pathological fracture: Secondary | ICD-10-CM | POA: Diagnosis not present

## 2021-01-23 DIAGNOSIS — J17 Pneumonia in diseases classified elsewhere: Secondary | ICD-10-CM | POA: Diagnosis not present

## 2021-01-23 DIAGNOSIS — N200 Calculus of kidney: Secondary | ICD-10-CM | POA: Diagnosis not present

## 2021-01-23 DIAGNOSIS — R278 Other lack of coordination: Secondary | ICD-10-CM | POA: Diagnosis not present

## 2021-01-23 DIAGNOSIS — Z5189 Encounter for other specified aftercare: Secondary | ICD-10-CM | POA: Diagnosis not present

## 2021-01-23 DIAGNOSIS — M62561 Muscle wasting and atrophy, not elsewhere classified, right lower leg: Secondary | ICD-10-CM | POA: Diagnosis not present

## 2021-01-23 DIAGNOSIS — R2689 Other abnormalities of gait and mobility: Secondary | ICD-10-CM | POA: Diagnosis not present

## 2021-01-24 ENCOUNTER — Other Ambulatory Visit: Payer: Self-pay

## 2021-01-24 ENCOUNTER — Ambulatory Visit (INDEPENDENT_AMBULATORY_CARE_PROVIDER_SITE_OTHER): Payer: Medicare Other | Admitting: Cardiology

## 2021-01-24 ENCOUNTER — Encounter: Payer: Self-pay | Admitting: Cardiology

## 2021-01-24 VITALS — BP 110/76 | HR 70 | Ht 60.0 in | Wt 108.0 lb

## 2021-01-24 DIAGNOSIS — I10 Essential (primary) hypertension: Secondary | ICD-10-CM | POA: Diagnosis not present

## 2021-01-24 DIAGNOSIS — Z7189 Other specified counseling: Secondary | ICD-10-CM | POA: Diagnosis not present

## 2021-01-24 DIAGNOSIS — R0989 Other specified symptoms and signs involving the circulatory and respiratory systems: Secondary | ICD-10-CM | POA: Diagnosis not present

## 2021-01-24 DIAGNOSIS — I2510 Atherosclerotic heart disease of native coronary artery without angina pectoris: Secondary | ICD-10-CM

## 2021-01-24 NOTE — Progress Notes (Signed)
Cardiology Office Note:    Date:  01/24/2021   ID:  [name], DOB 11-15-1941, MRN [id number]  PCP:  Wenda Low, MD  Cardiologist:  Buford Dresser, MD PhD  Referring MD: Lanice Shirts, *   CC: follow up  History of Present Illness:    [name] is a 80 y.o. female with a hx of HTN, CAD, HLD, paroxysmal atrial fibrillation (during hospitalization in 2017) who is seen for follow up today. I initially met her 07/19/2019 as a new patient to me/prior Dr. Saunders Revel for the evaluation and management of cardiovascular disease.  Today: Seen by Dr. Mariea Clonts 01/16/21, noted to have blood pressure fluctuations. Recommended to check blood pressures twice a day instead of every 4 hours.  Here with son and daughter today. Has questions about how best to take medications, timing, etc.   Now taking losartan 50 mg twice daily. No longer taking hydralazine. However, the way the instructions are written, she gets at least 50 mg twice daily, and if her BP systolic is >341, she gets an extra losartan 50 mg (can be in the morning, at night, or both).  Had a difficult several months, lost her husband, had kidney stones and pneumonia. Discussed how life stress can impact health.  Denies chest pain, shortness of breath at rest or with normal exertion. No PND, orthopnea, LE edema or unexpected weight gain. No syncope or palpitations.    Past Medical History:  Diagnosis Date   Abdominal pain, unspecified site    ADVERSE REACTION TO MEDICATION 07/31/2009   ANEMIA-NOS 07/07/2007   Arthritis    Chest pain, unspecified    Chronic kidney disease    hx of kidney stone   COLONIC POLYPS, ADENOMATOUS, HX OF    Complication of anesthesia    Disturbance of skin sensation    DIVERTICULOSIS, COLON 02/23/2009   Family history of diabetes mellitus    Family history of malignant neoplasm of breast    FATIGUE 05/25/2009   Fever, unspecified    Heart murmur    hx of   HYPERLIPIDEMIA 07/07/2007    HYPERPARATHYROIDISM UNSPECIFIED 10/20/2007   HYPERTENSION 02/04/2008   Irritable bowel syndrome (IBS)    NEPHROLITHIASIS, HX OF 11/15/2008   OSTEOPOROSIS 01/01/2008   Other acquired absence of organ    parathyroidectomy   Pain in limb    PALPITATIONS, OCCASIONAL 07/12/2008   PARATHYROIDECTOMY 01/02/2009   PONV (postoperative nausea and vomiting)    SCOLIOSIS 05/25/2009   Seasonal allergies    Unspecified constipation    URINARY INCONTINENCE 07/07/2007   UTI'S, RECURRENT 11/20/2009   kimbrough     Past Surgical History:  Procedure Laterality Date   BUNIONECTOMY     COLONOSCOPY     EXTRACORPOREAL SHOCK WAVE LITHOTRIPSY Left 12/21/2020   Procedure: EXTRACORPOREAL SHOCK WAVE LITHOTRIPSY (ESWL);  Surgeon: Festus Aloe, MD;  Location: [facility];  Service: Urology;  Laterality: Left;   LAPAROSCOPIC APPENDECTOMY  01/19/2016   Procedure: APPENDECTOMY LAPAROSCOPIC with orifice of cecal polyp;  Surgeon: Leighton Ruff, MD;  Location: [facility];  Service: General;;   LEFT HEART CATH AND CORONARY ANGIOGRAPHY N/A 04/07/2017   Procedure: Left Heart Cath and Coronary Angiography;  Surgeon: Nelva Bush, MD;  Location: [facility];  Service: Cardiovascular;  Laterality: N/A;   PARATHYROIDECTOMY     Rt Superior open neck exploration   POLYPECTOMY     RIGHT OOPHORECTOMY     TONSILLECTOMY      Current Medications: Current Outpatient  Medications on File Prior to Visit  Medication Sig   hydrALAZINE (APRESOLINE) 10 MG tablet Take 5 mg by mouth 4 (four) times daily as needed (for SBP > 180).   polyethylene glycol (MIRALAX / GLYCOLAX) 17 g packet Take 17 g by mouth daily. Give for 3 days and hold for 2 days   No current facility-administered medications on file prior to visit.     Allergies:   Anesthetics, amide and Lisinopril   Social History   Tobacco Use   Smoking status: Never Smoker   Smokeless tobacco: Never Used  Vaping Use   Vaping Use: Never used  Substance Use  Topics   Alcohol use: Yes    Comment: 1 bottle wine week   Drug use: No    Family History: The patient's family history includes Breast cancer in her paternal aunt; Diabetes in an other family member; Heart disease in her mother; Heart disease (age of onset: 78) in her father; Sudden death (age of onset: 53) in her father. There is no history of Colon cancer or Stomach cancer.  ROS:   Please see the history of present illness.  Additional pertinent ROS otherwise unremarkable.   EKGs/Labs/Other Studies Reviewed:    The following studies were reviewed today: LHC (04/07/17): Minimal CAD involving large epicardial coronary arteries. Mild to moderate stenosis of diagonal branches is evident. Normal LV contraction. Normal LV filling pressure. Small right radial artery.   Pharmacologic myocardial perfusion stress test (03/24/17): High risk study with small in size, mild in severity, basal inferoseptal and mid inferoseptal defects that could reflect diaphragmatic attenuation and/or ischemia. Is also a small in size, moderate in severity, partially reversible apical defect. Moderate in size, moderate in severity, basal anteroseptal, mid anteroseptal, and apical septal defect is reversible and concerning for ischemia. LVEF 67%.   Holter monitor (02/18/17): Predominantly sinus rhythm with rare PACs and PVCs. No significant arrhythmias.  EKG:  EKG is personally reviewed.  The ekg ordered 11/22/20 demonstrates sinus bradycardia at 56 bpm  Recent Labs: 12/29/2020: Hemoglobin 12.5 01/10/2021: BUN 14; Creatinine 0.7; Potassium 4.7; Sodium 142  Recent Lipid Panel    Component Value Date/Time   CHOL 132 01/25/2015 1112   TRIG 64 01/25/2015 1112   HDL 68 01/25/2015 1112   CHOLHDL 1.9 01/25/2015 1112   VLDL 13 01/25/2015 1112   LDLCALC 51 01/25/2015 1112   LDLDIRECT 156.3 03/28/2011 1038    Physical Exam:    VS:  BP 110/76 (BP Location: Left Arm, Patient Position: Sitting, Cuff Size: Normal)   Pulse 70    Ht 5' (1.524 m)   Wt 108 lb (49 kg)   BMI 21.09 kg/m     Wt Readings from Last 3 Encounters:  01/24/21 108 lb (49 kg)  01/16/21 105 lb 12.8 oz (48 kg)  01/09/21 106 lb (48.1 kg)    GEN: Well nourished, well developed in no acute distress HEENT: Normal, moist mucous membranes NECK: No JVD CARDIAC: regular rhythm, normal S1 and S2, no rubs or gallops. No murmur. VASCULAR: Radial and DP pulses 2+ bilaterally. No carotid bruits RESPIRATORY:  Clear to auscultation without rales, wheezing or rhonchi  ABDOMEN: Soft, non-tender, non-distended MUSCULOSKELETAL:  Ambulates independently SKIN: Warm and dry, no edema NEUROLOGIC:  Alert and oriented x 3. No focal neuro deficits noted. PSYCHIATRIC:  Normal affect    ASSESSMENT:    1. Essential hypertension   2. Labile hypertension   3. Nonocclusive coronary atherosclerosis of native coronary artery   4. Counseling  on health promotion and disease prevention    PLAN:    nonobstructive CAD  -on cath 2018 -continue aspirin, atorvastatin -counseled on red flag warning signs that need immediate medical attention  Hypertension: labile -on losartan 100 mg daily (50 mg BID). Per notes 01/09/21 from Dr. Mariea Clonts, this was increased (noted at 100 mg BID, patient reports it is 50 mg BID). Has had severe elevations, but when treated with PRNS her blood pressure drops precipitously -we discussed triggers for elevated BP at length. She notes that if she relaxes, takes a deep breath, or takes valium, her BP improves. We discussed how the fight or flight response can affect BP. She has had a lot of stress in her life recently.  -she also has intermittent low readings, so there is risk to overtreatment. We discussed the danger of this.  -we reviewed how best to check blood pressure. Would not use PRNs such as hydralazine, clonidine if this can be avoided. If BP elevated, would focus on treating possible secondary factors and rechecking.  -if BP remains  consistently elevated, could add low dose amlodipine. Should not receive more than 100 mg total losartan daily. Could also trial another ARB.  Cardiac risk counseling and prevention recommendations: -recommend heart healthy/Mediterranean diet, with whole grains, fruits, vegetable, fish, lean meats, nuts, and olive oil. Limit salt. -recommend moderate walking, 3-5 times/week for 30-50 minutes each session. Aim for at least 150 minutes.week. Goal should be pace of 3 miles/hours, or walking 1.5 miles in 30 minutes -recommend avoidance of tobacco products. Avoid excess alcohol. -on aspirin 81 mg daily per personal preference  Plan for follow up: 3 weeks virtual to review BP numbers and symptoms.  Medication Adjustments/Labs and Tests Ordered: Current medicines are reviewed at length with the patient today.  Concerns regarding medicines are outlined above.  No orders of the defined types were placed in this encounter.  No orders of the defined types were placed in this encounter.   Patient Instructions  Medication Instructions:  Take losartan 50 mg twice a day. Take blood pressure once a day, at least one hour after taking morning medication. Keep a log. We will review the trend in a few weeks. If your blood pressures are significantly elevated at that time, we will discuss adding back amlodipine in addition to the current losartan dose. You should not receive more than a total of 100 mg losartan daily.   Stop hydralazine  *If you need a refill on your cardiac medications before your next appointment, please call your pharmacy*   Lab Work: None   Testing/Procedures: None   Follow-Up: At [facility], you and your health needs are our priority.  As part of our continuing mission to provide you with exceptional heart care, we have created designated Provider Care Teams.  These Care Teams include your primary Cardiologist (physician) and Advanced Practice Providers (APPs -  Physician  Assistants and Nurse Practitioners) who all work together to provide you with the care you need, when you need it.  We recommend signing up for the patient portal called "MyChart".  Sign up information is provided on this After Visit Summary.  MyChart is used to connect with patients for Virtual Visits (Telemedicine).  Patients are able to view lab/test results, encounter notes, upcoming appointments, etc.  Non-urgent messages can be sent to your provider as well.   To learn more about what you can do with MyChart, go to [email or web address].    Your next appointment:   3  week(s)  The format for your next appointment:   Virtual Visit   Provider:   Buford Dresser, MD     Signed, Buford Dresser, MD PhD 01/24/2021    [place]

## 2021-01-24 NOTE — Patient Instructions (Addendum)
Medication Instructions:  Take losartan 50 mg twice a day. Take blood pressure once a day, at least one hour after taking morning medication. Keep a log. We will review the trend in a few weeks. If your blood pressures are significantly elevated at that time, we will discuss adding back amlodipine in addition to the current losartan dose. You should not receive more than a total of 100 mg losartan daily.   Stop hydralazine  *If you need a refill on your cardiac medications before your next appointment, please call your pharmacy*   Lab Work: None   Testing/Procedures: None   Follow-Up: At [facility], you and your health needs are our priority.  As part of our continuing mission to provide you with exceptional heart care, we have created designated Provider Care Teams.  These Care Teams include your primary Cardiologist (physician) and Advanced Practice Providers (APPs -  Physician Assistants and Nurse Practitioners) who all work together to provide you with the care you need, when you need it.  We recommend signing up for the patient portal called "MyChart".  Sign up information is provided on this After Visit Summary.  MyChart is used to connect with patients for Virtual Visits (Telemedicine).  Patients are able to view lab/test results, encounter notes, upcoming appointments, etc.  Non-urgent messages can be sent to your provider as well.   To learn more about what you can do with MyChart, go to [email or web address].    Your next appointment:   3 week(s)  The format for your next appointment:   Virtual Visit   Provider:   Buford Dresser, MD

## 2021-01-25 DIAGNOSIS — F419 Anxiety disorder, unspecified: Secondary | ICD-10-CM | POA: Diagnosis not present

## 2021-01-25 DIAGNOSIS — R251 Tremor, unspecified: Secondary | ICD-10-CM | POA: Diagnosis not present

## 2021-01-25 DIAGNOSIS — I951 Orthostatic hypotension: Secondary | ICD-10-CM | POA: Diagnosis not present

## 2021-01-25 DIAGNOSIS — I10 Essential (primary) hypertension: Secondary | ICD-10-CM | POA: Diagnosis not present

## 2021-01-25 DIAGNOSIS — R269 Unspecified abnormalities of gait and mobility: Secondary | ICD-10-CM | POA: Diagnosis not present

## 2021-01-26 ENCOUNTER — Encounter: Payer: Self-pay | Admitting: Neurology

## 2021-01-26 NOTE — Progress Notes (Signed)
Assessment/Plan:   1.  Tremor  -didn't see any significant tremor today.  Reassured pt/son that she doesn't have Parkinsons Disease and pt seemed relieved about that.  -they asked about anxiety playing a role and it certainly could be.  Her husband just passed away.  Again, hard to comment on as I didn't see any significant tremor today  -will check tsh  2. Diplopia  -will check AchR ab's  -has been stable for years and has been f/u with ophth and likely just reflects convergence issues  3.  Memory change  -They asked me about driving at the very end of the visit as the PT told them that she could not drive.  Told them that this was not something we were asked to evaluate today, but if so, we would do neurocognitive testing and recommend a driving evaluation.  They declined that, but her son told her he thought she could drive.  I was clear when I talked to the patient that this was not my recommendation since I did not evaluate that and did not have any neurocognitive testing or medical driving evaluation on her.   Subjective:   [name] was seen today in the movement disorders clinic for neurologic consultation at the request of Wenda Low, MD.  The consultation is for the evaluation of tremor.  Records made available to me are reviewed.  Patient was recently in [place], recovering after being in the hospital for lithotripsy.  Tremors were discussed with Dr. Mariea Clonts during her nursing facility stay, but apparently were not noted on examination.  There is also noted that the patient's "cognitive status also waxes and wanes and her insight into that is limited."  It was felt that Valium was contributing to that.  It was recommended that she have 24 hour/day care at home or move to [place] with her family.  Tremor: Yes.   worse since lithotripsy and subsequent complications of urinary tract infection and pneumonia as well as added stress of anxiety/grief  (husband just passed away).  How long has it been going on? 2-3 years ago it started intermittently but started daily after lithotripsty  At rest or with activation?  "the tremor is from shivering."  States its in hands and legs  Fam hx of tremor?  No.  Located where?  R>L leg; bilateral UE  Affected by caffeine: doesn't drink enough to know  Affected by alcohol:  No.  Affected by stress:  Yes.    Affected by fatigue:  Yes.    Spills soup if on spoon:  No.  Spills glass of liquid if full:  No.  Affects ADL's (tying shoes, brushing teeth, etc):  No.  Tremor inducing meds:  No.  Other Specific Symptoms:  Voice: no change Sleep: sleeps well  Vivid Dreams:  No.  Acting out dreams:  No. Wet Pillows: Yes.   Postural symptoms:  Yes.  , states that it is b/c "I am weak."  Falls?  Yes.  , last was because she went to sit and missed the couch and fell and teeth went through the lip; prior fall before that was a year before and tripped over carpet.  Prior one fell outside about a year ago.   Bradykinesia symptoms: shuffling gait Loss of smell:  Yes.   Loss of taste:  No. Urinary Incontinence:  Yes.   Difficulty Swallowing:  No. Handwriting, micrographia: No. Trouble with ADL's:  No.  Trouble buttoning clothing: No. Depression:  Yes.  , husband just died Memory changes:  Yes.   (trouble with remembering appts; family does chart to remember meds) Hallucinations:  No.  visual distortions: Yes.   N/V:  No. Lightheaded:  Yes.    Syncope: no Diplopia:  Yes.   , goes away if shuts away (RX prisms x 2 years ago but doesn't wear it) Dyskinesia:  No.   ALLERGIES:   Allergies  Allergen Reactions  . Anesthetics, Amide Other (See Comments)    Pt states she had hallucinations and HTN after procedure 01/19/16-she feels may have been reaction to anesthetic  . Lisinopril Cough    CURRENT MEDICATIONS:  Current Outpatient Medications  Medication Instructions  . losartan (COZAAR) 50 mg, Oral, 2  times daily  . polyethylene glycol (MIRALAX / GLYCOLAX) 17 g, Oral, Daily, Give for 3 days and hold for 2 days    Objective:   PHYSICAL EXAMINATION:    VITALS:   Vitals:   01/30/21 0957  BP: (!) 84/62  Pulse: 82  SpO2: 99%  Weight: 108 lb (49 kg)  Height: 4\' 11"  (1.499 m)    GEN:  The patient appears stated age and is in NAD. HEENT:  Normocephalic, atraumatic.  The mucous membranes are moist. The superficial temporal arteries are without ropiness or tenderness. CV:  RRR Lungs:  CTAB Neck/HEME:  There are no carotid bruits bilaterally.  Neurological examination:  Orientation: The patient is alert and oriented x3.  Cranial nerves: There is good facial symmetry.  Extraocular muscles are intact. The visual fields are full to confrontational testing. The speech is fluent and clear. Soft palate rises symmetrically and there is no tongue deviation. Hearing is intact to conversational tone. Sensation: Sensation is intact to light touch throughout (facial, trunk, extremities). Vibration is intact at the bilateral big toe. There is no extinction with double simultaneous stimulation.  Motor: Strength is 5/5 in the bilateral upper and lower extremities.   Shoulder shrug is equal and symmetric.  There is no pronator drift. Deep tendon reflexes: Deep tendon reflexes are 2/4 at the bilateral biceps, triceps, brachioradialis, patella and achilles. Plantar responses are downgoing bilaterally.  Movement examination: Tone: There is normal tone in the bilateral upper extremities.  The tone in the lower extremities is normal.  Abnormal movements: No rest tremor, even with distraction procedures.  No postural tremor.  No intention tremor.  No trouble with Archimedes spirals. Coordination:  There is no decremation with RAM's, with any form of RAMS, including alternating supination and pronation of the forearm, hand opening and closing, finger taps, heel taps and toe taps. Gait and Station: The patient  pushes off of the chair to arise.  She walks well with her walker.  Without the walker, she is slow, but does not shuffle. I have reviewed and interpreted the following labs independently   Chemistry      Component Value Date/Time   NA 142 01/10/2021 0000   K 4.7 01/10/2021 0000   CL 106 01/10/2021 0000   CO2 24 (A) 01/10/2021 0000   BUN 14 01/10/2021 0000   CREATININE 0.7 01/10/2021 0000   CREATININE 0.70 12/18/2017 0509   CREATININE 0.64 01/25/2015 1112   GLU 83 01/10/2021 0000      Component Value Date/Time   CALCIUM 8.9 01/10/2021 0000   CALCIUM 9.2 09/07/2010 2148   ALKPHOS 57 12/18/2017 0509   AST 29 12/18/2017 0509   ALT 18 12/18/2017 0509   BILITOT 1.0 12/18/2017 0509  Lab Results  Component Value Date   TSH 2.808 02/09/2017   Lab Results  Component Value Date   WBC 5.5 12/29/2020   HGB 12.5 12/29/2020   HCT 36 12/29/2020   MCV 94.4 12/18/2017   PLT 165 12/18/2017      Total time spent on today's visit was 60 minutes, including both face-to-face time and nonface-to-face time.  Time included that spent on review of records (prior notes available to me/labs/imaging if pertinent), discussing treatment and goals, answering patient's questions and coordinating care.  Cc:  Wenda Low, MD

## 2021-01-29 DIAGNOSIS — M62561 Muscle wasting and atrophy, not elsewhere classified, right lower leg: Secondary | ICD-10-CM | POA: Diagnosis not present

## 2021-01-29 DIAGNOSIS — M62562 Muscle wasting and atrophy, not elsewhere classified, left lower leg: Secondary | ICD-10-CM | POA: Diagnosis not present

## 2021-01-29 DIAGNOSIS — R2689 Other abnormalities of gait and mobility: Secondary | ICD-10-CM | POA: Diagnosis not present

## 2021-01-29 DIAGNOSIS — Z5189 Encounter for other specified aftercare: Secondary | ICD-10-CM | POA: Diagnosis not present

## 2021-01-29 DIAGNOSIS — M810 Age-related osteoporosis without current pathological fracture: Secondary | ICD-10-CM | POA: Diagnosis not present

## 2021-01-29 DIAGNOSIS — N200 Calculus of kidney: Secondary | ICD-10-CM | POA: Diagnosis not present

## 2021-01-29 DIAGNOSIS — R278 Other lack of coordination: Secondary | ICD-10-CM | POA: Diagnosis not present

## 2021-01-29 DIAGNOSIS — J17 Pneumonia in diseases classified elsewhere: Secondary | ICD-10-CM | POA: Diagnosis not present

## 2021-01-30 ENCOUNTER — Other Ambulatory Visit: Payer: Medicare Other

## 2021-01-30 ENCOUNTER — Other Ambulatory Visit: Payer: Self-pay

## 2021-01-30 ENCOUNTER — Encounter: Payer: Self-pay | Admitting: Neurology

## 2021-01-30 ENCOUNTER — Ambulatory Visit (INDEPENDENT_AMBULATORY_CARE_PROVIDER_SITE_OTHER): Payer: Medicare Other | Admitting: Neurology

## 2021-01-30 VITALS — BP 84/62 | HR 82 | Ht 59.0 in | Wt 108.0 lb

## 2021-01-30 DIAGNOSIS — R251 Tremor, unspecified: Secondary | ICD-10-CM | POA: Diagnosis not present

## 2021-01-30 DIAGNOSIS — I2510 Atherosclerotic heart disease of native coronary artery without angina pectoris: Secondary | ICD-10-CM | POA: Diagnosis not present

## 2021-01-30 DIAGNOSIS — H532 Diplopia: Secondary | ICD-10-CM | POA: Diagnosis not present

## 2021-01-30 LAB — TSH: TSH: 1.42 u[IU]/mL (ref 0.35–4.50)

## 2021-01-30 NOTE — Patient Instructions (Addendum)
Your provider has requested that you have labwork completed today. Please go to [facility] ([id number]) on the second floor of this building before leaving the office today. You do not need to check in. If you are not called within 15 minutes please check with the front desk.   

## 2021-02-05 DIAGNOSIS — I10 Essential (primary) hypertension: Secondary | ICD-10-CM | POA: Diagnosis not present

## 2021-02-05 DIAGNOSIS — R251 Tremor, unspecified: Secondary | ICD-10-CM | POA: Diagnosis not present

## 2021-02-05 DIAGNOSIS — N200 Calculus of kidney: Secondary | ICD-10-CM | POA: Diagnosis not present

## 2021-02-05 DIAGNOSIS — Z5189 Encounter for other specified aftercare: Secondary | ICD-10-CM | POA: Diagnosis not present

## 2021-02-05 DIAGNOSIS — R0989 Other specified symptoms and signs involving the circulatory and respiratory systems: Secondary | ICD-10-CM | POA: Diagnosis not present

## 2021-02-05 DIAGNOSIS — J17 Pneumonia in diseases classified elsewhere: Secondary | ICD-10-CM | POA: Diagnosis not present

## 2021-02-05 DIAGNOSIS — M810 Age-related osteoporosis without current pathological fracture: Secondary | ICD-10-CM | POA: Diagnosis not present

## 2021-02-05 DIAGNOSIS — R2689 Other abnormalities of gait and mobility: Secondary | ICD-10-CM | POA: Diagnosis not present

## 2021-02-05 DIAGNOSIS — M62562 Muscle wasting and atrophy, not elsewhere classified, left lower leg: Secondary | ICD-10-CM | POA: Diagnosis not present

## 2021-02-05 DIAGNOSIS — R278 Other lack of coordination: Secondary | ICD-10-CM | POA: Diagnosis not present

## 2021-02-05 DIAGNOSIS — M62561 Muscle wasting and atrophy, not elsewhere classified, right lower leg: Secondary | ICD-10-CM | POA: Diagnosis not present

## 2021-02-06 DIAGNOSIS — G4700 Insomnia, unspecified: Secondary | ICD-10-CM | POA: Diagnosis not present

## 2021-02-06 DIAGNOSIS — I10 Essential (primary) hypertension: Secondary | ICD-10-CM | POA: Diagnosis not present

## 2021-02-06 DIAGNOSIS — F320 Major depressive disorder, single episode, mild: Secondary | ICD-10-CM | POA: Diagnosis not present

## 2021-02-06 DIAGNOSIS — M810 Age-related osteoporosis without current pathological fracture: Secondary | ICD-10-CM | POA: Diagnosis not present

## 2021-02-06 DIAGNOSIS — N132 Hydronephrosis with renal and ureteral calculous obstruction: Secondary | ICD-10-CM | POA: Diagnosis not present

## 2021-02-06 DIAGNOSIS — I480 Paroxysmal atrial fibrillation: Secondary | ICD-10-CM | POA: Diagnosis not present

## 2021-02-06 DIAGNOSIS — E7800 Pure hypercholesterolemia, unspecified: Secondary | ICD-10-CM | POA: Diagnosis not present

## 2021-02-06 DIAGNOSIS — F341 Dysthymic disorder: Secondary | ICD-10-CM | POA: Diagnosis not present

## 2021-02-06 DIAGNOSIS — I2510 Atherosclerotic heart disease of native coronary artery without angina pectoris: Secondary | ICD-10-CM | POA: Diagnosis not present

## 2021-02-06 LAB — ACETYLCHOLINE RECEPTOR, BINDING: A CHR BINDING ABS: <0.3 nmol/L

## 2021-02-06 LAB — STRIATED MUSCLE ANTIBODY: STRIATED MUSCLE AB SCREEN: NEGATIVE

## 2021-02-07 DIAGNOSIS — N200 Calculus of kidney: Secondary | ICD-10-CM | POA: Diagnosis not present

## 2021-02-14 ENCOUNTER — Telehealth: Payer: Medicare Other | Admitting: Cardiology

## 2021-02-14 DIAGNOSIS — M62562 Muscle wasting and atrophy, not elsewhere classified, left lower leg: Secondary | ICD-10-CM | POA: Diagnosis not present

## 2021-02-14 DIAGNOSIS — N200 Calculus of kidney: Secondary | ICD-10-CM | POA: Diagnosis not present

## 2021-02-14 DIAGNOSIS — Z5189 Encounter for other specified aftercare: Secondary | ICD-10-CM | POA: Diagnosis not present

## 2021-02-14 DIAGNOSIS — R2689 Other abnormalities of gait and mobility: Secondary | ICD-10-CM | POA: Diagnosis not present

## 2021-02-14 DIAGNOSIS — M810 Age-related osteoporosis without current pathological fracture: Secondary | ICD-10-CM | POA: Diagnosis not present

## 2021-02-14 DIAGNOSIS — J17 Pneumonia in diseases classified elsewhere: Secondary | ICD-10-CM | POA: Diagnosis not present

## 2021-02-14 DIAGNOSIS — M62561 Muscle wasting and atrophy, not elsewhere classified, right lower leg: Secondary | ICD-10-CM | POA: Diagnosis not present

## 2021-02-14 DIAGNOSIS — R278 Other lack of coordination: Secondary | ICD-10-CM | POA: Diagnosis not present

## 2021-02-20 DIAGNOSIS — H5034 Intermittent alternating exotropia: Secondary | ICD-10-CM | POA: Diagnosis not present

## 2021-03-13 DIAGNOSIS — I10 Essential (primary) hypertension: Secondary | ICD-10-CM | POA: Diagnosis not present

## 2021-03-13 DIAGNOSIS — M810 Age-related osteoporosis without current pathological fracture: Secondary | ICD-10-CM | POA: Diagnosis not present

## 2021-03-13 DIAGNOSIS — I480 Paroxysmal atrial fibrillation: Secondary | ICD-10-CM | POA: Diagnosis not present

## 2021-03-13 DIAGNOSIS — E7800 Pure hypercholesterolemia, unspecified: Secondary | ICD-10-CM | POA: Diagnosis not present

## 2021-03-13 DIAGNOSIS — G4700 Insomnia, unspecified: Secondary | ICD-10-CM | POA: Diagnosis not present

## 2021-03-26 ENCOUNTER — Encounter: Payer: Self-pay | Admitting: Cardiology

## 2021-03-26 ENCOUNTER — Telehealth (INDEPENDENT_AMBULATORY_CARE_PROVIDER_SITE_OTHER): Payer: Medicare Other | Admitting: Cardiology

## 2021-03-26 VITALS — BP 144/86 | Ht 59.0 in | Wt 111.0 lb

## 2021-03-26 DIAGNOSIS — I10 Essential (primary) hypertension: Secondary | ICD-10-CM | POA: Diagnosis not present

## 2021-03-26 DIAGNOSIS — I2510 Atherosclerotic heart disease of native coronary artery without angina pectoris: Secondary | ICD-10-CM

## 2021-03-26 DIAGNOSIS — R0989 Other specified symptoms and signs involving the circulatory and respiratory systems: Secondary | ICD-10-CM | POA: Diagnosis not present

## 2021-03-26 DIAGNOSIS — R0609 Other forms of dyspnea: Secondary | ICD-10-CM

## 2021-03-26 DIAGNOSIS — R0600 Dyspnea, unspecified: Secondary | ICD-10-CM

## 2021-03-26 DIAGNOSIS — R600 Localized edema: Secondary | ICD-10-CM

## 2021-03-26 NOTE — Progress Notes (Signed)
Virtual Visit via Telephone Note   This visit type was conducted due to national recommendations for restrictions regarding the COVID-19 Pandemic (e.g. social distancing) in an effort to limit this patient's exposure and mitigate transmission in our community.  Due to her co-morbid illnesses, this patient is at least at moderate risk for complications without adequate follow up.  This format is felt to be most appropriate for this patient at this time.  The patient did not have access to video technology/had technical difficulties with video requiring transitioning to audio format only (telephone).  All issues noted in this document were discussed and addressed.  No physical exam could be performed with this format.  Please refer to the patient's chart for her  consent to telehealth for [facility].   The patient was identified using 2 identifiers.  Patient Location: Home Provider Location: Office/Clinic  Date:  03/26/2021   ID:  [name], DOB July 08, 1941, MRN [id number]  PCP:  Wenda Low, MD  Cardiologist:  Buford Dresser, MD PhD  Referring MD: Wenda Low, MD   CC: follow up  History of Present Illness:    [name] is a 80 y.o. female with a hx of HTN, CAD, HLD, paroxysmal atrial fibrillation (during hospitalization in 2017) who is seen for follow up today. I initially met her 07/19/2019 as a new patient to me/prior Dr. Saunders Revel for the evaluation and management of cardiovascular disease.  Today: Very confusing for her recently; was taking 50 mg losartan BID. Taking 25 mg BID since seeing Dr. Lysle Rubens.  BP mostly 800-349 highest systolic before taking medications, then 170 after taking meds. Rarely sees numbers 179-150 systolic. Shivers at high numbers, feels wiped out with low numbers. Feels best with systolics in the 569V. Hasn't noticed association with pain level. Lowest mid day.   Has swollen ankles and feet the last 2 weeks. Weight up 3 lbs. Shortness of  breath stable, chronic. Not helped much by compression stockings or elevating feet. Has been trying to drink 64 oz of water per day.  Back pain is debilitating, takes occasional NSAIDs, discussed.   Denies chest pain, shortness of breath at rest or with normal exertion. No PND, orthopnea. No syncope or palpitations.   Past Medical History:  Diagnosis Date  . Abdominal pain, unspecified site   . ADVERSE REACTION TO MEDICATION 07/31/2009  . ANEMIA-NOS 07/07/2007  . Arthritis   . Chest pain, unspecified   . Chronic kidney disease    hx of kidney stone  . COLONIC POLYPS, ADENOMATOUS, HX OF   . Complication of anesthesia   . Disturbance of skin sensation   . DIVERTICULOSIS, COLON 02/23/2009  . Family history of diabetes mellitus   . Family history of malignant neoplasm of breast   . FATIGUE 05/25/2009  . Fever, unspecified   . Heart murmur    hx of  . HYPERLIPIDEMIA 07/07/2007  . HYPERPARATHYROIDISM UNSPECIFIED 10/20/2007  . HYPERTENSION 02/04/2008  . Irritable bowel syndrome (IBS)   . NEPHROLITHIASIS, HX OF 11/15/2008  . OSTEOPOROSIS 01/01/2008  . Other acquired absence of organ    parathyroidectomy  . Pain in limb   . PALPITATIONS, OCCASIONAL 07/12/2008  . PARATHYROIDECTOMY 01/02/2009  . PONV (postoperative nausea and vomiting)   . SCOLIOSIS 05/25/2009  . Seasonal allergies   . Unspecified constipation   . URINARY INCONTINENCE 07/07/2007  . UTI'S, RECURRENT 11/20/2009   kimbrough     Past Surgical History:  Procedure Laterality Date  . BUNIONECTOMY    .  CATARACT EXTRACTION, BILATERAL    . COLONOSCOPY    . EXTRACORPOREAL SHOCK WAVE LITHOTRIPSY Left 12/21/2020   Procedure: EXTRACORPOREAL SHOCK WAVE LITHOTRIPSY (ESWL);  Surgeon: Festus Aloe, MD;  Location: [facility];  Service: Urology;  Laterality: Left;  . LAPAROSCOPIC APPENDECTOMY  01/19/2016   Procedure: APPENDECTOMY LAPAROSCOPIC with orifice of cecal polyp;  Surgeon: Leighton Ruff, MD;  Location: [facility];   Service: General;;  . LEFT HEART CATH AND CORONARY ANGIOGRAPHY N/A 04/07/2017   Procedure: Left Heart Cath and Coronary Angiography;  Surgeon: Nelva Bush, MD;  Location: [place] [facility];  Service: Cardiovascular;  Laterality: N/A;  . PARATHYROIDECTOMY     Rt Superior open neck exploration  . POLYPECTOMY    . RIGHT OOPHORECTOMY    . TONSILLECTOMY      Current Medications: Current Outpatient Medications on File Prior to Visit  Medication Sig  . losartan (COZAAR) 100 MG tablet Take 50 mg by mouth 2 (two) times daily.  . polyethylene glycol (MIRALAX / GLYCOLAX) 17 g packet Take 17 g by mouth daily. Give for 3 days and hold for 2 days   No current facility-administered medications on file prior to visit.     Allergies:   Anesthetics, amide and Lisinopril   Social History   Tobacco Use  . Smoking status: Never Smoker  . Smokeless tobacco: Never Used  Vaping Use  . Vaping Use: Never used  Substance Use Topics  . Alcohol use: Yes    Comment: 1 bottle wine week  . Drug use: No    Family History: The patient's family history includes Breast cancer in her paternal aunt; Diabetes in an other family member; Heart disease in her mother; Heart disease (age of onset: 26) in her father; Sudden death (age of onset: 7) in her father. There is no history of Colon cancer or Stomach cancer.  ROS:   Please see the history of present illness.  Additional pertinent ROS otherwise unremarkable.   EKGs/Labs/Other Studies Reviewed:    The following studies were reviewed today: LHC (04/07/17): Minimal CAD involving large epicardial coronary arteries. Mild to moderate stenosis of diagonal branches is evident. Normal LV contraction. Normal LV filling pressure. Small right radial artery.  Pharmacologic myocardial perfusion stress test (03/24/17): High risk study with small in size, mild in severity, basal inferoseptal and mid inferoseptal defects that could reflect diaphragmatic attenuation  and/or ischemia. Is also a small in size, moderate in severity, partially reversible apical defect. Moderate in size, moderate in severity, basal anteroseptal, mid anteroseptal, and apical septal defect is reversible and concerning for ischemia. LVEF 67%.  Holter monitor (02/18/17): Predominantly sinus rhythm with rare PACs and PVCs. No significant arrhythmias.  EKG:  EKG is personally reviewed.  The ekg ordered 11/22/20 demonstrates sinus bradycardia at 56 bpm  Recent Labs: 12/29/2020: Hemoglobin 12.5 01/10/2021: BUN 14; Creatinine 0.7; Potassium 4.7; Sodium 142 01/30/2021: TSH 1.42  Recent Lipid Panel    Component Value Date/Time   CHOL 132 01/25/2015 1112   TRIG 64 01/25/2015 1112   HDL 68 01/25/2015 1112   CHOLHDL 1.9 01/25/2015 1112   VLDL 13 01/25/2015 1112   LDLCALC 51 01/25/2015 1112   LDLDIRECT 156.3 03/28/2011 1038    Physical Exam:    VS:  BP (!) 144/86 (Patient Position: Sitting, Cuff Size: Normal)   Ht _0  (1.499 m)   Wt 111 lb (50.3 kg)   BMI 22.42 kg/m     Wt Readings from Last 3 Encounters:  03/26/21  111 lb (50.3 kg)  01/30/21 108 lb (49 kg)  01/24/21 108 lb (49 kg)    Speaking comfortably on the phone, no audible wheezing In no acute distress Alert and oriented Normal affect Normal speech  ASSESSMENT:    1. Essential hypertension   2. Dyspnea on exertion   3. Bilateral leg edema   4. Nonocclusive coronary atherosclerosis of native coronary artery   5. Labile blood pressure    PLAN:    nonobstructive CAD  -on cath 2018 -continue aspirin, atorvastatin -counseled on red flag warning signs that need immediate medical attention  Lower extremity edema, mild weight gain: new in last 2 weeks. Dyspnea on exertion is chronic. -not improved with compression/elevation -echocardiogram, close follow up  Hypertension: labile -on losartan 25 mg BID, with numbers from 209O-709G systolic (after medications). Feels best with pressures in the 283M systolic -we  discussed triggers for elevated BP at length. She notes that if she relaxes, takes a deep breath, or takes valium, her BP improves. We discussed how the fight or flight response can affect BP. She has had a lot of stress since losing her husband last month.  -she also has intermittent low readings, so there is risk to overtreatment -with LE edema, as above, will evaluate with echo first and then discuss options. May need low dose diuretic, will address at follow up. Need to be cautious with orthostasis if diuretic started  Cardiac risk counseling and prevention recommendations: -recommend heart healthy/Mediterranean diet, with whole grains, fruits, vegetable, fish, lean meats, nuts, and olive oil. Limit salt. -recommend moderate walking, 3-5 times/week for 30-50 minutes each session. Aim for at least 150 minutes.week. Goal should be pace of 3 miles/hours, or walking 1.5 miles in 30 minutes -recommend avoidance of tobacco products. Avoid excess alcohol. -on aspirin 81 mg daily per personal preference  Plan for follow up: 1 month in person  Today, I have spent 21 minutes with the patient with telehealth technology discussing the above problems.  Additional time spent in chart review, documentation, and communication.  Medication Adjustments/Labs and Tests Ordered: Current medicines are reviewed at length with the patient today.  Concerns regarding medicines are outlined above.  Orders Placed This Encounter  Procedures  . ECHOCARDIOGRAM COMPLETE   No orders of the defined types were placed in this encounter.   Patient Instructions   Testing/Procedures:  Your physician has requested that you have an echocardiogram. Echocardiography is a painless test that uses sound waves to create images of your heart. It provides your doctor with information about the size and shape of your heart and how well your heart's chambers and valves are working. This procedure takes approximately one hour. There are  no restrictions for this procedure.[place]   Follow-Up: At [facility], you and your health needs are our priority.  As part of our continuing mission to provide you with exceptional heart care, we have created designated Provider Care Teams.  These Care Teams include your primary Cardiologist (physician) and Advanced Practice Providers (APPs -  Physician Assistants and Nurse Practitioners) who all work together to provide you with the care you need, when you need it.  We recommend signing up for the patient portal called "MyChart".  Sign up information is provided on this After Visit Summary.  MyChart is used to connect with patients for Virtual Visits (Telemedicine).  Patients are able to view lab/test results, encounter notes, upcoming appointments, etc.  Non-urgent messages can be sent to your provider as  well.   To learn more about what you can do with MyChart, go to [email or web address].    Your next appointment:   1 month(s)  The format for your next appointment:   In Person  Provider:   Buford Dresser, MD       Signed, Buford Dresser, MD PhD 03/26/2021 12:38 PM   [place]

## 2021-03-26 NOTE — Patient Instructions (Signed)
  Testing/Procedures:  Your physician has requested that you have an echocardiogram. Echocardiography is a painless test that uses sound waves to create images of your heart. It provides your doctor with information about the size and shape of your heart and how well your heart's chambers and valves are working. This procedure takes approximately one hour. There are no restrictions for this procedure.[place]   Follow-Up: At [facility], you and your health needs are our priority.  As part of our continuing mission to provide you with exceptional heart care, we have created designated Provider Care Teams.  These Care Teams include your primary Cardiologist (physician) and Advanced Practice Providers (APPs -  Physician Assistants and Nurse Practitioners) who all work together to provide you with the care you need, when you need it.  We recommend signing up for the patient portal called "MyChart".  Sign up information is provided on this After Visit Summary.  MyChart is used to connect with patients for Virtual Visits (Telemedicine).  Patients are able to view lab/test results, encounter notes, upcoming appointments, etc.  Non-urgent messages can be sent to your provider as well.   To learn more about what you can do with MyChart, go to [email or web address].    Your next appointment:   1 month(s)  The format for your next appointment:   In Person  Provider:   Buford Dresser, MD

## 2021-03-28 ENCOUNTER — Telehealth: Payer: Self-pay | Admitting: Internal Medicine

## 2021-03-28 NOTE — Telephone Encounter (Signed)
   Are you willing to accept as new patient. Referred by Si Raider [phone]

## 2021-03-28 NOTE — Telephone Encounter (Signed)
Ok Thx 

## 2021-03-29 DIAGNOSIS — N200 Calculus of kidney: Secondary | ICD-10-CM | POA: Diagnosis not present

## 2021-03-29 DIAGNOSIS — N3020 Other chronic cystitis without hematuria: Secondary | ICD-10-CM | POA: Diagnosis not present

## 2021-03-29 DIAGNOSIS — R3915 Urgency of urination: Secondary | ICD-10-CM | POA: Diagnosis not present

## 2021-03-29 DIAGNOSIS — R350 Frequency of micturition: Secondary | ICD-10-CM | POA: Diagnosis not present

## 2021-04-05 ENCOUNTER — Emergency Department (HOSPITAL_BASED_OUTPATIENT_CLINIC_OR_DEPARTMENT_OTHER): Payer: Medicare Other | Admitting: Radiology

## 2021-04-05 ENCOUNTER — Emergency Department (HOSPITAL_BASED_OUTPATIENT_CLINIC_OR_DEPARTMENT_OTHER)
Admission: EM | Admit: 2021-04-05 | Discharge: 2021-04-05 | Disposition: A | Discharge status: Home / Self Care | Payer: Medicare Other | Attending: Emergency Medicine | Admitting: Emergency Medicine

## 2021-04-05 ENCOUNTER — Other Ambulatory Visit: Payer: Self-pay

## 2021-04-05 ENCOUNTER — Encounter (HOSPITAL_BASED_OUTPATIENT_CLINIC_OR_DEPARTMENT_OTHER): Payer: Self-pay | Admitting: Emergency Medicine

## 2021-04-05 DIAGNOSIS — F0281 Dementia in other diseases classified elsewhere with behavioral disturbance: Secondary | ICD-10-CM | POA: Diagnosis not present

## 2021-04-05 DIAGNOSIS — I2510 Atherosclerotic heart disease of native coronary artery without angina pectoris: Secondary | ICD-10-CM | POA: Insufficient documentation

## 2021-04-05 DIAGNOSIS — N189 Chronic kidney disease, unspecified: Secondary | ICD-10-CM | POA: Insufficient documentation

## 2021-04-05 DIAGNOSIS — I10 Essential (primary) hypertension: Secondary | ICD-10-CM | POA: Diagnosis not present

## 2021-04-05 DIAGNOSIS — R0789 Other chest pain: Secondary | ICD-10-CM | POA: Diagnosis not present

## 2021-04-05 DIAGNOSIS — Z79899 Other long term (current) drug therapy: Secondary | ICD-10-CM | POA: Insufficient documentation

## 2021-04-05 DIAGNOSIS — Z8679 Personal history of other diseases of the circulatory system: Secondary | ICD-10-CM | POA: Diagnosis not present

## 2021-04-05 DIAGNOSIS — J90 Pleural effusion, not elsewhere classified: Secondary | ICD-10-CM | POA: Diagnosis not present

## 2021-04-05 DIAGNOSIS — G309 Alzheimer's disease, unspecified: Secondary | ICD-10-CM | POA: Diagnosis not present

## 2021-04-05 DIAGNOSIS — R531 Weakness: Secondary | ICD-10-CM | POA: Diagnosis not present

## 2021-04-05 DIAGNOSIS — R0902 Hypoxemia: Secondary | ICD-10-CM | POA: Diagnosis not present

## 2021-04-05 DIAGNOSIS — I129 Hypertensive chronic kidney disease with stage 1 through stage 4 chronic kidney disease, or unspecified chronic kidney disease: Secondary | ICD-10-CM | POA: Diagnosis not present

## 2021-04-05 DIAGNOSIS — R030 Elevated blood-pressure reading, without diagnosis of hypertension: Secondary | ICD-10-CM | POA: Diagnosis present

## 2021-04-05 LAB — BASIC METABOLIC PANEL
Anion gap: 9 (ref 5–15)
BUN: 19 mg/dL (ref 8–23)
CO2: 27 mmol/L (ref 22–32)
Calcium: 9.3 mg/dL (ref 8.9–10.3)
Chloride: 104 mmol/L (ref 98–111)
Creatinine, Ser: 0.83 mg/dL (ref 0.44–1.00)
GFR, Estimated: >60 mL/min (ref >60)
Glucose, Bld: 92 mg/dL (ref 70–99)
Potassium: 3.8 mmol/L (ref 3.5–5.1)
Sodium: 140 mmol/L (ref 135–145)

## 2021-04-05 LAB — TROPONIN I (HIGH SENSITIVITY): Troponin I (High Sensitivity): 7 ng/L (ref <18)

## 2021-04-05 LAB — CBC
HCT: 38.3 % (ref 36.0–46.0)
Hemoglobin: 12.8 g/dL (ref 12.0–15.0)
MCH: 31.8 pg (ref 26.0–34.0)
MCHC: 33.4 g/dL (ref 30.0–36.0)
MCV: 95.3 fL (ref 80.0–100.0)
Platelets: 195 10*3/uL (ref 150–400)
RBC: 4.02 MIL/uL (ref 3.87–5.11)
RDW: 13.1 % (ref 11.5–15.5)
WBC: 7 10*3/uL (ref 4.0–10.5)
nRBC: 0 % (ref 0.0–0.2)

## 2021-04-05 NOTE — ED Provider Notes (Signed)
[place] EMERGENCY DEPT Provider Note   CSN: [id number] Arrival date & time: 04/05/21  1626     History Chief Complaint  Patient presents with  . Hypertension    [name] is a 80 y.o. female.  HPI Patient presents with high blood pressure.  Feels a little shaky when her blood pressure goes up.  Get seen by cardiology and has recent adjustments of her medications.  States had actually gone down her medication.  Reviewing records it appears that there was talk of adding a diuretic but wanted to get echocardiogram first.  Blood pressures have been running between [id number] systolic.  Went up to 230 today.  No chest pain.  States she does have some fluid on her legs.  No headache.  Does have some back pain.  Reportedly started yesterday when she either bent over or coughed.  She is on losartan 25 mg twice a day.  Has nonocclusive coronary artery disease from previous heart cath.    Past Medical History:  Diagnosis Date  . Abdominal pain, unspecified site   . ADVERSE REACTION TO MEDICATION 07/31/2009  . ANEMIA-NOS 07/07/2007  . Arthritis   . Chest pain, unspecified   . Chronic kidney disease    hx of kidney stone  . COLONIC POLYPS, ADENOMATOUS, HX OF   . Complication of anesthesia   . Disturbance of skin sensation   . DIVERTICULOSIS, COLON 02/23/2009  . Family history of diabetes mellitus   . Family history of malignant neoplasm of breast   . FATIGUE 05/25/2009  . Fever, unspecified   . Heart murmur    hx of  . HYPERLIPIDEMIA 07/07/2007  . HYPERPARATHYROIDISM UNSPECIFIED 10/20/2007  . HYPERTENSION 02/04/2008  . Irritable bowel syndrome (IBS)   . NEPHROLITHIASIS, HX OF 11/15/2008  . OSTEOPOROSIS 01/01/2008  . Other acquired absence of organ    parathyroidectomy  . Pain in limb   . PALPITATIONS, OCCASIONAL 07/12/2008  . PARATHYROIDECTOMY 01/02/2009  . PONV (postoperative nausea and vomiting)   . SCOLIOSIS 05/25/2009  . Seasonal allergies   . Unspecified  constipation   . URINARY INCONTINENCE 07/07/2007  . UTI'S, RECURRENT 11/20/2009   kimbrough     Patient Active Problem List   Diagnosis Date Noted  . Unsteady gait when walking 01/19/2021  . Diplopia 01/19/2021  . Late onset Alzheimer's dementia without behavioral disturbance ([place]) 01/19/2021  . Chronic idiopathic constipation 01/19/2021  . Labile blood pressure 01/19/2021  . Coronary artery disease involving native coronary artery of native heart without angina pectoris 04/28/2017  . Syncope 04/07/2017  . Shortness of breath 04/07/2017  . Abnormal stress test 04/07/2017  . Sinus bradycardia 02/21/2017  . Coronary artery calcification 02/21/2017  . Compression fracture of thoracic vertebra ([place]) 02/08/2017  . Dizziness and giddiness 02/08/2017  . Atrial fibrillation with rapid ventricular response ([place]) 01/21/2016  . Colon polyp s/p appy/partialcecetomy 01/19/3016 01/19/2016  . Orthostatic dizziness 04/25/2014  . Vertigo 12/26/2012  . Hypertension, uncontrolled 09/11/2012  . Diastolic dysfunction [id number]  . Sleep disturbance 09/17/2011  . Hypotension 12/26/2010  . Orthostatic hypotension 12/26/2010  . UTI'S, RECURRENT 11/20/2009  . ADVERSE REACTION TO MEDICATION 07/31/2009  . SCOLIOSIS 05/25/2009  . FATIGUE 05/25/2009  . DIVERTICULOSIS, COLON 02/23/2009  . COLONIC POLYPS, ADENOMATOUS, HX OF 02/23/2009  . PARATHYROIDECTOMY 01/02/2009  . NEPHROLITHIASIS, HX OF 11/15/2008  . PALPITATIONS, OCCASIONAL 07/12/2008  . Essential hypertension 02/04/2008  . Constipation 01/01/2008  . LEG PAIN, LEFT 01/01/2008  . OSTEOPOROSIS 01/01/2008  . HYPERPARATHYROIDISM UNSPECIFIED  10/20/2007  . HYPERLIPIDEMIA 07/07/2007  . ANEMIA-NOS 07/07/2007  . URINARY INCONTINENCE 07/07/2007    Past Surgical History:  Procedure Laterality Date  . BUNIONECTOMY    . CATARACT EXTRACTION, BILATERAL    . COLONOSCOPY    . EXTRACORPOREAL SHOCK WAVE LITHOTRIPSY Left 12/21/2020   Procedure: EXTRACORPOREAL  SHOCK WAVE LITHOTRIPSY (ESWL);  Surgeon: Festus Aloe, MD;  Location: [facility];  Service: Urology;  Laterality: Left;  . LAPAROSCOPIC APPENDECTOMY  01/19/2016   Procedure: APPENDECTOMY LAPAROSCOPIC with orifice of cecal polyp;  Surgeon: Leighton Ruff, MD;  Location: [facility];  Service: General;;  . LEFT HEART CATH AND CORONARY ANGIOGRAPHY N/A 04/07/2017   Procedure: Left Heart Cath and Coronary Angiography;  Surgeon: Nelva Bush, MD;  Location: [place] [facility];  Service: Cardiovascular;  Laterality: N/A;  . PARATHYROIDECTOMY     Rt Superior open neck exploration  . POLYPECTOMY    . RIGHT OOPHORECTOMY    . TONSILLECTOMY       OB History    Gravida  3   Para      Term      Preterm      AB      Living  3     SAB      IAB      Ectopic      Multiple      Live Births              Family History  Problem Relation Age of Onset  . Sudden death Father 110  . Heart disease Father 66       MI  . Heart disease Mother        valve replacement  . Diabetes Other        1st degree relative  . Breast cancer Paternal Aunt   . Colon cancer Neg Hx   . Stomach cancer Neg Hx     Social History   Tobacco Use  . Smoking status: Never Smoker  . Smokeless tobacco: Never Used  Vaping Use  . Vaping Use: Never used  Substance Use Topics  . Alcohol use: Yes    Comment: 1 glass of wine per day  . Drug use: No    Home Medications Prior to Admission medications   Medication Sig Start Date End Date Taking? Authorizing Provider  losartan (COZAAR) 100 MG tablet Take 50 mg by mouth 2 (two) times daily.    [provider]  polyethylene glycol (MIRALAX / GLYCOLAX) 17 g packet Take 17 g by mouth daily. Give for 3 days and hold for 2 days    [provider]    Allergies    Anesthetics, amide and Lisinopril  Review of Systems   Review of Systems  Constitutional: Negative for appetite change and fever.  HENT: Negative for congestion.    Respiratory: Negative for shortness of breath.   Cardiovascular: Positive for leg swelling. Negative for chest pain.  Gastrointestinal: Negative for abdominal pain.  Genitourinary: Negative for flank pain.  Musculoskeletal: Positive for back pain.  Skin: Negative for rash.  Neurological: Negative for weakness.  Psychiatric/Behavioral: Negative for confusion.    Physical Exam Updated Vital Signs BP (!) 208/79 (BP Location: Right Arm)   Pulse 71   Temp 98.3 F (36.8 C) (Oral)   Resp 18   Ht 4\' 11"  (1.499 m)   Wt 51.7 kg   SpO2 96%   BMI 23.03 kg/m   Physical Exam Vitals and nursing note reviewed.  HENT:     Head: Atraumatic.  Cardiovascular:     Rate and Rhythm: Regular rhythm.  Pulmonary:     Comments: Tenderness over right mid back paraspinal area.  No rash.  No deformity.  No midline tenderness. Chest:     Chest wall: Tenderness present.  Abdominal:     Tenderness: There is no abdominal tenderness.  Musculoskeletal:        General: Tenderness present.  Skin:    General: Skin is warm.     Capillary Refill: Capillary refill takes less than 2 seconds.  Neurological:     General: No focal deficit present.     Mental Status: She is alert.     ED Results / Procedures / Treatments   Labs (all labs ordered are listed, but only abnormal results are displayed) Labs Reviewed  BASIC METABOLIC PANEL  CBC  TROPONIN I (HIGH SENSITIVITY)    EKG EKG Interpretation  Date/Time:  Thursday Apr 05 2021 16:32:21 EDT Ventricular Rate:  68 PR Interval:  174 QRS Duration: 95 QT Interval:  429 QTC Calculation: 457 R Axis:   -47 Text Interpretation: Sinus rhythm LAD, consider left anterior fascicular block Confirmed by Davonna Belling [phone]) on 04/05/2021 4:37:23 PM   Radiology DG Ribs Unilateral W/Chest Right  Result Date: 04/05/2021 CLINICAL DATA:  Back pain EXAM: RIGHT RIBS AND CHEST - 3+ VIEW COMPARISON:  12/18/2017, CT 10/15/2016 FINDINGS: Single-view chest  demonstrates trace pleural effusion or thickening. Stable cardiomediastinal silhouette with borderline cardiomegaly and tortuous calcified aorta. No pneumothorax. Right rib series demonstrates age indeterminate deformities at right fourth through sixth anterolateral ribs. IMPRESSION: 1. Trace pleural effusions or thickening.  Borderline cardiomegaly. 2. Age indeterminate minimal rib fractures at right fourth through sixth anterolateral ribs Electronically Signed   By: Donavan Foil M.D.   On: 04/05/2021 17:42    Procedures Procedures   Medications Ordered in ED Medications - No data to display  ED Course  I have reviewed the triage vital signs and the nursing notes.  Pertinent labs & imaging results that were available during my care of the patient were reviewed by me and considered in my medical decision making (see chart for details).    MDM Rules/Calculators/A&P                          Patient presents with high blood pressure.  Recently seen by the hypertension clinic for the same.  Blood pressures have been going with systolics from the 998P to the 100's.  Reportedly up to 200 today.  Potentially even higher.  A little shakiness which is typical for her.  No headache.  Does have some pain in her back.  Began acutely after she bent over or cough.  Tenderness reproducible on the back.  X-ray showed likely old rib fractures since it is a slightly more lateral position than where the tenderness is.  But will treat clinically as this is musculoskeletal chest pain.  Doubt this is cardiac cause.  Doubt other cause such as aortic dissection.  Discussed with patient and she will take the oxycodone she has after her lithotripsy.  This pain is higher I think it is not related to the lithotripsy.  Doubt pulmonary embolism.  Will have patient follow-up with her hypertension clinic for further adjustment of her medications.  I do not see acute endorgan damage at this time.  Discharge home. Final Clinical  Impression(s) / ED Diagnoses Final diagnoses:  Hypertension, unspecified  type  Chest wall pain    Rx / DC Orders ED Discharge Orders    None       Davonna Belling, MD 04/05/21 1943

## 2021-04-05 NOTE — Discharge Instructions (Signed)
Take the oxycodone you have from the lithotripsy to help with the back pain.  Take care with it because it can make you unsteady.  A lidocaine patch may also help.  Follow-up with Dr. Harrell Gave as soon as possible for the elevated blood pressure.

## 2021-04-05 NOTE — ED Triage Notes (Signed)
Patient reports to the ER from EMS Patient reports shakiness and hypertension.  Patient reports she only drinks 73mls a day.  Patient is taking losartan and her BP's have been running 180's/100's  Last BP 230/108 Patient denies neuro changes Denies blurred vision.

## 2021-04-06 ENCOUNTER — Telehealth: Payer: Self-pay | Admitting: Cardiology

## 2021-04-06 DIAGNOSIS — M546 Pain in thoracic spine: Secondary | ICD-10-CM | POA: Diagnosis not present

## 2021-04-06 DIAGNOSIS — M810 Age-related osteoporosis without current pathological fracture: Secondary | ICD-10-CM | POA: Diagnosis not present

## 2021-04-06 NOTE — Telephone Encounter (Signed)
[name], patient's best friend, is calling on behalf of pt. Patient went to the ED yesterday and was told to follow up with Dr. Harrell Gave. Teresa would like the patient to be seen today but states she was told her issue is either broken ribs or shingles. She would like pt to be checked out today and was very adament about it being today. Not sure if pt should come in if she possibly has shingles. Advised Teresa that she is not on the DPR and our nurses may not be able to talk with her, she stated that the pt's sons are out of town and she would be the next point of contact. She stated that she would make it a point to be with the patient when she got a call back. Please advise.

## 2021-04-06 NOTE — Telephone Encounter (Signed)
Called patient, spoke with her and given okay to speak to friend as well. She states that she was at the hospital yesterday. I reviewed notes per hospital stay- they did not feel it was cardiac related, blood work is okay. Patient is scheduled for a ECHO on 05/26- I did recommend that she have this completed. Patient denies chest pain, shortness of breath (she does mention that she can not take a deep breath due to pain in her back), she does mention some swelling in her legs. Patient mentions that she has had lithotripsy that did not take care of her kidney stone. Her back pain is severe. I did mention it could be related to kidney stone- as well as the swelling if her kidneys were not functioning well. Patient advised to contact kidney doctor as well. They mentioned that she could have broken ribs or shingles, and wanted to be seen in the office today. I did advise that those complaints or concerns should be reviewed by PCP. Advised that they contact that office as well. Patient and friend was very upset that they could not see Dr.Christopher today- I did advise that these things did not sound cardiac related, but did give recommendations of when to seek immediate help (sharp chest pain, radiating to neck or arms, 3lb weight gain in a day, shortness of breath) patient and friend both said thank you and hung up. Will route to MD to make aware and I could call with any recommendations.

## 2021-04-09 NOTE — Telephone Encounter (Signed)
Agree with recommendations. I am rounding in the hospital currently and am not in the office. Agree with plans for echo and follow up.

## 2021-04-10 DIAGNOSIS — M810 Age-related osteoporosis without current pathological fracture: Secondary | ICD-10-CM | POA: Diagnosis not present

## 2021-04-10 DIAGNOSIS — S2231XA Fracture of one rib, right side, initial encounter for closed fracture: Secondary | ICD-10-CM | POA: Diagnosis not present

## 2021-04-11 DIAGNOSIS — M810 Age-related osteoporosis without current pathological fracture: Secondary | ICD-10-CM | POA: Diagnosis not present

## 2021-04-12 ENCOUNTER — Ambulatory Visit (HOSPITAL_COMMUNITY): Payer: Medicare Other

## 2021-04-12 DIAGNOSIS — M4854XA Collapsed vertebra, not elsewhere classified, thoracic region, initial encounter for fracture: Secondary | ICD-10-CM | POA: Diagnosis not present

## 2021-04-12 DIAGNOSIS — M810 Age-related osteoporosis without current pathological fracture: Secondary | ICD-10-CM | POA: Diagnosis not present

## 2021-04-12 DIAGNOSIS — R768 Other specified abnormal immunological findings in serum: Secondary | ICD-10-CM | POA: Diagnosis not present

## 2021-04-13 ENCOUNTER — Non-Acute Institutional Stay: Payer: Medicare Other | Admitting: Internal Medicine

## 2021-04-13 ENCOUNTER — Other Ambulatory Visit: Payer: Self-pay

## 2021-04-13 ENCOUNTER — Encounter: Payer: Self-pay | Admitting: Internal Medicine

## 2021-04-13 VITALS — BP 180/100 | HR 60 | Temp 96.3°F | Ht 59.0 in | Wt 111.0 lb

## 2021-04-13 DIAGNOSIS — K5904 Chronic idiopathic constipation: Secondary | ICD-10-CM

## 2021-04-13 DIAGNOSIS — G8929 Other chronic pain: Secondary | ICD-10-CM | POA: Diagnosis not present

## 2021-04-13 DIAGNOSIS — F419 Anxiety disorder, unspecified: Secondary | ICD-10-CM | POA: Diagnosis not present

## 2021-04-13 DIAGNOSIS — R0989 Other specified symptoms and signs involving the circulatory and respiratory systems: Secondary | ICD-10-CM

## 2021-04-13 DIAGNOSIS — M810 Age-related osteoporosis without current pathological fracture: Secondary | ICD-10-CM | POA: Diagnosis not present

## 2021-04-13 DIAGNOSIS — M546 Pain in thoracic spine: Secondary | ICD-10-CM

## 2021-04-13 MED ORDER — CLONAZEPAM 0.25 MG PO TBDP: 0.2500 mg | ORAL_TABLET | Freq: Every day | ORAL | 0 refills | Status: DC | PRN

## 2021-04-13 NOTE — Progress Notes (Signed)
Location: [place] of Service:  Clinic (12)  Provider:   Code Status:  Goals of Care:  Advanced Directives 04/05/2021  Does Patient Have a Medical Advance Directive? Yes  Type of Paramedic of [place];Living will  Does patient want to make changes to medical advance directive? -  Copy of [place] in Chart? -  Would patient like information on creating a medical advance directive? -     Chief Complaint  Patient presents with  . Medical Management of Chronic Issues    Patient here today to establish care.     HPI: Patient is a 80 y.o. female seen today for an acute visit to establish Care and her Labile Hypertension  Patient has h/o  Hypertension Labile Reviwed Notes Her SBP varies from 200 to sometimes less then 100. Has had episode of Syncope in past thought to be due to fluctuating blood pressure.  Has been on hydralazine as needed and was taken off by cardiology.  Also there is a confusion what dose of losartan.  Per patient she was told to take half a pill twice a day.  But her medication list states 50 mg twice a day.  Recently facility nurse checked her blood pressure and it was about 200.  Patient was feeling anxious tremors headache.  Was sent to ED. Patient has appointment with cardiology with plan for echo.  Constipation Continues to be a problem.  Has seen GI before Tremor Has seen Dr. Carles Collet who does not think it is Parkinson.  ? related to anxiety Depression since her husband died 81-month ago Was on Wellbutrin.  Was taken off by her son. 3 sons 2  live in [place] and 1 in [place] Cognitive impairment Patient very highly functional takes care of her ADLs and IADLs.  At some point it  was thought to be due to Valium.  Patient wants to know if she can have something for anxiety. Does not drive anymore  Urinary incontinence Has been on Myrebtriq before by Urology. Stopped due to  Cost H/o Renal Stones Osteoporosis On Prolia by Dr. Milly Jakob  back pain Takes tramadol.  X-rays in the ED showed old thoracic rib  fractures Past Medical History:  Diagnosis Date  . Abdominal pain, unspecified site   . ADVERSE REACTION TO MEDICATION 07/31/2009  . ANEMIA-NOS 07/07/2007  . Arthritis   . Chest pain, unspecified   . Chronic kidney disease    hx of kidney stone  . COLONIC POLYPS, ADENOMATOUS, HX OF   . Complication of anesthesia   . Disturbance of skin sensation   . DIVERTICULOSIS, COLON 02/23/2009  . Family history of diabetes mellitus   . Family history of malignant neoplasm of breast   . FATIGUE 05/25/2009  . Fever, unspecified   . Heart murmur    hx of  . HYPERLIPIDEMIA 07/07/2007  . HYPERPARATHYROIDISM UNSPECIFIED 10/20/2007  . HYPERTENSION 02/04/2008  . Irritable bowel syndrome (IBS)   . NEPHROLITHIASIS, HX OF 11/15/2008  . OSTEOPOROSIS 01/01/2008  . Other acquired absence of organ    parathyroidectomy  . Pain in limb   . PALPITATIONS, OCCASIONAL 07/12/2008  . PARATHYROIDECTOMY 01/02/2009  . PONV (postoperative nausea and vomiting)   . SCOLIOSIS 05/25/2009  . Seasonal allergies   . Unspecified constipation   . URINARY INCONTINENCE 07/07/2007  . UTI'S, RECURRENT 11/20/2009   kimbrough     Past Surgical History:  Procedure Laterality Date  . BUNIONECTOMY    .  CATARACT EXTRACTION, BILATERAL    . COLONOSCOPY    . EXTRACORPOREAL SHOCK WAVE LITHOTRIPSY Left 12/21/2020   Procedure: EXTRACORPOREAL SHOCK WAVE LITHOTRIPSY (ESWL);  Surgeon: Festus Aloe, MD;  Location: [facility];  Service: Urology;  Laterality: Left;  . LAPAROSCOPIC APPENDECTOMY  01/19/2016   Procedure: APPENDECTOMY LAPAROSCOPIC with orifice of cecal polyp;  Surgeon: Leighton Ruff, MD;  Location: [facility];  Service: General;;  . LEFT HEART CATH AND CORONARY ANGIOGRAPHY N/A 04/07/2017   Procedure: Left Heart Cath and Coronary Angiography;  Surgeon: Nelva Bush, MD;  Location: [facility];  Service: Cardiovascular;  Laterality: N/A;  . PARATHYROIDECTOMY     Rt Superior open neck exploration  . POLYPECTOMY    . RIGHT OOPHORECTOMY    . TONSILLECTOMY      Allergies  Allergen Reactions  . Anesthetics, [place] Other (See Comments)    Pt states she had hallucinations and HTN after procedure 01/19/16-she feels may have been reaction to anesthetic  . Lisinopril Cough    Outpatient Encounter Medications as of 04/13/2021  Medication Sig  . acetaminophen (TYLENOL) 500 MG tablet Take 500 mg by mouth every 4 (four) hours as needed.  [name] b complex vitamins capsule Take 1 capsule by mouth daily.  . Cholecalciferol (VITAMIN D) 50 MCG (2000 UT) CAPS Take 1 capsule by mouth daily.  . clonazePAM (KLONOPIN) 0.25 MG disintegrating tablet Take 1 tablet (0.25 mg total) by mouth daily as needed for seizure. (Patient taking differently: Take 0.25 mg by mouth daily as needed. For Anxiety)  . denosumab (PROLIA) 60 MG/ML SOSY injection Inject 60 mg into the skin every 6 (six) months.  [name] losartan (COZAAR) 100 MG tablet Take 50 mg by mouth 2 (two) times daily.  . polyethylene glycol (MIRALAX / GLYCOLAX) 17 g packet Take 17 g by mouth daily. Give for 3 days and hold for 2 days  . senna (SENOKOT) 8.6 MG TABS tablet Take 2 tablets by mouth daily.  . traMADol (ULTRAM) 50 MG tablet Take 50 mg by mouth 2 (two) times daily as needed.  . [DISCONTINUED] ASPIRIN 81 PO Take by mouth daily.  . [DISCONTINUED] atorvastatin (LIPITOR) 20 MG tablet Take 20 mg by mouth daily. 25 mg?  . [DISCONTINUED] Boron 3 MG CAPS Take 1 capsule by mouth daily.  . [DISCONTINUED] buPROPion (WELLBUTRIN XL) 150 MG 24 hr tablet Take 150 mg by mouth daily.  . [DISCONTINUED] CALCIUM IN BONE MINERAL CMPLX PO Take 1 capsule by mouth daily.  . [DISCONTINUED] Cholecalciferol (D3 5000) 125 MCG (5000 UT) capsule Take 5,000 Units by mouth daily.  . [DISCONTINUED] diazepam (VALIUM) 1 MG/ML solution Take 1 mg by mouth. For shakes  . [DISCONTINUED]  diphenhydramine-acetaminophen (TYLENOL PM) 25-500 MG TABS tablet Take 0.5 tablets by mouth at bedtime as needed.  . [DISCONTINUED] Magnesium 400 MG CAPS Take 1 capsule by mouth 2 (two) times daily.  . [DISCONTINUED] ondansetron (ZOFRAN) 4 MG tablet Take 4 mg by mouth every 6 (six) hours as needed for nausea or vomiting.   No facility-administered encounter medications on file as of 04/13/2021.    Review of Systems:  Review of Systems  Constitutional: Negative.   HENT: Negative.   Respiratory: Negative.   Cardiovascular: Negative.   Gastrointestinal: Positive for constipation.  Genitourinary: Positive for frequency and urgency.  Musculoskeletal: Positive for gait problem.  Skin: Negative.   Neurological: Negative for dizziness.  Psychiatric/Behavioral: Positive for confusion and dysphoric mood. The patient is nervous/anxious.     Health  Maintenance  Topic Date Due  . Zoster Vaccines- Shingrix (1 of 2) Never done  . COLONOSCOPY (Pts 45-62yrs Insurance coverage will need to be confirmed)  06/04/2019  . INFLUENZA VACCINE  06/18/2021  . TETANUS/TDAP  01/05/2025  . DEXA SCAN  Completed  . COVID-19 Vaccine  Completed  . PNA vac Low Risk Adult  Completed  . HPV VACCINES  Aged Out    Physical Exam: Vitals:   04/13/21 1009  BP: (!) 158/100  Pulse: 60  Temp: (!) 96.3 F (35.7 C)  SpO2: 99%  Weight: 111 lb (50.3 kg)  Height: 4\' 11"  (1.499 m)   Body mass index is 22.42 kg/m. Physical Exam  Constitutional: Oriented to person, place, and time. Well-developed and well-nourished.  HENT:  Head: Normocephalic.  Mouth/Throat: Oropharynx is clear and moist.  Eyes: Pupils are equal, round, and reactive to light.  Neck: Neck supple.  Cardiovascular: Normal rate and normal heart sounds.  No murmur heard. Pulmonary/Chest: Effort normal and breath sounds normal. No respiratory distress. No wheezes. She has no rales.  Abdominal: Soft. Bowel sounds are normal. No distension. There is no  tenderness. There is no rebound.  Musculoskeletal: No edema.  Lymphadenopathy: none Neurological: Alert and oriented to person, place, and time.  Gait is normal Uses Walker Skin: Skin is warm and dry.  Psychiatric: Normal mood and affect. Behavior is normal. Thought content normal.    Labs reviewed: Basic Metabolic Panel: Recent Labs    12/29/20 0000 01/10/21 0000 01/30/21 1105 04/05/21 1712  NA 142 142  --  140  K 3.6 4.7  --  3.8  CL 102 106  --  104  CO2 27* 24*  --  27  GLUCOSE  --   --   --  92  BUN 8 14  --  19  CREATININE 0.6 0.7  --  0.83  CALCIUM 8.7 8.9  --  9.3  TSH  --   --  1.42  --    Liver Function Tests: No results for input(s): AST, ALT, ALKPHOS, BILITOT, PROT, ALBUMIN in the last 8760 hours. No results for input(s): LIPASE, AMYLASE in the last 8760 hours. No results for input(s): AMMONIA in the last 8760 hours. CBC: Recent Labs    12/29/20 0000 04/05/21 1712  WBC 5.5 7.0  HGB 12.5 12.8  HCT 36 38.3  MCV  --  95.3  PLT  --  195   Lipid Panel: No results for input(s): CHOL, HDL, LDLCALC, TRIG, CHOLHDL, LDLDIRECT in the last 8760 hours. No results found for: HGBA1C  Procedures since last visit: DG Ribs Unilateral W/Chest Right  Result Date: 04/05/2021 CLINICAL DATA:  Back pain EXAM: RIGHT RIBS AND CHEST - 3+ VIEW COMPARISON:  12/18/2017, CT 10/15/2016 FINDINGS: Single-view chest demonstrates trace pleural effusion or thickening. Stable cardiomediastinal silhouette with borderline cardiomegaly and tortuous calcified aorta. No pneumothorax. Right rib series demonstrates age indeterminate deformities at right fourth through sixth anterolateral ribs. IMPRESSION: 1. Trace pleural effusions or thickening.  Borderline cardiomegaly. 2. Age indeterminate minimal rib fractures at right fourth through sixth anterolateral ribs Electronically Signed   By: Donavan Foil M.D.   On: 04/05/2021 17:42    Assessment/Plan 1. Labile blood pressure Will continue on  Cozaar 50 mg BID. Some confusion with 25 /50 Will let Cardiology know Also talked about having Hydralazine PRN  of Very High SBP Also d/w the Facility Nurse to check her BP standing up if it is too high Will wait for Cardiology  Input Cardiology Planning ECHO next week  2. Anxiety Was on Valium before Taken off due to Confusion Will try Klonopin 0.25 mg QD prn for Anxiety especially when her BP gets high and she starts to shake and get nervous Patient is not on any antidepressant Was on Wellbutrin but her son told her not to take it   3. Chronic idiopathic constipation Discussed about Consistent take Senokot and Miralax  4. Age-related osteoporosis without current pathological fracture On Prolia now through Dr Trudie Reed  5. Chronic bilateral thoracic back pain Tylenol and Tramadol at night Talked about Neurontin but will wait to add new med  6 Cognitive Impairment ? Dementia diagnosis Seems to be highly Functional right now MMSe Next visit  7 Tremors Per Dr Arturo Morton note not Parkinson disease ? Anxiety     Labs/tests ordered:  * No order type specified * Next appt:  Visit date not found

## 2021-04-13 NOTE — Patient Instructions (Addendum)
Take Cozaar 50 mg BID Take Klonopin 0.25 mg QD prn for Anxiety. Will see you in 6 weeks. Will call you for Appointment

## 2021-04-15 ENCOUNTER — Encounter: Payer: Self-pay | Admitting: Internal Medicine

## 2021-04-17 ENCOUNTER — Ambulatory Visit (HOSPITAL_COMMUNITY): Payer: Medicare Other | Attending: Internal Medicine

## 2021-04-17 ENCOUNTER — Other Ambulatory Visit: Payer: Self-pay

## 2021-04-17 DIAGNOSIS — R600 Localized edema: Secondary | ICD-10-CM | POA: Insufficient documentation

## 2021-04-17 DIAGNOSIS — R0600 Dyspnea, unspecified: Secondary | ICD-10-CM | POA: Insufficient documentation

## 2021-04-17 DIAGNOSIS — I10 Essential (primary) hypertension: Secondary | ICD-10-CM | POA: Diagnosis not present

## 2021-04-17 DIAGNOSIS — R0609 Other forms of dyspnea: Secondary | ICD-10-CM

## 2021-04-17 LAB — ECHOCARDIOGRAM COMPLETE
Area-P 1/2: 3.03 cm2
P 1/2 time: 517 msec
S' Lateral: 1.9 cm

## 2021-04-24 ENCOUNTER — Ambulatory Visit (INDEPENDENT_AMBULATORY_CARE_PROVIDER_SITE_OTHER): Payer: Medicare Other | Admitting: Internal Medicine

## 2021-04-24 ENCOUNTER — Other Ambulatory Visit: Payer: Self-pay

## 2021-04-24 ENCOUNTER — Encounter: Payer: Self-pay | Admitting: Internal Medicine

## 2021-04-24 DIAGNOSIS — M810 Age-related osteoporosis without current pathological fracture: Secondary | ICD-10-CM

## 2021-04-24 DIAGNOSIS — M546 Pain in thoracic spine: Secondary | ICD-10-CM | POA: Diagnosis not present

## 2021-04-24 DIAGNOSIS — G301 Alzheimer's disease with late onset: Secondary | ICD-10-CM

## 2021-04-24 DIAGNOSIS — I10 Essential (primary) hypertension: Secondary | ICD-10-CM

## 2021-04-24 DIAGNOSIS — I2510 Atherosclerotic heart disease of native coronary artery without angina pectoris: Secondary | ICD-10-CM | POA: Diagnosis not present

## 2021-04-24 DIAGNOSIS — S22000S Wedge compression fracture of unspecified thoracic vertebra, sequela: Secondary | ICD-10-CM

## 2021-04-24 DIAGNOSIS — G8929 Other chronic pain: Secondary | ICD-10-CM

## 2021-04-24 DIAGNOSIS — F0280 Dementia in other diseases classified elsewhere without behavioral disturbance: Secondary | ICD-10-CM | POA: Diagnosis not present

## 2021-04-24 MED ORDER — TRAMADOL HCL 50 MG PO TABS: 25.0000 mg | ORAL_TABLET | Freq: Two times a day (BID) | ORAL | 1 refills | Status: DC | PRN

## 2021-04-24 NOTE — Progress Notes (Signed)
Subjective:  Patient ID: [name], female    DOB: 12-Mar-1941  Age: 80 y.o. MRN: [id number]  CC: New Patient (Initial Visit)   HPI [name] presents for a new pt visit  C/o osteoporosis - Dr Lenna Gilford - on Prolia x 6 years  C/o pain in the thoracic spine x 2 weeks. She has had it before, worse w/standing Pain is 5/10 , pain is 10/10 with standing C/o CAD -patient is seeing Dr Harrell Gave  C/o anxiety  C/o shaking -the patient saw Dr Tat for consultation  MMS - 83 recently  Grieving - her husband died 2 months ago  F/u HTN   Outpatient Medications Prior to Visit  Medication Sig Dispense Refill   acetaminophen (TYLENOL) 500 MG tablet Take 500 mg by mouth every 4 (four) hours as needed.     b complex vitamins capsule Take 1 capsule by mouth daily.     Cholecalciferol (VITAMIN D) 50 MCG (2000 UT) CAPS Take 1 capsule by mouth daily.     denosumab (PROLIA) 60 MG/ML SOSY injection Inject 60 mg into the skin every 6 (six) months.     losartan (COZAAR) 50 MG tablet Take 25 mg by mouth 2 (two) times daily.     magnesium oxide (MAG-OX) 400 MG tablet Take 400 mg by mouth daily.     polyethylene glycol (MIRALAX / GLYCOLAX) 17 g packet Take 17 g by mouth daily. Give for 3 days and hold for 2 days     senna (SENOKOT) 8.6 MG TABS tablet Take 2 tablets by mouth daily.     traMADol (ULTRAM) 50 MG tablet Take 50 mg by mouth 2 (two) times daily as needed.     clonazePAM (KLONOPIN) 0.25 MG disintegrating tablet Take 1 tablet (0.25 mg total) by mouth daily as needed for seizure. (Patient not taking: Reported on 04/24/2021) 15 tablet 0   losartan (COZAAR) 100 MG tablet Take 50 mg by mouth 2 (two) times daily. (Patient not taking: Reported on 04/24/2021)     No facility-administered medications prior to visit.    ROS: Review of Systems  Constitutional: Negative for activity change, appetite change, chills, fatigue and unexpected weight change.  HENT: Negative for congestion, mouth sores  and sinus pressure.   Eyes: Negative for visual disturbance.  Respiratory: Negative for cough and chest tightness.   Gastrointestinal: Negative for abdominal pain and nausea.  Genitourinary: Negative for difficulty urinating, frequency and vaginal pain.  Musculoskeletal: Negative for back pain and gait problem.  Skin: Negative for pallor and rash.  Neurological: Negative for dizziness, tremors, weakness, numbness and headaches.  Psychiatric/Behavioral: Negative for confusion, sleep disturbance and suicidal ideas.    Objective:  BP 120/84 (BP Location: Left Arm)   Pulse (!) 58   Temp 98.7 F (37.1 C) (Oral)   Ht 4\' 11"  (1.499 m)   Wt 107 lb 3.2 oz (48.6 kg)   SpO2 99%   BMI 21.65 kg/m   BP Readings from Last 3 Encounters:  04/24/21 120/84  04/13/21 (!) 180/100  04/05/21 (!) 208/79    Wt Readings from Last 3 Encounters:  04/24/21 107 lb 3.2 oz (48.6 kg)  04/13/21 111 lb (50.3 kg)  04/05/21 114 lb (51.7 kg)    Physical Exam Constitutional:      General: She is not in acute distress.    Appearance: She is well-developed.  HENT:     Head: Normocephalic.     Right Ear: External ear normal.  Left Ear: External ear normal.     Nose: Nose normal.  Eyes:     General:        Right eye: No discharge.        Left eye: No discharge.     Conjunctiva/sclera: Conjunctivae normal.     Pupils: Pupils are equal, round, and reactive to light.  Neck:     Thyroid: No thyromegaly.     Vascular: No JVD.     Trachea: No tracheal deviation.  Cardiovascular:     Rate and Rhythm: Normal rate and regular rhythm.     Heart sounds: Normal heart sounds.  Pulmonary:     Effort: No respiratory distress.     Breath sounds: No stridor. No wheezing.  Abdominal:     General: Bowel sounds are normal. There is no distension.     Palpations: Abdomen is soft. There is no mass.     Tenderness: There is no abdominal tenderness. There is no guarding or rebound.  Musculoskeletal:        General:  No tenderness.     Cervical back: Normal range of motion and neck supple.  Lymphadenopathy:     Cervical: No cervical adenopathy.  Skin:    Findings: No erythema or rash.  Neurological:     Mental Status: She is oriented to person, place, and time.     Cranial Nerves: No cranial nerve deficit.     Motor: No abnormal muscle tone.     Coordination: Coordination normal.     Deep Tendon Reflexes: Reflexes normal.  Psychiatric:        Behavior: Behavior normal.        Thought Content: Thought content normal.        Judgment: Judgment normal.    Using a walker Thoracic spine is tender Slightly ataxic Antalgic gait  Previous medical records/tests were reviewed with the patient Lab Results  Component Value Date   WBC 7.0 04/05/2021   HGB 12.8 04/05/2021   HCT 38.3 04/05/2021   PLT 195 04/05/2021   GLUCOSE 92 04/05/2021   CHOL 132 01/25/2015   TRIG 64 01/25/2015   HDL 68 01/25/2015   LDLDIRECT 156.3 03/28/2011   LDLCALC 51 01/25/2015   ALT 18 12/18/2017   AST 29 12/18/2017   NA 140 04/05/2021   K 3.8 04/05/2021   CL 104 04/05/2021   CREATININE 0.83 04/05/2021   BUN 19 04/05/2021   CO2 27 04/05/2021   TSH 1.42 01/30/2021   INR 1.0 04/01/2017    DG Ribs Unilateral W/Chest Right  Result Date: 04/05/2021 CLINICAL DATA:  Back pain EXAM: RIGHT RIBS AND CHEST - 3+ VIEW COMPARISON:  12/18/2017, CT 10/15/2016 FINDINGS: Single-view chest demonstrates trace pleural effusion or thickening. Stable cardiomediastinal silhouette with borderline cardiomegaly and tortuous calcified aorta. No pneumothorax. Right rib series demonstrates age indeterminate deformities at right fourth through sixth anterolateral ribs. IMPRESSION: 1. Trace pleural effusions or thickening.  Borderline cardiomegaly. 2. Age indeterminate minimal rib fractures at right fourth through sixth anterolateral ribs Electronically Signed   By: Donavan Foil M.D.   On: 04/05/2021 17:42    Assessment & Plan:    Walker Kehr, MD

## 2021-04-24 NOTE — Patient Instructions (Addendum)
Tramadol 50 mg 1/4 or 1/3 tablet 3-4 times a day  Rice sock  Spiky ball or tennis ball  Chair fit 1

## 2021-04-24 NOTE — Assessment & Plan Note (Addendum)
Severe pain.  Start tramadol 50 mg 1/4 or 1/3 tablet 3-4 times a day for pain control. Offered a trigger point inj in paraspinal muscles Consider obtaining a thoracic MRI if symptoms are not better  Potential benefits of a long term opioids use as well as potential risks (i.e. addiction risk, apnea etc) and complications (i.e. Somnolence, constipation and others) were explained to the patient and were aknowledged.

## 2021-04-26 ENCOUNTER — Telehealth: Payer: Self-pay | Admitting: Cardiology

## 2021-04-26 NOTE — Telephone Encounter (Signed)
Patient returning call to discuss Echo results. Please call back

## 2021-04-26 NOTE — Telephone Encounter (Signed)
Pt is calling in regards to obtaining her Echo results from 04/17/21 Please advise

## 2021-04-26 NOTE — Telephone Encounter (Signed)
Attempted to call patient, left message for patient to call back to office.   

## 2021-04-26 NOTE — Telephone Encounter (Signed)
Contacted pt and provided ECHO results. Pt verbalized understanding but report she is still having trouble with managing her BP. She state every morning systolic averages close to 200. Once she take her medication, it will decrease to 170's. Pt report the lowest she's seen was 130/80 at Dr. Alain Marion two days ago.   Pt report she is taking losartan 25 mg BID and questioning whether she is taking the correct dose.  Will forward to MD for clarification and recommendations.

## 2021-04-27 ENCOUNTER — Ambulatory Visit: Payer: Medicare Other | Admitting: Cardiology

## 2021-04-27 DIAGNOSIS — M85852 Other specified disorders of bone density and structure, left thigh: Secondary | ICD-10-CM | POA: Diagnosis not present

## 2021-04-27 DIAGNOSIS — Z780 Asymptomatic menopausal state: Secondary | ICD-10-CM | POA: Diagnosis not present

## 2021-04-27 DIAGNOSIS — M810 Age-related osteoporosis without current pathological fracture: Secondary | ICD-10-CM | POA: Diagnosis not present

## 2021-04-27 LAB — HM DEXA SCAN

## 2021-05-01 NOTE — Telephone Encounter (Signed)
Pt updated and verbalized understanding.  

## 2021-05-01 NOTE — Telephone Encounter (Signed)
I would continue her current regimen, and we will review her pattern at upcoming followup. Overall it is most helpful to have blood pressure measurements an hour or more after taking the medication, as this will help [place] know how well the medicine is working.

## 2021-05-04 ENCOUNTER — Encounter: Payer: Self-pay | Admitting: Internal Medicine

## 2021-05-07 NOTE — Assessment & Plan Note (Signed)
Continue with Prolia.  Continue with calcium and vitamin D.  Start a chair yoga class at [facility]

## 2021-05-07 NOTE — Assessment & Plan Note (Signed)
Mild symptoms so far.  We will continue to monitor

## 2021-05-07 NOTE — Assessment & Plan Note (Signed)
The patient is seeing Dr. Harrell Gave - no angina

## 2021-05-07 NOTE — Assessment & Plan Note (Signed)
Continue with losartan daily

## 2021-05-07 NOTE — Assessment & Plan Note (Signed)
We will continue to monitor current symptoms of thoracic back pain.  May need to obtain MRI if not better.

## 2021-05-09 ENCOUNTER — Ambulatory Visit (INDEPENDENT_AMBULATORY_CARE_PROVIDER_SITE_OTHER): Payer: Medicare Other | Admitting: Internal Medicine

## 2021-05-09 ENCOUNTER — Ambulatory Visit (INDEPENDENT_AMBULATORY_CARE_PROVIDER_SITE_OTHER): Payer: Medicare Other

## 2021-05-09 ENCOUNTER — Encounter: Payer: Self-pay | Admitting: Internal Medicine

## 2021-05-09 ENCOUNTER — Other Ambulatory Visit: Payer: Self-pay

## 2021-05-09 VITALS — BP 130/72 | HR 67 | Temp 98.5°F | Ht 59.0 in | Wt 106.2 lb

## 2021-05-09 DIAGNOSIS — I2510 Atherosclerotic heart disease of native coronary artery without angina pectoris: Secondary | ICD-10-CM | POA: Diagnosis not present

## 2021-05-09 DIAGNOSIS — S22000A Wedge compression fracture of unspecified thoracic vertebra, initial encounter for closed fracture: Secondary | ICD-10-CM | POA: Diagnosis not present

## 2021-05-09 DIAGNOSIS — M546 Pain in thoracic spine: Secondary | ICD-10-CM

## 2021-05-09 DIAGNOSIS — G8929 Other chronic pain: Secondary | ICD-10-CM | POA: Diagnosis not present

## 2021-05-09 DIAGNOSIS — M810 Age-related osteoporosis without current pathological fracture: Secondary | ICD-10-CM

## 2021-05-09 MED ORDER — LIDOCAINE-EPINEPHRINE 2 %-1:100000 IJ SOLN
3.0000 mL | Freq: Once | INTRAMUSCULAR | Status: AC
  Administered 2021-05-09: 3 mL

## 2021-05-09 MED ORDER — TIZANIDINE HCL 2 MG PO TABS: 2.0000 mg | ORAL_TABLET | Freq: Three times a day (TID) | ORAL | 0 refills | Status: DC | PRN

## 2021-05-09 MED ORDER — METHYLPREDNISOLONE ACETATE 40 MG/ML IJ SUSP
40.0000 mg | Freq: Once | INTRAMUSCULAR | Status: AC
  Administered 2021-05-09: 40 mg via INTRAMUSCULAR

## 2021-05-09 NOTE — Progress Notes (Signed)
Subjective:  Patient ID: [name], female    DOB: 01-16-1941  Age: 80 y.o. MRN: [id number]  CC: Follow-up (2 week f/u)   HPI [name] presents for R thor back pain - severe.  Tramadol helps but the pain remains.  It is located more on the right side.  It is worse with sitting or standing. C/o constipation  Outpatient Medications Prior to Visit  Medication Sig Dispense Refill   acetaminophen (TYLENOL) 500 MG tablet Take 500 mg by mouth every 4 (four) hours as needed.     b complex vitamins capsule Take 1 capsule by mouth daily.     Cholecalciferol (VITAMIN D) 50 MCG (2000 UT) CAPS Take 1 capsule by mouth daily.     denosumab (PROLIA) 60 MG/ML SOSY injection Inject 60 mg into the skin every 6 (six) months.     losartan (COZAAR) 50 MG tablet Take 25 mg by mouth 2 (two) times daily.     magnesium oxide (MAG-OX) 400 MG tablet Take 400 mg by mouth daily.     polyethylene glycol (MIRALAX / GLYCOLAX) 17 g packet Take 17 g by mouth daily. Give for 3 days and hold for 2 days     senna (SENOKOT) 8.6 MG TABS tablet Take 2 tablets by mouth daily.     traMADol (ULTRAM) 50 MG tablet Take 0.5-1 tablets (25-50 mg total) by mouth 2 (two) times daily as needed for severe pain. 60 tablet 1   clonazePAM (KLONOPIN) 0.25 MG disintegrating tablet Take 1 tablet (0.25 mg total) by mouth daily as needed for seizure. (Patient not taking: No sig reported) 15 tablet 0   No facility-administered medications prior to visit.    ROS: Review of Systems  Constitutional:  Positive for fatigue. Negative for activity change, appetite change, chills and unexpected weight change.  HENT:  Negative for congestion, mouth sores and sinus pressure.   Eyes:  Negative for visual disturbance.  Respiratory:  Negative for cough and chest tightness.   Gastrointestinal:  Negative for abdominal pain and nausea.  Genitourinary:  Negative for difficulty urinating, frequency and vaginal pain.  Musculoskeletal:  Positive for  back pain. Negative for gait problem.  Skin:  Negative for pallor and rash.  Neurological:  Negative for dizziness, tremors, weakness, numbness and headaches.  Psychiatric/Behavioral:  Negative for confusion and sleep disturbance.    Objective:  BP 130/72 (BP Location: Left Arm)   Pulse 67   Temp 98.5 F (36.9 C) (Oral)   Ht 4\' 11"  (1.499 m)   Wt 106 lb 3.2 oz (48.2 kg)   SpO2 94%   BMI 21.45 kg/m   BP Readings from Last 3 Encounters:  05/09/21 130/72  04/24/21 120/84  04/13/21 (!) 180/100    Wt Readings from Last 3 Encounters:  05/09/21 106 lb 3.2 oz (48.2 kg)  04/24/21 107 lb 3.2 oz (48.6 kg)  04/13/21 111 lb (50.3 kg)    Physical Exam Constitutional:      General: She is not in acute distress.    Appearance: She is well-developed.  HENT:     Head: Normocephalic.     Right Ear: External ear normal.     Left Ear: External ear normal.     Nose: Nose normal.  Eyes:     General:        Right eye: No discharge.        Left eye: No discharge.     Conjunctiva/sclera: Conjunctivae normal.     Pupils:  Pupils are equal, round, and reactive to light.  Neck:     Thyroid: No thyromegaly.     Vascular: No JVD.     Trachea: No tracheal deviation.  Cardiovascular:     Rate and Rhythm: Normal rate and regular rhythm.     Heart sounds: Normal heart sounds.  Pulmonary:     Effort: No respiratory distress.     Breath sounds: No stridor. No wheezing.  Abdominal:     General: Bowel sounds are normal. There is no distension.     Palpations: Abdomen is soft. There is no mass.     Tenderness: There is no abdominal tenderness. There is no guarding or rebound.  Musculoskeletal:        General: Tenderness present.     Cervical back: Normal range of motion and neck supple. No rigidity.  Lymphadenopathy:     Cervical: No cervical adenopathy.  Skin:    Findings: No erythema or rash.  Neurological:     Mental Status: She is oriented to person, place, and time.     Cranial Nerves:  No cranial nerve deficit.     Motor: No abnormal muscle tone.     Coordination: Coordination normal.     Gait: Gait abnormal.     Deep Tendon Reflexes: Reflexes normal.  Psychiatric:        Behavior: Behavior normal.        Thought Content: Thought content normal.        Judgment: Judgment normal.  Thoracic spine is painful in the area of the rhomboid muscles    Procedure Note :    Trigger Point Injection:   Indication : Focal tender area identifiable by the location without other identifiable neurologic or musculoskeletal finding or pathology.   Risks including unsuccessful procedure , bleeding, infection, bruising, skin atrophy and others were explained to the patient in detail as well as the benefits. Informed consent was obtained and signed.   Tthe patient was placed in a comfortable position.  4  points of maximum tenderness over paraspinal and trapezius muscles were marked and  the skin was prepped with Betadine and alcohol. 1 inch 25-gauge needle was used. The needle was advanced perpendicular to the skin. Each trigger point was injected with 1 mL of 2% lidocaine and 10 mg of Depo-Medrol in a usual fashion.  Band-Aids applied.   Tolerated well. Complications: None. Good pain relief following the procedure.    Lab Results  Component Value Date   WBC 7.0 04/05/2021   HGB 12.8 04/05/2021   HCT 38.3 04/05/2021   PLT 195 04/05/2021   GLUCOSE 92 04/05/2021   CHOL 132 01/25/2015   TRIG 64 01/25/2015   HDL 68 01/25/2015   LDLDIRECT 156.3 03/28/2011   LDLCALC 51 01/25/2015   ALT 18 12/18/2017   AST 29 12/18/2017   NA 140 04/05/2021   K 3.8 04/05/2021   CL 104 04/05/2021   CREATININE 0.83 04/05/2021   BUN 19 04/05/2021   CO2 27 04/05/2021   TSH 1.42 01/30/2021   INR 1.0 04/01/2017    DG Ribs Unilateral W/Chest Right  Result Date: 04/05/2021 CLINICAL DATA:  Back pain EXAM: RIGHT RIBS AND CHEST - 3+ VIEW COMPARISON:  12/18/2017, CT 10/15/2016 FINDINGS: Single-view chest  demonstrates trace pleural effusion or thickening. Stable cardiomediastinal silhouette with borderline cardiomegaly and tortuous calcified aorta. No pneumothorax. Right rib series demonstrates age indeterminate deformities at right fourth through sixth anterolateral ribs. IMPRESSION: 1. Trace pleural effusions or thickening.  Borderline cardiomegaly. 2. Age indeterminate minimal rib fractures at right fourth through sixth anterolateral ribs Electronically Signed   By: Donavan Foil M.D.   On: 04/05/2021 17:42    Assessment & Plan:    Walker Kehr, MD

## 2021-05-09 NOTE — Assessment & Plan Note (Addendum)
Not much better.  Tramadol helps, however.   Will get an X ray to rule out new compression fractures Trigger point inj -hope it will help Salonspas patch Trigger pont inj Use tramadol more regular

## 2021-05-09 NOTE — Patient Instructions (Signed)
Salonspas patch

## 2021-05-09 NOTE — Progress Notes (Signed)
Duplicate already received results for bone density 04/27/21

## 2021-05-10 ENCOUNTER — Ambulatory Visit (INDEPENDENT_AMBULATORY_CARE_PROVIDER_SITE_OTHER): Payer: Medicare Other | Admitting: Cardiology

## 2021-05-10 ENCOUNTER — Encounter: Payer: Self-pay | Admitting: Cardiology

## 2021-05-10 VITALS — BP 130/80 | HR 61 | Resp 18 | Ht 59.0 in | Wt 106.0 lb

## 2021-05-10 DIAGNOSIS — Z7189 Other specified counseling: Secondary | ICD-10-CM | POA: Diagnosis not present

## 2021-05-10 DIAGNOSIS — R0989 Other specified symptoms and signs involving the circulatory and respiratory systems: Secondary | ICD-10-CM

## 2021-05-10 DIAGNOSIS — I2510 Atherosclerotic heart disease of native coronary artery without angina pectoris: Secondary | ICD-10-CM | POA: Diagnosis not present

## 2021-05-10 DIAGNOSIS — I10 Essential (primary) hypertension: Secondary | ICD-10-CM

## 2021-05-10 MED ORDER — AMLODIPINE BESYLATE 2.5 MG PO TABS
2.5000 mg | ORAL_TABLET | Freq: Every day | ORAL | 3 refills | Status: DC
Start: 2021-05-10 — End: 2022-05-15

## 2021-05-10 NOTE — Patient Instructions (Signed)
Medication Instructions:  Restart Amlodipine 2.5 mg daily  *If you need a refill on your cardiac medications before your next appointment, please call your pharmacy*   Lab Work: None ordered today   Testing/Procedures: None ordered today   Follow-Up: At [facility], you and your health needs are our priority.  As part of our continuing mission to provide you with exceptional heart care, we have created designated Provider Care Teams.  These Care Teams include your primary Cardiologist (physician) and Advanced Practice Providers (APPs -  Physician Assistants and Nurse Practitioners) who all work together to provide you with the care you need, when you need it.  We recommend signing up for the patient portal called "MyChart".  Sign up information is provided on this After Visit Summary.  MyChart is used to connect with patients for Virtual Visits (Telemedicine).  Patients are able to view lab/test results, encounter notes, upcoming appointments, etc.  Non-urgent messages can be sent to your provider as well.   To learn more about what you can do with MyChart, go to [email or web address].    Your next appointment:   3 month(s) @ [address] [place], [place] [address]   The format for your next appointment:   In Person  Provider:   Buford Dresser, MD

## 2021-05-10 NOTE — Progress Notes (Signed)
Cardiology Office Note:    Date:  05/10/2021   ID:  [name], DOB 08-Mar-1941, MRN [id number]  PCP:  Cassandria Anger, MD  Cardiologist:  Buford Dresser, MD PhD  Referring MD: Wenda Low, MD   CC: follow up  History of Present Illness:    [name] is a 80 y.o. female with a hx of HTN, CAD, HLD, paroxysmal atrial fibrillation (during hospitalization in 2017) who is seen for follow up today. I initially met her 07/19/2019 as a new patient to me/prior Dr. Saunders Revel for the evaluation and management of cardiovascular disease.  Today: Has had labile blood pressures. Feels best when systolic around 086-761. Has changed to Dr. Alain Marion, saw yesterday.  7:30-8 AM: wakes up, generally feels good but not her best time of the day. Takes blood pressure right away. 9 AM: takes 1/2 pill of losartan (25 mg). Checks BP 1 hour later, BP drops about 20 points. Feels poorly if BP drops to 950D systolic. Feels the best in the afternoon Goes to bed at 10 PM, takes 1/2 pill (25 mg) losartan. BP is variable, from 326-712 systolic in the evening.  Does not notice relationship between pain and blood pressure. Feels like she has low BP only a few times a week.   No more LE edema.   Resting HR around 60 bpm, limits agents that have bradycardic potential.   Reviewed prior BP medications: amlodipine 2.5 mg was stopped due to intermittent hypotension, but has not been on this in some time. Discussed thiazides and spironolactone today. Has a history of kidney stones earlier this year. Has issues with incontinence and constipation.   She doesn't go outside much, doesn't walk around much.    Past Medical History:  Diagnosis Date   Abdominal pain, unspecified site    ADVERSE REACTION TO MEDICATION 07/31/2009   ANEMIA-NOS 07/07/2007   Arthritis    Chest pain, unspecified    Chronic kidney disease    hx of kidney stone   COLONIC POLYPS, ADENOMATOUS, HX OF    Complication of anesthesia     Disturbance of skin sensation    DIVERTICULOSIS, COLON 02/23/2009   Family history of diabetes mellitus    Family history of malignant neoplasm of breast    FATIGUE 05/25/2009   Fever, unspecified    Heart murmur    hx of   HYPERLIPIDEMIA 07/07/2007   HYPERPARATHYROIDISM UNSPECIFIED 10/20/2007   HYPERTENSION 02/04/2008   Irritable bowel syndrome (IBS)    NEPHROLITHIASIS, HX OF 11/15/2008   OSTEOPOROSIS 01/01/2008   Other acquired absence of organ    parathyroidectomy   Pain in limb    PALPITATIONS, OCCASIONAL 07/12/2008   PARATHYROIDECTOMY 01/02/2009   PONV (postoperative nausea and vomiting)    SCOLIOSIS 05/25/2009   Seasonal allergies    Unspecified constipation    URINARY INCONTINENCE 07/07/2007   UTI'S, RECURRENT 11/20/2009   kimbrough     Past Surgical History:  Procedure Laterality Date   BUNIONECTOMY     CATARACT EXTRACTION, BILATERAL     COLONOSCOPY     EXTRACORPOREAL SHOCK WAVE LITHOTRIPSY Left 12/21/2020   Procedure: EXTRACORPOREAL SHOCK WAVE LITHOTRIPSY (ESWL);  Surgeon: Festus Aloe, MD;  Location: [facility];  Service: Urology;  Laterality: Left;   LAPAROSCOPIC APPENDECTOMY  01/19/2016   Procedure: APPENDECTOMY LAPAROSCOPIC with orifice of cecal polyp;  Surgeon: Leighton Ruff, MD;  Location: [facility];  Service: General;;   LEFT HEART CATH AND CORONARY ANGIOGRAPHY N/A 04/07/2017   Procedure: Left  Heart Cath and Coronary Angiography;  Surgeon: Nelva Bush, MD;  Location: [place] [facility];  Service: Cardiovascular;  Laterality: N/A;   PARATHYROIDECTOMY     Rt Superior open neck exploration   POLYPECTOMY     RIGHT OOPHORECTOMY     TONSILLECTOMY      Current Medications: Current Outpatient Medications on File Prior to Visit  Medication Sig   acetaminophen (TYLENOL) 500 MG tablet Take 500 mg by mouth every 4 (four) hours as needed.   b complex vitamins capsule Take 1 capsule by mouth daily.   Cholecalciferol (VITAMIN D) 50 MCG (2000 UT) CAPS Take 1  capsule by mouth daily.   clonazePAM (KLONOPIN) 0.25 MG disintegrating tablet Take 0.25 mg by mouth daily as needed for seizure.   denosumab (PROLIA) 60 MG/ML SOSY injection Inject 60 mg into the skin every 6 (six) months.   losartan (COZAAR) 50 MG tablet Take 25 mg by mouth 2 (two) times daily.   magnesium oxide (MAG-OX) 400 MG tablet Take 400 mg by mouth daily.   polyethylene glycol (MIRALAX / GLYCOLAX) 17 g packet Take 17 g by mouth daily. Give for 3 days and hold for 2 days   senna (SENOKOT) 8.6 MG TABS tablet Take 2 tablets by mouth daily.   tiZANidine (ZANAFLEX) 2 MG tablet Take 1 tablet (2 mg total) by mouth every 8 (eight) hours as needed for muscle spasms.   traMADol (ULTRAM) 50 MG tablet Take 0.5-1 tablets (25-50 mg total) by mouth 2 (two) times daily as needed for severe pain.   No current facility-administered medications on file prior to visit.     Allergies:   Anesthetics, amide and Lisinopril   Social History   Tobacco Use   Smoking status: Never   Smokeless tobacco: Never  Vaping Use   Vaping Use: Never used  Substance Use Topics   Alcohol use: Yes    Comment: 1 glass of wine per day   Drug use: No    Family History: The patient's family history includes Breast cancer in her paternal aunt; Diabetes in an other family member; Heart disease in her mother; Heart disease (age of onset: 38) in her father; Sudden death (age of onset: 27) in her father. There is no history of Colon cancer or Stomach cancer.  ROS:   Please see the history of present illness.  Additional pertinent ROS otherwise unremarkable.   EKGs/Labs/Other Studies Reviewed:    The following studies were reviewed today: LHC (04/07/17): Minimal CAD involving large epicardial coronary arteries. Mild to moderate stenosis of diagonal branches is evident. Normal LV contraction. Normal LV filling pressure. Small right radial artery.   Pharmacologic myocardial perfusion stress test (03/24/17): High risk study  with small in size, mild in severity, basal inferoseptal and mid inferoseptal defects that could reflect diaphragmatic attenuation and/or ischemia. Is also a small in size, moderate in severity, partially reversible apical defect. Moderate in size, moderate in severity, basal anteroseptal, mid anteroseptal, and apical septal defect is reversible and concerning for ischemia. LVEF 67%.   Holter monitor (02/18/17): Predominantly sinus rhythm with rare PACs and PVCs. No significant arrhythmias.  EKG:  EKG is personally reviewed.  The ekg ordered 04/09/21 demonstrates sinus rhythm at 68 bpm  Recent Labs: 01/30/2021: TSH 1.42 04/05/2021: BUN 19; Creatinine, Ser 0.83; Hemoglobin 12.8; Platelets 195; Potassium 3.8; Sodium 140  Recent Lipid Panel    Component Value Date/Time   CHOL 132 01/25/2015 1112   TRIG 64 01/25/2015 1112   HDL  68 01/25/2015 1112   CHOLHDL 1.9 01/25/2015 1112   VLDL 13 01/25/2015 1112   LDLCALC 51 01/25/2015 1112   LDLDIRECT 156.3 03/28/2011 1038    Physical Exam:    VS:  BP 130/80 (BP Location: Left Arm, Patient Position: Sitting, Cuff Size: Normal)   Pulse 61   Resp 18   Ht _0  (1.499 m)   Wt 106 lb (48.1 kg)   SpO2 99%   BMI 21.41 kg/m     Wt Readings from Last 3 Encounters:  05/10/21 106 lb (48.1 kg)  05/09/21 106 lb 3.2 oz (48.2 kg)  04/24/21 107 lb 3.2 oz (48.6 kg)    GEN: Well nourished, well developed in no acute distress HEENT: Normal, moist mucous membranes NECK: No JVD CARDIAC: regular rhythm, normal S1 and S2, no rubs or gallops. No murmur. VASCULAR: Radial and DP pulses 2+ bilaterally. No carotid bruits RESPIRATORY:  Clear to auscultation without rales, wheezing or rhonchi  ABDOMEN: Soft, non-tender, non-distended MUSCULOSKELETAL:  Ambulates independently SKIN: Warm and dry, no edema NEUROLOGIC:  Alert and oriented x 3. No focal neuro deficits noted. PSYCHIATRIC:  Normal affect    ASSESSMENT:    1. Essential hypertension   2. Labile blood  pressure   3. Nonocclusive coronary atherosclerosis of native coronary artery   4. Cardiac risk counseling    PLAN:    nonobstructive CAD  -on cath 2018 -continue aspirin, atorvastatin -counseled on red flag warning signs that need immediate medical attention  Hypertension: labile -on losartan 25 mg BID -limited options for additional meds. No beta blocker due to history of bradycardia. With intermittent low BP, would like to avoid thiazide/loop/MRA to avoid dehydration/orthostasis. Also has history of kidney stones. She was on amlodipine in the past, will trial very low dose of this to see if we can level BP without hypotension  Cardiac risk counseling and prevention recommendations: -recommend heart healthy/Mediterranean diet, with whole grains, fruits, vegetable, fish, lean meats, nuts, and olive oil. Limit salt. -recommend moderate walking, 3-5 times/week for 30-50 minutes each session. Aim for at least 150 minutes.week. Goal should be pace of 3 miles/hours, or walking 1.5 miles in 30 minutes -recommend avoidance of tobacco products. Avoid excess alcohol. -on aspirin 81 mg daily per personal preference  Plan for follow up: 3 mos  Medication Adjustments/Labs and Tests Ordered: Current medicines are reviewed at length with the patient today.  Concerns regarding medicines are outlined above.  No orders of the defined types were placed in this encounter.  Meds ordered this encounter  Medications   amLODipine (NORVASC) 2.5 MG tablet    Sig: Take 1 tablet (2.5 mg total) by mouth at bedtime.    Dispense:  90 tablet    Refill:  3     Patient Instructions  Medication Instructions:  Restart Amlodipine 2.5 mg daily  *If you need a refill on your cardiac medications before your next appointment, please call your pharmacy*   Lab Work: None ordered today   Testing/Procedures: None ordered today   Follow-Up: At [facility], you and your health needs are our priority.  As  part of our continuing mission to provide you with exceptional heart care, we have created designated Provider Care Teams.  These Care Teams include your primary Cardiologist (physician) and Advanced Practice Providers (APPs -  Physician Assistants and Nurse Practitioners) who all work together to provide you with the care you need, when you need it.  We recommend signing up for the patient portal  called "MyChart".  Sign up information is provided on this After Visit Summary.  MyChart is used to connect with patients for Virtual Visits (Telemedicine).  Patients are able to view lab/test results, encounter notes, upcoming appointments, etc.  Non-urgent messages can be sent to your provider as well.   To learn more about what you can do with MyChart, go to [email or web address].    Your next appointment:   3 month(s) @ [address] [place], [place] [address]   The format for your next appointment:   In Person  Provider:   Buford Dresser, MD     Signed, Buford Dresser, MD PhD 05/10/2021    [place]

## 2021-05-14 ENCOUNTER — Encounter: Payer: Self-pay | Admitting: Internal Medicine

## 2021-05-14 NOTE — Assessment & Plan Note (Signed)
Consider switching from Prolia to Cricket for new fractures

## 2021-05-14 NOTE — Assessment & Plan Note (Addendum)
New.  Thoracic spine x-ray reveals an old T11 vertebral fracture.  2 new compression vertebral fracture at T7 and T8 vertebrae, which are probably responsible for  thoracic back pain. If  not better by Monday next week, we should refer pt to see a specialist.  Trigger point injection to help with a spastic pain on the right

## 2021-05-15 ENCOUNTER — Ambulatory Visit (INDEPENDENT_AMBULATORY_CARE_PROVIDER_SITE_OTHER): Payer: Medicare Other | Admitting: Sports Medicine

## 2021-05-15 ENCOUNTER — Encounter: Payer: Self-pay | Admitting: Sports Medicine

## 2021-05-15 ENCOUNTER — Other Ambulatory Visit: Payer: Self-pay

## 2021-05-15 VITALS — BP 171/81 | Ht 59.0 in | Wt 105.0 lb

## 2021-05-15 DIAGNOSIS — I2510 Atherosclerotic heart disease of native coronary artery without angina pectoris: Secondary | ICD-10-CM

## 2021-05-15 DIAGNOSIS — M4004 Postural kyphosis, thoracic region: Secondary | ICD-10-CM

## 2021-05-15 DIAGNOSIS — S22000A Wedge compression fracture of unspecified thoracic vertebra, initial encounter for closed fracture: Secondary | ICD-10-CM | POA: Diagnosis not present

## 2021-05-15 NOTE — Patient Instructions (Signed)
Thank you for coming in to see [place] today! Please see below to review our plan for today's visit:   1.  Please start doing the home exercise program as outlined below:  -Lateral bending to the left -Rotation of the spine to the left and right -Wall stretch exercise -Floor stretching exercise that you were doing with your physical therapist previously -Single-leg balance 2.  Please also use the heel lifts that you are given today in your shoes when you are walking around.  Please put it in your right shoe.  Lets plan to follow-up in 1 to 2 months to reevaluate.   Please call the clinic at [phone] if your symptoms worsen or you have any concerns. It was our pleasure to serve you.       Dr. Dagoberto Ligas Dr. Andreas Blower [facility]

## 2021-05-15 NOTE — Progress Notes (Signed)
   PCP: Plotnikov, Evie Lacks, MD  Subjective:   HPI: Patient is a 80 y.o. female with history of T11 compression fracture, severe osteoporosis on Prolia, hyperparathyroidism here for evaluation of back pain.  She reports that she started having worsening pain sometime in January 2022.  Around that time, she did note that she had an episode where she bent over to pick something up and sneezed and felt sudden pain in her back, and it seemed to persist since then.  She presented to her PCP, who after further work-up discovered a kidney stone in her left kidney.  She underwent ESWT for this, thinking it may be the cause of her pain however she did not have any resolution of her pain.  Today, her pain is actually on her right side.  She has midline thoracic pain which radiates to the right side of her rib cage to the lateral aspect of her ribs.  Seems to be worse with prolonged walking, worse with forward flexion.  She has had several falls due to gait instability.  She is unaware of any numbness or tingling, no significant new weakness.  More recently, she presented to a new PCP, Dr. Alain Marion, who ordered an x-ray of her thoracic spine.  This showed new T7/T8 compression fractures since her previous x-ray and her old previously known T11 compression fracture.     Review of Systems:  Per HPI.   [place], medications and smoking status reviewed.      Objective:  Physical Exam:  No flowsheet data found.  BP (!) 171/81   Ht 4\' 11"  (1.499 m)   Wt 105 lb (47.6 kg)   BMI 21.21 kg/m   Gen: awake, alert, NAD, comfortable in exam room Pulm: breathing unlabored  Spine: -Inspection: Patient has significant kyphosis of the thoracic spine.  Significant dextroscoliosis also noted.  With walking, she has obvious Trendelenburg gait with weakness of the left gluteus medius.  Right shoulder significantly dropped compared to the left shoulder.  She has some gait instability with walking. -Palpation: Mild  tenderness to palpation along the thoracic midline, very mild tenderness palpation diffusely across the right rib cage as well. -ROM: Significantly limited range of motion in all planes of motion of the thoracic spine and cervical spine. -Neurovascularly intact in bilateral upper and bilateral lower extremities    Assessment & Plan:  1.  T7 and T8 thoracic compression fractures in setting of osteoporosis 2.  Significant thoracic kyphoscoliosis  Patient with fairly recently diagnosed thoracic compression fractures, which I suspect to be the primary cause of her more recent pain.  She has obvious spinal abnormalities with significant kyphoscoliosis which is a predisposing factor for this along with her osteoporosis.  At this point, she has significant weakness of the spinal musculature diffusely, and also has a Trendelenburg gait.  Given this, will work on strengthening and attempt to correct posture is much as possible.  Plan: -Start HEP as no below:  -Lateral bending to the left -Rotation of the spine to the left and right -Wall stretch exercise -I & T exercises -Single-leg balance -Heel lift placed in right shoe -Follow-up in 1 to 2 months to reevaluate    Dagoberto Ligas, MD [facility] Fellow 05/15/2021 10:00 AM I observed and examined the patient with the [facility] resident and agree with assessment and plan.  Note reviewed and modified by me. Ila Mcgill, MD

## 2021-05-17 ENCOUNTER — Encounter: Payer: Self-pay | Admitting: Cardiology

## 2021-05-17 DIAGNOSIS — F341 Dysthymic disorder: Secondary | ICD-10-CM | POA: Diagnosis not present

## 2021-05-17 DIAGNOSIS — I10 Essential (primary) hypertension: Secondary | ICD-10-CM | POA: Diagnosis not present

## 2021-05-17 DIAGNOSIS — I480 Paroxysmal atrial fibrillation: Secondary | ICD-10-CM | POA: Diagnosis not present

## 2021-05-17 DIAGNOSIS — F320 Major depressive disorder, single episode, mild: Secondary | ICD-10-CM | POA: Diagnosis not present

## 2021-05-17 DIAGNOSIS — E7800 Pure hypercholesterolemia, unspecified: Secondary | ICD-10-CM | POA: Diagnosis not present

## 2021-05-17 DIAGNOSIS — M810 Age-related osteoporosis without current pathological fracture: Secondary | ICD-10-CM | POA: Diagnosis not present

## 2021-05-17 DIAGNOSIS — N132 Hydronephrosis with renal and ureteral calculous obstruction: Secondary | ICD-10-CM | POA: Diagnosis not present

## 2021-05-17 DIAGNOSIS — G4700 Insomnia, unspecified: Secondary | ICD-10-CM | POA: Diagnosis not present

## 2021-05-17 DIAGNOSIS — I2510 Atherosclerotic heart disease of native coronary artery without angina pectoris: Secondary | ICD-10-CM | POA: Diagnosis not present

## 2021-05-18 ENCOUNTER — Encounter (HOSPITAL_BASED_OUTPATIENT_CLINIC_OR_DEPARTMENT_OTHER): Payer: Self-pay

## 2021-06-12 ENCOUNTER — Ambulatory Visit (INDEPENDENT_AMBULATORY_CARE_PROVIDER_SITE_OTHER): Payer: Medicare Other | Admitting: Internal Medicine

## 2021-06-12 ENCOUNTER — Encounter: Payer: Self-pay | Admitting: Internal Medicine

## 2021-06-12 ENCOUNTER — Other Ambulatory Visit: Payer: Self-pay

## 2021-06-12 DIAGNOSIS — R42 Dizziness and giddiness: Secondary | ICD-10-CM

## 2021-06-12 DIAGNOSIS — F413 Other mixed anxiety disorders: Secondary | ICD-10-CM | POA: Diagnosis not present

## 2021-06-12 DIAGNOSIS — G8929 Other chronic pain: Secondary | ICD-10-CM

## 2021-06-12 DIAGNOSIS — M546 Pain in thoracic spine: Secondary | ICD-10-CM | POA: Diagnosis not present

## 2021-06-12 DIAGNOSIS — F419 Anxiety disorder, unspecified: Secondary | ICD-10-CM | POA: Insufficient documentation

## 2021-06-12 DIAGNOSIS — I2510 Atherosclerotic heart disease of native coronary artery without angina pectoris: Secondary | ICD-10-CM

## 2021-06-12 DIAGNOSIS — R2681 Unsteadiness on feet: Secondary | ICD-10-CM

## 2021-06-12 MED ORDER — LIDOCAINE-EPINEPHRINE 2 %-1:100000 IJ SOLN
1.0000 mL | Freq: Once | INTRAMUSCULAR | Status: AC
  Administered 2021-06-12: 1 mL

## 2021-06-12 MED ORDER — SENNA-DOCUSATE SODIUM 8.6-50 MG PO TABS: 1.0000 | ORAL_TABLET | Freq: Two times a day (BID) | ORAL | 5 refills | Status: DC

## 2021-06-12 MED ORDER — DIAZEPAM 5 MG PO TABS: 5.0000 mg | ORAL_TABLET | Freq: Two times a day (BID) | ORAL | 3 refills | Status: DC | PRN

## 2021-06-12 MED ORDER — METHYLPREDNISOLONE ACETATE 40 MG/ML IJ SUSP
20.0000 mg | Freq: Once | INTRAMUSCULAR | Status: AC
  Administered 2021-06-12: 20 mg

## 2021-06-12 NOTE — Assessment & Plan Note (Signed)
Use trekking pole or poles

## 2021-06-12 NOTE — Assessment & Plan Note (Addendum)
Using Diazepam prn - a low dose No side effects Try trekking poles for stability

## 2021-06-12 NOTE — Assessment & Plan Note (Addendum)
Better  Will inject one more spot on the R

## 2021-06-12 NOTE — Patient Instructions (Addendum)
Try trekking pole or poles Compression knee highs or socks

## 2021-06-12 NOTE — Assessment & Plan Note (Signed)
Occasional  D/c clonazepam Re-start Diazepam  Low dose

## 2021-06-22 ENCOUNTER — Telehealth: Payer: Self-pay | Admitting: *Deleted

## 2021-06-22 NOTE — Telephone Encounter (Signed)
-----   Message from [place] sent at 06/21/2021 10:10 AM EDT ----- For review

## 2021-06-22 NOTE — Telephone Encounter (Signed)
Abstracted into chart../lmb 

## 2021-06-25 NOTE — Progress Notes (Signed)
Subjective:  Patient ID: [name], female    DOB: [birth year]  Age: 80 y.o. MRN: [id number]  CC: Follow-up (1 month f/u)   HPI [name] presents for thoracic back pain, compression fracture, osteoporosis, anxiety, unsteady gait.  Her pain is much better except for 1 spot on the right  Outpatient Medications Prior to Visit  Medication Sig Dispense Refill   acetaminophen (TYLENOL) 500 MG tablet Take 500 mg by mouth every 4 (four) hours as needed.     amLODipine (NORVASC) 2.5 MG tablet Take 1 tablet (2.5 mg total) by mouth at bedtime. 90 tablet 3   b complex vitamins capsule Take 1 capsule by mouth daily.     Cholecalciferol (VITAMIN D) 50 MCG (2000 UT) CAPS Take 1 capsule by mouth daily.     denosumab (PROLIA) 60 MG/ML SOSY injection Inject 60 mg into the skin every 6 (six) months.     losartan (COZAAR) 50 MG tablet Take 25 mg by mouth 2 (two) times daily.     magnesium oxide (MAG-OX) 400 MG tablet Take 400 mg by mouth daily.     polyethylene glycol (MIRALAX / GLYCOLAX) 17 g packet Take 17 g by mouth daily. Give for 3 days and hold for 2 days     senna (SENOKOT) 8.6 MG TABS tablet Take 2 tablets by mouth daily.     clonazePAM (KLONOPIN) 0.25 MG disintegrating tablet Take 0.25 mg by mouth daily as needed for seizure. (Patient not taking: Reported on 06/12/2021)     tiZANidine (ZANAFLEX) 2 MG tablet Take 1 tablet (2 mg total) by mouth every 8 (eight) hours as needed for muscle spasms. (Patient not taking: Reported on 06/12/2021) 60 tablet 0   traMADol (ULTRAM) 50 MG tablet Take 0.5-1 tablets (25-50 mg total) by mouth 2 (two) times daily as needed for severe pain. (Patient not taking: Reported on 06/12/2021) 60 tablet 1   No facility-administered medications prior to visit.    ROS: Review of Systems  Constitutional:  Positive for fatigue. Negative for activity change, appetite change, chills and unexpected weight change.  HENT:  Negative for congestion, mouth sores and sinus  pressure.   Eyes:  Negative for visual disturbance.  Respiratory:  Negative for cough and chest tightness.   Gastrointestinal:  Negative for abdominal pain and nausea.  Genitourinary:  Negative for difficulty urinating, frequency and vaginal pain.  Musculoskeletal:  Positive for back pain. Negative for gait problem.  Skin:  Negative for pallor and rash.  Neurological:  Positive for weakness. Negative for dizziness, tremors, numbness and headaches.  Psychiatric/Behavioral:  Positive for dysphoric mood. Negative for confusion, sleep disturbance and suicidal ideas. The patient is nervous/anxious.    Objective:  BP 136/68 (BP Location: Left Arm)   Pulse 62   Temp 98.4 F (36.9 C) (Oral)   Wt 110 lb 3.2 oz (50 kg)   SpO2 96%   BMI 22.26 kg/m   BP Readings from Last 3 Encounters:  06/12/21 136/68  05/15/21 (!) 171/81  05/10/21 130/80    Wt Readings from Last 3 Encounters:  06/12/21 110 lb 3.2 oz (50 kg)  05/15/21 105 lb (47.6 kg)  05/10/21 106 lb (48.1 kg)    Physical Exam Constitutional:      General: She is not in acute distress.    Appearance: She is well-developed.  HENT:     Head: Normocephalic.     Right Ear: External ear normal.     Left Ear: External ear normal.  Nose: Nose normal.  Eyes:     General:        Right eye: No discharge.        Left eye: No discharge.     Conjunctiva/sclera: Conjunctivae normal.     Pupils: Pupils are equal, round, and reactive to light.  Neck:     Thyroid: No thyromegaly.     Vascular: No JVD.     Trachea: No tracheal deviation.  Cardiovascular:     Rate and Rhythm: Normal rate and regular rhythm.     Heart sounds: Normal heart sounds.  Pulmonary:     Effort: No respiratory distress.     Breath sounds: No stridor. No wheezing.  Abdominal:     General: Bowel sounds are normal. There is no distension.     Palpations: Abdomen is soft. There is no mass.     Tenderness: There is no abdominal tenderness. There is no guarding or  rebound.  Musculoskeletal:        General: Tenderness present.     Cervical back: Normal range of motion and neck supple. No rigidity.  Lymphadenopathy:     Cervical: No cervical adenopathy.  Skin:    Findings: No erythema or rash.  Neurological:     Cranial Nerves: No cranial nerve deficit.     Motor: No abnormal muscle tone.     Coordination: Coordination normal.     Deep Tendon Reflexes: Reflexes normal.  Psychiatric:        Behavior: Behavior normal.        Thought Content: Thought content normal.        Judgment: Judgment normal.  Trace edema of legs/ankles Painful the right trap on the right thoracic paraspinal muscles    Procedure Note :    Trigger Point Injection:   Indication : Focal tender area identifiable by the location without other identifiable neurologic or musculoskeletal finding or pathology.   Risks including unsuccessful procedure , bleeding, infection, bruising, skin atrophy and others were explained to the patient in detail as well as the benefits. Informed consent was obtained and signed.   Tthe patient was placed in a comfortable position.  3  points of maximum tenderness over right thoracic paraspinal and trapezius muscles were marked and  the skin was prepped with Betadine and alcohol. 1 inch 25-gauge needle was used. The needle was advanced perpendicular to the skin. Each trigger point was injected with 1 mL of 2% lidocaine and 10 mg of Depo-Medrol in a usual fashion.  Band-Aids applied.   Tolerated well. Complications: None. Good pain relief following the procedure.   Lab Results  Component Value Date   WBC 7.0 04/05/2021   HGB 12.8 04/05/2021   HCT 38.3 04/05/2021   PLT 195 04/05/2021   GLUCOSE 92 04/05/2021   CHOL 132 01/25/2015   TRIG 64 01/25/2015   HDL 68 01/25/2015   LDLDIRECT 156.3 03/28/2011   LDLCALC 51 01/25/2015   ALT 18 12/18/2017   AST 29 12/18/2017   NA 140 04/05/2021   K 3.8 04/05/2021   CL 104 04/05/2021   CREATININE 0.83  04/05/2021   BUN 19 04/05/2021   CO2 27 04/05/2021   TSH 1.42 01/30/2021   INR 1.0 04/01/2017    DG Ribs Unilateral W/Chest Right  Result Date: 04/05/2021 CLINICAL DATA:  Back pain EXAM: RIGHT RIBS AND CHEST - 3+ VIEW COMPARISON:  12/18/2017, CT 10/15/2016 FINDINGS: Single-view chest demonstrates trace pleural effusion or thickening. Stable cardiomediastinal silhouette with borderline cardiomegaly and  tortuous calcified aorta. No pneumothorax. Right rib series demonstrates age indeterminate deformities at right fourth through sixth anterolateral ribs. IMPRESSION: 1. Trace pleural effusions or thickening.  Borderline cardiomegaly. 2. Age indeterminate minimal rib fractures at right fourth through sixth anterolateral ribs Electronically Signed   By: Donavan Foil M.D.   On: 04/05/2021 17:42    Assessment & Plan:      Meds ordered this encounter  Medications   diazepam (VALIUM) 5 MG tablet    Sig: Take 1 tablet (5 mg total) by mouth every 12 (twelve) hours as needed for anxiety or muscle spasms.    Dispense:  60 tablet    Refill:  3   sennosides-docusate sodium (SENOKOT-S) 8.6-50 MG tablet    Sig: Take 1 tablet by mouth in the morning and at bedtime.    Dispense:  100 tablet    Refill:  5   methylPREDNISolone acetate (DEPO-MEDROL) injection 20 mg   lidocaine-EPINEPHrine (XYLOCAINE W/EPI) 2 %-1:100000 (with pres) injection 1 mL     Follow-up: Return in about 3 months (around 09/12/2021) for a follow-up visit.  Walker Kehr, MD

## 2021-06-26 ENCOUNTER — Other Ambulatory Visit: Payer: Self-pay

## 2021-06-26 ENCOUNTER — Ambulatory Visit (INDEPENDENT_AMBULATORY_CARE_PROVIDER_SITE_OTHER): Payer: Medicare Other | Admitting: Sports Medicine

## 2021-06-26 DIAGNOSIS — I2510 Atherosclerotic heart disease of native coronary artery without angina pectoris: Secondary | ICD-10-CM | POA: Diagnosis not present

## 2021-06-26 DIAGNOSIS — S22000A Wedge compression fracture of unspecified thoracic vertebra, initial encounter for closed fracture: Secondary | ICD-10-CM

## 2021-06-26 NOTE — Progress Notes (Signed)
PCP: Plotnikov, Evie Lacks, MD  Subjective:   HPI: Patient is a 80 y.o. female here for follow-up of history of compression fractures and severe osteoporosis with back pain. She originally had T11 compression fracture, but had x-ray on 05/09/21 demonstrating interval T7 and T8 compression deformities.   Since her last visit, she has been doing her home exercises a few times a week. She is also doing "Flex and Flow" at [employer] 2-3x weekly. Her back pain is much improved from before.  She no longer has consistent back pain.  At times when she is stated in leaning forward she will have some pain in the thoracic/lumbar spine, however this pain will be relieved with her bursitis.  She is still taking Prolia every 6 months.  She denies any new injuries.  She did get seen by the orthopedic and reports having cortisone injection into the back which did provide some relief.  She also states they gave her a back brace to help with her kyphosis, this does help her improve her posture but at times she will have pain in the right rib cage from the compression.    Ht '4\' 11"'$  (1.499 m)   Wt 108 lb (49 kg)   BMI 21.81 kg/m   No flowsheet data found.  No flowsheet data found.  Review of Systems: See HPI above.     Objective:  Physical Exam:  Gen: Well-appearing, in no acute distress; non-toxic Resp: Breathing unlabored on room air; no wheezing. Psych: Fluid speech in conversation; appropriate affect; normal thought process Neuro: Sensation intact throughout. No gross coordination deficits.  MSK:   Lumbar spine: -Inspection: Patient has significant kyphosis of the thoracic spine.  Significant dextroscoliosis also noted in the lower thoracic and upper lumbar region.   -Palpation: No significant tenderness to palpation of the thoracic and lumbar spinous processes. She does have some very mild tenderness palpation diffusely across the right rib cage as well, Specifically around the right rib 11 and  12. -ROM: Somewhat limited range of motion in all directions, but able to sit straight with the back and about 0 of extension and lean forward to approximate 40 without significant pain. -Neurovascularly intact in bilateral upper and bilateral lower extremities     Assessment & Plan:  1.  T7 and T8 thoracic compression fractures in setting of osteoporosis 2.  Significant thoracic kyphoscoliosis  [name] is much improved with resolution of pain a few months out from her compression fractures.  She continues to do the home exercises a few times a week as well as "Flex and Flow" at [employer].  She denies no new injury.  She does note that the exercises and wearing the back brace at times do help improve her posture.  She may follow-up on as needed, unless new pain arises. We discussed continuing HEP as below: Continue these exercises at home:  -Lateral bending to the left -Rotation of the spine to the left and right -Wall stretch exercise -I & T exercises - Heel hops standing and heel padding seated - Work on sit-to-stand exercises for the hip as well  Elba Barman, DO PGY-4, Sports Medicine Fellow [place]  I observed and examined the patient with the [facility] resident and agree with assessment and plan.  Note reviewed and modified by me. Ila Mcgill, MD

## 2021-06-26 NOTE — Patient Instructions (Signed)
It was great to see meet today, thank you for letting me participate in your care!  Today, we discussed continuing exercises to strengthen your back, spine, and core.  Continue these exercises at home:  -Lateral bending to the left -Rotation of the spine to the left and right -Wall stretch exercise -I & T exercises - Heel hops standing and heel padding seated - Work on sit-to-stand exercises for the hip as well  You will follow-up as needed if any issues arise.  If you have any further questions, please give the clinic a call [phone].  [place],  Elba Barman, DO PGY-4, Sports Medicine Fellow [place]

## 2021-06-26 NOTE — Assessment & Plan Note (Signed)
This is improving with HEP and conservative care  Can use the brace PRN as it seems to hurt her ribs  Continue with classes for exercise as well as HEP

## 2021-07-03 DIAGNOSIS — Z23 Encounter for immunization: Secondary | ICD-10-CM | POA: Diagnosis not present

## 2021-07-18 DIAGNOSIS — M810 Age-related osteoporosis without current pathological fracture: Secondary | ICD-10-CM | POA: Diagnosis not present

## 2021-07-18 DIAGNOSIS — Z681 Body mass index (BMI) 19 or less, adult: Secondary | ICD-10-CM | POA: Diagnosis not present

## 2021-07-18 DIAGNOSIS — M4854XA Collapsed vertebra, not elsewhere classified, thoracic region, initial encounter for fracture: Secondary | ICD-10-CM | POA: Diagnosis not present

## 2021-07-18 DIAGNOSIS — R768 Other specified abnormal immunological findings in serum: Secondary | ICD-10-CM | POA: Diagnosis not present

## 2021-07-26 ENCOUNTER — Telehealth: Payer: Self-pay | Admitting: Internal Medicine

## 2021-07-26 NOTE — Telephone Encounter (Signed)
Pt need to make ov to be evaluated for approval../lmb

## 2021-07-26 NOTE — Telephone Encounter (Signed)
Patient says she signed up to take a walking class for research @ [employer] but they told her bc of her swollen feet approval is needed from her provider  Contact Info:  Dr. Wyonia Hough Researcher [phone]  Please call patient if any more info is needed [phone]

## 2021-07-29 ENCOUNTER — Encounter (HOSPITAL_BASED_OUTPATIENT_CLINIC_OR_DEPARTMENT_OTHER): Payer: Self-pay

## 2021-08-21 ENCOUNTER — Other Ambulatory Visit: Payer: Self-pay

## 2021-08-21 ENCOUNTER — Ambulatory Visit (INDEPENDENT_AMBULATORY_CARE_PROVIDER_SITE_OTHER)
Admission: RE | Admit: 2021-08-21 | Discharge: 2021-08-21 | Disposition: A | Discharge status: Home / Self Care | Payer: Medicare Other | Source: Ambulatory Visit | Attending: Physician Assistant | Admitting: Physician Assistant

## 2021-08-21 ENCOUNTER — Encounter: Payer: Self-pay | Admitting: Physician Assistant

## 2021-08-21 ENCOUNTER — Ambulatory Visit (INDEPENDENT_AMBULATORY_CARE_PROVIDER_SITE_OTHER): Payer: Medicare Other | Admitting: Physician Assistant

## 2021-08-21 VITALS — BP 146/92 | HR 63 | Ht 59.0 in | Wt 109.0 lb

## 2021-08-21 DIAGNOSIS — K5900 Constipation, unspecified: Secondary | ICD-10-CM | POA: Diagnosis not present

## 2021-08-21 DIAGNOSIS — I2510 Atherosclerotic heart disease of native coronary artery without angina pectoris: Secondary | ICD-10-CM

## 2021-08-21 DIAGNOSIS — R109 Unspecified abdominal pain: Secondary | ICD-10-CM | POA: Diagnosis not present

## 2021-08-21 MED ORDER — LINACLOTIDE 72 MCG PO CAPS: 72.0000 ug | ORAL_CAPSULE | Freq: Every day | ORAL | 5 refills | Status: DC

## 2021-08-21 NOTE — Patient Instructions (Signed)
Your provider has requested that you have an abdominal x ray before leaving today. Please go to the basement floor to our Radiology department for the test.  We have sent the following medications to your pharmacy for you to pick up at your convenience: Linzess 72 mcg daily before breakfast.   If you are age 81 or older, your body mass index should be between 23-30. Your Body mass index is 22.02 kg/m. If this is out of the aforementioned range listed, please consider follow up with your Primary Care Provider.  If you are age 52 or younger, your body mass index should be between 19-25. Your Body mass index is 22.02 kg/m. If this is out of the aformentioned range listed, please consider follow up with your Primary Care Provider.   __________________________________________________________  The [place] [facility] providers would like to encourage you to use [facility] to communicate with providers for non-urgent requests or questions.  Due to long hold times on the telephone, sending your provider a message by [facility] may be a faster and more efficient way to get a response.  Please allow 48 business hours for a response.  Please remember that this is for non-urgent requests.

## 2021-08-21 NOTE — Progress Notes (Signed)
Reviewed and agree with documentation and assessment and plan. K. Veena Fama Muenchow , MD   

## 2021-08-21 NOTE — Progress Notes (Signed)
Chief Complaint: Constipation  HPI:    [name] is an 80 year old female with a past medical history as listed below including diverticulosis, osteoporosis and multiple others, known to Dr. Silverio Decamp for constipation, who presents to clinic today with a complaint of constipation.    March 2017 history of large tubulovillous adenoma in the cecum status post endoscopic removal, extending into the appendiceal orifice with incomplete removal status post extended appendectomy and partial septectomy.    06/03/2018 colonoscopy was severe sigmoid diverticulosis and internal hemorrhoids and otherwise normal.  Biopsies negative for microscopic colitis.         08/10/2018 patient seen in clinic by Dr. Silverio Decamp for constipation/IBS.At that time been dealing with diarrhea and had a negative GI pathogen panel decreased pancreatic elastase and started on Zenpep and also Colestid.  That time had been doing better with a bowel movement once every 3 to 4 days associated with some urgency.  Noted to have predominant constipation at that time.  Told to increase water intake.    Today, the patient tells me that she has "perfected constipation".  Over the past couple of months her symptoms seem to be worse with some accompanying generalized abdominal pain and even nausea with a decrease in appetite as well as a feeling like the top of her abdomen is "rockhard", when she has not had a bowel movement in days.  Tells me that her PCP added Senokot once daily and she is taking this but still does not have a bowel movement for about 4 days and when she does it is very hard to pass followed by a lot of "water".  Tells me she was using MiraLAX daily but this only resulted in water every few days.  She wonders if she has some sort of obstruction.    Denies fever, chills, weight loss, blood in her stool or symptoms that awaken her from sleep.  Past Medical History:  Diagnosis Date   Abdominal pain, unspecified site    ADVERSE  REACTION TO MEDICATION 07/31/2009   ANEMIA-NOS 07/07/2007   Arthritis    Chest pain, unspecified    Chronic kidney disease    hx of kidney stone   COLONIC POLYPS, ADENOMATOUS, HX OF    Complication of anesthesia    Disturbance of skin sensation    DIVERTICULOSIS, COLON 02/23/2009   Family history of diabetes mellitus    Family history of malignant neoplasm of breast    FATIGUE 05/25/2009   Fever, unspecified    Heart murmur    hx of   HYPERLIPIDEMIA 07/07/2007   HYPERPARATHYROIDISM UNSPECIFIED 10/20/2007   HYPERTENSION 02/04/2008   Irritable bowel syndrome (IBS)    NEPHROLITHIASIS, HX OF 11/15/2008   OSTEOPOROSIS 01/01/2008   Other acquired absence of organ    parathyroidectomy   Pain in limb    PALPITATIONS, OCCASIONAL 07/12/2008   PARATHYROIDECTOMY 01/02/2009   PONV (postoperative nausea and vomiting)    SCOLIOSIS 05/25/2009   Seasonal allergies    Unspecified constipation    URINARY INCONTINENCE 07/07/2007   UTI'S, RECURRENT 11/20/2009   kimbrough     Past Surgical History:  Procedure Laterality Date   BUNIONECTOMY     CATARACT EXTRACTION, BILATERAL     COLONOSCOPY     EXTRACORPOREAL SHOCK WAVE LITHOTRIPSY Left 12/21/2020   Procedure: EXTRACORPOREAL SHOCK WAVE LITHOTRIPSY (ESWL);  Surgeon: Festus Aloe, MD;  Location: [facility];  Service: Urology;  Laterality: Left;   LAPAROSCOPIC APPENDECTOMY  01/19/2016   Procedure: APPENDECTOMY LAPAROSCOPIC  with orifice of cecal polyp;  Surgeon: Leighton Ruff, MD;  Location: [facility];  Service: General;;   LEFT HEART CATH AND CORONARY ANGIOGRAPHY N/A 04/07/2017   Procedure: Left Heart Cath and Coronary Angiography;  Surgeon: Nelva Bush, MD;  Location: [facility];  Service: Cardiovascular;  Laterality: N/A;   PARATHYROIDECTOMY     Rt Superior open neck exploration   POLYPECTOMY     RIGHT OOPHORECTOMY     TONSILLECTOMY      Current Outpatient Medications  Medication Sig Dispense Refill   acetaminophen (TYLENOL)  500 MG tablet Take 500 mg by mouth every 4 (four) hours as needed.     amLODipine (NORVASC) 2.5 MG tablet Take 1 tablet (2.5 mg total) by mouth at bedtime. 90 tablet 3   b complex vitamins capsule Take 1 capsule by mouth daily.     Cholecalciferol (VITAMIN D) 50 MCG (2000 UT) CAPS Take 1 capsule by mouth daily.     denosumab (PROLIA) 60 MG/ML SOSY injection Inject 60 mg into the skin every 6 (six) months.     diazepam (VALIUM) 5 MG tablet Take 1 tablet (5 mg total) by mouth every 12 (twelve) hours as needed for anxiety or muscle spasms. 60 tablet 3   losartan (COZAAR) 50 MG tablet Take 25 mg by mouth 2 (two) times daily.     magnesium oxide (MAG-OX) 400 MG tablet Take 400 mg by mouth daily.     polyethylene glycol (MIRALAX / GLYCOLAX) 17 g packet Take 17 g by mouth daily. Give for 3 days and hold for 2 days     sennosides-docusate sodium (SENOKOT-S) 8.6-50 MG tablet Take 1 tablet by mouth in the morning and at bedtime. 100 tablet 5   No current facility-administered medications for this visit.    Allergies as of 08/21/2021 - Review Complete 06/12/2021  Allergen Reaction Noted   Anesthetics, amide Other (See Comments) 02/20/2016   Lisinopril Cough 10/05/2009    Family History  Problem Relation Age of Onset   Sudden death Father 84   Heart disease Father 78       MI   Heart disease Mother        valve replacement   Diabetes Other        1st degree relative   Breast cancer Paternal Aunt    Colon cancer Neg Hx    Stomach cancer Neg Hx     Social History   Socioeconomic History   Marital status: Widowed    Spouse name: Not on file   Number of children: 3   Years of education: Not on file   Highest education level: Not on file  Occupational History   Occupation: travel agent    Employer: RETIRED  Tobacco Use   Smoking status: Never   Smokeless tobacco: Never  Vaping Use   Vaping Use: Never used  Substance and Sexual Activity   Alcohol use: Yes    Comment: 1 glass of wine  per day   Drug use: No   Sexual activity: Never  Other Topics Concern   Not on file  Social History Narrative       2-3 c coffee in am    ocass wine    G3P2vaginal delivery   Pt cell [phone]      Patient walks daily for exercise.    Social Determinants of Health   Financial Resource Strain: Not on file  Food Insecurity: Not on file  Transportation Needs: Not on file  Physical Activity: Not on file  Stress: Not on file  Social Connections: Not on file  Intimate Partner Violence: Not on file    Review of Systems:    Constitutional: No weight loss, fever or chills Cardiovascular: No chest pain  Respiratory: No SOB  Gastrointestinal: See HPI and otherwise negative Genitourinary: No dysuria or change in urinary frequency Neurological: No headache, dizziness or syncope Musculoskeletal: No new muscle or joint pain Hematologic: No bleeding or bruising Psychiatric: No history of depression or anxiety   Physical Exam:  Vital signs: BP (!) 146/92   Pulse 63   Ht 4\' 11"  (1.499 m)   Wt 109 lb (49.4 kg)   SpO2 98%   BMI 22.02 kg/m    Constitutional:   Pleasant Elderly Caucasian female appears to be in NAD, Well developed, Well nourished, alert and cooperative Head:  Normocephalic and atraumatic. Eyes:   PEERL, EOMI. No icterus. Conjunctiva pink. Ears:  Normal auditory acuity. Neck:  Supple Throat: Oral cavity and pharynx without inflammation, swelling or lesion.  Respiratory: Respirations even and unlabored. Lungs clear to auscultation bilaterally.   No wheezes, crackles, or rhonchi.  Cardiovascular: Normal S1, S2. No MRG. Regular rate and rhythm. No peripheral edema, cyanosis or pallor.  Gastrointestinal:  Soft, nondistended, nontender. No rebound or guarding.  Increased bowel sounds all 4 quadrants, no appreciable masses or hepatomegaly. Rectal:  Not performed.  Msk:  Symmetrical without gross deformities. Without edema, no deformity or joint abnormality.  Neurologic:   Alert and  oriented x4;  grossly normal neurologically.  Skin:   Dry and intact without significant lesions or rashes. Psychiatric: Demonstrates good judgement and reason without abnormal affect or behaviors.  RELEVANT LABS AND IMAGING: CBC    Component Value Date/Time   WBC 7.0 04/05/2021 1712   RBC 4.02 04/05/2021 1712   HGB 12.8 04/05/2021 1712   HGB 12.8 04/01/2017 1001   HCT 38.3 04/05/2021 1712   HCT 39.3 04/01/2017 1001   PLT 195 04/05/2021 1712   PLT 249 04/01/2017 1001   MCV 95.3 04/05/2021 1712   MCV 94 04/01/2017 1001   MCH 31.8 04/05/2021 1712   MCHC 33.4 04/05/2021 1712   RDW 13.1 04/05/2021 1712   RDW 13.8 04/01/2017 1001   LYMPHSABS 0.4 (L) 12/18/2017 0509   LYMPHSABS 1.4 04/01/2017 1001   MONOABS 0.5 12/18/2017 0509   EOSABS 0.0 12/18/2017 0509   EOSABS 0.1 04/01/2017 1001   BASOSABS 0.0 12/18/2017 0509   BASOSABS 0.1 04/01/2017 1001    CMP     Component Value Date/Time   NA 140 04/05/2021 1712   NA 142 01/10/2021 0000   K 3.8 04/05/2021 1712   CL 104 04/05/2021 1712   CO2 27 04/05/2021 1712   GLUCOSE 92 04/05/2021 1712   BUN 19 04/05/2021 1712   BUN 14 01/10/2021 0000   CREATININE 0.83 04/05/2021 1712   CREATININE 0.64 01/25/2015 1112   CALCIUM 9.3 04/05/2021 1712   CALCIUM 9.2 09/07/2010 2148   PROT 6.3 (L) 12/18/2017 0509   ALBUMIN 3.9 12/18/2017 0509   AST 29 12/18/2017 0509   ALT 18 12/18/2017 0509   ALKPHOS 57 12/18/2017 0509   BILITOT 1.0 12/18/2017 0509   GFRNONAA >60 04/05/2021 1712   GFRAA >60 12/18/2017 0509    Assessment: 1.  Constipation: Worse over the past couple of months, likely related to diverticular disease and previous colon surgery with slow transit 2.  History of abdominal surgery: history of large tubulovillous adenoma in the cecum  status post endoscopic removal, extending into the appendiceal orifice with incomplete removal status post extended appendectomy and partial septectomy in 2017  Plan: 1.  Ordered a  two-view x-ray today. 2.  Recommend the patient start Linzess 72 mcg daily.  She should stop her Senokot as it does not sound like this is working for her. 3.  Patient to follow in clinic with me in 2 to 3 months or sooner if necessary  Ellouise Newer, PA-C [facility] 08/21/2021, 11:13 AM  Cc: Plotnikov, Evie Lacks, MD

## 2021-08-22 ENCOUNTER — Encounter: Payer: Self-pay | Admitting: Internal Medicine

## 2021-08-22 ENCOUNTER — Ambulatory Visit (INDEPENDENT_AMBULATORY_CARE_PROVIDER_SITE_OTHER): Payer: Medicare Other | Admitting: Internal Medicine

## 2021-08-22 DIAGNOSIS — K5909 Other constipation: Secondary | ICD-10-CM

## 2021-08-22 DIAGNOSIS — M546 Pain in thoracic spine: Secondary | ICD-10-CM

## 2021-08-22 DIAGNOSIS — I2510 Atherosclerotic heart disease of native coronary artery without angina pectoris: Secondary | ICD-10-CM

## 2021-08-22 DIAGNOSIS — S22000A Wedge compression fracture of unspecified thoracic vertebra, initial encounter for closed fracture: Secondary | ICD-10-CM

## 2021-08-22 DIAGNOSIS — F413 Other mixed anxiety disorders: Secondary | ICD-10-CM

## 2021-08-22 DIAGNOSIS — M810 Age-related osteoporosis without current pathological fracture: Secondary | ICD-10-CM

## 2021-08-22 NOTE — Assessment & Plan Note (Addendum)
Much better. Off pain meds at present.  Continue with osteoporosis treatment

## 2021-08-22 NOTE — Assessment & Plan Note (Addendum)
Take Linzess, Miralax as needed

## 2021-08-22 NOTE — Patient Instructions (Signed)
Take Linzess, Miralax

## 2021-08-22 NOTE — Progress Notes (Signed)
Subjective:  Patient ID: [name], female    DOB: 11-Apr-1941  Age: 80 y.o. MRN: [id number]  CC: Foot Swelling and Nausea (Pt states every time she eat she gets nauseated, sick on the stomach)   HPI [name] presents for leg/ankle edema - B LE L>R F/u on constipation - given Linzess Follow-up while thoracic back pain due to compression fractures-better.  Follow-up on hypertension   Outpatient Medications Prior to Visit  Medication Sig Dispense Refill   acetaminophen (TYLENOL) 500 MG tablet Take 500 mg by mouth every 4 (four) hours as needed.     amLODipine (NORVASC) 2.5 MG tablet Take 1 tablet (2.5 mg total) by mouth at bedtime. (Patient taking differently: Take 2.5 mg by mouth daily. 1/2 tablet a day) 90 tablet 3   b complex vitamins capsule Take 1 capsule by mouth daily.     Cholecalciferol (VITAMIN D) 50 MCG (2000 UT) CAPS Take 1 capsule by mouth daily.     denosumab (PROLIA) 60 MG/ML SOSY injection Inject 60 mg into the skin every 6 (six) months.     diazepam (VALIUM) 5 MG tablet Take 1 tablet (5 mg total) by mouth every 12 (twelve) hours as needed for anxiety or muscle spasms. 60 tablet 3   linaclotide (LINZESS) 72 MCG capsule Take 1 capsule (72 mcg total) by mouth daily before breakfast. 30 capsule 5   losartan (COZAAR) 50 MG tablet Take 25 mg by mouth 2 (two) times daily.     magnesium oxide (MAG-OX) 400 MG tablet Take 400 mg by mouth daily.     polyethylene glycol (MIRALAX / GLYCOLAX) 17 g packet Take 17 g by mouth daily. Give for 3 days and hold for 2 days     sennosides-docusate sodium (SENOKOT-S) 8.6-50 MG tablet Take 1 tablet by mouth in the morning and at bedtime. 100 tablet 5   No facility-administered medications prior to visit.    ROS: Review of Systems  Constitutional:  Negative for activity change, appetite change, chills, fatigue and unexpected weight change.  HENT:  Negative for congestion, mouth sores and sinus pressure.   Eyes:  Negative for visual  disturbance.  Respiratory:  Negative for cough and chest tightness.   Gastrointestinal:  Negative for abdominal pain and nausea.  Genitourinary:  Negative for difficulty urinating, frequency and vaginal pain.  Musculoskeletal:  Positive for gait problem. Negative for back pain.  Skin:  Negative for pallor and rash.  Neurological:  Negative for dizziness, tremors, weakness, numbness and headaches.  Psychiatric/Behavioral:  Negative for confusion and sleep disturbance. The patient is nervous/anxious.    Objective:  BP 110/60 (BP Location: Left Arm)   Pulse 69   Temp 97.9 F (36.6 C) (Oral)   Ht 4\' 11"  (1.499 m)   Wt 108 lb 9.6 oz (49.3 kg)   SpO2 97%   BMI 21.93 kg/m   BP Readings from Last 3 Encounters:  08/22/21 110/60  08/21/21 (!) 146/92  06/12/21 136/68    Wt Readings from Last 3 Encounters:  08/22/21 108 lb 9.6 oz (49.3 kg)  08/21/21 109 lb (49.4 kg)  06/26/21 108 lb (49 kg)    Physical Exam Constitutional:      General: She is not in acute distress.    Appearance: She is well-developed.  HENT:     Head: Normocephalic.     Right Ear: External ear normal.     Left Ear: External ear normal.     Nose: Nose normal.  Eyes:  General:        Right eye: No discharge.        Left eye: No discharge.     Conjunctiva/sclera: Conjunctivae normal.     Pupils: Pupils are equal, round, and reactive to light.  Neck:     Thyroid: No thyromegaly.     Vascular: No JVD.     Trachea: No tracheal deviation.  Cardiovascular:     Rate and Rhythm: Normal rate and regular rhythm.     Heart sounds: Normal heart sounds.  Pulmonary:     Effort: No respiratory distress.     Breath sounds: No stridor. No wheezing.  Abdominal:     General: Bowel sounds are normal. There is no distension.     Palpations: Abdomen is soft. There is no mass.     Tenderness: There is no abdominal tenderness. There is no guarding or rebound.  Musculoskeletal:        General: No tenderness.     Cervical  back: Normal range of motion and neck supple. No rigidity.  Lymphadenopathy:     Cervical: No cervical adenopathy.  Skin:    Findings: No erythema or rash.  Neurological:     Mental Status: She is oriented to person, place, and time.     Cranial Nerves: No cranial nerve deficit.     Motor: No abnormal muscle tone.     Coordination: Coordination normal.     Gait: Gait normal.     Deep Tendon Reflexes: Reflexes normal.  Psychiatric:        Behavior: Behavior normal.        Thought Content: Thought content normal.        Judgment: Judgment normal.    Lab Results  Component Value Date   WBC 7.0 04/05/2021   HGB 12.8 04/05/2021   HCT 38.3 04/05/2021   PLT 195 04/05/2021   GLUCOSE 92 04/05/2021   CHOL 132 01/25/2015   TRIG 64 01/25/2015   HDL 68 01/25/2015   LDLDIRECT 156.3 03/28/2011   LDLCALC 51 01/25/2015   ALT 18 12/18/2017   AST 29 12/18/2017   NA 140 04/05/2021   K 3.8 04/05/2021   CL 104 04/05/2021   CREATININE 0.83 04/05/2021   BUN 19 04/05/2021   CO2 27 04/05/2021   TSH 1.42 01/30/2021   INR 1.0 04/01/2017    DG Abd 2 Views  Result Date: 08/22/2021 CLINICAL DATA:  History of constipation.  Chronic abdominal pain. EXAM: ABDOMEN - 2 VIEW COMPARISON:  12/21/2020.  CT 12/15/2020. FINDINGS: Soft tissue structures are unremarkable. Left nephrolithiasis again noted. Pelvic calcifications consistent phleboliths. Large amount of stool noted throughout the colon. No bowel distention or free air. Diffuse osteopenia. Thoracolumbar scoliosis concave left. Severe degenerative changes lumbar spine and both hips. Corticated bony density noted adjacent to the ileum, most likely old injury. Surgical clips and sutures noted the pelvis. IMPRESSION: 1.  Left nephrolithiasis again noted. 2. Large amount of stool noted throughout the colon. No bowel distention or free air. Electronically Signed   By: Marcello Moores  Register M.D.   On: 08/22/2021 11:01    Assessment & Plan:   Problem List Items  Addressed This Visit     Anxiety disorder    Diazepam as needed low-dose.  Potential benefits of a long term benzodiazepines  use as well as potential risks  and complications were explained to the patient and were aknowledged.       Compression fracture of body of thoracic vertebra (HCC)  Continue with osteoporosis treatment      Constipation    Take Linzess, Miralax as needed      Osteoporosis    Continue with injections, vitamin D and calcium.      Thoracic back pain    Much better. Off pain meds at present.  Continue with osteoporosis treatment         Follow-up: Return in about 6 weeks (around 10/03/2021) for a follow-up visit.  Walker Kehr, MD

## 2021-08-25 NOTE — Assessment & Plan Note (Signed)
Continue with osteoporosis treatment

## 2021-08-25 NOTE — Assessment & Plan Note (Signed)
Continue with injections, vitamin D and calcium.

## 2021-08-25 NOTE — Assessment & Plan Note (Signed)
Diazepam as needed low-dose.  Potential benefits of a long term benzodiazepines  use as well as potential risks  and complications were explained to the patient and were aknowledged.

## 2021-08-28 ENCOUNTER — Encounter (HOSPITAL_BASED_OUTPATIENT_CLINIC_OR_DEPARTMENT_OTHER): Payer: Self-pay | Admitting: Cardiology

## 2021-08-28 ENCOUNTER — Ambulatory Visit (INDEPENDENT_AMBULATORY_CARE_PROVIDER_SITE_OTHER): Payer: Medicare Other | Admitting: Cardiology

## 2021-08-28 ENCOUNTER — Other Ambulatory Visit: Payer: Self-pay

## 2021-08-28 VITALS — BP 160/80 | HR 60 | Ht 59.0 in | Wt 109.4 lb

## 2021-08-28 DIAGNOSIS — I10 Essential (primary) hypertension: Secondary | ICD-10-CM | POA: Diagnosis not present

## 2021-08-28 DIAGNOSIS — Z7189 Other specified counseling: Secondary | ICD-10-CM | POA: Diagnosis not present

## 2021-08-28 DIAGNOSIS — R0989 Other specified symptoms and signs involving the circulatory and respiratory systems: Secondary | ICD-10-CM | POA: Diagnosis not present

## 2021-08-28 DIAGNOSIS — I2510 Atherosclerotic heart disease of native coronary artery without angina pectoris: Secondary | ICD-10-CM | POA: Diagnosis not present

## 2021-08-28 NOTE — Patient Instructions (Signed)
Medication Instructions:  Try taking amlodipine in the morning for 1-2 weeks, then switch to the lunch time/afternoon to see which works best for better blood pressure control.   *If you need a refill on your cardiac medications before your next appointment, please call your pharmacy*   Lab Work: None ordered today   Testing/Procedures: None ordered today   Follow-Up: At [facility], you and your health needs are our priority.  As part of our continuing mission to provide you with exceptional heart care, we have created designated Provider Care Teams.  These Care Teams include your primary Cardiologist (physician) and Advanced Practice Providers (APPs -  Physician Assistants and Nurse Practitioners) who all work together to provide you with the care you need, when you need it.  We recommend signing up for the patient portal called "MyChart".  Sign up information is provided on this After Visit Summary.  MyChart is used to connect with patients for Virtual Visits (Telemedicine).  Patients are able to view lab/test results, encounter notes, upcoming appointments, etc.  Non-urgent messages can be sent to your provider as well.   To learn more about what you can do with MyChart, go to [email or web address].    Your next appointment:   1 year(s)  The format for your next appointment:   In Person  Provider:   Buford Dresser, MD

## 2021-08-28 NOTE — Progress Notes (Signed)
Cardiology Office Note:    Date:  08/28/2021   ID:  [name], DOB 09-17-1941, MRN [id number]  PCP:  Cassandria Anger, MD  Cardiologist:  Buford Dresser, MD PhD  Referring MD: Cassandria Anger, MD   CC: follow up  History of Present Illness:    [name] is a 80 y.o. female with a hx of HTN, CAD, HLD, paroxysmal atrial fibrillation (during hospitalization in 2017) who is seen for follow up today. I initially met her 07/19/2019 as a new patient to me/prior Dr. Saunders Revel for the evaluation and management of cardiovascular disease.  Today: Overall, she feels okay right now. She endorses a persistent cough, low stamina, and shortness of breath with mild exertion such as walking up a hill. Also she experiences lightheadedness if she turns her head while walking. For the past year she has been suffering from increased tremors/shivering, worse in the morning.  Her left ankle has edema that will worsen by the end of the day, and she has noticed edema in more proximal areas of her left LE as well. Her compression stockings help, but are warm and difficult to put on. At this visit, she states the floor "feels like it is vibrating."  A few days ago she visited Dr. Alain Marion and her blood pressure was 110. She has noticed her blood pressure is typically lower in the mornings, with very low readings associated with feeling generally ill and dizzy. However, her average is 016 systolic.  For her regimen, she takes her morning medication around 10 AM, and her evening medication at 8-9 PM.   Typically eats 2 meals a day, and is gradually losing weight. She does not have much of an appetite. May have yogurt and blueberries for breakfast.  At baseline she sleeps very restfully.  She denies any palpitations, or chest pain. No headaches, syncope, orthopnea, or PND.   Past Medical History:  Diagnosis Date   Abdominal pain, unspecified site    ADVERSE REACTION TO MEDICATION 07/31/2009    ANEMIA-NOS 07/07/2007   Arthritis    Chest pain, unspecified    Chronic kidney disease    hx of kidney stone   COLONIC POLYPS, ADENOMATOUS, HX OF    Complication of anesthesia    Disturbance of skin sensation    DIVERTICULOSIS, COLON 02/23/2009   Family history of diabetes mellitus    Family history of malignant neoplasm of breast    FATIGUE 05/25/2009   Fever, unspecified    Heart murmur    hx of   HYPERLIPIDEMIA 07/07/2007   HYPERPARATHYROIDISM UNSPECIFIED 10/20/2007   HYPERTENSION 02/04/2008   Irritable bowel syndrome (IBS)    NEPHROLITHIASIS, HX OF 11/15/2008   OSTEOPOROSIS 01/01/2008   Other acquired absence of organ    parathyroidectomy   Pain in limb    PALPITATIONS, OCCASIONAL 07/12/2008   PARATHYROIDECTOMY 01/02/2009   PONV (postoperative nausea and vomiting)    SCOLIOSIS 05/25/2009   Seasonal allergies    Unspecified constipation    URINARY INCONTINENCE 07/07/2007   UTI'S, RECURRENT 11/20/2009   kimbrough     Past Surgical History:  Procedure Laterality Date   BUNIONECTOMY     CATARACT EXTRACTION, BILATERAL     COLONOSCOPY     EXTRACORPOREAL SHOCK WAVE LITHOTRIPSY Left 12/21/2020   Procedure: EXTRACORPOREAL SHOCK WAVE LITHOTRIPSY (ESWL);  Surgeon: Festus Aloe, MD;  Location: [facility];  Service: Urology;  Laterality: Left;   LAPAROSCOPIC APPENDECTOMY  01/19/2016   Procedure: APPENDECTOMY LAPAROSCOPIC with orifice  of cecal polyp;  Surgeon: Leighton Ruff, MD;  Location: [facility];  Service: General;;   LEFT HEART CATH AND CORONARY ANGIOGRAPHY N/A 04/07/2017   Procedure: Left Heart Cath and Coronary Angiography;  Surgeon: Nelva Bush, MD;  Location: [place] [facility];  Service: Cardiovascular;  Laterality: N/A;   PARATHYROIDECTOMY     Rt Superior open neck exploration   POLYPECTOMY     RIGHT OOPHORECTOMY     TONSILLECTOMY      Current Medications: Current Outpatient Medications on File Prior to Visit  Medication Sig   acetaminophen (TYLENOL) 500 MG  tablet Take 500 mg by mouth every 4 (four) hours as needed.   amLODipine (NORVASC) 2.5 MG tablet Take 1 tablet (2.5 mg total) by mouth at bedtime. (Patient taking differently: Take 2.5 mg by mouth daily. 1/2 tablet a day)   b complex vitamins capsule Take 1 capsule by mouth daily.   Cholecalciferol (VITAMIN D) 50 MCG (2000 UT) CAPS Take 1 capsule by mouth daily.   denosumab (PROLIA) 60 MG/ML SOSY injection Inject 60 mg into the skin every 6 (six) months.   diazepam (VALIUM) 5 MG tablet Take 1 tablet (5 mg total) by mouth every 12 (twelve) hours as needed for anxiety or muscle spasms.   losartan (COZAAR) 50 MG tablet Take 25 mg by mouth 2 (two) times daily.   magnesium oxide (MAG-OX) 400 MG tablet Take 400 mg by mouth daily.   sennosides-docusate sodium (SENOKOT-S) 8.6-50 MG tablet Take 1 tablet by mouth in the morning and at bedtime.   No current facility-administered medications on file prior to visit.     Allergies:   Anesthetics, amide and Lisinopril   Social History   Tobacco Use   Smoking status: Never   Smokeless tobacco: Never  Vaping Use   Vaping Use: Never used  Substance Use Topics   Alcohol use: Yes    Comment: 1 glass of wine per day   Drug use: No    Family History: The patient's family history includes Breast cancer in her paternal aunt; Diabetes in an other family member; Heart disease in her mother; Heart disease (age of onset: 5) in her father; Sudden death (age of onset: 71) in her father. There is no history of Colon cancer, Stomach cancer, Esophageal cancer, or Pancreatic cancer.  ROS:   Please see the history of present illness.   (+) Cough (+) Fatigue (+) Exertional shortness of breath (+) Lightheadedness (+) LE edema (+) Loss of appetite (+) Tremulous/Shivering Additional pertinent ROS otherwise unremarkable.   EKGs/Labs/Other Studies Reviewed:    The following studies were reviewed today:  Echo 04/17/2021:  1. Left ventricular ejection fraction,  by estimation, is 60 to 65%. The  left ventricle has normal function. The left ventricle has no regional  wall motion abnormalities. There is severe asymmetric left ventricular  hypertrophy of the basal-septal  segment. Left ventricular diastolic parameters are consistent with Grade I  diastolic dysfunction (impaired relaxation).   2. Right ventricular systolic function is normal. The right ventricular  size is normal. There is normal pulmonary artery systolic pressure. The  estimated right ventricular systolic pressure is 00.7 mmHg.   3. Left atrial size was mildly dilated.   4. The mitral valve is abnormal. Trivial mitral valve regurgitation.   5. The aortic valve is tricuspid. Aortic valve regurgitation is mild.  Mild to moderate aortic valve sclerosis/calcification is present, without  any evidence of aortic stenosis. Aortic regurgitation PHT measures 517  msec.  6. The inferior vena cava is dilated in size with >50% respiratory  variability, suggesting right atrial pressure of 8 mmHg.   Comparison(s): Changes from prior study are noted. 01/22/2016: LVEF 55-60%,  mild LAE, RVSP 49 mmHg.   LHC (04/07/17): Minimal CAD involving large epicardial coronary arteries. Mild to moderate stenosis of diagonal branches is evident. Normal LV contraction. Normal LV filling pressure. Small right radial artery.   Pharmacologic myocardial perfusion stress test (03/24/17): High risk study with small in size, mild in severity, basal inferoseptal and mid inferoseptal defects that could reflect diaphragmatic attenuation and/or ischemia. Is also a small in size, moderate in severity, partially reversible apical defect. Moderate in size, moderate in severity, basal anteroseptal, mid anteroseptal, and apical septal defect is reversible and concerning for ischemia. LVEF 67%.   Holter monitor (02/18/17): Predominantly sinus rhythm with rare PACs and PVCs. No significant arrhythmias.  EKG:  EKG is personally  reviewed.   08/28/2021: not ordered today 04/09/21: sinus rhythm at 68 bpm  Recent Labs: 01/30/2021: TSH 1.42 04/05/2021: BUN 19; Creatinine, Ser 0.83; Hemoglobin 12.8; Platelets 195; Potassium 3.8; Sodium 140   Recent Lipid Panel    Component Value Date/Time   CHOL 132 01/25/2015 1112   TRIG 64 01/25/2015 1112   HDL 68 01/25/2015 1112   CHOLHDL 1.9 01/25/2015 1112   VLDL 13 01/25/2015 1112   LDLCALC 51 01/25/2015 1112   LDLDIRECT 156.3 03/28/2011 1038    Physical Exam:    VS:  BP (!) 160/80 (BP Location: Left Arm, Patient Position: Sitting, Cuff Size: Normal)   Pulse 60   Ht '4\' 11"'  (1.499 m)   Wt 109 lb 6.4 oz (49.6 kg)   SpO2 99%   BMI 22.10 kg/m     Wt Readings from Last 3 Encounters:  08/28/21 109 lb 6.4 oz (49.6 kg)  08/22/21 108 lb 9.6 oz (49.3 kg)  08/21/21 109 lb (49.4 kg)    GEN: Well nourished, well developed in no acute distress HEENT: Normal, moist mucous membranes NECK: No JVD CARDIAC: regular rhythm, normal S1 and S2, no rubs or gallops. No murmur. VASCULAR: Radial and DP pulses 2+ bilaterally. No carotid bruits RESPIRATORY:  Clear to auscultation without rales, wheezing or rhonchi  ABDOMEN: Soft, non-tender, non-distended MUSCULOSKELETAL:  Ambulates independently SKIN: Warm and dry, trivial nonpitting ankle edema NEUROLOGIC:  Alert and oriented x 3. No focal neuro deficits noted. PSYCHIATRIC:  Normal affect    ASSESSMENT:    1. Nonocclusive coronary atherosclerosis of native coronary artery   2. Labile blood pressure   3. Essential hypertension   4. Cardiac risk counseling   5. Counseling on health promotion and disease prevention     PLAN:    nonobstructive CAD  -on cath 2018 -continue aspirin, atorvastatin -counseled on red flag warning signs that need immediate medical attention  Hypertension: labile -on losartan 25 mg BID -will trial amlodipine at different times of day to find the best fit to control bp and minimize low BP -limited  options for additional meds. No beta blocker Stein to history of bradycardia. With intermittent low BP, would like to avoid thiazide/loop/MRA to avoid dehydration/orthostasis. Also has history of kidney stones.  Cardiac risk counseling and prevention recommendations: -recommend heart healthy/Mediterranean diet, with whole grains, fruits, vegetable, fish, lean meats, nuts, and olive oil. Limit salt. -recommend moderate walking, 3-5 times/week for 30-50 minutes each session. Aim for at least 150 minutes.week. Goal should be pace of 3 miles/hours, or walking 1.5 miles in 30 minutes -  recommend avoidance of tobacco products. Avoid excess alcohol. -on aspirin 81 mg daily per personal preference  Plan for follow up: 1 year or sooner as needed.  Medication Adjustments/Labs and Tests Ordered: Current medicines are reviewed at length with the patient today.  Concerns regarding medicines are outlined above.  No orders of the defined types were placed in this encounter.  No orders of the defined types were placed in this encounter.  Patient Instructions  Medication Instructions:  Try taking amlodipine in the morning for 1-2 weeks, then switch to the lunch time/afternoon to see which works best for better blood pressure control.   *If you need a refill on your cardiac medications before your next appointment, please call your pharmacy*   Lab Work: None ordered today   Testing/Procedures: None ordered today   Follow-Up: At [facility], you and your health needs are our priority.  As part of our continuing mission to provide you with exceptional heart care, we have created designated Provider Care Teams.  These Care Teams include your primary Cardiologist (physician) and Advanced Practice Providers (APPs -  Physician Assistants and Nurse Practitioners) who all work together to provide you with the care you need, when you need it.  We recommend signing up for the patient portal called "MyChart".   Sign up information is provided on this After Visit Summary.  MyChart is used to connect with patients for Virtual Visits (Telemedicine).  Patients are able to view lab/test results, encounter notes, upcoming appointments, etc.  Non-urgent messages can be sent to your provider as well.   To learn more about what you can do with MyChart, go to [email or web address].    Your next appointment:   1 year(s)  The format for your next appointment:   In Person  Provider:   Buford Dresser, MD      [facility],acting as a scribe for Buford Dresser, MD.,have documented all relevant documentation on the behalf of Buford Dresser, MD,as directed by  Buford Dresser, MD while in the presence of Buford Dresser, MD.  I, Buford Dresser, MD, have reviewed all documentation for this visit. The documentation on 09/30/21 for the exam, diagnosis, procedures, and orders are all accurate and complete.   Signed, Buford Dresser, MD PhD 08/28/2021    [place]

## 2021-09-05 DIAGNOSIS — Z1231 Encounter for screening mammogram for malignant neoplasm of breast: Secondary | ICD-10-CM | POA: Diagnosis not present

## 2021-09-05 LAB — HM MAMMOGRAPHY

## 2021-09-06 ENCOUNTER — Encounter: Payer: Self-pay | Admitting: Internal Medicine

## 2021-09-10 ENCOUNTER — Encounter: Payer: Self-pay | Admitting: Internal Medicine

## 2021-09-10 DIAGNOSIS — L821 Other seborrheic keratosis: Secondary | ICD-10-CM | POA: Diagnosis not present

## 2021-09-10 DIAGNOSIS — L218 Other seborrheic dermatitis: Secondary | ICD-10-CM | POA: Diagnosis not present

## 2021-09-10 DIAGNOSIS — D1801 Hemangioma of skin and subcutaneous tissue: Secondary | ICD-10-CM | POA: Diagnosis not present

## 2021-09-10 DIAGNOSIS — Z85828 Personal history of other malignant neoplasm of skin: Secondary | ICD-10-CM | POA: Diagnosis not present

## 2021-09-10 DIAGNOSIS — L718 Other rosacea: Secondary | ICD-10-CM | POA: Diagnosis not present

## 2021-09-10 DIAGNOSIS — D2239 Melanocytic nevi of other parts of face: Secondary | ICD-10-CM | POA: Diagnosis not present

## 2021-09-10 DIAGNOSIS — D225 Melanocytic nevi of trunk: Secondary | ICD-10-CM | POA: Diagnosis not present

## 2021-09-10 DIAGNOSIS — L738 Other specified follicular disorders: Secondary | ICD-10-CM | POA: Diagnosis not present

## 2021-09-12 ENCOUNTER — Ambulatory Visit: Payer: Medicare Other | Admitting: Internal Medicine

## 2021-10-03 ENCOUNTER — Ambulatory Visit: Payer: Medicare Other | Admitting: Internal Medicine

## 2021-10-15 DIAGNOSIS — M810 Age-related osteoporosis without current pathological fracture: Secondary | ICD-10-CM | POA: Diagnosis not present

## 2021-10-18 DIAGNOSIS — H5021 Vertical strabismus, right eye: Secondary | ICD-10-CM | POA: Diagnosis not present

## 2021-10-18 DIAGNOSIS — H5034 Intermittent alternating exotropia: Secondary | ICD-10-CM | POA: Diagnosis not present

## 2021-10-18 DIAGNOSIS — Z961 Presence of intraocular lens: Secondary | ICD-10-CM | POA: Diagnosis not present

## 2021-10-19 DIAGNOSIS — Z23 Encounter for immunization: Secondary | ICD-10-CM | POA: Diagnosis not present

## 2021-10-24 ENCOUNTER — Ambulatory Visit (INDEPENDENT_AMBULATORY_CARE_PROVIDER_SITE_OTHER): Payer: Medicare Other | Admitting: Internal Medicine

## 2021-10-24 ENCOUNTER — Encounter: Payer: Self-pay | Admitting: Internal Medicine

## 2021-10-24 ENCOUNTER — Other Ambulatory Visit: Payer: Self-pay

## 2021-10-24 DIAGNOSIS — I10 Essential (primary) hypertension: Secondary | ICD-10-CM | POA: Diagnosis not present

## 2021-10-24 DIAGNOSIS — I2510 Atherosclerotic heart disease of native coronary artery without angina pectoris: Secondary | ICD-10-CM | POA: Diagnosis not present

## 2021-10-24 DIAGNOSIS — I4891 Unspecified atrial fibrillation: Secondary | ICD-10-CM

## 2021-10-24 DIAGNOSIS — K5901 Slow transit constipation: Secondary | ICD-10-CM

## 2021-10-24 DIAGNOSIS — F411 Generalized anxiety disorder: Secondary | ICD-10-CM

## 2021-10-24 MED ORDER — DIAZEPAM 2 MG PO TABS: 2.0000 mg | ORAL_TABLET | Freq: Two times a day (BID) | ORAL | 2 refills | Status: DC | PRN

## 2021-10-24 MED ORDER — LOSARTAN POTASSIUM 50 MG PO TABS: ORAL_TABLET | ORAL | 3 refills | Status: DC

## 2021-10-24 MED ORDER — IPRATROPIUM BROMIDE 0.06 % NA SOLN: 2.0000 | Freq: Three times a day (TID) | NASAL | 2 refills | Status: DC

## 2021-10-24 NOTE — Progress Notes (Signed)
Subjective:  Patient ID: [name], female    DOB: 1940/11/30  Age: 80 y.o. MRN: [id number]  CC: Follow-up (6 week f/u)   HPI [name] presents for constipation, osteoporosis, back pain, hypertension.  Overall doing well.  The patient had a reaction to flu shot.  She remains constipated  Outpatient Medications Prior to Visit  Medication Sig Dispense Refill   acetaminophen (TYLENOL) 500 MG tablet Take 500 mg by mouth every 4 (four) hours as needed.     amLODipine (NORVASC) 2.5 MG tablet Take 1 tablet (2.5 mg total) by mouth at bedtime. (Patient taking differently: Take 2.5 mg by mouth daily. 1/2 tablet a day) 90 tablet 3   b complex vitamins capsule Take 1 capsule by mouth daily.     Calcium-Magnesium-Vitamin D 600-40-500 MG-MG-UNIT TB24 Take 1 tablet by mouth daily at 6 PM.     Cholecalciferol (VITAMIN D) 50 MCG (2000 UT) CAPS Take 1 capsule by mouth daily.     denosumab (PROLIA) 60 MG/ML SOSY injection Inject 60 mg into the skin every 6 (six) months.     polyethylene glycol (MIRALAX) 17 g packet Take 17 g by mouth every other day.     losartan (COZAAR) 50 MG tablet Take 25 mg by mouth 2 (two) times daily.     diazepam (VALIUM) 5 MG tablet Take 1 tablet (5 mg total) by mouth every 12 (twelve) hours as needed for anxiety or muscle spasms. (Patient not taking: Reported on 10/24/2021) 60 tablet 3   magnesium oxide (MAG-OX) 400 MG tablet Take 400 mg by mouth daily. (Patient not taking: Reported on 10/24/2021)     sennosides-docusate sodium (SENOKOT-S) 8.6-50 MG tablet Take 1 tablet by mouth in the morning and at bedtime. (Patient not taking: Reported on 10/24/2021) 100 tablet 5   No facility-administered medications prior to visit.    ROS: Review of Systems  Constitutional:  Positive for fatigue. Negative for activity change, appetite change, chills and unexpected weight change.  HENT:  Negative for congestion, mouth sores and sinus pressure.   Eyes:  Negative for visual  disturbance.  Respiratory:  Negative for cough and chest tightness.   Gastrointestinal:  Positive for constipation. Negative for abdominal distention, abdominal pain and nausea.  Genitourinary:  Negative for difficulty urinating, frequency and vaginal pain.  Musculoskeletal:  Negative for back pain and gait problem.  Skin:  Negative for pallor and rash.  Neurological:  Positive for tremors and weakness. Negative for dizziness, numbness and headaches.  Psychiatric/Behavioral:  Negative for confusion and sleep disturbance. The patient is nervous/anxious.    Objective:  BP (!) 162/80 (BP Location: Left Arm)   Pulse 70   Temp 98.5 F (36.9 C) (Oral)   Ht 4\' 11"  (1.499 m)   Wt 108 lb 12.8 oz (49.4 kg)   SpO2 95%   BMI 21.97 kg/m   BP Readings from Last 3 Encounters:  10/24/21 (!) 162/80  08/28/21 (!) 160/80  08/22/21 110/60    Wt Readings from Last 3 Encounters:  10/24/21 108 lb 12.8 oz (49.4 kg)  08/28/21 109 lb 6.4 oz (49.6 kg)  08/22/21 108 lb 9.6 oz (49.3 kg)    Physical Exam Constitutional:      General: She is not in acute distress.    Appearance: She is well-developed.  HENT:     Head: Normocephalic.     Right Ear: External ear normal.     Left Ear: External ear normal.     Nose:  Nose normal.  Eyes:     General:        Right eye: No discharge.        Left eye: No discharge.     Conjunctiva/sclera: Conjunctivae normal.     Pupils: Pupils are equal, round, and reactive to light.  Neck:     Thyroid: No thyromegaly.     Vascular: No JVD.     Trachea: No tracheal deviation.  Cardiovascular:     Rate and Rhythm: Normal rate and regular rhythm.     Heart sounds: Normal heart sounds.  Pulmonary:     Effort: No respiratory distress.     Breath sounds: No stridor. No wheezing.  Abdominal:     General: Bowel sounds are normal. There is no distension.     Palpations: Abdomen is soft. There is no mass.     Tenderness: There is no abdominal tenderness. There is no  guarding or rebound.  Musculoskeletal:        General: No tenderness.     Cervical back: Normal range of motion and neck supple. No rigidity.  Lymphadenopathy:     Cervical: No cervical adenopathy.  Skin:    Findings: No erythema or rash.  Neurological:     Mental Status: She is oriented to person, place, and time.     Cranial Nerves: No cranial nerve deficit.     Motor: No abnormal muscle tone.     Coordination: Coordination normal.     Gait: Gait abnormal.     Deep Tendon Reflexes: Reflexes normal.  Psychiatric:        Behavior: Behavior normal.        Thought Content: Thought content normal.        Judgment: Judgment normal.  Slight tremor, ataxia  Lab Results  Component Value Date   WBC 7.0 04/05/2021   HGB 12.8 04/05/2021   HCT 38.3 04/05/2021   PLT 195 04/05/2021   GLUCOSE 92 04/05/2021   CHOL 132 01/25/2015   TRIG 64 01/25/2015   HDL 68 01/25/2015   LDLDIRECT 156.3 03/28/2011   LDLCALC 51 01/25/2015   ALT 18 12/18/2017   AST 29 12/18/2017   NA 140 04/05/2021   K 3.8 04/05/2021   CL 104 04/05/2021   CREATININE 0.83 04/05/2021   BUN 19 04/05/2021   CO2 27 04/05/2021   TSH 1.42 01/30/2021   INR 1.0 04/01/2017    DG Abd 2 Views  Result Date: 08/22/2021 CLINICAL DATA:  History of constipation.  Chronic abdominal pain. EXAM: ABDOMEN - 2 VIEW COMPARISON:  12/21/2020.  CT 12/15/2020. FINDINGS: Soft tissue structures are unremarkable. Left nephrolithiasis again noted. Pelvic calcifications consistent phleboliths. Large amount of stool noted throughout the colon. No bowel distention or free air. Diffuse osteopenia. Thoracolumbar scoliosis concave left. Severe degenerative changes lumbar spine and both hips. Corticated bony density noted adjacent to the ileum, most likely old injury. Surgical clips and sutures noted the pelvis. IMPRESSION: 1.  Left nephrolithiasis again noted. 2. Large amount of stool noted throughout the colon. No bowel distention or free air.  Electronically Signed   By: Marcello Moores  Register M.D.   On: 08/22/2021 11:01    Assessment & Plan:   Problem List Items Addressed This Visit     Essential hypertension (Chronic)    Not well controlled.  Continue with losartan and amlodipine at low-dose Increase losartan slowly as instructed if tolerated      Relevant Medications   losartan (COZAAR) 50 MG tablet  Anxiety disorder    Low-dose diazepam as needed  Potential benefits of a long term benzodiazepines  use as well as potential risks  and complications were explained to the patient and were aknowledged.      Relevant Medications   diazepam (VALIUM) 2 MG tablet   Atrial fibrillation with rapid ventricular response (HCC)    Doing well      Relevant Medications   losartan (COZAAR) 50 MG tablet   Constipation    Proceed with GI recommendations, purge with MiraLAX, then start Linzess as she was instructed      Hypertension, uncontrolled    Continue with losartan bid, amlodipine      Relevant Medications   losartan (COZAAR) 50 MG tablet      Meds ordered this encounter  Medications   losartan (COZAAR) 50 MG tablet    Sig: Take 50 mg at hs and 25 mg am    Dispense:  90 tablet    Refill:  3   ipratropium (ATROVENT) 0.06 % nasal spray    Sig: Place 2 sprays into the nose 3 (three) times daily.    Dispense:  15 mL    Refill:  2   diazepam (VALIUM) 2 MG tablet    Sig: Take 1 tablet (2 mg total) by mouth 2 (two) times daily as needed for anxiety.    Dispense:  60 tablet    Refill:  2      Follow-up: Return in about 3 months (around 01/22/2022).  Walker Kehr, MD

## 2021-10-24 NOTE — Patient Instructions (Addendum)
Shoes:  Hoka bondi  NB [employer] inserts  [employer]

## 2021-10-24 NOTE — Progress Notes (Signed)
Subjective:  Patient ID: [name], female    DOB: [birth year]  Age: 80 y.o. MRN: [id number]  CC: Follow-up (6 week f/u)   HPI [name] presents for HTN, back pain  Outpatient Medications Prior to Visit  Medication Sig Dispense Refill   acetaminophen (TYLENOL) 500 MG tablet Take 500 mg by mouth every 4 (four) hours as needed.     amLODipine (NORVASC) 2.5 MG tablet Take 1 tablet (2.5 mg total) by mouth at bedtime. (Patient taking differently: Take 2.5 mg by mouth daily. 1/2 tablet a day) 90 tablet 3   b complex vitamins capsule Take 1 capsule by mouth daily.     Calcium-Magnesium-Vitamin D 600-40-500 MG-MG-UNIT TB24 Take 1 tablet by mouth daily at 6 PM.     Cholecalciferol (VITAMIN D) 50 MCG (2000 UT) CAPS Take 1 capsule by mouth daily.     denosumab (PROLIA) 60 MG/ML SOSY injection Inject 60 mg into the skin every 6 (six) months.     polyethylene glycol (MIRALAX) 17 g packet Take 17 g by mouth every other day.     losartan (COZAAR) 50 MG tablet Take 25 mg by mouth 2 (two) times daily.     diazepam (VALIUM) 5 MG tablet Take 1 tablet (5 mg total) by mouth every 12 (twelve) hours as needed for anxiety or muscle spasms. (Patient not taking: Reported on 10/24/2021) 60 tablet 3   magnesium oxide (MAG-OX) 400 MG tablet Take 400 mg by mouth daily. (Patient not taking: Reported on 10/24/2021)     sennosides-docusate sodium (SENOKOT-S) 8.6-50 MG tablet Take 1 tablet by mouth in the morning and at bedtime. (Patient not taking: Reported on 10/24/2021) 100 tablet 5   No facility-administered medications prior to visit.    ROS: Review of Systems  Constitutional:  Negative for activity change, appetite change, chills, fatigue and unexpected weight change.  HENT:  Negative for congestion, mouth sores and sinus pressure.   Eyes:  Negative for visual disturbance.  Respiratory:  Negative for cough and chest tightness.   Gastrointestinal:  Positive for constipation. Negative for abdominal pain  and nausea.  Genitourinary:  Negative for difficulty urinating, frequency and vaginal pain.  Musculoskeletal:  Negative for back pain and gait problem.  Skin:  Negative for pallor and rash.  Neurological:  Negative for dizziness, tremors, weakness, numbness and headaches.  Psychiatric/Behavioral:  Negative for confusion, sleep disturbance and suicidal ideas. The patient is nervous/anxious.    Objective:  BP (!) 162/80 (BP Location: Left Arm)   Pulse 70   Temp 98.5 F (36.9 C) (Oral)   Ht 4\' 11"  (1.499 m)   Wt 108 lb 12.8 oz (49.4 kg)   SpO2 95%   BMI 21.97 kg/m   BP Readings from Last 3 Encounters:  10/24/21 (!) 162/80  08/28/21 (!) 160/80  08/22/21 110/60    Wt Readings from Last 3 Encounters:  10/24/21 108 lb 12.8 oz (49.4 kg)  08/28/21 109 lb 6.4 oz (49.6 kg)  08/22/21 108 lb 9.6 oz (49.3 kg)    Physical Exam Constitutional:      General: She is not in acute distress.    Appearance: She is well-developed.  HENT:     Head: Normocephalic.     Right Ear: External ear normal.     Left Ear: External ear normal.     Nose: Nose normal.  Eyes:     General:        Right eye: No discharge.  Left eye: No discharge.     Conjunctiva/sclera: Conjunctivae normal.     Pupils: Pupils are equal, round, and reactive to light.  Neck:     Thyroid: No thyromegaly.     Vascular: No JVD.     Trachea: No tracheal deviation.  Cardiovascular:     Rate and Rhythm: Normal rate and regular rhythm.     Heart sounds: Normal heart sounds.  Pulmonary:     Effort: No respiratory distress.     Breath sounds: No stridor. No wheezing.  Abdominal:     General: Bowel sounds are normal. There is no distension.     Palpations: Abdomen is soft. There is no mass.     Tenderness: There is no abdominal tenderness. There is no guarding or rebound.  Musculoskeletal:        General: No tenderness.     Cervical back: Normal range of motion and neck supple. No rigidity.  Lymphadenopathy:      Cervical: No cervical adenopathy.  Skin:    Findings: No erythema or rash.  Neurological:     Mental Status: She is oriented to person, place, and time.     Cranial Nerves: No cranial nerve deficit.     Motor: No abnormal muscle tone.     Coordination: Coordination normal.     Deep Tendon Reflexes: Reflexes normal.  Psychiatric:        Behavior: Behavior normal.        Thought Content: Thought content normal.        Judgment: Judgment normal.    Lab Results  Component Value Date   WBC 7.0 04/05/2021   HGB 12.8 04/05/2021   HCT 38.3 04/05/2021   PLT 195 04/05/2021   GLUCOSE 92 04/05/2021   CHOL 132 01/25/2015   TRIG 64 01/25/2015   HDL 68 01/25/2015   LDLDIRECT 156.3 03/28/2011   LDLCALC 51 01/25/2015   ALT 18 12/18/2017   AST 29 12/18/2017   NA 140 04/05/2021   K 3.8 04/05/2021   CL 104 04/05/2021   CREATININE 0.83 04/05/2021   BUN 19 04/05/2021   CO2 27 04/05/2021   TSH 1.42 01/30/2021   INR 1.0 04/01/2017    DG Abd 2 Views  Result Date: 08/22/2021 CLINICAL DATA:  History of constipation.  Chronic abdominal pain. EXAM: ABDOMEN - 2 VIEW COMPARISON:  12/21/2020.  CT 12/15/2020. FINDINGS: Soft tissue structures are unremarkable. Left nephrolithiasis again noted. Pelvic calcifications consistent phleboliths. Large amount of stool noted throughout the colon. No bowel distention or free air. Diffuse osteopenia. Thoracolumbar scoliosis concave left. Severe degenerative changes lumbar spine and both hips. Corticated bony density noted adjacent to the ileum, most likely old injury. Surgical clips and sutures noted the pelvis. IMPRESSION: 1.  Left nephrolithiasis again noted. 2. Large amount of stool noted throughout the colon. No bowel distention or free air. Electronically Signed   By: Marcello Moores  Register M.D.   On: 08/22/2021 11:01    Assessment & Plan:   Problem List Items Addressed This Visit     Essential hypertension (Chronic)   Relevant Medications   losartan (COZAAR) 50  MG tablet   Atrial fibrillation with rapid ventricular response (HCC)    Doing well      Relevant Medications   losartan (COZAAR) 50 MG tablet   Hypertension, uncontrolled    Continue with losartan bid, amlodipine      Relevant Medications   losartan (COZAAR) 50 MG tablet      Meds ordered this  encounter  Medications   losartan (COZAAR) 50 MG tablet    Sig: Take 50 mg at hs and 25 mg am    Dispense:  90 tablet    Refill:  3   ipratropium (ATROVENT) 0.06 % nasal spray    Sig: Place 2 sprays into the nose 3 (three) times daily.    Dispense:  15 mL    Refill:  2   diazepam (VALIUM) 2 MG tablet    Sig: Take 1 tablet (2 mg total) by mouth 2 (two) times daily as needed for anxiety.    Dispense:  60 tablet    Refill:  2      Follow-up: Return in about 4 months (around 02/22/2022).  Walker Kehr, MD

## 2021-10-24 NOTE — Assessment & Plan Note (Signed)
Doing well 

## 2021-10-24 NOTE — Assessment & Plan Note (Signed)
Continue with losartan bid, amlodipine

## 2021-10-25 NOTE — Assessment & Plan Note (Signed)
Low-dose diazepam as needed  Potential benefits of a long term benzodiazepines  use as well as potential risks  and complications were explained to the patient and were aknowledged.

## 2021-10-25 NOTE — Assessment & Plan Note (Signed)
Proceed with GI recommendations, purge with MiraLAX, then start Linzess as she was instructed

## 2021-10-25 NOTE — Assessment & Plan Note (Signed)
Not well controlled.  Continue with losartan and amlodipine at low-dose Increase losartan slowly as instructed if tolerated

## 2021-12-27 DIAGNOSIS — H5111 Convergence insufficiency: Secondary | ICD-10-CM | POA: Diagnosis not present

## 2021-12-27 DIAGNOSIS — H5022 Vertical strabismus, left eye: Secondary | ICD-10-CM | POA: Diagnosis not present

## 2022-01-04 ENCOUNTER — Ambulatory Visit (INDEPENDENT_AMBULATORY_CARE_PROVIDER_SITE_OTHER): Payer: Medicare Other | Admitting: Physician Assistant

## 2022-01-04 ENCOUNTER — Ambulatory Visit (INDEPENDENT_AMBULATORY_CARE_PROVIDER_SITE_OTHER)
Admission: RE | Admit: 2022-01-04 | Discharge: 2022-01-04 | Disposition: A | Discharge status: Home / Self Care | Payer: Medicare Other | Source: Ambulatory Visit | Attending: Physician Assistant | Admitting: Physician Assistant

## 2022-01-04 ENCOUNTER — Encounter: Payer: Self-pay | Admitting: Physician Assistant

## 2022-01-04 ENCOUNTER — Other Ambulatory Visit: Payer: Self-pay

## 2022-01-04 VITALS — BP 118/62 | HR 61 | Ht 59.0 in | Wt 108.2 lb

## 2022-01-04 DIAGNOSIS — K5900 Constipation, unspecified: Secondary | ICD-10-CM

## 2022-01-04 DIAGNOSIS — R110 Nausea: Secondary | ICD-10-CM | POA: Diagnosis not present

## 2022-01-04 NOTE — Patient Instructions (Signed)
Your provider has requested that you have an abdominal x ray before leaving today. Please go to the basement floor to our Radiology department for the test.   If you are age 82 or older, your body mass index should be between 23-30. Your Body mass index is 21.85 kg/m. If this is out of the aforementioned range listed, please consider follow up with your Primary Care Provider.  If you are age 72 or younger, your body mass index should be between 19-25. Your Body mass index is 21.85 kg/m. If this is out of the aformentioned range listed, please consider follow up with your Primary Care Provider.   ________________________________________________________  The [place] [facility] providers would like to encourage you to use [facility] to communicate with providers for non-urgent requests or questions.  Due to long hold times on the telephone, sending your provider a message by [facility] may be a faster and more efficient way to get a response.  Please allow 48 business hours for a response.  Please remember that this is for non-urgent requests.  _______________________________________________________

## 2022-01-04 NOTE — Progress Notes (Signed)
Chief Complaint: Abdominal pain, diarrhea and constipation  HPI:    [name] is an 81 year old female with a past medical history as listed below including diverticulosis, osteoporosis and multiple others, known to Dr. Silverio Decamp for constipation, who returns to clinic today for follow-up of her abdominal pain, diarrhea and constipation.    01/2016 colonoscopy done for history of large tubulovillous adenoma in the cecum status post endoscopic removal extending into the appendiceal orifice with incomplete removal status post extended appendectomy and partial septectomy.    05/29/2018 colonoscopy was severe sigmoid diverticulosis and internal hemorrhoids and otherwise normal.  Negative for microscopic colitis.    08/10/2018 patient seen by Dr. Silverio Decamp and had started Zenpep and Colestid and was doing some better.    08/21/2021 patient seen in clinic for constipation.  At that time ordered 2 view abdominal x-ray and recommended patient start Linzess 72 mcg daily.    08/21/2021 abdominal x-ray with left nephrolithiasis and a large amount of stool throughout the colon.  She was told to do a bowel purge with MiraLAX and then start the Linzess 72 mcg daily.    Today, the patient tells me that she never did anything that we talked about at last visit.  Patient was worried given her urgency and lack of control of her bowel movements to ever do a bowel purge and so she never started the [place].  She has been using MiraLAX as needed but really stopped doing this even 2 months ago because she was getting liquid stool.  Tells me that at this point she has not had a bowel movement in 8 days and does not feel like she has passed any gas either, prior to this was having some harder stool with water behind it, even tried an enema about a week ago but tells me she only got some hard pebbles out and then a lot of water.  Describes generalized abdominal discomfort at this point and nausea after eating anything.  She has still  been able to eat though with no vomiting.    Denies fever, chills or blood in her stool.    Past Medical History:  Diagnosis Date   Abdominal pain, unspecified site    ADVERSE REACTION TO MEDICATION 07/31/2009   ANEMIA-NOS 07/07/2007   Arthritis    Chest pain, unspecified    Chronic kidney disease    hx of kidney stone   COLONIC POLYPS, ADENOMATOUS, HX OF    Complication of anesthesia    Disturbance of skin sensation    DIVERTICULOSIS, COLON 02/23/2009   Family history of diabetes mellitus    Family history of malignant neoplasm of breast    FATIGUE 05/25/2009   Fever, unspecified    Heart murmur    hx of   HYPERLIPIDEMIA 07/07/2007   HYPERPARATHYROIDISM UNSPECIFIED 10/20/2007   HYPERTENSION 02/04/2008   Irritable bowel syndrome (IBS)    NEPHROLITHIASIS, HX OF 11/15/2008   OSTEOPOROSIS 01/01/2008   Other acquired absence of organ    parathyroidectomy   Pain in limb    PALPITATIONS, OCCASIONAL 07/12/2008   PARATHYROIDECTOMY 01/02/2009   PONV (postoperative nausea and vomiting)    SCOLIOSIS 05/25/2009   Seasonal allergies    Unspecified constipation    URINARY INCONTINENCE 07/07/2007   UTI'S, RECURRENT 11/20/2009   kimbrough     Past Surgical History:  Procedure Laterality Date   BUNIONECTOMY     CATARACT EXTRACTION, BILATERAL     COLONOSCOPY     EXTRACORPOREAL SHOCK WAVE LITHOTRIPSY Left  12/21/2020   Procedure: EXTRACORPOREAL SHOCK WAVE LITHOTRIPSY (ESWL);  Surgeon: Festus Aloe, MD;  Location: [facility];  Service: Urology;  Laterality: Left;   LAPAROSCOPIC APPENDECTOMY  01/19/2016   Procedure: APPENDECTOMY LAPAROSCOPIC with orifice of cecal polyp;  Surgeon: Leighton Ruff, MD;  Location: [facility];  Service: General;;   LEFT HEART CATH AND CORONARY ANGIOGRAPHY N/A 04/07/2017   Procedure: Left Heart Cath and Coronary Angiography;  Surgeon: Nelva Bush, MD;  Location: [place] [facility];  Service: Cardiovascular;  Laterality: N/A;   PARATHYROIDECTOMY     Rt  [place]ior open neck exploration   POLYPECTOMY     RIGHT OOPHORECTOMY     TONSILLECTOMY      Current Outpatient Medications  Medication Sig Dispense Refill   acetaminophen (TYLENOL) 500 MG tablet Take 500 mg by mouth every 4 (four) hours as needed.     amLODipine (NORVASC) 2.5 MG tablet Take 1 tablet (2.5 mg total) by mouth at bedtime. (Patient taking differently: Take 2.5 mg by mouth daily. 1/2 tablet a day) 90 tablet 3   b complex vitamins capsule Take 1 capsule by mouth daily.     Calcium-Magnesium-Vitamin D 600-40-500 MG-MG-UNIT TB24 Take 1 tablet by mouth daily at 6 PM.     Cholecalciferol (VITAMIN D) 50 MCG (2000 UT) CAPS Take 1 capsule by mouth daily.     denosumab (PROLIA) 60 MG/ML SOSY injection Inject 60 mg into the skin every 6 (six) months.     diazepam (VALIUM) 2 MG tablet Take 1 tablet (2 mg total) by mouth 2 (two) times daily as needed for anxiety. 60 tablet 2   ipratropium (ATROVENT) 0.06 % nasal spray Place 2 sprays into the nose 3 (three) times daily. 15 mL 2   losartan (COZAAR) 50 MG tablet Take 50 mg at hs and 25 mg am 90 tablet 3   Omega-3 1000 MG CAPS Take by mouth.     No current facility-administered medications for this visit.    Allergies as of 01/04/2022 - Review Complete 01/04/2022  Allergen Reaction Noted   Anesthetics, amide Other (See Comments) 02/20/2016   Lisinopril Cough 10/05/2009    Family History  Problem Relation Age of Onset   Heart disease Mother        valve replacement   Sudden death Father 55   Heart disease Father 66       MI   Breast cancer Paternal Aunt    Diabetes Other        1st degree relative   Colon cancer Neg Hx    Stomach cancer Neg Hx    Esophageal cancer Neg Hx    Pancreatic cancer Neg Hx     Social History   Socioeconomic History   Marital status: Widowed    Spouse name: Not on file   Number of children: 3   Years of education: Not on file   Highest education level: Not on file  Occupational History    Occupation: travel agent    Employer: RETIRED  Tobacco Use   Smoking status: Never   Smokeless tobacco: Never  Vaping Use   Vaping Use: Never used  Substance and Sexual Activity   Alcohol use: Yes    Comment: 1 glass of wine per day   Drug use: No   Sexual activity: Never  Other Topics Concern   Not on file  Social History Narrative       2-3 c coffee in am    ocass wine  G3P2vaginal delivery   Pt cell [phone]      Patient walks daily for exercise.    Social Determinants of Health   Financial Resource Strain: Not on file  Food Insecurity: Not on file  Transportation Needs: Not on file  Physical Activity: Not on file  Stress: Not on file  Social Connections: Not on file  Intimate Partner Violence: Not on file    Review of Systems:    Constitutional: No weight loss, fever or chills Cardiovascular: No chest pain  Respiratory: No SOB Gastrointestinal: See HPI and otherwise negative   Physical Exam:  Vital signs: BP 118/62    Pulse 61    Ht 4\' 11"  (1.499 m)    Wt 108 lb 3.2 oz (49.1 kg)    SpO2 98%    BMI 21.85 kg/m    Constitutional:   Pleasant Elderly, thin appearing, Caucasian female appears to be in NAD, Well developed, Well nourished, alert and cooperative  Respiratory: Respirations even and unlabored. Lungs clear to auscultation bilaterally.   No wheezes, crackles, or rhonchi.  Cardiovascular: Normal S1, S2. No MRG. Regular rate and rhythm. No peripheral edema, cyanosis or pallor.  Gastrointestinal:  Soft, mild distension, mild generalized ttp, No rebound or guarding.  Increased bowel sounds all 4 quadrants. No appreciable masses or hepatomegaly. Psychiatric: Demonstrates good judgement and reason without abnormal affect or behaviors.  No recent labs or imaging.  Assessment: 1.  Constipation: Continues with problems with constipation, never completed recommendations from last visit, history of large adenoma from the cecum and surgery for this which is likely  contributing  Plan: 1.  With description of no gas or bowel movements over the past 8 days I have concern over obstruction.  Ordered a stat abdominal x-ray for further evaluation.  Discussed with patient that pending results likely would recommend that she go ahead and do her MiraLAX bowel purge and start Linzess as discussed at last visit.  She needs to be on something every day for constipation to avoid this from happening.  Discussed that a lot of the urgency and incontinence that she is having is overflow from her severe constipation and hopefully after getting her cleaned out this will help to resolve this.  She verbalized understanding. 2.  Patient to follow in clinic per recommendations after above.  Briefly discussed possibility of repeat colonoscopy, she would like to avoid this if possible.  Ellouise Newer, PA-C [facility] 01/04/2022, 10:58 AM  Cc: Plotnikov, Evie Lacks, MD

## 2022-01-10 MED ORDER — LINACLOTIDE 145 MCG PO CAPS
145.0000 ug | ORAL_CAPSULE | Freq: Every day | ORAL | 2 refills | Status: DC
Start: 2022-01-10 — End: 2022-04-17

## 2022-01-10 NOTE — Telephone Encounter (Signed)
Lets have her increase to Linzess 145 daily.  It should be working by now.  She can always do another half of a MiraLAX purge if she feels full up and the need to do so.  Thanks, JL L   Patient notified of recommendations via my chart.  RX for Linzess 145 mcg sent to requested pharmacy.

## 2022-01-17 DIAGNOSIS — H52203 Unspecified astigmatism, bilateral: Secondary | ICD-10-CM | POA: Diagnosis not present

## 2022-01-17 DIAGNOSIS — Z961 Presence of intraocular lens: Secondary | ICD-10-CM | POA: Diagnosis not present

## 2022-01-17 DIAGNOSIS — H532 Diplopia: Secondary | ICD-10-CM | POA: Diagnosis not present

## 2022-01-21 DIAGNOSIS — K5900 Constipation, unspecified: Secondary | ICD-10-CM

## 2022-01-21 DIAGNOSIS — R110 Nausea: Secondary | ICD-10-CM

## 2022-01-22 NOTE — Telephone Encounter (Signed)
I do not think I have any sooner appointments, keep the March 15 appointment with you and schedule for follow-up with me next available appointment. ?

## 2022-01-28 ENCOUNTER — Other Ambulatory Visit: Payer: Self-pay

## 2022-01-28 ENCOUNTER — Ambulatory Visit (INDEPENDENT_AMBULATORY_CARE_PROVIDER_SITE_OTHER)
Admission: RE | Admit: 2022-01-28 | Discharge: 2022-01-28 | Disposition: A | Discharge status: Home / Self Care | Payer: Medicare Other | Source: Ambulatory Visit | Attending: Physician Assistant | Admitting: Physician Assistant

## 2022-01-28 DIAGNOSIS — R110 Nausea: Secondary | ICD-10-CM | POA: Diagnosis not present

## 2022-01-28 DIAGNOSIS — K5900 Constipation, unspecified: Secondary | ICD-10-CM

## 2022-01-30 ENCOUNTER — Encounter: Payer: Self-pay | Admitting: Physician Assistant

## 2022-01-30 ENCOUNTER — Ambulatory Visit (INDEPENDENT_AMBULATORY_CARE_PROVIDER_SITE_OTHER): Payer: Medicare Other | Admitting: Physician Assistant

## 2022-01-30 VITALS — BP 136/70 | HR 60 | Ht 59.0 in | Wt 109.8 lb

## 2022-01-30 DIAGNOSIS — R110 Nausea: Secondary | ICD-10-CM | POA: Diagnosis not present

## 2022-01-30 DIAGNOSIS — L814 Other melanin hyperpigmentation: Secondary | ICD-10-CM | POA: Diagnosis not present

## 2022-01-30 DIAGNOSIS — L570 Actinic keratosis: Secondary | ICD-10-CM | POA: Diagnosis not present

## 2022-01-30 DIAGNOSIS — L719 Rosacea, unspecified: Secondary | ICD-10-CM | POA: Diagnosis not present

## 2022-01-30 DIAGNOSIS — G8929 Other chronic pain: Secondary | ICD-10-CM

## 2022-01-30 DIAGNOSIS — R1013 Epigastric pain: Secondary | ICD-10-CM | POA: Diagnosis not present

## 2022-01-30 DIAGNOSIS — L219 Seborrheic dermatitis, unspecified: Secondary | ICD-10-CM | POA: Diagnosis not present

## 2022-01-30 DIAGNOSIS — K5909 Other constipation: Secondary | ICD-10-CM

## 2022-01-30 MED ORDER — OMEPRAZOLE MAGNESIUM 20 MG PO TBEC: 20.0000 mg | DELAYED_RELEASE_TABLET | Freq: Every day | ORAL | 0 refills | Status: DC

## 2022-01-30 NOTE — Patient Instructions (Signed)
If you are age 82 or older, your body mass index should be between 23-30. Your Body mass index is 22.18 kg/m?[name] If this is out of the aforementioned range listed, please consider follow up with your Primary Care Provider. ?________________________________________________________ ? ?The [facility] providers would like to encourage you to use [facility] to communicate with providers for non-urgent requests or questions.  Due to long hold times on the telephone, sending your provider a message by [facility] may be a faster and more efficient way to get a response.  Please allow 48 business hours for a response.  Please remember that this is for non-urgent requests.  ?_______________________________________________________ ? ?CONTINUE: Linzess 72 mcg daily ? ?Please purchase the following medications over the counter and take as directed: ? ?START: Omeprazole '20mg'$  daily.  Take 30 - 60 minutes before breakfast meal each day for 14 days. ? ?Thank you for entrusting me with your care and choosing [facility]. ? ?Ellouise Newer, PA-C ? ?

## 2022-01-30 NOTE — Progress Notes (Signed)
? ?Chief Complaint: Follow-up constipation ? ?HPI: ?   [name] is an 81 year old female with a past medical history as listed below, known to Dr. Silverio Decamp, who returns to clinic today for follow-up of her constipation.   ?   01/2016 colonoscopy done for history of large tubulovillous adenoma in the cecum status post endoscopic removal extending into the appendiceal orifice with incomplete removal status post extended appendectomy and partial septectomy. ?   05/29/2018 colonoscopy was severe sigmoid diverticulosis and internal hemorrhoids and otherwise normal.  Negative for microscopic colitis. ?   08/10/2018 patient seen by Dr. Silverio Decamp and had started Zenpep and Colestid and was doing some better. ?   08/21/2021 patient seen in clinic for constipation.  At that time ordered 2 view abdominal x-ray and recommended patient start Linzess 72 mcg daily. ?   08/21/2021 abdominal x-ray with left nephrolithiasis and a large amount of stool throughout the colon.  She was told to do a bowel purge with MiraLAX and then start the Linzess 72 mcg daily. ?   01/04/2022 patient seen in clinic by me and at that time described that she had never done anything that we talked about at the previous visit as she was worried about urgency and lack of control of her bowel movements she never did a bowel purge and never started the [place].  She had been using MiraLAX which was not really helping.  She had not had a bowel movement in 8 days.  At that time ordered a stat abdominal x-ray for concern over obstruction.  Discussed that pending results we will go ahead and do a MiraLAX bowel purge and start Linzess as we discussed at previous visit. ?   01/10/2022 patient called and described that she had taken the 8 samples of Linzess and had no bowel movement.  She had some abdominal swelling and discomfort.  Her Linzess was increased 245 mcg daily and she was told to do another half of a MiraLAX purge. ?   01/11/2022 patient called back and  described that she had an uncontrollable episode of diarrhea immediately after dinner.  She was told to stay on the Linzess 72 mcg. ?   01/21/2022 patient described an uncontrollable blowout 11 days prior on 2/23 she had continued Linzess 72 mcg daily since then with no movement other than a few pebbles.  She describes some stomach pain and gurgling and nausea with cold sweats and shaking.  She felt "lousy".  Dr. Silverio Decamp was asked for her opinion and did not give any further recommendations.  Another x-ray was ordered to see if she was still constipated. ?   01/28/2022 patient had abdominal x-ray which showed nonobstructive nonspecific bowel gas pattern. ?   Today, the patient tells me that she feels like the Linzess 72 mcg has started to work over the past 3 days, at first she had a hard bowel movement and this has been followed by 2 soft solid bowel movements, 1 a day, the last this morning.  Tells me that even though she is having regular bowel movements over the past 3 to 4 days she still feels quite ill.  If she eats anything she gets somewhat nauseous and develops an epigastric pain and has to just lay down.  Still feels very fatigued. ?   Denies fever, chills, blood in her stool or symptoms that awaken her from sleep. ? ?Past Medical History:  ?Diagnosis Date  ? Abdominal pain, unspecified site   ? ADVERSE REACTION  TO MEDICATION 07/31/2009  ? ANEMIA-NOS 07/07/2007  ? Arthritis   ? Chest pain, unspecified   ? Chronic kidney disease   ? hx of kidney stone  ? COLONIC POLYPS, ADENOMATOUS, HX OF   ? Complication of anesthesia   ? Disturbance of skin sensation   ? DIVERTICULOSIS, COLON 02/23/2009  ? Family history of diabetes mellitus   ? Family history of malignant neoplasm of breast   ? FATIGUE 05/25/2009  ? Fever, unspecified   ? Heart murmur   ? hx of  ? HYPERLIPIDEMIA 07/07/2007  ? HYPERPARATHYROIDISM UNSPECIFIED 10/20/2007  ? HYPERTENSION 02/04/2008  ? Irritable bowel syndrome (IBS)   ? NEPHROLITHIASIS, HX OF 11/15/2008   ? OSTEOPOROSIS 01/01/2008  ? Other acquired absence of organ   ? parathyroidectomy  ? Pain in limb   ? PALPITATIONS, OCCASIONAL 07/12/2008  ? PARATHYROIDECTOMY 01/02/2009  ? PONV (postoperative nausea and vomiting)   ? SCOLIOSIS 05/25/2009  ? Seasonal allergies   ? Unspecified constipation   ? URINARY INCONTINENCE 07/07/2007  ? UTI'S, RECURRENT 11/20/2009  ? kimbrough   ? ? ?Past Surgical History:  ?Procedure Laterality Date  ? BUNIONECTOMY    ? CATARACT EXTRACTION, BILATERAL    ? COLONOSCOPY    ? EXTRACORPOREAL SHOCK WAVE LITHOTRIPSY Left 12/21/2020  ? Procedure: EXTRACORPOREAL SHOCK WAVE LITHOTRIPSY (ESWL);  Surgeon: Festus Aloe, MD;  Location: [facility];  Service: Urology;  Laterality: Left;  ? LAPAROSCOPIC APPENDECTOMY  01/19/2016  ? Procedure: APPENDECTOMY LAPAROSCOPIC with orifice of cecal polyp;  Surgeon: Leighton Ruff, MD;  Location: [facility];  Service: General;;  ? LEFT HEART CATH AND CORONARY ANGIOGRAPHY N/A 04/07/2017  ? Procedure: Left Heart Cath and Coronary Angiography;  Surgeon: Nelva Bush, MD;  Location: [place] [facility];  Service: Cardiovascular;  Laterality: N/A;  ? PARATHYROIDECTOMY    ? Rt [place] open neck exploration  ? POLYPECTOMY    ? RIGHT OOPHORECTOMY    ? TONSILLECTOMY    ? ? ?Current Outpatient Medications  ?Medication Sig Dispense Refill  ? acetaminophen (TYLENOL) 500 MG tablet Take 500 mg by mouth every 4 (four) hours as needed.    ? amLODipine (NORVASC) 2.5 MG tablet Take 1 tablet (2.5 mg total) by mouth at bedtime. (Patient taking differently: Take 2.5 mg by mouth daily. 1/2 tablet a day) 90 tablet 3  ? b complex vitamins capsule Take 1 capsule by mouth daily.    ? Calcium-Magnesium-Vitamin D 600-40-500 MG-MG-UNIT TB24 Take 1 tablet by mouth daily at 6 PM.    ? Cholecalciferol (VITAMIN D) 50 MCG (2000 UT) CAPS Take 1 capsule by mouth daily.    ? denosumab (PROLIA) 60 MG/ML SOSY injection Inject 60 mg into the skin every 6 (six) months.    ? diazepam (VALIUM) 2 MG  tablet Take 1 tablet (2 mg total) by mouth 2 (two) times daily as needed for anxiety. 60 tablet 2  ? ipratropium (ATROVENT) 0.06 % nasal spray Place 2 sprays into the nose 3 (three) times daily. 15 mL 2  ? linaclotide (LINZESS) 145 MCG CAPS capsule Take 1 capsule (145 mcg total) by mouth daily before breakfast. 30 capsule 2  ? losartan (COZAAR) 50 MG tablet Take 50 mg at hs and 25 mg am 90 tablet 3  ? Omega-3 1000 MG CAPS Take by mouth.    ? ?No current facility-administered medications for this visit.  ? ? ?Allergies as of 01/30/2022 - Review Complete 01/04/2022  ?Allergen Reaction Noted  ? Anesthetics, amide Other (See  Comments) 02/20/2016  ? Lisinopril Cough 10/05/2009  ? ? ?Family History  ?Problem Relation Age of Onset  ? Heart disease Mother   ?     valve replacement  ? Sudden death Father 31  ? Heart disease Father 79  ?     MI  ? Breast cancer Paternal Aunt   ? Diabetes Other   ?     1st degree relative  ? Colon cancer Neg Hx   ? Stomach cancer Neg Hx   ? Esophageal cancer Neg Hx   ? Pancreatic cancer Neg Hx   ? ? ?Social History  ? ?Socioeconomic History  ? Marital status: Widowed  ?  Spouse name: Not on file  ? Number of children: 3  ? Years of education: Not on file  ? Highest education level: Not on file  ?Occupational History  ? Occupation: travel agent  ?  Employer: RETIRED  ?Tobacco Use  ? Smoking status: Never  ? Smokeless tobacco: Never  ?Vaping Use  ? Vaping Use: Never used  ?Substance and Sexual Activity  ? Alcohol use: Yes  ?  Comment: 1 glass of wine per day  ? Drug use: No  ? Sexual activity: Never  ?Other Topics Concern  ? Not on file  ?Social History Narrative  ?    ? 2-3 c coffee in am   ? ocass wine   ? G3P2vaginal delivery  ? Pt cell [phone]  ?   ? Patient walks daily for exercise.   ? ?Social Determinants of Health  ? ?Financial Resource Strain: Not on file  ?Food Insecurity: Not on file  ?Transportation Needs: Not on file  ?Physical Activity: Not on file  ?Stress: Not on file  ?Social  Connections: Not on file  ?Intimate Partner Violence: Not on file  ? ? ?Review of Systems:    ?Constitutional: No weight loss, fever or chills ?Cardiovascular: No chest pain ?Respiratory: No SOB  ?Gastrointest

## 2022-01-31 DIAGNOSIS — H532 Diplopia: Secondary | ICD-10-CM | POA: Diagnosis not present

## 2022-01-31 DIAGNOSIS — Z961 Presence of intraocular lens: Secondary | ICD-10-CM | POA: Diagnosis not present

## 2022-02-06 DIAGNOSIS — N200 Calculus of kidney: Secondary | ICD-10-CM | POA: Diagnosis not present

## 2022-02-06 DIAGNOSIS — R350 Frequency of micturition: Secondary | ICD-10-CM | POA: Diagnosis not present

## 2022-02-12 NOTE — Progress Notes (Signed)
Reviewed and agree with documentation and assessment and plan. K. Veena Carl Bleecker , MD   

## 2022-02-18 DIAGNOSIS — H532 Diplopia: Secondary | ICD-10-CM | POA: Diagnosis not present

## 2022-02-19 NOTE — Telephone Encounter (Signed)
Lets increase to the 145 mcg qd- [id number]-JLL  ? ?RX for Linzess 145 mcg was sent to pharmacy on file on 2/23. Patient notified of recommendations via my chart. ?

## 2022-03-06 DIAGNOSIS — D1801 Hemangioma of skin and subcutaneous tissue: Secondary | ICD-10-CM | POA: Diagnosis not present

## 2022-03-06 DIAGNOSIS — L719 Rosacea, unspecified: Secondary | ICD-10-CM | POA: Diagnosis not present

## 2022-03-06 DIAGNOSIS — L219 Seborrheic dermatitis, unspecified: Secondary | ICD-10-CM | POA: Diagnosis not present

## 2022-03-06 DIAGNOSIS — L988 Other specified disorders of the skin and subcutaneous tissue: Secondary | ICD-10-CM | POA: Diagnosis not present

## 2022-03-06 DIAGNOSIS — L570 Actinic keratosis: Secondary | ICD-10-CM | POA: Diagnosis not present

## 2022-03-06 DIAGNOSIS — L814 Other melanin hyperpigmentation: Secondary | ICD-10-CM | POA: Diagnosis not present

## 2022-03-11 ENCOUNTER — Ambulatory Visit (INDEPENDENT_AMBULATORY_CARE_PROVIDER_SITE_OTHER): Payer: Medicare Other

## 2022-03-11 DIAGNOSIS — Z0000 Encounter for general adult medical examination without abnormal findings: Secondary | ICD-10-CM | POA: Diagnosis not present

## 2022-03-11 NOTE — Patient Instructions (Signed)
[name] , ?Thank you for taking time to come for your Medicare Wellness Visit. I appreciate your ongoing commitment to your health goals. Please review the following plan we discussed and let me know if I can assist you in the future.  ? ?Screening recommendations/referrals: ?Colonoscopy: no longer required  ?Mammogram: no longer required  ?Bone Density: 04/27/2021 ?Recommended yearly ophthalmology/optometry visit for glaucoma screening and checkup ?Recommended yearly dental visit for hygiene and checkup ? ?Vaccinations: ?Influenza vaccine: completed  ?Pneumococcal vaccine: completed  ?Tdap vaccine: 01/05/2015 ?Shingles vaccine: completed    ? ?Advanced directives: yes  ? ?Conditions/risks identified: none  ? ?Next appointment: none  ? ? ?Preventive Care 82 Years and Older, Female ?Preventive care refers to lifestyle choices and visits with your health care provider that can promote health and wellness. ?What does preventive care include? ?A yearly physical exam. This is also called an annual well check. ?Dental exams once or twice a year. ?Routine eye exams. Ask your health care provider how often you should have your eyes checked. ?Personal lifestyle choices, including: ?Daily care of your teeth and gums. ?Regular physical activity. ?Eating a healthy diet. ?Avoiding tobacco and drug use. ?Limiting alcohol use. ?Practicing safe sex. ?Taking low-dose aspirin every day. ?Taking vitamin and mineral supplements as recommended by your health care provider. ?What happens during an annual well check? ?The services and screenings done by your health care provider during your annual well check will depend on your age, overall health, lifestyle risk factors, and family history of disease. ?Counseling  ?Your health care provider may ask you questions about your: ?Alcohol use. ?Tobacco use. ?Drug use. ?Emotional well-being. ?Home and relationship well-being. ?Sexual activity. ?Eating habits. ?History of falls. ?Memory and  ability to understand (cognition). ?Work and work Statistician. ?Reproductive health. ?Screening  ?You may have the following tests or measurements: ?Height, weight, and BMI. ?Blood pressure. ?Lipid and cholesterol levels. These may be checked every 5 years, or more frequently if you are over 67 years old. ?Skin check. ?Lung cancer screening. You may have this screening every year starting at age 23 if you have a 30-pack-year history of smoking and currently smoke or have quit within the past 15 years. ?Fecal occult blood test (FOBT) of the stool. You may have this test every year starting at age 9. ?Flexible sigmoidoscopy or colonoscopy. You may have a sigmoidoscopy every 5 years or a colonoscopy every 10 years starting at age 81. ?Hepatitis C blood test. ?Hepatitis B blood test. ?Sexually transmitted disease (STD) testing. ?Diabetes screening. This is done by checking your blood sugar (glucose) after you have not eaten for a while (fasting). You may have this done every 1-3 years. ?Bone density scan. This is done to screen for osteoporosis. You may have this done starting at age 19. ?Mammogram. This may be done every 1-2 years. Talk to your health care provider about how often you should have regular mammograms. ?Talk with your health care provider about your test results, treatment options, and if necessary, the need for more tests. ?Vaccines  ?Your health care provider may recommend certain vaccines, such as: ?Influenza vaccine. This is recommended every year. ?Tetanus, diphtheria, and acellular pertussis (Tdap, Td) vaccine. You may need a Td booster every 10 years. ?Zoster vaccine. You may need this after age 13. ?Pneumococcal 13-valent conjugate (PCV13) vaccine. One dose is recommended after age 55. ?Pneumococcal polysaccharide (PPSV23) vaccine. One dose is recommended after age 37. ?Talk to your health care provider about which screenings and vaccines  you need and how often you need them. ?This information is  not intended to replace advice given to you by your health care provider. Make sure you discuss any questions you have with your health care provider. ?Document Released: 12/01/2015 Document Revised: 07/24/2016 Document Reviewed: 09/05/2015 ?Elsevier Interactive Patient Education ? 2017 [place]. ? ?Fall Prevention in the Home ?Falls can cause injuries. They can happen to people of all ages. There are many things you can do to make your home safe and to help prevent falls. ?What can I do on the outside of my home? ?Regularly fix the edges of walkways and driveways and fix any cracks. ?Remove anything that might make you trip as you walk through a door, such as a raised step or threshold. ?Trim any bushes or trees on the path to your home. ?Use bright outdoor lighting. ?Clear any walking paths of anything that might make someone trip, such as rocks or tools. ?Regularly check to see if handrails are loose or broken. Make sure that both sides of any steps have handrails. ?Any raised decks and porches should have guardrails on the edges. ?Have any leaves, snow, or ice cleared regularly. ?Use sand or salt on walking paths during winter. ?Clean up any spills in your garage right away. This includes oil or grease spills. ?What can I do in the bathroom? ?Use night lights. ?Install grab bars by the toilet and in the tub and shower. Do not use towel bars as grab bars. ?Use non-skid mats or decals in the tub or shower. ?If you need to sit down in the shower, use a plastic, non-slip stool. ?Keep the floor dry. Clean up any water that spills on the floor as soon as it happens. ?Remove soap buildup in the tub or shower regularly. ?Attach bath mats securely with double-sided non-slip rug tape. ?Do not have throw rugs and other things on the floor that can make you trip. ?What can I do in the bedroom? ?Use night lights. ?Make sure that you have a light by your bed that is easy to reach. ?Do not use any sheets or blankets that  are too big for your bed. They should not hang down onto the floor. ?Have a firm chair that has side arms. You can use this for support while you get dressed. ?Do not have throw rugs and other things on the floor that can make you trip. ?What can I do in the kitchen? ?Clean up any spills right away. ?Avoid walking on wet floors. ?Keep items that you use a lot in easy-to-reach places. ?If you need to reach something above you, use a strong step stool that has a grab bar. ?Keep electrical cords out of the way. ?Do not use floor polish or wax that makes floors slippery. If you must use wax, use non-skid floor wax. ?Do not have throw rugs and other things on the floor that can make you trip. ?What can I do with my stairs? ?Do not leave any items on the stairs. ?Make sure that there are handrails on both sides of the stairs and use them. Fix handrails that are broken or loose. Make sure that handrails are as long as the stairways. ?Check any carpeting to make sure that it is firmly attached to the stairs. Fix any carpet that is loose or worn. ?Avoid having throw rugs at the top or bottom of the stairs. If you do have throw rugs, attach them to the floor with carpet tape. ?Make sure  that you have a light switch at the top of the stairs and the bottom of the stairs. If you do not have them, ask someone to add them for you. ?What else can I do to help prevent falls? ?Wear shoes that: ?Do not have high heels. ?Have rubber bottoms. ?Are comfortable and fit you well. ?Are closed at the toe. Do not wear sandals. ?If you use a stepladder: ?Make sure that it is fully opened. Do not climb a closed stepladder. ?Make sure that both sides of the stepladder are locked into place. ?Ask someone to hold it for you, if possible. ?Clearly mark and make sure that you can see: ?Any grab bars or handrails. ?First and last steps. ?Where the edge of each step is. ?Use tools that help you move around (mobility aids) if they are needed. These  include: ?Canes. ?Walkers. ?Scooters. ?Crutches. ?Turn on the lights when you go into a dark area. Replace any light bulbs as soon as they burn out. ?Set up your furniture so you have a clear path. Avoid m

## 2022-03-11 NOTE — Progress Notes (Addendum)
? ?Subjective:  ? [name] is a 81 y.o. female who presents for an Initial Medicare Annual Wellness Visit. ? ?I connected with Melora Menon today by telephone and verified that I am speaking with the correct person using two identifiers. ?Location patient: home ?Location provider: work ?Persons participating in the virtual visit: patient, provider. ?  ?I discussed the limitations, risks, security and privacy concerns of performing an evaluation and management service by telephone and the availability of in person appointments. I also discussed with the patient that there may be a patient responsible charge related to this service. The patient expressed understanding and verbally consented to this telephonic visit.  ?  ?Interactive audio and video telecommunications were attempted between this provider and patient, however failed, due to patient having technical difficulties OR patient did not have access to video capability.  We continued and completed visit with audio only. ? ?  ?Review of Systems    ? ?Cardiac Risk Factors include: advanced age (>15mn, >>29women) ? ?   ?Objective:  ?  ?Today's Vitals  ? ?There is no height or weight on file to calculate BMI. ? ? ?  03/11/2022  ? 10:06 AM 04/05/2021  ?  4:33 PM 01/30/2021  ?  9:56 AM 01/16/2021  ?  4:21 PM 01/09/2021  ?  3:56 PM 12/26/2020  ?  2:26 PM 12/25/2020  ? 10:02 AM  ?Advanced Directives  ?Does Patient Have a Medical Advance Directive? Yes Yes Yes Yes Yes Yes Yes  ?Type of [place]amed[place]of [place] will [place] City[place] will [place] will Living will Living will Living will Living will  ?Does patient want to make changes to medical advance directive?    No - Patient declined No - Patient declined No - Patient declined No - Patient declined  ?Copy of [place]? No - copy requested        ? ? ?Current Medications (verified) ?Outpatient Encounter Medications as  of 03/11/2022  ?Medication Sig  ? acetaminophen (TYLENOL) 500 MG tablet Take 500 mg by mouth every 4 (four) hours as needed.  ? amLODipine (NORVASC) 2.5 MG tablet Take 1 tablet (2.5 mg total) by mouth at bedtime. (Patient taking differently: Take 2.5 mg by mouth daily. 1/2 tablet a day)  ? b complex vitamins capsule Take 1 capsule by mouth daily.  ? Calcium-Magnesium-Vitamin D 600-40-500 MG-MG-UNIT TB24 Take 1 tablet by mouth daily at 6 PM.  ? Cholecalciferol (VITAMIN D) 50 MCG (2000 UT) CAPS Take 1 capsule by mouth daily.  ? denosumab (PROLIA) 60 MG/ML SOSY injection Inject 60 mg into the skin every 6 (six) months.  ? diazepam (VALIUM) 2 MG tablet Take 1 tablet (2 mg total) by mouth 2 (two) times daily as needed for anxiety.  ? ipratropium (ATROVENT) 0.06 % nasal spray Place 2 sprays into the nose 3 (three) times daily.  ? linaclotide (LINZESS) 145 MCG CAPS capsule Take 1 capsule (145 mcg total) by mouth daily before breakfast.  ? losartan (COZAAR) 50 MG tablet Take 50 mg at hs and 25 mg am  ? Omega-3 1000 MG CAPS Take by mouth.  ? omeprazole (PRILOSEC OTC) 20 MG tablet Take 1 tablet (20 mg total) by mouth daily for 14 days. Take 30 to 60 minutes prior to breakfast meal each day  ? ?No facility-administered encounter medications on file as of 03/11/2022.  ? ? ?Allergies (verified) ?Anesthetics, amide and Lisinopril  ? ?History: ?  Past Medical History:  ?Diagnosis Date  ? Abdominal pain, unspecified site   ? ADVERSE REACTION TO MEDICATION 07/31/2009  ? ANEMIA-NOS 07/07/2007  ? Arthritis   ? Chest pain, unspecified   ? Chronic kidney disease   ? hx of kidney stone  ? COLONIC POLYPS, ADENOMATOUS, HX OF   ? Complication of anesthesia   ? Disturbance of skin sensation   ? DIVERTICULOSIS, COLON 02/23/2009  ? Family history of diabetes mellitus   ? Family history of malignant neoplasm of breast   ? FATIGUE 05/25/2009  ? Fever, unspecified   ? Heart murmur   ? hx of  ? HYPERLIPIDEMIA 07/07/2007  ? HYPERPARATHYROIDISM UNSPECIFIED  10/20/2007  ? HYPERTENSION 02/04/2008  ? Irritable bowel syndrome (IBS)   ? NEPHROLITHIASIS, HX OF 11/15/2008  ? OSTEOPOROSIS 01/01/2008  ? Other acquired absence of organ   ? parathyroidectomy  ? Pain in limb   ? PALPITATIONS, OCCASIONAL 07/12/2008  ? PARATHYROIDECTOMY 01/02/2009  ? PONV (postoperative nausea and vomiting)   ? SCOLIOSIS 05/25/2009  ? Seasonal allergies   ? Unspecified constipation   ? URINARY INCONTINENCE 07/07/2007  ? UTI'S, RECURRENT 11/20/2009  ? kimbrough   ? ?Past Surgical History:  ?Procedure Laterality Date  ? BUNIONECTOMY    ? CATARACT EXTRACTION, BILATERAL    ? COLONOSCOPY    ? EXTRACORPOREAL SHOCK WAVE LITHOTRIPSY Left 12/21/2020  ? Procedure: EXTRACORPOREAL SHOCK WAVE LITHOTRIPSY (ESWL);  Surgeon: Festus Aloe, MD;  Location: [facility];  Service: Urology;  Laterality: Left;  ? LAPAROSCOPIC APPENDECTOMY  01/19/2016  ? Procedure: APPENDECTOMY LAPAROSCOPIC with orifice of cecal polyp;  Surgeon: Leighton Ruff, MD;  Location: [facility];  Service: General;;  ? LEFT HEART CATH AND CORONARY ANGIOGRAPHY N/A 04/07/2017  ? Procedure: Left Heart Cath and Coronary Angiography;  Surgeon: Nelva Bush, MD;  Location: [facility];  Service: Cardiovascular;  Laterality: N/A;  ? PARATHYROIDECTOMY    ? [place] open neck exploration  ? POLYPECTOMY    ? RIGHT OOPHORECTOMY    ? TONSILLECTOMY    ? ?Family History  ?Problem Relation Age of Onset  ? Heart disease Mother   ?     valve replacement  ? Sudden death Father 52  ? Heart disease Father 20  ?     MI  ? Breast cancer Paternal Aunt   ? Diabetes Other   ?     1st degree relative  ? Colon cancer Neg Hx   ? Stomach cancer Neg Hx   ? Esophageal cancer Neg Hx   ? Pancreatic cancer Neg Hx   ? ?Social History  ? ?Socioeconomic History  ? Marital status: Widowed  ?  Spouse name: Not on file  ? Number of children: 3  ? Years of education: Not on file  ? Highest education level: Not on file  ?Occupational History  ? Occupation: travel agent  ?   Employer: RETIRED  ?Tobacco Use  ? Smoking status: Never  ? Smokeless tobacco: Never  ?Vaping Use  ? Vaping Use: Never used  ?Substance and Sexual Activity  ? Alcohol use: Yes  ?  Comment: 1 glass of wine per day  ? Drug use: No  ? Sexual activity: Never  ?Other Topics Concern  ? Not on file  ?Social History Narrative  ?    ? 2-3 c coffee in am   ? ocass wine   ? G3P2vaginal delivery  ? Pt cell [phone]  ?   ? Patient walks  daily for exercise.   ? ?Social Determinants of Health  ? ?Financial Resource Strain: Low Risk   ? Difficulty of Paying Living Expenses: Not hard at all  ?Food Insecurity: No Food Insecurity  ? Worried About Charity fundraiser in the Last Year: Never true  ? Ran Out of Food in the Last Year: Never true  ?Transportation Needs: No Transportation Needs  ? Lack of Transportation (Medical): No  ? Lack of Transportation (Non-Medical): No  ?Physical Activity: Insufficiently Active  ? Days of Exercise per Week: 2 days  ? Minutes of Exercise per Session: 40 min  ?Stress: No Stress Concern Present  ? Feeling of Stress : Not at all  ?Social Connections: Moderately Isolated  ? Frequency of Communication with Friends and Family: Twice a week  ? Frequency of Social Gatherings with Friends and Family: Twice a week  ? Attends Religious Services: More than 4 times per year  ? Active Member of Clubs or Organizations: No  ? Attends Archivist Meetings: Never  ? Marital Status: Widowed  ? ? ?Tobacco Counseling ?Counseling given: Not Answered ? ? ?Clinical Intake: ? ?Pre-visit preparation completed: Yes ? ?Pain : No/denies pain ? ?  ? ?Nutritional Risks: None ?Diabetes: No ? ?How often do you need to have someone help you when you read instructions, pamphlets, or other written materials from your doctor or pharmacy?: 1 - Never ?What is the last grade level you completed in school?: High school ? ?Diabetic?no  ? ?Interpreter Needed?: No ? ?Information entered by :: B.[email or web address],TDV ? ? ?Activities of Daily  Living ? ?  03/11/2022  ? 10:06 AM  ?In your present state of health, do you have any difficulty performing the following activities:  ?Hearing? 0  ?Vision? 0  ?Difficulty concentrating or making decisions? 0  ?W

## 2022-03-25 ENCOUNTER — Ambulatory Visit: Payer: Medicare Other

## 2022-03-27 ENCOUNTER — Ambulatory Visit (INDEPENDENT_AMBULATORY_CARE_PROVIDER_SITE_OTHER): Payer: Medicare Other | Admitting: Family Medicine

## 2022-03-27 ENCOUNTER — Encounter: Payer: Self-pay | Admitting: Family Medicine

## 2022-03-27 VITALS — BP 142/90 | HR 53 | Temp 97.8°F | Ht 59.0 in | Wt 109.0 lb

## 2022-03-27 DIAGNOSIS — J22 Unspecified acute lower respiratory infection: Secondary | ICD-10-CM

## 2022-03-27 DIAGNOSIS — R058 Other specified cough: Secondary | ICD-10-CM

## 2022-03-27 MED ORDER — AZITHROMYCIN 250 MG PO TABS: ORAL_TABLET | ORAL | 0 refills | Status: AC

## 2022-03-27 MED ORDER — BENZONATATE 200 MG PO CAPS: 200.0000 mg | ORAL_CAPSULE | Freq: Two times a day (BID) | ORAL | 0 refills | Status: DC | PRN

## 2022-03-27 NOTE — Progress Notes (Signed)
Subjective: ? [name] is a 81 y.o. female who presents for a one week history of feeling cold, body aches and fatigue. She then developed clear drainage from her eyes and nose as well as sneezing.  ? ?States she is now coughing up yellowish, thick sputum and having chest congestion. Having trouble taking a deep breath at times.  ?Using Atrovent nasal spray as needed.  ? ?No fever, dizziness, chest pain, palpitations, abdominal pain, N/V/D.  ? ?States she had 3 negative Covid tests at [facility]  ? ?  No other aggravating or relieving factors.  No other c/o. ? ?ROS as in subjective. ? ?Past Medical History:  ?Diagnosis Date  ? Abdominal pain, unspecified site   ? ADVERSE REACTION TO MEDICATION 07/31/2009  ? ANEMIA-NOS 07/07/2007  ? Arthritis   ? Chest pain, unspecified   ? Chronic kidney disease   ? hx of kidney stone  ? COLONIC POLYPS, ADENOMATOUS, HX OF   ? Complication of anesthesia   ? Disturbance of skin sensation   ? DIVERTICULOSIS, COLON 02/23/2009  ? Family history of diabetes mellitus   ? Family history of malignant neoplasm of breast   ? FATIGUE 05/25/2009  ? Fever, unspecified   ? Heart murmur   ? hx of  ? HYPERLIPIDEMIA 07/07/2007  ? HYPERPARATHYROIDISM UNSPECIFIED 10/20/2007  ? HYPERTENSION 02/04/2008  ? Irritable bowel syndrome (IBS)   ? NEPHROLITHIASIS, HX OF 11/15/2008  ? OSTEOPOROSIS 01/01/2008  ? Other acquired absence of organ   ? parathyroidectomy  ? Pain in limb   ? PALPITATIONS, OCCASIONAL 07/12/2008  ? PARATHYROIDECTOMY 01/02/2009  ? PONV (postoperative nausea and vomiting)   ? SCOLIOSIS 05/25/2009  ? Seasonal allergies   ? Unspecified constipation   ? URINARY INCONTINENCE 07/07/2007  ? UTI'S, RECURRENT 11/20/2009  ? kimbrough   ? ?Current Outpatient Medications on File Prior to Visit  ?Medication Sig Dispense Refill  ? acetaminophen (TYLENOL) 500 MG tablet Take 500 mg by mouth every 4 (four) hours as needed.    ? amLODipine (NORVASC) 2.5 MG tablet Take 1 tablet (2.5 mg total) by mouth at bedtime.  (Patient taking differently: Take 2.5 mg by mouth daily. 1/2 tablet a day) 90 tablet 3  ? b complex vitamins capsule Take 1 capsule by mouth daily.    ? Calcium-Magnesium-Vitamin D 600-40-500 MG-MG-UNIT TB24 Take 1 tablet by mouth daily at 6 PM.    ? Cholecalciferol (VITAMIN D) 50 MCG (2000 UT) CAPS Take 1 capsule by mouth daily.    ? denosumab (PROLIA) 60 MG/ML SOSY injection Inject 60 mg into the skin every 6 (six) months.    ? diazepam (VALIUM) 2 MG tablet Take 1 tablet (2 mg total) by mouth 2 (two) times daily as needed for anxiety. 60 tablet 2  ? ipratropium (ATROVENT) 0.06 % nasal spray Place 2 sprays into the nose 3 (three) times daily. 15 mL 2  ? linaclotide (LINZESS) 145 MCG CAPS capsule Take 1 capsule (145 mcg total) by mouth daily before breakfast. 30 capsule 2  ? losartan (COZAAR) 50 MG tablet Take 50 mg at hs and 25 mg am 90 tablet 3  ? Omega-3 1000 MG CAPS Take by mouth.    ? omeprazole (PRILOSEC OTC) 20 MG tablet Take 1 tablet (20 mg total) by mouth daily for 14 days. Take 30 to 60 minutes prior to breakfast meal each day 14 tablet 0  ? ?No current facility-administered medications on file prior to visit.  ? ? ? ? ?Objective: ?Vitals:  ?  03/27/22 1447  ?BP: (!) 142/90  ?Pulse: (!) 53  ?Temp: 97.8 ?F (36.6 ?C)  ?SpO2: 100%  ? ? ?General appearance: Alert, WD/WN, no distress, mildly ill appearing ?                            Skin: warm and dry  ?                          ?                         Heart: RRR ?                        Lungs: CTA bilaterally, no wheezes, rales, or rhonchi. Normal work of breathing. Congested cough during the visit.  ?    ? ?Assessment: ?Productive cough - Plan: azithromycin (ZITHROMAX) 250 MG tablet, benzonatate (TESSALON) 200 MG capsule ? ?Lower respiratory infection - Plan: azithromycin (ZITHROMAX) 250 MG tablet ? ? ?Plan: ?Discussed diagnosis and treatment of lower respiratory tract infection post viral illness. Azithromycin and Tessalon prescribed.   Suggested  symptomatic OTC remedies such as hydration, cough lozenges and Mucinex.  ?Nasal saline spray for congestion.  Tylenol or Ibuprofen OTC for fever and malaise.  Call/return if worsening or not back to baseline.     ? ?

## 2022-03-27 NOTE — Patient Instructions (Signed)
?  Start on the antibiotic.  ?You can take plain Mucinex (not DM).  ?I also prescribed Tessalon Perles for cough.  ? ?Follow up if you are getting worse or not back to baseline after finishing the antibiotic.  ?

## 2022-04-03 ENCOUNTER — Ambulatory Visit (INDEPENDENT_AMBULATORY_CARE_PROVIDER_SITE_OTHER): Payer: Medicare Other

## 2022-04-03 ENCOUNTER — Encounter: Payer: Self-pay | Admitting: Internal Medicine

## 2022-04-03 ENCOUNTER — Ambulatory Visit (INDEPENDENT_AMBULATORY_CARE_PROVIDER_SITE_OTHER): Payer: Medicare Other | Admitting: Internal Medicine

## 2022-04-03 DIAGNOSIS — J309 Allergic rhinitis, unspecified: Secondary | ICD-10-CM | POA: Diagnosis not present

## 2022-04-03 DIAGNOSIS — J069 Acute upper respiratory infection, unspecified: Secondary | ICD-10-CM

## 2022-04-03 DIAGNOSIS — S22000A Wedge compression fracture of unspecified thoracic vertebra, initial encounter for closed fracture: Secondary | ICD-10-CM | POA: Diagnosis not present

## 2022-04-03 DIAGNOSIS — R051 Acute cough: Secondary | ICD-10-CM | POA: Diagnosis not present

## 2022-04-03 DIAGNOSIS — I10 Essential (primary) hypertension: Secondary | ICD-10-CM

## 2022-04-03 DIAGNOSIS — M8448XA Pathological fracture, other site, initial encounter for fracture: Secondary | ICD-10-CM | POA: Insufficient documentation

## 2022-04-03 DIAGNOSIS — R059 Cough, unspecified: Secondary | ICD-10-CM | POA: Insufficient documentation

## 2022-04-03 DIAGNOSIS — R768 Other specified abnormal immunological findings in serum: Secondary | ICD-10-CM | POA: Insufficient documentation

## 2022-04-03 MED ORDER — PREDNISONE 10 MG PO TABS: ORAL_TABLET | ORAL | 0 refills | Status: DC

## 2022-04-03 MED ORDER — METHYLPREDNISOLONE ACETATE 80 MG/ML IJ SUSP
80.0000 mg | Freq: Once | INTRAMUSCULAR | Status: AC
  Administered 2022-04-03: 80 mg via INTRAMUSCULAR

## 2022-04-03 NOTE — Patient Instructions (Signed)
You had the steroid shot today ? ?Please take all new medication as prescribed - the short course of prednisone for probable allergies ? ?Please also consider Allegra 180 mg per day, and Nasacort as directed OTC for allergies as well ? ?Please continue all other medications as before, and refills have been done if requested. ? ?Please have the pharmacy call with any other refills you may need. ? ?Please keep your appointments with your specialists as you may have planned ? ?Please go to the XRAY Department in the first floor for the x-ray testing ? ?You will be contacted by phone if any changes need to be made immediately.  Otherwise, you will receive a letter about your results with an explanation, but please check with MyChart first. ? ?Please remember to sign up for MyChart if you have not done so, as this will be important to you in the future with finding out test results, communicating by private email, and scheduling acute appointments online when needed. ? ? ? ? ? ? ? ? ?

## 2022-04-03 NOTE — Progress Notes (Signed)
Patient ID: [name], female   DOB: 1941/03/27, 81 y.o.   MRN: [id number]        Chief Complaint: follow up recent illness, allergies, cough       HPI:  [name] is a 81 y.o. female here with c/o recent onset URI symptoms now improved, with productive congestion and cough x 2 wks, with sob, fatigue, sleeping more but much improved in the last 2 days; tx with zpack on may 10 and improved   Pt denies chest pain, increased sob or doe, wheezing, orthopnea, PND, increased LE swelling, palpitations, dizziness or syncope.   Pt denies polydipsia, polyuria, or new focal neuro s/s.   Does have 2 months ongoing nasal allergy symptoms with clearish congestion, itch and sneezing, without fever, pain, ST, cough, swelling or wheezing. Pt concerned about persistent cough and pna and has had sensation of "bubbles in the chest."    Wt Readings from Last 3 Encounters:  04/03/22 106 lb 12.8 oz (48.4 kg)  03/27/22 109 lb (49.4 kg)  01/30/22 109 lb 12.8 oz (49.8 kg)   BP Readings from Last 3 Encounters:  04/03/22 (!) 142/70  03/27/22 (!) 142/90  01/30/22 136/70         Past Medical History:  Diagnosis Date   Abdominal pain, unspecified site    ADVERSE REACTION TO MEDICATION 07/31/2009   ANEMIA-NOS 07/07/2007   Arthritis    Chest pain, unspecified    Chronic kidney disease    hx of kidney stone   COLONIC POLYPS, ADENOMATOUS, HX OF    Complication of anesthesia    Disturbance of skin sensation    DIVERTICULOSIS, COLON 02/23/2009   Family history of diabetes mellitus    Family history of malignant neoplasm of breast    FATIGUE 05/25/2009   Fever, unspecified    Heart murmur    hx of   HYPERLIPIDEMIA 07/07/2007   HYPERPARATHYROIDISM UNSPECIFIED 10/20/2007   HYPERTENSION 02/04/2008   Irritable bowel syndrome (IBS)    NEPHROLITHIASIS, HX OF 11/15/2008   OSTEOPOROSIS 01/01/2008   Other acquired absence of organ    parathyroidectomy   Pain in limb    PALPITATIONS, OCCASIONAL 07/12/2008    PARATHYROIDECTOMY 01/02/2009   PONV (postoperative nausea and vomiting)    SCOLIOSIS 05/25/2009   Seasonal allergies    Unspecified constipation    URINARY INCONTINENCE 07/07/2007   UTI'S, RECURRENT 11/20/2009   kimbrough    Past Surgical History:  Procedure Laterality Date   BUNIONECTOMY     CATARACT EXTRACTION, BILATERAL     COLONOSCOPY     EXTRACORPOREAL SHOCK WAVE LITHOTRIPSY Left 12/21/2020   Procedure: EXTRACORPOREAL SHOCK WAVE LITHOTRIPSY (ESWL);  Surgeon: Festus Aloe, MD;  Location: [facility];  Service: Urology;  Laterality: Left;   LAPAROSCOPIC APPENDECTOMY  01/19/2016   Procedure: APPENDECTOMY LAPAROSCOPIC with orifice of cecal polyp;  Surgeon: Leighton Ruff, MD;  Location: [facility];  Service: General;;   LEFT HEART CATH AND CORONARY ANGIOGRAPHY N/A 04/07/2017   Procedure: Left Heart Cath and Coronary Angiography;  Surgeon: Nelva Bush, MD;  Location: [facility];  Service: Cardiovascular;  Laterality: N/A;   PARATHYROIDECTOMY     Rt Superior open neck exploration   POLYPECTOMY     RIGHT OOPHORECTOMY     TONSILLECTOMY      reports that she has never smoked. She has never used smokeless tobacco. She reports current alcohol use. She reports that she does not use drugs. family history includes Breast cancer in  her paternal aunt; Diabetes in an other family member; Heart disease in her mother; Heart disease (age of onset: 43) in her father; Sudden death (age of onset: 61) in her father. Allergies  Allergen Reactions   Anesthetics, Amide Other (See Comments)    Pt states she had hallucinations and HTN after procedure 01/19/16-she feels may have been reaction to anesthetic   Lisinopril Cough   Current Outpatient Medications on File Prior to Visit  Medication Sig Dispense Refill   acetaminophen (TYLENOL) 500 MG tablet Take 500 mg by mouth every 4 (four) hours as needed.     amLODipine (NORVASC) 2.5 MG tablet Take 1 tablet (2.5 mg total) by mouth at bedtime.  (Patient taking differently: Take 2.5 mg by mouth daily. 1/2 tablet a day) 90 tablet 3   b complex vitamins capsule Take 1 capsule by mouth daily.     Calcium-Magnesium-Vitamin D 600-40-500 MG-MG-UNIT TB24 Take 1 tablet by mouth daily at 6 PM.     Cholecalciferol (VITAMIN D) 50 MCG (2000 UT) CAPS Take 1 capsule by mouth daily.     denosumab (PROLIA) 60 MG/ML SOSY injection Inject 60 mg into the skin every 6 (six) months.     diazepam (VALIUM) 2 MG tablet Take 1 tablet (2 mg total) by mouth 2 (two) times daily as needed for anxiety. 60 tablet 2   guaiFENesin (MUCINEX) 600 MG 12 hr tablet Take by mouth 2 (two) times daily.     ipratropium (ATROVENT) 0.06 % nasal spray Place 2 sprays into the nose 3 (three) times daily. 15 mL 2   linaclotide (LINZESS) 145 MCG CAPS capsule Take 1 capsule (145 mcg total) by mouth daily before breakfast. 30 capsule 2   losartan (COZAAR) 50 MG tablet Take 50 mg at hs and 25 mg am 90 tablet 3   Omega-3 1000 MG CAPS Take by mouth.     benzonatate (TESSALON) 200 MG capsule Take 1 capsule (200 mg total) by mouth 2 (two) times daily as needed for cough. (Patient not taking: Reported on 04/03/2022) 20 capsule 0   omeprazole (PRILOSEC OTC) 20 MG tablet Take 1 tablet (20 mg total) by mouth daily for 14 days. Take 30 to 60 minutes prior to breakfast meal each day 14 tablet 0   No current facility-administered medications on file prior to visit.        ROS:  All others reviewed and negative.  Objective        PE:  BP (!) 142/70 (BP Location: Right Arm, Patient Position: Sitting, Cuff Size: Normal)   Pulse 65   Temp 98.2 F (36.8 C) (Oral)   Ht '4\' 11"'$  (1.499 m)   Wt 106 lb 12.8 oz (48.4 kg)   SpO2 97%   BMI 21.57 kg/m                 Constitutional: Pt appears in NAD               HENT: Head: NCAT.                Right Ear: External ear normal.                 Left Ear: External ear normal.  Bilat tm's with mild erythema.  Max sinus areas non tender.  Pharynx with mild  erythema, no exudate               Eyes: . Pupils are equal, round, and reactive to light. Conjunctivae and  EOM are normal               Nose: without d/c or deformity               Neck: Neck supple. Gross normal ROM               Cardiovascular: Normal rate and regular rhythm.                 Pulmonary/Chest: Effort normal and breath sounds without rales or wheezing.                               Neurological: Pt is alert. At baseline orientation, motor grossly intact               Skin: Skin is warm. No rashes, no other new lesions, LE edema - none               Psychiatric: Pt behavior is normal without agitation   Micro: none  Cardiac tracings I have personally interpreted today:  none  Pertinent Radiological findings (summarize): none   Lab Results  Component Value Date   WBC 7.0 04/05/2021   HGB 12.8 04/05/2021   HCT 38.3 04/05/2021   PLT 195 04/05/2021   GLUCOSE 92 04/05/2021   CHOL 132 01/25/2015   TRIG 64 01/25/2015   HDL 68 01/25/2015   LDLDIRECT 156.3 03/28/2011   LDLCALC 51 01/25/2015   ALT 18 12/18/2017   AST 29 12/18/2017   NA 140 04/05/2021   K 3.8 04/05/2021   CL 104 04/05/2021   CREATININE 0.83 04/05/2021   BUN 19 04/05/2021   CO2 27 04/05/2021   TSH 1.42 01/30/2021   INR 1.0 04/01/2017   Assessment/Plan:  [name] is a 81 y.o. White or Caucasian [1] female with  has a past medical history of Abdominal pain, unspecified site, ADVERSE REACTION TO MEDICATION (07/31/2009), ANEMIA-NOS (07/07/2007), Arthritis, Chest pain, unspecified, Chronic kidney disease, COLONIC POLYPS, ADENOMATOUS, HX OF, Complication of anesthesia, Disturbance of skin sensation, DIVERTICULOSIS, COLON (02/23/2009), Family history of diabetes mellitus, Family history of malignant neoplasm of breast, FATIGUE (05/25/2009), Fever, unspecified, Heart murmur, HYPERLIPIDEMIA (07/07/2007), HYPERPARATHYROIDISM UNSPECIFIED (10/20/2007), HYPERTENSION (02/04/2008), Irritable bowel syndrome (IBS),  NEPHROLITHIASIS, HX OF (11/15/2008), OSTEOPOROSIS (01/01/2008), Other acquired absence of organ, Pain in limb, PALPITATIONS, OCCASIONAL (07/12/2008), PARATHYROIDECTOMY (01/02/2009), PONV (postoperative nausea and vomiting), SCOLIOSIS (05/25/2009), Seasonal allergies, Unspecified constipation, URINARY INCONTINENCE (07/07/2007), and UTI'S, RECURRENT (11/20/2009).  URI (upper respiratory infection) Recent onset now improved after zpack, no further antibx needed,  to f/u any worsening symptoms or concerns  Allergic rhinitis Mild to mod, for short prednisone course, OTC allegra and nasacort asd,  to f/u any worsening symptoms or concern  Cough With "bubbles in the chest" suspect likely allergy related as above, but will need cxr r/o pna  Essential hypertension BP Readings from Last 3 Encounters:  04/03/22 (!) 142/70  03/27/22 (!) 142/90  01/30/22 136/70   Mild elevated likely situational, pt to continue medical treatment norvasc, losartan  Followup: Return if symptoms worsen or fail to improve.  Cathlean Cower, MD 04/07/2022 5:26 AM [facility]

## 2022-04-07 ENCOUNTER — Encounter: Payer: Self-pay | Admitting: Internal Medicine

## 2022-04-07 NOTE — Assessment & Plan Note (Signed)
Recent onset now improved after zpack, no further antibx needed,  to f/u any worsening symptoms or concerns

## 2022-04-07 NOTE — Assessment & Plan Note (Addendum)
Mild to mod, for short prednisone course, OTC allegra and nasacort asd,  to f/u any worsening symptoms or concern

## 2022-04-07 NOTE — Assessment & Plan Note (Signed)
With "bubbles in the chest" suspect likely allergy related as above, but will need cxr r/o pna

## 2022-04-07 NOTE — Assessment & Plan Note (Signed)
BP Readings from Last 3 Encounters:  04/03/22 (!) 142/70  03/27/22 (!) 142/90  01/30/22 136/70   Mild elevated likely situational, pt to continue medical treatment norvasc, losartan

## 2022-04-09 DIAGNOSIS — R768 Other specified abnormal immunological findings in serum: Secondary | ICD-10-CM | POA: Diagnosis not present

## 2022-04-09 DIAGNOSIS — Z681 Body mass index (BMI) 19 or less, adult: Secondary | ICD-10-CM | POA: Diagnosis not present

## 2022-04-09 DIAGNOSIS — M810 Age-related osteoporosis without current pathological fracture: Secondary | ICD-10-CM | POA: Diagnosis not present

## 2022-04-09 DIAGNOSIS — M4854XA Collapsed vertebra, not elsewhere classified, thoracic region, initial encounter for fracture: Secondary | ICD-10-CM | POA: Diagnosis not present

## 2022-04-09 DIAGNOSIS — I959 Hypotension, unspecified: Secondary | ICD-10-CM | POA: Diagnosis not present

## 2022-04-16 DIAGNOSIS — M810 Age-related osteoporosis without current pathological fracture: Secondary | ICD-10-CM | POA: Diagnosis not present

## 2022-04-17 ENCOUNTER — Ambulatory Visit (INDEPENDENT_AMBULATORY_CARE_PROVIDER_SITE_OTHER): Payer: Medicare Other | Admitting: Family

## 2022-04-17 ENCOUNTER — Encounter (HOSPITAL_BASED_OUTPATIENT_CLINIC_OR_DEPARTMENT_OTHER): Payer: Self-pay | Admitting: Family

## 2022-04-17 VITALS — BP 110/70 | HR 53 | Ht 59.0 in | Wt 109.1 lb

## 2022-04-17 DIAGNOSIS — I25118 Atherosclerotic heart disease of native coronary artery with other forms of angina pectoris: Secondary | ICD-10-CM

## 2022-04-17 DIAGNOSIS — E785 Hyperlipidemia, unspecified: Secondary | ICD-10-CM | POA: Diagnosis not present

## 2022-04-17 DIAGNOSIS — I10 Essential (primary) hypertension: Secondary | ICD-10-CM | POA: Diagnosis not present

## 2022-04-17 NOTE — Progress Notes (Unsigned)
Office Visit    Patient Name: [name] Date of Encounter: 04/17/2022  PCP:  Cassandria Anger, MD   [place]  Cardiologist:  Buford Dresser, MD  Advanced Practice Provider:  No care team member to display Electrophysiologist:  None   Chief Complaint    [name] is a 81 y.o. female with a hx of hypertension, CAD, HLD, PAF during hospitalization in 2017 presents today for labile blood pressure  Past Medical History    Past Medical History:  Diagnosis Date   Abdominal pain, unspecified site    ADVERSE REACTION TO MEDICATION 07/31/2009   ANEMIA-NOS 07/07/2007   Arthritis    Chest pain, unspecified    Chronic kidney disease    hx of kidney stone   COLONIC POLYPS, ADENOMATOUS, HX OF    Complication of anesthesia    Disturbance of skin sensation    DIVERTICULOSIS, COLON 02/23/2009   Family history of diabetes mellitus    Family history of malignant neoplasm of breast    FATIGUE 05/25/2009   Fever, unspecified    Heart murmur    hx of   HYPERLIPIDEMIA 07/07/2007   HYPERPARATHYROIDISM UNSPECIFIED 10/20/2007   HYPERTENSION 02/04/2008   Irritable bowel syndrome (IBS)    NEPHROLITHIASIS, HX OF 11/15/2008   OSTEOPOROSIS 01/01/2008   Other acquired absence of organ    parathyroidectomy   Pain in limb    PALPITATIONS, OCCASIONAL 07/12/2008   PARATHYROIDECTOMY 01/02/2009   PONV (postoperative nausea and vomiting)    SCOLIOSIS 05/25/2009   Seasonal allergies    Unspecified constipation    URINARY INCONTINENCE 07/07/2007   UTI'S, RECURRENT 11/20/2009   kimbrough    Past Surgical History:  Procedure Laterality Date   BUNIONECTOMY     CATARACT EXTRACTION, BILATERAL     COLONOSCOPY     EXTRACORPOREAL SHOCK WAVE LITHOTRIPSY Left 12/21/2020   Procedure: EXTRACORPOREAL SHOCK WAVE LITHOTRIPSY (ESWL);  Surgeon: Festus Aloe, MD;  Location: [facility];  Service: Urology;  Laterality: Left;   LAPAROSCOPIC APPENDECTOMY  01/19/2016    Procedure: APPENDECTOMY LAPAROSCOPIC with orifice of cecal polyp;  Surgeon: Leighton Ruff, MD;  Location: [facility];  Service: General;;   LEFT HEART CATH AND CORONARY ANGIOGRAPHY N/A 04/07/2017   Procedure: Left Heart Cath and Coronary Angiography;  Surgeon: Nelva Bush, MD;  Location: [facility];  Service: Cardiovascular;  Laterality: N/A;   PARATHYROIDECTOMY     Rt Superior open neck exploration   POLYPECTOMY     RIGHT OOPHORECTOMY     TONSILLECTOMY      Allergies  Allergies  Allergen Reactions   Anesthetics, Amide Other (See Comments)    Pt states she had hallucinations and HTN after procedure 01/19/16-she feels may have been reaction to anesthetic   Lisinopril Cough    History of Present Illness    [name] is a 81 y.o. female with a hx of HTN, CAD (nonobstructive by cath 2018), HLD, PAF during hospitalization 2017 last seen 08/28/2021 by Dr. Harrell Gave.  Prior patient of Dr. Saunders Revel.  Established with Dr. Harrell Gave 07/19/2019.  Last seen 08/2021 noting persistent cough, low stamina, shortness of breath with mild activity.  She was noted to have labile blood pressure with overall average in the 150s.  She was to continue losartan 25 mg twice daily and recommended to trial taking her amlodipine in the morning versus evening to see which she tolerated best.  She presents today for follow-up.  Notes her blood  pressure has been very labile.Having readings of systolic <854 but also >627. Taking Losartan BID and Amlodipine in the morning. Does note some ligtheadedness with position change. No near syncope, syncope, chest pain, dyspnea.    EKGs/Labs/Other Studies Reviewed:   The following studies were reviewed today:   EKG:  EKG is  ordered today.  The ekg ordered today demonstrates sinus bradycardia 53 bpm with incomplete right bundle branch block and no acute ST/T wave changes.  Recent Labs: No results found for requested labs within last 8760 hours.  Recent Lipid  Panel    Component Value Date/Time   CHOL 132 01/25/2015 1112   TRIG 64 01/25/2015 1112   HDL 68 01/25/2015 1112   CHOLHDL 1.9 01/25/2015 1112   VLDL 13 01/25/2015 1112   LDLCALC 51 01/25/2015 1112   LDLDIRECT 156.3 03/28/2011 1038    Home Medications   Current Meds  Medication Sig   acetaminophen (TYLENOL) 500 MG tablet Take 500 mg by mouth every 4 (four) hours as needed.   amLODipine (NORVASC) 2.5 MG tablet Take 1 tablet (2.5 mg total) by mouth at bedtime. (Patient taking differently: Take 2.5 mg by mouth daily. 1/2 tablet a day)   b complex vitamins capsule Take 1 capsule by mouth daily.   Calcium-Magnesium-Vitamin D 600-40-500 MG-MG-UNIT TB24 Take 1 tablet by mouth daily at 6 PM.   Cholecalciferol (VITAMIN D) 50 MCG (2000 UT) CAPS Take 1 capsule by mouth daily.   denosumab (PROLIA) 60 MG/ML SOSY injection Inject 60 mg into the skin every 6 (six) months.   guaiFENesin (MUCINEX) 600 MG 12 hr tablet Take by mouth 2 (two) times daily.   ipratropium (ATROVENT) 0.06 % nasal spray Place 2 sprays into the nose 3 (three) times daily.   losartan (COZAAR) 50 MG tablet Take 50 mg at hs and 25 mg am   [DISCONTINUED] omeprazole (PRILOSEC OTC) 20 MG tablet Take 1 tablet (20 mg total) by mouth daily for 14 days. Take 30 to 60 minutes prior to breakfast meal each day     Review of Systems      All other systems reviewed and are otherwise negative except as noted above.  Physical Exam    VS:  BP 110/70 (BP Location: Left Arm, Patient Position: Sitting, Cuff Size: Normal)   Pulse (!) 53   Ht '4\' 11"'$  (1.499 m)   Wt 109 lb 1.6 oz (49.5 kg)   BMI 22.04 kg/m  , BMI Body mass index is 22.04 kg/m.  Wt Readings from Last 3 Encounters:  04/17/22 109 lb 1.6 oz (49.5 kg)  04/03/22 106 lb 12.8 oz (48.4 kg)  03/27/22 109 lb (49.4 kg)     GEN: Well nourished, well developed, in no acute distress. HEENT: normal. Neck: Supple, no JVD, carotid bruits, or masses. Cardiac: RRR, no murmurs, rubs, or  gallops. No clubbing, cyanosis, edema.  Radials/PT 2+ and equal bilaterally.  Respiratory:  Respirations regular and unlabored, clear to auscultation bilaterally. GI: Soft, nontender, nondistended. MS: No deformity or atrophy. Skin: Warm and dry, no rash. Neuro:  Strength and sensation are intact. Psych: Normal affect.  Assessment & Plan    CAD / HLD - Minimal CAD by cath 2018. No BB due to bradycardia. Atorvastatin has fallen off med list since last visit - noted after visit - will call patient to inquire if she is still taking. LDL goal <70. Heart healthy diet and regular cardiovascular exercise encouraged.    HTN - BP remains labile. Episodes of  hypotension at home but also hypertension. BP today 110/70. Concern for element of orthostasis - precautions discussed. Difficult to ascertain trend as she did not bring log of BP today.  Monitor BP at home and send log Bring BP cuff to next clinic visit.  Continue Losartan '25mg'$  BID. If SBP <110, hold Losartan.  Continue Amlodipine QAM. CBC, BMP, mag to rule out anemia or electrolyte abnormality as contributory to hypotension episode.      Disposition: Follow up in 1 month(s) with Buford Dresser, MD or APP.  Signed, Loel Dubonnet, NP 04/17/2022, 1:27 PM [facility]

## 2022-04-17 NOTE — Patient Instructions (Signed)
Medication Instructions:  Your Physician recommend you continue on your current medication as directed.    If your morning blood pressure is less than 110- Hold Losartan and take Amlodipine   If your evening blood pressure is less than 110 hold Losartan   *If you need a refill on your cardiac medications before your next appointment, please call your pharmacy*   Lab Work: Please return for Lab work this week- CBC, BMET, Mag . You may come to the...   [facility] (3rd floor) [address] [place], [place] [address]  Open: 8am-Noon and 1pm-4:30pm   [place] [facility] [place]- Any location  **no appointments needed**  If you have labs (blood work) drawn today and your tests are completely normal, you will receive your results only by: [employer] (if you have MyChart) OR A paper copy in the mail If you have any lab test that is abnormal or we need to change your treatment, we will call you to review the results.   Testing/Procedures: None ordered today    Follow-Up: At [facility], you and your health needs are our priority.  As part of our continuing mission to provide you with exceptional heart care, we have created designated Provider Care Teams.  These Care Teams include your primary Cardiologist (physician) and Advanced Practice Providers (APPs -  Physician Assistants and Nurse Practitioners) who all work together to provide you with the care you need, when you need it.  We recommend signing up for the patient portal called "MyChart".  Sign up information is provided on this After Visit Summary.  MyChart is used to connect with patients for Virtual Visits (Telemedicine).  Patients are able to view lab/test results, encounter notes, upcoming appointments, etc.  Non-urgent messages can be sent to your provider as well.   To learn more about what you can do with MyChart, go to [email or web address].     Your next appointment:   1 month(s)  The format for your next appointment:   In Person  Provider:   Buford Dresser, MD or Laurann Montana, NP{  Other Instructions Orthostatic Hypotension Blood pressure is a measurement of how strongly, or weakly, your circulating blood is pressing against the walls of your arteries. Orthostatic hypotension is a drop in blood pressure that can happen when you change positions, such as when you go from lying down to standing. Arteries are blood vessels that carry blood from your heart throughout your body. When blood pressure is too low, you may not get enough blood to your brain or to the rest of your organs. Orthostatic hypotension can cause light-headedness, sweating, rapid heartbeat, blurred vision, and fainting. These symptoms require further investigation into the cause. What are the causes? Orthostatic hypotension can be caused by many things, including: Sudden changes in posture, such as standing up quickly after you have been sitting or lying down. Loss of blood (anemia) or loss of body fluids (dehydration). Heart problems, neurologic problems, or hormone problems. Pregnancy. Aging. The risk for this condition increases as you get older. Severe infection (sepsis). Certain medicines, such as medicines for high blood pressure or medicines that make the body lose excess fluids (diuretics). What are the signs or symptoms? Symptoms of this condition may include: Weakness, light-headedness, or dizziness. Sweating. Blurred vision. Tiredness (fatigue). Rapid heartbeat. Fainting, in severe cases. How is this diagnosed? This condition is diagnosed based on: Your symptoms and medical history. Your blood pressure measurements.  Your health care provider will check your blood pressure when you are: Lying down. Sitting. Standing. A blood pressure reading is recorded as two numbers, such as "120 over 80" (or 120/80). The first ("top") number is  called the systolic pressure. It is a measure of the pressure in your arteries as your heart beats. The second ("bottom") number is called the diastolic pressure. It is a measure of the pressure in your arteries when your heart relaxes between beats. Blood pressure is measured in a unit called mmHg. Healthy blood pressure for most adults is 120/80 mmHg. Orthostatic hypotension is defined as a 20 mmHg drop in systolic pressure or a 10 mmHg drop in diastolic pressure within 3 minutes of standing. Other information or tests that may be used to diagnose orthostatic hypotension include: Your other vital signs, such as your heart rate and temperature. Blood tests. An electrocardiogram (ECG) or echocardiogram. A Holter monitor. This is a device you wear that records your heart rhythm continuously, usually for 24-48 hours. Tilt table test. For this test, you will be safely secured to a table that moves you from a lying position to an upright position. Your heart rhythm and blood pressure will be monitored during the test. How is this treated? This condition may be treated by: Changing your diet. This may involve eating more salt (sodium) or drinking more water. Changing the dosage of certain medicines you are taking that might be lowering your blood pressure. Correcting the underlying reason for the orthostatic hypotension. Wearing compression stockings. Taking medicines to raise your blood pressure. Avoiding actions that trigger symptoms. Follow these instructions at home: Medicines Take over-the-counter and prescription medicines only as told by your health care provider. Follow instructions from your health care provider about changing the dosage of your current medicines, if this applies. Do not stop or adjust any of your medicines on your own. Eating and drinking  Drink enough fluid to keep your urine pale yellow. Eat extra salt only as directed. Do not add extra salt to your diet unless advised  by your health care provider. Eat frequent, small meals. Avoid standing up suddenly after eating. General instructions  Get up slowly from lying down or sitting positions. This gives your blood pressure a chance to adjust. Avoid hot showers and excessive heat as directed by your health care provider. Engage in regular physical activity as directed by your health care provider. If you have compression stockings, wear them as told. Keep all follow-up visits. This is important. Contact a health care provider if: You have a fever for more than 2-3 days. You feel more thirsty than usual. You feel dizzy or weak. Get help right away if: You have chest pain. You have a fast or irregular heartbeat. You become sweaty or feel light-headed. You feel short of breath. You faint. You have any symptoms of a stroke. "BE FAST" is an easy way to remember the main warning signs of a stroke: B - Balance. Signs are dizziness, sudden trouble walking, or loss of balance. E - Eyes. Signs are trouble seeing or a sudden change in vision. F - Face. Signs are sudden weakness or numbness of the face, or the face or eyelid drooping on one side. A - Arms. Signs are weakness or numbness in an arm. This happens suddenly and usually on one side of the body. S - Speech. Signs are sudden trouble speaking, slurred speech, or trouble understanding what people say. T - Time. Time to call emergency services.  Write down what time symptoms started. You have other signs of a stroke, such as: A sudden, severe headache with no known cause. Nausea or vomiting. Seizure. These symptoms may represent a serious problem that is an emergency. Do not wait to see if the symptoms will go away. Get medical help right away. Call your local emergency services (911 in the [place] Do not drive yourself to the hospital. Summary Orthostatic hypotension is a sudden drop in blood pressure. It can cause light-headedness, sweating, rapid heartbeat,  blurred vision, and fainting. Orthostatic hypotension can be diagnosed by having your blood pressure taken while lying down, sitting, and then standing. Treatment may involve changing your diet, wearing compression stockings, sitting up slowly, adjusting your medicines, or correcting the underlying reason for the orthostatic hypotension. Get help right away if you have chest pain, a fast or irregular heartbeat, or symptoms of a stroke. This information is not intended to replace advice given to you by your health care provider. Make sure you discuss any questions you have with your health care provider. Document Revised: 01/18/2021 Document Reviewed: 01/18/2021 Elsevier Patient Education  [place]

## 2022-04-19 ENCOUNTER — Encounter (HOSPITAL_BASED_OUTPATIENT_CLINIC_OR_DEPARTMENT_OTHER): Payer: Self-pay | Admitting: Family

## 2022-04-19 DIAGNOSIS — I2510 Atherosclerotic heart disease of native coronary artery without angina pectoris: Secondary | ICD-10-CM | POA: Diagnosis not present

## 2022-04-19 DIAGNOSIS — I10 Essential (primary) hypertension: Secondary | ICD-10-CM | POA: Diagnosis not present

## 2022-04-19 DIAGNOSIS — R42 Dizziness and giddiness: Secondary | ICD-10-CM | POA: Diagnosis not present

## 2022-04-19 DIAGNOSIS — R001 Bradycardia, unspecified: Secondary | ICD-10-CM | POA: Diagnosis not present

## 2022-04-19 DIAGNOSIS — I959 Hypotension, unspecified: Secondary | ICD-10-CM | POA: Diagnosis not present

## 2022-04-20 LAB — BASIC METABOLIC PANEL
BUN/Creatinine Ratio: 21 (ref 12–28)
BUN: 16 mg/dL (ref 8–27)
CO2: 24 mmol/L (ref 20–29)
Calcium: 9.1 mg/dL (ref 8.7–10.3)
Chloride: 104 mmol/L (ref 96–106)
Creatinine, Ser: 0.77 mg/dL (ref 0.57–1.00)
Glucose: 96 mg/dL (ref 70–99)
Potassium: 4.4 mmol/L (ref 3.5–5.2)
Sodium: 141 mmol/L (ref 134–144)
eGFR: 77 mL/min/{1.73_m2} (ref >59)

## 2022-04-20 LAB — CBC
Hematocrit: 40.6 % (ref 34.0–46.6)
Hemoglobin: 13.8 g/dL (ref 11.1–15.9)
MCH: 32 pg (ref 26.6–33.0)
MCHC: 34 g/dL (ref 31.5–35.7)
MCV: 94 fL (ref 79–97)
Platelets: 172 10*3/uL (ref 150–450)
RBC: 4.31 x10E6/uL (ref 3.77–5.28)
RDW: 12.2 % (ref 11.7–15.4)
WBC: 6.3 10*3/uL (ref 3.4–10.8)

## 2022-04-20 LAB — MAGNESIUM: Magnesium: 2.2 mg/dL (ref 1.6–2.3)

## 2022-04-22 ENCOUNTER — Telehealth (HOSPITAL_BASED_OUTPATIENT_CLINIC_OR_DEPARTMENT_OTHER): Payer: Self-pay

## 2022-04-22 MED ORDER — ATORVASTATIN CALCIUM 10 MG PO TABS: 10.0000 mg | ORAL_TABLET | Freq: Every day | ORAL | 3 refills | Status: DC

## 2022-04-22 NOTE — Addendum Note (Signed)
Addended by: Gerald Stabs on: 04/22/2022 10:31 AM   Modules accepted: Orders

## 2022-04-22 NOTE — Telephone Encounter (Signed)
Atorvastatin '10mg'$  daily added back to patient med list.

## 2022-04-22 NOTE — Telephone Encounter (Addendum)
RN contacted pharmacy and was told the patient last picked up atorvastatin 3/23 for 90 days      ----- Message from Loel Dubonnet, NP sent at 04/19/2022 11:12 PM EDT ----- Can we call patient to see if she is still taking atorvastatin? Or call pharmacy to see which statin she last picked up. TY!

## 2022-04-23 ENCOUNTER — Telehealth (HOSPITAL_BASED_OUTPATIENT_CLINIC_OR_DEPARTMENT_OTHER): Payer: Self-pay

## 2022-04-23 NOTE — Telephone Encounter (Addendum)
Results called to patient who verbalizes understanding! Patient states that she had a covid shot last night and it feeling horrible. Wished the patient a speedy recovery.   ----- Message from Loel Dubonnet, NP sent at 04/22/2022  8:10 AM EDT ----- CBC with no evidence of anemia nor infection.  Normal kidney function, electrolytes. Good result! No significant abnormalities that would cause labile blood pressure.

## 2022-05-04 ENCOUNTER — Other Ambulatory Visit (HOSPITAL_BASED_OUTPATIENT_CLINIC_OR_DEPARTMENT_OTHER): Payer: Self-pay | Admitting: Cardiology

## 2022-05-06 NOTE — Telephone Encounter (Signed)
Rx(s) sent to pharmacy electronically.  

## 2022-05-15 ENCOUNTER — Encounter (HOSPITAL_BASED_OUTPATIENT_CLINIC_OR_DEPARTMENT_OTHER): Payer: Self-pay | Admitting: Family

## 2022-05-15 ENCOUNTER — Ambulatory Visit (INDEPENDENT_AMBULATORY_CARE_PROVIDER_SITE_OTHER): Payer: Medicare Other | Admitting: Family

## 2022-05-15 VITALS — BP 148/82 | HR 61 | Ht 59.0 in | Wt 111.7 lb

## 2022-05-15 DIAGNOSIS — F4321 Adjustment disorder with depressed mood: Secondary | ICD-10-CM

## 2022-05-15 DIAGNOSIS — I10 Essential (primary) hypertension: Secondary | ICD-10-CM

## 2022-05-15 DIAGNOSIS — I25118 Atherosclerotic heart disease of native coronary artery with other forms of angina pectoris: Secondary | ICD-10-CM

## 2022-05-15 DIAGNOSIS — E785 Hyperlipidemia, unspecified: Secondary | ICD-10-CM

## 2022-05-15 NOTE — Progress Notes (Signed)
Office Visit    Patient Name: [name] Date of Encounter: 05/15/2022  PCP:  Cassandria Anger, MD   [place]  Cardiologist:  Buford Dresser, MD  Advanced Practice Provider:  No care team member to display Electrophysiologist:  None   Chief Complaint    [name] is a 81 y.o. female with a hx of hypertension, CAD, HLD, PAF during hospitalization in 2017 presents today for blood pressure follow-up  Past Medical History    Past Medical History:  Diagnosis Date   Abdominal pain, unspecified site    ADVERSE REACTION TO MEDICATION 07/31/2009   ANEMIA-NOS 07/07/2007   Arthritis    Chest pain, unspecified    Chronic kidney disease    hx of kidney stone   COLONIC POLYPS, ADENOMATOUS, HX OF    Complication of anesthesia    Disturbance of skin sensation    DIVERTICULOSIS, COLON 02/23/2009   Family history of diabetes mellitus    Family history of malignant neoplasm of breast    FATIGUE 05/25/2009   Fever, unspecified    Heart murmur    hx of   HYPERLIPIDEMIA 07/07/2007   HYPERPARATHYROIDISM UNSPECIFIED 10/20/2007   HYPERTENSION 02/04/2008   Irritable bowel syndrome (IBS)    NEPHROLITHIASIS, HX OF 11/15/2008   OSTEOPOROSIS 01/01/2008   Other acquired absence of organ    parathyroidectomy   Pain in limb    PALPITATIONS, OCCASIONAL 07/12/2008   PARATHYROIDECTOMY 01/02/2009   PONV (postoperative nausea and vomiting)    SCOLIOSIS 05/25/2009   Seasonal allergies    Unspecified constipation    URINARY INCONTINENCE 07/07/2007   UTI'S, RECURRENT 11/20/2009   kimbrough    Past Surgical History:  Procedure Laterality Date   BUNIONECTOMY     CATARACT EXTRACTION, BILATERAL     COLONOSCOPY     EXTRACORPOREAL SHOCK WAVE LITHOTRIPSY Left 12/21/2020   Procedure: EXTRACORPOREAL SHOCK WAVE LITHOTRIPSY (ESWL);  Surgeon: Festus Aloe, MD;  Location: [facility];  Service: Urology;  Laterality: Left;   LAPAROSCOPIC APPENDECTOMY   01/19/2016   Procedure: APPENDECTOMY LAPAROSCOPIC with orifice of cecal polyp;  Surgeon: Leighton Ruff, MD;  Location: [facility];  Service: General;;   LEFT HEART CATH AND CORONARY ANGIOGRAPHY N/A 04/07/2017   Procedure: Left Heart Cath and Coronary Angiography;  Surgeon: Nelva Bush, MD;  Location: [facility];  Service: Cardiovascular;  Laterality: N/A;   PARATHYROIDECTOMY     Rt Superior open neck exploration   POLYPECTOMY     RIGHT OOPHORECTOMY     TONSILLECTOMY      Allergies  Allergies  Allergen Reactions   Anesthetics, Amide Other (See Comments)    Pt states she had hallucinations and HTN after procedure 01/19/16-she feels may have been reaction to anesthetic   Lisinopril Cough    History of Present Illness    [name] is a 81 y.o. female with a hx of HTN, CAD (nonobstructive by cath 2018), HLD, PAF during hospitalization 2017 last seen 04/17/22.   Prior patient of Dr. Saunders Revel.  Established with Dr. Harrell Gave 07/19/2019.  Last seen 08/2021 noting persistent cough, low stamina, shortness of breath with mild activity.  She was noted to have labile blood pressure with overall average in the 150s.  She was to continue losartan 25 mg twice daily and recommended to trial taking her amlodipine in the morning versus evening to see which she tolerated best.  Seen 04/17/2022.  She was instructed to hold her losartan if  BP less than 110.  Lab work including CBC, BMP, magnesium unremarkable.  Orthostatic precautions discussed.  She presents today for follow up.  She notes episodes of shivering. If she is walking notes her right foot will tremble described as a tremor. Not associated with pain. No numbness.  Encouraged to discuss with her primary care provider. Notes mild lower extremity edema but does wear compression stockings.  It does improve with elevation.  She does note that she has been drinking Gatorade as a nurse at [facility] told her she was dehydrated-reassurance provided that  her last lab work did not show dehydration and that Gatorade would likely contribute to swelling, elevated blood pressure.   EKGs/Labs/Other Studies Reviewed:   The following studies were reviewed today:   EKG:  No EKG today.   Recent Labs: 04/19/2022: BUN 16; Creatinine, Ser 0.77; Hemoglobin 13.8; Magnesium 2.2; Platelets 172; Potassium 4.4; Sodium 141  Recent Lipid Panel    Component Value Date/Time   CHOL 132 01/25/2015 1112   TRIG 64 01/25/2015 1112   HDL 68 01/25/2015 1112   CHOLHDL 1.9 01/25/2015 1112   VLDL 13 01/25/2015 1112   LDLCALC 51 01/25/2015 1112   LDLDIRECT 156.3 03/28/2011 1038    Home Medications   Current Meds  Medication Sig   acetaminophen (TYLENOL) 500 MG tablet Take 500 mg by mouth every 4 (four) hours as needed.   amLODipine (NORVASC) 2.5 MG tablet Take 2.5 mg by mouth in the morning.   atorvastatin (LIPITOR) 10 MG tablet Take 1 tablet (10 mg total) by mouth daily.   b complex vitamins capsule Take 1 capsule by mouth daily.   Calcium-Magnesium-Vitamin D 600-40-500 MG-MG-UNIT TB24 Take 1 tablet by mouth daily at 6 PM.   Cholecalciferol (VITAMIN D) 50 MCG (2000 UT) CAPS Take 1 capsule by mouth daily.   denosumab (PROLIA) 60 MG/ML SOSY injection Inject 60 mg into the skin every 6 (six) months.   ipratropium (ATROVENT) 0.06 % nasal spray Place 2 sprays into the nose 3 (three) times daily.   losartan (COZAAR) 50 MG tablet Take 1 tablets(50 mg) at bedtime and 1/2 tablets(25 mg) in the morning   [DISCONTINUED] amLODipine (NORVASC) 2.5 MG tablet Take 1 tablet (2.5 mg total) by mouth at bedtime. (Patient taking differently: Take 2.5 mg by mouth daily. 1/2 tablet a day)     Review of Systems      All other systems reviewed and are otherwise negative except as noted above.  Physical Exam    VS:  BP (!) 148/82 (BP Location: Left Arm, Patient Position: Sitting, Cuff Size: Normal)   Pulse 61   Ht '4\' 11"'$  (1.499 m)   Wt 111 lb 11.2 oz (50.7 kg)   SpO2 94%    BMI 22.56 kg/m  , BMI Body mass index is 22.56 kg/m.  Wt Readings from Last 3 Encounters:  05/15/22 111 lb 11.2 oz (50.7 kg)  04/17/22 109 lb 1.6 oz (49.5 kg)  04/03/22 106 lb 12.8 oz (48.4 kg)     GEN: Well nourished, well developed, in no acute distress. HEENT: normal. Neck: Supple, no JVD, carotid bruits, or masses. Cardiac: RRR, no murmurs, rubs, or gallops. No clubbing, cyanosis, edema.  Radials/PT 2+ and equal bilaterally.  Respiratory:  Respirations regular and unlabored, clear to auscultation bilaterally. GI: Soft, nontender, nondistended. MS: No deformity or atrophy. Skin: Warm and dry, no rash. Neuro:  Strength and sensation are intact. Psych: Normal affect.  Assessment & Plan    CAD /  HLD - Minimal CAD by cath 2018. No BB due to bradycardia. Atorvastatin has fallen off med list since last visit - noted after visit - will call patient to inquire if she is still taking. LDL goal <70. Heart healthy diet and regular cardiovascular exercise encouraged.    Grief - Regarding prior passing of her husband. Condolences offered. Referred to psychology.   HTN - BP remains labile. Episodes of hypotension at home but also hypertension. Concern for element of orthostasis - precautions discussed. Difficult to ascertain trend as she did not bring log of BP today nor her BP cuff. Check BP twice per day and call to report in 1 week. Continue Losartan '25mg'$  BID. If SBP <110, hold Losartan.  Continue Amlodipine QAM.  Disposition: Follow up in 4 months with Buford Dresser, MD or APP.  Signed, Loel Dubonnet, NP 05/15/2022, 2:27 PM [place]

## 2022-05-15 NOTE — Patient Instructions (Addendum)
Medication Instructions:   The current medical regimen is effective;  continue present plan and medications.  *If you need a refill on your cardiac medications before your next appointment, please call your pharmacy*  Lab Work: None ordered today.   Testing/Procedures: None ordered today.    Follow-Up: At [facility], you and your health needs are our priority.  As part of our continuing mission to provide you with exceptional heart care, we have created designated Provider Care Teams.  These Care Teams include your primary Cardiologist (physician) and Advanced Practice Providers (APPs -  Physician Assistants and Nurse Practitioners) who all work together to provide you with the care you need, when you need it.  We recommend signing up for the patient portal called "MyChart".  Sign up information is provided on this After Visit Summary.  MyChart is used to connect with patients for Virtual Visits (Telemedicine).  Patients are able to view lab/test results, encounter notes, upcoming appointments, etc.  Non-urgent messages can be sent to your provider as well.   To learn more about what you can do with MyChart, go to [email or web address].    Your next appointment:   4 month(s)  The format for your next appointment:   In Person  Provider:   Buford Dresser, MD or Loel Dubonnet, NP     Other Instructions  You have been referred to psychology today. Mental health is an important part of heart health. You have been referred to Dr. Elias Else. He is located at [facility] Primary care at [facility]. They will contact you to schedule an appointment. If you have not heard from them in 1 week, you may call [phone] to schedule an appointment.    Check BP twice per day on the log provided.

## 2022-05-22 ENCOUNTER — Encounter (HOSPITAL_BASED_OUTPATIENT_CLINIC_OR_DEPARTMENT_OTHER): Payer: Self-pay

## 2022-05-31 IMAGING — CT CT HEAD W/O CM
1 series · 15 of 30 positions shown, 19 images · non-contrast
Comparison: Head CT 12/18/2017.

CLINICAL DATA: Abnormality of gait. Additional history provided by
scanning technologist: Patient reports elevated blood pressure,
right-sided weakness, symptoms for 1 week.

EXAM:
CT HEAD WITHOUT CONTRAST
TECHNIQUE: Contiguous axial images were obtained from the base of the skull
through the vertex without intravenous contrast.

[Series 2: head w/(date) · axial · 0.44mm/px · z∈[-158,-14]mm · 15 of 33 slices shown, 19 images]
[im 2/33  brain]
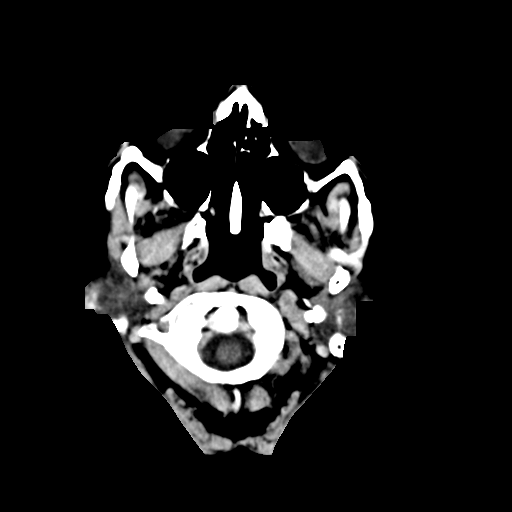
[im 2/33  bone]
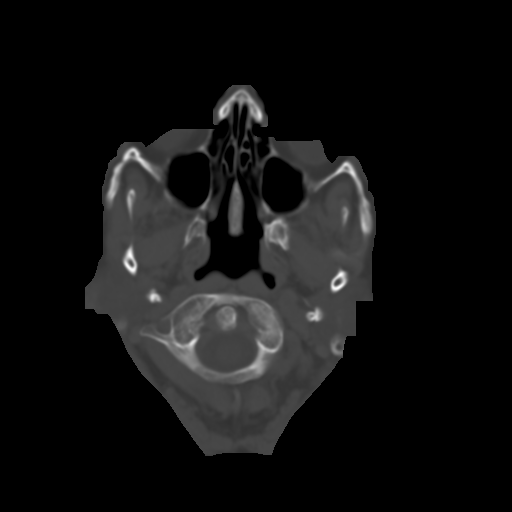
[im 4/33  brain]
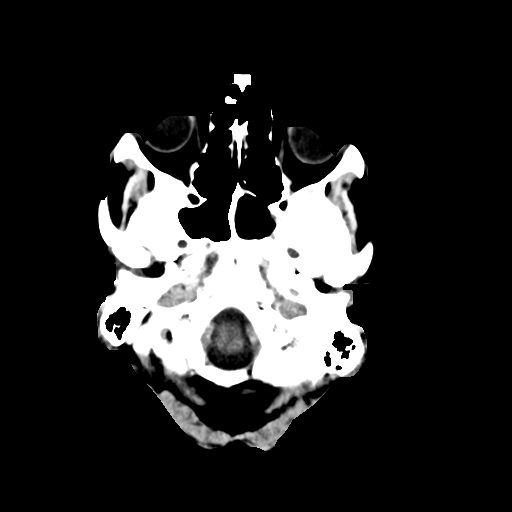
[im 6/33  brain]
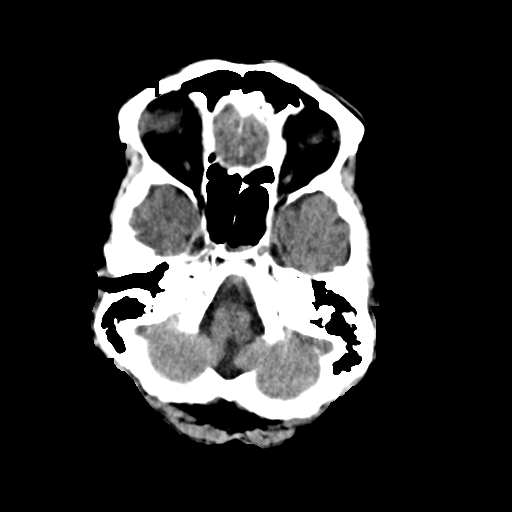
[im 8/33  brain]
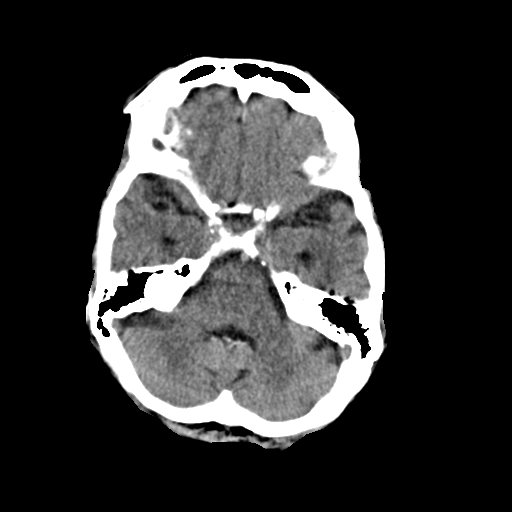
[im 10/33  brain]
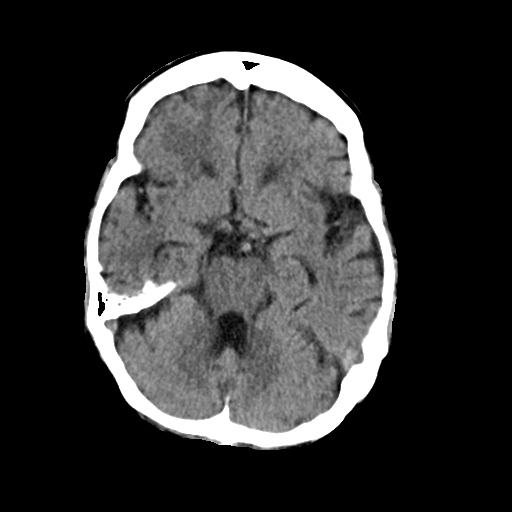
[im 10/33  bone]
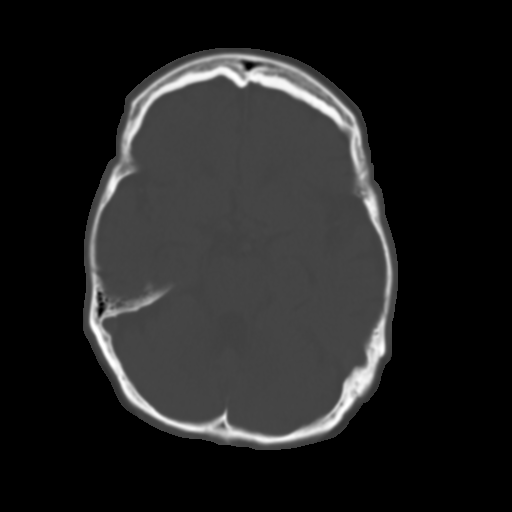
[im 13/33  brain]
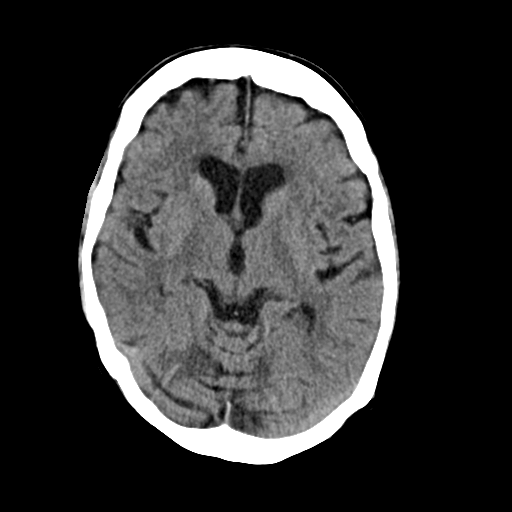
[im 15/33  brain]
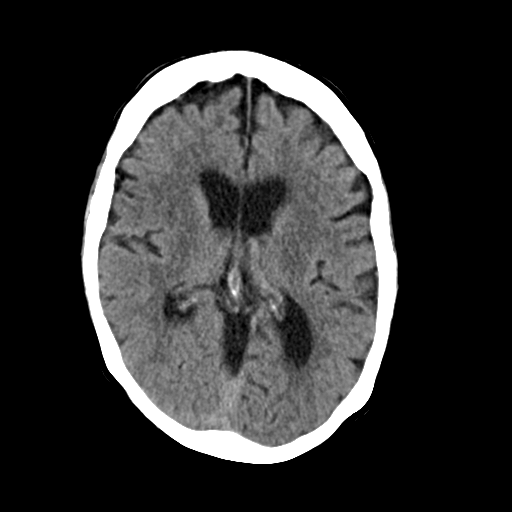
[im 17/33  brain]
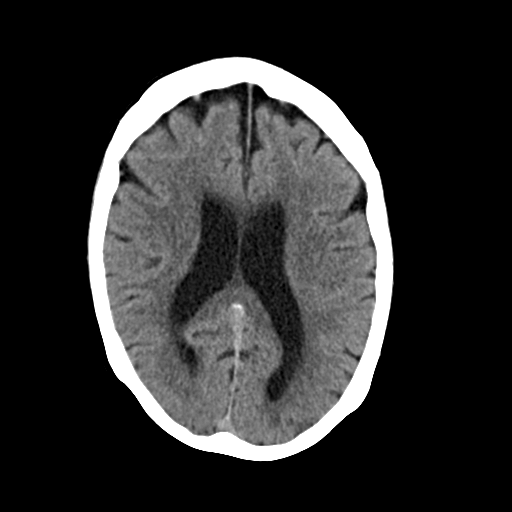
[im 18/33  brain]
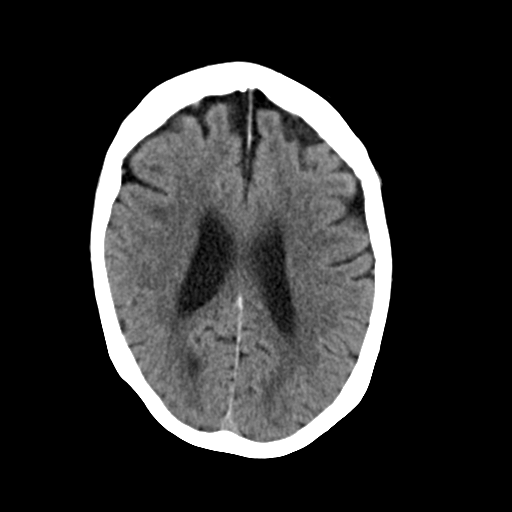
[im 18/33  bone]
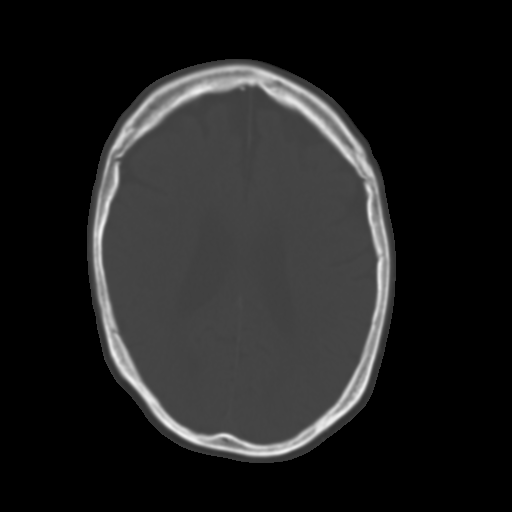
[im 20/33  brain]
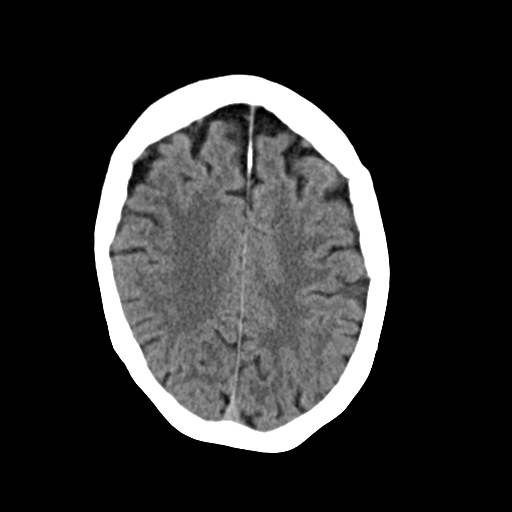
[im 23/33  brain]
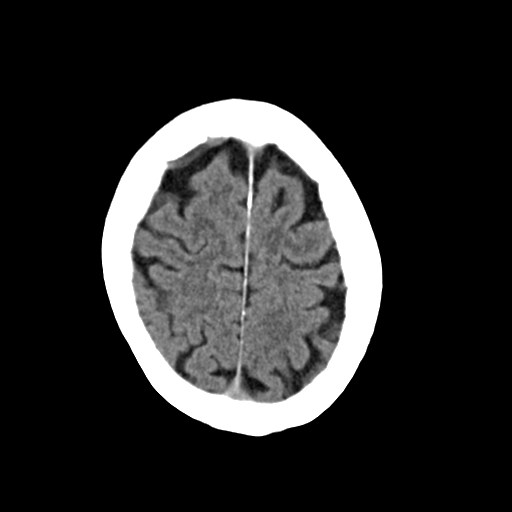
[im 25/33  brain]
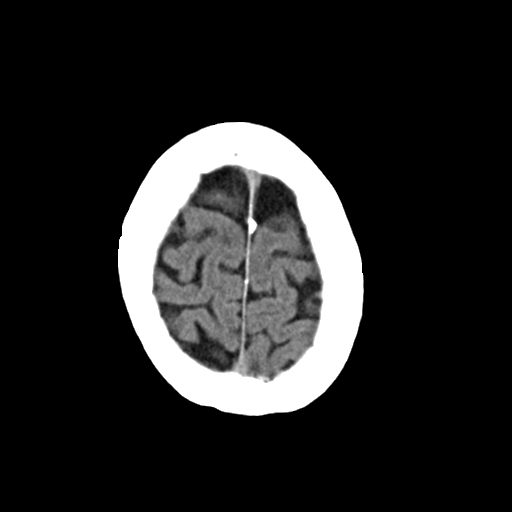
[im 27/33  brain]
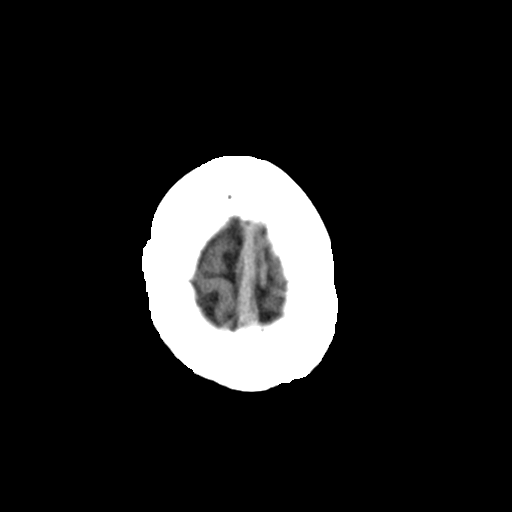
[im 27/33  bone]
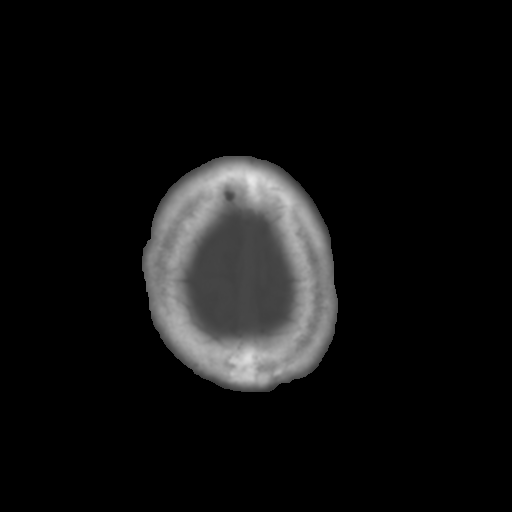
[im 29/33  brain]
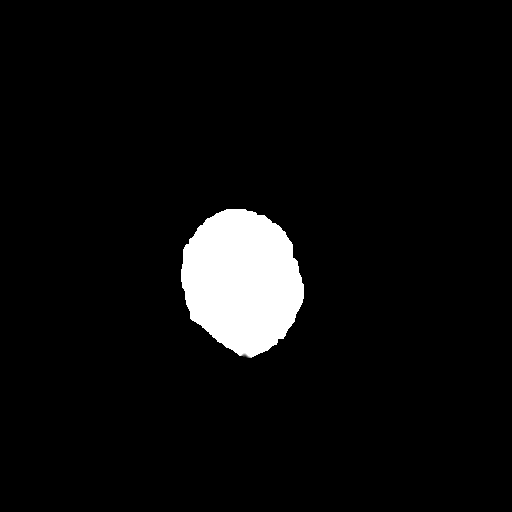
[im 31/33  brain]
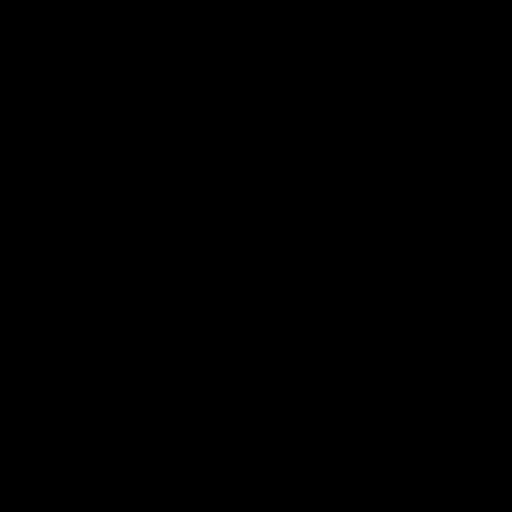

[15 of 30 positions shown; findings below may reference images not displayed]

FINDINGS: Brain:

Mild cerebral and cerebellar atrophy.

Mild ill-defined hypoattenuation within the cerebral white matter is
nonspecific, but compatible with chronic small vessel ischemic
disease.

There is no acute intracranial hemorrhage.

No demarcated cortical infarct.

No extra-axial fluid collection.

No evidence of intracranial mass.

No midline shift.

Vascular: No hyperdense vessel.  Atherosclerotic calcifications.

Skull: Normal. Negative for fracture or focal lesion.

Sinuses/Orbits: Visualized orbits show no acute finding. Mild
mucosal thickening and small volume frothy secretions within the
left sphenoid sinus.
IMPRESSION: No evidence of acute intracranial abnormality.

Mild generalized atrophy of the brain and cerebral white matter
chronic small vessel ischemic disease, stable as compared to the
head CT of 12/18/2017.

Left sphenoid sinusitis.

## 2022-06-06 IMAGING — DX DG ABDOMEN 1V
1 series · 1 of 1 positions shown · non-contrast
Comparison: CT 12/15/2020

CLINICAL DATA: Preop lithotripsy

EXAM:
ABDOMEN - 1 VIEW

[abdomen kub]
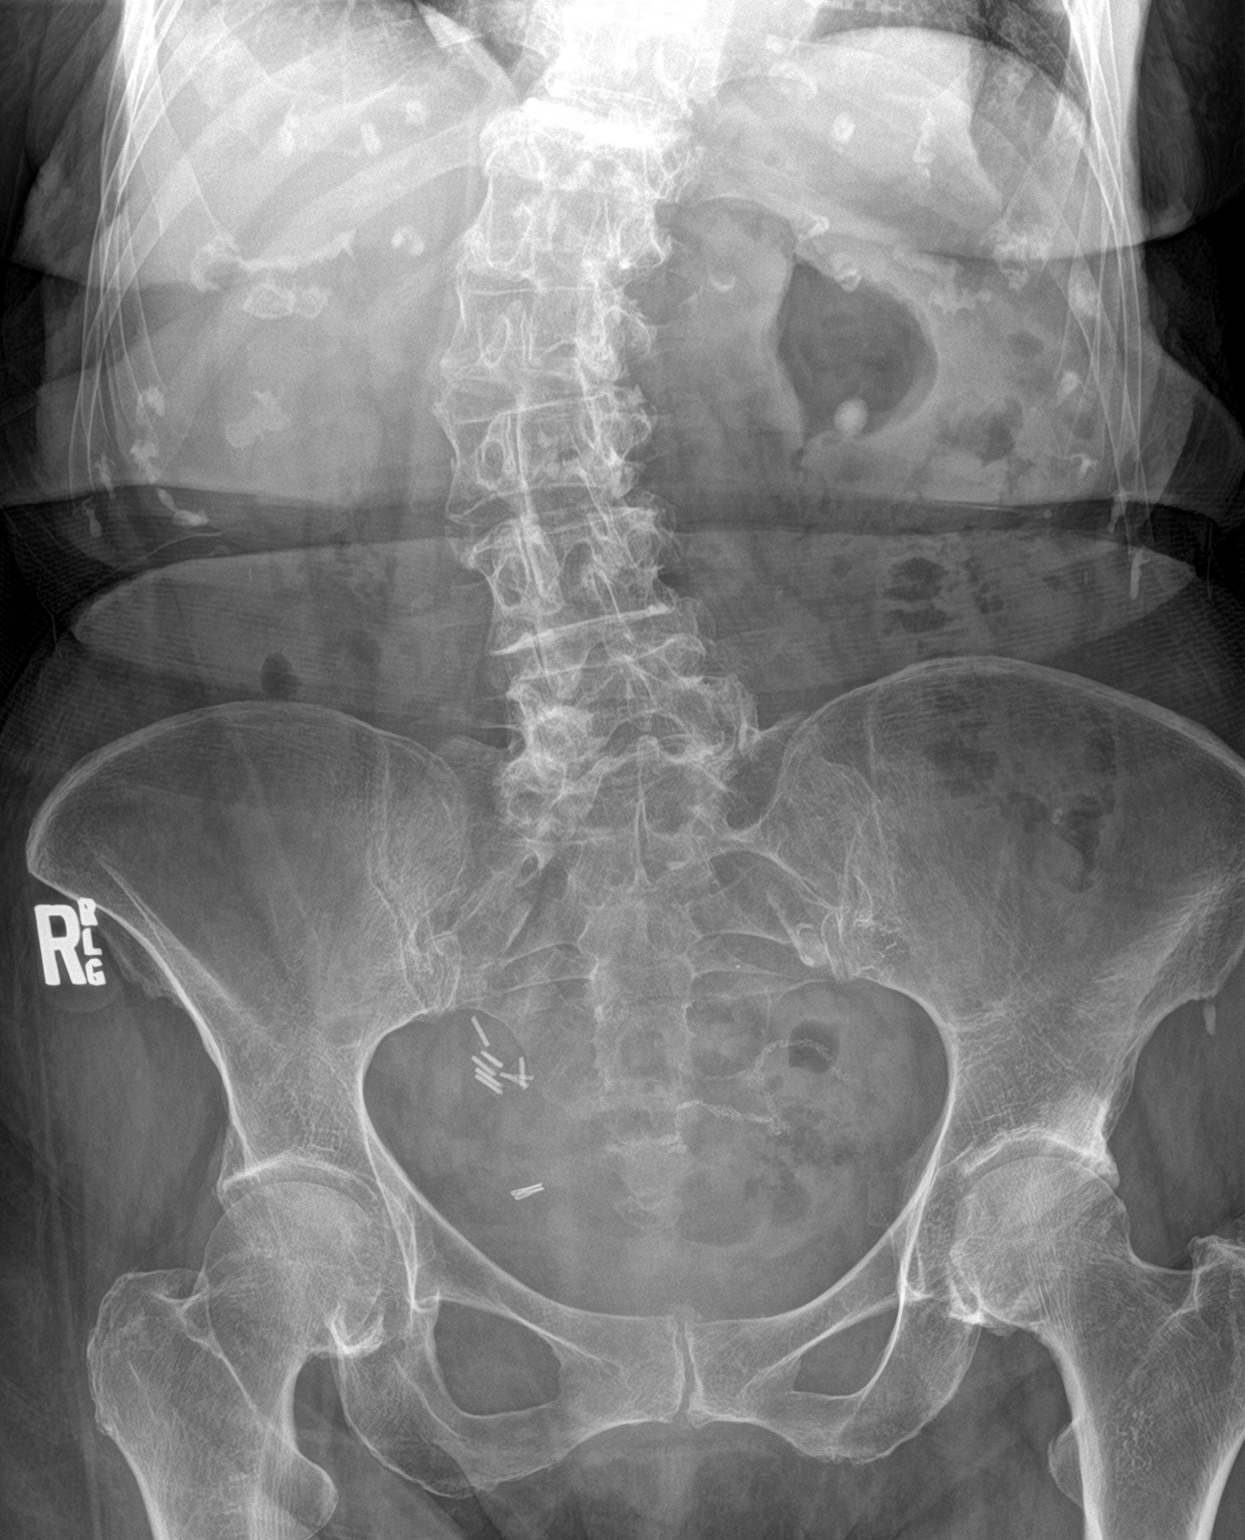

[1 of 1 positions shown; findings below may reference images not displayed]

FINDINGS: 10 mm calcification projects over the mid pole of the left kidney.
This appears located more lateral than on prior CT and may have
migrated back into the left kidney. Nonobstructive bowel gas
pattern. No organomegaly or free air.
IMPRESSION: 10 mm calcification in the left abdomen appears more laterally
located than on prior CT and may have migrated back into the left
kidney.

## 2022-06-11 ENCOUNTER — Encounter: Payer: Self-pay | Admitting: Internal Medicine

## 2022-06-11 ENCOUNTER — Encounter (HOSPITAL_BASED_OUTPATIENT_CLINIC_OR_DEPARTMENT_OTHER): Payer: Self-pay

## 2022-06-11 ENCOUNTER — Ambulatory Visit (INDEPENDENT_AMBULATORY_CARE_PROVIDER_SITE_OTHER): Payer: Medicare Other | Admitting: Internal Medicine

## 2022-06-11 VITALS — BP 110/60 | HR 61 | Temp 98.3°F | Ht 59.0 in | Wt 109.0 lb

## 2022-06-11 DIAGNOSIS — R0989 Other specified symptoms and signs involving the circulatory and respiratory systems: Secondary | ICD-10-CM | POA: Diagnosis not present

## 2022-06-11 DIAGNOSIS — I25118 Atherosclerotic heart disease of native coronary artery with other forms of angina pectoris: Secondary | ICD-10-CM

## 2022-06-11 NOTE — Progress Notes (Signed)
Patient ID: [name], female   DOB: 1941/01/22, 81 y.o.   MRN: [id number]        Chief Complaint: follow up labile htn       HPI:  [name] is a 81 y.o. female here with c/o high and low bp with the lows associated with weakness, shivers, muscle cramps.  Most recent Bps have ranged between 160 (first thing in the am) and daytime 110s with the 110 seem consistently to be associated with the symptoms.  Has seen cardiology and meds recently adjusted July 5, and on careful review today, pt appears to be taking the opposite regimen intended per cards - pt is currently taking losartan 50 mg qam, and taking losartan 25 mg with amlodipine 2.5 mg in the evening before bed.  I advised pt she was intended to take the opposite with the losartan 50 in pm (to help with am BP) then qam 25 mg to lessen daytime BPs and avoid 110s during the day.  Amlodipine is for admin in the am as well.   Chart review also indicates some phone outreach to pt by cardiology that was unsuccessful, so pt willing to check BP on new regimen and f/u there as intended       Wt Readings from Last 3 Encounters:  06/11/22 109 lb (49.4 kg)  05/15/22 111 lb 11.2 oz (50.7 kg)  04/17/22 109 lb 1.6 oz (49.5 kg)   BP Readings from Last 3 Encounters:  06/11/22 110/60  05/15/22 (!) 148/82  04/17/22 110/70         Past Medical History:  Diagnosis Date   Abdominal pain, unspecified site    ADVERSE REACTION TO MEDICATION 07/31/2009   ANEMIA-NOS 07/07/2007   Arthritis    Chest pain, unspecified    Chronic kidney disease    hx of kidney stone   COLONIC POLYPS, ADENOMATOUS, HX OF    Complication of anesthesia    Disturbance of skin sensation    DIVERTICULOSIS, COLON 02/23/2009   Family history of diabetes mellitus    Family history of malignant neoplasm of breast    FATIGUE 05/25/2009   Fever, unspecified    Heart murmur    hx of   HYPERLIPIDEMIA 07/07/2007   HYPERPARATHYROIDISM UNSPECIFIED 10/20/2007   HYPERTENSION 02/04/2008    Irritable bowel syndrome (IBS)    NEPHROLITHIASIS, HX OF 11/15/2008   OSTEOPOROSIS 01/01/2008   Other acquired absence of organ    parathyroidectomy   Pain in limb    PALPITATIONS, OCCASIONAL 07/12/2008   PARATHYROIDECTOMY 01/02/2009   PONV (postoperative nausea and vomiting)    SCOLIOSIS 05/25/2009   Seasonal allergies    Unspecified constipation    URINARY INCONTINENCE 07/07/2007   UTI'S, RECURRENT 11/20/2009   kimbrough    Past Surgical History:  Procedure Laterality Date   BUNIONECTOMY     CATARACT EXTRACTION, BILATERAL     COLONOSCOPY     EXTRACORPOREAL SHOCK WAVE LITHOTRIPSY Left 12/21/2020   Procedure: EXTRACORPOREAL SHOCK WAVE LITHOTRIPSY (ESWL);  Surgeon: Festus Aloe, MD;  Location: [facility];  Service: Urology;  Laterality: Left;   LAPAROSCOPIC APPENDECTOMY  01/19/2016   Procedure: APPENDECTOMY LAPAROSCOPIC with orifice of cecal polyp;  Surgeon: Leighton Ruff, MD;  Location: [facility];  Service: General;;   LEFT HEART CATH AND CORONARY ANGIOGRAPHY N/A 04/07/2017   Procedure: Left Heart Cath and Coronary Angiography;  Surgeon: Nelva Bush, MD;  Location: [place] [facility];  Service: Cardiovascular;  Laterality: N/A;   PARATHYROIDECTOMY  Rt Superior open neck exploration   POLYPECTOMY     RIGHT OOPHORECTOMY     TONSILLECTOMY      reports that she has never smoked. She has never used smokeless tobacco. She reports current alcohol use. She reports that she does not use drugs. family history includes Breast cancer in her paternal aunt; Diabetes in an other family member; Heart disease in her mother; Heart disease (age of onset: 97) in her father; Sudden death (age of onset: 49) in her father. Allergies  Allergen Reactions   Anesthetics, Amide Other (See Comments)    Pt states she had hallucinations and HTN after procedure 01/19/16-she feels may have been reaction to anesthetic   Lisinopril Cough   Current Outpatient Medications on File Prior to Visit   Medication Sig Dispense Refill   acetaminophen (TYLENOL) 500 MG tablet Take 500 mg by mouth every 4 (four) hours as needed.     amLODipine (NORVASC) 2.5 MG tablet Take 2.5 mg by mouth in the morning.     atorvastatin (LIPITOR) 10 MG tablet Take 1 tablet (10 mg total) by mouth daily. 90 tablet 3   b complex vitamins capsule Take 1 capsule by mouth daily.     Calcium-Magnesium-Vitamin D 600-40-500 MG-MG-UNIT TB24 Take 1 tablet by mouth daily at 6 PM.     Cholecalciferol (VITAMIN D) 50 MCG (2000 UT) CAPS Take 1 capsule by mouth daily.     denosumab (PROLIA) 60 MG/ML SOSY injection Inject 60 mg into the skin every 6 (six) months.     ipratropium (ATROVENT) 0.06 % nasal spray Place 2 sprays into the nose 3 (three) times daily. 15 mL 2   losartan (COZAAR) 50 MG tablet Take 1 tablets(50 mg) at bedtime and 1/2 tablets(25 mg) in the morning     No current facility-administered medications on file prior to visit.        ROS:  All others reviewed and negative.  Objective        PE:  BP 110/60 (BP Location: Right Arm, Patient Position: Sitting, Cuff Size: Normal)   Pulse 61   Temp 98.3 F (36.8 C) (Oral)   Ht '4\' 11"'$  (1.499 m)   Wt 109 lb (49.4 kg)   SpO2 97%   BMI 22.02 kg/m                 Constitutional: Pt appears in NAD               HENT: Head: NCAT.                Right Ear: External ear normal.                 Left Ear: External ear normal.                Eyes: . Pupils are equal, round, and reactive to light. Conjunctivae and EOM are normal               Nose: without d/c or deformity               Neck: Neck supple. Gross normal ROM               Cardiovascular: Normal rate and regular rhythm.                 Pulmonary/Chest: Effort normal and breath sounds without rales or wheezing.                Abd:  Soft, NT, ND, + BS, no organomegaly               Neurological: Pt is alert. At baseline orientation, motor grossly intact               Skin: Skin is warm. No rashes, no other  new lesions, LE edema - none               Psychiatric: Pt behavior is normal without agitation   Micro: none  Cardiac tracings I have personally interpreted today:  none  Pertinent Radiological findings (summarize): none   Lab Results  Component Value Date   WBC 6.3 04/19/2022   HGB 13.8 04/19/2022   HCT 40.6 04/19/2022   PLT 172 04/19/2022   GLUCOSE 96 04/19/2022   CHOL 132 01/25/2015   TRIG 64 01/25/2015   HDL 68 01/25/2015   LDLDIRECT 156.3 03/28/2011   LDLCALC 51 01/25/2015   ALT 18 12/18/2017   AST 29 12/18/2017   NA 141 04/19/2022   K 4.4 04/19/2022   CL 104 04/19/2022   CREATININE 0.77 04/19/2022   BUN 16 04/19/2022   CO2 24 04/19/2022   TSH 1.42 01/30/2021   INR 1.0 04/01/2017   Assessment/Plan:  DESREE LEAP is a 81 y.o. White or Caucasian [1] female with  has a past medical history of Abdominal pain, unspecified site, ADVERSE REACTION TO MEDICATION (07/31/2009), ANEMIA-NOS (07/07/2007), Arthritis, Chest pain, unspecified, Chronic kidney disease, COLONIC POLYPS, ADENOMATOUS, HX OF, Complication of anesthesia, Disturbance of skin sensation, DIVERTICULOSIS, COLON (02/23/2009), Family history of diabetes mellitus, Family history of malignant neoplasm of breast, FATIGUE (05/25/2009), Fever, unspecified, Heart murmur, HYPERLIPIDEMIA (07/07/2007), HYPERPARATHYROIDISM UNSPECIFIED (10/20/2007), HYPERTENSION (02/04/2008), Irritable bowel syndrome (IBS), NEPHROLITHIASIS, HX OF (11/15/2008), OSTEOPOROSIS (01/01/2008), Other acquired absence of organ, Pain in limb, PALPITATIONS, OCCASIONAL (07/12/2008), PARATHYROIDECTOMY (01/02/2009), PONV (postoperative nausea and vomiting), SCOLIOSIS (05/25/2009), Seasonal allergies, Unspecified constipation, URINARY INCONTINENCE (07/07/2007), and UTI'S, RECURRENT (11/20/2009).  Labile blood pressure Long d/w pt the intended med regimen, for which she appears a bit overwhelmed but I think understands as she leaves, just does not appear to have high hopes this  will work as intended; pt to continue check BP tid and f/u with results to cardiology as requested  Followup: Return if symptoms worsen or fail to improve.  Cathlean Cower, MD 06/13/2022 8:48 PM [facility]

## 2022-06-11 NOTE — Patient Instructions (Signed)
We determined today that you are not taking your medications as was intended at your last cardiology visit -   Please change the losartan to taking 50 mg at bedtime, then take 25 mg (half pill) in the AM  (not the other way around)  Please also take the Amlodipine 2.5 mg in the AM as intended as well  Please check your BP three times daily for 1 week, and let the Cardiology office know the readings  Please continue all other medications as before, and refills have been done if requested.  Please have the pharmacy call with any other refills you may need.  Please keep your appointments with your specialists as you may have planned

## 2022-06-12 NOTE — Telephone Encounter (Signed)
Please advise 

## 2022-06-13 ENCOUNTER — Encounter: Payer: Self-pay | Admitting: Internal Medicine

## 2022-06-13 NOTE — Assessment & Plan Note (Addendum)
Long d/w pt the intended med regimen, for which she appears a bit overwhelmed but I think understands as she leaves, just does not appear to have high hopes this will work as intended; pt to continue check BP tid and f/u with results to cardiology as requested

## 2022-06-14 ENCOUNTER — Other Ambulatory Visit: Payer: Self-pay | Admitting: Cardiology

## 2022-06-14 DIAGNOSIS — I10 Essential (primary) hypertension: Secondary | ICD-10-CM

## 2022-06-14 NOTE — Telephone Encounter (Signed)
Rx(s) sent to pharmacy electronically.  

## 2022-06-26 ENCOUNTER — Ambulatory Visit: Payer: Medicare Other | Admitting: Psychologist

## 2022-06-27 DIAGNOSIS — M79661 Pain in right lower leg: Secondary | ICD-10-CM | POA: Diagnosis not present

## 2022-06-27 DIAGNOSIS — M25551 Pain in right hip: Secondary | ICD-10-CM | POA: Diagnosis not present

## 2022-06-27 DIAGNOSIS — M48061 Spinal stenosis, lumbar region without neurogenic claudication: Secondary | ICD-10-CM | POA: Diagnosis not present

## 2022-06-27 DIAGNOSIS — M4120 Other idiopathic scoliosis, site unspecified: Secondary | ICD-10-CM | POA: Diagnosis not present

## 2022-06-27 DIAGNOSIS — M810 Age-related osteoporosis without current pathological fracture: Secondary | ICD-10-CM | POA: Diagnosis not present

## 2022-07-01 DIAGNOSIS — M545 Low back pain, unspecified: Secondary | ICD-10-CM | POA: Diagnosis not present

## 2022-07-08 ENCOUNTER — Ambulatory Visit: Payer: Self-pay | Admitting: Licensed Clinical Social Worker

## 2022-07-08 NOTE — Patient Instructions (Addendum)
Visit Information  Thank you for taking time to visit with me today. Please don't hesitate to contact me if I can be of assistance to you before our next scheduled telephone appointment.  Following are the goals we discussed today:   No further intervention needed  Please call the care guide team at [phone] if you need to cancel or reschedule your appointment.   If you are experiencing a Mental Health or [place] or need someone to talk to, please go to [facility] [phone])   Following is a copy of your full plan of care:   Care Coordination Interventions:  Active listening / Reflection utilized  Informed client about Care Coordination program support Client said she resides at [facility]. She said her needs are met through the staff at [facility]. She declined program support at this time   Ms. Digiulio was given information about Care Management services by the embedded care coordination team including:  Care Management services include personalized support from designated clinical staff supervised by her physician, including individualized plan of care and coordination with other care providers 24/7 contact phone numbers for assistance for urgent and routine care needs. The patient may stop CCM services at any time (effective at the end of the month) by phone call to the office staff.  Patient agreed to services and verbal consent obtained.    Norva Riffle.Torey Reinard MSW, [employer] representative [facility] Care Management [phone]

## 2022-07-08 NOTE — Patient Outreach (Signed)
  Care Coordination   Initial Visit Note   07/08/2022 Name: LANAYAH GARTLEY MRN: [id number] DOB: 02-19-1941  SYLWIA CUERVO is a 81 y.o. year old female who sees Plotnikov, Evie Lacks, MD for primary care. I spoke with  Esaw Dace by phone today  What matters to the patients health and wellness today?  Patient declined program services.    Goals Addressed             This Visit's Progress    Patient Stated she resident at [place]. Her needs are met by staff at [facility]. Declined program services       Care Coordination Interventions:  Active listening / Reflection utilized  Informed client about Care Coordination program support Client said she resides at [facility]. She said her needs are met through the staff at [facility]. She declined program support at this time      SDOH assessments and interventions completed:  No     Care Coordination Interventions Activated:  No  Care Coordination Interventions:  No, not indicated   Follow up plan: No further intervention required.   Encounter Outcome:  Pt. Visit Completed

## 2022-07-18 ENCOUNTER — Ambulatory Visit (INDEPENDENT_AMBULATORY_CARE_PROVIDER_SITE_OTHER): Payer: Medicare Other | Admitting: Obstetrics & Gynecology

## 2022-07-18 ENCOUNTER — Encounter (HOSPITAL_BASED_OUTPATIENT_CLINIC_OR_DEPARTMENT_OTHER): Payer: Self-pay | Admitting: Obstetrics & Gynecology

## 2022-07-18 VITALS — BP 139/66 | HR 57 | Ht 60.0 in | Wt 111.2 lb

## 2022-07-18 DIAGNOSIS — I25118 Atherosclerotic heart disease of native coronary artery with other forms of angina pectoris: Secondary | ICD-10-CM

## 2022-07-18 DIAGNOSIS — Z780 Asymptomatic menopausal state: Secondary | ICD-10-CM

## 2022-07-18 NOTE — Progress Notes (Signed)
81 y.o. G53P0 Widowed White or Caucasian female here for new patient exam.  Has a few friends who are my patients and they recommended she come for appointment.  Desires gyn exam.  Denies vaginal bleeding.  No sure if she needs pap smear.  Not on HRT.  Denies vaginal discharge.  No hx of abnormal pap smears.  Is still getting mammograms.  Last was 08/2021.  Biggest complaint is fatigue and intermittent sensation of shivering.  These are not typically associated with gynecological issues so discussed but no additional evaluation recommendations made for these.  No LMP recorded. Patient is postmenopausal.            reports that she has never smoked. She has never used smokeless tobacco. She reports current alcohol use. She reports that she does not use drugs.  Past Medical History:  Diagnosis Date   Abdominal pain, unspecified site    ADVERSE REACTION TO MEDICATION 07/31/2009   ANEMIA-NOS 07/07/2007   Arthritis    Chest pain, unspecified    Chronic kidney disease    hx of kidney stone   COLONIC POLYPS, ADENOMATOUS, HX OF    Complication of anesthesia    Disturbance of skin sensation    DIVERTICULOSIS, COLON 02/23/2009   Family history of diabetes mellitus    Family history of malignant neoplasm of breast    FATIGUE 05/25/2009   Fever, unspecified    Heart murmur    hx of   HYPERLIPIDEMIA 07/07/2007   HYPERPARATHYROIDISM UNSPECIFIED 10/20/2007   HYPERTENSION 02/04/2008   Irritable bowel syndrome (IBS)    NEPHROLITHIASIS, HX OF 11/15/2008   OSTEOPOROSIS 01/01/2008   Other acquired absence of organ    parathyroidectomy   Pain in limb    PALPITATIONS, OCCASIONAL 07/12/2008   PARATHYROIDECTOMY 01/02/2009   PONV (postoperative nausea and vomiting)    SCOLIOSIS 05/25/2009   Seasonal allergies    Unspecified constipation    URINARY INCONTINENCE 07/07/2007   UTI'S, RECURRENT 11/20/2009   kimbrough     Past Surgical History:  Procedure Laterality Date   BUNIONECTOMY     CATARACT EXTRACTION,  BILATERAL     COLONOSCOPY     EXTRACORPOREAL SHOCK WAVE LITHOTRIPSY Left 12/21/2020   Procedure: EXTRACORPOREAL SHOCK WAVE LITHOTRIPSY (ESWL);  Surgeon: Festus Aloe, MD;  Location: [facility];  Service: Urology;  Laterality: Left;   LAPAROSCOPIC APPENDECTOMY  01/19/2016   Procedure: APPENDECTOMY LAPAROSCOPIC with orifice of cecal polyp;  Surgeon: Leighton Ruff, MD;  [facility];  Service: General;;   LEFT HEART CATH AND CORONARY ANGIOGRAPHY N/A 04/07/2017   Procedure: Left Heart Cath and Coronary Angiography;  Surgeon: Nelva Bush, MD;  Location: [place] [facility];  Service: Cardiovascular;  Laterality: N/A;   PARATHYROIDECTOMY     Rt Superior open neck exploration   POLYPECTOMY     RIGHT OOPHORECTOMY     TONSILLECTOMY      Current Outpatient Medications  Medication Sig Dispense Refill   acetaminophen (TYLENOL) 500 MG tablet Take 500 mg by mouth every 4 (four) hours as needed.     amLODipine (NORVASC) 2.5 MG tablet Take 1 tablet (2.5 mg total) by mouth at bedtime. 90 tablet 3   atorvastatin (LIPITOR) 10 MG tablet Take 1 tablet (10 mg total) by mouth daily. 90 tablet 3   b complex vitamins capsule Take 1 capsule by mouth daily.     Calcium-Magnesium-Vitamin D 600-40-500 MG-MG-UNIT TB24 Take 1 tablet by mouth daily at 6 PM.     Cholecalciferol (  VITAMIN D) 50 MCG (2000 UT) CAPS Take 1 capsule by mouth daily.     denosumab (PROLIA) 60 MG/ML SOSY injection Inject 60 mg into the skin every 6 (six) months.     ipratropium (ATROVENT) 0.06 % nasal spray Place 2 sprays into the nose 3 (three) times daily. 15 mL 2   losartan (COZAAR) 50 MG tablet Take 1 tablets(50 mg) at bedtime and 1/2 tablets(25 mg) in the morning     No current facility-administered medications for this visit.    Family History  Problem Relation Age of Onset   Heart disease Mother        valve replacement   Sudden death Father 71   Heart disease Father 75       MI   Breast cancer Paternal Aunt     Diabetes Other        1st degree relative   Colon cancer Neg Hx    Stomach cancer Neg Hx    Esophageal cancer Neg Hx    Pancreatic cancer Neg Hx     Review of Systems  Constitutional:  Positive for fatigue.  Genitourinary: Negative.     Exam:   BP 139/66 (BP Location: Right Arm, Patient Position: Sitting, Cuff Size: Normal)   Pulse (!) 57   Ht 5' (1.524 m) Comment: Reported  Wt 111 lb 3.2 oz (50.4 kg)   BMI 21.72 kg/m   Height: 5' (152.4 cm) (Reported)  General appearance: alert, cooperative and appears stated age Breasts: normal appearance, no masses or tenderness Abdomen: soft, non-tender; bowel sounds normal; no masses,  no organomegaly Lymph nodes: Cervical, supraclavicular, and axillary nodes normal.  No abnormal inguinal nodes palpated Neurologic: Grossly normal  Pelvic: External genitalia:  no lesions              Urethra:  normal appearing urethra with no masses, tenderness or lesions              Bartholins and Skenes: normal                 Vagina: normal appearing vagina with atrophic changes and no discharge, no lesions              Cervix: no lesions              Pap taken: No. Bimanual Exam:  Uterus:  normal size, contour, position, consistency, mobility, non-tender              Adnexa: normal adnexa and no mass, fullness, tenderness  Chaperone, Octaviano Batty, CMA, was present for exam.  Assessment/Plan: 1. Postmenopausal -pap smear guidelines reviewed and not recommended.  As well, yearly gyn exams not recommended at this time.  Recommended evaluation for new abdominal pain, vaginal bleeding, any new masses, breast changes or nipple discharge.  Encouraged pt to be seen for new gyn concerns.

## 2022-07-29 DIAGNOSIS — M47816 Spondylosis without myelopathy or radiculopathy, lumbar region: Secondary | ICD-10-CM | POA: Diagnosis not present

## 2022-07-29 DIAGNOSIS — M419 Scoliosis, unspecified: Secondary | ICD-10-CM | POA: Diagnosis not present

## 2022-07-29 DIAGNOSIS — M48061 Spinal stenosis, lumbar region without neurogenic claudication: Secondary | ICD-10-CM | POA: Diagnosis not present

## 2022-07-30 ENCOUNTER — Ambulatory Visit: Payer: Medicare Other | Admitting: Psychologist

## 2022-08-11 NOTE — Therapy (Addendum)
OUTPATIENT PHYSICAL THERAPY THORACOLUMBAR EVALUATION   Patient Name: [name] MRN: [id number] DOB:08/18/1941, 81 y.o., female Today's Date: 08/13/2022   PT End of Session - 08/13/22 0827     Visit Number 1    Number of Visits 12    Date for PT Re-Evaluation 09/23/22    Authorization Type MCR A and B    PT Start Time 1111    PT Stop Time 1215    PT Time Calculation (min) 64 min    Activity Tolerance Patient tolerated treatment well    Behavior During Therapy WFL for tasks assessed/performed             Past Medical History:  Diagnosis Date   Abdominal pain, unspecified site    ADVERSE REACTION TO MEDICATION 07/31/2009   ANEMIA-NOS 07/07/2007   Arthritis    Chest pain, unspecified    Chronic kidney disease    hx of kidney stone   COLONIC POLYPS, ADENOMATOUS, HX OF    Complication of anesthesia    Disturbance of skin sensation    DIVERTICULOSIS, COLON 02/23/2009   Family history of diabetes mellitus    Family history of malignant neoplasm of breast    FATIGUE 05/25/2009   Fever, unspecified    Heart murmur    hx of   HYPERLIPIDEMIA 07/07/2007   HYPERPARATHYROIDISM UNSPECIFIED 10/20/2007   HYPERTENSION 02/04/2008   Irritable bowel syndrome (IBS)    NEPHROLITHIASIS, HX OF 11/15/2008   OSTEOPOROSIS 01/01/2008   Other acquired absence of organ    parathyroidectomy   Pain in limb    PALPITATIONS, OCCASIONAL 07/12/2008   PARATHYROIDECTOMY 01/02/2009   PONV (postoperative nausea and vomiting)    SCOLIOSIS 05/25/2009   Seasonal allergies    Unspecified constipation    URINARY INCONTINENCE 07/07/2007   UTI'S, RECURRENT 11/20/2009   kimbrough    Past Surgical History:  Procedure Laterality Date   BUNIONECTOMY     CATARACT EXTRACTION, BILATERAL     COLONOSCOPY     EXTRACORPOREAL SHOCK WAVE LITHOTRIPSY Left 12/21/2020   Procedure: EXTRACORPOREAL SHOCK WAVE LITHOTRIPSY (ESWL);  Surgeon: Festus Aloe, MD;  Location: [facility];  Service: Urology;   Laterality: Left;   LAPAROSCOPIC APPENDECTOMY  01/19/2016   Procedure: APPENDECTOMY LAPAROSCOPIC with orifice of cecal polyp;  Surgeon: Leighton Ruff, MD;  Location: [facility];  Service: General;;   LEFT HEART CATH AND CORONARY ANGIOGRAPHY N/A 04/07/2017   Procedure: Left Heart Cath and Coronary Angiography;  Surgeon: Nelva Bush, MD;  Location: [facility];  Service: Cardiovascular;  Laterality: N/A;   PARATHYROIDECTOMY     Rt Superior open neck exploration   POLYPECTOMY     RIGHT OOPHORECTOMY     TONSILLECTOMY     Patient Active Problem List   Diagnosis Date Noted   Other specified abnormal immunological findings in serum 04/03/2022   Pathological fracture of vertebra 04/03/2022   URI (upper respiratory infection) 04/03/2022   Allergic rhinitis 04/03/2022   Cough 04/03/2022   Anxiety disorder 06/12/2021   Unsteady gait when walking 01/19/2021   Diplopia 01/19/2021   Late onset Alzheimer's dementia without behavioral disturbance ([place]) 01/19/2021   Chronic idiopathic constipation 01/19/2021   Labile blood pressure 01/19/2021   Acute parotitis 03/09/2019   Coronary artery disease involving native coronary artery of native heart without angina pectoris 04/28/2017   Syncope 04/07/2017   Shortness of breath 04/07/2017   Abnormal stress test 04/07/2017   Sinus bradycardia 02/21/2017   Coronary artery calcification 02/21/2017  Compression fracture of body of thoracic vertebra (HCC) 02/08/2017   Dizziness and giddiness 02/08/2017   Atrial fibrillation with rapid ventricular response ([place]) 01/21/2016   Colon polyp s/p appy/partialcecetomy 01/19/3016 01/19/2016   Orthostatic dizziness 04/25/2014   Vertigo 12/26/2012   Hypertension, uncontrolled 65/99/3570   Diastolic dysfunction 17/79/3903   Sleep disturbance 09/17/2011   Hypotension 12/26/2010   Orthostatic hypotension 12/26/2010   UTI'S, RECURRENT 11/20/2009   ADVERSE REACTION TO MEDICATION 07/31/2009   SCOLIOSIS 05/25/2009    FATIGUE 05/25/2009   DIVERTICULOSIS, COLON 02/23/2009   COLONIC POLYPS, ADENOMATOUS, HX OF 02/23/2009   PARATHYROIDECTOMY 01/02/2009   NEPHROLITHIASIS, HX OF 11/15/2008   PALPITATIONS, OCCASIONAL 07/12/2008   Essential hypertension 02/04/2008   Constipation 01/01/2008   LEG PAIN, LEFT 01/01/2008   Osteoporosis 01/01/2008   Thoracic back pain 12/04/2007   HYPERPARATHYROIDISM UNSPECIFIED 10/20/2007   HYPERLIPIDEMIA 07/07/2007   ANEMIA-NOS 07/07/2007   URINARY INCONTINENCE 07/07/2007     REFERRING PROVIDER: Verner Chol, MD   REFERRING DIAG: lumbar scoliosis ? Spinal stenosis, B LE weakness, unsteady gait, and osteoporosis.  Rationale for Evaluation and Treatment Rehabilitation  THERAPY DIAG:  Muscle weakness (generalized)  Other abnormalities of gait and mobility  Other low back pain  ONSET DATE: June/July 2023  SUBJECTIVE:                                                                                                                                                                                           SUBJECTIVE STATEMENT: Pt reports having pain for 5 years intermittently.  Pain began in 2018 when she fell and had a compression Fx.  Pt has received cortisone shots in the past.  Pt reports her pain worsened in June/July without any specific reason.  Pt states she has seen multiple MD's for this issue.  Pt had HHPT in 2021 for her back, mobility, and balance.     Pt saw ortho MD.  PT order indicates lumbar scoliosis ? Spinal stenosis, B LE weakness, unsteady gait, and osteoporosis.  It also indicated Balance and Fall Prevention Program, LE strength, and gait/balance.  MD note indicated she has mutlilevel degenerative changes, greatest at L4-5 and L5-S1 and anterolisthesis with potential R sided L4-5 nerve root impingement.    Pt was referred to neurology.  She saw neurologist and note indicated she has severe osteoporosis with a hx of T12 compression Fx 4-5 years  ago.  Neurology note indicated to plan for Lumbar ESI at L5-S1 towards the R.  She is getting the [facility] tomorrow.    Pt states she has weakness in her LE's.  Pt reports having spasms in L >  R LE.  Pt feels more confident with rollator and stands straighter.  She doesn't use it much though, unable to pick it up.  Pt's last fall was 1 year ago.  Pt is limited with ambulation distance.  She feels unstable with walking and has pain with walking.  Pt states her walking is getting worse.  Pt is limited with lifting and has increased pain with lifting.  Pt c/o's of R sided lumbar pain with picking up objects.  Pt states she can't function when she is hurting a lot.  Pt reports balance deficits and is unable to dance.  Pt is limited in standing having pain with standing.    PERTINENT HISTORY:  -Chronic lumbar pain, lumbar spondylosis, Lumbar anterolisthesis -Severe osteoporosis with hx of thoracic compression fractures including T12.  MD note indicated old healed compression deformity of T12, 80% loss of height anteriorly.  -Vertigo and urinary incontinence   PAIN:  NPRS:  Current:  1/10, Worst:  6-7/10, Best:  0/10 Location:  R sided lumbar paraspinals  Aggravating Factors:  walking, lifting objects, standing  Easing Factors:  lying down and stretching with arms behind head, advil Pt states her pain completely goes away when she lies supine on her bed having feet on the floor and putting her hands behind her head.    PRECAUTIONS: Other: osteoporosis, lumbar anterolisthesis, and hx of thoracic compression fractures  WEIGHT BEARING RESTRICTIONS No  FALLS:  Has patient fallen in last 6 months? No  LIVING ENVIRONMENT: Lives with: lives alone.   Lives in: independent living at [place].  Stairs: none to enter her hallway, but does have stairs to get to [employer].   Has following equipment at home:  rollator  OCCUPATION: Pt is retired  PLOF: Independent; Pt was able to ambulate and stand for  greater duration with less difficulty and less pain.  Pt was square dancing 2 years ago.   PATIENT GOALS walk a couple of miles, improve strength   OBJECTIVE:   DIAGNOSTIC FINDINGS:  Lumbar MRI:   Scoliotic curve convex to R with apex at L2-3, chronic deg anterolisthesis at L5-S1, unchanged. Old healed compression deformity of T12, 80% loss of height anteriorly.  Mild posterior bowing of the posterosuperior margin of the vertebral but without compressive stenosis.  L4-5 disc bulge and R sided predominant facet degeneration.  Narrowing of the lateral recess and foramen on the R that could possibly cause R sided neural compression.  L5-S1 facet arthropathy with 3-4 mm of anterolisthesis and disc bulging.  Foraminal stenosis R that could affect the L5 nerve  Thoracic MRI on 06/22: IMPRESSION: 1. Interval T7 and T8 vertebral compression deformities. 2. Stable T11 vertebral compression deformity.   PATIENT SURVEYS:  FOTO 42 with a goal of 52 at visit #12   COGNITION:  Overall cognitive status: Within functional limits for tasks assessed     POSTURE:  R convex curve.  Leaning toward R side.  Kyphotic posture.      LOWER EXTREMITY STRENGTH:     MMT AND HHD  Right eval Left eval  Hip flexion 14.1 14.5  Hip extension    Hip abduction 14.8 13.2  Hip adduction    Hip internal rotation    Hip external rotation    Knee flexion 4-/5 4-/5  Knee extension 4-/5; HHD:  21.2 4-5/ HHD: 19.6  Ankle dorsiflexion 5/5 4+/5  Ankle plantarflexion WFL, tested in sitting WFL, tested in sitting  Ankle inversion    Ankle eversion     (  Blank rows = not tested)    LUMBAR SPECIAL TESTS:  Supine SLR: R:  positive, L:  negative  FUNCTIONAL TESTS:  5x STS test:  21 seconds Pt able to perform sit to stands without UE though was challenged.  GAIT: Assistive device utilized: none Comments: Pt had no LOB though was a little unsteady at times.  Pt had fwd flexed posture and leans more to the R.       TODAY'S TREATMENT  Reviewed her home stretch. Pt performed hooklying clams with RTB 2x10 reps, seated LAQ with RTB 2x10 reps, and sit to stands x 5 reps with UE support.  Pt received a HEP handout and was educated in correct form and appropriate frequency.  Pt instructed she should not have pain with HEP.     PATIENT EDUCATION:  Education details: HEP, dx, POC, rationale of exercises, relevant anatomy, prognosis, and objective findings.   Person educated: Patient Education method: Explanation, Demonstration, Tactile cues, Verbal cues, and Handouts Education comprehension: verbalized understanding, returned demonstration, verbal cues required, tactile cues required, and needs further education   HOME EXERCISE PROGRAM: Access Code: [email or web address] URL: [email or web address] Date: 08/12/2022 Prepared by: Ronny Flurry  Exercises - Sit to Stand with Counter Support  - 1 x daily - 5 x weekly - 2 sets - 5 reps - Hooklying Clamshell with Resistance  - 1 x daily - 4 x weekly - 2 sets - 10 reps - Seated Knee Extension with Resistance  - 1 x daily - 3-4 x weekly - 2 sets - 10 reps  ASSESSMENT:  CLINICAL IMPRESSION: Patient is a 81 y.o. female with diagnoses of lumbar scoliosis, Spinal stenosis, B LE weakness, unsteady gait, and osteoporosis presenting to the clinic with muscle weakness in bilat LE's, gait abnormality, and low back pain.  Pt is limited with ambulation distance, feels unstable with walking and has pain with walking.  Pt states her walking is getting worse.  Pt is limited with lifting and has increased pain with lifting.  Pt is limited in standing having pain with standing.  Pt c/o's of balance deficits and is unable to dance.  Pt should benefit from skilled PT services to address impairments and to improve overall function.       OBJECTIVE IMPAIRMENTS Abnormal gait, decreased activity tolerance, decreased balance, decreased endurance, decreased mobility, difficulty  walking, decreased strength, postural dysfunction, and pain.   ACTIVITY LIMITATIONS carrying, lifting, standing, and locomotion level  PARTICIPATION LIMITATIONS: cleaning, community activity, and dancing  PERSONAL FACTORS Time since onset of injury/illness/exacerbation and 1-2 comorbidities: multiple prior compression fractures, and vertigo  are also affecting patient's functional outcome.   REHAB POTENTIAL: Good  CLINICAL DECISION MAKING: Evolving/moderate complexity  EVALUATION COMPLEXITY: Moderate   GOALS:  SHORT TERM GOALS: Target date: 09/02/2022  Pt will ambulate with improved stability per visual observation and subjective reports Baseline: Goal status: INITIAL  2.  Pt will be able to perform sit to stands with improved ease and form without UE support for improved fxnl LE strength.  Baseline:  Goal status: INITIAL  3.  Pt will report at least a 25% improvement in stability with her daily mobility.  Baseline:  Goal status: INITIAL    LONG TERM GOALS: Target date: 09/23/2022  Pt will be able to perform tandem stance with occasional light UE support for improved balance and stability.  Baseline:  Goal status: INITIAL  2.  Pt will perform 5x STS test in <16 sec for improved fxnl LE strength and performance  of transfers.  Baseline:  Goal status: INITIAL  3.  Pt will report she is able to ambulate her normal daily distance with good confidence and stability without significant pain. Baseline:  Goal status: INITIAL  4.  Pt will demo improved strength by at least 3-4 lbs in bilat hip flexion and 5 lbs in bilat hip abd and knee extension for improved performance of functional mobility.   Baseline:  Goal status: INITIAL    PLAN: PT FREQUENCY: 1-2x/week  PT DURATION: 6 weeks  PLANNED INTERVENTIONS: Therapeutic exercises, Therapeutic activity, Neuromuscular re-education, Balance training, Gait training, Patient/Family education, Self Care, Stair training,  Vestibular training, Dry Needling, Electrical stimulation, Cryotherapy, Moist heat, Taping, Ultrasound, Manual therapy, and Re-evaluation.  PLAN FOR NEXT SESSION:   Review and perform HEP.  LE strengthening and balance training.  MD note indicated Balance and Fall Prevention Program, LE strength, and gait/balance.  Pt is having a lumbar ESI tomorrow.   Selinda Michaels III PT, DPT 08/13/22 10:18 AM   PHYSICAL THERAPY DISCHARGE SUMMARY  Visits from Start of Care: 1  Current functional level related to goals / functional outcomes: Unable to assess current functional status or goals due to pt not being present at discharge.    Remaining deficits: See above    Education / Equipment: Pt has a HEP.   Patient was evaluated on 08/12/2022 and was given a HEP.  She cancelled her following appointments due to being sick and not being able to make them.  PT has not heard back from pt and she will be considered discharged at this time.   Selinda Michaels III PT, DPT 01/27/23 9:59 AM

## 2022-08-12 ENCOUNTER — Other Ambulatory Visit: Payer: Self-pay

## 2022-08-12 ENCOUNTER — Ambulatory Visit (HOSPITAL_BASED_OUTPATIENT_CLINIC_OR_DEPARTMENT_OTHER): Payer: Medicare Other | Attending: Sports Medicine | Admitting: Physical Therapy

## 2022-08-12 DIAGNOSIS — R2689 Other abnormalities of gait and mobility: Secondary | ICD-10-CM | POA: Insufficient documentation

## 2022-08-12 DIAGNOSIS — M6281 Muscle weakness (generalized): Secondary | ICD-10-CM | POA: Insufficient documentation

## 2022-08-12 DIAGNOSIS — M5459 Other low back pain: Secondary | ICD-10-CM | POA: Diagnosis not present

## 2022-08-13 ENCOUNTER — Ambulatory Visit (HOSPITAL_BASED_OUTPATIENT_CLINIC_OR_DEPARTMENT_OTHER): Payer: No Typology Code available for payment source | Admitting: Physical Therapy

## 2022-08-13 ENCOUNTER — Encounter (HOSPITAL_BASED_OUTPATIENT_CLINIC_OR_DEPARTMENT_OTHER): Payer: Self-pay | Admitting: Physical Therapy

## 2022-08-13 DIAGNOSIS — M5416 Radiculopathy, lumbar region: Secondary | ICD-10-CM | POA: Diagnosis not present

## 2022-08-20 ENCOUNTER — Encounter (HOSPITAL_BASED_OUTPATIENT_CLINIC_OR_DEPARTMENT_OTHER): Payer: No Typology Code available for payment source | Admitting: Physical Therapy

## 2022-08-21 ENCOUNTER — Ambulatory Visit (HOSPITAL_BASED_OUTPATIENT_CLINIC_OR_DEPARTMENT_OTHER): Payer: Medicare Other | Admitting: Physical Therapy

## 2022-08-27 ENCOUNTER — Encounter: Payer: Self-pay | Admitting: Internal Medicine

## 2022-08-27 NOTE — Progress Notes (Unsigned)
Virtual Visit via Video Note  I connected with [name] on 08/27/22 at  9:10 AM EDT by a video enabled telemedicine application and verified that I am speaking with the correct person using two identifiers.   I discussed the limitations of evaluation and management by telemedicine and the availability of in person appointments. The patient expressed understanding and agreed to proceed.  Present for the visit:  Myself, Dr Billey Gosling, [facility].  The patient is currently at home and I am in the office.    No referring provider.    History of Present Illness: She is here for an acute visit for cough  Her symptoms started over one week ago.  She states cough that has gotten worse and is productive of some green sputum.  She initially had a sore throat, but that resolved.  She denies any other significant symptoms.  She was concerned because it feels like the cough is getting worse.  Two covid tests were negative.   Taking advil as needed.     Review of Systems  Constitutional:  Negative for fever.  HENT:  Negative for congestion, ear pain, sinus pain and sore throat (resolved).   Respiratory:  Positive for cough and sputum production (green sputum). Negative for shortness of breath and wheezing.   Gastrointestinal:  Negative for diarrhea and nausea.  Musculoskeletal:  Negative for myalgias.  Neurological:  Negative for dizziness and headaches.      Social History   Socioeconomic History   Marital status: Widowed    Spouse name: Not on file   Number of children: 3   Years of education: Not on file   Highest education level: Not on file  Occupational History   Occupation: travel agent    Employer: RETIRED  Tobacco Use   Smoking status: Never   Smokeless tobacco: Never  Vaping Use   Vaping Use: Never used  Substance and Sexual Activity   Alcohol use: Yes    Comment: 1 glass of wine per day   Drug use: No   Sexual activity: Never  Other Topics Concern   Not on  file  Social History Narrative       2-3 c coffee in am    ocass wine    G3P2vaginal delivery   Pt cell [phone]      Patient walks daily for exercise.    Social Determinants of Health   Financial Resource Strain: Low Risk  (03/11/2022)   Overall Financial Resource Strain (CARDIA)    Difficulty of Paying Living Expenses: Not hard at all  Food Insecurity: No Food Insecurity (03/11/2022)   Hunger Vital Sign    Worried About Running Out of Food in the Last Year: Never true    Ran Out of Food in the Last Year: Never true  Transportation Needs: No Transportation Needs (03/11/2022)   PRAPARE - Hydrologist (Medical): No    Lack of Transportation (Non-Medical): No  Physical Activity: Insufficiently Active (03/11/2022)   Exercise Vital Sign    Days of Exercise per Week: 2 days    Minutes of Exercise per Session: 40 min  Stress: No Stress Concern Present (03/11/2022)   [place]    Feeling of Stress : Not at all  Social Connections: Moderately Isolated (03/11/2022)   Social Connection and Isolation Panel [NHANES]    Frequency of Communication with Friends and Family: Twice a week  Frequency of Social Gatherings with Friends and Family: Twice a week    Attends Religious Services: More than 4 times per year    Active Member of [employer] or Organizations: No    Attends Archivist Meetings: Never    Marital Status: Widowed     Observations/Objective: Appears well in NAD Breathing normally Skin appears warm and dry  Assessment and Plan:  URI: Concern for early acute bronchitis Worsening cough that is productive of discolored sputum Given age and high risk will start antibiotic Augmentin 875-125 mg twice daily x10 days Can take over-the-counter cold/allergy medications Rest, fluids Call if no improvement   Follow Up Instructions:    I discussed the assessment and treatment  plan with the patient. The patient was provided an opportunity to ask questions and all were answered. The patient agreed with the plan and demonstrated an understanding of the instructions.   The patient was advised to call back or seek an in-person evaluation if the symptoms worsen or if the condition fails to improve as anticipated.    Binnie Rail, MD

## 2022-08-28 ENCOUNTER — Telehealth (INDEPENDENT_AMBULATORY_CARE_PROVIDER_SITE_OTHER): Payer: Medicare Other | Admitting: Internal Medicine

## 2022-08-28 ENCOUNTER — Telehealth: Payer: No Typology Code available for payment source | Admitting: Family Medicine

## 2022-08-28 DIAGNOSIS — J069 Acute upper respiratory infection, unspecified: Secondary | ICD-10-CM

## 2022-08-28 DIAGNOSIS — I25118 Atherosclerotic heart disease of native coronary artery with other forms of angina pectoris: Secondary | ICD-10-CM

## 2022-08-28 MED ORDER — AMOXICILLIN-POT CLAVULANATE 875-125 MG PO TABS: 1.0000 | ORAL_TABLET | Freq: Two times a day (BID) | ORAL | 0 refills | Status: DC

## 2022-08-30 ENCOUNTER — Encounter (HOSPITAL_BASED_OUTPATIENT_CLINIC_OR_DEPARTMENT_OTHER): Payer: No Typology Code available for payment source | Admitting: Physical Therapy

## 2022-09-02 DIAGNOSIS — M47816 Spondylosis without myelopathy or radiculopathy, lumbar region: Secondary | ICD-10-CM | POA: Diagnosis not present

## 2022-09-03 DIAGNOSIS — L738 Other specified follicular disorders: Secondary | ICD-10-CM | POA: Diagnosis not present

## 2022-09-03 DIAGNOSIS — L570 Actinic keratosis: Secondary | ICD-10-CM | POA: Diagnosis not present

## 2022-09-03 DIAGNOSIS — C44311 Basal cell carcinoma of skin of nose: Secondary | ICD-10-CM | POA: Diagnosis not present

## 2022-09-03 DIAGNOSIS — L720 Epidermal cyst: Secondary | ICD-10-CM | POA: Diagnosis not present

## 2022-09-03 DIAGNOSIS — D485 Neoplasm of uncertain behavior of skin: Secondary | ICD-10-CM | POA: Diagnosis not present

## 2022-09-06 ENCOUNTER — Ambulatory Visit (HOSPITAL_BASED_OUTPATIENT_CLINIC_OR_DEPARTMENT_OTHER): Payer: Medicare Other | Admitting: Physical Therapy

## 2022-09-11 DIAGNOSIS — Z1231 Encounter for screening mammogram for malignant neoplasm of breast: Secondary | ICD-10-CM | POA: Diagnosis not present

## 2022-09-11 LAB — HM MAMMOGRAPHY

## 2022-09-16 ENCOUNTER — Ambulatory Visit (INDEPENDENT_AMBULATORY_CARE_PROVIDER_SITE_OTHER): Payer: Medicare Other | Admitting: Cardiology

## 2022-09-16 ENCOUNTER — Encounter: Payer: Self-pay | Admitting: Internal Medicine

## 2022-09-16 VITALS — BP 150/82 | HR 57 | Ht 59.0 in | Wt 111.8 lb

## 2022-09-16 DIAGNOSIS — R0989 Other specified symptoms and signs involving the circulatory and respiratory systems: Secondary | ICD-10-CM | POA: Diagnosis not present

## 2022-09-16 DIAGNOSIS — E785 Hyperlipidemia, unspecified: Secondary | ICD-10-CM

## 2022-09-16 DIAGNOSIS — I10 Essential (primary) hypertension: Secondary | ICD-10-CM

## 2022-09-16 DIAGNOSIS — I2510 Atherosclerotic heart disease of native coronary artery without angina pectoris: Secondary | ICD-10-CM | POA: Diagnosis not present

## 2022-09-16 NOTE — Progress Notes (Signed)
Cardiology Office Note:    Date:  09/16/2022   ID:  [name], DOB [birth year], MRN [id number]  PCP:  Cassandria Anger, MD  Cardiologist:  Buford Dresser, MD PhD  Referring MD: Cassandria Anger, MD   CC: follow up  History of Present Illness:    [name] is a 81 y.o. female with a hx of HTN, CAD, HLD, paroxysmal atrial fibrillation (during hospitalization in 2017) who is seen for follow up today. I initially met her 07/19/2019 as a new patient to me/prior Dr. Saunders Revel for the evaluation and management of cardiovascular disease.  She last saw Laurann Montana, NP, on 05/15/2022. She reported episodes of shivering. She also noted that when walking, her right foot would tremble. She was continuing to wear compression stockings to control her mild lower extremity edema. Her blood pressure remained labile, with some episodes of hypotension and hypertension at home.   At her last appointment, overall, she was feeling okay. She endorsed a persistent cough, low stamina, and shortness of breath with mild exertion such as walking up a hill. Also she experienced lightheadedness if she turned her head while walking. For the past year she had been suffering from increased tremors/shivering, worse in the morning.  Her left ankle had edema that would worsen by the end of the day, and she had noticed edema in more proximal areas of her left LE as well. Her compression stockings would help, but are warm and difficult to put on. At this visit, she stated the floor "feels like it is vibrating."  A few days ago she visited Dr. Alain Marion and her blood pressure was 110. She has noticed her blood pressure is typically lower in the mornings, with very low readings associated with feeling generally ill and dizzy. However, her average is 415 systolic.  For her regimen, she takes her morning medication around 10 AM, and her evening medication at 8-9 PM.   Typically eats 2 meals a day, and is  gradually losing weight. She does not have much of an appetite. May have yogurt and blueberries for breakfast.  At baseline she sleeps very restfully.  Today:  She says she has been fine.   Her blood pressure in clinic today is 188/78. She reports that her at home blood pressures are around 830N systolic. Upon recheck, her blood pressure reading is 150/82.   She states that she still feels shaky and "wobbly" sometimes. Additionally, she mentions that her legs and arms have felt weak recently. She has not suffered any falls because of this.  Of note, she has daily ankle edema, primarily in her left ankle. She states that some shoes do not fit because of this.  Recently, she has gotten a Clinical research associate and is really enjoying her exercise program.  She denies any palpitations, chest pain, or shortness of breath. No lightheadedness, headaches, syncope, orthopnea, or PND.  Past Medical History:  Diagnosis Date   Abdominal pain, unspecified site    ADVERSE REACTION TO MEDICATION 07/31/2009   ANEMIA-NOS 07/07/2007   Arthritis    Chest pain, unspecified    Chronic kidney disease    hx of kidney stone   COLONIC POLYPS, ADENOMATOUS, HX OF    Complication of anesthesia    Disturbance of skin sensation    DIVERTICULOSIS, COLON 02/23/2009   Family history of diabetes mellitus    Family history of malignant neoplasm of breast    FATIGUE 05/25/2009   Fever, unspecified    Heart murmur  hx of   HYPERLIPIDEMIA 07/07/2007   HYPERPARATHYROIDISM UNSPECIFIED 10/20/2007   HYPERTENSION 02/04/2008   Irritable bowel syndrome (IBS)    NEPHROLITHIASIS, HX OF 11/15/2008   OSTEOPOROSIS 01/01/2008   Other acquired absence of organ    parathyroidectomy   Pain in limb    PALPITATIONS, OCCASIONAL 07/12/2008   PARATHYROIDECTOMY 01/02/2009   PONV (postoperative nausea and vomiting)    SCOLIOSIS 05/25/2009   Seasonal allergies    Unspecified constipation    URINARY INCONTINENCE 07/07/2007   UTI'S, RECURRENT 11/20/2009    kimbrough     Past Surgical History:  Procedure Laterality Date   BUNIONECTOMY     CATARACT EXTRACTION, BILATERAL     COLONOSCOPY     EXTRACORPOREAL SHOCK WAVE LITHOTRIPSY Left 12/21/2020   Procedure: EXTRACORPOREAL SHOCK WAVE LITHOTRIPSY (ESWL);  Surgeon: Festus Aloe, MD;  Location: [facility];  Service: Urology;  Laterality: Left;   LAPAROSCOPIC APPENDECTOMY  01/19/2016   Procedure: APPENDECTOMY LAPAROSCOPIC with orifice of cecal polyp;  Surgeon: Leighton Ruff, MD;  Location: [facility];  Service: General;;   LEFT HEART CATH AND CORONARY ANGIOGRAPHY N/A 04/07/2017   Procedure: Left Heart Cath and Coronary Angiography;  Surgeon: Nelva Bush, MD;  Location: [facility];  Service: Cardiovascular;  Laterality: N/A;   PARATHYROIDECTOMY     Rt Superior open neck exploration   POLYPECTOMY     RIGHT OOPHORECTOMY     TONSILLECTOMY      Current Medications: Current Outpatient Medications on File Prior to Visit  Medication Sig   amLODipine (NORVASC) 2.5 MG tablet Take 1 tablet (2.5 mg total) by mouth at bedtime.   amoxicillin-clavulanate (AUGMENTIN) 875-125 MG tablet Take 1 tablet by mouth 2 (two) times daily.   b complex vitamins capsule Take 1 capsule by mouth daily.   Calcium-Magnesium-Vitamin D 600-40-500 MG-MG-UNIT TB24 Take 1 tablet by mouth daily at 6 PM.   Cholecalciferol (VITAMIN D) 50 MCG (2000 UT) CAPS Take 1 capsule by mouth daily.   denosumab (PROLIA) 60 MG/ML SOSY injection Inject 60 mg into the skin every 6 (six) months.   ipratropium (ATROVENT) 0.06 % nasal spray Place 2 sprays into the nose 3 (three) times daily.   losartan (COZAAR) 50 MG tablet Take 1 tablets(50 mg) at bedtime and 1/2 tablets(25 mg) in the morning   No current facility-administered medications on file prior to visit.     Allergies:   Anesthetics, amide and Lisinopril   Social History   Tobacco Use   Smoking status: Never   Smokeless tobacco: Never  Vaping Use   Vaping Use:  Never used  Substance Use Topics   Alcohol use: Yes    Comment: 1 glass of wine per day   Drug use: No    Family History: The patient's family history includes Breast cancer in her paternal aunt; Diabetes in an other family member; Heart disease in her mother; Heart disease (age of onset: 49) in her father; Sudden death (age of onset: 55) in her father. There is no history of Colon cancer, Stomach cancer, Esophageal cancer, or Pancreatic cancer.  ROS:   Please see the history of present illness.   (+) Shakiness   (+) Weakness (+) Left ankle edema (+) Cough Additional pertinent ROS otherwise unremarkable.   EKGs/Labs/Other Studies Reviewed:    The following studies were reviewed today:  Echo 04/17/2021:  1. Left ventricular ejection fraction, by estimation, is 60 to 65%. The  left ventricle has normal function. The left ventricle has no  regional  wall motion abnormalities. There is severe asymmetric left ventricular  hypertrophy of the basal-septal  segment. Left ventricular diastolic parameters are consistent with Grade I  diastolic dysfunction (impaired relaxation).   2. Right ventricular systolic function is normal. The right ventricular  size is normal. There is normal pulmonary artery systolic pressure. The  estimated right ventricular systolic pressure is 45.0 mmHg.   3. Left atrial size was mildly dilated.   4. The mitral valve is abnormal. Trivial mitral valve regurgitation.   5. The aortic valve is tricuspid. Aortic valve regurgitation is mild.  Mild to moderate aortic valve sclerosis/calcification is present, without  any evidence of aortic stenosis. Aortic regurgitation PHT measures 517  msec.   6. The inferior vena cava is dilated in size with >50% respiratory  variability, suggesting right atrial pressure of 8 mmHg.   Comparison(s): Changes from prior study are noted. 01/22/2016: LVEF 55-60%,  mild LAE, RVSP 49 mmHg.   LHC (04/07/17): Minimal CAD involving large  epicardial coronary arteries. Mild to moderate stenosis of diagonal branches is evident. Normal LV contraction. Normal LV filling pressure. Small right radial artery.   Pharmacologic myocardial perfusion stress test (03/24/17): High risk study with small in size, mild in severity, basal inferoseptal and mid inferoseptal defects that could reflect diaphragmatic attenuation and/or ischemia. Is also a small in size, moderate in severity, partially reversible apical defect. Moderate in size, moderate in severity, basal anteroseptal, mid anteroseptal, and apical septal defect is reversible and concerning for ischemia. LVEF 67%.   Holter monitor (02/18/17): Predominantly sinus rhythm with rare PACs and PVCs. No significant arrhythmias.  EKG:  EKG is personally reviewed.   09/16/22: *** 08/28/2021: not ordered today 04/09/21: sinus rhythm at 68 bpm  Recent Labs: 04/19/2022: BUN 16; Creatinine, Ser 0.77; Hemoglobin 13.8; Magnesium 2.2; Platelets 172; Potassium 4.4; Sodium 141   Recent Lipid Panel    Component Value Date/Time   CHOL 132 01/25/2015 1112   TRIG 64 01/25/2015 1112   HDL 68 01/25/2015 1112   CHOLHDL 1.9 01/25/2015 1112   VLDL 13 01/25/2015 1112   LDLCALC 51 01/25/2015 1112   LDLDIRECT 156.3 03/28/2011 1038    Physical Exam:    VS:  There were no vitals taken for this visit.    Wt Readings from Last 3 Encounters:  07/18/22 111 lb 3.2 oz (50.4 kg)  06/11/22 109 lb (49.4 kg)  05/15/22 111 lb 11.2 oz (50.7 kg)    GEN: Well nourished, well developed in no acute distress HEENT: Normal, moist mucous membranes NECK: No JVD CARDIAC: regular rhythm, normal S1 and S2, no rubs or gallops. No murmur. VASCULAR: Radial and DP pulses 2+ bilaterally. No carotid bruits RESPIRATORY:  Clear to auscultation without rales, wheezing or rhonchi  ABDOMEN: Soft, non-tender, non-distended MUSCULOSKELETAL:  Ambulates independently SKIN: Warm and dry, ***trivial nonpitting ankle edema NEUROLOGIC:  Alert  and oriented x 3. No focal neuro deficits noted. PSYCHIATRIC:  Normal affect    ASSESSMENT:    No diagnosis found.  PLAN:    nonobstructive CAD  -on cath 2018 -continue aspirin, atorvastatin -counseled on red flag warning signs that need immediate medical attention  Hypertension: labile -on losartan 25 mg BID -will trial amlodipine at different times of day to find the best fit to control bp and minimize low BP -limited options for additional meds. No beta blocker due to history of bradycardia. With intermittent low BP, would like to avoid thiazide/loop/MRA to avoid dehydration/orthostasis. Also has history of kidney stones.  Cardiac risk counseling and prevention recommendations: -recommend heart healthy/Mediterranean diet, with whole grains, fruits, vegetable, fish, lean meats, nuts, and olive oil. Limit salt. -recommend moderate walking, 3-5 times/week for 30-50 minutes each session. Aim for at least 150 minutes.week. Goal should be pace of 3 miles/hours, or walking 1.5 miles in 30 minutes -recommend avoidance of tobacco products. Avoid excess alcohol. -on aspirin 81 mg daily per personal preference  Plan for follow up: 1 year.  Medication Adjustments/Labs and Tests Ordered: Current medicines are reviewed at length with the patient today.  Concerns regarding medicines are outlined above.  No orders of the defined types were placed in this encounter.   No orders of the defined types were placed in this encounter.   There are no Patient Instructions on file for this visit.    I,Breanna Adamick,acting as a Education administrator for [employer], MD.,have documented all relevant documentation on the behalf of Buford Dresser, MD,as directed by  Buford Dresser, MD while in the presence of Buford Dresser, MD.   I, Bubba Hales Adamick, have reviewed all documentation for this visit. The documentation on 09/16/22 for the exam, diagnosis, procedures, and orders are all  accurate and complete.   Signed, Buford Dresser, MD PhD 09/16/2022    [facility]

## 2022-09-16 NOTE — Patient Instructions (Signed)
Medication Instructions:  None  *If you need a refill on your cardiac medications before your next appointment, please call your pharmacy*   Lab Work: None   Testing/Procedures: None   Follow-Up: At [facility], you and your health needs are our priority.  As part of our continuing mission to provide you with exceptional heart care, we have created designated Provider Care Teams.  These Care Teams include your primary Cardiologist (physician) and Advanced Practice Providers (APPs -  Physician Assistants and Nurse Practitioners) who all work together to provide you with the care you need, when you need it.  We recommend signing up for the patient portal called "MyChart".  Sign up information is provided on this After Visit Summary.  MyChart is used to connect with patients for Virtual Visits (Telemedicine).  Patients are able to view lab/test results, encounter notes, upcoming appointments, etc.  Non-urgent messages can be sent to your provider as well.   To learn more about what you can do with MyChart, go to [email or web address].    Your next appointment:   1 year(s)  The format for your next appointment:   In Person  Provider:   Buford Dresser, MD    Other Instructions  None

## 2022-09-18 ENCOUNTER — Encounter (HOSPITAL_BASED_OUTPATIENT_CLINIC_OR_DEPARTMENT_OTHER): Payer: Self-pay | Admitting: Cardiology

## 2022-09-19 DIAGNOSIS — C44311 Basal cell carcinoma of skin of nose: Secondary | ICD-10-CM | POA: Diagnosis not present

## 2022-09-24 ENCOUNTER — Encounter: Payer: Self-pay | Admitting: Physician Assistant

## 2022-09-24 ENCOUNTER — Ambulatory Visit (INDEPENDENT_AMBULATORY_CARE_PROVIDER_SITE_OTHER): Payer: Medicare Other | Admitting: Physician Assistant

## 2022-09-24 VITALS — BP 150/68 | HR 68 | Ht <= 58 in | Wt 112.0 lb

## 2022-09-24 DIAGNOSIS — K5909 Other constipation: Secondary | ICD-10-CM | POA: Diagnosis not present

## 2022-09-24 DIAGNOSIS — I2510 Atherosclerotic heart disease of native coronary artery without angina pectoris: Secondary | ICD-10-CM

## 2022-09-24 DIAGNOSIS — R1011 Right upper quadrant pain: Secondary | ICD-10-CM | POA: Diagnosis not present

## 2022-09-24 MED ORDER — LUBIPROSTONE 8 MCG PO CAPS: 8.0000 ug | ORAL_CAPSULE | Freq: Two times a day (BID) | ORAL | 1 refills | Status: DC

## 2022-09-24 NOTE — Progress Notes (Signed)
Chief Complaint: Follow-up constipation  HPI:    [name] is an 81 year old female with a past medical history as listed below, known to Dr. Silverio Decamp, who returns to clinic today for follow-up of her constipation.    05/29/2018 colonoscopy was severe sigmoid diverticulosis and internal hemorrhoids and otherwise normal.  Negative for microscopic colitis.    08/10/2018 patient seen by Dr. Silverio Decamp and had started Zenpep and Colestid and was doing some better.    08/21/2021 patient seen in clinic for constipation.  At that time ordered 2 view abdominal x-ray and recommended patient start Linzess 72 mcg daily.    08/21/2021 abdominal x-ray with left nephrolithiasis and a large amount of stool throughout the colon.  She was told to do a bowel purge with MiraLAX and then start the Linzess 72 mcg daily.    01/04/2022 patient seen in clinic by me and at that time described that she had never done anything that we talked about at the previous visit as she was worried about urgency and lack of control of her bowel movements she never did a bowel purge and never started the [place].  She had been using MiraLAX which was not really helping.  She had not had a bowel movement in 8 days.  At that time ordered a stat abdominal x-ray for concern over obstruction.  Discussed that pending results we will go ahead and do a MiraLAX bowel purge and start Linzess as we discussed at previous visit.    01/10/2022 patient called and described that she had taken the 8 samples of Linzess and had no bowel movement.  She had some abdominal swelling and discomfort.  Her Linzess was increased 245 mcg daily and she was told to do another half of a MiraLAX purge.    01/11/2022 patient called back and described that she had an uncontrollable episode of diarrhea immediately after dinner.  She was told to stay on the Linzess 72 mcg.    01/21/2022 patient described an uncontrollable blowout 11 days prior on 2/23 she had continued Linzess 72 mcg  daily since then with no movement other than a few pebbles.  She describes some stomach pain and gurgling and nausea with cold sweats and shaking.  She felt "lousy".  Dr. Silverio Decamp was asked for her opinion and did not give any further recommendations.  Another x-ray was ordered to see if she was still constipated.    01/28/2022 patient had abdominal x-ray which showed nonobstructive nonspecific bowel gas pattern.    01/30/2022 office visit with me and described that the Linzess 72 mcg had started to work.  He still felt quite ill with nausea and some epigastric pain.  At that time reviewed recent x-ray of her abdomen which did not show constipation.  Recommend she start over-the-counter Omeprazole 20 mg daily for epigastric pain and nausea.    Today, patient tells me she is in her usual state of constipation.  Currently she is using MiraLAX every 3 days or so which will work within the next day or 2 and produce small pebbles followed by a gush of liquid.  Occasionally has liquid stool in between and is incontinent.  Due to this has not increased her MiraLAX though she has tried taking on daily basis before and it really did not work well for her.  She tried the Linzess 72 mcg daily and used it for 2 months but it did not help anything either.  She wonders if there is anything else  to try.  Associated symptoms include bloating and increased borborygmi as well as some generalized abdominal discomfort as it grows and distention.  Interestingly tells me that her nose runs before she needs to have a bowel movement.  Associated symptoms include nausea.    Also discusses some right upper quadrant pain today.  Apparently this was believed to be from her back and she received a cortisone injection and also an epidural but it did not make this pain go away.  It is constant.    Denies fever, chills, weight loss, blood in her stool or symptoms that awaken her from sleep.  Past Medical History:  Diagnosis Date   Abdominal  pain, unspecified site    ADVERSE REACTION TO MEDICATION 07/31/2009   ANEMIA-NOS 07/07/2007   Arthritis    Chest pain, unspecified    Chronic kidney disease    hx of kidney stone   COLONIC POLYPS, ADENOMATOUS, HX OF    Complication of anesthesia    Disturbance of skin sensation    DIVERTICULOSIS, COLON 02/23/2009   Family history of diabetes mellitus    Family history of malignant neoplasm of breast    FATIGUE 05/25/2009   Fever, unspecified    Heart murmur    hx of   HYPERLIPIDEMIA 07/07/2007   HYPERPARATHYROIDISM UNSPECIFIED 10/20/2007   HYPERTENSION 02/04/2008   Irritable bowel syndrome (IBS)    NEPHROLITHIASIS, HX OF 11/15/2008   OSTEOPOROSIS 01/01/2008   Other acquired absence of organ    parathyroidectomy   Pain in limb    PALPITATIONS, OCCASIONAL 07/12/2008   PARATHYROIDECTOMY 01/02/2009   PONV (postoperative nausea and vomiting)    SCOLIOSIS 05/25/2009   Seasonal allergies    Unspecified constipation    URINARY INCONTINENCE 07/07/2007   UTI'S, RECURRENT 11/20/2009   kimbrough     Past Surgical History:  Procedure Laterality Date   BUNIONECTOMY     CATARACT EXTRACTION, BILATERAL     COLONOSCOPY     EXTRACORPOREAL SHOCK WAVE LITHOTRIPSY Left 12/21/2020   Procedure: EXTRACORPOREAL SHOCK WAVE LITHOTRIPSY (ESWL);  Surgeon: Festus Aloe, MD;  Location: [facility];  Service: Urology;  Laterality: Left;   LAPAROSCOPIC APPENDECTOMY  01/19/2016   Procedure: APPENDECTOMY LAPAROSCOPIC with orifice of cecal polyp;  Surgeon: Leighton Ruff, MD;  Location: [facility];  Service: General;;   LEFT HEART CATH AND CORONARY ANGIOGRAPHY N/A 04/07/2017   Procedure: Left Heart Cath and Coronary Angiography;  Surgeon: Nelva Bush, MD;  Location: [facility];  Service: Cardiovascular;  Laterality: N/A;   PARATHYROIDECTOMY     Rt Superior open neck exploration   POLYPECTOMY     RIGHT OOPHORECTOMY     TONSILLECTOMY      Current Outpatient Medications  Medication Sig Dispense  Refill   amLODipine (NORVASC) 2.5 MG tablet Take 1 tablet (2.5 mg total) by mouth at bedtime. 90 tablet 3   b complex vitamins capsule Take 1 capsule by mouth daily.     Calcium-Magnesium-Vitamin D 600-40-500 MG-MG-UNIT TB24 Take 1 tablet by mouth daily at 6 PM.     Cholecalciferol (VITAMIN D) 50 MCG (2000 UT) CAPS Take 1 capsule by mouth daily.     denosumab (PROLIA) 60 MG/ML SOSY injection Inject 60 mg into the skin every 6 (six) months.     losartan (COZAAR) 50 MG tablet Take 1 tablets(50 mg) at bedtime and 1/2 tablets(25 mg) in the morning     No current facility-administered medications for this visit.    Allergies as of 09/24/2022 -  Review Complete 09/18/2022  Allergen Reaction Noted   Anesthetics, amide Other (See Comments) 02/20/2016   Lisinopril Cough 10/05/2009    Family History  Problem Relation Age of Onset   Heart disease Mother        valve replacement   Sudden death Father 89   Heart disease Father 36       MI   Breast cancer Paternal Aunt    Diabetes Other        1st degree relative   Colon cancer Neg Hx    Stomach cancer Neg Hx    Esophageal cancer Neg Hx    Pancreatic cancer Neg Hx     Social History   Socioeconomic History   Marital status: Widowed    Spouse name: Not on file   Number of children: 3   Years of education: Not on file   Highest education level: Not on file  Occupational History   Occupation: travel agent    Employer: RETIRED  Tobacco Use   Smoking status: Never   Smokeless tobacco: Never  Vaping Use   Vaping Use: Never used  Substance and Sexual Activity   Alcohol use: Yes    Comment: 1 glass of wine per day   Drug use: No   Sexual activity: Never  Other Topics Concern   Not on file  Social History Narrative       2-3 c coffee in am    ocass wine    G3P2vaginal delivery   Pt cell [phone]      Patient walks daily for exercise.    Social Determinants of Health   Financial Resource Strain: Low Risk  (03/11/2022)    Overall Financial Resource Strain (CARDIA)    Difficulty of Paying Living Expenses: Not hard at all  Food Insecurity: No Food Insecurity (03/11/2022)   Hunger Vital Sign    Worried About Running Out of Food in the Last Year: Never true    Ran Out of Food in the Last Year: Never true  Transportation Needs: No Transportation Needs (03/11/2022)   PRAPARE - Hydrologist (Medical): No    Lack of Transportation (Non-Medical): No  Physical Activity: Insufficiently Active (03/11/2022)   Exercise Vital Sign    Days of Exercise per Week: 2 days    Minutes of Exercise per Session: 40 min  Stress: No Stress Concern Present (03/11/2022)   [place]    Feeling of Stress : Not at all  Social Connections: Moderately Isolated (03/11/2022)   Social Connection and Isolation Panel [NHANES]    Frequency of Communication with Friends and Family: Twice a week    Frequency of Social Gatherings with Friends and Family: Twice a week    Attends Religious Services: More than 4 times per year    Active Member of [employer] or Organizations: No    Attends Archivist Meetings: Never    Marital Status: Widowed  Intimate Partner Violence: Not At Risk (03/11/2022)   Humiliation, Afraid, Rape, and Kick questionnaire    Fear of Current or Ex-Partner: No    Emotionally Abused: No    Physically Abused: No    Sexually Abused: No    Review of Systems:    Constitutional: No weight loss, fever or chills Cardiovascular: No chest pain Respiratory: No SOB  Gastrointestinal: See HPI and otherwise negative   Physical Exam:  Vital signs: BP (!) 150/68 (BP Location: Left Arm,  Patient Position: Sitting, Cuff Size: Normal)   Pulse 68   Ht '4\' 10"'$  (1.473 m) Comment: height measured without shoes  Wt 112 lb (50.8 kg)   BMI 23.41 kg/m    Constitutional:   Pleasant elderly Caucasian female appears to be in NAD, Well developed, Well  nourished, alert and cooperative Respiratory: Respirations even and unlabored. Lungs clear to auscultation bilaterally.   No wheezes, crackles, or rhonchi.  Cardiovascular: Normal S1, S2. No MRG. Regular rate and rhythm. No peripheral edema, cyanosis or pallor.  Gastrointestinal:  Soft, moderate distension, nontender. No rebound or guarding.  Increased slightly hypertympanic bowel sounds all 4 quadrants. No appreciable masses or hepatomegaly. Rectal:  Not performed.  Psychiatric: Oriented to person, place and time. Demonstrates good judgement and reason without abnormal affect or behaviors.  No recent labs or imaging.  Assessment: 1.  Chronic constipation: No better per the patient even after trial of Linzess, still suffering from overflow at times 2.  Right upper quadrant pain: Consider relation to constipation versus back pain versus gallbladder  Plan: 1.  We discussed abdominal x-ray today but patient declined.  She tells me nothing is any different than it always is. 2.  Trial Of Amitiza 8 mcg with food twice daily #60 with 1 refill.  (If this too expensive for patient can try generic) 3.  Stop MiraLAX while trialing Amitiza above. 4.  Ordered right upper quadrant ultrasound for further evaluation of right upper quadrant pain. 5.  Patient to follow in clinic in 6 to 8 weeks or sooner if necessary. May need to get her back in with Dr. Silverio Decamp shortly.  Ellouise Newer, PA-C [facility] 09/24/2022, 2:30 PM  Cc: Cassandria Anger, MD

## 2022-09-24 NOTE — Patient Instructions (Signed)
We have sent the following medications to your pharmacy for you to pick up at your convenience: Amitiza 8 mcg twice daily  Stop Miralax.   You have been scheduled for an abdominal ultrasound at [facility] (1st floor of hospital) on 10/01/22 at 11 am. Please arrive 30 minutes prior to your appointment for registration. Make certain not to have anything to eat or drink after midnight  prior to your appointment. Should you need to reschedule your appointment, please contact radiology at [phone]. This test typically takes about 30 minutes to perform.   Due to recent changes in healthcare laws, you may see the results of your imaging and laboratory studies on MyChart before your provider has had a chance to review them.  We understand that in some cases there may be results that are confusing or concerning to you. Not all laboratory results come back in the same time frame and the provider may be waiting for multiple results in order to interpret others.  Please give [place] 48 hours in order for your provider to thoroughly review all the results before contacting the office for clarification of your results.    It was a pleasure to see you today!  Thank you for trusting me with your gastrointestinal care!

## 2022-09-25 ENCOUNTER — Other Ambulatory Visit (HOSPITAL_COMMUNITY): Payer: Self-pay

## 2022-09-25 ENCOUNTER — Telehealth: Payer: Self-pay

## 2022-09-25 NOTE — Telephone Encounter (Signed)
Patient Advocate Encounter   Received notification from [employer] that prior authorization for Amitiza 94mg is required.   PA submitted on 09/25/2022 Key [id number] Status is pending       AJoneen Boers CHartvillePatient Advocate Specialist CTiger PointPatient Advocate Team Direct Number: [phone]: [phone]

## 2022-09-25 NOTE — Telephone Encounter (Signed)
Pharmacy Patient Advocate Encounter  Prior Authorization for Amitiza 59mg has been approved.    PA# [id number] dates: 10/0/2023 through 09/25/2023

## 2022-10-01 ENCOUNTER — Ambulatory Visit (HOSPITAL_COMMUNITY): Payer: No Typology Code available for payment source

## 2022-10-02 ENCOUNTER — Encounter: Payer: Self-pay | Admitting: Internal Medicine

## 2022-10-03 ENCOUNTER — Ambulatory Visit (HOSPITAL_COMMUNITY)
Admission: RE | Admit: 2022-10-03 | Discharge: 2022-10-03 | Disposition: A | Discharge status: Home / Self Care | Payer: Medicare Other | Source: Ambulatory Visit | Attending: Physician Assistant | Admitting: Physician Assistant

## 2022-10-03 DIAGNOSIS — R1011 Right upper quadrant pain: Secondary | ICD-10-CM | POA: Diagnosis not present

## 2022-10-03 DIAGNOSIS — K5909 Other constipation: Secondary | ICD-10-CM | POA: Insufficient documentation

## 2022-10-07 DIAGNOSIS — M47816 Spondylosis without myelopathy or radiculopathy, lumbar region: Secondary | ICD-10-CM | POA: Diagnosis not present

## 2022-10-18 DIAGNOSIS — M810 Age-related osteoporosis without current pathological fracture: Secondary | ICD-10-CM | POA: Diagnosis not present

## 2022-10-31 DIAGNOSIS — M47816 Spondylosis without myelopathy or radiculopathy, lumbar region: Secondary | ICD-10-CM | POA: Diagnosis not present

## 2022-10-31 DIAGNOSIS — H10411 Chronic giant papillary conjunctivitis, right eye: Secondary | ICD-10-CM | POA: Diagnosis not present

## 2022-11-20 DIAGNOSIS — C44319 Basal cell carcinoma of skin of other parts of face: Secondary | ICD-10-CM | POA: Diagnosis not present

## 2022-11-20 DIAGNOSIS — Z85828 Personal history of other malignant neoplasm of skin: Secondary | ICD-10-CM | POA: Diagnosis not present

## 2022-11-20 DIAGNOSIS — D485 Neoplasm of uncertain behavior of skin: Secondary | ICD-10-CM | POA: Diagnosis not present

## 2022-11-20 DIAGNOSIS — Z5189 Encounter for other specified aftercare: Secondary | ICD-10-CM | POA: Diagnosis not present

## 2022-11-22 ENCOUNTER — Ambulatory Visit (INDEPENDENT_AMBULATORY_CARE_PROVIDER_SITE_OTHER): Payer: Medicare Other | Admitting: Internal Medicine

## 2022-11-22 ENCOUNTER — Encounter: Payer: Self-pay | Admitting: Internal Medicine

## 2022-11-22 VITALS — BP 162/80 | HR 72 | Temp 97.7°F | Ht <= 58 in | Wt 112.2 lb

## 2022-11-22 DIAGNOSIS — G8929 Other chronic pain: Secondary | ICD-10-CM | POA: Diagnosis not present

## 2022-11-22 DIAGNOSIS — I10 Essential (primary) hypertension: Secondary | ICD-10-CM

## 2022-11-22 DIAGNOSIS — M25572 Pain in left ankle and joints of left foot: Secondary | ICD-10-CM

## 2022-11-22 LAB — COMPREHENSIVE METABOLIC PANEL
ALT: 15 U/L (ref 0–35)
AST: 21 U/L (ref 0–37)
Albumin: 4.1 g/dL (ref 3.5–5.2)
Alkaline Phosphatase: 51 U/L (ref 39–117)
BUN: 17 mg/dL (ref 6–23)
CO2: 26 mEq/L (ref 19–32)
Calcium: 9.3 mg/dL (ref 8.4–10.5)
Chloride: 107 mEq/L (ref 96–112)
Creatinine, Ser: 0.77 mg/dL (ref 0.40–1.20)
GFR: 71.69 mL/min (ref >60.00)
Glucose, Bld: 87 mg/dL (ref 70–99)
Potassium: 3.9 mEq/L (ref 3.5–5.1)
Sodium: 141 mEq/L (ref 135–145)
Total Bilirubin: 1 mg/dL (ref 0.2–1.2)
Total Protein: 6.5 g/dL (ref 6.0–8.3)

## 2022-11-22 LAB — URIC ACID: Uric Acid, Serum: 4.3 mg/dL (ref 2.4–7.0)

## 2022-11-22 MED ORDER — LOSARTAN POTASSIUM 50 MG PO TABS: 50.0000 mg | ORAL_TABLET | Freq: Two times a day (BID) | ORAL | 11 refills | Status: DC

## 2022-11-22 NOTE — Patient Instructions (Signed)
Elastic support sleeve/sock Blue-Emu cream --use 2-3 times a day

## 2022-11-22 NOTE — Progress Notes (Signed)
Subjective:  Patient ID: [name], female    DOB: September 16, 1941  Age: 82 y.o. MRN: [id number]  CC: Foot Swelling (And ankles... Pt states mainly in her left)   HPI [name] presents for L ankle pain, swelling, HTN  Outpatient Medications Prior to Visit  Medication Sig Dispense Refill   amLODipine (NORVASC) 2.5 MG tablet Take 1 tablet (2.5 mg total) by mouth at bedtime. 90 tablet 3   b complex vitamins capsule Take 1 capsule by mouth daily.     Calcium Carb-Cholecalciferol (CALCIUM 600 + D PO) Take 1 tablet by mouth daily.     Cholecalciferol (VITAMIN D) 50 MCG (2000 UT) CAPS Take 1 capsule by mouth daily.     denosumab (PROLIA) 60 MG/ML SOSY injection Inject 60 mg into the skin every 6 (six) months.     lubiprostone (AMITIZA) 8 MCG capsule Take 1 capsule (8 mcg total) by mouth 2 (two) times daily. 60 capsule 1   magnesium oxide (MAG-OX) 400 (240 Mg) MG tablet Take 400 mg by mouth at bedtime.     losartan (COZAAR) 50 MG tablet Take 1 tablets(50 mg) at bedtime and 1/2 tablets(25 mg) in the morning     polyethylene glycol powder (GLYCOLAX/MIRALAX) 17 GM/SCOOP powder Take 17 g by mouth daily. For 4 days (Patient not taking: Reported on 11/22/2022)     No facility-administered medications prior to visit.    ROS: Review of Systems  Objective:  BP (!) 162/80 (BP Location: Left Arm)   Pulse 72   Temp 97.7 F (36.5 C) (Oral)   Ht '4\' 10"'$  (1.473 m)   Wt 112 lb 3.2 oz (50.9 kg)   SpO2 95%   BMI 23.45 kg/m   BP Readings from Last 3 Encounters:  11/22/22 (!) 162/80  09/24/22 (!) 150/68  09/16/22 (!) 150/82    Wt Readings from Last 3 Encounters:  11/22/22 112 lb 3.2 oz (50.9 kg)  09/24/22 112 lb (50.8 kg)  09/16/22 111 lb 12.8 oz (50.7 kg)    Physical Exam  Lab Results  Component Value Date   WBC 6.3 04/19/2022   HGB 13.8 04/19/2022   HCT 40.6 04/19/2022   PLT 172 04/19/2022   GLUCOSE 96 04/19/2022   CHOL 132 01/25/2015   TRIG 64 01/25/2015   HDL 68 01/25/2015    LDLDIRECT 156.3 03/28/2011   LDLCALC 51 01/25/2015   ALT 18 12/18/2017   AST 29 12/18/2017   NA 141 04/19/2022   K 4.4 04/19/2022   CL 104 04/19/2022   CREATININE 0.77 04/19/2022   BUN 16 04/19/2022   CO2 24 04/19/2022   TSH 1.42 01/30/2021   INR 1.0 04/01/2017    US Abdomen Limited RUQ (LIVER/GB)  Result Date: 10/03/2022 CLINICAL DATA:  Right upper quadrant pain EXAM: ULTRASOUND ABDOMEN LIMITED RIGHT UPPER QUADRANT COMPARISON:  Abdominal radiograph January 29, 2022 FINDINGS: Gallbladder: No gallstones or wall thickening visualized. No sonographic Murphy sign noted by sonographer. Common bile duct: Diameter: 2 mm Liver: No focal lesion identified. Within normal limits in parenchymal echogenicity. Portal vein is patent on color Doppler imaging with normal direction of blood flow towards the liver. Other: None. IMPRESSION: No cholelithiasis or sonographic evidence for acute cholecystitis. Electronically Signed   By: Lovey Newcomer M.D.   On: 10/03/2022 11:35    Assessment & Plan:   Problem List Items Addressed This Visit       Cardiovascular and Mediastinum   Hypertension, uncontrolled    Increase Losartan to  50 mg bid, amlodipine      Relevant Medications   losartan (COZAAR) 50 MG tablet   Other Relevant Orders   Comprehensive metabolic panel     Other   Ankle pain, left - Primary    MSK Elastic support sleeve/sock Blue-Emu cream --use 2-3 times a day       Relevant Orders   Comprehensive metabolic panel   Uric acid   Rheumatoid factor      Meds ordered this encounter  Medications   DISCONTD: losartan (COZAAR) 50 MG tablet    Sig: Take 1 tablet (50 mg total) by mouth in the morning and at bedtime. Take 1 tablets(50 mg) at bedtime and 1/2 tablets(25 mg) in the morning    Dispense:  60 tablet    Refill:  11   losartan (COZAAR) 50 MG tablet    Sig: Take 1 tablet (50 mg total) by mouth in the morning and at bedtime.    Dispense:  60 tablet    Refill:  11       Follow-up: Return in about 6 weeks (around 01/03/2023) for a follow-up visit.  Walker Kehr, MD

## 2022-11-22 NOTE — Assessment & Plan Note (Signed)
Increase Losartan to 50 mg bid, amlodipine

## 2022-11-22 NOTE — Assessment & Plan Note (Signed)
MSK Elastic support sleeve/sock Blue-Emu cream --use 2-3 times a day

## 2022-11-23 LAB — RHEUMATOID FACTOR: Rheumatoid fact SerPl-aCnc: <14 IU/mL (ref <14)

## 2022-11-29 DIAGNOSIS — R3915 Urgency of urination: Secondary | ICD-10-CM | POA: Diagnosis not present

## 2022-11-29 DIAGNOSIS — N200 Calculus of kidney: Secondary | ICD-10-CM | POA: Diagnosis not present

## 2022-11-29 DIAGNOSIS — R350 Frequency of micturition: Secondary | ICD-10-CM | POA: Diagnosis not present

## 2022-11-29 DIAGNOSIS — N3020 Other chronic cystitis without hematuria: Secondary | ICD-10-CM | POA: Diagnosis not present

## 2022-12-09 DIAGNOSIS — C44319 Basal cell carcinoma of skin of other parts of face: Secondary | ICD-10-CM | POA: Diagnosis not present

## 2023-01-08 ENCOUNTER — Ambulatory Visit (INDEPENDENT_AMBULATORY_CARE_PROVIDER_SITE_OTHER): Payer: Medicare Other | Admitting: Internal Medicine

## 2023-01-08 ENCOUNTER — Encounter: Payer: Self-pay | Admitting: Internal Medicine

## 2023-01-08 VITALS — BP 150/78 | HR 62 | Temp 98.0°F | Ht <= 58 in | Wt 113.0 lb

## 2023-01-08 DIAGNOSIS — R609 Edema, unspecified: Secondary | ICD-10-CM | POA: Insufficient documentation

## 2023-01-08 DIAGNOSIS — I10 Essential (primary) hypertension: Secondary | ICD-10-CM

## 2023-01-08 DIAGNOSIS — M25572 Pain in left ankle and joints of left foot: Secondary | ICD-10-CM

## 2023-01-08 DIAGNOSIS — M545 Low back pain, unspecified: Secondary | ICD-10-CM

## 2023-01-08 DIAGNOSIS — G8929 Other chronic pain: Secondary | ICD-10-CM | POA: Diagnosis not present

## 2023-01-08 MED ORDER — METHYLPREDNISOLONE ACETATE 40 MG/ML IJ SUSP
40.0000 mg | Freq: Once | INTRAMUSCULAR | Status: AC
  Administered 2023-01-08: 40 mg via INTRAMUSCULAR

## 2023-01-08 NOTE — Assessment & Plan Note (Signed)
Better Use compression socks

## 2023-01-08 NOTE — Patient Instructions (Addendum)
Massage mat for bed Acupuncture mat

## 2023-01-08 NOTE — Assessment & Plan Note (Signed)
Worse Massage mat for bed Acupuncture mat See procedure note - trigger point injection  Salonpas patch

## 2023-01-08 NOTE — Assessment & Plan Note (Signed)
Continue with losartan and amlodipine at low-dose Check BP at home Call if elevated

## 2023-01-08 NOTE — Addendum Note (Signed)
Addended by: Vertell Novak on: 01/08/2023 01:29 PM   Modules accepted: Orders

## 2023-01-08 NOTE — Progress Notes (Signed)
Subjective:  Patient ID: [name], female    DOB: 1941-06-10  Age: 82 y.o. MRN: [id number]  CC: No chief complaint on file.   HPI EMMALYN COWDIN presents for foot swelling - better, HTN, LBP R>>L   Outpatient Medications Prior to Visit  Medication Sig Dispense Refill   amLODipine (NORVASC) 2.5 MG tablet Take 1 tablet (2.5 mg total) by mouth at bedtime. 90 tablet 3   b complex vitamins capsule Take 1 capsule by mouth daily.     Calcium Carb-Cholecalciferol (CALCIUM 600 + D PO) Take 1 tablet by mouth daily.     Cholecalciferol (VITAMIN D) 50 MCG (2000 UT) CAPS Take 1 capsule by mouth daily.     denosumab (PROLIA) 60 MG/ML SOSY injection Inject 60 mg into the skin every 6 (six) months.     losartan (COZAAR) 50 MG tablet Take 1 tablet (50 mg total) by mouth in the morning and at bedtime. 60 tablet 11   lubiprostone (AMITIZA) 8 MCG capsule Take 1 capsule (8 mcg total) by mouth 2 (two) times daily. 60 capsule 1   magnesium oxide (MAG-OX) 400 (240 Mg) MG tablet Take 400 mg by mouth at bedtime.     mirabegron ER (MYRBETRIQ) 25 MG TB24 tablet Take 25 mg by mouth daily.     No facility-administered medications prior to visit.    ROS: Review of Systems  Constitutional:  Negative for activity change, appetite change, chills, fatigue and unexpected weight change.  HENT:  Negative for congestion, mouth sores and sinus pressure.   Eyes:  Negative for visual disturbance.  Respiratory:  Negative for cough and chest tightness.   Gastrointestinal:  Negative for abdominal pain and nausea.  Genitourinary:  Negative for difficulty urinating, frequency and vaginal pain.  Musculoskeletal:  Positive for back pain. Negative for gait problem.  Skin:  Negative for pallor and rash.  Neurological:  Negative for dizziness, tremors, weakness, numbness and headaches.  Psychiatric/Behavioral:  Negative for confusion and sleep disturbance.     Objective:  BP (!) 150/78 (BP Location: Right Arm, Patient  Position: Sitting, Cuff Size: Normal)   Pulse 62   Temp 98 F (36.7 C) (Oral)   Ht 4' 10"$  (1.473 m)   Wt 113 lb (51.3 kg)   SpO2 96%   BMI 23.62 kg/m   BP Readings from Last 3 Encounters:  01/08/23 (!) 150/78  11/22/22 (!) 162/80  09/24/22 (!) 150/68    Wt Readings from Last 3 Encounters:  01/08/23 113 lb (51.3 kg)  11/22/22 112 lb 3.2 oz (50.9 kg)  09/24/22 112 lb (50.8 kg)    Physical Exam Constitutional:      General: She is not in acute distress.    Appearance: Normal appearance. She is well-developed.  HENT:     Head: Normocephalic.     Right Ear: External ear normal.     Left Ear: External ear normal.     Nose: Nose normal.  Eyes:     General:        Right eye: No discharge.        Left eye: No discharge.     Conjunctiva/sclera: Conjunctivae normal.     Pupils: Pupils are equal, round, and reactive to light.  Neck:     Thyroid: No thyromegaly.     Vascular: No JVD.     Trachea: No tracheal deviation.  Cardiovascular:     Rate and Rhythm: Normal rate and regular rhythm.     Heart sounds:  Normal heart sounds.  Pulmonary:     Effort: No respiratory distress.     Breath sounds: No stridor. No wheezing.  Abdominal:     General: Bowel sounds are normal. There is no distension.     Palpations: Abdomen is soft. There is no mass.     Tenderness: There is no abdominal tenderness. There is no guarding or rebound.  Musculoskeletal:        General: No tenderness.     Cervical back: Normal range of motion and neck supple. No rigidity.  Lymphadenopathy:     Cervical: No cervical adenopathy.  Skin:    Findings: No erythema or rash.  Neurological:     Cranial Nerves: No cranial nerve deficit.     Motor: No weakness or abnormal muscle tone.     Coordination: Coordination abnormal.     Deep Tendon Reflexes: Reflexes normal.  Psychiatric:        Behavior: Behavior normal.        Thought Content: Thought content normal.        Judgment: Judgment normal.   R LS  paraspinal muscles - spastic and painful. Kyphosis   Procedure Note :    Trigger Point Injection: R LS paraspinal muscles - spastic and painful   Indication : Focal tender area identifiable by the location without other identifiable neurologic or musculoskeletal finding or pathology.   Risks including unsuccessful procedure , bleeding, infection, bruising, skin atrophy and others were explained to the patient in detail as well as the benefits. Informed consent was obtained and signed.   Tthe patient was placed in a comfortable position. 4   points of maximum tenderness over R LS paraspinal  muscles were marked and  the skin was prepped with Betadine and alcohol. 1 inch 25-gauge needle was used. The needle was advanced perpendicular to the skin. Each trigger point was injected with 1 mL of 2% lidocaine and 10 mg of Depo-Medrol in a usual fashion.  Band-Aids applied.   Tolerated well. Complications: None. Good pain relief following the procedure.  Lab Results  Component Value Date   WBC 6.3 04/19/2022   HGB 13.8 04/19/2022   HCT 40.6 04/19/2022   PLT 172 04/19/2022   GLUCOSE 87 11/22/2022   CHOL 132 01/25/2015   TRIG 64 01/25/2015   HDL 68 01/25/2015   LDLDIRECT 156.3 03/28/2011   LDLCALC 51 01/25/2015   ALT 15 11/22/2022   AST 21 11/22/2022   NA 141 11/22/2022   K 3.9 11/22/2022   CL 107 11/22/2022   CREATININE 0.77 11/22/2022   BUN 17 11/22/2022   CO2 26 11/22/2022   TSH 1.42 01/30/2021   INR 1.0 04/01/2017    US Abdomen Limited RUQ (LIVER/GB)  Result Date: 10/03/2022 CLINICAL DATA:  Right upper quadrant pain EXAM: ULTRASOUND ABDOMEN LIMITED RIGHT UPPER QUADRANT COMPARISON:  Abdominal radiograph January 29, 2022 FINDINGS: Gallbladder: No gallstones or wall thickening visualized. No sonographic Murphy sign noted by sonographer. Common bile duct: Diameter: 2 mm Liver: No focal lesion identified. Within normal limits in parenchymal echogenicity. Portal vein is patent on color  Doppler imaging with normal direction of blood flow towards the liver. Other: None. IMPRESSION: No cholelithiasis or sonographic evidence for acute cholecystitis. Electronically Signed   By: Lovey Newcomer M.D.   On: 10/03/2022 11:35    Assessment & Plan:   Problem List Items Addressed This Visit       Cardiovascular and Mediastinum   Essential hypertension (Chronic)    Continue  with losartan and amlodipine at low-dose Check BP at home Call if elevated        Other   Edema    Better Use compression socks      Chronic bilateral low back pain without sciatica [M54.50, G89.29] - Primary    Worse Massage mat for bed Acupuncture mat See procedure note - trigger point injection  Salonpas patch      Ankle pain, left    Better          No orders of the defined types were placed in this encounter.     Follow-up: Return in about 3 months (around 04/08/2023) for a follow-up visit.  Walker Kehr, MD

## 2023-01-08 NOTE — Assessment & Plan Note (Signed)
Better  

## 2023-02-26 ENCOUNTER — Encounter: Payer: Self-pay | Admitting: Gastroenterology

## 2023-02-26 ENCOUNTER — Ambulatory Visit (INDEPENDENT_AMBULATORY_CARE_PROVIDER_SITE_OTHER): Payer: Medicare Other | Admitting: Gastroenterology

## 2023-02-26 VITALS — BP 170/90 | HR 60 | Ht <= 58 in | Wt 113.5 lb

## 2023-02-26 DIAGNOSIS — H52203 Unspecified astigmatism, bilateral: Secondary | ICD-10-CM | POA: Diagnosis not present

## 2023-02-26 DIAGNOSIS — K5902 Outlet dysfunction constipation: Secondary | ICD-10-CM

## 2023-02-26 DIAGNOSIS — Z961 Presence of intraocular lens: Secondary | ICD-10-CM | POA: Diagnosis not present

## 2023-02-26 DIAGNOSIS — H524 Presbyopia: Secondary | ICD-10-CM | POA: Diagnosis not present

## 2023-02-26 DIAGNOSIS — K582 Mixed irritable bowel syndrome: Secondary | ICD-10-CM | POA: Diagnosis not present

## 2023-02-26 DIAGNOSIS — H04123 Dry eye syndrome of bilateral lacrimal glands: Secondary | ICD-10-CM | POA: Diagnosis not present

## 2023-02-26 NOTE — Patient Instructions (Signed)
We will refer you to Pelvic Floor Therapy  Take Benefiber 1 tablespoon twice a day  Take Miralax 1/2 capful daily and prune juice   Use Senna every other day as needed for constipation  Use Imodium  1-2 tablets as needed for severe diarrhea   _______________________________________________________  If your blood pressure at your visit was 140/90 or greater, please contact your primary care physician to follow up on this.  _______________________________________________________  If you are age 17 or older, your body mass index should be between 23-30. Your Body mass index is 23.72 kg/m. If this is out of the aforementioned range listed, please consider follow up with your Primary Care Provider.  If you are age 40 or younger, your body mass index should be between 19-25. Your Body mass index is 23.72 kg/m. If this is out of the aformentioned range listed, please consider follow up with your Primary Care Provider.   ________________________________________________________  The [place] [facility] providers would like to encourage you to use [facility] to communicate with providers for non-urgent requests or questions.  Due to long hold times on the telephone, sending your provider a message by [facility] may be a faster and more efficient way to get a response.  Please allow 48 business hours for a response.  Please remember that this is for non-urgent requests.  _______________________________________________________   I appreciate the  opportunity to care for you  Thank You   Marsa Aris , MD

## 2023-02-26 NOTE — Progress Notes (Signed)
[name]    [id number]    [birth year]  Primary Care Physician:Plotnikov, Georgina Quint, MD  Referring Physician: Tresa Garter, MD [address] [place],  [place] [address]  Chief complaint: IBS  HPI: 82 year old female with history of large tubulovillous adenoma in cecum status post endoscopic removal, was extending into appendiceal orifice with incomplete removal status post extended appendectomy and partial septectomy March 2017.  She was last seen in office by PA Hyacinth Meeker on 09-24-22 for further evaluation of persistent constipation and diarrhea and surveillance for colon polyps and colorectal cancer screening. GI pathogen panel negative.  She had decreased pancreatic elastase normal fecal fat qualitative.    I last saw her on 08-10-18. At that time her diarrhea had improved and had a BM once every 3-4 days.   Today, she complains of nausea, bloating , and cold sweats after having a cup of coffee in the morning. She reports having alternating constipation and diarrhea. She typically would have a BM once every 5-7 days then have multiple BMs in one day.  She reports taking senna until she has a BM and reports that when she does have a BM, it tend to be uncontrollable. She states has adequate water intake about 30 oz.  She reports feeling dizzy today.    She states that Amitiza and Linzess were unhelpful in managing her symptoms.    [facility] and states that her food intake in adequate.    GI Hx: US Abdomen Limited RUQ 10-03-22  No cholelithiasis or sonographic evidence for acute cholecystitis.   DG Abd 2 view 01-29-22 1. Nonobstructive, nonspecific bowel gas pattern.   DG Abd 2 view 01-04-22 Nonobstructive bowel gas pattern.   Colonoscopy 06-03-18 - Decreased sphincter tone found on digital rectal exam. - Moderate diverticulosis in the sigmoid colon. There was narrowing of the colon in association with the  diverticular opening. There was evidence of diverticular spasm. - Non-bleeding internal hemorrhoids. - Normal mucosa in the entire examined colon. Biopsied.  Biopsies negative for microscopic colitis.  Colonoscopy 10-23-15 1. 9 mm polyp in the cecum at the appendiceal orifice, injected submucosal saline to lift the mucosa, was unable to completely lift the polyp, snare polypectomy was performed in piecemeal but felt there may be an invasive complement and it was incomplete resection 2. Mild diverticulosis was noted in the sigmoid colon 3. Small internal hemorrhoids   Current Outpatient Medications:    amLODipine (NORVASC) 2.5 MG tablet, Take 1 tablet (2.5 mg total) by mouth at bedtime., Disp: 90 tablet, Rfl: 3   b complex vitamins capsule, Take 1 capsule by mouth daily., Disp: , Rfl:    cetirizine (ZYRTEC) 10 MG chewable tablet, Chew 10 mg by mouth daily., Disp: , Rfl:    Cholecalciferol (VITAMIN D) 50 MCG (2000 UT) CAPS, Take 1 capsule by mouth daily., Disp: , Rfl:    denosumab (PROLIA) 60 MG/ML SOSY injection, Inject 60 mg into the skin every 6 (six) months., Disp: , Rfl:    losartan (COZAAR) 50 MG tablet, Take 1 tablet (50 mg total) by mouth in the morning and at bedtime., Disp: 60 tablet, Rfl: 11   magnesium oxide (MAG-OX) 400 (240 Mg) MG tablet, Take 400 mg by mouth at bedtime., Disp: , Rfl:    mirabegron ER (MYRBETRIQ) 25 MG TB24 tablet, Take 25 mg by mouth daily., Disp: , Rfl:  Allergies as of 02/26/2023 - Review Complete 02/26/2023  Allergen Reaction Noted   Anesthetics, amide Other (See Comments) 02/20/2016   Lisinopril Cough 10/05/2009    Past Medical History:  Diagnosis Date   Abdominal pain, unspecified site    ADVERSE REACTION TO MEDICATION 07/31/2009   ANEMIA-NOS 07/07/2007   Arthritis    Chest pain, unspecified    Chronic kidney disease    hx of kidney stone   COLONIC POLYPS, ADENOMATOUS, HX OF    Complication of anesthesia    Disturbance of skin sensation     DIVERTICULOSIS, COLON 02/23/2009   Family history of diabetes mellitus    Family history of malignant neoplasm of breast    FATIGUE 05/25/2009   Fever, unspecified    Heart murmur    hx of   HYPERLIPIDEMIA 07/07/2007   HYPERPARATHYROIDISM UNSPECIFIED 10/20/2007   HYPERTENSION 02/04/2008   Irritable bowel syndrome (IBS)    NEPHROLITHIASIS, HX OF 11/15/2008   OSTEOPOROSIS 01/01/2008   Other acquired absence of organ    parathyroidectomy   Pain in limb    PALPITATIONS, OCCASIONAL 07/12/2008   PARATHYROIDECTOMY 01/02/2009   PONV (postoperative nausea and vomiting)    SCOLIOSIS 05/25/2009   Seasonal allergies    Unspecified constipation    URINARY INCONTINENCE 07/07/2007   UTI'S, RECURRENT 11/20/2009   kimbrough     Past Surgical History:  Procedure Laterality Date   BUNIONECTOMY     CATARACT EXTRACTION, BILATERAL     COLONOSCOPY     EXTRACORPOREAL SHOCK WAVE LITHOTRIPSY Left 12/21/2020   Procedure: EXTRACORPOREAL SHOCK WAVE LITHOTRIPSY (ESWL);  Surgeon: Jerilee Field, MD;  Location[facility];  Service: Urology;  Laterality: Left;   LAPAROSCOPIC APPENDECTOMY  01/19/2016   Procedure: APPENDECTOMY LAPAROSCOPIC with orifice of cecal polyp;  Surgeon: Romie Levee, MD;  Location: [facility];  Service: General;;   LEFT HEART CATH AND CORONARY ANGIOGRAPHY N/A 04/07/2017   Procedure: Left Heart Cath and Coronary Angiography;  Surgeon: [name], MD;  Location: [facility];  Service: Cardiovascular;  Laterality: N/A;   PARATHYROIDECTOMY     Rt Superior open neck exploration   POLYPECTOMY     RIGHT OOPHORECTOMY     TONSILLECTOMY      Family History  Problem Relation Age of Onset   Heart disease Mother        valve replacement   Sudden death Father 64   Heart disease Father 65       MI   Breast cancer Paternal Aunt    Diabetes Other        1st degree relative   Colon cancer Neg Hx    Stomach cancer Neg Hx    Esophageal cancer Neg Hx    Pancreatic cancer Neg Hx      Social History   Socioeconomic History   Marital status: Widowed    Spouse name: Not on file   Number of children: 3   Years of education: Not on file   Highest education level: Not on file  Occupational History   Occupation: travel agent    Employer: RETIRED  Tobacco Use   Smoking status: Never   Smokeless tobacco: Never  Vaping Use   Vaping Use: Never used  Substance and Sexual Activity   Alcohol use: Yes    Comment: 1 glass of wine per day   Drug use: No   Sexual activity: Never  Other Topics Concern   Not on file  Social History Narrative  2-3 c coffee in am    ocass wine    G3P2vaginal delivery   Pt cell [phone]      Patient walks daily for exercise.    Social Determinants of Health   Financial Resource Strain: Low Risk  (03/11/2022)   Overall Financial Resource Strain (CARDIA)    Difficulty of Paying Living Expenses: Not hard at all  Food Insecurity: No Food Insecurity (03/11/2022)   Hunger Vital Sign    Worried About Running Out of Food in the Last Year: Never true    Ran Out of Food in the Last Year: Never true  Transportation Needs: No Transportation Needs (03/11/2022)   PRAPARE - Administrator, Civil Service (Medical): No    Lack of Transportation (Non-Medical): No  Physical Activity: Insufficiently Active (03/11/2022)   Exercise Vital Sign    Days of Exercise per Week: 2 days    Minutes of Exercise per Session: 40 min  Stress: No Stress Concern Present (03/11/2022)   [employer] of Occupational Health - Occupational Stress Questionnaire    Feeling of Stress : Not at all  Social Connections: Moderately Isolated (03/11/2022)   Social Connection and Isolation Panel [NHANES]    Frequency of Communication with Friends and Family: Twice a week    Frequency of Social Gatherings with Friends and Family: Twice a week    Attends Religious Services: More than 4 times per year    Active Member of [employer] or Organizations: No    Attends  Banker Meetings: Never    Marital Status: Widowed  Intimate Partner Violence: Not At Risk (03/11/2022)   Humiliation, Afraid, Rape, and Kick questionnaire    Fear of Current or Ex-Partner: No    Emotionally Abused: No    Physically Abused: No    Sexually Abused: No      Review of systems: Review of Systems  Gastrointestinal:  Positive for abdominal distention (bloating), constipation, diarrhea and nausea.  Neurological:  Positive for dizziness.    Physical Exam: General: well-appearing   Eyes: sclera anicteric, no redness ENT: oral mucosa moist without lesions, no cervical or supraclavicular lymphadenopathy CV: RRR, no JVD, no peripheral edema Resp: clear to auscultation bilaterally, normal RR and effort noted GI: soft, lower abdominal tenderness, with active bowel sounds. No guarding or palpable organomegaly noted. Skin; warm and dry, no rash or jaundice noted Neuro: awake, alert and oriented x 3. Normal gross motor function and fluent speech   Data Reviewed:  Reviewed labs, radiology imaging, old records and pertinent past GI work up   Assessment and Plan/Recommendations:  82 year old female with history of tubulovillous adenoma extending into the appendiceal orifice status post extended appendectomy with partial cecotomy in 2017.  Colonoscopy July 2019 showed normal mucosa, biopsy negative for microscopic colitis, severe sigmoid diverticulosis and internal hemorrhoids. Chronic irritable bowel syndrome with alternating diarrhea and constipation.  Borderline low fecal elastase. She is currently having predominant constipation with bowel movement every 3 to 4 days. She consumes only 1 or 2 bottles [16 ounces] on most days except when she takes crystallite every 3 to 4 days mixed in 64 ounces of fluid. Advised patient to start drinking at least 4-5 bottles of water in a day and increase dietary fiber.   Avoid excessive consumption of simple sugars or artificial  sweeteners Return in 2 to 3 months or sooner if needed  -start benefiber 2-3x daily -start Miralax  -use imodium PNR, senna PNR  -Advised to increase water  intake at least 60 oz  -referral to PT   25 minutes was spent face-to-face with the patient. Greater than 50% of the time used for counseling as well as treatment plan and follow-up. She had multiple questions which were answered to her satisfaction  I,Safa M Kadhim,acting as a scribe for Marsa ArisKavitha Fredna Stricker, MD.,have documented all relevant documentation on the behalf of Marsa ArisKavitha Marvion Bastidas, MD,as directed by  Marsa ArisKavitha Jakeira Seeman, MD while in the presence of Marsa ArisKavitha Mikinzie Maciejewski, MD.   I, Marsa ArisKavitha Craige Patel, MD, have reviewed all documentation for this visit. The documentation on 02/26/23 for the exam, diagnosis, procedures, and orders are all accurate and complete.   Iona BeardK. Veena Falynn Ailey , MD [phone]    CC: Plotnikov, Georgina QuintAleksei V, MD

## 2023-02-27 ENCOUNTER — Encounter: Payer: Self-pay | Admitting: Gastroenterology

## 2023-03-12 ENCOUNTER — Ambulatory Visit: Payer: Medicare Other | Attending: Gastroenterology | Admitting: Physical Therapy

## 2023-03-12 ENCOUNTER — Other Ambulatory Visit: Payer: Self-pay

## 2023-03-12 DIAGNOSIS — M6281 Muscle weakness (generalized): Secondary | ICD-10-CM | POA: Diagnosis not present

## 2023-03-12 DIAGNOSIS — R293 Abnormal posture: Secondary | ICD-10-CM | POA: Diagnosis not present

## 2023-03-12 DIAGNOSIS — R279 Unspecified lack of coordination: Secondary | ICD-10-CM | POA: Insufficient documentation

## 2023-03-12 NOTE — Therapy (Signed)
OUTPATIENT PHYSICAL THERAPY FEMALE PELVIC EVALUATION   Patient Name: [name] MRN: [id number] DOB:1941-09-11, 82 y.o., female Today's Date: 03/12/2023  END OF SESSION:  PT End of Session - 03/12/23 1401     Visit Number 1    Date for PT Re-Evaluation 06/11/23    Authorization Type Medicare    Progress Note Due on Visit 10    PT Start Time 1400    PT Stop Time 1439    PT Time Calculation (min) 39 min    Activity Tolerance Patient tolerated treatment well    Behavior During Therapy WFL for tasks assessed/performed             Past Medical History:  Diagnosis Date   Abdominal pain, unspecified site    ADVERSE REACTION TO MEDICATION 07/31/2009   ANEMIA-NOS 07/07/2007   Arthritis    Chest pain, unspecified    Chronic kidney disease    hx of kidney stone   COLONIC POLYPS, ADENOMATOUS, HX OF    Complication of anesthesia    Disturbance of skin sensation    DIVERTICULOSIS, COLON 02/23/2009   Family history of diabetes mellitus    Family history of malignant neoplasm of breast    FATIGUE 05/25/2009   Fever, unspecified    Heart murmur    hx of   HYPERLIPIDEMIA 07/07/2007   HYPERPARATHYROIDISM UNSPECIFIED 10/20/2007   HYPERTENSION 02/04/2008   Irritable bowel syndrome (IBS)    NEPHROLITHIASIS, HX OF 11/15/2008   OSTEOPOROSIS 01/01/2008   Other acquired absence of organ    parathyroidectomy   Pain in limb    PALPITATIONS, OCCASIONAL 07/12/2008   PARATHYROIDECTOMY 01/02/2009   PONV (postoperative nausea and vomiting)    SCOLIOSIS 05/25/2009   Seasonal allergies    Unspecified constipation    URINARY INCONTINENCE 07/07/2007   UTI'S, RECURRENT 11/20/2009   kimbrough    Past Surgical History:  Procedure Laterality Date   BUNIONECTOMY     CATARACT EXTRACTION, BILATERAL     COLONOSCOPY     EXTRACORPOREAL SHOCK WAVE LITHOTRIPSY Left 12/21/2020   Procedure: EXTRACORPOREAL SHOCK WAVE LITHOTRIPSY (ESWL);  Surgeon: Jerilee Field, MD;  [facility];   Service: Urology;  Laterality: Left;   LAPAROSCOPIC APPENDECTOMY  01/19/2016   Procedure: APPENDECTOMY LAPAROSCOPIC with orifice of cecal polyp;  Surgeon: Romie Levee, MD;  Location: [facility];  Service: General;;   LEFT HEART CATH AND CORONARY ANGIOGRAPHY N/A 04/07/2017   Procedure: Left Heart Cath and Coronary Angiography;  Surgeon: [name], MD;  Location: [facility];  Service: Cardiovascular;  Laterality: N/A;   PARATHYROIDECTOMY     Rt Superior open neck exploration   POLYPECTOMY     RIGHT OOPHORECTOMY     TONSILLECTOMY     Patient Active Problem List   Diagnosis Date Noted   Chronic bilateral low back pain without sciatica [M54.50, G89.29] 01/08/2023   Edema 01/08/2023   Ankle pain, left 11/22/2022   Other specified abnormal immunological findings in serum 04/03/2022   Pathological fracture of vertebra 04/03/2022   URI (upper respiratory infection) 04/03/2022   Allergic rhinitis 04/03/2022   Cough 04/03/2022   Anxiety disorder 06/12/2021   Unsteady gait when walking 01/19/2021   Diplopia 01/19/2021   Late onset Alzheimer's dementia without behavioral disturbance 01/19/2021   Chronic idiopathic constipation 01/19/2021   Labile blood pressure 01/19/2021   Acute parotitis 03/09/2019   Coronary artery disease involving native coronary artery of native heart without angina pectoris 04/28/2017   Syncope 04/07/2017  Shortness of breath 04/07/2017   Abnormal stress test 04/07/2017   Sinus bradycardia 02/21/2017   Coronary artery calcification 02/21/2017   Compression fracture of body of thoracic vertebra 02/08/2017   Dizziness and giddiness 02/08/2017   Atrial fibrillation with rapid ventricular response 01/21/2016   Colon polyp s/p appy/partialcecetomy 01/19/3016 01/19/2016   Orthostatic dizziness 04/25/2014   Vertigo 12/26/2012   Hypertension, uncontrolled 09/11/2012   Diastolic dysfunction 09/11/2012   Sleep disturbance 09/17/2011   Hypotension 12/26/2010    Orthostatic hypotension 12/26/2010   UTI'S, RECURRENT 11/20/2009   ADVERSE REACTION TO MEDICATION 07/31/2009   SCOLIOSIS 05/25/2009   FATIGUE 05/25/2009   DIVERTICULOSIS, COLON 02/23/2009   COLONIC POLYPS, ADENOMATOUS, HX OF 02/23/2009   PARATHYROIDECTOMY 01/02/2009   NEPHROLITHIASIS, HX OF 11/15/2008   PALPITATIONS, OCCASIONAL 07/12/2008   Essential hypertension 02/04/2008   Constipation 01/01/2008   LEG PAIN, LEFT 01/01/2008   Osteoporosis 01/01/2008   Thoracic back pain 12/04/2007   HYPERPARATHYROIDISM UNSPECIFIED 10/20/2007   HYPERLIPIDEMIA 07/07/2007   ANEMIA-NOS 07/07/2007   URINARY INCONTINENCE 07/07/2007    PCP: Tresa Garter, MD  REFERRING PROVIDER: Napoleon Form, MD  REFERRING DIAG: [phone] (ICD-10-CM) - Dyssynergic defecation  THERAPY DIAG:  Muscle weakness (generalized)  Abnormal posture  Unspecified lack of coordination  Rationale for Evaluation and Treatment: Rehabilitation  ONSET DATE: years  SUBJECTIVE:                                                                                                                                                                                           SUBJECTIVE STATEMENT: Pt reports she sometimes goes a week without having a bowel movement and then when she does she goes several times and progressively gets more liquid with each one. Then has accidents when more liquid and doesn't know until she feels it in underwear or goes to the bathroom.   Fluid intake: Yes: water - 30oz usually a day    PAIN:  Are you having pain? No  PRECAUTIONS: None  WEIGHT BEARING RESTRICTIONS: No  FALLS:  Has patient fallen in last 6 months? Yes. Number of falls a few days ago, fell on a step - reports difficulties with Rt leg sometimes  LIVING ENVIRONMENT: Lives with: alone - in I living  Lives in: House/apartment   OCCUPATION: retired   PLOF: Independent  PATIENT GOALS: to have less leakage  PERTINENT HISTORY:   history of large tubulovillous adenoma in cecum status post endoscopic removal, was extending into appendiceal orifice with incomplete removal status post extended appendectomy and partial cecotomy March 2017. Chronic irritable bowel syndrome with alternating constipation and diarrhea. severe sigmoid diverticulosis and  internal hemorrhoids.  Sexual abuse: No  BOWEL MOVEMENT: Pain with bowel movement: No Type of bowel movement:Type (Bristol Stool Scale) 1-7, Frequency sometimes goes a week without having bowel movement then goes for a few days, and Strain No Fully empty rectum: Yes:   Leakage: Yes:   Pads: Yes: small pad for urinary leakage and wears a depend when she is going a lot Fiber supplement: No but does take mirilax and prunes   URINATION: Pain with urination: No Fully empty bladder: Yes:   Stream: Strong and Weak Urgency: Yes:   Frequency: right at 2 hours Leakage: Urge to void, Coughing, Sneezing, Laughing, Exercise, and Bending forward Pads: Yes:    INTERCOURSE: Pain with intercourse:  not active   PREGNANCY: Vaginal deliveries 3 Tearing Yes: thinks so with all three C-section deliveries 0 Currently pregnant No  PROLAPSE: None   OBJECTIVE:   DIAGNOSTIC FINDINGS:     COGNITION: Overall cognitive status: Within functional limits for tasks assessed     SENSATION: Light touch: Appears intact Proprioception: Appears intact  MUSCLE LENGTH: Bil hamstrings and adductors limited by 50%   GAIT: Distance walked: 200' Assistive device utilized: None Level of assistance: Complete Independence Comments: decreased cadence, decreased stride length, decreased Rt step height  POSTURE: rounded shoulders, forward head, increased thoracic kyphosis, posterior pelvic tilt, and right lateral lean  PELVIC ALIGNMENT: Rt pelvic obliquity   LUMBARAROM/PROM:  A/PROM A/PROM  eval  Flexion Decreased by 75%  Extension Decreased by 50%  Right lateral flexion Decreased  by 75%  Left lateral flexion Decreased by 75%  Right rotation Decreased by 75%  Left rotation Decreased by 75%   (Blank rows = not tested)  LOWER EXTREMITY ROM:  WFL  LOWER EXTREMITY MMT:  Rt hip grossly 3+/5 except flexion 3/5; Lt hip grossly 3+/5, knees 4/5 PALPATION:   General  no TTP but does have noted fascial restrictions in all quadrants of abdomen                External Perineal Exam pt deferred                             Internal Pelvic Floor pt deferred  Patient confirms identification and approves PT to assess internal pelvic floor and treatment No  PELVIC MMT:   MMT eval  Vaginal   Internal Anal Sphincter   External Anal Sphincter   Puborectalis   Diastasis Recti   (Blank rows = not tested)        TONE: Pt deferred  PROLAPSE: Pt deferred  TODAY'S TREATMENT:                                                                                                                              DATE:   03/12/2023 EVAL Examination completed, findings reviewed, pt educated on POC, abdominal massage and voiding mechanics. Pt motivated to participate in PT and agreeable  to attempt recommendations.     PATIENT EDUCATION:  Education details: abdominal massage and voiding mechanics Person educated: Patient Education method: Explanation, Demonstration, Tactile cues, Verbal cues, and Handouts Education comprehension: verbalized understanding and returned demonstration  HOME EXERCISE PROGRAM: abdominal massage and voiding mechanics  ASSESSMENT:  CLINICAL IMPRESSION: Patient is a 82 y.o. female  who was seen today for physical therapy evaluation and treatment for constipation and fecal incontinence. Pt found to have gait impairments with abnormal posture with scoliosis and kyphosis. Pt has recent fall but denies injury, had decreased spinal and hip flexibility, decreased hip strength, decreased core strength and fascial restrictions throughout abdomen. Pt reports due to  leakage of urine and bowels she feels like she can't go out or do as much and used to be very active. Pt would benefit from additional PT to further address deficits.    OBJECTIVE IMPAIRMENTS: decreased coordination, decreased endurance, decreased mobility, difficulty walking, decreased strength, increased fascial restrictions, impaired flexibility, improper body mechanics, and postural dysfunction.   ACTIVITY LIMITATIONS: continence  PARTICIPATION LIMITATIONS: shopping and community activity  PERSONAL FACTORS: Time since onset of injury/illness/exacerbation are also affecting patient's functional outcome.   REHAB POTENTIAL: Good  CLINICAL DECISION MAKING: Stable/uncomplicated  EVALUATION COMPLEXITY: Low   GOALS: Goals reviewed with patient? Yes  SHORT TERM GOALS: Target date: 04/09/23  Pt to be I with HEP.  Baseline: Goal status: INITIAL  2.  Pt to be I with voiding and breathing mechanics and abdominal massage for improved bowel habits.  Baseline:  Goal status: INITIAL  3.  Pt to be I with urge drill and knack method to improve urinary leakage.  Baseline:  Goal status: INITIAL   LONG TERM GOALS: Target date: 06/11/23  Pt to be I with advanced HEP.  Baseline:  Goal status: INITIAL  2.  Pt to demonstrate at least 5/5 bil hip strength for improved pelvic stability and functional squats without leakage.  Baseline:  Goal status: INITIAL  3.  Pt to demonstrate improved coordination of pelvic floor and breathing mechanics with squat with appropriate synergistic patterns to decrease pain and leakage at least 75% of the time.    Baseline:  Goal status: INITIAL  4.  Pt will report 3 BMs per week due to improved muscle tone and coordination with BMs.  Baseline:  Goal status: INITIAL  5.  Pt will report her BMs are complete due to improved bowel habits and evacuation techniques.  Baseline:  Goal status: INITIAL  6.  Pt to demonstrate at least 3/5 pelvic floor strength and  holds for at least 8s for improved pelvic stability and decreased strain at pelvic floor/ decrease leakage.  Baseline:  Goal status: INITIAL  PLAN:  PT FREQUENCY: every other week  PT DURATION:  6 sessions  PLANNED INTERVENTIONS: Therapeutic exercises, Therapeutic activity, Neuromuscular re-education, Balance training, Patient/Family education, Self Care, Joint mobilization, Dry Needling, Electrical stimulation, Spinal mobilization, Cryotherapy, Moist heat, scar mobilization, Biofeedback, and Manual therapy  PLAN FOR NEXT SESSION: internal if needed and pt consents, breathing and voiding mechanics, urge drill, knack, coordination of pelvic floor and breathing/core and exercises  Otelia Sergeant, PT, DPT 04/24/243:32 PM

## 2023-03-17 ENCOUNTER — Ambulatory Visit (INDEPENDENT_AMBULATORY_CARE_PROVIDER_SITE_OTHER): Payer: Medicare Other

## 2023-03-17 VITALS — Ht <= 58 in | Wt 114.0 lb

## 2023-03-17 DIAGNOSIS — Z0000 Encounter for general adult medical examination without abnormal findings: Secondary | ICD-10-CM

## 2023-03-17 NOTE — Patient Instructions (Addendum)
[name] , Thank you for taking time to come for your Medicare Wellness Visit. I appreciate your ongoing commitment to your health goals. Please review the following plan we discussed and let me know if I can assist you in the future.   These are the goals we discussed:  Goals       Patient Stated she resident at [facility] community. Her needs are met by staff at [facility]. Declined program services (pt-stated)      Care Coordination Interventions:  Active listening / Reflection utilized  Informed client about Care Coordination program support Client said she resides at [facility]. She said her needs are met through the staff at [facility]. She declined program support at this time         This is a list of the screening recommended for you and due dates:  Health Maintenance  Topic Date Due   Colon Cancer Screening  06/04/2019   COVID-19 Vaccine (7 - 2023-24 season) 07/19/2022   Flu Shot  06/19/2023   Medicare Annual Wellness Visit  03/16/2024   DTaP/Tdap/Td vaccine (2 - Td or Tdap) 01/05/2025   Pneumonia Vaccine  Completed   DEXA scan (bone density measurement)  Completed   Zoster (Shingles) Vaccine  Completed   HPV Vaccine  Aged Out    Advanced directives/DNR: Yes; Documents on file with [facility].  Conditions/risks identified: Yes; Falls  Next appointment: Follow up in one year for your annual wellness visit.   Preventive Care 14 Years and Older, Female Preventive care refers to lifestyle choices and visits with your health care provider that can promote health and wellness. What does preventive care include? A yearly physical exam. This is also called an annual well check. Dental exams once or twice a year. Routine eye exams. Ask your health care provider how often you should have your eyes checked. Personal lifestyle choices, including: Daily care of your teeth and gums. Regular physical activity. Eating a  healthy diet. Avoiding tobacco and drug use. Limiting alcohol use. Practicing safe sex. Taking low-dose aspirin every day. Taking vitamin and mineral supplements as recommended by your health care provider. What happens during an annual well check? The services and screenings done by your health care provider during your annual well check will depend on your age, overall health, lifestyle risk factors, and family history of disease. Counseling  Your health care provider may ask you questions about your: Alcohol use. Tobacco use. Drug use. Emotional well-being. Home and relationship well-being. Sexual activity. Eating habits. History of falls. Memory and ability to understand (cognition). Work and work Astronomer. Reproductive health. Screening  You may have the following tests or measurements: Height, weight, and BMI. Blood pressure. Lipid and cholesterol levels. These may be checked every 5 years, or more frequently if you are over 39 years old. Skin check. Lung cancer screening. You may have this screening every year starting at age 53 if you have a 30-pack-year history of smoking and currently smoke or have quit within the past 15 years. Fecal occult blood test (FOBT) of the stool. You may have this test every year starting at age 90. Flexible sigmoidoscopy or colonoscopy. You may have a sigmoidoscopy every 5 years or a colonoscopy every 10 years starting at age 57. Hepatitis C blood test. Hepatitis B blood test. Sexually transmitted disease (STD) testing. Diabetes screening. This is done by checking your blood sugar (glucose) after you have not eaten for a while (fasting). You may have  this done every 1-3 years. Bone density scan. This is done to screen for osteoporosis. You may have this done starting at age 90. Mammogram. This may be done every 1-2 years. Talk to your health care provider about how often you should have regular mammograms. Talk with your health care  provider about your test results, treatment options, and if necessary, the need for more tests. Vaccines  Your health care provider may recommend certain vaccines, such as: Influenza vaccine. This is recommended every year. Tetanus, diphtheria, and acellular pertussis (Tdap, Td) vaccine. You may need a Td booster every 10 years. Zoster vaccine. You may need this after age 61. Pneumococcal 13-valent conjugate (PCV13) vaccine. One dose is recommended after age 51. Pneumococcal polysaccharide (PPSV23) vaccine. One dose is recommended after age 34. Talk to your health care provider about which screenings and vaccines you need and how often you need them. This information is not intended to replace advice given to you by your health care provider. Make sure you discuss any questions you have with your health care provider. Document Released: 12/01/2015 Document Revised: 07/24/2016 Document Reviewed: 09/05/2015 Elsevier Interactive Patient Education  2017 [employer].  Fall Prevention in the Home Falls can cause injuries. They can happen to people of all ages. There are many things you can do to make your home safe and to help prevent falls. What can I do on the outside of my home? Regularly fix the edges of walkways and driveways and fix any cracks. Remove anything that might make you trip as you walk through a door, such as a raised step or threshold. Trim any bushes or trees on the path to your home. Use bright outdoor lighting. Clear any walking paths of anything that might make someone trip, such as rocks or tools. Regularly check to see if handrails are loose or broken. Make sure that both sides of any steps have handrails. Any raised decks and porches should have guardrails on the edges. Have any leaves, snow, or ice cleared regularly. Use sand or salt on walking paths during winter. Clean up any spills in your garage right away. This includes oil or grease spills. What can I do in the  bathroom? Use night lights. Install grab bars by the toilet and in the tub and shower. Do not use towel bars as grab bars. Use non-skid mats or decals in the tub or shower. If you need to sit down in the shower, use a plastic, non-slip stool. Keep the floor dry. Clean up any water that spills on the floor as soon as it happens. Remove soap buildup in the tub or shower regularly. Attach bath mats securely with double-sided non-slip rug tape. Do not have throw rugs and other things on the floor that can make you trip. What can I do in the bedroom? Use night lights. Make sure that you have a light by your bed that is easy to reach. Do not use any sheets or blankets that are too big for your bed. They should not hang down onto the floor. Have a firm chair that has side arms. You can use this for support while you get dressed. Do not have throw rugs and other things on the floor that can make you trip. What can I do in the kitchen? Clean up any spills right away. Avoid walking on wet floors. Keep items that you use a lot in easy-to-reach places. If you need to reach something above you, use a strong step stool  that has a grab bar. Keep electrical cords out of the way. Do not use floor polish or wax that makes floors slippery. If you must use wax, use non-skid floor wax. Do not have throw rugs and other things on the floor that can make you trip. What can I do with my stairs? Do not leave any items on the stairs. Make sure that there are handrails on both sides of the stairs and use them. Fix handrails that are broken or loose. Make sure that handrails are as long as the stairways. Check any carpeting to make sure that it is firmly attached to the stairs. Fix any carpet that is loose or worn. Avoid having throw rugs at the top or bottom of the stairs. If you do have throw rugs, attach them to the floor with carpet tape. Make sure that you have a light switch at the top of the stairs and the  bottom of the stairs. If you do not have them, ask someone to add them for you. What else can I do to help prevent falls? Wear shoes that: Do not have high heels. Have rubber bottoms. Are comfortable and fit you well. Are closed at the toe. Do not wear sandals. If you use a stepladder: Make sure that it is fully opened. Do not climb a closed stepladder. Make sure that both sides of the stepladder are locked into place. Ask someone to hold it for you, if possible. Clearly mark and make sure that you can see: Any grab bars or handrails. First and last steps. Where the edge of each step is. Use tools that help you move around (mobility aids) if they are needed. These include: Canes. Walkers. Scooters. Crutches. Turn on the lights when you go into a dark area. Replace any light bulbs as soon as they burn out. Set up your furniture so you have a clear path. Avoid moving your furniture around. If any of your floors are uneven, fix them. If there are any pets around you, be aware of where they are. Review your medicines with your doctor. Some medicines can make you feel dizzy. This can increase your chance of falling. Ask your doctor what other things that you can do to help prevent falls. This information is not intended to replace advice given to you by your health care provider. Make sure you discuss any questions you have with your health care provider. Document Released: 08/31/2009 Document Revised: 04/11/2016 Document Reviewed: 12/09/2014 Elsevier Interactive Patient Education  2017 [employer].

## 2023-03-17 NOTE — Progress Notes (Addendum)
I connected with  [name] on 03/17/23 by a audio enabled telemedicine application and verified that I am speaking with the correct person using two identifiers.  Patient Location: Home  Provider Location: Office/Clinic  I discussed the limitations of evaluation and management by telemedicine. The patient expressed understanding and agreed to proceed.  Patient Medicare AWV questionnaire was completed by the patient on 03/12/2023; I have confirmed that all information answered by patient is correct and no changes since this date.    Subjective:   [name] is a 82 y.o. female who presents for Medicare Annual (Subsequent) preventive examination.  Review of Systems     Cardiac Risk Factors include: advanced age (>63men, >18 women);dyslipidemia;family history of premature cardiovascular disease;hypertension;sedentary lifestyle     Objective:    Today's Vitals   03/17/23 1119 03/17/23 1127  Weight: 114 lb (51.7 kg)   Height: 4\' 10"  (1.473 m)   PainSc: 9  10-Worst pain ever  PainLoc: Back    Body mass index is 23.83 kg/m.     03/12/2023    2:15 PM 08/12/2022   11:14 AM 03/11/2022   10:06 AM 04/05/2021    4:33 PM 01/30/2021    9:56 AM 01/16/2021    4:21 PM 01/09/2021    3:56 PM  Advanced Directives  Does Patient Have a Medical Advance Directive? Yes Yes Yes Yes Yes Yes Yes  Type of Special educational needs teacher of [place];Living will Healthcare Power of [place];Living will Healthcare Power of [place];Living will Healthcare Power of [place];Living will Living will Living will  Does patient want to make changes to medical advance directive? No - Patient declined     No - Patient declined No - Patient declined  Copy of Healthcare Power of Attorney in Chart?   No - copy requested        Current Medications (verified) Outpatient Encounter Medications as of 03/17/2023  Medication Sig   amLODipine (NORVASC) 2.5 MG tablet Take 1 tablet (2.5 mg total) by mouth at  bedtime. (Patient not taking: Reported on 03/12/2023)   b complex vitamins capsule Take 1 capsule by mouth daily.   cetirizine (ZYRTEC) 10 MG chewable tablet Chew 10 mg by mouth daily.   Cholecalciferol (VITAMIN D) 50 MCG (2000 UT) CAPS Take 1 capsule by mouth daily.   denosumab (PROLIA) 60 MG/ML SOSY injection Inject 60 mg into the skin every 6 (six) months.   losartan (COZAAR) 50 MG tablet Take 1 tablet (50 mg total) by mouth in the morning and at bedtime. (Patient not taking: Reported on 03/12/2023)   magnesium oxide (MAG-OX) 400 (240 Mg) MG tablet Take 400 mg by mouth at bedtime. (Patient not taking: Reported on 03/12/2023)   mirabegron ER (MYRBETRIQ) 25 MG TB24 tablet Take 25 mg by mouth daily. (Patient not taking: Reported on 03/12/2023)   No facility-administered encounter medications on file as of 03/17/2023.    Allergies (verified) Anesthetics, amide and Lisinopril   History: Past Medical History:  Diagnosis Date   Abdominal pain, unspecified site    ADVERSE REACTION TO MEDICATION 07/31/2009   ANEMIA-NOS 07/07/2007   Arthritis    Chest pain, unspecified    Chronic kidney disease    hx of kidney stone   COLONIC POLYPS, ADENOMATOUS, HX OF    Complication of anesthesia    Disturbance of skin sensation    DIVERTICULOSIS, COLON 02/23/2009   Family history of diabetes mellitus    Family history of malignant neoplasm of breast  FATIGUE 05/25/2009   Fever, unspecified    Heart murmur    hx of   HYPERLIPIDEMIA 07/07/2007   HYPERPARATHYROIDISM UNSPECIFIED 10/20/2007   HYPERTENSION 02/04/2008   Irritable bowel syndrome (IBS)    NEPHROLITHIASIS, HX OF 11/15/2008   OSTEOPOROSIS 01/01/2008   Other acquired absence of organ    parathyroidectomy   Pain in limb    PALPITATIONS, OCCASIONAL 07/12/2008   PARATHYROIDECTOMY 01/02/2009   PONV (postoperative nausea and vomiting)    SCOLIOSIS 05/25/2009   Seasonal allergies    Unspecified constipation    URINARY INCONTINENCE 07/07/2007   UTI'S,  RECURRENT 11/20/2009   kimbrough    Past Surgical History:  Procedure Laterality Date   BUNIONECTOMY     CATARACT EXTRACTION, BILATERAL     COLONOSCOPY     EXTRACORPOREAL SHOCK WAVE LITHOTRIPSY Left 12/21/2020   Procedure: EXTRACORPOREAL SHOCK WAVE LITHOTRIPSY (ESWL);  Surgeon: Jerilee Field, MD;  [facility]: [facility];  Service: Urology;  Laterality: Left;   LAPAROSCOPIC APPENDECTOMY  01/19/2016   Procedure: APPENDECTOMY LAPAROSCOPIC with orifice of cecal polyp;  Surgeon: Romie Levee, MD;  Location: [facility];  Service: General;;   LEFT HEART CATH AND CORONARY ANGIOGRAPHY N/A 04/07/2017   Procedure: Left Heart Cath and Coronary Angiography;  Surgeon: [name], MD;  Location: [facility];  Service: Cardiovascular;  Laterality: N/A;   PARATHYROIDECTOMY     Rt Superior open neck exploration   POLYPECTOMY     RIGHT OOPHORECTOMY     TONSILLECTOMY     Family History  Problem Relation Age of Onset   Heart disease Mother        valve replacement   Sudden death Father 51   Heart disease Father 65       MI   Breast cancer Paternal Aunt    Diabetes Other        1st degree relative   Colon cancer Neg Hx    Stomach cancer Neg Hx    Esophageal cancer Neg Hx    Pancreatic cancer Neg Hx    Social History   Socioeconomic History   Marital status: Widowed    Spouse name: Not on file   Number of children: 3   Years of education: Not on file   Highest education level: Not on file  Occupational History   Occupation: travel agent    Employer: RETIRED  Tobacco Use   Smoking status: Never   Smokeless tobacco: Never  Vaping Use   Vaping Use: Never used  Substance and Sexual Activity   Alcohol use: Yes    Comment: 1 glass of wine per day   Drug use: No   Sexual activity: Never  Other Topics Concern   Not on file  Social History Narrative       2-3 c coffee in am    ocass wine    G3P2vaginal delivery   Pt cell [phone]      Patient walks daily for  exercise.    Social Determinants of Health   Financial Resource Strain: Low Risk  (03/17/2023)   Overall Financial Resource Strain (CARDIA)    Difficulty of Paying Living Expenses: Not very hard  Food Insecurity: No Food Insecurity (03/17/2023)   Hunger Vital Sign    Worried About Running Out of Food in the Last Year: Never true    Ran Out of Food in the Last Year: Never true  Transportation Needs: No Transportation Needs (03/17/2023)   PRAPARE - Transportation  Lack of Transportation (Medical): No    Lack of Transportation (Non-Medical): No  Physical Activity: Insufficiently Active (03/17/2023)   Exercise Vital Sign    Days of Exercise per Week: 1 day    Minutes of Exercise per Session: 20 min  Stress: Stress Concern Present (03/17/2023)   [employer] of Occupational Health - Occupational Stress Questionnaire    Feeling of Stress : To some extent  Social Connections: Moderately Integrated (03/17/2023)   Social Connection and Isolation Panel [NHANES]    Frequency of Communication with Friends and Family: More than three times a week    Frequency of Social Gatherings with Friends and Family: More than three times a week    Attends Religious Services: More than 4 times per year    Active Member of [employer] or Organizations: Yes    Attends Banker Meetings: More than 4 times per year    Marital Status: Widowed    Tobacco Counseling Counseling given: Not Answered   Clinical Intake:  Pre-visit preparation completed: Yes  Pain : 0-10 Pain Score: 10-Worst pain ever Pain Type: Chronic pain Pain Location: Back     BMI - recorded: 23.83 Nutritional Status: BMI of 19-24  Normal Nutritional Risks: None Diabetes: No  How often do you need to have someone help you when you read instructions, pamphlets, or other written materials from your doctor or pharmacy?: 1 - Never 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  No  Interpreter Needed?: No  Information entered by :: Susie Cassette, LPN.   Activities of Daily Living    03/17/2023   11:41 AM 03/12/2023    5:11 PM  In your present state of health, do you have any difficulty performing the following activities:  Hearing? 0 0  Vision? 1 1  Difficulty concentrating or making decisions? 1 1  Walking or climbing stairs? 1 1  Dressing or bathing? 0 0  Doing errands, shopping? 0 0  Preparing Food and eating ? N N  Using the Toilet? N N  In the past six months, have you accidently leaked urine? Y Y  Do you have problems with loss of bowel control? Y Y  Managing your Medications? N N  Managing your Finances? N N  Housekeeping or managing your Housekeeping? N N    Patient Care Team: Plotnikov, Georgina Quint, MD as PCP - General (Internal Medicine) Jodelle Red, MD as PCP - Cardiology (Cardiology) [place], Amy, MD (Dermatology) [place], [place] Lowella Bandy., MD (Urology) Romie Levee, MD as Consulting Physician (General Surgery) Napoleon Form, MD as Consulting Physician (Gastroenterology) Melina Fiddler, MD as Consulting Physician (Sports Medicine)  Indicate any recent Medical Services you may have received from other than [facility] providers in the past year (date may be approximate).     Assessment:   This is a routine wellness examination for [name].  Hearing/Vision screen Hearing Screening - Comments:: Denies hearing difficulties   Vision Screening - Comments:: Wears rx glasses - up to date with routine eye exams with Antony Contras, MD.   Dietary issues and exercise activities discussed: Current Exercise Habits: Home exercise routine, Type of exercise: walking, Time (Minutes): 20, Frequency (Times/Week): 1, Weekly Exercise (Minutes/Week): 20, Intensity: Mild, Exercise limited by: cardiac condition(s)   Goals Addressed   None   Depression Screen    03/17/2023   11:33 AM 01/08/2023   11:33 AM 07/18/2022    2:28 PM 04/03/2022    11:33 AM  03/11/2022   10:06 AM 04/24/2021    1:33 PM 01/05/2015    1:04 PM  PHQ 2/9 Scores  PHQ - 2 Score 0 1 0 2 0 0 0  PHQ- 9 Score 0   4  0     Fall Risk    03/17/2023   11:40 AM 03/12/2023    5:11 PM 01/08/2023   11:33 AM 06/11/2022   11:22 AM 04/03/2022   11:33 AM  Fall Risk   Falls in the past year? 1 1 0 0 0  Number falls in past yr: 0 0 0 0 0  Injury with Fall? 0 0 0 0 0  Risk for fall due to : Impaired balance/gait;History of fall(s);Other (Comment)  No Fall Risks    Risk for fall due to: Comment history of fractures      Follow up Falls prevention discussed;Education provided;Falls evaluation completed  Falls evaluation completed      FALL RISK PREVENTION PERTAINING TO THE HOME:  Any stairs in or around the home? Yes  elevator at facility If so, are there any without handrails? No  Home free of loose throw rugs in walkways, pet beds, electrical cords, etc? Yes  Adequate lighting in your home to reduce risk of falls? Yes   ASSISTIVE DEVICES UTILIZED TO PREVENT FALLS:  Life alert? Yes  Use of a cane, walker or w/c? Yes  Grab bars in the bathroom? Yes  Shower chair or bench in shower? Yes  Elevated toilet seat or a handicapped toilet? Yes   TIMED UP AND GO:  Was the test performed? No . Telephonic Visit  Cognitive Function:        03/17/2023   11:43 AM  6CIT Screen  What Year? 0 points  What month? 0 points  What time? 0 points  Count back from 20 0 points  Months in reverse 0 points  Repeat phrase 0 points  Total Score 0 points    Immunizations Immunization History  Administered Date(s) Administered   Influenza Split 09/17/2011, 11/08/2015, 09/12/2017, 08/02/2020   Influenza Whole 08/19/2008   Influenza, High Dose Seasonal PF 08/20/2016, 09/12/2017, 08/21/2018, 09/28/2019, 10/12/2021, 09/01/2022   Influenza-Unspecified 09/18/2013, 08/18/2014, 11/08/2015, 09/15/2020   Moderna SARS-COV2 Booster Vaccination 07/03/2021   Moderna Sars-Covid-2  Vaccination 11/30/2019, 12/31/2019, 10/26/2020, 07/03/2021, 04/22/2022   Pneumococcal Conjugate-13 12/29/2013   Pneumococcal Polysaccharide-23 11/08/2015   Tdap 01/05/2015   Zoster Recombinat (Shingrix) 06/20/2021, 09/05/2021    TDAP status: Up to date  Flu Vaccine status: Up to date  Pneumococcal vaccine status: Up to date  Covid-19 vaccine status: Completed vaccines  Qualifies for Shingles Vaccine? Yes   Zostavax completed No   Shingrix Completed?: Yes  Screening Tests Health Maintenance  Topic Date Due   COLONOSCOPY (Pts 45-53yrs Insurance coverage will need to be confirmed)  06/04/2019   COVID-19 Vaccine (7 - 2023-24 season) 07/19/2022   INFLUENZA VACCINE  06/19/2023   Medicare Annual Wellness (AWV)  03/16/2024   DTaP/Tdap/Td (2 - Td or Tdap) 01/05/2025   Pneumonia Vaccine 45+ Years old  Completed   DEXA SCAN  Completed   Zoster Vaccines- Shingrix  Completed   HPV VACCINES  Aged Out    Health Maintenance  Health Maintenance Due  Topic Date Due   COLONOSCOPY (Pts 45-66yrs Insurance coverage will need to be confirmed)  06/04/2019   COVID-19 Vaccine (7 - 2023-24 season) 07/19/2022    Colorectal cancer screening: No longer required.   Mammogram status: Completed 09/11/2022. Repeat every year  Bone  Density status: Completed 04/27/2021. Results reflect: Bone density results: OSTEOPOROSIS. Repeat every 2 years.  Lung Cancer Screening: (Low Dose CT Chest recommended if Age 61-80 years, 30 pack-year currently smoking OR have quit w/in 15years.) does not qualify.   Lung Cancer Screening Referral: No  Additional Screening:  Hepatitis C Screening: does not qualify; Completed: No  Vision Screening: Recommended annual ophthalmology exams for early detection of glaucoma and other disorders of the eye. Is the patient up to date with their annual eye exam?  Yes  Who is the provider or what is the name of the office in which the patient attends annual eye exams? Antony Contras, MD. If pt is not established with a provider, would they like to be referred to a provider to establish care? No .   Dental Screening: Recommended annual dental exams for proper oral hygiene  Community Resource Referral / Chronic Care Management: CRR required this visit?  No   CCM required this visit?  No      Plan:     I have personally reviewed and noted the following in the patient's chart:   Medical and social history Use of alcohol, tobacco or illicit drugs  Current medications and supplements including opioid prescriptions. Patient is not currently taking opioid prescriptions. Functional ability and status Nutritional status Physical activity Advanced directives List of other physicians Hospitalizations, surgeries, and ER visits in previous 12 months Vitals Screenings to include cognitive, depression, and falls Referrals and appointments  In addition, I have reviewed and discussed with patient certain preventive protocols, quality metrics, and best practice recommendations. A written personalized care plan for preventive services as well as general preventive health recommendations were provided to patient.     Mickeal Needy, LPN   1/61/0960   Nurse Notes:  Normal cognitive status assessed by direct observation via phone conversation by this Nurse Health Advisor. No abnormalities found.    Medical screening examination/treatment/procedure(s) were performed by non-physician practitioner and as supervising physician I was immediately available for consultation/collaboration.  I agree with above. Jacinta Shoe, MD

## 2023-03-18 ENCOUNTER — Ambulatory Visit (INDEPENDENT_AMBULATORY_CARE_PROVIDER_SITE_OTHER): Payer: Medicare Other | Admitting: Internal Medicine

## 2023-03-18 ENCOUNTER — Encounter: Payer: Self-pay | Admitting: Internal Medicine

## 2023-03-18 VITALS — BP 85/60 | HR 61 | Temp 98.0°F | Ht <= 58 in | Wt 116.0 lb

## 2023-03-18 DIAGNOSIS — M47816 Spondylosis without myelopathy or radiculopathy, lumbar region: Secondary | ICD-10-CM | POA: Insufficient documentation

## 2023-03-18 DIAGNOSIS — I951 Orthostatic hypotension: Secondary | ICD-10-CM

## 2023-03-18 DIAGNOSIS — R400 Somnolence: Secondary | ICD-10-CM | POA: Diagnosis not present

## 2023-03-18 DIAGNOSIS — I10 Essential (primary) hypertension: Secondary | ICD-10-CM

## 2023-03-18 DIAGNOSIS — R270 Ataxia, unspecified: Secondary | ICD-10-CM | POA: Diagnosis not present

## 2023-03-18 DIAGNOSIS — E559 Vitamin D deficiency, unspecified: Secondary | ICD-10-CM | POA: Diagnosis not present

## 2023-03-18 DIAGNOSIS — R0989 Other specified symptoms and signs involving the circulatory and respiratory systems: Secondary | ICD-10-CM | POA: Diagnosis not present

## 2023-03-18 DIAGNOSIS — M5416 Radiculopathy, lumbar region: Secondary | ICD-10-CM | POA: Insufficient documentation

## 2023-03-18 DIAGNOSIS — I5189 Other ill-defined heart diseases: Secondary | ICD-10-CM

## 2023-03-18 DIAGNOSIS — R202 Paresthesia of skin: Secondary | ICD-10-CM | POA: Diagnosis not present

## 2023-03-18 DIAGNOSIS — M419 Scoliosis, unspecified: Secondary | ICD-10-CM | POA: Insufficient documentation

## 2023-03-18 DIAGNOSIS — R531 Weakness: Secondary | ICD-10-CM | POA: Diagnosis not present

## 2023-03-18 LAB — COMPREHENSIVE METABOLIC PANEL
ALT: 12 U/L (ref 0–35)
AST: 19 U/L (ref 0–37)
Albumin: 4 g/dL (ref 3.5–5.2)
Alkaline Phosphatase: 59 U/L (ref 39–117)
BUN: 18 mg/dL (ref 6–23)
CO2: 26 mEq/L (ref 19–32)
Calcium: 9.3 mg/dL (ref 8.4–10.5)
Chloride: 108 mEq/L (ref 96–112)
Creatinine, Ser: 0.81 mg/dL (ref 0.40–1.20)
GFR: 67.32 mL/min (ref >60.00)
Glucose, Bld: 103 mg/dL — ABNORMAL HIGH (ref 70–99)
Potassium: 4.1 mEq/L (ref 3.5–5.1)
Sodium: 142 mEq/L (ref 135–145)
Total Bilirubin: 0.8 mg/dL (ref 0.2–1.2)
Total Protein: 6.5 g/dL (ref 6.0–8.3)

## 2023-03-18 LAB — VITAMIN B12: Vitamin B-12: 589 pg/mL (ref 211–911)

## 2023-03-18 LAB — TSH: TSH: 1.2 u[IU]/mL (ref 0.35–5.50)

## 2023-03-18 LAB — CORTISOL: Cortisol, Plasma: 8 ug/dL

## 2023-03-18 LAB — T4, FREE: Free T4: 1.01 ng/dL (ref 0.60–1.60)

## 2023-03-18 LAB — VITAMIN D 25 HYDROXY (VIT D DEFICIENCY, FRACTURES): VITD: 80.23 ng/mL (ref 30.00–100.00)

## 2023-03-18 LAB — SEDIMENTATION RATE: Sed Rate: 20 mm/hr (ref 0–30)

## 2023-03-18 MED ORDER — ASPIRIN 81 MG PO TBEC: 81.0000 mg | DELAYED_RELEASE_TABLET | Freq: Every day | ORAL | 3 refills | Status: DC

## 2023-03-18 MED ORDER — LORATADINE 10 MG PO TABS: 10.0000 mg | ORAL_TABLET | Freq: Every day | ORAL | 3 refills | Status: DC

## 2023-03-18 MED ORDER — LOSARTAN POTASSIUM 50 MG PO TABS: 25.0000 mg | ORAL_TABLET | Freq: Two times a day (BID) | ORAL | 11 refills | Status: DC

## 2023-03-18 NOTE — Assessment & Plan Note (Addendum)
Check labs, UA, ESR R/o UTI R/o CVA

## 2023-03-18 NOTE — Assessment & Plan Note (Signed)
D/c Losartan Hydrate well May need to d/c Norvasc Use rollator walker

## 2023-03-18 NOTE — Assessment & Plan Note (Signed)
R/o CVA 3 wks ago Head CT Start baby ASA

## 2023-03-18 NOTE — Progress Notes (Signed)
Subjective:  Patient ID: [name], female    DOB: April 29, 1941  Age: 82 y.o. MRN: [id number]  CC: Follow-up (Frequent Panic attacks, Increased weakness in legs, fell down steps)   HPI [name] presents for being tired x 2 weeks. Eating makes her more tired. Sleepy C/o leg weakness - pt fell on the stairs (no LOC). Sleeping a lot. Heavy legs... No on Miralax... BP is low now R side is weaker x 3 weeks Very orthostaic  Outpatient Medications Prior to Visit  Medication Sig Dispense Refill   b complex vitamins capsule Take 1 capsule by mouth daily.     Cholecalciferol (VITAMIN D) 50 MCG (2000 UT) CAPS Take 1 capsule by mouth daily.     denosumab (PROLIA) 60 MG/ML SOSY injection Inject 60 mg into the skin every 6 (six) months.     cetirizine (ZYRTEC) 10 MG chewable tablet Chew 10 mg by mouth daily.     amLODipine (NORVASC) 2.5 MG tablet Take 1 tablet (2.5 mg total) by mouth at bedtime. (Patient not taking: Reported on 03/12/2023) 90 tablet 3   magnesium oxide (MAG-OX) 400 (240 Mg) MG tablet Take 400 mg by mouth at bedtime. (Patient not taking: Reported on 03/12/2023)     losartan (COZAAR) 50 MG tablet Take 1 tablet (50 mg total) by mouth in the morning and at bedtime. (Patient not taking: Reported on 03/12/2023) 60 tablet 11   mirabegron ER (MYRBETRIQ) 25 MG TB24 tablet Take 25 mg by mouth daily. (Patient not taking: Reported on 03/12/2023)     No facility-administered medications prior to visit.    ROS: Review of Systems  Constitutional:  Positive for fatigue. Negative for activity change, appetite change, chills and unexpected weight change.  HENT:  Negative for congestion, mouth sores and sinus pressure.   Eyes:  Negative for visual disturbance.  Respiratory:  Negative for cough and chest tightness.   Gastrointestinal:  Negative for abdominal pain and nausea.  Genitourinary:  Negative for difficulty urinating, frequency and vaginal pain.  Musculoskeletal:  Positive for back  pain and gait problem.  Skin:  Negative for pallor and rash.  Neurological:  Positive for weakness. Negative for dizziness, tremors, numbness and headaches.  Psychiatric/Behavioral:  Negative for confusion, self-injury and sleep disturbance.     Objective:  BP (!) 85/60 (Patient Position: Standing)   Pulse 61   Temp 98 F (36.7 C) (Oral)   Ht 4\' 10"  (1.473 m)   Wt 116 lb (52.6 kg)   SpO2 95%   BMI 24.24 kg/m   BP Readings from Last 3 Encounters:  03/18/23 (!) 85/60  02/26/23 (!) 170/90  01/08/23 (!) 150/78    Wt Readings from Last 3 Encounters:  03/18/23 116 lb (52.6 kg)  03/17/23 114 lb (51.7 kg)  02/26/23 113 lb 8 oz (51.5 kg)    Physical Exam Constitutional:      General: She is not in acute distress.    Appearance: She is well-developed.  HENT:     Head: Normocephalic.     Right Ear: External ear normal.     Left Ear: External ear normal.     Nose: Nose normal.  Eyes:     General:        Right eye: No discharge.        Left eye: No discharge.     Conjunctiva/sclera: Conjunctivae normal.     Pupils: Pupils are equal, round, and reactive to light.  Neck:     Thyroid:  No thyromegaly.     Vascular: No JVD.     Trachea: No tracheal deviation.  Cardiovascular:     Rate and Rhythm: Normal rate and regular rhythm.     Heart sounds: Normal heart sounds. No murmur heard. Pulmonary:     Effort: No respiratory distress.     Breath sounds: No stridor. No wheezing.  Abdominal:     General: Bowel sounds are normal. There is no distension.     Palpations: Abdomen is soft. There is no mass.     Tenderness: There is no abdominal tenderness. There is no guarding or rebound.  Musculoskeletal:        General: No tenderness.     Cervical back: Normal range of motion and neck supple. No rigidity.  Lymphadenopathy:     Cervical: No cervical adenopathy.  Skin:    Findings: No erythema or rash.  Neurological:     Cranial Nerves: No cranial nerve deficit.     Motor: No  abnormal muscle tone.     Coordination: Coordination normal.     Deep Tendon Reflexes: Reflexes normal.  Psychiatric:        Behavior: Behavior normal.        Thought Content: Thought content normal.        Judgment: Judgment normal.   Subtle R pronator drift Unsteady gait R grip 5/-/5 DTRs OK  Lab Results  Component Value Date   WBC 6.3 04/19/2022   HGB 13.8 04/19/2022   HCT 40.6 04/19/2022   PLT 172 04/19/2022   GLUCOSE 87 11/22/2022   CHOL 132 01/25/2015   TRIG 64 01/25/2015   HDL 68 01/25/2015   LDLDIRECT 156.3 03/28/2011   LDLCALC 51 01/25/2015   ALT 15 11/22/2022   AST 21 11/22/2022   NA 141 11/22/2022   K 3.9 11/22/2022   CL 107 11/22/2022   CREATININE 0.77 11/22/2022   BUN 17 11/22/2022   CO2 26 11/22/2022   TSH 1.42 01/30/2021   INR 1.0 04/01/2017    US Abdomen Limited RUQ (LIVER/GB)  Result Date: 10/03/2022 CLINICAL DATA:  Right upper quadrant pain EXAM: ULTRASOUND ABDOMEN LIMITED RIGHT UPPER QUADRANT COMPARISON:  Abdominal radiograph January 29, 2022 FINDINGS: Gallbladder: No gallstones or wall thickening visualized. No sonographic Murphy sign noted by sonographer. Common bile duct: Diameter: 2 mm Liver: No focal lesion identified. Within normal limits in parenchymal echogenicity. Portal vein is patent on color Doppler imaging with normal direction of blood flow towards the liver. Other: None. IMPRESSION: No cholelithiasis or sonographic evidence for acute cholecystitis. Electronically Signed   By: Annia Belt M.D.   On: 10/03/2022 11:35    Assessment & Plan:   Problem List Items Addressed This Visit     Essential hypertension (Chronic)    D/c Losartan Hydrate well May need to d/c Norvasc Use rollator walker      Relevant Medications   aspirin EC 81 MG tablet   loratadine (CLARITIN) 10 MG tablet   Other Relevant Orders   Comprehensive metabolic panel   Cortisol   TSH   Sedimentation rate   Urinalysis   Vitamin B12   VITAMIN D 25 Hydroxy (Vit-D  Deficiency, Fractures)   T4, free   Orthostatic hypotension - Primary    D/c Losartan Hydrate well May need to d/c Norvasc Use rollator walker       Relevant Medications   aspirin EC 81 MG tablet   loratadine (CLARITIN) 10 MG tablet   Other Relevant Orders   Comprehensive  metabolic panel   Cortisol   TSH   Sedimentation rate   Urinalysis   Vitamin B12   T4, free   Diastolic dysfunction   Labile blood pressure    See above      Right sided weakness    R/o CVA 3 wks ago Head CT Start baby ASA      Somnolence, daytime    Check labs, UA, ESR R/o UTI R/o CVA      Other Visit Diagnoses     Vitamin D deficiency       Relevant Orders   VITAMIN D 25 Hydroxy (Vit-D Deficiency, Fractures)   Paresthesia       Relevant Orders   Vitamin B12         Meds ordered this encounter  Medications   DISCONTD: losartan (COZAAR) 50 MG tablet    Sig: Take 0.5 tablets (25 mg total) by mouth in the morning and at bedtime.    Dispense:  60 tablet    Refill:  11   aspirin EC 81 MG tablet    Sig: Take 1 tablet (81 mg total) by mouth daily.    Dispense:  100 tablet    Refill:  3   loratadine (CLARITIN) 10 MG tablet    Sig: Take 1 tablet (10 mg total) by mouth daily.    Dispense:  100 tablet    Refill:  3      Follow-up: No follow-ups on file.  Sonda Primes, MD

## 2023-03-18 NOTE — Assessment & Plan Note (Signed)
See above

## 2023-03-18 NOTE — Patient Instructions (Addendum)
Stop Losartan Hydrate well May need to stop Norvasc Use your walker Baby aspirin daily

## 2023-03-18 NOTE — Assessment & Plan Note (Signed)
D/c Losartan Hydrate well May need to d/c Norvasc Use rollator walker 

## 2023-03-19 ENCOUNTER — Other Ambulatory Visit: Payer: Self-pay | Admitting: Internal Medicine

## 2023-03-19 ENCOUNTER — Other Ambulatory Visit: Payer: Medicare Other

## 2023-03-19 ENCOUNTER — Ambulatory Visit
Admission: RE | Admit: 2023-03-19 | Discharge: 2023-03-19 | Disposition: A | Discharge status: Home / Self Care | Payer: Medicare Other | Source: Ambulatory Visit | Attending: Internal Medicine | Admitting: Internal Medicine

## 2023-03-19 DIAGNOSIS — R270 Ataxia, unspecified: Secondary | ICD-10-CM | POA: Diagnosis not present

## 2023-03-19 DIAGNOSIS — R531 Weakness: Secondary | ICD-10-CM | POA: Diagnosis not present

## 2023-03-19 DIAGNOSIS — R202 Paresthesia of skin: Secondary | ICD-10-CM

## 2023-03-19 DIAGNOSIS — R400 Somnolence: Secondary | ICD-10-CM

## 2023-03-19 LAB — URINALYSIS, ROUTINE W REFLEX MICROSCOPIC
Bilirubin Urine: NEGATIVE
Nitrite: NEGATIVE
Specific Gravity, Urine: 1.025 (ref 1.000–1.030)
Total Protein, Urine: NEGATIVE
Urine Glucose: NEGATIVE
Urobilinogen, UA: 1 (ref 0.0–1.0)
pH: 6 (ref 5.0–8.0)

## 2023-03-19 MED ORDER — NITROFURANTOIN MONOHYD MACRO 100 MG PO CAPS
100.0000 mg | ORAL_CAPSULE | Freq: Two times a day (BID) | ORAL | 0 refills | Status: DC
Start: 2023-03-19 — End: 2023-04-15

## 2023-03-26 ENCOUNTER — Ambulatory Visit: Payer: Medicare Other | Attending: Internal Medicine | Admitting: Physical Therapy

## 2023-03-26 DIAGNOSIS — R293 Abnormal posture: Secondary | ICD-10-CM

## 2023-03-26 DIAGNOSIS — R279 Unspecified lack of coordination: Secondary | ICD-10-CM | POA: Diagnosis not present

## 2023-03-26 DIAGNOSIS — M6281 Muscle weakness (generalized): Secondary | ICD-10-CM | POA: Diagnosis not present

## 2023-03-26 NOTE — Therapy (Signed)
OUTPATIENT PHYSICAL THERAPY FEMALE PELVIC EVALUATION   Patient Name: [name] MRN: [id number] DOB:05/21/1941, 82 y.o., female Today's Date: 03/26/2023  END OF SESSION:  PT End of Session - 03/26/23 1539     Visit Number 2    Date for PT Re-Evaluation 06/11/23    Authorization Type Medicare    Progress Note Due on Visit 10    PT Start Time 1534    PT Stop Time 1612    PT Time Calculation (min) 38 min    Activity Tolerance Patient tolerated treatment well    Behavior During Therapy WFL for tasks assessed/performed             Past Medical History:  Diagnosis Date   Abdominal pain, unspecified site    ADVERSE REACTION TO MEDICATION 07/31/2009   ANEMIA-NOS 07/07/2007   Arthritis    Chest pain, unspecified    Chronic kidney disease    hx of kidney stone   COLONIC POLYPS, ADENOMATOUS, HX OF    Complication of anesthesia    Disturbance of skin sensation    DIVERTICULOSIS, COLON 02/23/2009   Family history of diabetes mellitus    Family history of malignant neoplasm of breast    FATIGUE 05/25/2009   Fever, unspecified    Heart murmur    hx of   HYPERLIPIDEMIA 07/07/2007   HYPERPARATHYROIDISM UNSPECIFIED 10/20/2007   HYPERTENSION 02/04/2008   Irritable bowel syndrome (IBS)    NEPHROLITHIASIS, HX OF 11/15/2008   OSTEOPOROSIS 01/01/2008   Other acquired absence of organ    parathyroidectomy   Pain in limb    PALPITATIONS, OCCASIONAL 07/12/2008   PARATHYROIDECTOMY 01/02/2009   PONV (postoperative nausea and vomiting)    SCOLIOSIS 05/25/2009   Seasonal allergies    Unspecified constipation    URINARY INCONTINENCE 07/07/2007   UTI'S, RECURRENT 11/20/2009   kimbrough    Past Surgical History:  Procedure Laterality Date   BUNIONECTOMY     CATARACT EXTRACTION, BILATERAL     COLONOSCOPY     EXTRACORPOREAL SHOCK WAVE LITHOTRIPSY Left 12/21/2020   Procedure: EXTRACORPOREAL SHOCK WAVE LITHOTRIPSY (ESWL);  Surgeon: Jerilee Field, MD;  Location: [facility];   Service: Urology;  Laterality: Left;   LAPAROSCOPIC APPENDECTOMY  01/19/2016   Procedure: APPENDECTOMY LAPAROSCOPIC with orifice of cecal polyp;  Surgeon: Romie Levee, MD;  Location: [facility];  Service: General;;   LEFT HEART CATH AND CORONARY ANGIOGRAPHY N/A 04/07/2017   Procedure: Left Heart Cath and Coronary Angiography;  Surgeon: [name], MD;  Location: [facility];  Service: Cardiovascular;  Laterality: N/A;   PARATHYROIDECTOMY     Rt Superior open neck exploration   POLYPECTOMY     RIGHT OOPHORECTOMY     TONSILLECTOMY     Patient Active Problem List   Diagnosis Date Noted   Lumbar radiculopathy 03/18/2023   Lumbar spondylosis 03/18/2023   Scoliosis deformity of spine 03/18/2023   Right sided weakness 03/18/2023   Somnolence, daytime 03/18/2023   Chronic bilateral low back pain without sciatica [M54.50, G89.29] 01/08/2023   Edema 01/08/2023   Ankle pain, left 11/22/2022   Other specified abnormal immunological findings in serum 04/03/2022   Pathological fracture of vertebra 04/03/2022   URI (upper respiratory infection) 04/03/2022   Allergic rhinitis 04/03/2022   Cough 04/03/2022   Anxiety disorder 06/12/2021   Unsteady gait when walking 01/19/2021   Diplopia 01/19/2021   Late onset Alzheimer's dementia without behavioral disturbance (HCC) 01/19/2021   Chronic idiopathic constipation 01/19/2021   Labile  blood pressure 01/19/2021   Acute parotitis 03/09/2019   Coronary artery disease involving native coronary artery of native heart without angina pectoris 04/28/2017   Syncope 04/07/2017   Shortness of breath 04/07/2017   Abnormal stress test 04/07/2017   Sinus bradycardia 02/21/2017   Coronary artery calcification 02/21/2017   Compression fracture of body of thoracic vertebra (HCC) 02/08/2017   Dizziness and giddiness 02/08/2017   Atrial fibrillation with rapid ventricular response (HCC) 01/21/2016   Colon polyp s/p appy/partialcecetomy 01/19/3016 01/19/2016    Orthostatic dizziness 04/25/2014   Vertigo 12/26/2012   Hypertension, uncontrolled 09/11/2012   Diastolic dysfunction 09/11/2012   Sleep disturbance 09/17/2011   Hypotension 12/26/2010   Orthostatic hypotension 12/26/2010   UTI'S, RECURRENT 11/20/2009   ADVERSE REACTION TO MEDICATION 07/31/2009   SCOLIOSIS 05/25/2009   FATIGUE 05/25/2009   DIVERTICULOSIS, COLON 02/23/2009   COLONIC POLYPS, ADENOMATOUS, HX OF 02/23/2009   PARATHYROIDECTOMY 01/02/2009   NEPHROLITHIASIS, HX OF 11/15/2008   PALPITATIONS, OCCASIONAL 07/12/2008   Essential hypertension 02/04/2008   Constipation 01/01/2008   LEG PAIN, LEFT 01/01/2008   Osteoporosis 01/01/2008   Thoracic back pain 12/04/2007   HYPERPARATHYROIDISM UNSPECIFIED 10/20/2007   HYPERLIPIDEMIA 07/07/2007   ANEMIA-NOS 07/07/2007   URINARY INCONTINENCE 07/07/2007    PCP: Tresa Garter, MD  REFERRING PROVIDER: Napoleon Form, MD  REFERRING DIAG: K79.02 (ICD-10-CM) - Dyssynergic defecation  THERAPY DIAG:  Muscle weakness (generalized)  Abnormal posture  Unspecified lack of coordination  Rationale for Evaluation and Treatment: Rehabilitation  ONSET DATE: years  SUBJECTIVE:                                                                                                                                                                                           SUBJECTIVE STATEMENT: Pt reports she has increased water and been doing abdominal massage without medication to have bowel movement and now going 3x per week without any leakage. Having type 4 stools and no straining.     Fluid intake: Yes: water - 30oz usually a day    PAIN:  Are you having pain? No  PRECAUTIONS: None  WEIGHT BEARING RESTRICTIONS: No  FALLS:  Has patient fallen in last 6 months? Yes. Number of falls a few days ago, fell on a step - reports difficulties with Rt leg sometimes  LIVING ENVIRONMENT: Lives with: alone - in I living  Lives in:  House/apartment   OCCUPATION: retired   PLOF: Independent  PATIENT GOALS: to have less leakage  PERTINENT HISTORY:  history of large tubulovillous adenoma in cecum status post endoscopic removal, was extending into appendiceal orifice with incomplete removal status post extended appendectomy  and partial cecotomy March 2017. Chronic irritable bowel syndrome with alternating constipation and diarrhea. severe sigmoid diverticulosis and internal hemorrhoids.  Sexual abuse: No  BOWEL MOVEMENT: Pain with bowel movement: No Type of bowel movement:Type (Bristol Stool Scale) 1-7, Frequency sometimes goes a week without having bowel movement then goes for a few days, and Strain No Fully empty rectum: Yes:   Leakage: Yes:   Pads: Yes: small pad for urinary leakage and wears a depend when she is going a lot Fiber supplement: No but does take mirilax and prunes   URINATION: Pain with urination: No Fully empty bladder: Yes:   Stream: Strong and Weak Urgency: Yes:   Frequency: right at 2 hours Leakage: Urge to void, Coughing, Sneezing, Laughing, Exercise, and Bending forward Pads: Yes:    INTERCOURSE: Pain with intercourse:  not active   PREGNANCY: Vaginal deliveries 3 Tearing Yes: thinks so with all three C-section deliveries 0 Currently pregnant No  PROLAPSE: None   OBJECTIVE:   DIAGNOSTIC FINDINGS:     COGNITION: Overall cognitive status: Within functional limits for tasks assessed     SENSATION: Light touch: Appears intact Proprioception: Appears intact  MUSCLE LENGTH: Bil hamstrings and adductors limited by 50%   GAIT: Distance walked: 200' Assistive device utilized: None Level of assistance: Complete Independence Comments: decreased cadence, decreased stride length, decreased Rt step height  POSTURE: rounded shoulders, forward head, increased thoracic kyphosis, posterior pelvic tilt, and right lateral lean  PELVIC ALIGNMENT: Rt pelvic obliquity    LUMBARAROM/PROM:  A/PROM A/PROM  eval  Flexion Decreased by 75%  Extension Decreased by 50%  Right lateral flexion Decreased by 75%  Left lateral flexion Decreased by 75%  Right rotation Decreased by 75%  Left rotation Decreased by 75%   (Blank rows = not tested)  LOWER EXTREMITY ROM:  WFL  LOWER EXTREMITY MMT:  Rt hip grossly 3+/5 except flexion 3/5; Lt hip grossly 3+/5, knees 4/5 PALPATION:   General  no TTP but does have noted fascial restrictions in all quadrants of abdomen                External Perineal Exam pt deferred                             Internal Pelvic Floor pt deferred  Patient confirms identification and approves PT to assess internal pelvic floor and treatment No  PELVIC MMT:   MMT eval  Vaginal   Internal Anal Sphincter   External Anal Sphincter   Puborectalis   Diastasis Recti   (Blank rows = not tested)        TONE: Pt deferred  PROLAPSE: Pt deferred  TODAY'S TREATMENT:                                                                                                                              DATE:   03/12/2023 EVAL Examination completed, findings  reviewed, pt educated on POC, abdominal massage and voiding mechanics. Pt motivated to participate in PT and agreeable to attempt recommendations.    03/26/23  Pt educated urge drill and given handout. Completed in sitting x2 HEP given AENJNADL - reviewed with pt and pt completed x2 reps of all exercises to improve carry over.  X5 pelvic floor contractions in sitting and x5 in supine - pt reports she feels them more in sitting, x5 with towel roll in sitting for improved awareness of contraction   PATIENT EDUCATION:  Education details: abdominal massage and voiding mechanics  AENJNADL Person educated: Patient Education method: Explanation, Demonstration, Tactile cues, Verbal cues, and Handouts Education comprehension: verbalized understanding and returned demonstration  HOME EXERCISE  PROGRAM: abdominal massage and voiding mechanics  ASSESSMENT:  CLINICAL IMPRESSION: Patient presents for treatment, reports improvement with constipation to now going 3x per week and doing abdominal massage daily. Pt pleased with progress so far. Pt session focused on education on treatment focusing on urinary leakage with urge and stressors, given HEP and urge drill all reviewed. Pt denied additional questions at end of session. Pt would benefit from additional PT to further address deficits.    OBJECTIVE IMPAIRMENTS: decreased coordination, decreased endurance, decreased mobility, difficulty walking, decreased strength, increased fascial restrictions, impaired flexibility, improper body mechanics, and postural dysfunction.   ACTIVITY LIMITATIONS: continence  PARTICIPATION LIMITATIONS: shopping and community activity  PERSONAL FACTORS: Time since onset of injury/illness/exacerbation are also affecting patient's functional outcome.   REHAB POTENTIAL: Good  CLINICAL DECISION MAKING: Stable/uncomplicated  EVALUATION COMPLEXITY: Low   GOALS: Goals reviewed with patient? Yes  SHORT TERM GOALS: Target date: 04/09/23  Pt to be I with HEP.  Baseline: Goal status: INITIAL  2.  Pt to be I with voiding and breathing mechanics and abdominal massage for improved bowel habits.  Baseline:  Goal status: INITIAL  3.  Pt to be I with urge drill and knack method to improve urinary leakage.  Baseline:  Goal status: INITIAL   LONG TERM GOALS: Target date: 06/11/23  Pt to be I with advanced HEP.  Baseline:  Goal status: INITIAL  2.  Pt to demonstrate at least 5/5 bil hip strength for improved pelvic stability and functional squats without leakage.  Baseline:  Goal status: INITIAL  3.  Pt to demonstrate improved coordination of pelvic floor and breathing mechanics with squat with appropriate synergistic patterns to decrease pain and leakage at least 75% of the time.    Baseline:  Goal  status: INITIAL  4.  Pt will report 3 BMs per week due to improved muscle tone and coordination with BMs.  Baseline:  Goal status: INITIAL  5.  Pt will report her BMs are complete due to improved bowel habits and evacuation techniques.  Baseline:  Goal status: INITIAL  6.  Pt to demonstrate at least 3/5 pelvic floor strength and holds for at least 8s for improved pelvic stability and decreased strain at pelvic floor/ decrease leakage.  Baseline:  Goal status: INITIAL  PLAN:  PT FREQUENCY: every other week  PT DURATION:  6 sessions  PLANNED INTERVENTIONS: Therapeutic exercises, Therapeutic activity, Neuromuscular re-education, Balance training, Patient/Family education, Self Care, Joint mobilization, Dry Needling, Electrical stimulation, Spinal mobilization, Cryotherapy, Moist heat, scar mobilization, Biofeedback, and Manual therapy  PLAN FOR NEXT SESSION: internal if needed and pt consents, breathing and voiding mechanics, urge drill, knack, coordination of pelvic floor and breathing/core and exercises  Otelia Sergeant, PT, DPT 05/08/244:48 PM

## 2023-03-26 NOTE — Patient Instructions (Signed)

## 2023-03-31 ENCOUNTER — Encounter: Payer: Self-pay | Admitting: Internal Medicine

## 2023-03-31 ENCOUNTER — Ambulatory Visit (INDEPENDENT_AMBULATORY_CARE_PROVIDER_SITE_OTHER): Payer: Medicare Other | Admitting: Internal Medicine

## 2023-03-31 VITALS — BP 158/82 | HR 59 | Temp 99.4°F | Ht <= 58 in | Wt 114.0 lb

## 2023-03-31 DIAGNOSIS — I10 Essential (primary) hypertension: Secondary | ICD-10-CM

## 2023-03-31 DIAGNOSIS — R6883 Chills (without fever): Secondary | ICD-10-CM

## 2023-03-31 DIAGNOSIS — F411 Generalized anxiety disorder: Secondary | ICD-10-CM

## 2023-03-31 DIAGNOSIS — N3090 Cystitis, unspecified without hematuria: Secondary | ICD-10-CM | POA: Diagnosis not present

## 2023-03-31 DIAGNOSIS — R0989 Other specified symptoms and signs involving the circulatory and respiratory systems: Secondary | ICD-10-CM | POA: Diagnosis not present

## 2023-03-31 LAB — URINALYSIS, ROUTINE W REFLEX MICROSCOPIC
Bilirubin Urine: NEGATIVE
Hgb urine dipstick: NEGATIVE
Ketones, ur: NEGATIVE
Nitrite: NEGATIVE
RBC / HPF: NONE SEEN (ref 0–?)
Specific Gravity, Urine: 1.025 (ref 1.000–1.030)
Total Protein, Urine: NEGATIVE
Urine Glucose: NEGATIVE
Urobilinogen, UA: 0.2 (ref 0.0–1.0)
pH: 6 (ref 5.0–8.0)

## 2023-03-31 MED ORDER — LOSARTAN POTASSIUM 50 MG PO TABS: 25.0000 mg | ORAL_TABLET | Freq: Two times a day (BID) | ORAL | 11 refills | Status: DC

## 2023-03-31 MED ORDER — LORAZEPAM 0.5 MG PO TABS: 0.5000 mg | ORAL_TABLET | Freq: Two times a day (BID) | ORAL | 3 refills | Status: DC | PRN

## 2023-03-31 NOTE — Assessment & Plan Note (Addendum)
Diazepam as needed low-dose - d/c  Potential benefits of a long term benzodiazepines  use as well as potential risks  and complications were explained to the patient and were aknowledged. Will try Lorazepam low dose.

## 2023-03-31 NOTE — Assessment & Plan Note (Signed)
Resolved off BP med

## 2023-03-31 NOTE — Assessment & Plan Note (Signed)
Start Losartan 25 mg bid D/c Norvasc

## 2023-03-31 NOTE — Patient Instructions (Signed)
Check temperature at home  Try Tylenol for chills

## 2023-03-31 NOTE — Progress Notes (Signed)
Subjective:  Patient ID: [name], female    DOB: 1941/07/15  Age: 82 y.o. MRN: [id number]  CC: No chief complaint on file.   HPI [name] presents for shivering, feeling shaky in am, weak, cold sweats - day, night Pt took the abx for a UTI - not feeling better (finished yesterday) Worse after breakfast  Outpatient Medications Prior to Visit  Medication Sig Dispense Refill   aspirin EC 81 MG tablet Take 1 tablet (81 mg total) by mouth daily. 100 tablet 3   Cholecalciferol (VITAMIN D) 50 MCG (2000 UT) CAPS Take 1 capsule by mouth daily.     denosumab (PROLIA) 60 MG/ML SOSY injection Inject 60 mg into the skin every 6 (six) months.     nitrofurantoin, macrocrystal-monohydrate, (MACROBID) 100 MG capsule Take 1 capsule (100 mg total) by mouth 2 (two) times daily. (Patient not taking: Reported on 03/31/2023) 20 capsule 0   b complex vitamins capsule Take 1 capsule by mouth daily. (Patient not taking: Reported on 03/31/2023)     loratadine (CLARITIN) 10 MG tablet Take 1 tablet (10 mg total) by mouth daily. (Patient not taking: Reported on 03/31/2023) 100 tablet 3   magnesium oxide (MAG-OX) 400 (240 Mg) MG tablet Take 400 mg by mouth at bedtime. (Patient not taking: Reported on 03/12/2023)     amLODipine (NORVASC) 2.5 MG tablet Take 1 tablet (2.5 mg total) by mouth at bedtime. (Patient not taking: Reported on 03/12/2023) 90 tablet 3   No facility-administered medications prior to visit.    ROS: Review of Systems  Constitutional:  Negative for activity change, appetite change, chills, fatigue and unexpected weight change.  HENT:  Negative for congestion, mouth sores and sinus pressure.   Eyes:  Negative for visual disturbance.  Respiratory:  Negative for cough and chest tightness.   Gastrointestinal:  Negative for abdominal pain and nausea.  Genitourinary:  Negative for difficulty urinating, frequency and vaginal pain.  Musculoskeletal:  Positive for gait problem. Negative for back  pain.  Skin:  Negative for pallor and rash.  Neurological:  Negative for dizziness, tremors, weakness, numbness and headaches.  Hematological:  Does not bruise/bleed easily.  Psychiatric/Behavioral:  Negative for confusion, sleep disturbance and suicidal ideas. The patient is nervous/anxious.     Objective:  BP (!) 158/82 (BP Location: Left Arm, Patient Position: Sitting, Cuff Size: Normal)   Pulse (!) 59   Temp 99.4 F (37.4 C) (Oral)   Ht 4\' 10"  (1.473 m)   Wt 114 lb (51.7 kg)   SpO2 96%   BMI 23.83 kg/m   BP Readings from Last 3 Encounters:  03/31/23 (!) 158/82  03/18/23 (!) 85/60  02/26/23 (!) 170/90    Wt Readings from Last 3 Encounters:  03/31/23 114 lb (51.7 kg)  03/18/23 116 lb (52.6 kg)  03/17/23 114 lb (51.7 kg)    Physical Exam Constitutional:      General: She is not in acute distress.    Appearance: Normal appearance. She is well-developed.  HENT:     Head: Normocephalic.     Right Ear: External ear normal.     Left Ear: External ear normal.     Nose: Nose normal.  Eyes:     General:        Right eye: No discharge.        Left eye: No discharge.     Conjunctiva/sclera: Conjunctivae normal.     Pupils: Pupils are equal, round, and reactive to light.  Neck:  Thyroid: No thyromegaly.     Vascular: No JVD.     Trachea: No tracheal deviation.  Cardiovascular:     Rate and Rhythm: Normal rate and regular rhythm.     Heart sounds: Normal heart sounds.  Pulmonary:     Effort: No respiratory distress.     Breath sounds: No stridor. No wheezing.  Abdominal:     General: Bowel sounds are normal. There is no distension.     Palpations: Abdomen is soft. There is no mass.     Tenderness: There is no abdominal tenderness. There is no guarding or rebound.  Musculoskeletal:        General: No tenderness.     Cervical back: Normal range of motion and neck supple. No rigidity.  Lymphadenopathy:     Cervical: No cervical adenopathy.  Skin:    Findings: No  erythema or rash.  Neurological:     Cranial Nerves: No cranial nerve deficit.     Motor: No abnormal muscle tone.     Coordination: Coordination normal.     Deep Tendon Reflexes: Reflexes normal.  Psychiatric:        Behavior: Behavior normal.        Thought Content: Thought content normal.        Judgment: Judgment normal.     Lab Results  Component Value Date   WBC 6.3 04/19/2022   HGB 13.8 04/19/2022   HCT 40.6 04/19/2022   PLT 172 04/19/2022   GLUCOSE 103 (H) 03/18/2023   CHOL 132 01/25/2015   TRIG 64 01/25/2015   HDL 68 01/25/2015   LDLDIRECT 156.3 03/28/2011   LDLCALC 51 01/25/2015   ALT 12 03/18/2023   AST 19 03/18/2023   NA 142 03/18/2023   K 4.1 03/18/2023   CL 108 03/18/2023   CREATININE 0.81 03/18/2023   BUN 18 03/18/2023   CO2 26 03/18/2023   TSH 1.20 03/18/2023   INR 1.0 04/01/2017    CT HEAD WO CONTRAST ( )  Result Date: 03/19/2023 CLINICAL DATA:  Ataxia, head trauma Ataxia, chronic, progressive (Ped 0-17y) R weakness x 3 wks, fell once. EXAM: CT HEAD WITHOUT CONTRAST TECHNIQUE: Contiguous axial images were obtained from the base of the skull through the vertex without intravenous contrast. RADIATION DOSE REDUCTION: This exam was performed according to the departmental dose-optimization program which includes automated exposure control, adjustment of the mA and/or kV according to patient size and/or use of iterative reconstruction technique. COMPARISON:  12/15/2020 FINDINGS: Brain: No acute intracranial abnormality. Specifically, no hemorrhage, hydrocephalus, mass lesion, acute infarction, or significant intracranial injury. Mild age related volume loss. Vascular: No hyperdense vessel or unexpected calcification. Skull: No acute calvarial abnormality. Sinuses/Orbits: No acute findings Other: None IMPRESSION: No acute intracranial abnormality. Electronically Signed   By: Charlett Nose M.D.   On: 03/19/2023 16:23    Assessment & Plan:   Problem List Items  Addressed This Visit     Essential hypertension (Chronic)    Hydrate well May need to d/c Norvasc Use rollator walker      Labile blood pressure    Start Losartan 25 mg bid D/c Norvasc      Anxiety disorder    Diazepam as needed low-dose - d/c  Potential benefits of a long term benzodiazepines  use as well as potential risks  and complications were explained to the patient and were aknowledged. Will try Lorazepam low dose.      Chills   Other Visit Diagnoses     Cystitis    -  Primary   Relevant Orders   Urinalysis   CULTURE, URINE COMPREHENSIVE         No orders of the defined types were placed in this encounter.     Follow-up: No follow-ups on file.  Sonda Primes, MD

## 2023-03-31 NOTE — Assessment & Plan Note (Addendum)
R/o UTI Cx and UA Just treated w/Macrobid Try Tylenol for chills Renal US if not better

## 2023-03-31 NOTE — Assessment & Plan Note (Signed)
Hydrate well May need to d/c Norvasc Use rollator walker

## 2023-04-01 DIAGNOSIS — Z85828 Personal history of other malignant neoplasm of skin: Secondary | ICD-10-CM | POA: Diagnosis not present

## 2023-04-01 DIAGNOSIS — Z5189 Encounter for other specified aftercare: Secondary | ICD-10-CM | POA: Diagnosis not present

## 2023-04-01 DIAGNOSIS — D692 Other nonthrombocytopenic purpura: Secondary | ICD-10-CM | POA: Diagnosis not present

## 2023-04-02 LAB — CULTURE, URINE COMPREHENSIVE

## 2023-04-09 ENCOUNTER — Telehealth: Payer: Self-pay | Admitting: Cardiology

## 2023-04-09 NOTE — Telephone Encounter (Signed)
Patient states her BP will be high and then drop really low.  It is 170's in the morning before meds, then will take meds and eat and then BP drops low.  During the low time, she will feel a cold sweat, nausea  Went to her PCP,  took off losartan and amlodipine.  Was off losartan for approx 1 week,  then returned to losartan to half strength because of increased BP. Losartan 25mg   Twice a day.  Restarted ASA 81mg  (per PCP) She states "I am out of commission each morning with the cold sweat." She states this is when BP is 116 or 110.  She is not sure if her BP drop is due to her meds or her eating.  She states her PCP told her not to eat breakfast.  I advised that she should eat in the morning.  She will keep a log of her BP and when she takes meds, and when she eats and then when BP drops, to see if any correlation.  She is scheduled for next week with Dr Fransisco Beau       -

## 2023-04-09 NOTE — Telephone Encounter (Signed)
Pt c/o BP issue: STAT if pt c/o blurred vision, one-sided weakness or slurred speech  1. What are your last 5 BP readings?  5/22: 175/106, 115/67  2. Are you having any other symptoms (ex. Dizziness, headache, blurred vision, passed out)?  Shakiness, nauseous, weakness, cold sweats   3. What is your BP issue?   BP has been elevated.

## 2023-04-15 ENCOUNTER — Ambulatory Visit (INDEPENDENT_AMBULATORY_CARE_PROVIDER_SITE_OTHER): Payer: Medicare Other | Admitting: Cardiology

## 2023-04-15 ENCOUNTER — Encounter (HOSPITAL_BASED_OUTPATIENT_CLINIC_OR_DEPARTMENT_OTHER): Payer: Self-pay | Admitting: Cardiology

## 2023-04-15 VITALS — BP 192/88 | HR 58 | Ht <= 58 in | Wt 115.5 lb

## 2023-04-15 DIAGNOSIS — I10 Essential (primary) hypertension: Secondary | ICD-10-CM | POA: Diagnosis not present

## 2023-04-15 DIAGNOSIS — R0989 Other specified symptoms and signs involving the circulatory and respiratory systems: Secondary | ICD-10-CM | POA: Diagnosis not present

## 2023-04-15 DIAGNOSIS — I4510 Unspecified right bundle-branch block: Secondary | ICD-10-CM

## 2023-04-15 DIAGNOSIS — E785 Hyperlipidemia, unspecified: Secondary | ICD-10-CM

## 2023-04-15 DIAGNOSIS — I2510 Atherosclerotic heart disease of native coronary artery without angina pectoris: Secondary | ICD-10-CM | POA: Diagnosis not present

## 2023-04-15 HISTORY — DX: Unspecified right bundle-branch block: I45.10

## 2023-04-15 NOTE — Patient Instructions (Addendum)
Try taking 25 mg losartan at lunchtime (not first food of the day) and in the evening. See if that makes you feel better. If that doesn't improve your mid morning symptoms, then we may try mid day losartan and PM amlodipine (low dose).  Follow up with Ronn Melena NP in 3 weeks

## 2023-04-15 NOTE — Progress Notes (Signed)
Cardiology Office Note:    Date:  04/15/2023   ID:  TARNI ALIAS, DOB [birth year], MRN [id number]  PCP:  Tresa Garter, MD  Cardiologist:  Jodelle Red, MD PhD  Referring MD: Tresa Garter, MD   CC: follow up  History of Present Illness:    [name] is a 82 y.o. female with a hx of HTN, CAD, HLD, paroxysmal atrial fibrillation (during hospitalization in 2017) who is seen for follow up today. I initially met her 07/19/2019 as a new patient to me/prior Dr. Okey Dupre for the evaluation and management of cardiovascular disease.  At her 08/2021 appointment, she complained of persistent cough, low stamina, and shortness of breath with mild exertion. She had noticed her blood pressure was typically lower in the mornings. Some very low readings associated with feeling ill and dizzy, but mostly 150s systolic. She took her morning medication around 10 AM, and her evening medication at 8-9 PM.   At her last visit, her blood pressure was initially 188/78, and 150/82 on recheck. Readings at home averaged in the 140s systolic. She complained of feeling shaky and wobbly at times, sometimes with weakness. She denied any falls. She had been working with a Psychologist, educational and enjoying her exercise program. We discussed intensifying her antihypertensives based on blood pressure, but she felt better with blood pressures in the 140s systolic. She preferred to keep this as her BP goal.   She called our office 04/09/2023 reporting labile blood pressures. In the morning prior to taking her medications her BP will be in the 170s. Then after taking the meds and eating her BP drops to the 110s associated with cold sweats, nausea, weakness, and tremors.  Today, she presents a BP log which is personally reviewed. She is struggling with labile blood pressures at home. In the mornings her blood pressure is typically high, and then drops significantly after eating. She has very low blood pressures with  associated nausea, tremors, and cold sweats. No headaches. Her BP has been as low as 85/60. She denies losing consciousness, although she does feel "shivery" and her vision worsens. By the afternoon her blood pressure will increase again and she will feel better.  Initially, her blood pressure is elevated to 213/79 in the office. This morning she took her antihypertensives at 9 AM. She is taking 1/2 tablets of her losartan for 25 mg in the morning and at night. On recheck her blood pressure is 192/88.  Additionally she states that she is unable to walk straight, "as if she had too much wine". In the past couple of weeks she fell down her stairs twice due to her right foot giving out on her. Fortunately she has only fallen a distance of 2-3 stairs. She complains of some LLE leg and foot swelling.  Despite attaining 9 hours of sleep at night, she is struggling with significant fatigue and daytime somnolence. She can fall asleep easily.  She denies any palpitations, chest pain, shortness of breath, headaches, orthopnea, or PND.   Past Medical History:  Diagnosis Date   Abdominal pain, unspecified site    ADVERSE REACTION TO MEDICATION 07/31/2009   ANEMIA-NOS 07/07/2007   Arthritis    Chest pain, unspecified    Chronic kidney disease    hx of kidney stone   COLONIC POLYPS, ADENOMATOUS, HX OF    Complication of anesthesia    Disturbance of skin sensation    DIVERTICULOSIS, COLON 02/23/2009   Family history of diabetes mellitus  Family history of malignant neoplasm of breast    FATIGUE 05/25/2009   Fever, unspecified    Heart murmur    hx of   HYPERLIPIDEMIA 07/07/2007   HYPERPARATHYROIDISM UNSPECIFIED 10/20/2007   HYPERTENSION 02/04/2008   Irritable bowel syndrome (IBS)    NEPHROLITHIASIS, HX OF 11/15/2008   OSTEOPOROSIS 01/01/2008   Other acquired absence of organ    parathyroidectomy   Pain in limb    PALPITATIONS, OCCASIONAL 07/12/2008   PARATHYROIDECTOMY 01/02/2009   PONV (postoperative  nausea and vomiting)    SCOLIOSIS 05/25/2009   Seasonal allergies    Unspecified constipation    URINARY INCONTINENCE 07/07/2007   UTI'S, RECURRENT 11/20/2009   kimbrough     Past Surgical History:  Procedure Laterality Date   BUNIONECTOMY     CATARACT EXTRACTION, BILATERAL     COLONOSCOPY     EXTRACORPOREAL SHOCK WAVE LITHOTRIPSY Left 12/21/2020   Procedure: EXTRACORPOREAL SHOCK WAVE LITHOTRIPSY (ESWL);  Surgeon: Jerilee Field, MD;  Location: [facility];  Service: Urology;  Laterality: Left;   LAPAROSCOPIC APPENDECTOMY  01/19/2016   Procedure: APPENDECTOMY LAPAROSCOPIC with orifice of cecal polyp;  Surgeon: Romie Levee, MD;  Location: [facility];  Service: General;;   LEFT HEART CATH AND CORONARY ANGIOGRAPHY N/A 04/07/2017   Procedure: Left Heart Cath and Coronary Angiography;  Surgeon: [name], MD;  Location: [facility];  Service: Cardiovascular;  Laterality: N/A;   PARATHYROIDECTOMY     Rt Superior open neck exploration   POLYPECTOMY     RIGHT OOPHORECTOMY     TONSILLECTOMY      Current Medications: Current Outpatient Medications on File Prior to Visit  Medication Sig   aspirin EC 81 MG tablet Take 1 tablet (81 mg total) by mouth daily.   b complex vitamins capsule Take 1 capsule by mouth daily.   Cholecalciferol (VITAMIN D) 50 MCG (2000 UT) CAPS Take 1 capsule by mouth daily.   denosumab (PROLIA) 60 MG/ML SOSY injection Inject 60 mg into the skin every 6 (six) months.   Ibuprofen (ADVIL) 200 MG CAPS Take 1 capsule by mouth daily.   LORazepam (ATIVAN) 0.5 MG tablet Take 1 tablet (0.5 mg total) by mouth 2 (two) times daily as needed for anxiety.   losartan (COZAAR) 50 MG tablet Take 0.5 tablets (25 mg total) by mouth in the morning and at bedtime.   magnesium oxide (MAG-OX) 400 (240 Mg) MG tablet Take 400 mg by mouth at bedtime.   No current facility-administered medications on file prior to visit.     Allergies:   Anesthetics, amide and Lisinopril    Social History   Tobacco Use   Smoking status: Never   Smokeless tobacco: Never  Vaping Use   Vaping Use: Never used  Substance Use Topics   Alcohol use: Yes    Comment: 1 glass of wine per day   Drug use: No    Family History: The patient's family history includes Breast cancer in her paternal aunt; Diabetes in an other family member; Heart disease in her mother; Heart disease (age of onset: 76) in her father; Sudden death (age of onset: 14) in her father. There is no history of Colon cancer, Stomach cancer, Esophageal cancer, or Pancreatic cancer.  ROS:   Please see the history of present illness.   (+) Nausea (+) Tremors (+) Cold sweats (+) Vision disturbance (+) Imbalance (+) Mechanical falls (+) Left LE edema (+) Fatigue (+) Daytime somnolence Additional pertinent ROS otherwise unremarkable.  EKGs/Labs/Other Studies Reviewed:    The following studies were reviewed today:  Echo 04/17/2021:  1. Left ventricular ejection fraction, by estimation, is 60 to 65%. The  left ventricle has normal function. The left ventricle has no regional  wall motion abnormalities. There is severe asymmetric left ventricular  hypertrophy of the basal-septal  segment. Left ventricular diastolic parameters are consistent with Grade I  diastolic dysfunction (impaired relaxation).   2. Right ventricular systolic function is normal. The right ventricular  size is normal. There is normal pulmonary artery systolic pressure. The  estimated right ventricular systolic pressure is 32.4 mmHg.   3. Left atrial size was mildly dilated.   4. The mitral valve is abnormal. Trivial mitral valve regurgitation.   5. The aortic valve is tricuspid. Aortic valve regurgitation is mild.  Mild to moderate aortic valve sclerosis/calcification is present, without  any evidence of aortic stenosis. Aortic regurgitation PHT measures 517  msec.   6. The inferior vena cava is dilated in size with >50% respiratory   variability, suggesting right atrial pressure of 8 mmHg.   Comparison(s): Changes from prior study are noted. 01/22/2016: LVEF 55-60%,  mild LAE, RVSP 49 mmHg.   LHC (04/07/17): Minimal CAD involving large epicardial coronary arteries. Mild to moderate stenosis of diagonal branches is evident. Normal LV contraction. Normal LV filling pressure. Small right radial artery.   Pharmacologic myocardial perfusion stress test (03/24/17): High risk study with small in size, mild in severity, basal inferoseptal and mid inferoseptal defects that could reflect diaphragmatic attenuation and/or ischemia. Is also a small in size, moderate in severity, partially reversible apical defect. Moderate in size, moderate in severity, basal anteroseptal, mid anteroseptal, and apical septal defect is reversible and concerning for ischemia. LVEF 67%.   Holter monitor (02/18/17): Predominantly sinus rhythm with rare PACs and PVCs. No significant arrhythmias.  EKG:  EKG is personally reviewed.   04/15/2023:  sinus bradycardia at 58 bpm 09/16/22: not ordered  08/28/2021: not ordered 04/09/21: sinus rhythm at 68 bpm  Recent Labs: 04/19/2022: Hemoglobin 13.8; Magnesium 2.2; Platelets 172 03/18/2023: ALT 12; BUN 18; Creatinine, Ser 0.81; Potassium 4.1; Sodium 142; TSH 1.20   Recent Lipid Panel    Component Value Date/Time   CHOL 132 01/25/2015 1112   TRIG 64 01/25/2015 1112   HDL 68 01/25/2015 1112   CHOLHDL 1.9 01/25/2015 1112   VLDL 13 01/25/2015 1112   LDLCALC 51 01/25/2015 1112   LDLDIRECT 156.3 03/28/2011 1038    Physical Exam:    VS:  BP (!) 192/88 (BP Location: Right Arm, Patient Position: Sitting, Cuff Size: Normal)   Pulse (!) 58   Ht 4\' 10"  (1.473 m)   Wt 115 lb 8 oz (52.4 kg)   BMI 24.14 kg/m     Wt Readings from Last 3 Encounters:  04/15/23 115 lb 8 oz (52.4 kg)  03/31/23 114 lb (51.7 kg)  03/18/23 116 lb (52.6 kg)    GEN: Well nourished, well developed in no acute distress HEENT: Normal, moist  mucous membranes NECK: No JVD CARDIAC: regular rhythm, normal S1 and S2, no rubs or gallops. No murmur. VASCULAR: Radial and DP pulses 2+ bilaterally. No carotid bruits RESPIRATORY:  Clear to auscultation without rales, wheezing or rhonchi  ABDOMEN: Soft, non-tender, non-distended MUSCULOSKELETAL:  Ambulates independently SKIN: Warm and dry, trivial nonpitting ankle edema, mostly prominent l ankle burse NEUROLOGIC:  Alert and oriented x 3. No focal neuro deficits noted. PSYCHIATRIC:  Normal affect    ASSESSMENT:  1. Labile blood pressure   2. Essential hypertension   3. Nonocclusive coronary atherosclerosis of native coronary artery   4. Hyperlipidemia LDL goal <70     PLAN:    nonobstructive CAD Hypercholesterolemia, LDL goal <70 -on cath 2018 -continue aspirin, atorvastatin -counseled on red flag warning signs that need immediate medical attention  Hypertension: labile -on losartan 50 mg in the AM and 25 mg in the PM -tolerating amlodipine 2.5 mg at bedtime -discussed intensifying today based on blood pressure, but she states that she feels the best when her BP is in the 140s. She would like to keep this as her BP goal -Discussed the following: Try taking 25 mg losartan at lunchtime (not first food of the day) and in the evening. See if that makes you feel better. If that doesn't improve your mid morning symptoms, then we may try mid day losartan and PM amlodipine (low dose). -limited options for additional meds. No beta blocker due to history of bradycardia. With intermittent low BP, would like to avoid thiazide/loop/MRA to avoid dehydration/orthostasis. Also has history of kidney stones.  Cardiac risk counseling and prevention recommendations: -recommend heart healthy/Mediterranean diet, with whole grains, fruits, vegetable, fish, lean meats, nuts, and olive oil. Limit salt. -recommend moderate walking, 3-5 times/week for 30-50 minutes each session. Aim for at least 150  minutes.week. Goal should be pace of 3 miles/hours, or walking 1.5 miles in 30 minutes -recommend avoidance of tobacco products. Avoid excess alcohol. -on aspirin 81 mg daily per personal preference  Plan for follow up: 3 weeks with APP, or sooner as needed.  Patient Instructions  Try taking 25 mg losartan at lunchtime (not first food of the day) and in the evening. See if that makes you feel better. If that doesn't improve your mid morning symptoms, then we may try mid day losartan and PM amlodipine (low dose).  Follow up with Ronn Melena NP in 3 weeks     I,Mathew Stumpf,acting as a scribe for [employer], MD.,have documented all relevant documentation on the behalf of Jodelle Red, MD,as directed by  Jodelle Red, MD while in the presence of Jodelle Red, MD.   I, Jodelle Red, MD, have reviewed all documentation for this visit. The documentation on 04/15/23 for the exam, diagnosis, procedures, and orders are all accurate and complete.   Signed, Jodelle Red, MD PhD 04/15/2023    [facility]

## 2023-04-21 DIAGNOSIS — M810 Age-related osteoporosis without current pathological fracture: Secondary | ICD-10-CM | POA: Diagnosis not present

## 2023-04-23 ENCOUNTER — Ambulatory Visit: Payer: Medicare Other | Attending: Gastroenterology | Admitting: Physical Therapy

## 2023-04-23 ENCOUNTER — Encounter: Payer: Self-pay | Admitting: Internal Medicine

## 2023-04-23 ENCOUNTER — Ambulatory Visit (INDEPENDENT_AMBULATORY_CARE_PROVIDER_SITE_OTHER): Payer: Medicare Other | Admitting: Internal Medicine

## 2023-04-23 VITALS — BP 160/100 | HR 60 | Temp 98.3°F | Ht <= 58 in | Wt 115.0 lb

## 2023-04-23 DIAGNOSIS — I951 Orthostatic hypotension: Secondary | ICD-10-CM

## 2023-04-23 DIAGNOSIS — R293 Abnormal posture: Secondary | ICD-10-CM | POA: Insufficient documentation

## 2023-04-23 DIAGNOSIS — R6883 Chills (without fever): Secondary | ICD-10-CM | POA: Diagnosis not present

## 2023-04-23 DIAGNOSIS — M6281 Muscle weakness (generalized): Secondary | ICD-10-CM | POA: Diagnosis not present

## 2023-04-23 DIAGNOSIS — K5901 Slow transit constipation: Secondary | ICD-10-CM | POA: Diagnosis not present

## 2023-04-23 DIAGNOSIS — R279 Unspecified lack of coordination: Secondary | ICD-10-CM | POA: Insufficient documentation

## 2023-04-23 DIAGNOSIS — I10 Essential (primary) hypertension: Secondary | ICD-10-CM

## 2023-04-23 DIAGNOSIS — F411 Generalized anxiety disorder: Secondary | ICD-10-CM | POA: Diagnosis not present

## 2023-04-23 NOTE — Assessment & Plan Note (Signed)
D/c Losartan Hydrate well May need to d/c Norvasc Use rollator walker  

## 2023-04-23 NOTE — Patient Instructions (Signed)
Try Dulcolax, Metamucil, laxative tea with MIRALAX to have a BM more often

## 2023-04-23 NOTE — Assessment & Plan Note (Signed)
Potential benefits of a long term benzodiazepines  use as well as potential risks  and complications were explained to the patient and were aknowledged. On Lorazepam low dose - better

## 2023-04-23 NOTE — Assessment & Plan Note (Addendum)
Miralax daily Patient is feeling better.  She thinks that her symptoms (weakness, dizziness, sweating) are related to her chronic constipation.  In the past she may have had a bowel movement once every 7 days.  Now, on MiraLAX she is having a bowel movement every 2-3 days. Try Dulcolax, Metamucil, laxative tea with MIRALAX to have a BM more often

## 2023-04-23 NOTE — Progress Notes (Signed)
Subjective:  Patient ID: [name], female    DOB: 1941-04-20  Age: 82 y.o. MRN: [id number]  CC: Follow-up (3 mnth f/u )   HPI [name] presents for anxiety, constipation, HTN Patient is feeling better.  She thinks that her symptoms (weakness, dizziness, sweating) are related to her chronic constipation.  In the past she may have had a bowel movement once every 7 days.  Now, on MiraLAX she is having a bowel movement every 2-3 days.  Outpatient Medications Prior to Visit  Medication Sig Dispense Refill   aspirin EC 81 MG tablet Take 1 tablet (81 mg total) by mouth daily. 100 tablet 3   b complex vitamins capsule Take 1 capsule by mouth daily.     Cholecalciferol (VITAMIN D) 50 MCG (2000 UT) CAPS Take 1 capsule by mouth daily.     denosumab (PROLIA) 60 MG/ML SOSY injection Inject 60 mg into the skin every 6 (six) months.     Ibuprofen (ADVIL) 200 MG CAPS Take 1 capsule by mouth daily.     LORazepam (ATIVAN) 0.5 MG tablet Take 1 tablet (0.5 mg total) by mouth 2 (two) times daily as needed for anxiety. 30 tablet 3   losartan (COZAAR) 50 MG tablet Take 0.5 tablets (25 mg total) by mouth in the morning and at bedtime. 30 tablet 11   magnesium oxide (MAG-OX) 400 (240 Mg) MG tablet Take 400 mg by mouth at bedtime.     No facility-administered medications prior to visit.    ROS: Review of Systems  Constitutional:  Negative for activity change, appetite change, chills, fatigue and unexpected weight change.  HENT:  Negative for congestion, mouth sores and sinus pressure.   Eyes:  Negative for visual disturbance.  Respiratory:  Negative for cough and chest tightness.   Gastrointestinal:  Negative for abdominal pain and nausea.  Genitourinary:  Negative for difficulty urinating, frequency and vaginal pain.  Musculoskeletal:  Positive for arthralgias and gait problem. Negative for back pain.  Skin:  Negative for pallor and rash.  Neurological:  Negative for dizziness, tremors,  weakness, numbness and headaches.  Psychiatric/Behavioral:  Negative for confusion, sleep disturbance and suicidal ideas.     Objective:  BP (!) 160/100 (BP Location: Left Arm, Patient Position: Sitting, Cuff Size: Normal)   Pulse 60   Temp 98.3 F (36.8 C) (Oral)   Ht 4\' 10"  (1.473 m)   Wt 115 lb (52.2 kg)   SpO2 98%   BMI 24.04 kg/m   BP Readings from Last 3 Encounters:  04/23/23 (!) 160/100  04/15/23 (!) 192/88  03/31/23 (!) 158/82    Wt Readings from Last 3 Encounters:  04/23/23 115 lb (52.2 kg)  04/15/23 115 lb 8 oz (52.4 kg)  03/31/23 114 lb (51.7 kg)    Physical Exam Constitutional:      General: She is not in acute distress.    Appearance: Normal appearance. She is well-developed.  HENT:     Head: Normocephalic.     Right Ear: External ear normal.     Left Ear: External ear normal.     Nose: Nose normal.  Eyes:     General:        Right eye: No discharge.        Left eye: No discharge.     Conjunctiva/sclera: Conjunctivae normal.     Pupils: Pupils are equal, round, and reactive to light.  Neck:     Thyroid: No thyromegaly.     Vascular:  No JVD.     Trachea: No tracheal deviation.  Cardiovascular:     Rate and Rhythm: Normal rate and regular rhythm.     Heart sounds: Normal heart sounds.  Pulmonary:     Effort: No respiratory distress.     Breath sounds: No stridor. No wheezing.  Abdominal:     General: Bowel sounds are normal. There is no distension.     Palpations: Abdomen is soft. There is no mass.     Tenderness: There is no abdominal tenderness. There is no guarding or rebound.  Musculoskeletal:        General: No tenderness.     Cervical back: Normal range of motion and neck supple. No rigidity.  Lymphadenopathy:     Cervical: No cervical adenopathy.  Skin:    Findings: No erythema or rash.  Neurological:     Cranial Nerves: No cranial nerve deficit.     Motor: No abnormal muscle tone.     Coordination: Coordination normal.     Deep  Tendon Reflexes: Reflexes normal.  Psychiatric:        Behavior: Behavior normal.        Thought Content: Thought content normal.        Judgment: Judgment normal.     Lab Results  Component Value Date   WBC 6.3 04/19/2022   HGB 13.8 04/19/2022   HCT 40.6 04/19/2022   PLT 172 04/19/2022   GLUCOSE 103 (H) 03/18/2023   CHOL 132 01/25/2015   TRIG 64 01/25/2015   HDL 68 01/25/2015   LDLDIRECT 156.3 03/28/2011   LDLCALC 51 01/25/2015   ALT 12 03/18/2023   AST 19 03/18/2023   NA 142 03/18/2023   K 4.1 03/18/2023   CL 108 03/18/2023   CREATININE 0.81 03/18/2023   BUN 18 03/18/2023   CO2 26 03/18/2023   TSH 1.20 03/18/2023   INR 1.0 04/01/2017    CT HEAD WO CONTRAST ( )  Result Date: 03/19/2023 CLINICAL DATA:  Ataxia, head trauma Ataxia, chronic, progressive (Ped 0-17y) R weakness x 3 wks, fell once. EXAM: CT HEAD WITHOUT CONTRAST TECHNIQUE: Contiguous axial images were obtained from the base of the skull through the vertex without intravenous contrast. RADIATION DOSE REDUCTION: This exam was performed according to the departmental dose-optimization program which includes automated exposure control, adjustment of the mA and/or kV according to patient size and/or use of iterative reconstruction technique. COMPARISON:  12/15/2020 FINDINGS: Brain: No acute intracranial abnormality. Specifically, no hemorrhage, hydrocephalus, mass lesion, acute infarction, or significant intracranial injury. Mild age related volume loss. Vascular: No hyperdense vessel or unexpected calcification. Skull: No acute calvarial abnormality. Sinuses/Orbits: No acute findings Other: None IMPRESSION: No acute intracranial abnormality. Electronically Signed   By: Charlett Nose M.D.   On: 03/19/2023 16:23    Assessment & Plan:   Problem List Items Addressed This Visit     Essential hypertension (Chronic)    D/c Losartan Hydrate well May need to d/c Norvasc Use rollator walker      Constipation    Miralax  daily Patient is feeling better.  She thinks that her symptoms (weakness, dizziness, sweating) are related to her chronic constipation.  In the past she may have had a bowel movement once every 7 days.  Now, on MiraLAX she is having a bowel movement every 2-3 days. Try Dulcolax, Metamucil, laxative tea with MIRALAX to have a BM more often      Orthostatic hypotension - Primary    Better Treat constipation  Anxiety disorder     Potential benefits of a long term benzodiazepines  use as well as potential risks  and complications were explained to the patient and were aknowledged. On Lorazepam low dose - better      Chills    Patient is feeling better.  She thinks that her symptoms (weakness, dizziness, sweating) are related to her chronic constipation.  In the past she may have had a bowel movement once every 7 days.  Now, on MiraLAX she is having a bowel movement every 2-3 days. Try Dulcolax, Metamucil, laxative tea with MIRALAX to have a BM more often         No orders of the defined types were placed in this encounter.     Follow-up: Return in about 3 months (around 07/24/2023) for a follow-up visit.  Sonda Primes, MD

## 2023-04-23 NOTE — Assessment & Plan Note (Signed)
Better Treat constipation

## 2023-04-23 NOTE — Assessment & Plan Note (Signed)
Patient is feeling better.  She thinks that her symptoms (weakness, dizziness, sweating) are related to her chronic constipation.  In the past she may have had a bowel movement once every 7 days.  Now, on MiraLAX she is having a bowel movement every 2-3 days. Try Dulcolax, Metamucil, laxative tea with MIRALAX to have a BM more often

## 2023-04-23 NOTE — Therapy (Signed)
OUTPATIENT PHYSICAL THERAPY FEMALE PELVIC EVALUATION   Patient Name: [name] MRN: [id number] DOB:08-22-1941, 82 y.o., female Today's Date: 04/23/2023  END OF SESSION:  PT End of Session - 04/23/23 1457     Visit Number 3    Date for PT Re-Evaluation 06/11/23    Authorization Type Medicare    Progress Note Due on Visit 10    PT Start Time 1455   arrival time   PT Stop Time 1530    PT Time Calculation (min) 35 min    Activity Tolerance Patient tolerated treatment well    Behavior During Therapy [facility] for tasks assessed/performed             Past Medical History:  Diagnosis Date   Abdominal pain, unspecified site    ADVERSE REACTION TO MEDICATION 07/31/2009   ANEMIA-NOS 07/07/2007   Arthritis    Chest pain, unspecified    Chronic kidney disease    hx of kidney stone   COLONIC POLYPS, ADENOMATOUS, HX OF    Complication of anesthesia    Disturbance of skin sensation    DIVERTICULOSIS, COLON 02/23/2009   Family history of diabetes mellitus    Family history of malignant neoplasm of breast    FATIGUE 05/25/2009   Fever, unspecified    Heart murmur    hx of   HYPERLIPIDEMIA 07/07/2007   HYPERPARATHYROIDISM UNSPECIFIED 10/20/2007   HYPERTENSION 02/04/2008   Irritable bowel syndrome (IBS)    NEPHROLITHIASIS, HX OF 11/15/2008   OSTEOPOROSIS 01/01/2008   Other acquired absence of organ    parathyroidectomy   Pain in limb    PALPITATIONS, OCCASIONAL 07/12/2008   PARATHYROIDECTOMY 01/02/2009   PONV (postoperative nausea and vomiting)    SCOLIOSIS 05/25/2009   Seasonal allergies    Unspecified constipation    URINARY INCONTINENCE 07/07/2007   UTI'S, RECURRENT 11/20/2009   kimbrough    Past Surgical History:  Procedure Laterality Date   BUNIONECTOMY     CATARACT EXTRACTION, BILATERAL     COLONOSCOPY     EXTRACORPOREAL SHOCK WAVE LITHOTRIPSY Left 12/21/2020   Procedure: EXTRACORPOREAL SHOCK WAVE LITHOTRIPSY (ESWL);  Surgeon: Jerilee Field, MD;  Location: [facility];  Service: Urology;  Laterality: Left;   LAPAROSCOPIC APPENDECTOMY  01/19/2016   Procedure: APPENDECTOMY LAPAROSCOPIC with orifice of cecal polyp;  Surgeon: Romie Levee, MD;  Location: [facility];  Service: General;;   LEFT HEART CATH AND CORONARY ANGIOGRAPHY N/A 04/07/2017   Procedure: Left Heart Cath and Coronary Angiography;  Surgeon: [name], MD;  Location: [facility];  Service: Cardiovascular;  Laterality: N/A;   PARATHYROIDECTOMY     Rt Superior open neck exploration   POLYPECTOMY     RIGHT OOPHORECTOMY     TONSILLECTOMY     Patient Active Problem List   Diagnosis Date Noted   Chills 03/31/2023   Lumbar radiculopathy 03/18/2023   Lumbar spondylosis 03/18/2023   Scoliosis deformity of spine 03/18/2023   Right sided weakness 03/18/2023   Somnolence, daytime 03/18/2023   Chronic bilateral low back pain without sciatica [M54.50, G89.29] 01/08/2023   Edema 01/08/2023   Ankle pain, left 11/22/2022   Other specified abnormal immunological findings in serum 04/03/2022   Pathological fracture of vertebra 04/03/2022   URI (upper respiratory infection) 04/03/2022   Allergic rhinitis 04/03/2022   Cough 04/03/2022   Anxiety disorder 06/12/2021   Unsteady gait when walking 01/19/2021   Diplopia 01/19/2021   Late onset Alzheimer's dementia without behavioral disturbance (HCC) 01/19/2021  Chronic idiopathic constipation 01/19/2021   Labile blood pressure 01/19/2021   Acute parotitis 03/09/2019   Coronary artery disease involving native coronary artery of native heart without angina pectoris 04/28/2017   Syncope 04/07/2017   Shortness of breath 04/07/2017   Abnormal stress test 04/07/2017   Sinus bradycardia 02/21/2017   Coronary artery calcification 02/21/2017   Compression fracture of body of thoracic vertebra (HCC) 02/08/2017   Dizziness and giddiness 02/08/2017   Atrial fibrillation with rapid ventricular response (HCC) 01/21/2016   Colon polyp s/p  appy/partialcecetomy 01/19/3016 01/19/2016   Orthostatic dizziness 04/25/2014   Vertigo 12/26/2012   Hypertension, uncontrolled 09/11/2012   Diastolic dysfunction 09/11/2012   Sleep disturbance 09/17/2011   Hypotension 12/26/2010   Orthostatic hypotension 12/26/2010   UTI'S, RECURRENT 11/20/2009   ADVERSE REACTION TO MEDICATION 07/31/2009   SCOLIOSIS 05/25/2009   FATIGUE 05/25/2009   DIVERTICULOSIS, COLON 02/23/2009   COLONIC POLYPS, ADENOMATOUS, HX OF 02/23/2009   PARATHYROIDECTOMY 01/02/2009   NEPHROLITHIASIS, HX OF 11/15/2008   PALPITATIONS, OCCASIONAL 07/12/2008   Essential hypertension 02/04/2008   Constipation 01/01/2008   LEG PAIN, LEFT 01/01/2008   Osteoporosis 01/01/2008   Thoracic back pain 12/04/2007   HYPERPARATHYROIDISM UNSPECIFIED 10/20/2007   HYPERLIPIDEMIA 07/07/2007   ANEMIA-NOS 07/07/2007   URINARY INCONTINENCE 07/07/2007    PCP: Tresa Garter, MD  REFERRING PROVIDER: Napoleon Form, MD  REFERRING DIAG: [phone] (ICD-10-CM) - Dyssynergic defecation  THERAPY DIAG:  Muscle weakness (generalized)  Abnormal posture  Unspecified lack of coordination  Rationale for Evaluation and Treatment: Rehabilitation  ONSET DATE: years  SUBJECTIVE:                                                                                                                                                                                           SUBJECTIVE STATEMENT: Pt reports she has been having a bowel movement around 3x per week still, no straining and type 4 continues and has only had one instance of bowel leakage very minimal amount however was getting to bathroom with this and then had a large bowel movement. Doesn't feel like she empty after stools. Urge drill has been having much less leakage and has been taking mybetriq   Does report she has been having feeling sick after eating first meal only - nauseous, lightheaded, cold sweat, fatigued but not after second or  third meal goes away after 2 hours.     Fluid intake: Yes: water - 30oz usually a day    PAIN:  Are you having pain? No  PRECAUTIONS: None  WEIGHT BEARING RESTRICTIONS: No  FALLS:  Has patient fallen in last 6 months? Yes. Number  of falls a few days ago, fell on a step - reports difficulties with Rt leg sometimes  LIVING ENVIRONMENT: Lives with: alone - in I living  Lives in: House/apartment   OCCUPATION: retired   PLOF: Independent  PATIENT GOALS: to have less leakage  PERTINENT HISTORY:  history of large tubulovillous adenoma in cecum status post endoscopic removal, was extending into appendiceal orifice with incomplete removal status post extended appendectomy and partial cecotomy March 2017. Chronic irritable bowel syndrome with alternating constipation and diarrhea. severe sigmoid diverticulosis and internal hemorrhoids.  Sexual abuse: No  BOWEL MOVEMENT: Pain with bowel movement: No Type of bowel movement:Type (Bristol Stool Scale) 1-7, Frequency sometimes goes a week without having bowel movement then goes for a few days, and Strain No Fully empty rectum: Yes:   Leakage: Yes:   Pads: Yes: small pad for urinary leakage and wears a depend when she is going a lot Fiber supplement: No but does take mirilax and prunes   URINATION: Pain with urination: No Fully empty bladder: Yes:   Stream: Strong and Weak Urgency: Yes:   Frequency: right at 2 hours Leakage: Urge to void, Coughing, Sneezing, Laughing, Exercise, and Bending forward Pads: Yes:    INTERCOURSE: Pain with intercourse:  not active   PREGNANCY: Vaginal deliveries 3 Tearing Yes: thinks so with all three C-section deliveries 0 Currently pregnant No  PROLAPSE: None   OBJECTIVE:   DIAGNOSTIC FINDINGS:     COGNITION: Overall cognitive status: Within functional limits for tasks assessed     SENSATION: Light touch: Appears intact Proprioception: Appears intact  MUSCLE LENGTH: Bil  hamstrings and adductors limited by 50%   GAIT: Distance walked: 200' Assistive device utilized: None Level of assistance: Complete Independence Comments: decreased cadence, decreased stride length, decreased Rt step height  POSTURE: rounded shoulders, forward head, increased thoracic kyphosis, posterior pelvic tilt, and right lateral lean  PELVIC ALIGNMENT: Rt pelvic obliquity   LUMBARAROM/PROM:  A/PROM A/PROM  eval  Flexion Decreased by 75%  Extension Decreased by 50%  Right lateral flexion Decreased by 75%  Left lateral flexion Decreased by 75%  Right rotation Decreased by 75%  Left rotation Decreased by 75%   (Blank rows = not tested)  LOWER EXTREMITY ROM:  WFL  LOWER EXTREMITY MMT:  Rt hip grossly 3+/5 except flexion 3/5; Lt hip grossly 3+/5, knees 4/5 PALPATION:   General  no TTP but does have noted fascial restrictions in all quadrants of abdomen                External Perineal Exam pt deferred                             Internal Pelvic Floor pt deferred  Patient confirms identification and approves PT to assess internal pelvic floor and treatment No  PELVIC MMT:   MMT eval  Vaginal   Internal Anal Sphincter   External Anal Sphincter   Puborectalis   Diastasis Recti   (Blank rows = not tested)        TONE: Pt deferred  PROLAPSE: Pt deferred  TODAY'S TREATMENT:  DATE:   03/12/2023 EVAL Examination completed, findings reviewed, pt educated on POC, abdominal massage and voiding mechanics. Pt motivated to participate in PT and agreeable to attempt recommendations.    03/26/23  Pt educated urge drill and given handout. Completed in sitting x2 HEP given AENJNADL - reviewed with pt and pt completed x2 reps of all exercises to improve carry over.  X5 pelvic floor contractions in sitting and x5 in supine - pt reports she feels them  more in sitting, x5 with towel roll in sitting for improved awareness of contraction  04/23/23 Pt educated on voiding mechanics again as she reported she has been unable to attempt these recommendations and has not been doing abdominal massage. All reviewed  Pt also had questions about MD's recommendation from Dr. Lavon Paganini, per note 02/26/23 - read this to pt. Pt denied additional questions reports she also has not been doing this only miralax     PATIENT EDUCATION:  Education details: abdominal massage and voiding mechanics  AENJNADL Person educated: Patient Education method: Explanation, Demonstration, Tactile cues, Verbal cues, and Handouts Education comprehension: verbalized understanding and returned demonstration  HOME EXERCISE PROGRAM: abdominal massage and voiding mechanics  ASSESSMENT:  CLINICAL IMPRESSION: Patient presents for treatment, reports continued progress as last session. But has not been doing abdominal massage, educated on this and voiding mechanics. Pt had questions about back discomfort and mobility at back and abdomen - plans for next session to do more therapeutic exercise based activity for improved core and back strength. Pt denied additional questions at end of session. Pt would benefit from additional PT to further address deficits.    OBJECTIVE IMPAIRMENTS: decreased coordination, decreased endurance, decreased mobility, difficulty walking, decreased strength, increased fascial restrictions, impaired flexibility, improper body mechanics, and postural dysfunction.   ACTIVITY LIMITATIONS: continence  PARTICIPATION LIMITATIONS: shopping and community activity  PERSONAL FACTORS: Time since onset of injury/illness/exacerbation are also affecting patient's functional outcome.   REHAB POTENTIAL: Good  CLINICAL DECISION MAKING: Stable/uncomplicated  EVALUATION COMPLEXITY: Low   GOALS: Goals reviewed with patient? Yes  SHORT TERM GOALS: Target date:  04/09/23  Pt to be I with HEP.  Baseline: Goal status: INITIAL  2.  Pt to be I with voiding and breathing mechanics and abdominal massage for improved bowel habits.  Baseline:  Goal status: INITIAL  3.  Pt to be I with urge drill and knack method to improve urinary leakage.  Baseline:  Goal status: INITIAL   LONG TERM GOALS: Target date: 06/11/23  Pt to be I with advanced HEP.  Baseline:  Goal status: INITIAL  2.  Pt to demonstrate at least 5/5 bil hip strength for improved pelvic stability and functional squats without leakage.  Baseline:  Goal status: INITIAL  3.  Pt to demonstrate improved coordination of pelvic floor and breathing mechanics with squat with appropriate synergistic patterns to decrease pain and leakage at least 75% of the time.    Baseline:  Goal status: INITIAL  4.  Pt will report 3 BMs per week due to improved muscle tone and coordination with BMs.  Baseline:  Goal status: INITIAL  5.  Pt will report her BMs are complete due to improved bowel habits and evacuation techniques.  Baseline:  Goal status: INITIAL  6.  Pt to demonstrate at least 3/5 pelvic floor strength and holds for at least 8s for improved pelvic stability and decreased strain at pelvic floor/ decrease leakage.  Baseline:  Goal status: INITIAL  PLAN:  PT FREQUENCY: every other  week  PT DURATION:  6 sessions  PLANNED INTERVENTIONS: Therapeutic exercises, Therapeutic activity, Neuromuscular re-education, Balance training, Patient/Family education, Self Care, Joint mobilization, Dry Needling, Electrical stimulation, Spinal mobilization, Cryotherapy, Moist heat, scar mobilization, Biofeedback, and Manual therapy  PLAN FOR NEXT SESSION: internal if needed and pt consents, breathing and voiding mechanics, urge drill, knack, coordination of pelvic floor and breathing/core and exercises  Otelia Sergeant, PT, DPT 06/05/244:09 PM

## 2023-05-05 ENCOUNTER — Ambulatory Visit (INDEPENDENT_AMBULATORY_CARE_PROVIDER_SITE_OTHER): Payer: Medicare Other | Admitting: Family

## 2023-05-05 ENCOUNTER — Encounter (HOSPITAL_BASED_OUTPATIENT_CLINIC_OR_DEPARTMENT_OTHER): Payer: Self-pay | Admitting: Family

## 2023-05-05 VITALS — BP 186/82 | HR 59 | Ht <= 58 in | Wt 115.2 lb

## 2023-05-05 DIAGNOSIS — R030 Elevated blood-pressure reading, without diagnosis of hypertension: Secondary | ICD-10-CM

## 2023-05-05 DIAGNOSIS — E785 Hyperlipidemia, unspecified: Secondary | ICD-10-CM | POA: Diagnosis not present

## 2023-05-05 DIAGNOSIS — R0989 Other specified symptoms and signs involving the circulatory and respiratory systems: Secondary | ICD-10-CM

## 2023-05-05 DIAGNOSIS — I25118 Atherosclerotic heart disease of native coronary artery with other forms of angina pectoris: Secondary | ICD-10-CM

## 2023-05-05 MED ORDER — OLMESARTAN MEDOXOMIL 20 MG PO TABS: 20.0000 mg | ORAL_TABLET | Freq: Every day | ORAL | 1 refills | Status: DC

## 2023-05-05 MED ORDER — OLMESARTAN MEDOXOMIL 20 MG PO TABS: 20.0000 mg | ORAL_TABLET | Freq: Every evening | ORAL | 1 refills | Status: DC

## 2023-05-05 NOTE — Patient Instructions (Signed)
Medication Instructions:  STOP LOSARTAN   START OLMESARTAN 20 MG EVERY EVENING  *If you need a refill on your cardiac medications before your next appointment, please call your pharmacy*  Lab Work: NONE  Testing/Procedures: 24 HOUR BLOOD PRESSURE MONITOR  THE OFFICE WILL CALL YOU TO SCHEDULE   Follow-Up: At [facility], you and your health needs are our priority.  As part of our continuing mission to provide you with exceptional heart care, we have created designated Provider Care Teams.  These Care Teams include your primary Cardiologist (physician) and Advanced Practice Providers (APPs -  Physician Assistants and Nurse Practitioners) who all work together to provide you with the care you need, when you need it.  We recommend signing up for the patient portal called "MyChart".  Sign up information is provided on this After Visit Summary.  MyChart is used to connect with patients for Virtual Visits (Telemedicine).  Patients are able to view lab/test results, encounter notes, upcoming appointments, etc.  Non-urgent messages can be sent to your provider as well.   To learn more about what you can do with MyChart, go to [email or web address].    Your next appointment:   3 week(s)  Provider:   Gillian Shields, NP

## 2023-05-05 NOTE — Progress Notes (Unsigned)
  Cardiology Office Note:  .   Date:  05/07/2023  ID:  [name], DOB 1941-11-17, MRN [id number] PCP: Teresa Garter, MD  [facility] Providers Cardiologist:  Teresa Red, MD    History of Present Illness: [name]   [name] is a 82 y.o. female  hx of HTN, CAD (nonobstructive by cath 2018), HLD, PAF during hospitalization 2017 last seen 04/15/23 by Dr. Cristal Stein.   Prior patient of Dr. Okey Stein.  Established with Dr. Cristal Stein 07/19/2019.  Longstanding history of labile blood pressure and orthostatic hypotension.  Previously has been told to continue losartan 25 mg twice daily and hold if BP less than 110.  Amlodipine also used previously.  At last visit 04/15/2023 she was recommended to try taking her initial dose of losartan at lunchtime to see if it helped with her lightheaded spells.  Presents today for follow-up independently. After she eats her blood pressure will drop from 185-190 to 117-123. Does not feel well when her blood pressure drops. Feels tired, shaky on the inside per her report. She is presently taking Losartan half tablet morning and evening. She has stopped Amlodipine. She is taking Miralax. Does have occasional accident twice per month with loose stool but no recent dehydration.   Sample of home BP readings: 100/70, 204/120, 158/73  ROS: EKG is not ordered today.   Studies Reviewed: [name]    EKG:  EKG not ordered today.    Risk Assessment/Calculations:            Physical Exam:   VS:  BP (!) 186/82   Pulse (!) 59   Ht 4\' 10"  (1.473 m)   Wt 115 lb 3.2 oz (52.3 kg)   SpO2 96%   BMI 24.08 kg/m    Wt Readings from Last 3 Encounters:  05/05/23 115 lb 3.2 oz (52.3 kg)  04/23/23 115 lb (52.2 kg)  04/15/23 115 lb 8 oz (52.4 kg)    GEN: Well nourished, thin, well developed in no acute distress NECK: No JVD; No carotid bruits CARDIAC: RRR, no murmurs, rubs, gallops RESPIRATORY:  Clear to auscultation without rales, wheezing or rhonchi   ABDOMEN: Soft, non-tender, non-distended EXTREMITIES:  No edema; No deformity   ASSESSMENT AND PLAN: .    Labile HTN / Orthostatic hypotension - Stop Losartan 25mg  BID. Start Olmesartan 20mg  at bedtime. Hopeful this will have better 24h control and prevent sudden BP drops she is experiencing after her first meal. Interestingly, no symptoms after evening meal. Recommend 24h BP monitor due to persistently labile BP with symptomatic hypotension.   CAD / HLD - Stable with no anginal symptoms. No indication for ischemic evaluation.  Minimal CAD by [facility] 2018. No BB due to bradycardia. Continue Aspirin. Consider lipid panel at follow up. Not addressed today.       Dispo: follow up in 3 weeks  Signed, Alver Sorrow, NP

## 2023-05-06 ENCOUNTER — Ambulatory Visit: Payer: Medicare Other | Admitting: Physical Therapy

## 2023-05-06 DIAGNOSIS — R293 Abnormal posture: Secondary | ICD-10-CM | POA: Diagnosis not present

## 2023-05-06 DIAGNOSIS — M6281 Muscle weakness (generalized): Secondary | ICD-10-CM | POA: Diagnosis not present

## 2023-05-06 DIAGNOSIS — R279 Unspecified lack of coordination: Secondary | ICD-10-CM | POA: Diagnosis not present

## 2023-05-06 NOTE — Therapy (Signed)
OUTPATIENT PHYSICAL THERAPY FEMALE PELVIC EVALUATION   Patient Name: [name] MRN: [id number] DOB:[birth year], 82 y.o., female Today's Date: 05/06/2023  END OF SESSION:  PT End of Session - 05/06/23 1448     Visit Number 4    Date for PT Re-Evaluation 06/11/23    Authorization Type Medicare    Progress Note Due on Visit 10    PT Start Time 1400    PT Stop Time 1443    PT Time Calculation (min) 43 min    Activity Tolerance Patient tolerated treatment well    Behavior During Therapy WFL for tasks assessed/performed              Past Medical History:  Diagnosis Date   Abdominal pain, unspecified site    ADVERSE REACTION TO MEDICATION 07/31/2009   ANEMIA-NOS 07/07/2007   Arthritis    Chest pain, unspecified    Chronic kidney disease    hx of kidney stone   COLONIC POLYPS, ADENOMATOUS, HX OF    Complication of anesthesia    Disturbance of skin sensation    DIVERTICULOSIS, COLON 02/23/2009   Family history of diabetes mellitus    Family history of malignant neoplasm of breast    FATIGUE 05/25/2009   Fever, unspecified    Heart murmur    hx of   HYPERLIPIDEMIA 07/07/2007   HYPERPARATHYROIDISM UNSPECIFIED 10/20/2007   HYPERTENSION 02/04/2008   Irritable bowel syndrome (IBS)    NEPHROLITHIASIS, HX OF 11/15/2008   OSTEOPOROSIS 01/01/2008   Other acquired absence of organ    parathyroidectomy   Pain in limb    PALPITATIONS, OCCASIONAL 07/12/2008   PARATHYROIDECTOMY 01/02/2009   PONV (postoperative nausea and vomiting)    SCOLIOSIS 05/25/2009   Seasonal allergies    Unspecified constipation    URINARY INCONTINENCE 07/07/2007   UTI'S, RECURRENT 11/20/2009   kimbrough    Past Surgical History:  Procedure Laterality Date   BUNIONECTOMY     CATARACT EXTRACTION, BILATERAL     COLONOSCOPY     EXTRACORPOREAL SHOCK WAVE LITHOTRIPSY Left 12/21/2020   Procedure: EXTRACORPOREAL SHOCK WAVE LITHOTRIPSY (ESWL);  Surgeon: Jerilee Field, MD;  Location: [facility];   Service: Urology;  Laterality: Left;   LAPAROSCOPIC APPENDECTOMY  01/19/2016   Procedure: APPENDECTOMY LAPAROSCOPIC with orifice of cecal polyp;  Surgeon: Romie Levee, MD;  Location: [facility];  Service: General;;   LEFT HEART CATH AND CORONARY ANGIOGRAPHY N/A 04/07/2017   Procedure: Left Heart Cath and Coronary Angiography;  Surgeon: [name], MD;  Location: [facility];  Service: Cardiovascular;  Laterality: N/A;   PARATHYROIDECTOMY     Rt Superior open neck exploration   POLYPECTOMY     RIGHT OOPHORECTOMY     TONSILLECTOMY     Patient Active Problem List   Diagnosis Date Noted   Chills 03/31/2023   Lumbar radiculopathy 03/18/2023   Lumbar spondylosis 03/18/2023   Scoliosis deformity of spine 03/18/2023   Right sided weakness 03/18/2023   Somnolence, daytime 03/18/2023   Chronic bilateral low back pain without sciatica [M54.50, G89.29] 01/08/2023   Edema 01/08/2023   Ankle pain, left 11/22/2022   Other specified abnormal immunological findings in serum 04/03/2022   Pathological fracture of vertebra 04/03/2022   URI (upper respiratory infection) 04/03/2022   Allergic rhinitis 04/03/2022   Cough 04/03/2022   Anxiety disorder 06/12/2021   Unsteady gait when walking 01/19/2021   Diplopia 01/19/2021   Late onset Alzheimer's dementia without behavioral disturbance (HCC) 01/19/2021   Chronic idiopathic  constipation 01/19/2021   Labile blood pressure 01/19/2021   Acute parotitis 03/09/2019   Coronary artery disease involving native coronary artery of native heart without angina pectoris 04/28/2017   Syncope 04/07/2017   Shortness of breath 04/07/2017   Abnormal stress test 04/07/2017   Sinus bradycardia 02/21/2017   Coronary artery calcification 02/21/2017   Compression fracture of body of thoracic vertebra (HCC) 02/08/2017   Dizziness and giddiness 02/08/2017   Atrial fibrillation with rapid ventricular response (HCC) 01/21/2016   Colon polyp s/p appy/partialcecetomy  01/19/3016 01/19/2016   Orthostatic dizziness 04/25/2014   Vertigo 12/26/2012   Hypertension, uncontrolled 09/11/2012   Diastolic dysfunction 09/11/2012   Sleep disturbance 09/17/2011   Hypotension 12/26/2010   Orthostatic hypotension 12/26/2010   UTI'S, RECURRENT 11/20/2009   ADVERSE REACTION TO MEDICATION 07/31/2009   SCOLIOSIS 05/25/2009   FATIGUE 05/25/2009   DIVERTICULOSIS, COLON 02/23/2009   COLONIC POLYPS, ADENOMATOUS, HX OF 02/23/2009   PARATHYROIDECTOMY 01/02/2009   NEPHROLITHIASIS, HX OF 11/15/2008   PALPITATIONS, OCCASIONAL 07/12/2008   Essential hypertension 02/04/2008   Constipation 01/01/2008   LEG PAIN, LEFT 01/01/2008   Osteoporosis 01/01/2008   Thoracic back pain 12/04/2007   HYPERPARATHYROIDISM UNSPECIFIED 10/20/2007   HYPERLIPIDEMIA 07/07/2007   ANEMIA-NOS 07/07/2007   URINARY INCONTINENCE 07/07/2007    PCP: Plotnikov, Georgina Quint, MD  REFERRING PROVIDER: Napoleon Form, MD  REFERRING DIAG: K22.02 (ICD-10-CM) - Dyssynergic defecation  THERAPY DIAG:  Muscle weakness (generalized)  Abnormal posture  Unspecified lack of coordination  Rationale for Evaluation and Treatment: Rehabilitation  ONSET DATE: years  SUBJECTIVE:                                                                                                                                                                                           SUBJECTIVE STATEMENT: Pt reports she has been emptying bowels a little every day, not fully. Only has fully emptying instance when she has accident. However attempted taking miralax half cap full daily for 4 days, and had the accident.  Pt reports she is still having instances of not feeling well after first meal of the day and plans to let doctor know about this. Is having 3 bowel movement weekly now but not fully emptying.   Fluid intake: Yes: water - 30oz usually a day    PAIN:  Are you having pain? No  PRECAUTIONS: None  WEIGHT BEARING  RESTRICTIONS: No  FALLS:  Has patient fallen in last 6 months? Yes. Number of falls a few days ago, fell on a step - reports difficulties with Rt leg sometimes  LIVING ENVIRONMENT: Lives with: alone - in I living  Lives  in: House/apartment   OCCUPATION: retired   PLOF: Independent  PATIENT GOALS: to have less leakage  PERTINENT HISTORY:  history of large tubulovillous adenoma in cecum status post endoscopic removal, was extending into appendiceal orifice with incomplete removal status post extended appendectomy and partial cecotomy March 2017. Chronic irritable bowel syndrome with alternating constipation and diarrhea. severe sigmoid diverticulosis and internal hemorrhoids.  Sexual abuse: No  BOWEL MOVEMENT: Pain with bowel movement: No Type of bowel movement:Type (Bristol Stool Scale) 1-7, Frequency sometimes goes a week without having bowel movement then goes for a few days, and Strain No Fully empty rectum: Yes:   Leakage: Yes:   Pads: Yes: small pad for urinary leakage and wears a depend when she is going a lot Fiber supplement: No but does take mirilax and prunes   URINATION: Pain with urination: No Fully empty bladder: Yes:   Stream: Strong and Weak Urgency: Yes:   Frequency: right at 2 hours Leakage: Urge to void, Coughing, Sneezing, Laughing, Exercise, and Bending forward Pads: Yes:    INTERCOURSE: Pain with intercourse:  not active   PREGNANCY: Vaginal deliveries 3 Tearing Yes: thinks so with all three C-section deliveries 0 Currently pregnant No  PROLAPSE: None   OBJECTIVE:   DIAGNOSTIC FINDINGS:     COGNITION: Overall cognitive status: Within functional limits for tasks assessed     SENSATION: Light touch: Appears intact Proprioception: Appears intact  MUSCLE LENGTH: Bil hamstrings and adductors limited by 50%   GAIT: Distance walked: 200' Assistive device utilized: None Level of assistance: Complete Independence Comments: decreased  cadence, decreased stride length, decreased Rt step height  POSTURE: rounded shoulders, forward head, increased thoracic kyphosis, posterior pelvic tilt, and right lateral lean  PELVIC ALIGNMENT: Rt pelvic obliquity   LUMBARAROM/PROM:  A/PROM A/PROM  eval  Flexion Decreased by 75%  Extension Decreased by 50%  Right lateral flexion Decreased by 75%  Left lateral flexion Decreased by 75%  Right rotation Decreased by 75%  Left rotation Decreased by 75%   (Blank rows = not tested)  LOWER EXTREMITY ROM:  WFL  LOWER EXTREMITY MMT:  Rt hip grossly 3+/5 except flexion 3/5; Lt hip grossly 3+/5, knees 4/5 PALPATION:   General  no TTP but does have noted fascial restrictions in all quadrants of abdomen                External Perineal Exam pt deferred                             Internal Pelvic Floor pt deferred  Patient confirms identification and approves PT to assess internal pelvic floor and treatment No  PELVIC MMT:   MMT eval  Vaginal   Internal Anal Sphincter   External Anal Sphincter   Puborectalis   Diastasis Recti   (Blank rows = not tested)        TONE: Pt deferred  PROLAPSE: Pt deferred  TODAY'S TREATMENT:  DATE:    04/23/23 Pt educated on voiding mechanics again as she reported she has been unable to attempt these recommendations and has not been doing abdominal massage. All reviewed  Pt also had questions about MD's recommendation from Dr. Lavon Paganini, per note 02/26/23 - read this to pt. Pt denied additional questions reports she also has not been doing this only miralax   05/06/23  Green band 2x10: bil horizontal abduction, rows, and diagonals 1# hand weights x10 punches, x10 arced punches HEP updated and reviewed with pt Manual - abdominal massage completed with pt in hooklying    PATIENT EDUCATION:  Education details:  abdominal massage and voiding mechanics  AENJNADL Person educated: Patient Education method: Explanation, Demonstration, Tactile cues, Verbal cues, and Handouts Education comprehension: verbalized understanding and returned demonstration  HOME EXERCISE PROGRAM: abdominal massage and voiding mechanics  ASSESSMENT:  CLINICAL IMPRESSION: Patient presents for treatment, reports continued progress as last session. Session focused on posture strengthening exercises to promote improved abdominal mobility and decreased tension at abdomen. HEP updated. Manual for abdominal massage at end of session for improved peristalsis.pt denied internal rectal assessment today as she wasn't feeling well.  Pt denied additional questions at end of session. Pt would benefit from additional PT to further address deficits.    OBJECTIVE IMPAIRMENTS: decreased coordination, decreased endurance, decreased mobility, difficulty walking, decreased strength, increased fascial restrictions, impaired flexibility, improper body mechanics, and postural dysfunction.   ACTIVITY LIMITATIONS: continence  PARTICIPATION LIMITATIONS: shopping and community activity  PERSONAL FACTORS: Time since onset of injury/illness/exacerbation are also affecting patient's functional outcome.   REHAB POTENTIAL: Good  CLINICAL DECISION MAKING: Stable/uncomplicated  EVALUATION COMPLEXITY: Low   GOALS: Goals reviewed with patient? Yes  SHORT TERM GOALS: Target date: 04/09/23  Pt to be I with HEP.  Baseline: Goal status: MET  2.  Pt to be I with voiding and breathing mechanics and abdominal massage for improved bowel habits.  Baseline:  Goal status: MET  3.  Pt to be I with urge drill and knack method to improve urinary leakage.  Baseline:  Goal status: MET   LONG TERM GOALS: Target date: 06/11/23  Pt to be I with advanced HEP.  Baseline:  Goal status: MET  2.  Pt to demonstrate at least 5/5 bil hip strength for improved pelvic  stability and functional squats without leakage.  Baseline:  Goal status: on going  3.  Pt to demonstrate improved coordination of pelvic floor and breathing mechanics with squat with appropriate synergistic patterns to decrease pain and leakage at least 75% of the time.    Baseline:  Goal status: on going  4.  Pt will report 3 BMs per week due to improved muscle tone and coordination with BMs.  Baseline:  Goal status: MET  5.  Pt will report her BMs are complete due to improved bowel habits and evacuation techniques.  Baseline:  Goal status: on going  6.  Pt to demonstrate at least 3/5 pelvic floor strength and holds for at least 8s for improved pelvic stability and decreased strain at pelvic floor/ decrease leakage.  Baseline:  Goal status: on going  PLAN:  PT FREQUENCY: every other week  PT DURATION:  6 sessions  PLANNED INTERVENTIONS: Therapeutic exercises, Therapeutic activity, Neuromuscular re-education, Balance training, Patient/Family education, Self Care, Joint mobilization, Dry Needling, Electrical stimulation, Spinal mobilization, Cryotherapy, Moist heat, scar mobilization, Biofeedback, and Manual therapy  PLAN FOR NEXT SESSION: internal if needed and pt consents, breathing and voiding mechanics, urge drill,  knack, coordination of pelvic floor and breathing/core and exercises  Otelia Sergeant, PT, DPT 06/18/242:49 PM

## 2023-05-15 ENCOUNTER — Telehealth (HOSPITAL_BASED_OUTPATIENT_CLINIC_OR_DEPARTMENT_OTHER): Payer: Self-pay | Admitting: Family

## 2023-05-15 NOTE — Telephone Encounter (Signed)
Called and spoke with patient Last blood pressure was about an hour ago Asked that she check blood pressure after sitting for 10 minutes and will call her to follow up

## 2023-05-15 NOTE — Telephone Encounter (Signed)
Call to patient. Rings  multiple times then  goes to busy signal. Unable to LM

## 2023-05-15 NOTE — Telephone Encounter (Signed)
Pt c/o medication issue:  1. Name of Medication: Olmesartan 20 mg  2. How are you currently taking this medication (dosage and times per day)? 1 time a day  3. Are you having a reaction (difficulty breathing--STAT)?   4. What is your medication issue? Medicine is not helping. Her blood pressure this morning was 225/107      Pt c/o of Chest Pain: STAT if CP now or developed within 24 hours  1. Are you having CP right now? Thumping in her chest at this time  2. Are you experiencing any other symptoms (ex. SOB, nausea, vomiting, sweating)? Shaking or shivering all over breath, short of breath yesterday  3. How long have you been experiencing CP?  A couple of days  4. Is your CP continuous or coming and going? Comes and goes  5. Have you taken Nitroglycerin? no ? i

## 2023-05-15 NOTE — Telephone Encounter (Signed)
Followed up with patient and blood pressure was 162/84, however PCP recommended taking 1/2 of Lorazepam  Yesterday blood pressure was 190's Only checked today and yesterday since medication change  Patient takes her Olmesartan 20 mg in the evening   Discussed with Dr Cristal Deer who recommended increasing to Olmesartan to 40 mg She can do either do Olmesartan 40 mg at bedtime or 20 mg twice a day (12 hours apart) Spoke with patient and she will try twice a day Advised to call back if any issues prior to follow up 7/8, if not bring blood pressure readings and machine to visit  Patient verbalized understanding

## 2023-05-20 ENCOUNTER — Ambulatory Visit: Payer: Medicare Other | Attending: Gastroenterology | Admitting: Physical Therapy

## 2023-05-20 DIAGNOSIS — R279 Unspecified lack of coordination: Secondary | ICD-10-CM | POA: Insufficient documentation

## 2023-05-20 DIAGNOSIS — R293 Abnormal posture: Secondary | ICD-10-CM | POA: Diagnosis not present

## 2023-05-20 DIAGNOSIS — M6281 Muscle weakness (generalized): Secondary | ICD-10-CM | POA: Diagnosis not present

## 2023-05-20 NOTE — Therapy (Signed)
OUTPATIENT PHYSICAL THERAPY FEMALE PELVIC EVALUATION   Patient Name: [name] MRN: [id number] DOB:[birth year], 82 y.o., female Today's Date: 05/20/2023  END OF SESSION:  PT End of Session - 05/20/23 1358     Visit Number 5    Date for PT Re-Evaluation 08/06/23    Authorization Type Medicare    Progress Note Due on Visit 10    PT Start Time 1400    PT Stop Time 1440    PT Time Calculation (min) 40 min    Activity Tolerance Patient tolerated treatment well    Behavior During Therapy [facility] for tasks assessed/performed              Past Medical History:  Diagnosis Date   Abdominal pain, unspecified site    ADVERSE REACTION TO MEDICATION 07/31/2009   ANEMIA-NOS 07/07/2007   Arthritis    Chest pain, unspecified    Chronic kidney disease    hx of kidney stone   COLONIC POLYPS, ADENOMATOUS, HX OF    Complication of anesthesia    Disturbance of skin sensation    DIVERTICULOSIS, COLON 02/23/2009   Family history of diabetes mellitus    Family history of malignant neoplasm of breast    FATIGUE 05/25/2009   Fever, unspecified    Heart murmur    hx of   HYPERLIPIDEMIA 07/07/2007   HYPERPARATHYROIDISM UNSPECIFIED 10/20/2007   HYPERTENSION 02/04/2008   Irritable bowel syndrome (IBS)    NEPHROLITHIASIS, HX OF 11/15/2008   OSTEOPOROSIS 01/01/2008   Other acquired absence of organ    parathyroidectomy   Pain in limb    PALPITATIONS, OCCASIONAL 07/12/2008   PARATHYROIDECTOMY 01/02/2009   PONV (postoperative nausea and vomiting)    SCOLIOSIS 05/25/2009   Seasonal allergies    Unspecified constipation    URINARY INCONTINENCE 07/07/2007   UTI'S, RECURRENT 11/20/2009   kimbrough    Past Surgical History:  Procedure Laterality Date   BUNIONECTOMY     CATARACT EXTRACTION, BILATERAL     COLONOSCOPY     EXTRACORPOREAL SHOCK WAVE LITHOTRIPSY Left 12/21/2020   Procedure: EXTRACORPOREAL SHOCK WAVE LITHOTRIPSY (ESWL);  Surgeon: Jerilee Field, MD;  Location: [facility];   Service: Urology;  Laterality: Left;   LAPAROSCOPIC APPENDECTOMY  01/19/2016   Procedure: APPENDECTOMY LAPAROSCOPIC with orifice of cecal polyp;  Surgeon: Romie Levee, MD;  Location: [facility];  Service: General;;   LEFT HEART CATH AND CORONARY ANGIOGRAPHY N/A 04/07/2017   Procedure: Left Heart Cath and Coronary Angiography;  Surgeon: [name], MD;  Location: [facility];  Service: Cardiovascular;  Laterality: N/A;   PARATHYROIDECTOMY     Rt Superior open neck exploration   POLYPECTOMY     RIGHT OOPHORECTOMY     TONSILLECTOMY     Patient Active Problem List   Diagnosis Date Noted   Chills 03/31/2023   Lumbar radiculopathy 03/18/2023   Lumbar spondylosis 03/18/2023   Scoliosis deformity of spine 03/18/2023   Right sided weakness 03/18/2023   Somnolence, daytime 03/18/2023   Chronic bilateral low back pain without sciatica [M54.50, G89.29] 01/08/2023   Edema 01/08/2023   Ankle pain, left 11/22/2022   Other specified abnormal immunological findings in serum 04/03/2022   Pathological fracture of vertebra 04/03/2022   URI (upper respiratory infection) 04/03/2022   Allergic rhinitis 04/03/2022   Cough 04/03/2022   Anxiety disorder 06/12/2021   Unsteady gait when walking 01/19/2021   Diplopia 01/19/2021   Late onset Alzheimer's dementia without behavioral disturbance (HCC) 01/19/2021   Chronic idiopathic  constipation 01/19/2021   Labile blood pressure 01/19/2021   Acute parotitis 03/09/2019   Coronary artery disease involving native coronary artery of native heart without angina pectoris 04/28/2017   Syncope 04/07/2017   Shortness of breath 04/07/2017   Abnormal stress test 04/07/2017   Sinus bradycardia 02/21/2017   Coronary artery calcification 02/21/2017   Compression fracture of body of thoracic vertebra (HCC) 02/08/2017   Dizziness and giddiness 02/08/2017   Atrial fibrillation with rapid ventricular response (HCC) 01/21/2016   Colon polyp s/p appy/partialcecetomy  01/19/3016 01/19/2016   Orthostatic dizziness 04/25/2014   Vertigo 12/26/2012   Hypertension, uncontrolled 09/11/2012   Diastolic dysfunction 09/11/2012   Sleep disturbance 09/17/2011   Hypotension 12/26/2010   Orthostatic hypotension 12/26/2010   UTI'S, RECURRENT 11/20/2009   ADVERSE REACTION TO MEDICATION 07/31/2009   SCOLIOSIS 05/25/2009   FATIGUE 05/25/2009   DIVERTICULOSIS, COLON 02/23/2009   COLONIC POLYPS, ADENOMATOUS, HX OF 02/23/2009   PARATHYROIDECTOMY 01/02/2009   NEPHROLITHIASIS, HX OF 11/15/2008   PALPITATIONS, OCCASIONAL 07/12/2008   Essential hypertension 02/04/2008   Constipation 01/01/2008   LEG PAIN, LEFT 01/01/2008   Osteoporosis 01/01/2008   Thoracic back pain 12/04/2007   HYPERPARATHYROIDISM UNSPECIFIED 10/20/2007   HYPERLIPIDEMIA 07/07/2007   ANEMIA-NOS 07/07/2007   URINARY INCONTINENCE 07/07/2007    PCP: Plotnikov, Georgina Quint, MD  REFERRING PROVIDER: Napoleon Form, MD  REFERRING DIAG: [phone] (ICD-10-CM) - Dyssynergic defecation  THERAPY DIAG:  Muscle weakness (generalized)  Abnormal posture  Unspecified lack of coordination  Rationale for Evaluation and Treatment: Rehabilitation  ONSET DATE: years  SUBJECTIVE:                                                                                                                                                                                           SUBJECTIVE STATEMENT: Pt reports she has been taking mirilax daily 1/2 cap full daily. Went 5 days without bowel movement has gone twice this week now but today has been bad. Pt states she has been several times, and has had a few small accidents with this. BP was up last week and was up to 170-180s when she woke up. Let MD know and they have addressed this. Has not called Dr. Lavon Paganini yet since improving bowel habits on average. But pt does report "I don't feel good today. My stomach is upset".   Is not leakage of urine now and able to tighten pelvic  floor and hold urine until in bathroom.   Fluid intake: Yes: water - 30oz usually a day    PAIN:  Are you having pain? No  PRECAUTIONS: None  WEIGHT BEARING RESTRICTIONS: No  FALLS:  Has patient fallen in last 6 months? Yes. Number of falls a few days ago, fell on a step - reports difficulties with Rt leg sometimes  LIVING ENVIRONMENT: Lives with: alone - in I living  Lives in: House/apartment   OCCUPATION: retired   PLOF: Independent  PATIENT GOALS: to have less leakage  PERTINENT HISTORY:  history of large tubulovillous adenoma in cecum status post endoscopic removal, was extending into appendiceal orifice with incomplete removal status post extended appendectomy and partial cecotomy March 2017. Chronic irritable bowel syndrome with alternating constipation and diarrhea. severe sigmoid diverticulosis and internal hemorrhoids.  Sexual abuse: No  BOWEL MOVEMENT: Pain with bowel movement: No Type of bowel movement:Type (Bristol Stool Scale) 1-7, Frequency sometimes goes a week without having bowel movement then goes for a few days, and Strain No Fully empty rectum: Yes:   Leakage: Yes:   Pads: Yes: small pad for urinary leakage and wears a depend when she is going a lot Fiber supplement: No but does take mirilax and prunes   URINATION: Pain with urination: No Fully empty bladder: Yes:   Stream: Strong and Weak Urgency: Yes:   Frequency: right at 2 hours Leakage: Urge to void, Coughing, Sneezing, Laughing, Exercise, and Bending forward Pads: Yes:    INTERCOURSE: Pain with intercourse:  not active   PREGNANCY: Vaginal deliveries 3 Tearing Yes: thinks so with all three C-section deliveries 0 Currently pregnant No  PROLAPSE: None   OBJECTIVE:   DIAGNOSTIC FINDINGS:     COGNITION: Overall cognitive status: Within functional limits for tasks assessed     SENSATION: Light touch: Appears intact Proprioception: Appears intact  MUSCLE LENGTH: Bil  hamstrings and adductors limited by 50%   GAIT: Distance walked: 200' Assistive device utilized: None Level of assistance: Complete Independence Comments: decreased cadence, decreased stride length, decreased Rt step height  POSTURE: rounded shoulders, forward head, increased thoracic kyphosis, posterior pelvic tilt, and right lateral lean  PELVIC ALIGNMENT: Rt pelvic obliquity   LUMBARAROM/PROM:  A/PROM A/PROM  eval  Flexion Decreased by 75%  Extension Decreased by 50%  Right lateral flexion Decreased by 75%  Left lateral flexion Decreased by 75%  Right rotation Decreased by 75%  Left rotation Decreased by 75%   (Blank rows = not tested)  LOWER EXTREMITY ROM:  WFL  LOWER EXTREMITY MMT:  Rt hip grossly 3+/5 except flexion 3/5; Lt hip grossly 3+/5, knees 4/5 05/20/23 Rt hip grossly 4/5 except flexion 3+/5; Lt hip grossly 4/5, knees 4/+5 PALPATION:   General  no TTP but does have noted fascial restrictions in all quadrants of abdomen                External Perineal Exam pt deferred                             Internal Pelvic Floor pt deferred  Patient confirms identification and approves PT to assess internal pelvic floor and treatment No 05/20/23 - continues to deny PELVIC MMT:   MMT eval  Vaginal   Internal Anal Sphincter   External Anal Sphincter   Puborectalis   Diastasis Recti   (Blank rows = not tested)        TONE: Pt deferred  PROLAPSE: Pt deferred  TODAY'S TREATMENT:  DATE:  05/20/23  Green band 2x10: bil horizontal abduction, rows, and diagonals Pelvic tilts in sitting 2x10 Pelvic circles  x10 rt and lt in sitting Updated HEP to include isometrics of pelvic floor for up to 10s as able. And reviewed this with pt. POC and goals also reviewed with pt.   PATIENT EDUCATION:  Education details: abdominal massage and voiding  mechanics  AENJNADL Person educated: Patient Education method: Explanation, Demonstration, Tactile cues, Verbal cues, and Handouts Education comprehension: verbalized understanding and returned demonstration  HOME EXERCISE PROGRAM: abdominal massage and voiding mechanics  ASSESSMENT:  CLINICAL IMPRESSION: Patient presents for treatment,  reports she was feeling like she has made improvement but this past week she had not been able to have a bowel movement for 5 days and has been having leakage of stool with all urges. Pt does report she is no longer having urinary leakage, overall she has seen improvement in bowel movements and going fairly consistently to at least 3x per week for the past couple of months. Does plan to let referring provider know as she requested pt to return once voiding at least 3x per week. Pt reports she is bothered by leakage and community outings and also her blood pressure changes recently. Her doctors are following this. Pt has met all STG and 2/6 LTG. Pt has denied internal pelvic floor assessment to date with PFPT therefore this has not been assessed and pt has been educated on how this may be helpful for additional gains with strength and coordination. Pt reports she plans to discuss with doctor. Pt would benefit from additional PT to further address deficits.    OBJECTIVE IMPAIRMENTS: decreased coordination, decreased endurance, decreased mobility, difficulty walking, decreased strength, increased fascial restrictions, impaired flexibility, improper body mechanics, and postural dysfunction.   ACTIVITY LIMITATIONS: continence  PARTICIPATION LIMITATIONS: shopping and community activity  PERSONAL FACTORS: Time since onset of injury/illness/exacerbation are also affecting patient's functional outcome.   REHAB POTENTIAL: Good  CLINICAL DECISION MAKING: Stable/uncomplicated  EVALUATION COMPLEXITY: Low   GOALS: Goals reviewed with patient? Yes  SHORT TERM GOALS:  Target date: 04/09/23  Pt to be I with HEP.  Baseline: Goal status: MET  2.  Pt to be I with voiding and breathing mechanics and abdominal massage for improved bowel habits.  Baseline:  Goal status: MET  3.  Pt to be I with urge drill and knack method to improve urinary leakage.  Baseline:  Goal status: MET   LONG TERM GOALS: Target date: 06/11/23  Pt to be I with advanced HEP.  Baseline:  Goal status: MET  2.  Pt to demonstrate at least 5/5 bil hip strength for improved pelvic stability and functional squats without leakage.  Baseline:  Goal status: on going  3.  Pt to demonstrate improved coordination of pelvic floor and breathing mechanics with squat with appropriate synergistic patterns to decrease pain and leakage at least 75% of the time.    Baseline:  Goal status: on going  4.  Pt will report 3 BMs per week due to improved muscle tone and coordination with BMs.  Baseline:  Goal status: MET  5.  Pt will report her BMs are complete due to improved bowel habits and evacuation techniques.  Baseline:  Goal status: on going  6.  Pt to demonstrate at least 3/5 pelvic floor strength and holds for at least 8s for improved pelvic stability and decreased strain at pelvic floor/ decrease leakage.  Baseline:  Goal status: pt has deferred  internal pelvic floor assessment   PLAN:  PT FREQUENCY: every other week  PT DURATION:  6 sessions  PLANNED INTERVENTIONS: Therapeutic exercises, Therapeutic activity, Neuromuscular re-education, Balance training, Patient/Family education, Self Care, Joint mobilization, Dry Needling, Electrical stimulation, Spinal mobilization, Cryotherapy, Moist heat, scar mobilization, Biofeedback, and Manual therapy  PLAN FOR NEXT SESSION: internal if needed and pt consents, breathing and voiding mechanics, coordination of pelvic floor and breathing/core and exercises  Otelia Sergeant, PT, DPT 07/02/242:48 PM

## 2023-05-22 ENCOUNTER — Encounter (HOSPITAL_BASED_OUTPATIENT_CLINIC_OR_DEPARTMENT_OTHER): Payer: Self-pay | Admitting: Cardiology

## 2023-05-24 ENCOUNTER — Encounter: Payer: Self-pay | Admitting: Gastroenterology

## 2023-05-26 ENCOUNTER — Ambulatory Visit (INDEPENDENT_AMBULATORY_CARE_PROVIDER_SITE_OTHER): Payer: Medicare Other | Admitting: Family

## 2023-05-26 ENCOUNTER — Encounter (HOSPITAL_BASED_OUTPATIENT_CLINIC_OR_DEPARTMENT_OTHER): Payer: Self-pay | Admitting: Family

## 2023-05-26 VITALS — BP 132/72 | HR 63 | Ht <= 58 in | Wt 113.5 lb

## 2023-05-26 DIAGNOSIS — K5900 Constipation, unspecified: Secondary | ICD-10-CM

## 2023-05-26 DIAGNOSIS — R0989 Other specified symptoms and signs involving the circulatory and respiratory systems: Secondary | ICD-10-CM

## 2023-05-26 DIAGNOSIS — E785 Hyperlipidemia, unspecified: Secondary | ICD-10-CM | POA: Diagnosis not present

## 2023-05-26 DIAGNOSIS — I25118 Atherosclerotic heart disease of native coronary artery with other forms of angina pectoris: Secondary | ICD-10-CM | POA: Diagnosis not present

## 2023-05-26 DIAGNOSIS — F411 Generalized anxiety disorder: Secondary | ICD-10-CM

## 2023-05-26 MED ORDER — OLMESARTAN MEDOXOMIL 20 MG PO TABS
20.0000 mg | ORAL_TABLET | Freq: Two times a day (BID) | ORAL | 3 refills | Status: DC
Start: 2023-05-26 — End: 2024-03-23

## 2023-05-26 NOTE — Progress Notes (Signed)
Cardiology Office Note:  .   Date:  05/26/2023  ID:  [name], DOB June 16, 1941, MRN [id number] PCP: Teresa Garter, MD  [facility] Providers Cardiologist:  Teresa Red, MD    History of Present Illness: [name]   [name] is a 82 y.o. female  hx of HTN, CAD (nonobstructive by cath 2018), HLD, PAF during hospitalization 2017 last seen 617/24.   Prior patient of Dr. Okey Stein.  Established with Dr. Cristal Stein 07/19/2019.  Longstanding history of labile blood pressure and orthostatic hypotension.  Previously has been told to continue losartan 25 mg twice daily and hold if BP less than 110.  Amlodipine also used previously.  At visit 04/15/2023 she was recommended to try taking her initial dose of losartan at lunchtime to see if it helped with her lightheaded spells.  Last seen 05/05/23 with BP 186/82 and labile home readings 100/70, 204/120, 158/73. She was taking Losartan half tablet BID and was no longer taking Amlodipine. She was transitioned to Olmesartan 20mg  at bedtime and 24h BP cuff ordered. Via phone call 05/15/23 it was increased to 20mg  BID.   Presents today for follow-up independently. Pleasant lady who resides at [facility]. Checking BP morning (prior to medications), lunchtime, and evening (prior to medications). Her average lunchtime BP is 136/83 which is most reflective of true blood pressure as it is after medications.  She notes that even when she was taking her medicine only in the evening she still had continued GI symptoms after her first meal.  This has not worsened since adding olmesartan in the morning.  In the morning she has a couple coffee, piece of toast and her medications then shortly thereafter will feel shaky, sweaty.  This is not associated with hypotension.  Continues to have issues with constipation going up to 6 days without a BM.  She does not eat a midday meal.  May sometimes eat some chips and guacamole as a snack.  For dinner she eats a  protein, starch, veggie, glass of wine and does not have any symptoms afterward.  She has follow-up with GI but is not until September.  Started on lorazepam twice daily as needed by her PCP which she is taking daily around lunchtime with improvement in her "shakiness". She does note not as interested in activities and wonders if she might be depressed.   BP home log in afternoon: 115/90, 156/86, 126/82, 111/70, 129/77, 165/88, 147/89 Prior to medications as high as 196/104  ROS: EKG is not ordered today.   Studies Reviewed: .         Cardiac Studies & Procedures   CARDIAC CATHETERIZATION  CARDIAC CATHETERIZATION 04/07/2017  Narrative Conclusions: 1. Minimal coronary artery disease involving the large epicardial coronary arteries. Mild to moderate stenosis of diagonal branches is evident. 2. Normal left ventricular contraction. 3. Normal left ventricular systolic function. 4. Small right radial artery.  Recommendations: 1. Continue medical therapy and risk factor modification.  Teresa Kendall, MD [facility] Pager: [phone]  Findings Coronary Findings Diagnostic  Dominance: Right  Left Main Vessel is large. Vessel is angiographically normal.  Left Anterior Descending Vessel is large. The vessel exhibits minimal luminal irregularities.  First Diagonal Branch Vessel is moderate in size.  Second Diagonal Branch Vessel is small in size.  Third Diagonal Branch Vessel is small in size.  Left Circumflex Vessel is large. Vessel is angiographically normal.  First Obtuse Marginal Branch Vessel is small in size.  Second Obtuse Marginal Branch Vessel  is moderate in size.  Third Obtuse Marginal Branch Vessel is moderate in size.  Right Coronary Artery Vessel is large. Vessel is angiographically normal.  Right Posterior Descending Artery Vessel is moderate in size.  Right Posterior Atrioventricular Artery Vessel is moderate in  size.  Intervention  No interventions have been documented.   STRESS TESTS  MYOCARDIAL PERFUSION IMAGING 03/24/2017  Narrative  Nuclear stress EF: 67%.  There was no ST segment deviation noted during stress.  There is a small defect of mild severity present in the basal inferoseptal and mid inferoseptal location. The defect is partially reversible and could represent variations in diaphragmatic attenuation vs. ischemia.  There is a small defect of moderate severity present in the apex location. The defect is reversible and partially reversible.  There is a medium defect of moderate severity present in the basal anteroseptal, mid anteroseptal and apical septal location. The defect is reversible and worrisome for ischemia.  The left ventricular ejection fraction is hyperdynamic (>65%).  This is a high risk study.   ECHOCARDIOGRAM  ECHOCARDIOGRAM COMPLETE 04/17/2021  Narrative ECHOCARDIOGRAM REPORT    Patient Name:   [name] Date of Exam: 04/17/2021 Medical Rec #:  [id number]      Height:       59.0 in Accession #:    [id number]     Weight:       111.0 lb Date of Birth:  02/10/1941      BSA:          1.436 m Patient Age:    80 years       BP:           144/86 mmHg Patient Gender: F              HR:           62 bpm. Exam Location:  [address]  Procedure: 2D Echo, Cardiac Doppler and Color Doppler  Indications:    I10 Hypertension  History:        Patient has prior history of Echocardiogram examinations, most recent 01/22/2016. Risk Factors:Hypertension and Dyslipidemia. Chronic kidney disease. Paroxysmal atrial fibrillation. Fatigue. Scoliosis. Murmur. Chest pain.  Sonographer:    [name] RDCS Referring Phys: [id number] [name]  IMPRESSIONS   1. Left ventricular ejection fraction, by estimation, is 60 to 65%. The left ventricle has normal function. The left ventricle has no regional wall motion abnormalities. There is severe  asymmetric left ventricular hypertrophy of the basal-septal segment. Left ventricular diastolic parameters are consistent with Grade I diastolic dysfunction (impaired relaxation). 2. Right ventricular systolic function is normal. The right ventricular size is normal. There is normal pulmonary artery systolic pressure. The estimated right ventricular systolic pressure is 32.4 mmHg. 3. Left atrial size was mildly dilated. 4. The mitral valve is abnormal. Trivial mitral valve regurgitation. 5. The aortic valve is tricuspid. Aortic valve regurgitation is mild. Mild to moderate aortic valve sclerosis/calcification is present, without any evidence of aortic stenosis. Aortic regurgitation PHT measures 517 msec. 6. The inferior vena cava is dilated in size with >50% respiratory variability, suggesting right atrial pressure of 8 mmHg.  Comparison(s): Changes from prior study are noted. 01/22/2016: LVEF 55-60%, mild LAE, RVSP 49 mmHg.  FINDINGS Left Ventricle: Left ventricular ejection fraction, by estimation, is 60 to 65%. The left ventricle has normal function. The left ventricle has no regional wall motion abnormalities. The left ventricular internal cavity size was normal in size. There is severe asymmetric left ventricular hypertrophy of  the basal-septal segment. Left ventricular diastolic parameters are consistent with Grade I diastolic dysfunction (impaired relaxation). Indeterminate filling pressures.  Right Ventricle: The right ventricular size is normal. No increase in right ventricular wall thickness. Right ventricular systolic function is normal. There is normal pulmonary artery systolic pressure. The tricuspid regurgitant velocity is 2.47 m/s, and with an assumed right atrial pressure of 8 mmHg, the estimated right ventricular systolic pressure is 32.4 mmHg.  Left Atrium: Left atrial size was mildly dilated.  Right Atrium: Right atrial size was normal in size.  Pericardium: There is no evidence  of pericardial effusion.  Mitral Valve: The mitral valve is abnormal. There is mild thickening of the mitral valve leaflet(s). Trivial mitral valve regurgitation.  Tricuspid Valve: The tricuspid valve is grossly normal. Tricuspid valve regurgitation is mild.  Aortic Valve: The aortic valve is tricuspid. Aortic valve regurgitation is mild. Aortic regurgitation PHT measures 517 msec. Mild to moderate aortic valve sclerosis/calcification is present, without any evidence of aortic stenosis.  Pulmonic Valve: The pulmonic valve was grossly normal. Pulmonic valve regurgitation is trivial.  Aorta: The aortic root and ascending aorta are structurally normal, with no evidence of dilitation.  Venous: The inferior vena cava is dilated in size with greater than 50% respiratory variability, suggesting right atrial pressure of 8 mmHg.  IAS/Shunts: No atrial level shunt detected by color flow Doppler.   LEFT VENTRICLE PLAX 2D LVIDd:         2.90 cm  Diastology LVIDs:         1.90 cm  LV e' medial:    4.57 cm/s LV PW:         1.30 cm  LV E/e' medial:  14.8 LV IVS:        1.60 cm  LV e' lateral:   6.74 cm/s LVOT diam:     2.10 cm  LV E/e' lateral: 10.0 LV SV:         78 LV SV Index:   55 LVOT Area:     3.46 cm   RIGHT VENTRICLE             IVC RV Basal diam:  3.60 cm     IVC diam: 2.10 cm RV S prime:     16.00 cm/s TAPSE (M-mode): 2.0 cm  LEFT ATRIUM             Index       RIGHT ATRIUM           Index LA diam:        3.70 cm 2.58 cm/m  RA Area:     11.40 cm LA Vol (A2C):   38.4 ml 26.74 ml/m RA Volume:   27.30 ml  19.01 ml/m LA Vol (A4C):   31.1 ml 21.66 ml/m LA Biplane Vol: 36.3 ml 25.28 ml/m AORTIC VALVE LVOT Vmax:   108.50 cm/s LVOT Vmean:  66.400 cm/s LVOT VTI:    0.226 m AI PHT:      517 msec  AORTA Ao Root diam: 3.50 cm Ao Asc diam:  3.90 cm  MITRAL VALVE               TRICUSPID VALVE MV Area (PHT): 3.03 cm    TR Peak grad:   24.4 mmHg MV Decel Time: 250 msec    TR  Vmax:        247.00 cm/s MV E velocity: 67.70 cm/s MV A velocity: 67.70 cm/s  SHUNTS MV E/A ratio:  1.00  Systemic VTI:  0.23 m Systemic Diam: 2.10 cm  Zoila Shutter MD Electronically signed by Zoila Shutter MD Signature Date/Time: 04/17/2021/1:55:32 PM    Final    MONITORS  CARDIAC EVENT MONITOR 02/19/2016  Narrative NSR with PACs.  No atrial fibrillation.            Risk Assessment/Calculations:            Physical Exam:   VS:  BP 132/72 (BP Location: Right Arm, Patient Position: Sitting, Cuff Size: Normal)   Pulse 63   Ht 4\' 10"  (1.473 m)   Wt 113 lb 8 oz (51.5 kg)   SpO2 96%   BMI 23.72 kg/m    Wt Readings from Last 3 Encounters:  05/26/23 113 lb 8 oz (51.5 kg)  05/05/23 115 lb 3.2 oz (52.3 kg)  04/23/23 115 lb (52.2 kg)    GEN: Well nourished, thin, well developed in no acute distress NECK: No JVD; No carotid bruits CARDIAC: RRR, no murmurs, rubs, gallops RESPIRATORY:  Clear to auscultation without rales, wheezing or rhonchi  ABDOMEN: Soft, non-tender, non-distended EXTREMITIES:  No edema; No deformity   ASSESSMENT AND PLAN: .    Labile HTN / Orthostatic hypotension -initial BP in clinic 144/78 with repeat 132/72.  Presently on olmesartan 20 mg twice daily which has improved to the level lability of her blood pressure.  Her average BP in the afternoon which is after medications is 136/83 which is reasonable.  Continue present regimen olmesartan 20 mg twice daily. Refill provided.  24h BP monitor was ordered at last clinic visit 05/05/2023 and is awaiting scheduling.  Anticipate will take 4 to 6 weeks for scheduling.  CAD / HLD - Stable with no anginal symptoms. No indication for ischemic evaluation.  Minimal CAD by [facility] 2018. No BB due to bradycardia. Continue Aspirin. Lipids presently managed with diet/exercise. Consider updating lipid panel with next fasting labs.   Anxiety - Presently on Lorazepam 0.5mg  BID PRN - she is taking 0.25mg  every afternoon with  improvement in her "shakiness" per her report. She inquires if she might be depressed as she is less interested in participating in activities but still participates in some. Discussed recent health issues and feeling poorly could certain exacerbate depression. No suicidal ideation. We discussed possible long term agent (I.e. SSRI) if appropriate and encouraged her to reach out to her PCP to discuss further assessment.   Constipation / Nausea - Notes QAM will have episode of sweating, shakiness after morning coffee/toast. This does not occur after her evening meal of protein, starch, veggie. Symptoms were not improved when her blood pressure regimen was reduced to evening dosing only. Will go up to 6 days without BM. Using PRN Miralax approx 3x/week. Encouraged her to follow up with GI.        Dispo: follow up in 4 months  Signed, Alver Sorrow, NP

## 2023-05-26 NOTE — Patient Instructions (Signed)
Medication Instructions:  Your physician recommends that you continue on your current medications as directed. Please refer to the Current Medication list given to you today.  *If you need a refill on your cardiac medications before your next appointment, please call your pharmacy*  Testing/Procedures: You are still on the waitlist for the 24 hour blood pressure cuff.    Follow-Up: At [facility], you and your health needs are our priority.  As part of our continuing mission to provide you with exceptional heart care, we have created designated Provider Care Teams.  These Care Teams include your primary Cardiologist (physician) and Advanced Practice Providers (APPs -  Physician Assistants and Nurse Practitioners) who all work together to provide you with the care you need, when you need it.  We recommend signing up for the patient portal called "MyChart".  Sign up information is provided on this After Visit Summary.  MyChart is used to connect with patients for Virtual Visits (Telemedicine).  Patients are able to view lab/test results, encounter notes, upcoming appointments, etc.  Non-urgent messages can be sent to your provider as well.   To learn more about what you can do with MyChart, go to [email or web address].    Your next appointment:   4 months with Dr. Cristal Deer or Gillian Shields, NP

## 2023-05-27 ENCOUNTER — Telehealth: Payer: Self-pay

## 2023-05-27 NOTE — Telephone Encounter (Signed)
Called patient to inform the recommendations of Ms. Zerita Boers, [place]. Instructed to call back if no change or improvement in 2 weeks. Patient agreed.

## 2023-05-27 NOTE — Telephone Encounter (Signed)
-----   Message from Unk Lightning, [place] sent at 05/27/2023  8:16 AM EDT ----- Regarding: call and check on Just got this note from her PCP today.  After reviewing his note lets have her go ahead and stop drinking coffee first thing in the morning and replace it with water.  She needs to start MiraLAX daily.  She can call and let us know if this helps at all over the next couple of weeks, if not we can look at getting her in sooner.  Thanks, JL L ----- Message ----- From: Alver Sorrow, NP Sent: 05/26/2023   4:51 PM EDT To: Napoleon Form, MD; #  Hi Dr. Jefferey Pica, I saw Miss Chargois today. Presently has OV in September with Victorino Dike. She was having episode of feeling shaky, sweaty after her morning meal/meds. We stopped all morning antihypertensives and episodes continued. She hasn't had any hypotensive episodes so I don't think her meds are contributory. She only has the episodes after her morning meal, not evening meal. Also more difficulties with constipation recently. I tried to detail in my note but please let me know if you have questions! I just told her I would update you both with her symptoms as she was concerned about waiting til September.

## 2023-06-02 ENCOUNTER — Ambulatory Visit: Payer: Medicare Other

## 2023-06-03 ENCOUNTER — Encounter: Payer: Medicare Other | Admitting: Physical Therapy

## 2023-06-03 ENCOUNTER — Ambulatory Visit: Payer: Medicare Other | Attending: Family

## 2023-06-03 DIAGNOSIS — R030 Elevated blood-pressure reading, without diagnosis of hypertension: Secondary | ICD-10-CM

## 2023-06-03 NOTE — Progress Notes (Unsigned)
24 hour ambulatory blood pressure monitor applied to patients left arm using standard adult cuff.  Dr. Jodelle Red to read.

## 2023-06-09 ENCOUNTER — Telehealth: Payer: Self-pay | Admitting: Cardiology

## 2023-06-09 NOTE — Telephone Encounter (Signed)
Will forward to [facility] (who saw patient last) and Dr Cristal Deer for review

## 2023-06-09 NOTE — Telephone Encounter (Signed)
Pt sent this Mychart to our Scheduling pool:     My granddaughter returned the monitor on July 18.   When and from whom do I get results.   [name] [birth year]

## 2023-06-11 ENCOUNTER — Telehealth (HOSPITAL_BASED_OUTPATIENT_CLINIC_OR_DEPARTMENT_OTHER): Payer: Self-pay | Admitting: Family

## 2023-06-11 NOTE — Telephone Encounter (Signed)
Good morning,  The results were forwarded to [facility] (who saw patient last) and Dr Cristal Deer for review.  We will notify you once those results have been reviewed by a provider.   Regards,  Jim Like MHA RN CCM

## 2023-06-11 NOTE — Telephone Encounter (Signed)
Received a message via patient schedule stating:  "My granddaughter returned the monitor on July 18.   When and from whom do I get results."  Please advise.

## 2023-06-12 NOTE — Telephone Encounter (Signed)
Returned call to patient, advised her of the following recommendations. Patient states she will try the Meclizine. Patient scheduled for followup with dr. Cristal Deer.    "If BP not <120/80 would Olmesartan to be causing dizziness. Would try Meclizine PRN OTC if similar to vertigo.    If concerned about medications, why don't we get her set up to see Dr. Cristal Deer within the next couple months for a sooner in office visit to re-evaluate BP and medications.    Alver Sorrow, NP"

## 2023-06-12 NOTE — Telephone Encounter (Signed)
24h BP monitor with average BP 152/69 (awake 155/71 and asleep 137/60 - expected lower BP during sleeping). BP does show marked lability. Hesitant to over-treat BP and cause lightheadedness or dizziness. To help lower overall average, let's add Amlodipine 2.5mg  every evening. Continue Olmesartan 20mg  twice daily. I do think a goal of <140/90 would be reasonable for her to ensure safety, reduce fall risk.   Alver Sorrow, NP

## 2023-06-12 NOTE — Telephone Encounter (Signed)
Returned call to patient and began to review the following recommendations. Patient asks if she could be allergic to medications. Advised patient that it would depend on how she was feeling. She states she feels almost like she has vertigo. She states that she felt this way before which is why she was switched from losartan to olmesartan. She states she doesn't really keep track of things. She wonders if she needs to take so much medication. Advised patient BP is not well controlled on  just the olmesartan which is why we need to add something else.      "24h BP monitor with average BP 152/69 (awake 155/71 and asleep 137/60 - expected lower BP during sleeping). BP does show marked lability. Hesitant to over-treat BP and cause lightheadedness or dizziness. To help lower overall average, let's add Amlodipine 2.5mg  every evening. Continue Olmesartan 20mg  twice daily. I do think a goal of <140/90 would be reasonable for her to ensure safety, reduce fall risk.    Alver Sorrow, NP"

## 2023-06-12 NOTE — Telephone Encounter (Signed)
If BP not <120/80 would Olmesartan to be causing dizziness. Would try Meclizine PRN OTC if similar to vertigo.   If concerned about medications, why don't we get her set up to see Dr. Cristal Deer within the next couple months for a sooner in office visit to re-evaluate BP and medications.   Alver Sorrow, NP

## 2023-06-17 ENCOUNTER — Ambulatory Visit: Payer: Medicare Other | Admitting: Physical Therapy

## 2023-06-17 DIAGNOSIS — M6281 Muscle weakness (generalized): Secondary | ICD-10-CM

## 2023-06-17 DIAGNOSIS — R279 Unspecified lack of coordination: Secondary | ICD-10-CM

## 2023-06-17 DIAGNOSIS — R293 Abnormal posture: Secondary | ICD-10-CM

## 2023-06-17 NOTE — Therapy (Signed)
OUTPATIENT PHYSICAL THERAPY FEMALE PELVIC EVALUATION   Patient Name: [name] MRN: [id number] DOB:1941/09/13, 82 y.o., female Today's Date: 06/17/2023  END OF SESSION:  PT End of Session - 06/17/23 1422     Visit Number 6    Date for PT Re-Evaluation 08/06/23    Authorization Type Medicare    Progress Note Due on Visit 10    PT Start Time 1400    PT Stop Time 1440    PT Time Calculation (min) 40 min    Activity Tolerance Patient tolerated treatment well    Behavior During Therapy [facility] for tasks assessed/performed               Past Medical History:  Diagnosis Date   Abdominal pain, unspecified site    ADVERSE REACTION TO MEDICATION 07/31/2009   ANEMIA-NOS 07/07/2007   Arthritis    Chest pain, unspecified    Chronic kidney disease    hx of kidney stone   COLONIC POLYPS, ADENOMATOUS, HX OF    Complication of anesthesia    Disturbance of skin sensation    DIVERTICULOSIS, COLON 02/23/2009   Family history of diabetes mellitus    Family history of malignant neoplasm of breast    FATIGUE 05/25/2009   Fever, unspecified    Heart murmur    hx of   HYPERLIPIDEMIA 07/07/2007   HYPERPARATHYROIDISM UNSPECIFIED 10/20/2007   HYPERTENSION 02/04/2008   Irritable bowel syndrome (IBS)    NEPHROLITHIASIS, HX OF 11/15/2008   OSTEOPOROSIS 01/01/2008   Other acquired absence of organ    parathyroidectomy   Pain in limb    PALPITATIONS, OCCASIONAL 07/12/2008   PARATHYROIDECTOMY 01/02/2009   PONV (postoperative nausea and vomiting)    SCOLIOSIS 05/25/2009   Seasonal allergies    Unspecified constipation    URINARY INCONTINENCE 07/07/2007   UTI'S, RECURRENT 11/20/2009   kimbrough    Past Surgical History:  Procedure Laterality Date   BUNIONECTOMY     CATARACT EXTRACTION, BILATERAL     COLONOSCOPY     EXTRACORPOREAL SHOCK WAVE LITHOTRIPSY Left 12/21/2020   Procedure: EXTRACORPOREAL SHOCK WAVE LITHOTRIPSY (ESWL);  Surgeon: Jerilee Field, MD;  Location: [facility];   Service: Urology;  Laterality: Left;   LAPAROSCOPIC APPENDECTOMY  01/19/2016   Procedure: APPENDECTOMY LAPAROSCOPIC with orifice of cecal polyp;  Surgeon: Romie Levee, MD;  Location: [facility];  Service: General;;   LEFT HEART CATH AND CORONARY ANGIOGRAPHY N/A 04/07/2017   Procedure: Left Heart Cath and Coronary Angiography;  Surgeon: [name], MD;  Location: [facility];  Service: Cardiovascular;  Laterality: N/A;   PARATHYROIDECTOMY     Rt Superior open neck exploration   POLYPECTOMY     RIGHT OOPHORECTOMY     TONSILLECTOMY     Patient Active Problem List   Diagnosis Date Noted   Chills 03/31/2023   Lumbar radiculopathy 03/18/2023   Lumbar spondylosis 03/18/2023   Scoliosis deformity of spine 03/18/2023   Right sided weakness 03/18/2023   Somnolence, daytime 03/18/2023   Chronic bilateral low back pain without sciatica [M54.50, G89.29] 01/08/2023   Edema 01/08/2023   Ankle pain, left 11/22/2022   Other specified abnormal immunological findings in serum 04/03/2022   Pathological fracture of vertebra 04/03/2022   URI (upper respiratory infection) 04/03/2022   Allergic rhinitis 04/03/2022   Cough 04/03/2022   Anxiety disorder 06/12/2021   Unsteady gait when walking 01/19/2021   Diplopia 01/19/2021   Late onset Alzheimer's dementia without behavioral disturbance (HCC) 01/19/2021   Chronic  idiopathic constipation 01/19/2021   Labile blood pressure 01/19/2021   Acute parotitis 03/09/2019   Coronary artery disease involving native coronary artery of native heart without angina pectoris 04/28/2017   Syncope 04/07/2017   Shortness of breath 04/07/2017   Abnormal stress test 04/07/2017   Sinus bradycardia 02/21/2017   Coronary artery calcification 02/21/2017   Compression fracture of body of thoracic vertebra (HCC) 02/08/2017   Dizziness and giddiness 02/08/2017   Atrial fibrillation with rapid ventricular response (HCC) 01/21/2016   Colon polyp s/p appy/partialcecetomy  01/19/3016 01/19/2016   Orthostatic dizziness 04/25/2014   Vertigo 12/26/2012   Hypertension, uncontrolled 09/11/2012   Diastolic dysfunction 09/11/2012   Sleep disturbance 09/17/2011   Hypotension 12/26/2010   Orthostatic hypotension 12/26/2010   UTI'S, RECURRENT 11/20/2009   ADVERSE REACTION TO MEDICATION 07/31/2009   SCOLIOSIS 05/25/2009   FATIGUE 05/25/2009   DIVERTICULOSIS, COLON 02/23/2009   COLONIC POLYPS, ADENOMATOUS, HX OF 02/23/2009   PARATHYROIDECTOMY 01/02/2009   NEPHROLITHIASIS, HX OF 11/15/2008   PALPITATIONS, OCCASIONAL 07/12/2008   Essential hypertension 02/04/2008   Constipation 01/01/2008   LEG PAIN, LEFT 01/01/2008   Osteoporosis 01/01/2008   Thoracic back pain 12/04/2007   HYPERPARATHYROIDISM UNSPECIFIED 10/20/2007   HYPERLIPIDEMIA 07/07/2007   ANEMIA-NOS 07/07/2007   URINARY INCONTINENCE 07/07/2007    PCP: Plotnikov, Georgina Quint, MD  REFERRING PROVIDER: Napoleon Form, MD  REFERRING DIAG: K19.02 (ICD-10-CM) - Dyssynergic defecation  THERAPY DIAG:  Muscle weakness (generalized)  Unspecified lack of coordination  Abnormal posture  Rationale for Evaluation and Treatment: Rehabilitation  ONSET DATE: years  SUBJECTIVE:                                                                                                                                                                                           SUBJECTIVE STATEMENT: Pt reports she had a "rough couple of weeks", has been monitoring BP and MD following this. Has gotten follow up with Dr. Lavon Paganini. Taking 1/2 cap full nightly and only having bowel movement every 4-5 days again. Pt did have one large full loss of bowels without urge. And then had a very large bowel movement in toilet with this. Did end up taking an imodium because it wasn't stopping. However then didn't have another bowel movement for 5 days.   Fluid intake: Yes: water - 30oz usually a day    PAIN:  Are you having pain?  No  PRECAUTIONS: None  WEIGHT BEARING RESTRICTIONS: No  FALLS:  Has patient fallen in last 6 months? Yes. Number of falls a few days ago, fell on a step - reports difficulties with Rt leg sometimes  LIVING ENVIRONMENT: Lives  with: alone - in I living  Lives in: House/apartment   OCCUPATION: retired   PLOF: Independent  PATIENT GOALS: to have less leakage  PERTINENT HISTORY:  history of large tubulovillous adenoma in cecum status post endoscopic removal, was extending into appendiceal orifice with incomplete removal status post extended appendectomy and partial cecotomy March 2017. Chronic irritable bowel syndrome with alternating constipation and diarrhea. severe sigmoid diverticulosis and internal hemorrhoids.does report having type 4-5 stool with urge and thinking this is more normal bowel movement then has the larger bowel movement without warning type 6 and unable to stop it.   Sexual abuse: No  BOWEL MOVEMENT: Pain with bowel movement: No Type of bowel movement:Type ([place] Stool Scale) 1-7, Frequency sometimes goes a week without having bowel movement then goes for a few days, and Strain No Fully empty rectum: Yes:   Leakage: Yes:   Pads: Yes: small pad for urinary leakage and wears a depend when she is going a lot Fiber supplement: No but does take mirilax and prunes   URINATION: Pain with urination: No Fully empty bladder: Yes:   Stream: Strong and Weak Urgency: Yes:   Frequency: right at 2 hours Leakage: Urge to void, Coughing, Sneezing, Laughing, Exercise, and Bending forward Pads: Yes:    INTERCOURSE: Pain with intercourse:  not active   PREGNANCY: Vaginal deliveries 3 Tearing Yes: thinks so with all three C-section deliveries 0 Currently pregnant No  PROLAPSE: None   OBJECTIVE:   DIAGNOSTIC FINDINGS:     COGNITION: Overall cognitive status: Within functional limits for tasks assessed     SENSATION: Light touch: Appears  intact Proprioception: Appears intact  MUSCLE LENGTH: Bil hamstrings and adductors limited by 50%   GAIT: Distance walked: 200' Assistive device utilized: None Level of assistance: Complete Independence Comments: decreased cadence, decreased stride length, decreased Rt step height  POSTURE: rounded shoulders, forward head, increased thoracic kyphosis, posterior pelvic tilt, and right lateral lean  PELVIC ALIGNMENT: Rt pelvic obliquity   LUMBARAROM/PROM:  A/PROM A/PROM  eval  Flexion Decreased by 75%  Extension Decreased by 50%  Right lateral flexion Decreased by 75%  Left lateral flexion Decreased by 75%  Right rotation Decreased by 75%  Left rotation Decreased by 75%   (Blank rows = not tested)  LOWER EXTREMITY ROM:  WFL  LOWER EXTREMITY MMT:  Rt hip grossly 3+/5 except flexion 3/5; Lt hip grossly 3+/5, knees 4/5 05/20/23 Rt hip grossly 4/5 except flexion 3+/5; Lt hip grossly 4/5, knees 4/+5 PALPATION:   General  no TTP but does have noted fascial restrictions in all quadrants of abdomen                External Perineal Exam pt deferred                             Internal Pelvic Floor pt deferred  Patient confirms identification and approves PT to assess internal pelvic floor and treatment No 05/20/23 - continues to deny PELVIC MMT:   MMT eval  Vaginal   Internal Anal Sphincter   External Anal Sphincter   Puborectalis   Diastasis Recti   (Blank rows = not tested)        TONE: Pt deferred  PROLAPSE: Pt deferred  TODAY'S TREATMENT:  DATE:  06/17/23  Pt presents and had several questions about POC and reviewing of recommendations as she has had a setback with progress. Pt was having improvement with no longer having leakage and have ~3 bowel movements per week. However recently having 1 bowel movement per week and has had 1-2  large losses of stool type 6-5 and unable to stop it following a type 4 bowel movement earlier in the day. Pt has several other health concerns at this time and reports she has follow ups with multiple doctors for concerns including her primary and referring doctor. Pt deferred internal assessment at this time and requests would like to wait until she sees Dr. Lavon Paganini again.  PT reviewed abdominal massage, voiding mechanics, importance of fiber intake and water intake. Pt verbalized understanding. Pt expressed her frustration with stool difficulties and PT provided emotional support. Pt reports she is pleased with progress with urinary leakage which is much less, and she is not having hard painful stools any longer which is helpful for her. But would like to be on hold until she is able to meet with Dr. Lavon Paganini again. PT agreed.   PATIENT EDUCATION:  Education details: abdominal massage and voiding mechanics  AENJNADL Person educated: Patient Education method: Explanation, Demonstration, Tactile cues, Verbal cues, and Handouts Education comprehension: verbalized understanding and returned demonstration  HOME EXERCISE PROGRAM: abdominal massage and voiding mechanics  ASSESSMENT:  CLINICAL IMPRESSION: Patient presents for treatment,  reports she was feeling like she has made improvement but this past week she had not been able to have a bowel movement for 5 days and has been having leakage of stool with all urges. Pt does report she is no longer having urinary leakage, overall she has seen improvement in bowel movements and going fairly consistently to at least 3x per week for the past couple of months. Does plan to let referring provider know as she requested pt to return once voiding at least 3x per week. Pt reports she is bothered by leakage and community outings and also her blood pressure changes recently. Her doctors are following this. Pt has met all STG and 2/6 LTG. Pt has denied internal  pelvic floor assessment to date with PFPT therefore this has not been assessed and pt has been educated on how this may be helpful for additional gains with strength and coordination. Pt reports she plans to discuss with doctor. Pt would benefit from additional PT to further address deficits.    OBJECTIVE IMPAIRMENTS: decreased coordination, decreased endurance, decreased mobility, difficulty walking, decreased strength, increased fascial restrictions, impaired flexibility, improper body mechanics, and postural dysfunction.   ACTIVITY LIMITATIONS: continence  PARTICIPATION LIMITATIONS: shopping and community activity  PERSONAL FACTORS: Time since onset of injury/illness/exacerbation are also affecting patient's functional outcome.   REHAB POTENTIAL: Good  CLINICAL DECISION MAKING: Stable/uncomplicated  EVALUATION COMPLEXITY: Low   GOALS: Goals reviewed with patient? Yes  SHORT TERM GOALS: Target date: 04/09/23  Pt to be I with HEP.  Baseline: Goal status: MET  2.  Pt to be I with voiding and breathing mechanics and abdominal massage for improved bowel habits.  Baseline:  Goal status: MET  3.  Pt to be I with urge drill and knack method to improve urinary leakage.  Baseline:  Goal status: MET   LONG TERM GOALS: Target date: 06/11/23  Pt to be I with advanced HEP.  Baseline:  Goal status: MET  2.  Pt to demonstrate at least 5/5 bil hip strength for improved  pelvic stability and functional squats without leakage.  Baseline:  Goal status: on going  3.  Pt to demonstrate improved coordination of pelvic floor and breathing mechanics with squat with appropriate synergistic patterns to decrease pain and leakage at least 75% of the time.    Baseline:  Goal status: on going  4.  Pt will report 3 BMs per week due to improved muscle tone and coordination with BMs.  Baseline:  Goal status: MET  5.  Pt will report her BMs are complete due to improved bowel habits and evacuation  techniques.  Baseline:  Goal status: on going  6.  Pt to demonstrate at least 3/5 pelvic floor strength and holds for at least 8s for improved pelvic stability and decreased strain at pelvic floor/ decrease leakage.  Baseline:  Goal status: pt has deferred internal pelvic floor assessment   PLAN:  PT FREQUENCY: every other week  PT DURATION:  6 sessions  PLANNED INTERVENTIONS: Therapeutic exercises, Therapeutic activity, Neuromuscular re-education, Balance training, Patient/Family education, Self Care, Joint mobilization, Dry Needling, Electrical stimulation, Spinal mobilization, Cryotherapy, Moist heat, scar mobilization, Biofeedback, and Manual therapy  PLAN FOR NEXT SESSION: on hold until follow up with referring provider Dr. Sallyanne Kuster - will reevaluation at that time.   Otelia Sergeant, PT, DPT 07/30/242:46 PM

## 2023-06-30 DIAGNOSIS — Z9849 Cataract extraction status, unspecified eye: Secondary | ICD-10-CM | POA: Diagnosis not present

## 2023-06-30 DIAGNOSIS — Z961 Presence of intraocular lens: Secondary | ICD-10-CM | POA: Diagnosis not present

## 2023-06-30 DIAGNOSIS — G43B0 Ophthalmoplegic migraine, not intractable: Secondary | ICD-10-CM | POA: Diagnosis not present

## 2023-06-30 DIAGNOSIS — H53143 Visual discomfort, bilateral: Secondary | ICD-10-CM | POA: Diagnosis not present

## 2023-07-01 ENCOUNTER — Ambulatory Visit (HOSPITAL_BASED_OUTPATIENT_CLINIC_OR_DEPARTMENT_OTHER): Payer: Medicare Other | Admitting: Cardiology

## 2023-07-01 ENCOUNTER — Encounter (HOSPITAL_BASED_OUTPATIENT_CLINIC_OR_DEPARTMENT_OTHER): Payer: Self-pay | Admitting: Cardiology

## 2023-07-01 VITALS — BP 114/68 | HR 67 | Ht <= 58 in | Wt 114.3 lb

## 2023-07-01 DIAGNOSIS — R0989 Other specified symptoms and signs involving the circulatory and respiratory systems: Secondary | ICD-10-CM | POA: Diagnosis not present

## 2023-07-01 DIAGNOSIS — E785 Hyperlipidemia, unspecified: Secondary | ICD-10-CM

## 2023-07-01 DIAGNOSIS — I2510 Atherosclerotic heart disease of native coronary artery without angina pectoris: Secondary | ICD-10-CM

## 2023-07-01 DIAGNOSIS — R5383 Other fatigue: Secondary | ICD-10-CM

## 2023-07-01 NOTE — Progress Notes (Signed)
Cardiology Office Note:  .   Date:  07/01/2023  ID:  CHRISTEEN Stein, DOB 06-07-1941, MRN [id number] PCP: Tresa Garter, MD  [facility] Providers Cardiologist:  Jodelle Red, MD {  History of Present Illness: [name]   [name] is a 82 y.o. female with a hx of HTN, CAD, HLD, paroxysmal atrial fibrillation (during hospitalization in 2017) who is seen for follow up today. I initially met her 07/19/2019 as a new patient to me/prior Dr. Okey Dupre for the evaluation and management of cardiovascular disease.   Today: Seen by Gillian Shields on 05/26/23.   Feels terrible now every morning, blood pressure as low as 110s systolic. Has not seen any lower numbers. Takes until about noon for her to feel better. BP in clinic today 114/68. Hasn't checked BP in the afternoon recently to know what she is running.  Reviewed her BP monitor results today. Taking 20 mg olmesartan at 10 am and 9 PM. AM blood pressures are before taking her medication. Eating makes her feel worse.  Typical day: wakes up around 8-9 AM, Usually doesn't eat breakfast, has a cup of tea (with caffeine). Takes olmesartan around 10 AM. Doesn't usually eat the rest of the morning as it makes her sick. Eats early lunch around 11:30, usually about 1/2 sandwich. Feels her best around noon. Does well in the afternoon. Dinner is 5:30-6, eats well. Feels well in the evening. Takes her PM meds about 9 PM.   ROS: Denies chest pain, shortness of breath at rest or with normal exertion. No PND, orthopnea, LE edema or unexpected weight gain. No syncope or palpitations. ROS otherwise negative except as noted.   Studies Reviewed: [name]    EKG:     not ordered today  Physical Exam:   VS:  BP 114/68 (BP Location: Left Arm, Patient Position: Sitting, Cuff Size: Normal)   Pulse 67   Ht 4\' 10"  (1.473 m)   Wt 114 lb 4.8 oz (51.8 kg)   SpO2 98%   BMI 23.89 kg/m    Wt Readings from Last 3 Encounters:  07/01/23 114 lb 4.8 oz (51.8 kg)   05/26/23 113 lb 8 oz (51.5 kg)  05/05/23 115 lb 3.2 oz (52.3 kg)    GEN: Well nourished, well developed in no acute distress HEENT: Normal, moist mucous membranes NECK: No JVD CARDIAC: regular rhythm, normal S1 and S2, no rubs or gallops. No murmur. VASCULAR: Radial and DP pulses 2+ bilaterally. No carotid bruits RESPIRATORY:  Clear to auscultation without rales, wheezing or rhonchi  ABDOMEN: Soft, non-tender, non-distended MUSCULOSKELETAL:  Ambulates independently SKIN: Warm and dry, no edema NEUROLOGIC:  Alert and oriented x 3. No focal neuro deficits noted. PSYCHIATRIC:  Normal affect    ASSESSMENT AND PLAN: .   nonobstructive CAD Hypercholesterolemia, LDL goal <70 -on cath 2018 -continue aspirin, atorvastatin -counseled on red flag warning signs that need immediate medical attention   Hypertension: labile -we have had multiple titrations. Changed to olmesartan from losartan, amlodipine stopped. Feels worse, but timing does not clearly line up with peak time of BP medication -ambulatory blood pressure monitor reviewed. Remains above goal -I am concerned that her morning fatigue is not related to her blood pressure. As she takes her AM olmesartan at 10 am and feels her best by noon, if this were related to the BP meds I'd expect her to feel at her worst at noon. For now, will trial taking olmesartan at lunch and bedtime. She will keep  a log of BP patterns so that we can better assess timing -limited options for meds. No beta blocker due to history of bradycardia. With intermittent low BP, would like to avoid thiazide/loop/MRA to avoid dehydration/orthostasis. Also has history of kidney stones.   Cardiac risk counseling and prevention recommendations: -recommend heart healthy/Mediterranean diet, with whole grains, fruits, vegetable, fish, lean meats, nuts, and olive oil. Limit salt. -recommend moderate walking, 3-5 times/week for 30-50 minutes each session. Aim for at least 150  minutes.week. Goal should be pace of 3 miles/hours, or walking 1.5 miles in 30 minutes -recommend avoidance of tobacco products. Avoid excess alcohol. -on aspirin 81 mg daily per personal preference  Dispo: 1 month with Gillian Shields or myself  Signed, Jodelle Red, MD   Jodelle Red, MD, PhD, [facility]  [facility]  [place]  [facility] at [facility] at [facility] [address] [place], [place] [address] [phone]

## 2023-07-01 NOTE — Patient Instructions (Addendum)
Try taking your olmesartan after lunch (around noon) and before bedtime. See if this helps you feel better in the morning.   Medication Instructions:  Continue same medications *If you need a refill on your cardiac medications before your next appointment, please call your pharmacy*   Lab Work: None ordered   Testing/Procedures: None ordered   Follow-Up: At [facility], you and your health needs are our priority.  As part of our continuing mission to provide you with exceptional heart care, we have created designated Provider Care Teams.  These Care Teams include your primary Cardiologist (physician) and Advanced Practice Providers (APPs -  Physician Assistants and Nurse Practitioners) who all work together to provide you with the care you need, when you need it.  We recommend signing up for the patient portal called "MyChart".  Sign up information is provided on this After Visit Summary.  MyChart is used to connect with patients for Virtual Visits (Telemedicine).  Patients are able to view lab/test results, encounter notes, upcoming appointments, etc.  Non-urgent messages can be sent to your provider as well.   To learn more about what you can do with MyChart, go to [email or web address].    Your next appointment:  1 month    Provider:  Gillian Shields NP

## 2023-07-03 ENCOUNTER — Encounter (INDEPENDENT_AMBULATORY_CARE_PROVIDER_SITE_OTHER): Payer: Self-pay

## 2023-07-23 ENCOUNTER — Ambulatory Visit: Payer: Medicare Other | Admitting: Internal Medicine

## 2023-07-31 ENCOUNTER — Ambulatory Visit (INDEPENDENT_AMBULATORY_CARE_PROVIDER_SITE_OTHER): Payer: Medicare Other | Admitting: Physician Assistant

## 2023-07-31 ENCOUNTER — Encounter: Payer: Self-pay | Admitting: Physician Assistant

## 2023-07-31 VITALS — BP 116/68 | HR 56 | Ht 59.0 in | Wt 116.0 lb

## 2023-07-31 DIAGNOSIS — K5909 Other constipation: Secondary | ICD-10-CM

## 2023-07-31 NOTE — Patient Instructions (Addendum)
Start taking Miralax 2 times daily one in the morning and one in the evening.  Start Benefiber 1 dose daily  You are schedule for a follow up on 11/07/23 at 2 pm   If your blood pressure at your visit was 140/90 or greater, please contact your primary care physician to follow up on this.  _______________________________________________________  If you are age 83 or older, your body mass index should be between 23-30. Your Body mass index is 23.43 kg/m. If this is out of the aforementioned range listed, please consider follow up with your Primary Care Provider.  If you are age 77 or younger, your body mass index should be between 19-25. Your Body mass index is 23.43 kg/m. If this is out of the aformentioned range listed, please consider follow up with your Primary Care Provider.   ________________________________________________________  The [facility] providers would like to encourage you to use [facility] to communicate with providers for non-urgent requests or questions.  Due to long hold times on the telephone, sending your provider a message by [facility] may be a faster and more efficient way to get a response.  Please allow 48 business hours for a response.  Please remember that this is for non-urgent requests.  _______________________________________________________   Thank you for entrusting me with your care and choosing [facility].  Hyacinth Meeker PA-C

## 2023-07-31 NOTE — Progress Notes (Signed)
Chief Complaint: Follow-up IBS  HPI:    [name] is an 82 year old female with a past medical history as listed below, known to Dr. Lavon Paganini, who presents to clinic today for follow-up of her IBS.    05/29/2018 colonoscopy was severe sigmoid diverticulosis and internal hemorrhoids and otherwise normal.  Negative for microscopic colitis.    08/10/2018 patient seen by Dr. Lavon Paganini and had started Zenpep and Colestid and was doing some better.    08/21/2021 patient seen in clinic for constipation.  At that time ordered 2 view abdominal x-ray and recommended patient start Linzess 72 mcg daily.    08/21/2021 abdominal x-ray with left nephrolithiasis and a large amount of stool throughout the colon.  She was told to do a bowel purge with MiraLAX and then start the Linzess 72 mcg daily.    01/04/2022 patient seen in clinic by me and at that time described that she had never done anything that we talked about at the previous visit as she was worried about urgency and lack of control of her bowel movements she never did a bowel purge and never started the Linzess.  She had been using MiraLAX which was not really helping.  She had not had a bowel movement in 8 days.  At that time ordered a stat abdominal x-ray for concern over obstruction.  Discussed that pending results we will go ahead and do a MiraLAX bowel purge and start Linzess as we discussed at previous visit.    01/10/2022 patient called and described that she had taken the 8 samples of Linzess and had no bowel movement.  She had some abdominal swelling and discomfort.  Her Linzess was increased 245 mcg daily and she was told to do another half of a MiraLAX purge.    01/11/2022 patient called back and described that she had an uncontrollable episode of diarrhea immediately after dinner.  She was told to stay on the Linzess 72 mcg.    01/21/2022 patient described an uncontrollable blowout 11 days prior on 2/23 she had continued Linzess 72 mcg daily since then  with no movement other than a few pebbles.  She describes some stomach pain and gurgling and nausea with cold sweats and shaking.  She felt "lousy".  Dr. Lavon Paganini was asked for her opinion and did not give any further recommendations.  Another x-ray was ordered to see if she was still constipated.    01/28/2022 patient had abdominal x-ray which showed nonobstructive nonspecific bowel gas pattern.    01/30/2022 office visit with me and described that the Linzess 72 mcg had started to work.  He still felt quite ill with nausea and some epigastric pain.  At that time reviewed recent x-ray of her abdomen which did not show constipation.  Recommend she start over-the-counter Omeprazole 20 mg daily for epigastric pain and nausea.    09/24/2022 patient was using MiraLAX every 3 days.  Also some right upper quadrant pain.  At that time discussed abdominal x-ray which she declined.  Gave her a trial of Amitiza 8 mcg with food twice daily.  Ordered right upper quadrant ultrasound.    02/26/2023 patient followed with Dr. Lavon Paganini regards to her IBS.  At that time discussed nausea, bloating and cold sweats.  Alternating constipation and diarrhea.  At time reported Amitiza and Linzess were unhelpful in managing her symptoms.  At that time discussed chronic irritable bowel syndrome with alternating diarrhea and constipation with borderline low fecal elastase.  She was told  to start drinking at least 4-5 bottles of water a day and increase fiber with Benefiber 1 tablespoon 2-3 times daily as well as MiraLAX and prune juice.  Advised her to take senna once every 2 to 3 days as needed.  Discussed as needed Imodium if she needed to go somewhere.  She was referred to pelvic floor physical therapy for biofeedback.    05/24/2023 patient discussed that the pelvic floor therapist recommended a follow-up.  She is having bowel movements 3 times a week.    05/27/2023 patient saw her PCP.  At that time having episodes of feeling shaky, sweaty  after morning meals.  All morning hypertensive meds were discontinued.  At that time recommended she stop drinking coffee first thing in the morning and replace with water and start MiraLAX daily.    07/01/2023 patient saw cardiology in regards to low blood pressure in the mornings which is making her feel bad.  At that time they changed the timing of her olmesartan.    Today, patient describes that she remains constipated and with the same issues.  Apparently she saw the pelvic floor therapist multiple times but had no change in her symptoms so they discontinued her care and told her to follow-up here.  Explains that she has tried Amitiza and Linzess which were too strong for her.  Currently using MiraLAX at half a dose for 3 days in a row until she has diarrhea which is sometimes urgent and uncontrollable and then the next day will have a soft solid stool and then starts her MiraLAX again.  She never started fiber as recommended because she was nervous it would make things worse.  She has tried occasional Imodium which also constipates her.    Currently following with cardiology in regards to hypotension.  Tells me that this always happens after she eats the first meal of the day no matter when she eats it.  She does tell me though that she sits down to breakfast and will take her olmesartan another blood pressure pills and then about 30 to 45 minutes later she feels these episodes of hypotension.    Denies fever, chills, weight loss or blood in her stool.     GI Hx: US Abdomen Limited RUQ 10-03-22  No cholelithiasis or sonographic evidence for acute cholecystitis.    DG Abd 2 view 01-29-22 1. Nonobstructive, nonspecific bowel gas pattern.    DG Abd 2 view 01-04-22 Nonobstructive bowel gas pattern.    Colonoscopy 06-03-18 - Decreased sphincter tone found on digital rectal exam. - Moderate diverticulosis in the sigmoid colon. There was narrowing of the colon in association with the diverticular  opening. There was evidence of diverticular spasm. - Non-bleeding internal hemorrhoids. - Normal mucosa in the entire examined colon. Biopsied.  Biopsies negative for microscopic colitis.   Colonoscopy 10-23-15 1. 9 mm polyp in the cecum at the appendiceal orifice, injected submucosal saline to lift the mucosa, was unable to completely lift the polyp, snare polypectomy was performed in piecemeal but felt there may be an invasive complement and it was incomplete resection 2. Mild diverticulosis was noted in the sigmoid colon 3. Small internal hemorrhoids  Past Medical History:  Diagnosis Date   Abdominal pain, unspecified site    ADVERSE REACTION TO MEDICATION 07/31/2009   ANEMIA-NOS 07/07/2007   Arthritis    Chest pain, unspecified    Chronic kidney disease    hx of kidney stone   COLONIC POLYPS, ADENOMATOUS, HX OF  Complication of anesthesia    Disturbance of skin sensation    DIVERTICULOSIS, COLON 02/23/2009   Family history of diabetes mellitus    Family history of malignant neoplasm of breast    FATIGUE 05/25/2009   Fever, unspecified    Heart murmur    hx of   HYPERLIPIDEMIA 07/07/2007   HYPERPARATHYROIDISM UNSPECIFIED 10/20/2007   HYPERTENSION 02/04/2008   Irritable bowel syndrome (IBS)    NEPHROLITHIASIS, HX OF 11/15/2008   OSTEOPOROSIS 01/01/2008   Other acquired absence of organ    parathyroidectomy   Pain in limb    PALPITATIONS, OCCASIONAL 07/12/2008   PARATHYROIDECTOMY 01/02/2009   PONV (postoperative nausea and vomiting)    SCOLIOSIS 05/25/2009   Seasonal allergies    Unspecified constipation    URINARY INCONTINENCE 07/07/2007   UTI'S, RECURRENT 11/20/2009   kimbrough     Past Surgical History:  Procedure Laterality Date   BUNIONECTOMY     CATARACT EXTRACTION, BILATERAL     COLONOSCOPY     EXTRACORPOREAL SHOCK WAVE LITHOTRIPSY Left 12/21/2020   Procedure: EXTRACORPOREAL SHOCK WAVE LITHOTRIPSY (ESWL);  Surgeon: Jerilee Field, MD;  Location: [facility];   Service: Urology;  Laterality: Left;   LAPAROSCOPIC APPENDECTOMY  01/19/2016   Procedure: APPENDECTOMY LAPAROSCOPIC with orifice of cecal polyp;  Surgeon: Romie Levee, MD;  Location: [facility];  Service: General;;   LEFT HEART CATH AND CORONARY ANGIOGRAPHY N/A 04/07/2017   Procedure: Left Heart Cath and Coronary Angiography;  Surgeon: [name], MD;  Location: [facility];  Service: Cardiovascular;  Laterality: N/A;   PARATHYROIDECTOMY     Rt Superior open neck exploration   POLYPECTOMY     RIGHT OOPHORECTOMY     TONSILLECTOMY      Current Outpatient Medications  Medication Sig Dispense Refill   aspirin EC 81 MG tablet Take 1 tablet (81 mg total) by mouth daily. 100 tablet 3   b complex vitamins capsule Take 1 capsule by mouth daily.     Cholecalciferol (VITAMIN D) 50 MCG (2000 UT) CAPS Take 1 capsule by mouth daily.     denosumab (PROLIA) 60 MG/ML SOSY injection Inject 60 mg into the skin every 6 (six) months.     Ibuprofen (ADVIL) 200 MG CAPS Take 1 capsule by mouth daily.     LORazepam (ATIVAN) 0.5 MG tablet Take 1 tablet (0.5 mg total) by mouth 2 (two) times daily as needed for anxiety. 30 tablet 3   magnesium oxide (MAG-OX) 400 (240 Mg) MG tablet Take 400 mg by mouth at bedtime.     olmesartan (BENICAR) 20 MG tablet Take 1 tablet (20 mg total) by mouth 2 (two) times daily. 180 tablet 3   No current facility-administered medications for this visit.    Allergies as of 07/31/2023 - Review Complete 07/01/2023  Allergen Reaction Noted   Anesthetics, amide Other (See Comments) 02/20/2016   Lisinopril Cough 10/05/2009    Family History  Problem Relation Age of Onset   Heart disease Mother        valve replacement   Sudden death Father 78   Heart disease Father 41       MI   Breast cancer Paternal Aunt    Diabetes Other        1st degree relative   Colon cancer Neg Hx    Stomach cancer Neg Hx    Esophageal cancer Neg Hx    Pancreatic cancer Neg Hx     Social  History   Socioeconomic  History   Marital status: Widowed    Spouse name: Not on file   Number of children: 3   Years of education: Not on file   Highest education level: Not on file  Occupational History   Occupation: travel agent    Employer: RETIRED  Tobacco Use   Smoking status: Never   Smokeless tobacco: Never  Vaping Use   Vaping status: Never Used  Substance and Sexual Activity   Alcohol use: Yes    Comment: 1 glass of wine per day   Drug use: No   Sexual activity: Never  Other Topics Concern   Not on file  Social History Narrative       2-3 c coffee in am    ocass wine    G3P2vaginal delivery   Pt cell [phone]      Patient walks daily for exercise.    Social Determinants of Health   Financial Resource Strain: Low Risk  (03/17/2023)   Overall Financial Resource Strain (CARDIA)    Difficulty of Paying Living Expenses: Not very hard  Food Insecurity: No Food Insecurity (03/17/2023)   Hunger Vital Sign    Worried About Running Out of Food in the Last Year: Never true    Ran Out of Food in the Last Year: Never true  Transportation Needs: No Transportation Needs (03/17/2023)   PRAPARE - Administrator, Civil Service (Medical): No    Lack of Transportation (Non-Medical): No  Physical Activity: Insufficiently Active (03/17/2023)   Exercise Vital Sign    Days of Exercise per Week: 1 day    Minutes of Exercise per Session: 20 min  Stress: Stress Concern Present (03/17/2023)   [employer] of Occupational Health - Occupational Stress Questionnaire    Feeling of Stress : To some extent  Social Connections: Moderately Integrated (03/17/2023)   Social Connection and Isolation Panel [NHANES]    Frequency of Communication with Friends and Family: More than three times a week    Frequency of Social Gatherings with Friends and Family: More than three times a week    Attends Religious Services: More than 4 times per year    Active Member of [employer] or  Organizations: Yes    Attends Banker Meetings: More than 4 times per year    Marital Status: Widowed  Intimate Partner Violence: Not At Risk (03/17/2023)   Humiliation, Afraid, Rape, and Kick questionnaire    Fear of Current or Ex-Partner: No    Emotionally Abused: No    Physically Abused: No    Sexually Abused: No    Review of Systems:    Constitutional: No weight loss, fever or chills Cardiovascular: No chest pain Respiratory: No SOB  Gastrointestinal: See HPI and otherwise negative   Physical Exam:  Vital signs: BP 116/68   Pulse (!) 56   Ht 4\' 11"  (1.499 m)   Wt 116 lb (52.6 kg)   BMI 23.43 kg/m    Constitutional:   Pleasant Elderly Caucasian female appears to be in NAD, Well developed, Well nourished, alert and cooperative Respiratory: Respirations even and unlabored. Lungs clear to auscultation bilaterally.   No wheezes, crackles, or rhonchi.  Cardiovascular: Normal S1, S2. No MRG. Regular rate and rhythm. No peripheral edema, cyanosis or pallor.  Gastrointestinal:  Soft, nondistended, nontender. No rebound or guarding. Normal bowel sounds. No appreciable masses or hepatomegaly. Rectal:  Not performed.  Psychiatric:  Demonstrates good judgement and reason without abnormal affect or behaviors.  RELEVANT  LABS AND IMAGING: CBC    Component Value Date/Time   WBC 6.3 04/19/2022 1115   WBC 7.0 04/05/2021 1712   RBC 4.31 04/19/2022 1115   RBC 4.02 04/05/2021 1712   HGB 13.8 04/19/2022 1115   HCT 40.6 04/19/2022 1115   PLT 172 04/19/2022 1115   MCV 94 04/19/2022 1115   MCH 32.0 04/19/2022 1115   MCH 31.8 04/05/2021 1712   MCHC 34.0 04/19/2022 1115   MCHC 33.4 04/05/2021 1712   RDW 12.2 04/19/2022 1115   LYMPHSABS 0.4 (L) 12/18/2017 0509   LYMPHSABS 1.4 04/01/2017 1001   MONOABS 0.5 12/18/2017 0509   EOSABS 0.0 12/18/2017 0509   EOSABS 0.1 04/01/2017 1001   BASOSABS 0.0 12/18/2017 0509   BASOSABS 0.1 04/01/2017 1001    CMP     Component Value  Date/Time   NA 142 03/18/2023 1544   NA 141 04/19/2022 1115   K 4.1 03/18/2023 1544   CL 108 03/18/2023 1544   CO2 26 03/18/2023 1544   GLUCOSE 103 (H) 03/18/2023 1544   BUN 18 03/18/2023 1544   BUN 16 04/19/2022 1115   CREATININE 0.81 03/18/2023 1544   CREATININE 0.64 01/25/2015 1112   CALCIUM 9.3 03/18/2023 1544   CALCIUM 9.2 09/07/2010 2148   PROT 6.5 03/18/2023 1544   ALBUMIN 4.0 03/18/2023 1544   AST 19 03/18/2023 1544   ALT 12 03/18/2023 1544   ALKPHOS 59 03/18/2023 1544   BILITOT 0.8 03/18/2023 1544   GFRNONAA >60 04/05/2021 1712   GFRAA >60 12/18/2017 0509    Assessment: 1.  IBS-mixed: Has been running more constipated with what sounds like overflow constipation every 3 days, in between no bowel movement at all, previously tried and Linzess and Amitiza, these did not work for her  Plan: 1.  At this point believe she is having some overflow incontinence every 3 days.  I would like her to increase her MiraLAX.  Currently taking a half a dose for 3 days in a row and then skipping a day or 2 and then starting back.  I would like her to take half a dose twice a day. 2.  Also recommend that she start Benefiber 1 dose in the morning, discussed that this can help bulk the stool. 3.  As far as hypotension I do not think this is GI related.  If it were it would happen after every meal.  Will allow cardiology to continue medication manipulation. 4.  Patient to follow in clinic with me in 3 months.  If she has problems before then she will let us know.  Hyacinth Meeker, PA-C [facility] 07/31/2023, 3:09 PM  Cc: Tresa Garter, MD

## 2023-08-01 ENCOUNTER — Ambulatory Visit (INDEPENDENT_AMBULATORY_CARE_PROVIDER_SITE_OTHER): Payer: Medicare Other | Admitting: Family

## 2023-08-01 ENCOUNTER — Encounter (HOSPITAL_BASED_OUTPATIENT_CLINIC_OR_DEPARTMENT_OTHER): Payer: Self-pay | Admitting: Family

## 2023-08-01 VITALS — BP 128/68 | HR 64 | Ht 59.0 in | Wt 117.0 lb

## 2023-08-01 DIAGNOSIS — R0989 Other specified symptoms and signs involving the circulatory and respiratory systems: Secondary | ICD-10-CM

## 2023-08-01 DIAGNOSIS — I872 Venous insufficiency (chronic) (peripheral): Secondary | ICD-10-CM | POA: Diagnosis not present

## 2023-08-01 DIAGNOSIS — F411 Generalized anxiety disorder: Secondary | ICD-10-CM

## 2023-08-01 NOTE — Patient Instructions (Signed)
Medication Instructions:  TAKE YOUR OLMESARTAN IN THE MORNING AND THE EVENING   *If you need a refill on your cardiac medications before your next appointment, please call your pharmacy*  Lab Work: NONE  Testing/Procedures: NONE  Follow-Up: IN NOVEMBER AS SCHEDULED  Other Instructions Exercises to do While Sitting  Warm-up Before starting other exercises: Sit up as straight as you can. Have your knees bent at 90 degrees, which is the shape of the capital letter "L." Keep your feet flat on the floor. Sit at the front edge of your chair, if you can. Pull in (tighten) the muscles in your abdomen and stretch your spine and neck as straight as you can. Hold this position for a few minutes. Breathe in and out evenly. Try to concentrate on your breathing, and relax your mind. Stretching Exercise A: Arm stretch Hold your arms out straight in front of your body. Bend your hands at the wrist with your fingers pointing up, as if signaling someone to stop. Notice the slight tension in your forearms as you hold the position. Keeping your arms out and your hands bent, rotate your hands outward as far as you can and hold this stretch. Aim to have your thumbs pointing up and your pinkie fingers pointing down. Slowly repeat arm stretches for one minute as tolerated. Exercise B: Leg stretch If you can move your legs, try to "draw" letters on the floor with the toes of your foot. Write your name with one foot. Write your name with the toes of your other foot. Slowly repeat the movements for one minute as tolerated. Exercise C: Reach for the sky Reach your hands as far over your head as you can to stretch your spine. Move your hands and arms as if you are climbing a rope. Slowly repeat the movements for one minute as tolerated. Range of motion exercises Exercise A: Shoulder roll Let your arms hang loosely at your sides. Lift just your shoulders up toward your ears, then let them relax back  down. When your shoulders feel loose, rotate your shoulders in backward and forward circles. Do shoulder rolls slowly for one minute as tolerated. Exercise B: March in place As if you are marching, pump your arms and lift your legs up and down. Lift your knees as high as you can. If you are unable to lift your knees, just pump your arms and move your ankles and feet up and down. March in place for one minute as tolerated. Exercise C: Seated jumping jacks Let your arms hang down straight. Keeping your arms straight, lift them up over your head. Aim to point your fingers to the ceiling. While you lift your arms, straighten your legs and slide your heels along the floor to your sides, as wide as you can. As you bring your arms back down to your sides, slide your legs back together. If you are unable to use your legs, just move your arms. Slowly repeat seated jumping jacks for one minute as tolerated. Strengthening exercises Exercise A: Shoulder squeeze Hold your arms straight out from your body to your sides, with your elbows bent and your fists pointed at the ceiling. Keeping your arms in the bent position, move them forward so your elbows and forearms meet in front of your face. Open your arms back out as wide as you can with your elbows still bent, until you feel your shoulder blades squeezing together. Hold for 5 seconds. Slowly repeat the movements forward and backward for  one minute as tolerated. Contact a health care provider if: You have to stop exercising due to any of the following: Pain. Nausea. Shortness of breath. Dizziness. Fatigue. You have significant pain or soreness after exercising. Get help right away if: You have chest pain. You have difficulty breathing. These symptoms may represent a serious problem that is an emergency. Do not wait to see if the symptoms will go away. Get medical help right away. Call your local emergency services (911 in the [place] Do not drive  yourself to the hospital. Summary Exercises that you do while sitting (chair exercises) can strengthen your heart, burn calories, and keep muscles and joints healthy. You may benefit from chair exercises if you are unable to do standing exercises due to diabetic foot pain, obesity, recovery from surgery or injury, or other conditions. Before starting chair exercises, check with your health care provider or a physical therapist to find out how much exercise you can tolerate and which exercises are safe for you. This information is not intended to replace advice given to you by your health care provider. Make sure you discuss any questions you have with your health care provider. Document Revised: 12/31/2020 Document Reviewed: 12/31/2020 Elsevier Patient Education  2024 [employer].

## 2023-08-01 NOTE — Progress Notes (Unsigned)
Cardiology Office Note:  .   Date:  08/01/2023  ID:  [name], DOB 1941/01/06, MRN [id number] PCP: Teresa Garter, MD  [facility] Providers Cardiologist:  Teresa Red, MD { Click to update primary MD,subspecialty MD or APP then REFRESH:1}   History of Present Illness: [name] Kitchen   [name] is a 82 y.o. female  hx of HTN, CAD (nonobstructive by cath 2018), HLD, PAF during hospitalization 2017   Prior patient of Dr. Okey Dupre.  Established with Dr. Cristal Deer 07/19/2019.  Longstanding history of labile blood pressure and orthostatic hypotension.  Previously has been told to continue losartan 25 mg twice daily and hold if BP less than 110.  Amlodipine also used previously.  At visit 04/15/2023 she was recommended to try taking her initial dose of losartan at lunchtime to see if it helped with her lightheaded spells.   Seen 05/05/23 with BP 186/82 and labile home readings 100/70, 204/120, 158/73. She was taking Losartan half tablet BID and was no longer taking Amlodipine. She was transitioned to Olmesartan 20mg  at bedtime and 24h BP cuff ordered. Via phone call 05/15/23 it was increased to 20mg  BID.    She was seen 05/26/23 with initial BP 144/78 with repeat 132/72. Average home BP 136/83. Olmesartan 20mg  BID was continued.   At follow up 07/01/23 with Dr. Cristal Deer noted morning fatigue. She was recommended to move Olemsartan to lunch and bedtime.   Presents today for follow-up independently. Pleasant lady who resides at [facility]. BP yesterday at GI 116/68. Her first BP was 156/78, repeat 128/68. She notices no difference in how she is feeling with changing the time that she takes olmesartan to lunchtime. After her small breakfast, she feels bad all day until after she eats dinner. She monitors her blood pressures at home and they are still fluctuating. She is concerned about left lower extremity edema. She has been taking lorazepam nearly everyday and notes that it helps her to  feel better. She is feeling depressed, and inquires about if exercise could help her. Reports no shortness of breath nor dyspnea on exertion. Reports no chest pain, pressure, or tightness. No edema, orthopnea, PND. Reports no palpitations.   ROS: Please see the history of present illness.    All other systems reviewed and are negative.   Studies Reviewed: .        Cardiac Studies & Procedures   CARDIAC CATHETERIZATION  CARDIAC CATHETERIZATION 04/07/2017  Narrative Conclusions: 1. Minimal coronary artery disease involving the large epicardial coronary arteries. Mild to moderate stenosis of diagonal branches is evident. 2. Normal left ventricular contraction. 3. Normal left ventricular systolic function. 4. Small right radial artery.  Recommendations: 1. Continue medical therapy and risk factor modification.  Teresa Kendall, MD [facility] Pager: [phone]  Findings Coronary Findings Diagnostic  Dominance: Right  Left Main Vessel is large. Vessel is angiographically normal.  Left Anterior Descending Vessel is large. The vessel exhibits minimal luminal irregularities.  First Diagonal Branch Vessel is moderate in size.  Second Diagonal Branch Vessel is small in size.  Third Diagonal Branch Vessel is small in size.  Left Circumflex Vessel is large. Vessel is angiographically normal.  First Obtuse Marginal Branch Vessel is small in size.  Second Obtuse Marginal Branch Vessel is moderate in size.  Third Obtuse Marginal Branch Vessel is moderate in size.  Right Coronary Artery Vessel is large. Vessel is angiographically normal.  Right Posterior Descending Artery Vessel is moderate in size.  Right Posterior Atrioventricular  Artery Vessel is moderate in size.  Intervention  No interventions have been documented.   STRESS TESTS  MYOCARDIAL PERFUSION IMAGING 03/24/2017  Narrative  Nuclear stress EF: 67%.  There was no ST segment deviation noted  during stress.  There is a small defect of mild severity present in the basal inferoseptal and mid inferoseptal location. The defect is partially reversible and could represent variations in diaphragmatic attenuation vs. ischemia.  There is a small defect of moderate severity present in the apex location. The defect is reversible and partially reversible.  There is a medium defect of moderate severity present in the basal anteroseptal, mid anteroseptal and apical septal location. The defect is reversible and worrisome for ischemia.  The left ventricular ejection fraction is hyperdynamic (>65%).  This is a high risk study.   ECHOCARDIOGRAM  ECHOCARDIOGRAM COMPLETE 04/17/2021  Narrative ECHOCARDIOGRAM REPORT    Patient Name:   [name] Date of Exam: 04/17/2021 Medical Rec #:  [id number]      Height:       59.0 in Accession #:    [id number]     Weight:       111.0 lb Date of Birth:  Jun 29, 1941      BSA:          1.436 m Patient Age:    80 years       BP:           144/86 mmHg Patient Gender: F              HR:           62 bpm. Exam Location:  [address]  Procedure: 2D Echo, Cardiac Doppler and Color Doppler  Indications:    I10 Hypertension  History:        Patient has prior history of Echocardiogram examinations, most recent 01/22/2016. Risk Factors:Hypertension and Dyslipidemia. Chronic kidney disease. Paroxysmal atrial fibrillation. Fatigue. Scoliosis. Murmur. Chest pain.  Sonographer:    Daphine Stein RDCS Referring Phys: [id number] [name]  IMPRESSIONS   1. Left ventricular ejection fraction, by estimation, is 60 to 65%. The left ventricle has normal function. The left ventricle has no regional wall motion abnormalities. There is severe asymmetric left ventricular hypertrophy of the basal-septal segment. Left ventricular diastolic parameters are consistent with Grade I diastolic dysfunction (impaired relaxation). 2. Right ventricular systolic  function is normal. The right ventricular size is normal. There is normal pulmonary artery systolic pressure. The estimated right ventricular systolic pressure is 32.4 mmHg. 3. Left atrial size was mildly dilated. 4. The mitral valve is abnormal. Trivial mitral valve regurgitation. 5. The aortic valve is tricuspid. Aortic valve regurgitation is mild. Mild to moderate aortic valve sclerosis/calcification is present, without any evidence of aortic stenosis. Aortic regurgitation PHT measures 517 msec. 6. The inferior vena cava is dilated in size with >50% respiratory variability, suggesting right atrial pressure of 8 mmHg.  Comparison(s): Changes from prior study are noted. 01/22/2016: LVEF 55-60%, mild LAE, RVSP 49 mmHg.  FINDINGS Left Ventricle: Left ventricular ejection fraction, by estimation, is 60 to 65%. The left ventricle has normal function. The left ventricle has no regional wall motion abnormalities. The left ventricular internal cavity size was normal in size. There is severe asymmetric left ventricular hypertrophy of the basal-septal segment. Left ventricular diastolic parameters are consistent with Grade I diastolic dysfunction (impaired relaxation). Indeterminate filling pressures.  Right Ventricle: The right ventricular size is normal. No increase in right ventricular wall thickness. Right ventricular systolic function  is normal. There is normal pulmonary artery systolic pressure. The tricuspid regurgitant velocity is 2.47 m/s, and with an assumed right atrial pressure of 8 mmHg, the estimated right ventricular systolic pressure is 32.4 mmHg.  Left Atrium: Left atrial size was mildly dilated.  Right Atrium: Right atrial size was normal in size.  Pericardium: There is no evidence of pericardial effusion.  Mitral Valve: The mitral valve is abnormal. There is mild thickening of the mitral valve leaflet(s). Trivial mitral valve regurgitation.  Tricuspid Valve: The tricuspid valve is  grossly normal. Tricuspid valve regurgitation is mild.  Aortic Valve: The aortic valve is tricuspid. Aortic valve regurgitation is mild. Aortic regurgitation PHT measures 517 msec. Mild to moderate aortic valve sclerosis/calcification is present, without any evidence of aortic stenosis.  Pulmonic Valve: The pulmonic valve was grossly normal. Pulmonic valve regurgitation is trivial.  Aorta: The aortic root and ascending aorta are structurally normal, with no evidence of dilitation.  Venous: The inferior vena cava is dilated in size with greater than 50% respiratory variability, suggesting right atrial pressure of 8 mmHg.  IAS/Shunts: No atrial level shunt detected by color flow Doppler.   LEFT VENTRICLE PLAX 2D LVIDd:         2.90 cm  Diastology LVIDs:         1.90 cm  LV e' medial:    4.57 cm/s LV PW:         1.30 cm  LV E/e' medial:  14.8 LV IVS:        1.60 cm  LV e' lateral:   6.74 cm/s LVOT diam:     2.10 cm  LV E/e' lateral: 10.0 LV SV:         78 LV SV Index:   55 LVOT Area:     3.46 cm   RIGHT VENTRICLE             IVC RV Basal diam:  3.60 cm     IVC diam: 2.10 cm RV S prime:     16.00 cm/s TAPSE (M-mode): 2.0 cm  LEFT ATRIUM             Index       RIGHT ATRIUM           Index LA diam:        3.70 cm 2.58 cm/m  RA Area:     11.40 cm LA Vol (A2C):   38.4 ml 26.74 ml/m RA Volume:   27.30 ml  19.01 ml/m LA Vol (A4C):   31.1 ml 21.66 ml/m LA Biplane Vol: 36.3 ml 25.28 ml/m AORTIC VALVE LVOT Vmax:   108.50 cm/s LVOT Vmean:  66.400 cm/s LVOT VTI:    0.226 m AI PHT:      517 msec  AORTA Ao Root diam: 3.50 cm Ao Asc diam:  3.90 cm  MITRAL VALVE               TRICUSPID VALVE MV Area (PHT): 3.03 cm    TR Peak grad:   24.4 mmHg MV Decel Time: 250 msec    TR Vmax:        247.00 cm/s MV E velocity: 67.70 cm/s MV A velocity: 67.70 cm/s  SHUNTS MV E/A ratio:  1.00        Systemic VTI:  0.23 m Systemic Diam: 2.10 cm  Zoila Shutter MD Electronically signed by  Zoila Shutter MD Signature Date/Time: 04/17/2021/1:55:32 PM    Final    MONITORS  CARDIAC EVENT MONITOR  01/23/2016  Narrative NSR with PACs.  No atrial fibrillation.           Risk Assessment/Calculations:             Physical Exam:   VS:  BP 128/68   Pulse 64   Ht 4\' 11"  (1.499 m)   Wt 117 lb (53.1 kg)   BMI 23.63 kg/m    Wt Readings from Last 3 Encounters:  08/01/23 117 lb (53.1 kg)  07/31/23 116 lb (52.6 kg)  07/01/23 114 lb 4.8 oz (51.8 kg)    GEN: Well nourished, well developed in no acute distress NECK: No JVD; No carotid bruits CARDIAC: RRR, no murmurs, rubs, gallops RESPIRATORY:  Clear to auscultation without rales, wheezing or rhonchi  ABDOMEN: Soft, non-tender, non-distended EXTREMITIES:  No edema; No deformity   ASSESSMENT AND PLAN: .    Labile hypertension - Her blood pressure is notoriously labile. Do not feel that her symptoms are related to this. Will continue taking olmesartan, but will take in the morning and the evening as before. Encouraged an exercise schedule.   Depression - She expresses concerns about not being interested in doing much. She has an appointment with her PCP on 9/16. Encouraged to discuss starting a longer-acting medication to help with mood such as an SSRI.   Venous insufficiency - She has very mild nonpitting edema in her bilateral lower extremities. She was educated about propping her feet up and wearing compression stockings.       Dispo: She will follow up in the clinic as previously scheduled with Dr. Cristal Deer in November.  Signed, Alver Sorrow, NP

## 2023-08-04 ENCOUNTER — Encounter: Payer: Self-pay | Admitting: Internal Medicine

## 2023-08-04 ENCOUNTER — Ambulatory Visit (INDEPENDENT_AMBULATORY_CARE_PROVIDER_SITE_OTHER): Payer: Medicare Other | Admitting: Internal Medicine

## 2023-08-04 VITALS — BP 130/80 | HR 57 | Temp 98.6°F | Ht 59.0 in | Wt 114.0 lb

## 2023-08-04 DIAGNOSIS — R2681 Unsteadiness on feet: Secondary | ICD-10-CM

## 2023-08-04 DIAGNOSIS — K5901 Slow transit constipation: Secondary | ICD-10-CM | POA: Diagnosis not present

## 2023-08-04 DIAGNOSIS — R0989 Other specified symptoms and signs involving the circulatory and respiratory systems: Secondary | ICD-10-CM

## 2023-08-04 DIAGNOSIS — I10 Essential (primary) hypertension: Secondary | ICD-10-CM

## 2023-08-04 DIAGNOSIS — Z23 Encounter for immunization: Secondary | ICD-10-CM | POA: Diagnosis not present

## 2023-08-04 DIAGNOSIS — R0602 Shortness of breath: Secondary | ICD-10-CM

## 2023-08-04 NOTE — Assessment & Plan Note (Signed)
The patient was advised : try to take more laxative (MiraLAX) every 3 days to clean the bowels.  The rest of the time do not take stool softener/laxative so that you can take part in the activities, i.e. theater, [employer] etc.

## 2023-08-04 NOTE — Progress Notes (Unsigned)
Subjective:  Patient ID: [name], female    DOB: 02/28/1941  Age: 82 y.o. MRN: [id number]  CC: Follow-up (3 mnth f/u)   HPI EMIL SAINT presents for labile HTN Lorazepam is the only thing that brings BP down  Outpatient Medications Prior to Visit  Medication Sig Dispense Refill   b complex vitamins capsule Take 1 capsule by mouth daily.     Cholecalciferol (VITAMIN D) 50 MCG (2000 UT) CAPS Take 1 capsule by mouth daily.     denosumab (PROLIA) 60 MG/ML SOSY injection Inject 60 mg into the skin every 6 (six) months.     Ibuprofen (ADVIL) 200 MG CAPS Take 1 capsule by mouth daily.     LORazepam (ATIVAN) 0.5 MG tablet Take 1 tablet (0.5 mg total) by mouth 2 (two) times daily as needed for anxiety. 30 tablet 3   magnesium oxide (MAG-OX) 400 (240 Mg) MG tablet Take 400 mg by mouth at bedtime.     olmesartan (BENICAR) 20 MG tablet Take 1 tablet (20 mg total) by mouth 2 (two) times daily. 180 tablet 3   No facility-administered medications prior to visit.    ROS: Review of Systems  Constitutional:  Positive for fatigue. Negative for activity change, appetite change, chills and unexpected weight change.  HENT:  Negative for congestion, mouth sores and sinus pressure.   Eyes:  Negative for visual disturbance.  Respiratory:  Negative for cough and chest tightness.   Gastrointestinal:  Negative for abdominal pain and nausea.  Genitourinary:  Negative for difficulty urinating, frequency and vaginal pain.  Musculoskeletal:  Negative for back pain and gait problem.  Skin:  Negative for pallor and rash.  Neurological:  Negative for dizziness, tremors, weakness, numbness and headaches.  Psychiatric/Behavioral:  Negative for confusion, dysphoric mood and sleep disturbance. The patient is nervous/anxious.     Objective:  BP 130/80 (BP Location: Left Arm, Patient Position: Sitting, Cuff Size: Large)   Pulse (!) 57   Temp 98.6 F (37 C) (Oral)   Ht 4\' 11"  (1.499 m)   Wt 114 lb (51.7  kg)   SpO2 95%   BMI 23.03 kg/m   BP Readings from Last 3 Encounters:  08/04/23 130/80  08/01/23 128/68  07/31/23 116/68    Wt Readings from Last 3 Encounters:  08/04/23 114 lb (51.7 kg)  08/01/23 117 lb (53.1 kg)  07/31/23 116 lb (52.6 kg)    Physical Exam Constitutional:      General: She is not in acute distress.    Appearance: She is well-developed.  HENT:     Head: Normocephalic.     Right Ear: External ear normal.     Left Ear: External ear normal.     Nose: Nose normal.  Eyes:     General:        Right eye: No discharge.        Left eye: No discharge.     Conjunctiva/sclera: Conjunctivae normal.     Pupils: Pupils are equal, round, and reactive to light.  Neck:     Thyroid: No thyromegaly.     Vascular: No JVD.     Trachea: No tracheal deviation.  Cardiovascular:     Rate and Rhythm: Normal rate and regular rhythm.     Heart sounds: Normal heart sounds.  Pulmonary:     Effort: No respiratory distress.     Breath sounds: No stridor. No wheezing.  Abdominal:     General: Bowel sounds are normal. There  is no distension.     Palpations: Abdomen is soft. There is no mass.     Tenderness: There is no abdominal tenderness. There is no guarding or rebound.  Musculoskeletal:        General: Tenderness present.     Cervical back: Normal range of motion and neck supple. No rigidity.     Right lower leg: No edema.     Left lower leg: No edema.  Lymphadenopathy:     Cervical: No cervical adenopathy.  Skin:    Findings: No erythema or rash.  Neurological:     Cranial Nerves: No cranial nerve deficit.     Motor: No abnormal muscle tone.     Coordination: Coordination normal.     Deep Tendon Reflexes: Reflexes normal.  Psychiatric:        Behavior: Behavior normal.        Thought Content: Thought content normal.        Judgment: Judgment normal.     Lab Results  Component Value Date   WBC 6.3 04/19/2022   HGB 13.8 04/19/2022   HCT 40.6 04/19/2022   PLT  172 04/19/2022   GLUCOSE 103 (H) 03/18/2023   CHOL 132 01/25/2015   TRIG 64 01/25/2015   HDL 68 01/25/2015   LDLDIRECT 156.3 03/28/2011   LDLCALC 51 01/25/2015   ALT 12 03/18/2023   AST 19 03/18/2023   NA 142 03/18/2023   K 4.1 03/18/2023   CL 108 03/18/2023   CREATININE 0.81 03/18/2023   BUN 18 03/18/2023   CO2 26 03/18/2023   TSH 1.20 03/18/2023   INR 1.0 04/01/2017    CT HEAD WO CONTRAST ( )  Result Date: 03/19/2023 CLINICAL DATA:  Ataxia, head trauma Ataxia, chronic, progressive (Ped 0-17y) R weakness x 3 wks, fell once. EXAM: CT HEAD WITHOUT CONTRAST TECHNIQUE: Contiguous axial images were obtained from the base of the skull through the vertex without intravenous contrast. RADIATION DOSE REDUCTION: This exam was performed according to the departmental dose-optimization program which includes automated exposure control, adjustment of the mA and/or kV according to patient size and/or use of iterative reconstruction technique. COMPARISON:  12/15/2020 FINDINGS: Brain: No acute intracranial abnormality. Specifically, no hemorrhage, hydrocephalus, mass lesion, acute infarction, or significant intracranial injury. Mild age related volume loss. Vascular: No hyperdense vessel or unexpected calcification. Skull: No acute calvarial abnormality. Sinuses/Orbits: No acute findings Other: None IMPRESSION: No acute intracranial abnormality. Electronically Signed   By: Charlett Nose M.D.   On: 03/19/2023 16:23    Assessment & Plan:   Problem List Items Addressed This Visit     Essential hypertension (Chronic)    Lorazepam is the only thing that brings BP down when it is up      Constipation    Miralax 2 days/week  Patient is feeling better.  She thinks that her symptoms (weakness, dizziness, sweating) are related to her chronic constipation.  In the past she may have had a bowel movement once every 7 days.  Now, on MiraLAX she is having a bowel movement every 2-3 days. Try Dulcolax,  Metamucil, laxative tea with MIRALAX to have a BM more often      Other Visit Diagnoses     Need for influenza vaccination    -  Primary   Relevant Orders   Flu Vaccine Trivalent High Dose (Fluad) (Completed)         No orders of the defined types were placed in this encounter.     Follow-up: Return  in about 3 months (around 11/03/2023).  Sonda Primes, MD

## 2023-08-04 NOTE — Assessment & Plan Note (Signed)
Lorazepam is the only thing that brings BP down when it is up

## 2023-08-05 NOTE — Assessment & Plan Note (Signed)
Try to walk more use a walker.

## 2023-08-05 NOTE — Assessment & Plan Note (Signed)
Chronic fatigue.  Multifactorial.  Discussed.

## 2023-08-05 NOTE — Assessment & Plan Note (Signed)
Lorazepam is the only thing that brings BP down -no side effects if she takes 1/2 tablet.  If she is taking a full tablet, she has to take a nap sometimes.

## 2023-08-05 NOTE — Assessment & Plan Note (Signed)
Try to walk more, use a walker.

## 2023-08-19 ENCOUNTER — Ambulatory Visit: Payer: Medicare Other | Attending: Gastroenterology | Admitting: Physical Therapy

## 2023-08-19 DIAGNOSIS — R279 Unspecified lack of coordination: Secondary | ICD-10-CM | POA: Diagnosis not present

## 2023-08-19 DIAGNOSIS — R2689 Other abnormalities of gait and mobility: Secondary | ICD-10-CM | POA: Insufficient documentation

## 2023-08-19 DIAGNOSIS — R293 Abnormal posture: Secondary | ICD-10-CM | POA: Insufficient documentation

## 2023-08-19 DIAGNOSIS — M6281 Muscle weakness (generalized): Secondary | ICD-10-CM | POA: Insufficient documentation

## 2023-08-19 NOTE — Therapy (Signed)
OUTPATIENT PHYSICAL THERAPY FEMALE PELVIC EVALUATION   Patient Name: [name] MRN: [id number] DOB:02/29/1941, 82 y.o., female Today's Date: 08/19/2023  END OF SESSION:  PT End of Session - 08/19/23 1538     Visit Number 7    Date for PT Re-Evaluation 11/19/23    Authorization Type Medicare    Progress Note Due on Visit 10    PT Start Time 1532    PT Stop Time 1610    PT Time Calculation (min) 38 min    Activity Tolerance Patient tolerated treatment well    Behavior During Therapy WFL for tasks assessed/performed               Past Medical History:  Diagnosis Date   Abdominal pain, unspecified site    ADVERSE REACTION TO MEDICATION 07/31/2009   ANEMIA-NOS 07/07/2007   Arthritis    Chest pain, unspecified    Chronic kidney disease    hx of kidney stone   COLONIC POLYPS, ADENOMATOUS, HX OF    Complication of anesthesia    Disturbance of skin sensation    DIVERTICULOSIS, COLON 02/23/2009   Family history of diabetes mellitus    Family history of malignant neoplasm of breast    FATIGUE 05/25/2009   Fever, unspecified    Heart murmur    hx of   HYPERLIPIDEMIA 07/07/2007   HYPERPARATHYROIDISM UNSPECIFIED 10/20/2007   HYPERTENSION 02/04/2008   Irritable bowel syndrome (IBS)    NEPHROLITHIASIS, HX OF 11/15/2008   OSTEOPOROSIS 01/01/2008   Other acquired absence of organ    parathyroidectomy   Pain in limb    PALPITATIONS, OCCASIONAL 07/12/2008   PARATHYROIDECTOMY 01/02/2009   PONV (postoperative nausea and vomiting)    SCOLIOSIS 05/25/2009   Seasonal allergies    Unspecified constipation    URINARY INCONTINENCE 07/07/2007   UTI'S, RECURRENT 11/20/2009   kimbrough    Past Surgical History:  Procedure Laterality Date   BUNIONECTOMY     CATARACT EXTRACTION, BILATERAL     COLONOSCOPY     EXTRACORPOREAL SHOCK WAVE LITHOTRIPSY Left 12/21/2020   Procedure: EXTRACORPOREAL SHOCK WAVE LITHOTRIPSY (ESWL);  Surgeon: Jerilee Field, MD;  Location: [facility];   Service: Urology;  Laterality: Left;   LAPAROSCOPIC APPENDECTOMY  01/19/2016   Procedure: APPENDECTOMY LAPAROSCOPIC with orifice of cecal polyp;  Surgeon: Romie Levee, MD;  Location: [facility];  Service: General;;   LEFT HEART CATH AND CORONARY ANGIOGRAPHY N/A 04/07/2017   Procedure: Left Heart Cath and Coronary Angiography;  Surgeon: [name], MD;  Location: [facility];  Service: Cardiovascular;  Laterality: N/A;   PARATHYROIDECTOMY     Rt Superior open neck exploration   POLYPECTOMY     RIGHT OOPHORECTOMY     TONSILLECTOMY     Patient Active Problem List   Diagnosis Date Noted   Chills 03/31/2023   Lumbar radiculopathy 03/18/2023   Lumbar spondylosis 03/18/2023   Scoliosis deformity of spine 03/18/2023   Right sided weakness 03/18/2023   Somnolence, daytime 03/18/2023   Chronic bilateral low back pain without sciatica [M54.50, G89.29] 01/08/2023   Edema 01/08/2023   Ankle pain, left 11/22/2022   Other specified abnormal immunological findings in serum 04/03/2022   Pathological fracture of vertebra 04/03/2022   URI (upper respiratory infection) 04/03/2022   Allergic rhinitis 04/03/2022   Cough 04/03/2022   Anxiety disorder 06/12/2021   Unsteady gait when walking 01/19/2021   Diplopia 01/19/2021   Late onset Alzheimer's dementia without behavioral disturbance (HCC) 01/19/2021   Chronic  idiopathic constipation 01/19/2021   Labile blood pressure 01/19/2021   Acute parotitis 03/09/2019   Coronary artery disease involving native coronary artery of native heart without angina pectoris 04/28/2017   Syncope 04/07/2017   Shortness of breath 04/07/2017   Abnormal stress test 04/07/2017   Sinus bradycardia 02/21/2017   Coronary artery calcification 02/21/2017   Compression fracture of body of thoracic vertebra (HCC) 02/08/2017   Dizziness and giddiness 02/08/2017   Atrial fibrillation with rapid ventricular response (HCC) 01/21/2016   Colon polyp s/p appy/partialcecetomy  01/19/3016 01/19/2016   Orthostatic dizziness 04/25/2014   Vertigo 12/26/2012   Hypertension, uncontrolled 09/11/2012   Diastolic dysfunction 09/11/2012   Sleep disturbance 09/17/2011   Hypotension 12/26/2010   Orthostatic hypotension 12/26/2010   UTI'S, RECURRENT 11/20/2009   ADVERSE REACTION TO MEDICATION 07/31/2009   SCOLIOSIS 05/25/2009   FATIGUE 05/25/2009   DIVERTICULOSIS, COLON 02/23/2009   History of colonic polyps 02/23/2009   PARATHYROIDECTOMY 01/02/2009   NEPHROLITHIASIS, HX OF 11/15/2008   PALPITATIONS, OCCASIONAL 07/12/2008   Essential hypertension 02/04/2008   Constipation 01/01/2008   LEG PAIN, LEFT 01/01/2008   Osteoporosis 01/01/2008   Thoracic back pain 12/04/2007   HYPERPARATHYROIDISM UNSPECIFIED 10/20/2007   HYPERLIPIDEMIA 07/07/2007   ANEMIA-NOS 07/07/2007   URINARY INCONTINENCE 07/07/2007    PCP: Tresa Garter, MD  REFERRING PROVIDER: Napoleon Form, MD  REFERRING DIAG: [id number] (ICD-10-CM) - Dyssynergic defecation  THERAPY DIAG:  Muscle weakness (generalized)  Abnormal posture  Unspecified lack of coordination  Rationale for Evaluation and Treatment: Rehabilitation  ONSET DATE: years  SUBJECTIVE:                                                                                                                                                                                           SUBJECTIVE STATEMENT: Pt reports she has been taking 4 days of mirilax and no bowel movement and the 5th day took mirilax and metamucil and has had bowel movements daily and large movements, formed and no straining. No leakage in the few days with these.   Fluid intake: Yes: water - 30oz usually a day    PAIN:  Are you having pain? No  PRECAUTIONS: None  WEIGHT BEARING RESTRICTIONS: No  FALLS:  Has patient fallen in last 6 months? Yes. Number of falls a few days ago, fell on a step - reports difficulties with Rt leg sometimes  LIVING  ENVIRONMENT: Lives with: alone - in I living  Lives in: House/apartment   OCCUPATION: retired   PLOF: Independent  PATIENT GOALS: to have less leakage  PERTINENT HISTORY:  history of large tubulovillous adenoma in cecum status post endoscopic  removal, was extending into appendiceal orifice with incomplete removal status post extended appendectomy and partial cecotomy March 2017. Chronic irritable bowel syndrome with alternating constipation and diarrhea. severe sigmoid diverticulosis and internal hemorrhoids.does report having type 4-5 stool with urge and thinking this is more normal bowel movement then has the larger bowel movement without warning type 6 and unable to stop it.   Sexual abuse: No  BOWEL MOVEMENT: Pain with bowel movement: No Type of bowel movement:Type (Bristol Stool Scale) 1-7, Frequency sometimes goes a week without having bowel movement then goes for a few days, and Strain No Fully empty rectum: Yes:   Leakage: Yes:   Pads: Yes: small pad for urinary leakage and wears a depend when she is going a lot Fiber supplement: No but does take mirilax and prunes   URINATION: Pain with urination: No Fully empty bladder: Yes:   Stream: Strong and Weak Urgency: Yes:   Frequency: right at 2 hours Leakage: Urge to void, Coughing, Sneezing, Laughing, Exercise, and Bending forward Pads: Yes:    INTERCOURSE: Pain with intercourse:  not active   PREGNANCY: Vaginal deliveries 3 Tearing Yes: thinks so with all three C-section deliveries 0 Currently pregnant No  PROLAPSE: None   OBJECTIVE:   DIAGNOSTIC FINDINGS:     COGNITION: Overall cognitive status: Within functional limits for tasks assessed     SENSATION: Light touch: Appears intact Proprioception: Appears intact  MUSCLE LENGTH: Bil hamstrings and adductors limited by 50%   GAIT: Distance walked: 200' Assistive device utilized: None Level of assistance: Complete Independence Comments: decreased  cadence, decreased stride length, decreased Rt step height  POSTURE: rounded shoulders, forward head, increased thoracic kyphosis, posterior pelvic tilt, and right lateral lean  PELVIC ALIGNMENT: Rt pelvic obliquity   LUMBARAROM/PROM:  A/PROM A/PROM  eval  Flexion Decreased by 75%  Extension Decreased by 50%  Right lateral flexion Decreased by 75%  Left lateral flexion Decreased by 75%  Right rotation Decreased by 75%  Left rotation Decreased by 75%   (Blank rows = not tested)  LOWER EXTREMITY ROM:  WFL  LOWER EXTREMITY MMT:  Rt hip grossly 3+/5 except flexion 3/5; Lt hip grossly 3+/5, knees 4/5 05/20/23 Rt hip grossly 4/5 except flexion 3+/5; Lt hip grossly 4/5, knees 4/+5 PALPATION:   General  no TTP but does have noted fascial restrictions in all quadrants of abdomen                External Perineal Exam pt deferred                             Internal Pelvic Floor pt deferred  Patient confirms identification and approves PT to assess internal pelvic floor and treatment No 08/19/23  - continues to deny PELVIC MMT:   MMT eval  Vaginal   Internal Anal Sphincter   External Anal Sphincter   Puborectalis   Diastasis Recti   (Blank rows = not tested)        TONE: Pt deferred  PROLAPSE: Pt deferred  TODAY'S TREATMENT:  DATE:  08/19/23:  reviewed POC, progress with goals Pt deferred internal assessment and report she does not want to complete this at this time Pt asked about progress with bowels being better with change in fiber/laxative use now but is still having leakage with urine and would like to address this. Pt reports with stressors only, and PT educated on strengthening core/hip/pelvic floor to decreased urinary leakage with stressors. Pt agreed to this and would like to work on this at next session as long as bowels are still  improving.   PATIENT EDUCATION:  Education details: abdominal massage and voiding mechanics  AENJNADL Person educated: Patient Education method: Explanation, Demonstration, Tactile cues, Verbal cues, and Handouts Education comprehension: verbalized understanding and returned demonstration  HOME EXERCISE PROGRAM: abdominal massage and voiding mechanics  ASSESSMENT:  CLINICAL IMPRESSION: Patient presents for treatment,  reports she was feeling like she has made improvement but this past week she had not been able to have a bowel movement for 5 days and has been having leakage of stool with all urges. Pt does report she is no longer having urinary leakage, overall she has seen improvement in bowel movements and going fairly consistently to at least 3x per week for the past couple of months. Does plan to let referring provider know as she requested pt to return once voiding at least 3x per week. Pt reports she is bothered by leakage and community outings and also her blood pressure changes recently. Her doctors are following this. Pt has met all STG and 2/6 LTG. Pt has denied internal pelvic floor assessment to date with PFPT therefore this has not been assessed and pt has been educated on how this may be helpful for additional gains with strength and coordination. Pt reports she plans to discuss with doctor. Pt would benefit from additional PT to further address deficits.    OBJECTIVE IMPAIRMENTS: decreased coordination, decreased endurance, decreased mobility, difficulty walking, decreased strength, increased fascial restrictions, impaired flexibility, improper body mechanics, and postural dysfunction.   ACTIVITY LIMITATIONS: continence  PARTICIPATION LIMITATIONS: shopping and community activity  PERSONAL FACTORS: Time since onset of injury/illness/exacerbation are also affecting patient's functional outcome.   REHAB POTENTIAL: Good  CLINICAL DECISION MAKING:  Stable/uncomplicated  EVALUATION COMPLEXITY: Low   GOALS: Goals reviewed with patient? Yes  SHORT TERM GOALS: Target date: 04/09/23  Pt to be I with HEP.  Baseline: Goal status: MET  2.  Pt to be I with voiding and breathing mechanics and abdominal massage for improved bowel habits.  Baseline:  Goal status: MET  3.  Pt to be I with urge drill and knack method to improve urinary leakage.  Baseline:  Goal status: MET   LONG TERM GOALS: Target date: 06/11/23  Pt to be I with advanced HEP.  Baseline:  Goal status: MET  2.  Pt to demonstrate at least 5/5 bil hip strength for improved pelvic stability and functional squats without leakage.  Baseline:  Goal status: on going  3.  Pt to demonstrate improved coordination of pelvic floor and breathing mechanics with squat with appropriate synergistic patterns to decrease pain and leakage at least 75% of the time.    Baseline:  Goal status: on going  4.  Pt will report 3 BMs per week due to improved muscle tone and coordination with BMs.  Baseline:  Goal status: MET  5.  Pt will report her BMs are complete due to improved bowel habits and evacuation techniques.  Baseline:  Goal status: on going  6.  Pt to demonstrate at least 3/5 pelvic floor strength and holds for at least 8s for improved pelvic stability and decreased strain at pelvic floor/ decrease leakage.  Baseline:  Goal status: pt has deferred internal pelvic floor assessment would not want to do this  7.  Pt to report decreased urinary leakage symptoms to no more than 1x daily due to improved strength at core and hips and pelvic floor for improved pelvic stability and ability to stop urine.  Baseline:  Goal status: NEW  PLAN:  PT FREQUENCY: weekly  PT DURATION:  4 sessions  PLANNED INTERVENTIONS: Therapeutic exercises, Therapeutic activity, Neuromuscular re-education, Balance training, Patient/Family education, Self Care, Joint mobilization, Dry Needling,  Electrical stimulation, Spinal mobilization, Cryotherapy, Moist heat, scar mobilization, Biofeedback, and Manual therapy  PLAN FOR NEXT SESSION: hip/core strengthening with pelvic floor coordination and knack; continued bowel movement regularity    Otelia Sergeant, PT, DPT 10/01/244:39 PM

## 2023-08-21 ENCOUNTER — Encounter: Payer: Medicare Other | Admitting: Physical Therapy

## 2023-08-21 ENCOUNTER — Telehealth: Payer: Self-pay | Admitting: Physical Therapy

## 2023-08-21 NOTE — Telephone Encounter (Signed)
PT called pt to inform her she was scheduled for two appointments instead of one this week. Due to POC set at 1/weekly and her has already coming to one appointment this week, additional appointment will be cancelled. Clinic information left if pt needs to return call for any questions.  Sorry for the inconvenience.   Thank you,  Otelia Sergeant, PT, DPT 10/03/248:33 AM

## 2023-08-26 ENCOUNTER — Ambulatory Visit: Payer: Medicare Other | Admitting: Physical Therapy

## 2023-08-26 DIAGNOSIS — R2689 Other abnormalities of gait and mobility: Secondary | ICD-10-CM

## 2023-08-26 DIAGNOSIS — M6281 Muscle weakness (generalized): Secondary | ICD-10-CM

## 2023-08-26 DIAGNOSIS — R293 Abnormal posture: Secondary | ICD-10-CM | POA: Diagnosis not present

## 2023-08-26 DIAGNOSIS — R279 Unspecified lack of coordination: Secondary | ICD-10-CM | POA: Diagnosis not present

## 2023-08-26 NOTE — Therapy (Addendum)
 OUTPATIENT PHYSICAL THERAPY FEMALE PELVIC EVALUATION   Patient Name: [name] MRN: [id number] DOB:February 26, 1941, 82 y.o., female Today's Date: 08/26/2023  END OF SESSION:  PT End of Session - 08/26/23 1706     Visit Number 8    Date for PT Re-Evaluation 11/19/23    Authorization Type Medicare    Progress Note Due on Visit 17    PT Start Time 1530    PT Stop Time 1615    PT Time Calculation (min) 45 min    Activity Tolerance Patient tolerated treatment well    Behavior During Therapy [facility] for tasks assessed/performed                Past Medical History:  Diagnosis Date   Abdominal pain, unspecified site    ADVERSE REACTION TO MEDICATION 07/31/2009   ANEMIA-NOS 07/07/2007   Arthritis    Chest pain, unspecified    Chronic kidney disease    hx of kidney stone   COLONIC POLYPS, ADENOMATOUS, HX OF    Complication of anesthesia    Disturbance of skin sensation    DIVERTICULOSIS, COLON 02/23/2009   Family history of diabetes mellitus    Family history of malignant neoplasm of breast    FATIGUE 05/25/2009   Fever, unspecified    Heart murmur    hx of   HYPERLIPIDEMIA 07/07/2007   HYPERPARATHYROIDISM UNSPECIFIED 10/20/2007   HYPERTENSION 02/04/2008   Irritable bowel syndrome (IBS)    NEPHROLITHIASIS, HX OF 11/15/2008   OSTEOPOROSIS 01/01/2008   Other acquired absence of organ    parathyroidectomy   Pain in limb    PALPITATIONS, OCCASIONAL 07/12/2008   PARATHYROIDECTOMY 01/02/2009   PONV (postoperative nausea and vomiting)    SCOLIOSIS 05/25/2009   Seasonal allergies    Unspecified constipation    URINARY INCONTINENCE 07/07/2007   UTI'S, RECURRENT 11/20/2009   kimbrough    Past Surgical History:  Procedure Laterality Date   BUNIONECTOMY     CATARACT EXTRACTION, BILATERAL     COLONOSCOPY     EXTRACORPOREAL SHOCK WAVE LITHOTRIPSY Left 12/21/2020   Procedure: EXTRACORPOREAL SHOCK WAVE LITHOTRIPSY (ESWL);  Surgeon: Jerilee Field, MD;  Location: [facility];   Service: Urology;  Laterality: Left;   LAPAROSCOPIC APPENDECTOMY  01/19/2016   Procedure: APPENDECTOMY LAPAROSCOPIC with orifice of cecal polyp;  Surgeon: Romie Levee, MD;  Location: [facility];  Service: General;;   LEFT HEART CATH AND CORONARY ANGIOGRAPHY N/A 04/07/2017   Procedure: Left Heart Cath and Coronary Angiography;  Surgeon: [name], MD;  Location: [facility];  Service: Cardiovascular;  Laterality: N/A;   PARATHYROIDECTOMY     Rt Superior open neck exploration   POLYPECTOMY     RIGHT OOPHORECTOMY     TONSILLECTOMY     Patient Active Problem List   Diagnosis Date Noted   Chills 03/31/2023   Lumbar radiculopathy 03/18/2023   Lumbar spondylosis 03/18/2023   Scoliosis deformity of spine 03/18/2023   Right sided weakness 03/18/2023   Somnolence, daytime 03/18/2023   Chronic bilateral low back pain without sciatica [M54.50, G89.29] 01/08/2023   Edema 01/08/2023   Ankle pain, left 11/22/2022   Other specified abnormal immunological findings in serum 04/03/2022   Pathological fracture of vertebra 04/03/2022   URI (upper respiratory infection) 04/03/2022   Allergic rhinitis 04/03/2022   Cough 04/03/2022   Anxiety disorder 06/12/2021   Unsteady gait when walking 01/19/2021   Diplopia 01/19/2021   Late onset Alzheimer's dementia without behavioral disturbance (HCC) 01/19/2021  Chronic idiopathic constipation 01/19/2021   Labile blood pressure 01/19/2021   Acute parotitis 03/09/2019   Coronary artery disease involving native coronary artery of native heart without angina pectoris 04/28/2017   Syncope 04/07/2017   Shortness of breath 04/07/2017   Abnormal stress test 04/07/2017   Sinus bradycardia 02/21/2017   Coronary artery calcification 02/21/2017   Compression fracture of body of thoracic vertebra (HCC) 02/08/2017   Dizziness and giddiness 02/08/2017   Atrial fibrillation with rapid ventricular response (HCC) 01/21/2016   Colon polyp s/p appy/partialcecetomy  01/19/3016 01/19/2016   Orthostatic dizziness 04/25/2014   Vertigo 12/26/2012   Hypertension, uncontrolled 09/11/2012   Diastolic dysfunction 09/11/2012   Sleep disturbance 09/17/2011   Hypotension 12/26/2010   Orthostatic hypotension 12/26/2010   UTI'S, RECURRENT 11/20/2009   ADVERSE REACTION TO MEDICATION 07/31/2009   SCOLIOSIS 05/25/2009   FATIGUE 05/25/2009   DIVERTICULOSIS, COLON 02/23/2009   History of colonic polyps 02/23/2009   PARATHYROIDECTOMY 01/02/2009   NEPHROLITHIASIS, HX OF 11/15/2008   PALPITATIONS, OCCASIONAL 07/12/2008   Essential hypertension 02/04/2008   Constipation 01/01/2008   LEG PAIN, LEFT 01/01/2008   Osteoporosis 01/01/2008   Thoracic back pain 12/04/2007   HYPERPARATHYROIDISM UNSPECIFIED 10/20/2007   HYPERLIPIDEMIA 07/07/2007   ANEMIA-NOS 07/07/2007   URINARY INCONTINENCE 07/07/2007    PCP: Tresa Garter, MD  REFERRING PROVIDER: Napoleon Form, MD  REFERRING DIAG: [phone] (ICD-10-CM) - Dyssynergic defecation  THERAPY DIAG:  Abnormal posture  Other abnormalities of gait and mobility  Muscle weakness (generalized)  Rationale for Evaluation and Treatment: Rehabilitation  ONSET DATE: years  SUBJECTIVE:                                                                                                                                                                                           SUBJECTIVE STATEMENT: Pt reports she had return of fecal leakage and increased bowel movement this past week (4 on Monday).  Fluid intake: Yes: water - 30oz usually a day    PAIN:  Are you having pain? No  PRECAUTIONS: None  WEIGHT BEARING RESTRICTIONS: No  FALLS:  Has patient fallen in last 6 months? Yes. Number of falls a few days ago, fell on a step - reports difficulties with Rt leg sometimes  LIVING ENVIRONMENT: Lives with: alone - in I living  Lives in: House/apartment   OCCUPATION: retired   PLOF: Independent  PATIENT GOALS:  to have less leakage  PERTINENT HISTORY:  history of large tubulovillous adenoma in cecum status post endoscopic removal, was extending into appendiceal orifice with incomplete removal status post extended appendectomy and partial cecotomy March 2017. Chronic irritable bowel syndrome with  alternating constipation and diarrhea. severe sigmoid diverticulosis and internal hemorrhoids.does report having type 4-5 stool with urge and thinking this is more normal bowel movement then has the larger bowel movement without warning type 6 and unable to stop it.   Sexual abuse: No  BOWEL MOVEMENT: Pain with bowel movement: No Type of bowel movement:Type (Bristol Stool Scale) 1-7, Frequency sometimes goes a week without having bowel movement then goes for a few days, and Strain No Fully empty rectum: Yes:   Leakage: Yes:   Pads: Yes: small pad for urinary leakage and wears a depend when she is going a lot Fiber supplement: No but does take mirilax and prunes   URINATION: Pain with urination: No Fully empty bladder: Yes:   Stream: Strong and Weak Urgency: Yes:   Frequency: right at 2 hours Leakage: Urge to void, Coughing, Sneezing, Laughing, Exercise, and Bending forward Pads: Yes:    INTERCOURSE: Pain with intercourse:  not active   PREGNANCY: Vaginal deliveries 3 Tearing Yes: thinks so with all three C-section deliveries 0 Currently pregnant No  PROLAPSE: None   OBJECTIVE:   DIAGNOSTIC FINDINGS:     COGNITION: Overall cognitive status: Within functional limits for tasks assessed     SENSATION: Light touch: Appears intact Proprioception: Appears intact  MUSCLE LENGTH: Bil hamstrings and adductors limited by 50%   GAIT: Distance walked: 200' Assistive device utilized: None Level of assistance: Complete Independence Comments: decreased cadence, decreased stride length, decreased Rt step height  POSTURE: rounded shoulders, forward head, increased thoracic kyphosis,  posterior pelvic tilt, and right lateral lean  PELVIC ALIGNMENT: Rt pelvic obliquity   LUMBARAROM/PROM:  A/PROM A/PROM  eval  Flexion Decreased by 75%  Extension Decreased by 50%  Right lateral flexion Decreased by 75%  Left lateral flexion Decreased by 75%  Right rotation Decreased by 75%  Left rotation Decreased by 75%   (Blank rows = not tested)  LOWER EXTREMITY ROM:  WFL  LOWER EXTREMITY MMT:  Rt hip grossly 3+/5 except flexion 3/5; Lt hip grossly 3+/5, knees 4/5 05/20/23 Rt hip grossly 4/5 except flexion 3+/5; Lt hip grossly 4/5, knees 4/+5 PALPATION:   General  no TTP but does have noted fascial restrictions in all quadrants of abdomen                External Perineal Exam pt deferred                             Internal Pelvic Floor pt deferred  Patient confirms identification and approves PT to assess internal pelvic floor and treatment No 08/19/23  - continues to deny PELVIC MMT:   MMT eval  Vaginal   Internal Anal Sphincter   External Anal Sphincter   Puborectalis   Diastasis Recti   (Blank rows = not tested)        TONE: Pt deferred  PROLAPSE: Pt deferred  TODAY'S TREATMENT:  DATE:  08/19/23:  reviewed POC, progress with goals Pt deferred internal assessment and report she does not want to complete this at this time Pt asked about progress with bowels being better with change in fiber/laxative use now but is still having leakage with urine and would like to address this. Pt reports with stressors only, and PT educated on strengthening core/hip/pelvic floor to decreased urinary leakage with stressors. Pt agreed to this and would like to work on this at next session as long as bowels are still improving.  08/26/23: Pt reports she has been "woozy" and fatigued feeling, brought friend with her to drive and help her remember things  during session. With pt's symptoms returning pt unsure what could trigger this, PT and pt reviewed diet and did find symptoms are worse after eating dairy and that pt doesn't really eat anything for breakfast or lunch and sometimes will only eat 1-2 boosts. Pt given handout on fiber and water intake. Pt educated that maybe seeing a nutritionist could help with recommendations of this and improved food intake to assist in improving bowel bulk. Pt agreed. Due to pt feeling woozy at start of session BP taken manually and found to drop after 3 mins in standing.  BP 142/76 sitting BP  110/80 standing  Pt educated on this and encouraged to let her doctor know. Pt agreed.   PATIENT EDUCATION:  Education details: abdominal massage and voiding mechanics  AENJNADL Person educated: Patient Education method: Explanation, Demonstration, Tactile cues, Verbal cues, and Handouts Education comprehension: verbalized understanding and returned demonstration  HOME EXERCISE PROGRAM: abdominal massage and voiding mechanics  ASSESSMENT:  CLINICAL IMPRESSION: Patient had return of symptoms, session focused on review of food intake to understand if pt has pattern with worsening bowel and did find that dairy was eaten prior on several occasions, pt reports she plans to decreased intake of this and try to eat something more fiber based for breakfast or lunch. Pt also found to have BP drop in standing compared to sitting and educated on this. Educated to let her doctor know and agreed. Pt would benefit from additional PT to further address deficits.    OBJECTIVE IMPAIRMENTS: decreased coordination, decreased endurance, decreased mobility, difficulty walking, decreased strength, increased fascial restrictions, impaired flexibility, improper body mechanics, and postural dysfunction.   ACTIVITY LIMITATIONS: continence  PARTICIPATION LIMITATIONS: shopping and community activity  PERSONAL FACTORS: Time since onset of  injury/illness/exacerbation are also affecting patient's functional outcome.   REHAB POTENTIAL: Good  CLINICAL DECISION MAKING: Stable/uncomplicated  EVALUATION COMPLEXITY: Low   GOALS: Goals reviewed with patient? Yes  SHORT TERM GOALS: Target date: 04/09/23  Pt to be I with HEP.  Baseline: Goal status: MET  2.  Pt to be I with voiding and breathing mechanics and abdominal massage for improved bowel habits.  Baseline:  Goal status: MET  3.  Pt to be I with urge drill and knack method to improve urinary leakage.  Baseline:  Goal status: MET   LONG TERM GOALS: Target date: 06/11/23  Pt to be I with advanced HEP.  Baseline:  Goal status: MET  2.  Pt to demonstrate at least 5/5 bil hip strength for improved pelvic stability and functional squats without leakage.  Baseline:  Goal status: on going  3.  Pt to demonstrate improved coordination of pelvic floor and breathing mechanics with squat with appropriate synergistic patterns to decrease pain and leakage at least 75% of the time.    Baseline:  Goal status: on  going  4.  Pt will report 3 BMs per week due to improved muscle tone and coordination with BMs.  Baseline:  Goal status: MET  5.  Pt will report her BMs are complete due to improved bowel habits and evacuation techniques.  Baseline:  Goal status: on going  6.  Pt to demonstrate at least 3/5 pelvic floor strength and holds for at least 8s for improved pelvic stability and decreased strain at pelvic floor/ decrease leakage.  Baseline:  Goal status: pt has deferred internal pelvic floor assessment would not want to do this  7.  Pt to report decreased urinary leakage symptoms to no more than 1x daily due to improved strength at core and hips and pelvic floor for improved pelvic stability and ability to stop urine.  Baseline:  Goal status: NEW  PLAN:  PT FREQUENCY: weekly  PT DURATION:  4 sessions  PLANNED INTERVENTIONS: Therapeutic exercises, Therapeutic  activity, Neuromuscular re-education, Balance training, Patient/Family education, Self Care, Joint mobilization, Dry Needling, Electrical stimulation, Spinal mobilization, Cryotherapy, Moist heat, scar mobilization, Biofeedback, and Manual therapy  PLAN FOR NEXT SESSION: hip/core strengthening with pelvic floor coordination and knack; continued bowel movement regularity    Otelia Sergeant, PT, DPT 10/08/245:13 PM   PHYSICAL THERAPY DISCHARGE SUMMARY  Visits from Start of Care: 8  Current functional level related to goals / functional outcomes: Unable to reassess   Remaining deficits: Unable to reassess    Education / Equipment: HEP   Patient agrees to discharge. Patient goals were partially met. Patient is being discharged due to not returning since the last visit.  Otelia Sergeant, PT, DPT 01/26/2510:39 AM

## 2023-08-26 NOTE — Patient Instructions (Signed)
Types of Fiber  There are two main types of fiber:  insoluble and soluble.  Both of these types can prevent and relieve constipation and diarrhea, although some people find one or the other to be more easily digested.  This handout details information about both types of fiber. recommended 25-35 grams of fiber per day,  average 9-12 grams per meal   key is a balance between soluble and insoluble  Insoluble Fiber        Functions of Insoluble Fiber moves bulk through the intestines  controls and balances the pH (acidity) in the intestines   This type of fiber should be avoided or reduced if you have soft, frequent bowel movements or leakage      Benefits of Insoluble Fiber promotes regular bowel movement and prevents constipation  removes fecal waste through colon in less time  keeps an optimal pH in intestines to prevent microbes from producing cancer substances, therefore preventing colon cancer        Food Sources of Insoluble Fiber whole-wheat products  wheat bran "miller's bran" corn bran  flax seed or other seeds vegetables such as green beans, broccoli, cauliflower and potato skins  fruit skins and root vegetable skins  popcorn brown rice  Soluble Fiber( Types 5,6,7)       Functions of Soluble Fiber  holds water in the colon to bulk and soften the stool prolongs stomach emptying time so that sugar is released and absorbed more slowly  prevent leakage associated with soft, frequent bowel movements.        Benefits of Soluble Fiber lowers total cholesterol and LDL cholesterol (the bad cholesterol) therefore reducing the risk of heart disease  regulates blood sugar for people with diabetes       Food Sources of Soluble Fiber oat/oat bran dried beans and peas  nuts  barley  flax seed or other seeds fruits such as oranges, pears, peaches, and apples  vegetables such as carrots  psyllium husk  prunes  

## 2023-09-23 DIAGNOSIS — D485 Neoplasm of uncertain behavior of skin: Secondary | ICD-10-CM | POA: Diagnosis not present

## 2023-09-23 DIAGNOSIS — D229 Melanocytic nevi, unspecified: Secondary | ICD-10-CM | POA: Diagnosis not present

## 2023-09-23 DIAGNOSIS — L578 Other skin changes due to chronic exposure to nonionizing radiation: Secondary | ICD-10-CM | POA: Diagnosis not present

## 2023-09-23 DIAGNOSIS — C44519 Basal cell carcinoma of skin of other part of trunk: Secondary | ICD-10-CM | POA: Diagnosis not present

## 2023-09-23 DIAGNOSIS — L821 Other seborrheic keratosis: Secondary | ICD-10-CM | POA: Diagnosis not present

## 2023-09-23 DIAGNOSIS — Z85828 Personal history of other malignant neoplasm of skin: Secondary | ICD-10-CM | POA: Diagnosis not present

## 2023-09-23 DIAGNOSIS — L814 Other melanin hyperpigmentation: Secondary | ICD-10-CM | POA: Diagnosis not present

## 2023-09-23 DIAGNOSIS — I781 Nevus, non-neoplastic: Secondary | ICD-10-CM | POA: Diagnosis not present

## 2023-09-26 ENCOUNTER — Encounter (HOSPITAL_BASED_OUTPATIENT_CLINIC_OR_DEPARTMENT_OTHER): Payer: Self-pay | Admitting: Cardiology

## 2023-09-26 ENCOUNTER — Ambulatory Visit (HOSPITAL_BASED_OUTPATIENT_CLINIC_OR_DEPARTMENT_OTHER): Payer: Medicare Other | Admitting: Cardiology

## 2023-09-26 VITALS — BP 190/86 | HR 63 | Ht <= 58 in | Wt 116.0 lb

## 2023-09-26 DIAGNOSIS — R5383 Other fatigue: Secondary | ICD-10-CM

## 2023-09-26 DIAGNOSIS — E785 Hyperlipidemia, unspecified: Secondary | ICD-10-CM | POA: Diagnosis not present

## 2023-09-26 DIAGNOSIS — R0989 Other specified symptoms and signs involving the circulatory and respiratory systems: Secondary | ICD-10-CM | POA: Diagnosis not present

## 2023-09-26 DIAGNOSIS — I2510 Atherosclerotic heart disease of native coronary artery without angina pectoris: Secondary | ICD-10-CM | POA: Diagnosis not present

## 2023-09-26 DIAGNOSIS — I10 Essential (primary) hypertension: Secondary | ICD-10-CM

## 2023-09-26 MED ORDER — AMLODIPINE BESYLATE 2.5 MG PO TABS
2.5000 mg | ORAL_TABLET | Freq: Every day | ORAL | 3 refills | Status: DC
Start: 2023-09-26 — End: 2023-10-28

## 2023-09-26 NOTE — Patient Instructions (Signed)
Medication Instructions:  START AMLODIPINE 2.5 MG DAILY   *If you need a refill on your cardiac medications before your next appointment, please call your pharmacy*  Lab Work: NONE  Testing/Procedures: NONE  Follow-Up: At [facility], you and your health needs are our priority.  As part of our continuing mission to provide you with exceptional heart care, we have created designated Provider Care Teams.  These Care Teams include your primary Cardiologist (physician) and Advanced Practice Providers (APPs -  Physician Assistants and Nurse Practitioners) who all work together to provide you with the care you need, when you need it.  We recommend signing up for the patient portal called "MyChart".  Sign up information is provided on this After Visit Summary.  MyChart is used to connect with patients for Virtual Visits (Telemedicine).  Patients are able to view lab/test results, encounter notes, upcoming appointments, etc.  Non-urgent messages can be sent to your provider as well.   To learn more about what you can do with MyChart, go to [email or web address].    Your next appointment:   2 month(s)  Provider:   Jodelle Red, MD or Gillian Shields, NP    3-4 WEEKS NURSE VISIT TO CHECK BLOOD PRESSURE

## 2023-09-26 NOTE — Progress Notes (Signed)
Cardiology Office Note:  .   Date:  09/26/2023  ID:  [name], DOB Apr 02, 1941, MRN [id number] PCP: Tresa Garter, MD  [facility] Providers Cardiologist:  Jodelle Red, MD {  History of Present Illness: [name]   [name] is a 82 y.o. female with a hx of HTN, CAD, HLD, paroxysmal atrial fibrillation (during hospitalization in 2017) who is seen for follow up today. I initially met her 07/19/2019 as a new patient to me/prior Dr. Okey Dupre for the evaluation and management of cardiovascular disease.   Med history: Typical day: wakes up around 8-9 AM, Usually doesn't eat breakfast, has a cup of tea (with caffeine). Takes olmesartan around 10 AM. Doesn't usually eat the rest of the morning as it makes her sick. Eats early lunch around 11:30, usually about 1/2 sandwich. Feels her best around noon. Does well in the afternoon. Dinner is 5:30-6, eats well. Feels well in the evening. Takes her PM meds about 9 PM.   Today: Continues to have balance issues, walks with a walker. Has rare issues with shortness of breath coming out of nowhere, lasted about 10-15 minutes, was sitting still at the time.   Blood pressure very elevated in the office today, but brings a list of highly variable home numbers. Lowest 117/71, highest 196/109. Biggest help with her blood pressure is when she takes lorazepam. Feels worse with low BP as opposed to high BP.   Has chronic back pain. Takes one advil daily. Discussed recommendations.  ROS: Denies chest pain, shortness of breath at rest or with normal exertion. No PND, orthopnea, LE edema or unexpected weight gain. No syncope or palpitations. ROS otherwise negative except as noted.   Studies Reviewed: [name]    EKG:     not ordered today  Physical Exam:   VS:  BP (!) 190/86   Pulse 63   Ht 4\' 10"  (1.473 m)   Wt 116 lb (52.6 kg)   BMI 24.24 kg/m    Wt Readings from Last 3 Encounters:  09/26/23 116 lb (52.6 kg)  08/04/23 114 lb (51.7 kg)   08/01/23 117 lb (53.1 kg)    GEN: Well nourished, well developed in no acute distress HEENT: Normal, moist mucous membranes NECK: No JVD CARDIAC: regular rhythm, normal S1 and S2, no rubs or gallops. No murmur. VASCULAR: Radial and DP pulses 2+ bilaterally. No carotid bruits RESPIRATORY:  Clear to auscultation without rales, wheezing or rhonchi  ABDOMEN: Soft, non-tender, non-distended MUSCULOSKELETAL:  Ambulates independently SKIN: Warm and dry, trivial edema NEUROLOGIC:  Alert and oriented x 3. No focal neuro deficits noted. PSYCHIATRIC:  Normal affect    ASSESSMENT AND PLAN: .   nonobstructive CAD Hypercholesterolemia, LDL goal <70 -on cath 2018 -continue aspirin, atorvastatin -counseled on red flag warning signs that need immediate medical attention   Hypertension: labile -we have had multiple titrations. Changed to olmesartan from losartan -ambulatory blood pressure monitor reviewed. Remains above goal -limited options for meds. No beta blocker due to history of bradycardia. With intermittent low BP, would like to avoid thiazide/loop/MRA to avoid dehydration/orthostasis. Also has history of kidney stones.  Discussed amlodipine, hydralazine, clonidine, and doxazosin today. She wishes to start with low dose amlodipine.   Cardiac risk counseling and prevention recommendations: -recommend heart healthy/Mediterranean diet, with whole grains, fruits, vegetable, fish, lean meats, nuts, and olive oil. Limit salt. -recommend moderate walking, 3-5 times/week for 30-50 minutes each session. Aim for at least 150 minutes.week. Goal should be pace of  3 miles/hours, or walking 1.5 miles in 30 minutes -recommend avoidance of tobacco products. Avoid excess alcohol. -on aspirin 81 mg daily per personal preference  Dispo: 3 weeks for nurse BP visit, 2 mos with me or Gillian Shields  Signed, Jodelle Red, MD   Jodelle Red, MD, PhD, [facility]  [facility]  [facility] at [facility] at [facility] [address] [place], [place] [address] [phone]

## 2023-09-28 ENCOUNTER — Encounter: Payer: Self-pay | Admitting: Internal Medicine

## 2023-09-30 ENCOUNTER — Other Ambulatory Visit: Payer: Self-pay | Admitting: Internal Medicine

## 2023-09-30 MED ORDER — IPRATROPIUM BROMIDE 0.06 % NA SOLN: 2.0000 | Freq: Three times a day (TID) | NASAL | 2 refills | Status: DC

## 2023-10-07 DIAGNOSIS — H04123 Dry eye syndrome of bilateral lacrimal glands: Secondary | ICD-10-CM | POA: Diagnosis not present

## 2023-10-07 DIAGNOSIS — H02412 Mechanical ptosis of left eyelid: Secondary | ICD-10-CM | POA: Diagnosis not present

## 2023-10-09 ENCOUNTER — Emergency Department (HOSPITAL_COMMUNITY)
Admission: EM | Admit: 2023-10-09 | Discharge: 2023-10-09 | Disposition: A | Discharge status: Home / Self Care | Payer: Medicare Other | Attending: Emergency Medicine | Admitting: Emergency Medicine

## 2023-10-09 ENCOUNTER — Encounter (HOSPITAL_COMMUNITY): Payer: Self-pay

## 2023-10-09 ENCOUNTER — Emergency Department (HOSPITAL_COMMUNITY): Payer: Medicare Other

## 2023-10-09 ENCOUNTER — Other Ambulatory Visit: Payer: Self-pay

## 2023-10-09 DIAGNOSIS — S43402A Unspecified sprain of left shoulder joint, initial encounter: Secondary | ICD-10-CM | POA: Insufficient documentation

## 2023-10-09 DIAGNOSIS — I10 Essential (primary) hypertension: Secondary | ICD-10-CM

## 2023-10-09 DIAGNOSIS — W19XXXA Unspecified fall, initial encounter: Secondary | ICD-10-CM

## 2023-10-09 DIAGNOSIS — I129 Hypertensive chronic kidney disease with stage 1 through stage 4 chronic kidney disease, or unspecified chronic kidney disease: Secondary | ICD-10-CM | POA: Insufficient documentation

## 2023-10-09 DIAGNOSIS — N189 Chronic kidney disease, unspecified: Secondary | ICD-10-CM | POA: Diagnosis not present

## 2023-10-09 DIAGNOSIS — M25522 Pain in left elbow: Secondary | ICD-10-CM | POA: Diagnosis not present

## 2023-10-09 DIAGNOSIS — R519 Headache, unspecified: Secondary | ICD-10-CM | POA: Insufficient documentation

## 2023-10-09 DIAGNOSIS — M25519 Pain in unspecified shoulder: Secondary | ICD-10-CM | POA: Diagnosis not present

## 2023-10-09 DIAGNOSIS — S59902A Unspecified injury of left elbow, initial encounter: Secondary | ICD-10-CM | POA: Diagnosis present

## 2023-10-09 DIAGNOSIS — M79632 Pain in left forearm: Secondary | ICD-10-CM | POA: Diagnosis not present

## 2023-10-09 DIAGNOSIS — S0990XA Unspecified injury of head, initial encounter: Secondary | ICD-10-CM | POA: Diagnosis not present

## 2023-10-09 DIAGNOSIS — M25512 Pain in left shoulder: Secondary | ICD-10-CM | POA: Diagnosis not present

## 2023-10-09 DIAGNOSIS — S52022A Displaced fracture of olecranon process without intraarticular extension of left ulna, initial encounter for closed fracture: Secondary | ICD-10-CM | POA: Insufficient documentation

## 2023-10-09 DIAGNOSIS — I672 Cerebral atherosclerosis: Secondary | ICD-10-CM | POA: Diagnosis not present

## 2023-10-09 DIAGNOSIS — W010XXA Fall on same level from slipping, tripping and stumbling without subsequent striking against object, initial encounter: Secondary | ICD-10-CM | POA: Diagnosis not present

## 2023-10-09 DIAGNOSIS — M79622 Pain in left upper arm: Secondary | ICD-10-CM | POA: Diagnosis not present

## 2023-10-09 DIAGNOSIS — Z79899 Other long term (current) drug therapy: Secondary | ICD-10-CM | POA: Diagnosis not present

## 2023-10-09 MED ORDER — OXYCODONE HCL 5 MG PO TABS
5.0000 mg | ORAL_TABLET | Freq: Once | ORAL | Status: AC
  Administered 2023-10-09: 5 mg via ORAL
  Filled 2023-10-09: qty 1

## 2023-10-09 MED ORDER — AMLODIPINE BESYLATE 5 MG PO TABS
2.5000 mg | ORAL_TABLET | Freq: Once | ORAL | Status: AC
  Administered 2023-10-09: 2.5 mg via ORAL
  Filled 2023-10-09: qty 1

## 2023-10-09 MED ORDER — OXYCODONE HCL 5 MG PO TABS: 5.0000 mg | ORAL_TABLET | ORAL | 0 refills | Status: DC | PRN

## 2023-10-09 MED ORDER — IRBESARTAN 300 MG PO TABS
150.0000 mg | ORAL_TABLET | Freq: Every day | ORAL | Status: DC
  Administered 2023-10-09: 150 mg via ORAL
  Filled 2023-10-09: qty 1

## 2023-10-09 MED ORDER — ACETAMINOPHEN 500 MG PO TABS
1000.0000 mg | ORAL_TABLET | Freq: Once | ORAL | Status: AC
  Administered 2023-10-09: 1000 mg via ORAL
  Filled 2023-10-09: qty 2

## 2023-10-09 NOTE — Discharge Instructions (Addendum)
Thank you for coming to [facility] Emergency Department. You were seen for fall and elbow/shoulder pain. We did an exam, and imaging, and these showed an olecranon (elbow) fracture as well as a shoulder sprain. You were placed in a splint and sling. Please follow up with your orthopedic surgeon within 1-2 days. Your blood pressure was also very high. Please take your blood pressure medicines as prescribed and follow up with your primary care doctor about your high blood pressure.  You can take tylenol 1,000 mg every 8 hours as needed for pain. We will also prescribe a few oxycodone 5 mg to take as needed every 4- 6 hours for severe pain. Please rest, ice, and elevate the left arm to help control swelling and pain. Please do not bear weight with the left arm. You might be better cared for on the rehab side of your facility for the next several days.  Do not hesitate to return to the ED or call 911 if you experience: -Worsening symptoms -Numbness/tingling -Cold, blue, numb hand or arm -Lightheadedness, passing out -Chest pain, stroke-like symptoms -Fevers/chills -Anything else that concerns you

## 2023-10-09 NOTE — ED Triage Notes (Addendum)
PT BIB [facility] from independent living facility ([facility] after falling while getting tripped up in her walker 2 hrs ago PT hit head against the table and is c/o left shoulder to left elbow pain rated at 9. NO LOC, no bleeding and PT is not on any kind of thinners and is A+O X4.

## 2023-10-09 NOTE — ED Provider Notes (Signed)
[place] EMERGENCY DEPARTMENT AT [facility] Provider Note   CSN: [id number] Arrival date & time: 10/09/23  1930     History {Add pertinent medical, surgical, social history, OB history to HPI:1} Chief Complaint  Patient presents with   [name] is a 82 y.o. female with PMH as listed below who presents with fall at her assisted living facility today. Her walker got caught in the door and she fell, striking her head and her left elbow/shoulder. Denies acute neck pain or LOC. Doesn't take blood thinners. Does take blood pressure medicines and hasn't taken her nighttime meds yet. ***   Past Medical History:  Diagnosis Date   Abdominal pain, unspecified site    ADVERSE REACTION TO MEDICATION 07/31/2009   ANEMIA-NOS 07/07/2007   Arthritis    Chest pain, unspecified    Chronic kidney disease    hx of kidney stone   COLONIC POLYPS, ADENOMATOUS, HX OF    Complication of anesthesia    Disturbance of skin sensation    DIVERTICULOSIS, COLON 02/23/2009   Family history of diabetes mellitus    Family history of malignant neoplasm of breast    FATIGUE 05/25/2009   Fever, unspecified    Heart murmur    hx of   HYPERLIPIDEMIA 07/07/2007   HYPERPARATHYROIDISM UNSPECIFIED 10/20/2007   HYPERTENSION 02/04/2008   Irritable bowel syndrome (IBS)    NEPHROLITHIASIS, HX OF 11/15/2008   OSTEOPOROSIS 01/01/2008   Other acquired absence of organ    parathyroidectomy   Pain in limb    PALPITATIONS, OCCASIONAL 07/12/2008   PARATHYROIDECTOMY 01/02/2009   PONV (postoperative nausea and vomiting)    SCOLIOSIS 05/25/2009   Seasonal allergies    Unspecified constipation    URINARY INCONTINENCE 07/07/2007   UTI'S, RECURRENT 11/20/2009   kimbrough        Home Medications Prior to Admission medications   Medication Sig Start Date End Date Taking? Authorizing Provider  amLODipine (NORVASC) 2.5 MG tablet Take 1 tablet (2.5 mg total) by mouth daily. 09/26/23 12/25/23  Jodelle Red, MD  b complex vitamins capsule Take 1 capsule by mouth daily.    [provider]  Cholecalciferol (VITAMIN D) 50 MCG (2000 UT) CAPS Take 1 capsule by mouth daily.    [provider]  denosumab (PROLIA) 60 MG/ML SOSY injection Inject 60 mg into the skin every 6 (six) months.    [provider]  Ibuprofen (ADVIL) 200 MG CAPS Take 1 capsule by mouth daily.    [provider]  ipratropium (ATROVENT) 0.06 % nasal spray Place 2 sprays into the nose 3 (three) times daily. 09/30/23 09/29/24  Plotnikov, Georgina Quint, MD  LORazepam (ATIVAN) 0.5 MG tablet Take 1 tablet (0.5 mg total) by mouth 2 (two) times daily as needed for anxiety. 03/31/23   Plotnikov, Georgina Quint, MD  magnesium oxide (MAG-OX) 400 (240 Mg) MG tablet Take 400 mg by mouth at bedtime.    [provider]  olmesartan (BENICAR) 20 MG tablet Take 1 tablet (20 mg total) by mouth 2 (two) times daily. 05/26/23   Alver Sorrow, NP      Allergies    Anesthetics, amide and Lisinopril    Review of Systems   Review of Systems A 10 point review of systems was performed and is negative unless otherwise reported in HPI.  Physical Exam Updated Vital Signs BP (!) 225/81 (BP Location: Right Arm)   Pulse 71   Temp 97.9 F (36.6  C) (Oral)   SpO2 97%  Physical Exam General: Normal appearing {Desc; female/female:11659}, lying in bed.  HEENT: PERRLA, Sclera anicteric, MMM, trachea midline.  Cardiology: RRR, no murmurs/rubs/gallops. BL radial and DP pulses equal bilaterally.  Resp: Normal respiratory rate and effort. CTAB, no wheezes, rhonchi, crackles.  Abd: Soft, non-tender, non-distended. No rebound tenderness or guarding.  GU: Deferred. MSK: No peripheral edema or signs of trauma. Extremities without deformity or TTP. No cyanosis or clubbing. Skin: warm, dry. No rashes or lesions. Back: No CVA tenderness Neuro: A&Ox4, CNs II-XII grossly intact. MAEs. Sensation grossly intact.  Psych: Normal  mood and affect.   ED Results / Procedures / Treatments   Labs (all labs ordered are listed, but only abnormal results are displayed) Labs Reviewed - No data to display  EKG None  Radiology No results found.  Procedures Procedures  {Document cardiac monitor, telemetry assessment procedure when appropriate:1}  Medications Ordered in ED Medications - No data to display  ED Course/ Medical Decision Making/ A&P                          Medical Decision Making Amount and/or Complexity of Data Reviewed Radiology: ordered. Decision-making details documented in ED Course.  Risk OTC drugs. Prescription drug management.    This patient presents to the ED for concern of ***, this involves an extensive number of treatment options, and is a complaint that carries with it a high risk of complications and morbidity.  I considered the following differential and admission for this acute, potentially life threatening condition.   MDM:    ***  Clinical Course as of 10/10/23 0004  Thu Oct 09, 2023  2053 CT Head Wo Contrast 1. No acute intracranial abnormality. No skull fracture. 2. Age related atrophy.   [HN]  2053 DG Shoulder Left Portable There is a avulsion fracture of the olecranon with mild displacement associated soft tissue swelling and joint effusion.   [HN]  2245 BP now 170s systolic. Ortho tech called for splint. [HN]    Clinical Course User Index [HN] Loetta Rough, MD    Labs: I Ordered, and personally interpreted labs.  The pertinent results include:  ***  Imaging Studies ordered: I ordered imaging studies including *** I independently visualized and interpreted imaging. I agree with the radiologist interpretation  Additional history obtained from ***.  External records from outside source obtained and reviewed including ***  Cardiac Monitoring: The patient was maintained on a cardiac monitor.  I personally viewed and interpreted the cardiac monitored  which showed an underlying rhythm of: ***  Reevaluation: After the interventions noted above, I reevaluated the patient and found that they have :{resolved/improved/worsened:23923::"improved"}  Social Determinants of Health: ***  Disposition:  ***  Co morbidities that complicate the patient evaluation  Past Medical History:  Diagnosis Date   Abdominal pain, unspecified site    ADVERSE REACTION TO MEDICATION 07/31/2009   ANEMIA-NOS 07/07/2007   Arthritis    Chest pain, unspecified    Chronic kidney disease    hx of kidney stone   COLONIC POLYPS, ADENOMATOUS, HX OF    Complication of anesthesia    Disturbance of skin sensation    DIVERTICULOSIS, COLON 02/23/2009   Family history of diabetes mellitus    Family history of malignant neoplasm of breast    FATIGUE 05/25/2009   Fever, unspecified    Heart murmur    hx of   HYPERLIPIDEMIA 07/07/2007   HYPERPARATHYROIDISM  UNSPECIFIED 10/20/2007   HYPERTENSION 02/04/2008   Irritable bowel syndrome (IBS)    NEPHROLITHIASIS, HX OF 11/15/2008   OSTEOPOROSIS 01/01/2008   Other acquired absence of organ    parathyroidectomy   Pain in limb    PALPITATIONS, OCCASIONAL 07/12/2008   PARATHYROIDECTOMY 01/02/2009   PONV (postoperative nausea and vomiting)    SCOLIOSIS 05/25/2009   Seasonal allergies    Unspecified constipation    URINARY INCONTINENCE 07/07/2007   UTI'S, RECURRENT 11/20/2009   kimbrough      Medicines No orders of the defined types were placed in this encounter.   I have reviewed the patients home medicines and have made adjustments as needed  Problem List / ED Course: Problem List Items Addressed This Visit   None        {Document critical care time when appropriate:1} {Document review of labs and clinical decision tools ie heart score, Chads2Vasc2 etc:1}  {Document your independent review of radiology images, and any outside records:1} {Document your discussion with family members, caretakers, and with  consultants:1} {Document social determinants of health affecting pt's care:1} {Document your decision making why or why not admission, treatments were needed:1}  This note was created using dictation software, which may contain spelling or grammatical errors.

## 2023-10-09 NOTE — ED Notes (Signed)
Called [facility] and notified the rehab side of PT's arrival since PT would not be returning to the independent living side. A report was given to the rehab nurse in addition to a copy of her discharge instructions sent with the son. Rehab nurse was also notified about medications that were given to the PT right before d/c and new prescriptions.

## 2023-10-09 NOTE — Progress Notes (Signed)
Orthopedic Tech Progress Note Patient Details:  [name] 1941/07/09 [id number]  After I got my measurements on the good arm, wet fiberglass, I went to apply the splint but I noticed patient still had on her wedding ring, (so I removed that gave it to her son)  but by time I got ready to apply splint it has started harden. So I worked quick as I could without hurting patient. Rolled sweater down and applied sling,notified RN   Ortho Devices Type of Ortho Device: Ace wrap, Cotton web roll, Arm sling, Long arm splint Ortho Device/Splint Location: LUE Ortho Device/Splint Interventions: Ordered, Application, Adjustment   Post Interventions Patient Tolerated: Fair Instructions Provided: Care of device  Donald Pore 10/09/2023, 11:13 PM

## 2023-10-10 ENCOUNTER — Non-Acute Institutional Stay (SKILLED_NURSING_FACILITY): Payer: Medicare Other | Admitting: Adult Health

## 2023-10-10 ENCOUNTER — Encounter: Payer: Self-pay | Admitting: Adult Health

## 2023-10-10 DIAGNOSIS — S43492D Other sprain of left shoulder joint, subsequent encounter: Secondary | ICD-10-CM | POA: Diagnosis not present

## 2023-10-10 DIAGNOSIS — R600 Localized edema: Secondary | ICD-10-CM | POA: Diagnosis not present

## 2023-10-10 DIAGNOSIS — K5904 Chronic idiopathic constipation: Secondary | ICD-10-CM | POA: Diagnosis not present

## 2023-10-10 DIAGNOSIS — M25522 Pain in left elbow: Secondary | ICD-10-CM | POA: Diagnosis not present

## 2023-10-10 DIAGNOSIS — M63832 Disorders of muscle in diseases classified elsewhere, left forearm: Secondary | ICD-10-CM | POA: Diagnosis not present

## 2023-10-10 DIAGNOSIS — F419 Anxiety disorder, unspecified: Secondary | ICD-10-CM

## 2023-10-10 DIAGNOSIS — S52022A Displaced fracture of olecranon process without intraarticular extension of left ulna, initial encounter for closed fracture: Secondary | ICD-10-CM

## 2023-10-10 DIAGNOSIS — I10 Essential (primary) hypertension: Secondary | ICD-10-CM | POA: Diagnosis not present

## 2023-10-10 DIAGNOSIS — M63812 Disorders of muscle in diseases classified elsewhere, left shoulder: Secondary | ICD-10-CM | POA: Diagnosis not present

## 2023-10-10 DIAGNOSIS — M6259 Muscle wasting and atrophy, not elsewhere classified, multiple sites: Secondary | ICD-10-CM | POA: Diagnosis not present

## 2023-10-10 DIAGNOSIS — M25512 Pain in left shoulder: Secondary | ICD-10-CM | POA: Diagnosis not present

## 2023-10-10 DIAGNOSIS — S52022D Displaced fracture of olecranon process without intraarticular extension of left ulna, subsequent encounter for closed fracture with routine healing: Secondary | ICD-10-CM | POA: Diagnosis not present

## 2023-10-10 DIAGNOSIS — M810 Age-related osteoporosis without current pathological fracture: Secondary | ICD-10-CM

## 2023-10-10 DIAGNOSIS — R2689 Other abnormalities of gait and mobility: Secondary | ICD-10-CM | POA: Diagnosis not present

## 2023-10-10 DIAGNOSIS — R413 Other amnesia: Secondary | ICD-10-CM

## 2023-10-10 DIAGNOSIS — R4189 Other symptoms and signs involving cognitive functions and awareness: Secondary | ICD-10-CM | POA: Diagnosis not present

## 2023-10-10 DIAGNOSIS — Z9181 History of falling: Secondary | ICD-10-CM | POA: Diagnosis not present

## 2023-10-10 DIAGNOSIS — R278 Other lack of coordination: Secondary | ICD-10-CM | POA: Diagnosis not present

## 2023-10-10 MED ORDER — ACETAMINOPHEN 500 MG PO TABS: 500.0000 mg | ORAL_TABLET | Freq: Three times a day (TID) | ORAL | Status: DC | PRN

## 2023-10-10 MED ORDER — LORAZEPAM 0.5 MG PO TABS
0.5000 mg | ORAL_TABLET | Freq: Two times a day (BID) | ORAL | 0 refills | Status: AC | PRN
Start: 2023-10-10 — End: 2023-10-24

## 2023-10-10 MED ORDER — VITAMIN D3 125 MCG (5000 UT) PO CAPS: 5000.0000 [IU] | ORAL_CAPSULE | Freq: Every day | ORAL | Status: AC

## 2023-10-10 MED ORDER — ONDANSETRON HCL 4 MG PO TABS: 4.0000 mg | ORAL_TABLET | Freq: Three times a day (TID) | ORAL | Status: DC | PRN

## 2023-10-10 MED ORDER — OXYCODONE HCL 5 MG PO TABS: 5.0000 mg | ORAL_TABLET | ORAL | 0 refills | Status: DC | PRN

## 2023-10-10 MED ORDER — POLYETHYLENE GLYCOL 3350 17 GM/SCOOP PO POWD: 17.0000 g | Freq: Every day | ORAL | Status: DC | PRN

## 2023-10-10 NOTE — Progress Notes (Signed)
Location:  Medical illustrator of Service:  SNF (31) Provider:   Peggye Ley, ANP [facility] [phone]   Plotnikov, Georgina Quint, MD  Patient Care Team: Tresa Garter, MD as PCP - General (Internal Medicine) Jodelle Red, MD as PCP - Cardiology (Cardiology) [place], Amy, MD (Dermatology) [place], [place] Lowella Bandy., MD (Urology) Romie Levee, MD as Consulting Physician (General Surgery) Napoleon Form, MD as Consulting Physician (Gastroenterology) Melina Fiddler, MD as Consulting Physician (Sports Medicine)  Extended Emergency Contact Information Primary Emergency Contact: Cassata,Nick Mobile Phone: [phone] Relation: Son  Code Status:  DNR Goals of care: Advanced Directive information    03/12/2023    2:15 PM  Advanced Directives  Does Patient Have a Medical Advance Directive? Yes  Does patient want to make changes to medical advance directive? No - Patient declined     Chief Complaint  Patient presents with   Acute Visit    F/U fall ED visit.     HPI:  Pt is a 82 y.o. female seen today for a follow up after an ER visit due to a fall. She had a mechanical fall and struck her head and left elbow and shoulder. In the ED her BP was 225/81. She had not had her home bp meds yet.  CT of the head showed age related atrophy but no acute abnormality Xray of the left forearm showed There is a avulsion fracture of the olecranon with mild displacement associated soft tissue swelling and joint effusion.  She was diagnosed with a left shoulder sprain and an olecranon fracture. She was placed in a long arm splint with cotton web roll and ace wrap.  She reports her pain is well controlled right now with ice and oxycodone. She did feel some nausea after taking the oxycodone.   Of note she has short term memory loss.   BP was elevated in the ED now 162/84.  She was started on Norvasc in early November. Has had issues with  orthostatic hypotension. Also bradycardia, hyperparathyroid s/p parathyroidectomy, constipation, afib, CAD, partial cecetomy, back pain, osteoporosis, renal stones, HLD, recurrent UTI, and anxiety  She reports anxiety and says prn ativan helps.   Past Medical History:  Diagnosis Date   Abdominal pain, unspecified site    ADVERSE REACTION TO MEDICATION 07/31/2009   ANEMIA-NOS 07/07/2007   Arthritis    Chest pain, unspecified    Chronic kidney disease    hx of kidney stone   COLONIC POLYPS, ADENOMATOUS, HX OF    Complication of anesthesia    Disturbance of skin sensation    DIVERTICULOSIS, COLON 02/23/2009   Family history of diabetes mellitus    Family history of malignant neoplasm of breast    FATIGUE 05/25/2009   Fever, unspecified    Heart murmur    hx of   HYPERLIPIDEMIA 07/07/2007   HYPERPARATHYROIDISM UNSPECIFIED 10/20/2007   HYPERTENSION 02/04/2008   Irritable bowel syndrome (IBS)    NEPHROLITHIASIS, HX OF 11/15/2008   OSTEOPOROSIS 01/01/2008   Other acquired absence of organ    parathyroidectomy   Pain in limb    PALPITATIONS, OCCASIONAL 07/12/2008   PARATHYROIDECTOMY 01/02/2009   PONV (postoperative nausea and vomiting)    SCOLIOSIS 05/25/2009   Seasonal allergies    Unspecified constipation    URINARY INCONTINENCE 07/07/2007   UTI'S, RECURRENT 11/20/2009   kimbrough    Past Surgical History:  Procedure Laterality Date   BUNIONECTOMY     CATARACT EXTRACTION, BILATERAL  COLONOSCOPY     EXTRACORPOREAL SHOCK WAVE LITHOTRIPSY Left 12/21/2020   Procedure: EXTRACORPOREAL SHOCK WAVE LITHOTRIPSY (ESWL);  Surgeon: Jerilee Field, MD;  Location: [facility];  Service: Urology;  Laterality: Left;   LAPAROSCOPIC APPENDECTOMY  01/19/2016   Procedure: APPENDECTOMY LAPAROSCOPIC with orifice of cecal polyp;  Surgeon: Romie Levee, MD;  Location: [facility];  Service: General;;   LEFT HEART CATH AND CORONARY ANGIOGRAPHY N/A 04/07/2017   Procedure: Left Heart Cath and Coronary  Angiography;  Surgeon: [name], MD;  Location: [facility];  Service: Cardiovascular;  Laterality: N/A;   PARATHYROIDECTOMY     Rt [address] open neck exploration   POLYPECTOMY     RIGHT OOPHORECTOMY     TONSILLECTOMY      Allergies  Allergen Reactions   Anesthetics, Amide Other (See Comments)    Pt states she had hallucinations and HTN after procedure 01/19/16-she feels may have been reaction to anesthetic   Lisinopril Cough    Outpatient Encounter Medications as of 10/10/2023  Medication Sig   amLODipine (NORVASC) 2.5 MG tablet Take 1 tablet (2.5 mg total) by mouth daily.   b complex vitamins capsule Take 1 capsule by mouth daily.   Cholecalciferol (VITAMIN D3) 125 MCG (5000 UT) CAPS Take 1 capsule (5,000 Units total) by mouth daily.   denosumab (PROLIA) 60 MG/ML SOSY injection Inject 60 mg into the skin every 6 (six) months.   LORazepam (ATIVAN) 0.5 MG tablet Take 1 tablet (0.5 mg total) by mouth 2 (two) times daily as needed for up to 14 days for anxiety.   olmesartan (BENICAR) 20 MG tablet Take 1 tablet (20 mg total) by mouth 2 (two) times daily.   oxyCODONE (ROXICODONE) 5 MG immediate release tablet Take 1 tablet (5 mg total) by mouth every 4 (four) hours as needed for up to 5 days for severe pain (pain score 7-10).   [DISCONTINUED] Cholecalciferol (VITAMIN D) 50 MCG (2000 UT) CAPS Take 1 capsule by mouth daily.   [DISCONTINUED] Ibuprofen (ADVIL) 200 MG CAPS Take 1 capsule by mouth daily.   [DISCONTINUED] ipratropium (ATROVENT) 0.06 % nasal spray Place 2 sprays into the nose 3 (three) times daily.   [DISCONTINUED] LORazepam (ATIVAN) 0.5 MG tablet Take 1 tablet (0.5 mg total) by mouth 2 (two) times daily as needed for anxiety.   [DISCONTINUED] magnesium oxide (MAG-OX) 400 (240 Mg) MG tablet Take 400 mg by mouth at bedtime.   [DISCONTINUED] oxyCODONE (ROXICODONE) 5 MG immediate release tablet Take 1 tablet (5 mg total) by mouth every 4 (four) hours as needed for up to 3  days for severe pain (pain score 7-10).   No facility-administered encounter medications on file as of 10/10/2023.    Review of Systems  Constitutional:  Positive for activity change. Negative for appetite change, chills, diaphoresis, fatigue, fever and unexpected weight change.  HENT:  Negative for congestion.   Respiratory:  Negative for cough, shortness of breath and wheezing.   Cardiovascular:  Positive for leg swelling. Negative for chest pain and palpitations.  Gastrointestinal:  Negative for abdominal distention, abdominal pain, constipation and diarrhea.  Genitourinary:  Negative for difficulty urinating and dysuria.  Musculoskeletal:  Positive for arthralgias, gait problem and joint swelling. Negative for back pain and myalgias.  Neurological:  Negative for dizziness, tremors, seizures, syncope, facial asymmetry, speech difficulty, weakness, light-headedness, numbness and headaches.  Psychiatric/Behavioral:  Negative for agitation, behavioral problems and confusion.     Immunization History  Administered Date(s) Administered   [employer]  Trivalent(High Dose 65+) 08/04/2023   Influenza Split 09/17/2011, 11/08/2015, 09/12/2017, 08/02/2020   Influenza Whole 08/19/2008   Influenza, High Dose Seasonal PF 08/20/2016, 09/12/2017, 08/21/2018, 09/28/2019, 10/12/2021, 09/01/2022   Influenza-Unspecified 09/18/2013, 08/18/2014, 11/08/2015, 09/15/2020   Moderna SARS-COV2 Booster Vaccination 07/03/2021   Moderna Sars-Covid-2 Vaccination 11/30/2019, 12/31/2019, 10/26/2020, 07/03/2021, 04/22/2022   Pneumococcal Conjugate-13 12/29/2013   Pneumococcal Polysaccharide-23 11/08/2015   Tdap 01/05/2015   Zoster Recombinant(Shingrix) 06/20/2021, 09/05/2021   Pertinent  Health Maintenance Due  Topic Date Due   Colonoscopy  06/04/2019   INFLUENZA VACCINE  Completed   DEXA SCAN  Completed      03/17/2023   11:40 AM 03/18/2023    2:49 PM 03/31/2023   11:10 AM 04/23/2023   11:26 AM 08/04/2023    3:04 PM   Fall Risk  Falls in the past year? 1 1 0 0 0  Was there an injury with Fall? 0 1 0 0 0  Fall Risk Category Calculator 1 2 0 0 0  Patient at Risk for Falls Due to Impaired balance/gait;History of fall(s);Other (Comment) History of fall(s) No Fall Risks No Fall Risks No Fall Risks  Patient at Risk for Falls Due to - Comments history of fractures      Fall risk Follow up Falls prevention discussed;Education provided;Falls evaluation completed Falls evaluation completed Falls evaluation completed Falls evaluation completed Falls evaluation completed   Functional Status Survey:    Vitals:   10/10/23 1129  BP: (!) 162/84  Pulse: 75  Resp: 16  Temp: 98.2 F (36.8 C)  SpO2: 96%   There is no height or weight on file to calculate BMI. Physical Exam Vitals and nursing note reviewed.  Constitutional:      General: She is not in acute distress.    Appearance: She is not diaphoretic.  HENT:     Head: Normocephalic and atraumatic.  Neck:     Vascular: No JVD.  Cardiovascular:     Rate and Rhythm: Normal rate and regular rhythm.     Heart sounds: No murmur heard. Pulmonary:     Effort: Pulmonary effort is normal. No respiratory distress.     Breath sounds: Normal breath sounds. No wheezing.  Musculoskeletal:     Right lower leg: No edema.     Left lower leg: No edema.     Comments: +CMS of LUE. Wrapped in splint and ace wrap.   Skin:    General: Skin is warm and dry.  Neurological:     Mental Status: She is alert and oriented to person, place, and time.     Labs reviewed: Recent Labs    11/22/22 1137 03/18/23 1544  NA 141 142  K 3.9 4.1  CL 107 108  CO2 26 26  GLUCOSE 87 103*  BUN 17 18  CREATININE 0.77 0.81  CALCIUM 9.3 9.3   Recent Labs    11/22/22 1137 03/18/23 1544  AST 21 19  ALT 15 12  ALKPHOS 51 59  BILITOT 1.0 0.8  PROT 6.5 6.5  ALBUMIN 4.1 4.0   No results for input(s): "WBC", "NEUTROABS", "HGB", "HCT", "MCV", "PLT" in the last 8760 hours. Lab  Results  Component Value Date   TSH 1.20 03/18/2023   No results found for: "HGBA1C" Lab Results  Component Value Date   CHOL 132 01/25/2015   HDL 68 01/25/2015   LDLCALC 51 01/25/2015   LDLDIRECT 156.3 03/28/2011   TRIG 64 01/25/2015   CHOLHDL 1.9 01/25/2015    Significant Diagnostic Results  in last 30 days:  CT Head Wo Contrast  Result Date: 10/09/2023 CLINICAL DATA:  Head trauma, fall. EXAM: CT HEAD WITHOUT CONTRAST TECHNIQUE: Contiguous axial images were obtained from the base of the skull through the vertex without intravenous contrast. RADIATION DOSE REDUCTION: This exam was performed according to the departmental dose-optimization program which includes automated exposure control, adjustment of the mA and/or kV according to patient size and/or use of iterative reconstruction technique. COMPARISON:  03/19/2023 FINDINGS: Brain: No intracranial hemorrhage, mass effect, or midline shift. Age related atrophy. No hydrocephalus. The basilar cisterns are patent. No evidence of territorial infarct or acute ischemia. No extra-axial or intracranial fluid collection. Vascular: Atherosclerosis of skullbase vasculature without hyperdense vessel or abnormal calcification. Skull: No fracture or focal lesion. Sinuses/Orbits: No acute findings. Frothy debris within the left sphenoid sinus, chronic. The visualized orbits are unremarkable. Bilateral cataract resection. Other: No large scalp hematoma. IMPRESSION: 1. No acute intracranial abnormality. No skull fracture. 2. Age related atrophy. Electronically Signed   By: Narda Rutherford M.D.   On: 10/09/2023 20:42   DG Shoulder Left Portable  Result Date: 10/09/2023 CLINICAL DATA:  Pain after fall EXAM: LEFT SHOULDER one view; LEFT ELBOW - COMPLETE 4 VIEW; LEFT HUMERUS - 2 VIEW; LEFT FOREARM - 2 VIEW COMPARISON:  None Available. FINDINGS: There is a avulsion fracture of the olecranon with mild displacement associated soft tissue swelling and joint  effusion. No additional areas of fracture or dislocation. Preserved overall joint spaces including glenohumeral, AC joint, radiocarpal joint. Severe osteopenia. Overlapping splint. IMPRESSION: There is a avulsion fracture of the olecranon with mild displacement associated soft tissue swelling and joint effusion. Electronically Signed   By: Karen Kays M.D.   On: 10/09/2023 20:36   DG Elbow Complete Left  Result Date: 10/09/2023 CLINICAL DATA:  Pain after fall EXAM: LEFT SHOULDER one view; LEFT ELBOW - COMPLETE 4 VIEW; LEFT HUMERUS - 2 VIEW; LEFT FOREARM - 2 VIEW COMPARISON:  None Available. FINDINGS: There is a avulsion fracture of the olecranon with mild displacement associated soft tissue swelling and joint effusion. No additional areas of fracture or dislocation. Preserved overall joint spaces including glenohumeral, AC joint, radiocarpal joint. Severe osteopenia. Overlapping splint. IMPRESSION: There is a avulsion fracture of the olecranon with mild displacement associated soft tissue swelling and joint effusion. Electronically Signed   By: Karen Kays M.D.   On: 10/09/2023 20:36   DG Humerus Left  Result Date: 10/09/2023 CLINICAL DATA:  Pain after fall EXAM: LEFT SHOULDER one view; LEFT ELBOW - COMPLETE 4 VIEW; LEFT HUMERUS - 2 VIEW; LEFT FOREARM - 2 VIEW COMPARISON:  None Available. FINDINGS: There is a avulsion fracture of the olecranon with mild displacement associated soft tissue swelling and joint effusion. No additional areas of fracture or dislocation. Preserved overall joint spaces including glenohumeral, AC joint, radiocarpal joint. Severe osteopenia. Overlapping splint. IMPRESSION: There is a avulsion fracture of the olecranon with mild displacement associated soft tissue swelling and joint effusion. Electronically Signed   By: Karen Kays M.D.   On: 10/09/2023 20:36   DG Forearm Left  Result Date: 10/09/2023 CLINICAL DATA:  Pain after fall EXAM: LEFT SHOULDER one view; LEFT ELBOW -  COMPLETE 4 VIEW; LEFT HUMERUS - 2 VIEW; LEFT FOREARM - 2 VIEW COMPARISON:  None Available. FINDINGS: There is a avulsion fracture of the olecranon with mild displacement associated soft tissue swelling and joint effusion. No additional areas of fracture or dislocation. Preserved overall joint spaces including glenohumeral, AC joint, radiocarpal  joint. Severe osteopenia. Overlapping splint. IMPRESSION: There is a avulsion fracture of the olecranon with mild displacement associated soft tissue swelling and joint effusion. Electronically Signed   By: Karen Kays M.D.   On: 10/09/2023 20:36    Assessment/Plan 1. Olecranon fracture, left, closed, initial encounter F/U with ortho [facility] until seen Continue tylenol and oxycodone for pain At zofran for nausea.  PT and OT  2. Essential hypertension Started on Norvasc earlier this month Has an apt with cardiology RN as f/u. We can obtain her bp records and fax to them.   3. Memory loss Has short term memory loss Will get MMSE.   4. Age-related osteoporosis without current pathological fracture On prolia given at Dr Nickola Major office.   5. Anxiety Using ativan prn at home   6. Chronic idiopathic constipation Miralax every day prn     Family/ staff Communication: nurse   Labs/tests ordered:  NA

## 2023-10-12 DIAGNOSIS — S52022D Displaced fracture of olecranon process without intraarticular extension of left ulna, subsequent encounter for closed fracture with routine healing: Secondary | ICD-10-CM | POA: Diagnosis not present

## 2023-10-12 DIAGNOSIS — M25522 Pain in left elbow: Secondary | ICD-10-CM | POA: Diagnosis not present

## 2023-10-12 DIAGNOSIS — M25512 Pain in left shoulder: Secondary | ICD-10-CM | POA: Diagnosis not present

## 2023-10-12 DIAGNOSIS — R2689 Other abnormalities of gait and mobility: Secondary | ICD-10-CM | POA: Diagnosis not present

## 2023-10-12 DIAGNOSIS — M6259 Muscle wasting and atrophy, not elsewhere classified, multiple sites: Secondary | ICD-10-CM | POA: Diagnosis not present

## 2023-10-12 DIAGNOSIS — M63812 Disorders of muscle in diseases classified elsewhere, left shoulder: Secondary | ICD-10-CM | POA: Diagnosis not present

## 2023-10-13 ENCOUNTER — Telehealth: Payer: Self-pay | Admitting: *Deleted

## 2023-10-13 DIAGNOSIS — S52022A Displaced fracture of olecranon process without intraarticular extension of left ulna, initial encounter for closed fracture: Secondary | ICD-10-CM | POA: Diagnosis not present

## 2023-10-13 NOTE — Telephone Encounter (Signed)
   Pre-operative Risk Assessment    Patient Name: [name]  DOB: 27-Dec-1940 MRN: [id number]   Last OV: Dr. Cristal Deer 09/26/2023 Upcoming OV: [name] 12/09/2023  Request for Surgical Clearance    Procedure:   Left ORIF olecranon  Date of Surgery:  Clearance 10/21/23                                 Surgeon:  Dr. Margarita Rana Surgeon's Group or Practice Name:  [facility] Phone number:  [phone] Fax number:  [phone]   Type of Clearance Requested:   - Medical    Type of Anesthesia:   Choice   Additional requests/questions:    Signed, Emmit Pomfret   10/13/2023, 4:18 PM

## 2023-10-13 NOTE — H&P (Signed)
PREOPERATIVE H&P  Chief Complaint: LEFT OLECRANON FRACTURE,BURSITIS  HPI: [name] is a 82 y.o. female who presents with a diagnosis of LEFT OLECRANON FRACTURE,BURSITIS. Symptoms are rated as moderate to severe, and have been worsening.  This is significantly impairing activities of daily living.  She has elected for surgical management.   Past Medical History:  Diagnosis Date   Abdominal pain, unspecified site    ADVERSE REACTION TO MEDICATION 07/31/2009   ANEMIA-NOS 07/07/2007   Arthritis    Chest pain, unspecified    Chronic kidney disease    hx of kidney stone   COLONIC POLYPS, ADENOMATOUS, HX OF    Complication of anesthesia    Disturbance of skin sensation    DIVERTICULOSIS, COLON 02/23/2009   Family history of diabetes mellitus    Family history of malignant neoplasm of breast    FATIGUE 05/25/2009   Fever, unspecified    Heart murmur    hx of   HYPERLIPIDEMIA 07/07/2007   HYPERPARATHYROIDISM UNSPECIFIED 10/20/2007   HYPERTENSION 02/04/2008   Irritable bowel syndrome (IBS)    NEPHROLITHIASIS, HX OF 11/15/2008   OSTEOPOROSIS 01/01/2008   Other acquired absence of organ    parathyroidectomy   Pain in limb    PALPITATIONS, OCCASIONAL 07/12/2008   PARATHYROIDECTOMY 01/02/2009   PONV (postoperative nausea and vomiting)    SCOLIOSIS 05/25/2009   Seasonal allergies    Unspecified constipation    URINARY INCONTINENCE 07/07/2007   UTI'S, RECURRENT 11/20/2009   kimbrough    Past Surgical History:  Procedure Laterality Date   BUNIONECTOMY     CATARACT EXTRACTION, BILATERAL     COLONOSCOPY     EXTRACORPOREAL SHOCK WAVE LITHOTRIPSY Left 12/21/2020   Procedure: EXTRACORPOREAL SHOCK WAVE LITHOTRIPSY (ESWL);  Surgeon: Jerilee Field, MD;  Location: [facility];  Service: Urology;  Laterality: Left;   LAPAROSCOPIC APPENDECTOMY  01/19/2016   Procedure: APPENDECTOMY LAPAROSCOPIC with orifice of cecal polyp;  Surgeon: Romie Levee, MD;  Location: [facility];  Service:  General;;   LEFT HEART CATH AND CORONARY ANGIOGRAPHY N/A 04/07/2017   Procedure: Left Heart Cath and Coronary Angiography;  Surgeon: [name], MD;  Location: [facility];  Service: Cardiovascular;  Laterality: N/A;   PARATHYROIDECTOMY     Rt Superior open neck exploration   POLYPECTOMY     RIGHT OOPHORECTOMY     TONSILLECTOMY     Social History   Socioeconomic History   Marital status: Widowed    Spouse name: Not on file   Number of children: 3   Years of education: Not on file   Highest education level: Not on file  Occupational History   Occupation: travel agent    Employer: RETIRED  Tobacco Use   Smoking status: Never   Smokeless tobacco: Never  Vaping Use   Vaping status: Never Used  Substance and Sexual Activity   Alcohol use: Yes    Comment: 1 glass of wine per day   Drug use: No   Sexual activity: Never  Other Topics Concern   Not on file  Social History Narrative       2-3 c coffee in am    ocass wine    G3P2vaginal delivery   Pt cell [phone]      Patient walks daily for exercise.    Social Determinants of Health   Financial Resource Strain: Low Risk  (03/17/2023)   Overall Financial Resource Strain (CARDIA)    Difficulty of Paying Living Expenses: Not very hard  Food Insecurity: No Food Insecurity (03/17/2023)   Hunger Vital Sign    Worried About Running Out of Food in the Last Year: Never true    Ran Out of Food in the Last Year: Never true  Transportation Needs: No Transportation Needs (03/17/2023)   PRAPARE - Administrator, Civil Service (Medical): No    Lack of Transportation (Non-Medical): No  Physical Activity: Insufficiently Active (03/17/2023)   Exercise Vital Sign    Days of Exercise per Week: 1 day    Minutes of Exercise per Session: 20 min  Stress: Stress Concern Present (03/17/2023)   [employer] of Occupational Health - Occupational Stress Questionnaire    Feeling of Stress : To some extent  Social  Connections: Moderately Integrated (03/17/2023)   Social Connection and Isolation Panel [NHANES]    Frequency of Communication with Friends and Family: More than three times a week    Frequency of Social Gatherings with Friends and Family: More than three times a week    Attends Religious Services: More than 4 times per year    Active Member of [employer] or Organizations: Yes    Attends Banker Meetings: More than 4 times per year    Marital Status: Widowed   Family History  Problem Relation Age of Onset   Heart disease Mother        valve replacement   Sudden death Father 52   Heart disease Father 59       MI   Breast cancer Paternal Aunt    Diabetes Other        1st degree relative   Colon cancer Neg Hx    Stomach cancer Neg Hx    Esophageal cancer Neg Hx    Pancreatic cancer Neg Hx    Allergies  Allergen Reactions   Anesthetics, Amide Other (See Comments)    Pt states she had hallucinations and HTN after procedure 01/19/16-she feels may have been reaction to anesthetic   Lisinopril Cough   Prior to Admission medications   Medication Sig Start Date End Date Taking? Authorizing Provider  acetaminophen (TYLENOL) 500 MG tablet Take 1 tablet (500 mg total) by mouth 3 (three) times daily as needed. 10/10/23   Fletcher Anon, NP  amLODipine (NORVASC) 2.5 MG tablet Take 1 tablet (2.5 mg total) by mouth daily. 09/26/23 12/25/23  Jodelle Red, MD  b complex vitamins capsule Take 1 capsule by mouth daily.    [provider]  Cholecalciferol (VITAMIN D3) 125 MCG (5000 UT) CAPS Take 1 capsule (5,000 Units total) by mouth daily. 10/10/23   Fletcher Anon, NP  denosumab (PROLIA) 60 MG/ML SOSY injection Inject 60 mg into the skin every 6 (six) months.    [provider]  LORazepam (ATIVAN) 0.5 MG tablet Take 1 tablet (0.5 mg total) by mouth 2 (two) times daily as needed for up to 14 days for anxiety. 10/10/23 10/24/23  Fletcher Anon, NP  olmesartan (BENICAR)  20 MG tablet Take 1 tablet (20 mg total) by mouth 2 (two) times daily. 05/26/23   Alver Sorrow, NP  ondansetron (ZOFRAN) 4 MG tablet Take 1 tablet (4 mg total) by mouth every 8 (eight) hours as needed for nausea or vomiting. 10/10/23   Fletcher Anon, NP  oxyCODONE (ROXICODONE) 5 MG immediate release tablet Take 1 tablet (5 mg total) by mouth every 4 (four) hours as needed for up to 5 days for severe pain (pain score 7-10). 10/10/23 10/15/23  Fletcher Anon, NP  polyethylene glycol powder (GLYCOLAX/MIRALAX) 17 GM/SCOOP powder Take 17 g by mouth daily as needed. 10/10/23   Fletcher Anon, NP     Positive ROS: All other systems have been reviewed and were otherwise negative with the exception of those mentioned in the HPI and as above.  Physical Exam: General: Alert, no acute distress Cardiovascular: No pedal edema Respiratory: No cyanosis, no use of accessory musculature GI: No organomegaly, abdomen is soft and non-tender Skin: No lesions in the area of chief complaint Neurologic: Sensation intact distally Psychiatric: Patient is competent for consent with normal mood and affect Lymphatic: No axillary or cervical lymphadenopathy  MUSCULOSKELETAL: TTP left elbow, limited ROM tolerated, edema present, NVI   Imaging: There is a avulsion fracture of the left olecranon with mild displacement and associated soft tissue swelling and joint effusion   Assessment: LEFT OLECRANON FRACTURE,BURSITIS  Plan: Plan for Procedure(s): Open Reduction Internal fixation (ORIF), fracture, elbow/olecranon OLECRANON (ELBOW) BURSA  The risks benefits and alternatives were discussed with the patient including but not limited to the risks of nonoperative treatment, versus surgical intervention including infection, bleeding, nerve injury,  blood clots, cardiopulmonary complications, morbidity, mortality, among others, and they were willing to proceed.   Weightbearing: NWB LUE Orthopedic devices: sling  +/- splint Showering: POD 3 Dressing: reinforce PRN Medicines: ASA, Mobic, Baclofen bid PRN; already has Oxy, Tylenol and Zofran  Discharge: OBS Follow up: 10/31/23 at 10:45am    Mar[place]etta Board Office [phone] 10/13/2023 4:34 PM

## 2023-10-14 ENCOUNTER — Encounter (HOSPITAL_COMMUNITY): Payer: Self-pay | Admitting: Orthopedic Surgery

## 2023-10-14 ENCOUNTER — Encounter: Payer: Self-pay | Admitting: Orthopedic Surgery

## 2023-10-14 ENCOUNTER — Non-Acute Institutional Stay (SKILLED_NURSING_FACILITY): Payer: Self-pay | Admitting: Orthopedic Surgery

## 2023-10-14 DIAGNOSIS — K5904 Chronic idiopathic constipation: Secondary | ICD-10-CM | POA: Diagnosis not present

## 2023-10-14 DIAGNOSIS — I10 Essential (primary) hypertension: Secondary | ICD-10-CM

## 2023-10-14 DIAGNOSIS — R2689 Other abnormalities of gait and mobility: Secondary | ICD-10-CM | POA: Diagnosis not present

## 2023-10-14 DIAGNOSIS — S52022D Displaced fracture of olecranon process without intraarticular extension of left ulna, subsequent encounter for closed fracture with routine healing: Secondary | ICD-10-CM | POA: Diagnosis not present

## 2023-10-14 DIAGNOSIS — M25522 Pain in left elbow: Secondary | ICD-10-CM | POA: Diagnosis not present

## 2023-10-14 DIAGNOSIS — M6259 Muscle wasting and atrophy, not elsewhere classified, multiple sites: Secondary | ICD-10-CM | POA: Diagnosis not present

## 2023-10-14 DIAGNOSIS — S52022G Displaced fracture of olecranon process without intraarticular extension of left ulna, subsequent encounter for closed fracture with delayed healing: Secondary | ICD-10-CM

## 2023-10-14 DIAGNOSIS — M63812 Disorders of muscle in diseases classified elsewhere, left shoulder: Secondary | ICD-10-CM | POA: Diagnosis not present

## 2023-10-14 DIAGNOSIS — M25512 Pain in left shoulder: Secondary | ICD-10-CM | POA: Diagnosis not present

## 2023-10-14 MED ORDER — SENNA 8.6 MG PO TABS: 2.0000 | ORAL_TABLET | Freq: Every day | ORAL | Status: DC

## 2023-10-14 MED ORDER — POLYETHYLENE GLYCOL 3350 17 GM/SCOOP PO POWD: 17.0000 g | Freq: Two times a day (BID) | ORAL | Status: DC

## 2023-10-14 MED ORDER — ACETAMINOPHEN 500 MG PO TABS: 1000.0000 mg | ORAL_TABLET | Freq: Two times a day (BID) | ORAL | Status: DC

## 2023-10-14 MED ORDER — OXYCODONE HCL 5 MG PO TABS: 2.5000 mg | ORAL_TABLET | ORAL | Status: DC | PRN

## 2023-10-14 MED ORDER — ACETAMINOPHEN 500 MG PO TABS: 1000.0000 mg | ORAL_TABLET | Freq: Every day | ORAL | Status: DC | PRN

## 2023-10-14 NOTE — Progress Notes (Signed)
Left voicemail on the nurse line at [facility].

## 2023-10-14 NOTE — Progress Notes (Signed)
Location:  [facility] Room Number: 159/A Place of Service:  SNF (31) Provider:  Octavia Heir, NP   Plotnikov, Georgina Quint, MD  Patient Care Team: Tresa Garter, MD as PCP - General (Internal Medicine) Jodelle Red, MD as PCP - Cardiology (Cardiology) [place], Alisabeth Selkirk, MD (Dermatology) Webb, [place] Lowella Bandy., MD (Urology) Romie Levee, MD as Consulting Physician (General Surgery) Napoleon Form, MD as Consulting Physician (Gastroenterology) Melina Fiddler, MD as Consulting Physician (Sports Medicine)  Extended Emergency Contact Information Primary Emergency Contact: Brackins,Nick Mobile Phone: [phone] Relation: Son  Code Status:  Full code Goals of care: Advanced Directive information    03/12/2023    2:15 PM  Advanced Directives  Does Patient Have a Medical Advance Directive? Yes  Does patient want to make changes to medical advance directive? No - Patient declined     Chief Complaint  Patient presents with   Acute Visit    HPI:  Pt is a 82 y.o. female seen today for acute visit due to constipation.   She currently resides on the rehabilitation unit at [facility]. PMH: HTN, HLD, osteoporosis, diverticulosis, CKD, chronic constipation and unstable gait.   11/21 she got her walker caught in door and fell. She had increased left shoulder pain and swelling after event. ED evaluation confirmed avulsion fracture of olecranon. She was discharged to rehab at [facility]. Scheduled ORIF with Dr. Eulah Pont 12/03. Given oxycodone 5 mg prn for pain. She reports medication made her "loopy." She would like to try tylenol for pain.   H/o constipation she has not had bowel movement since week prior to fall. Normally averaged BM every 7-10 days. Nursing gave her miralax this morning without success. She is not drinking fluids well. Denies abdominal pain, N/V, diarrhea.   Morning SBP > 170. Remains on Benicar. Denies chest pain, sob,  blurred vision or headaches.   Past Medical History:  Diagnosis Date   Abdominal pain, unspecified site    ADVERSE REACTION TO MEDICATION 07/31/2009   ANEMIA-NOS 07/07/2007   Arthritis    Chest pain, unspecified    Chronic kidney disease    hx of kidney stone   COLONIC POLYPS, ADENOMATOUS, HX OF    Complication of anesthesia    Disturbance of skin sensation    DIVERTICULOSIS, COLON 02/23/2009   Family history of diabetes mellitus    Family history of malignant neoplasm of breast    FATIGUE 05/25/2009   Fever, unspecified    Heart murmur    hx of   HYPERLIPIDEMIA 07/07/2007   HYPERPARATHYROIDISM UNSPECIFIED 10/20/2007   HYPERTENSION 02/04/2008   Incomplete right bundle branch block (RBBB) determined by electrocardiography 04/15/2023   Irritable bowel syndrome (IBS)    NEPHROLITHIASIS, HX OF 11/15/2008   OSTEOPOROSIS 01/01/2008   Other acquired absence of organ    parathyroidectomy   Pain in limb    PALPITATIONS, OCCASIONAL 07/12/2008   PARATHYROIDECTOMY 01/02/2009   PONV (postoperative nausea and vomiting)    SCOLIOSIS 05/25/2009   Seasonal allergies    Unspecified constipation    URINARY INCONTINENCE 07/07/2007   UTI'S, RECURRENT 11/20/2009   kimbrough    Past Surgical History:  Procedure Laterality Date   BUNIONECTOMY     CATARACT EXTRACTION, BILATERAL     COLONOSCOPY     EXTRACORPOREAL SHOCK WAVE LITHOTRIPSY Left 12/21/2020   Procedure: EXTRACORPOREAL SHOCK WAVE LITHOTRIPSY (ESWL);  Surgeon: Jerilee Field, MD;  Location: [facility];  Service: Urology;  Laterality: Left;   LAPAROSCOPIC APPENDECTOMY  01/19/2016   Procedure: APPENDECTOMY LAPAROSCOPIC with orifice of cecal polyp;  Surgeon: Romie Levee, MD;  Location: [facility];  Service: General;;   LEFT HEART CATH AND CORONARY ANGIOGRAPHY N/A 04/07/2017   Procedure: Left Heart Cath and Coronary Angiography;  Surgeon: [name], MD;  Location: [facility];  Service: Cardiovascular;   Laterality: N/A;   PARATHYROIDECTOMY     Rt Superior open neck exploration   POLYPECTOMY     RIGHT OOPHORECTOMY     TONSILLECTOMY      Allergies  Allergen Reactions   Anesthetics, Amide Other (See Comments)    Pt states she had hallucinations and HTN after procedure 01/19/16-she feels may have been reaction to anesthetic   Lisinopril Cough    Outpatient Encounter Medications as of 10/14/2023  Medication Sig   acetaminophen (TYLENOL) 500 MG tablet Take 1 tablet (500 mg total) by mouth 3 (three) times daily as needed.   amLODipine (NORVASC) 2.5 MG tablet Take 1 tablet (2.5 mg total) by mouth daily.   b complex vitamins capsule Take 1 capsule by mouth daily.   Cholecalciferol (VITAMIN D3) 125 MCG (5000 UT) CAPS Take 1 capsule (5,000 Units total) by mouth daily.   denosumab (PROLIA) 60 MG/ML SOSY injection Inject 60 mg into the skin every 6 (six) months.   LORazepam (ATIVAN) 0.5 MG tablet Take 1 tablet (0.5 mg total) by mouth 2 (two) times daily as needed for up to 14 days for anxiety.   olmesartan (BENICAR) 20 MG tablet Take 1 tablet (20 mg total) by mouth 2 (two) times daily.   ondansetron (ZOFRAN) 4 MG tablet Take 1 tablet (4 mg total) by mouth every 8 (eight) hours as needed for nausea or vomiting.   oxyCODONE (ROXICODONE) 5 MG immediate release tablet Take 1 tablet (5 mg total) by mouth every 4 (four) hours as needed for up to 5 days for severe pain (pain score 7-10).   polyethylene glycol powder (GLYCOLAX/MIRALAX) 17 GM/SCOOP powder Take 17 g by mouth daily as needed.   No facility-administered encounter medications on file as of 10/14/2023.    Review of Systems  Constitutional:  Positive for activity change. Negative for appetite change and unexpected weight change.  HENT:  Negative for congestion.   Respiratory:  Negative for shortness of breath.   Cardiovascular:  Negative for chest pain and leg swelling.  Gastrointestinal:  Positive for abdominal distention and constipation.  Negative for abdominal pain, diarrhea, nausea and vomiting.  Genitourinary:  Negative for dysuria.  Musculoskeletal:  Positive for arthralgias and gait problem.  Skin:  Positive for wound.  Neurological:  Positive for weakness. Negative for dizziness and headaches.  Psychiatric/Behavioral:  Negative for dysphoric mood. The patient is not nervous/anxious.     Immunization History  Administered Date(s) Administered   Fluad Trivalent(High Dose 65+) 08/04/2023   Influenza Split 09/17/2011, 11/08/2015, 09/12/2017, 08/02/2020   Influenza Whole 08/19/2008   Influenza, High Dose Seasonal PF 08/20/2016, 09/12/2017, 08/21/2018, 09/28/2019, 10/12/2021, 09/01/2022   Influenza-Unspecified 09/18/2013, 08/18/2014, 11/08/2015, 09/15/2020   Moderna SARS-COV2 Booster Vaccination 07/03/2021   Moderna Sars-Covid-2 Vaccination 11/30/2019, 12/31/2019, 10/26/2020, 07/03/2021, 04/22/2022   Pneumococcal Conjugate-13 12/29/2013   Pneumococcal Polysaccharide-23 11/08/2015   Tdap 01/05/2015   Zoster Recombinant(Shingrix) 06/20/2021, 09/05/2021   Pertinent  Health Maintenance Due  Topic Date Due   INFLUENZA VACCINE  Completed   DEXA SCAN  Completed   Colonoscopy  Discontinued      03/17/2023   11:40 AM 03/18/2023    2:49 PM 03/31/2023  11:10 AM 04/23/2023   11:26 AM 08/04/2023    3:04 PM  Fall Risk  Falls in the past year? 1 1 0 0 0  Was there an injury with Fall? 0 1 0 0 0  Fall Risk Category Calculator 1 2 0 0 0  Patient at Risk for Falls Due to Impaired balance/gait;History of fall(s);Other (Comment) History of fall(s) No Fall Risks No Fall Risks No Fall Risks  Patient at Risk for Falls Due to - Comments history of fractures      Fall risk Follow up Falls prevention discussed;Education provided;Falls evaluation completed Falls evaluation completed Falls evaluation completed Falls evaluation completed Falls evaluation completed   Functional Status Survey:    Vitals:   10/14/23 1322  BP: (!) 177/81   Pulse: 60  Resp: 12  Temp: 97.9 F (36.6 C)  SpO2: 97%  Weight: 105 lb 12.8 oz (48 kg)  Height: 4\' 10"  (1.473 m)   Body mass index is 22.11 kg/m. Physical Exam Vitals reviewed.  Constitutional:      General: She is not in acute distress. HENT:     Head: Normocephalic.  Eyes:     General:        Right eye: No discharge.        Left eye: No discharge.  Cardiovascular:     Rate and Rhythm: Normal rate and regular rhythm.     Pulses: Normal pulses.     Heart sounds: Normal heart sounds.  Pulmonary:     Effort: Pulmonary effort is normal. No respiratory distress.     Breath sounds: Normal breath sounds. No wheezing.  Abdominal:     General: There is distension.     Palpations: There is no mass.     Tenderness: There is no abdominal tenderness. There is no guarding or rebound.     Hernia: No hernia is present.     Comments: LLQ and  RLQ hypoactive  Musculoskeletal:     Left shoulder: No swelling, tenderness or crepitus. Normal range of motion. Normal strength.     Left upper arm: Swelling, edema and tenderness present.     Left elbow: Swelling and deformity present. Decreased range of motion. Tenderness present.     Cervical back: Neck supple.     Right lower leg: No edema.     Left lower leg: No edema.     Comments: Left elbow NWB, sling on, wrist FROM, normal sensation, radial pulse 2+, cap refill < 3 sec  Skin:    General: Skin is warm.     Capillary Refill: Capillary refill takes less than 2 seconds.  Neurological:     General: No focal deficit present.     Mental Status: She is alert. Mental status is at baseline.     Motor: Weakness present.     Gait: Gait abnormal.     Comments: wheelchair  Psychiatric:        Mood and Affect: Mood normal.     Labs reviewed: Recent Labs    11/22/22 1137 03/18/23 1544  NA 141 142  K 3.9 4.1  CL 107 108  CO2 26 26  GLUCOSE 87 103*  BUN 17 18  CREATININE 0.77 0.81  CALCIUM 9.3 9.3   Recent Labs    11/22/22 1137  03/18/23 1544  AST 21 19  ALT 15 12  ALKPHOS 51 59  BILITOT 1.0 0.8  PROT 6.5 6.5  ALBUMIN 4.1 4.0   No results for input(s): "WBC", "NEUTROABS", "HGB", "  HCT", "MCV", "PLT" in the last 8760 hours. Lab Results  Component Value Date   TSH 1.20 03/18/2023   No results found for: "HGBA1C" Lab Results  Component Value Date   CHOL 132 01/25/2015   HDL 68 01/25/2015   LDLCALC 51 01/25/2015   LDLDIRECT 156.3 03/28/2011   TRIG 64 01/25/2015   CHOLHDL 1.9 01/25/2015    Significant Diagnostic Results in last 30 days:  CT Head Wo Contrast  Result Date: 10/09/2023 CLINICAL DATA:  Head trauma, fall. EXAM: CT HEAD WITHOUT CONTRAST TECHNIQUE: Contiguous axial images were obtained from the base of the skull through the vertex without intravenous contrast. RADIATION DOSE REDUCTION: This exam was performed according to the departmental dose-optimization program which includes automated exposure control, adjustment of the mA and/or kV according to patient size and/or use of iterative reconstruction technique. COMPARISON:  03/19/2023 FINDINGS: Brain: No intracranial hemorrhage, mass effect, or midline shift. Age related atrophy. No hydrocephalus. The basilar cisterns are patent. No evidence of territorial infarct or acute ischemia. No extra-axial or intracranial fluid collection. Vascular: Atherosclerosis of skullbase vasculature without hyperdense vessel or abnormal calcification. Skull: No fracture or focal lesion. Sinuses/Orbits: No acute findings. Frothy debris within the left sphenoid sinus, chronic. The visualized orbits are unremarkable. Bilateral cataract resection. Other: No large scalp hematoma. IMPRESSION: 1. No acute intracranial abnormality. No skull fracture. 2. Age related atrophy. Electronically Signed   By: Narda Rutherford M.D.   On: 10/09/2023 20:42   DG Shoulder Left Portable  Result Date: 10/09/2023 CLINICAL DATA:  Pain after fall EXAM: LEFT SHOULDER one view; LEFT ELBOW - COMPLETE  4 VIEW; LEFT HUMERUS - 2 VIEW; LEFT FOREARM - 2 VIEW COMPARISON:  None Available. FINDINGS: There is a avulsion fracture of the olecranon with mild displacement associated soft tissue swelling and joint effusion. No additional areas of fracture or dislocation. Preserved overall joint spaces including glenohumeral, AC joint, radiocarpal joint. Severe osteopenia. Overlapping splint. IMPRESSION: There is a avulsion fracture of the olecranon with mild displacement associated soft tissue swelling and joint effusion. Electronically Signed   By: Karen Kays M.D.   On: 10/09/2023 20:36   DG Elbow Complete Left  Result Date: 10/09/2023 CLINICAL DATA:  Pain after fall EXAM: LEFT SHOULDER one view; LEFT ELBOW - COMPLETE 4 VIEW; LEFT HUMERUS - 2 VIEW; LEFT FOREARM - 2 VIEW COMPARISON:  None Available. FINDINGS: There is a avulsion fracture of the olecranon with mild displacement associated soft tissue swelling and joint effusion. No additional areas of fracture or dislocation. Preserved overall joint spaces including glenohumeral, AC joint, radiocarpal joint. Severe osteopenia. Overlapping splint. IMPRESSION: There is a avulsion fracture of the olecranon with mild displacement associated soft tissue swelling and joint effusion. Electronically Signed   By: Karen Kays M.D.   On: 10/09/2023 20:36   DG Humerus Left  Result Date: 10/09/2023 CLINICAL DATA:  Pain after fall EXAM: LEFT SHOULDER one view; LEFT ELBOW - COMPLETE 4 VIEW; LEFT HUMERUS - 2 VIEW; LEFT FOREARM - 2 VIEW COMPARISON:  None Available. FINDINGS: There is a avulsion fracture of the olecranon with mild displacement associated soft tissue swelling and joint effusion. No additional areas of fracture or dislocation. Preserved overall joint spaces including glenohumeral, AC joint, radiocarpal joint. Severe osteopenia. Overlapping splint. IMPRESSION: There is a avulsion fracture of the olecranon with mild displacement associated soft tissue swelling and joint  effusion. Electronically Signed   By: Karen Kays M.D.   On: 10/09/2023 20:36   DG Forearm Left  Result  Date: 10/09/2023 CLINICAL DATA:  Pain after fall EXAM: LEFT SHOULDER one view; LEFT ELBOW - COMPLETE 4 VIEW; LEFT HUMERUS - 2 VIEW; LEFT FOREARM - 2 VIEW COMPARISON:  None Available. FINDINGS: There is a avulsion fracture of the olecranon with mild displacement associated soft tissue swelling and joint effusion. No additional areas of fracture or dislocation. Preserved overall joint spaces including glenohumeral, AC joint, radiocarpal joint. Severe osteopenia. Overlapping splint. IMPRESSION: There is a avulsion fracture of the olecranon with mild displacement associated soft tissue swelling and joint effusion. Electronically Signed   By: Karen Kays M.D.   On: 10/09/2023 20:36    Assessment/Plan 1. Olecranon fracture, left, closed, with delayed healing, subsequent encounter - 11/21 fall with walker - ED evaluation/ xray confirmed left olecranon fracture  - 12/03 scheduled ORIF with Dr. Eulah Pont - LUE NWB - cont sling  - oxycodone making her "loopy"  - start tylenol 1000 mg po BID - will reduce oxycodone to 2.5 mg po Q4hrs prn for breakthrough pain  2. Chronic idiopathic constipation - h/o chronic constipation - followed by GI - averaging BM every 7-10 - no BM since week prior to fall - abdomen distended, LLQ/RLQ hypoactive - no abd/back pain or N/V - start miralax po BID x 1 day - start senna 8.6 m- give 2 tablets po at bedtime - encourage hydration - encourage ambulation with nursing supervision  3. Essential hypertension - SBP > 170 in am - suspect due to left arm pain - cont Benicar    Family/ staff Communication: plan discussed with patient and nurse  Labs/tests ordered:  none

## 2023-10-15 ENCOUNTER — Other Ambulatory Visit: Payer: Self-pay | Admitting: Orthopedic Surgery

## 2023-10-15 DIAGNOSIS — R2689 Other abnormalities of gait and mobility: Secondary | ICD-10-CM | POA: Diagnosis not present

## 2023-10-15 DIAGNOSIS — M25522 Pain in left elbow: Secondary | ICD-10-CM | POA: Diagnosis not present

## 2023-10-15 DIAGNOSIS — S52022D Displaced fracture of olecranon process without intraarticular extension of left ulna, subsequent encounter for closed fracture with routine healing: Secondary | ICD-10-CM | POA: Diagnosis not present

## 2023-10-15 DIAGNOSIS — M6259 Muscle wasting and atrophy, not elsewhere classified, multiple sites: Secondary | ICD-10-CM | POA: Diagnosis not present

## 2023-10-15 DIAGNOSIS — S52022G Displaced fracture of olecranon process without intraarticular extension of left ulna, subsequent encounter for closed fracture with delayed healing: Secondary | ICD-10-CM

## 2023-10-15 DIAGNOSIS — M25512 Pain in left shoulder: Secondary | ICD-10-CM | POA: Diagnosis not present

## 2023-10-15 DIAGNOSIS — M63812 Disorders of muscle in diseases classified elsewhere, left shoulder: Secondary | ICD-10-CM | POA: Diagnosis not present

## 2023-10-15 MED ORDER — OXYCODONE HCL 5 MG PO TABS: 2.5000 mg | ORAL_TABLET | ORAL | 0 refills | Status: DC | PRN

## 2023-10-15 MED ORDER — OXYCODONE HCL 5 MG PO TABS: 2.5000 mg | ORAL_TABLET | ORAL | Status: DC | PRN

## 2023-10-16 DIAGNOSIS — M63812 Disorders of muscle in diseases classified elsewhere, left shoulder: Secondary | ICD-10-CM | POA: Diagnosis not present

## 2023-10-16 DIAGNOSIS — S52022D Displaced fracture of olecranon process without intraarticular extension of left ulna, subsequent encounter for closed fracture with routine healing: Secondary | ICD-10-CM | POA: Diagnosis not present

## 2023-10-16 DIAGNOSIS — M25512 Pain in left shoulder: Secondary | ICD-10-CM | POA: Diagnosis not present

## 2023-10-16 DIAGNOSIS — M6259 Muscle wasting and atrophy, not elsewhere classified, multiple sites: Secondary | ICD-10-CM | POA: Diagnosis not present

## 2023-10-16 DIAGNOSIS — R2689 Other abnormalities of gait and mobility: Secondary | ICD-10-CM | POA: Diagnosis not present

## 2023-10-16 DIAGNOSIS — M25522 Pain in left elbow: Secondary | ICD-10-CM | POA: Diagnosis not present

## 2023-10-17 DIAGNOSIS — R2689 Other abnormalities of gait and mobility: Secondary | ICD-10-CM | POA: Diagnosis not present

## 2023-10-17 DIAGNOSIS — M25512 Pain in left shoulder: Secondary | ICD-10-CM | POA: Diagnosis not present

## 2023-10-17 DIAGNOSIS — M25522 Pain in left elbow: Secondary | ICD-10-CM | POA: Diagnosis not present

## 2023-10-17 DIAGNOSIS — M6259 Muscle wasting and atrophy, not elsewhere classified, multiple sites: Secondary | ICD-10-CM | POA: Diagnosis not present

## 2023-10-17 DIAGNOSIS — S52022D Displaced fracture of olecranon process without intraarticular extension of left ulna, subsequent encounter for closed fracture with routine healing: Secondary | ICD-10-CM | POA: Diagnosis not present

## 2023-10-17 DIAGNOSIS — M63812 Disorders of muscle in diseases classified elsewhere, left shoulder: Secondary | ICD-10-CM | POA: Diagnosis not present

## 2023-10-18 DIAGNOSIS — M25522 Pain in left elbow: Secondary | ICD-10-CM | POA: Diagnosis not present

## 2023-10-18 DIAGNOSIS — M63812 Disorders of muscle in diseases classified elsewhere, left shoulder: Secondary | ICD-10-CM | POA: Diagnosis not present

## 2023-10-18 DIAGNOSIS — M25512 Pain in left shoulder: Secondary | ICD-10-CM | POA: Diagnosis not present

## 2023-10-18 DIAGNOSIS — M6259 Muscle wasting and atrophy, not elsewhere classified, multiple sites: Secondary | ICD-10-CM | POA: Diagnosis not present

## 2023-10-18 DIAGNOSIS — R2689 Other abnormalities of gait and mobility: Secondary | ICD-10-CM | POA: Diagnosis not present

## 2023-10-18 DIAGNOSIS — S52022D Displaced fracture of olecranon process without intraarticular extension of left ulna, subsequent encounter for closed fracture with routine healing: Secondary | ICD-10-CM | POA: Diagnosis not present

## 2023-10-20 ENCOUNTER — Non-Acute Institutional Stay (INDEPENDENT_AMBULATORY_CARE_PROVIDER_SITE_OTHER): Payer: Self-pay | Admitting: Internal Medicine

## 2023-10-20 ENCOUNTER — Encounter (HOSPITAL_COMMUNITY): Payer: Self-pay | Admitting: Orthopedic Surgery

## 2023-10-20 ENCOUNTER — Other Ambulatory Visit: Payer: Self-pay | Admitting: Adult Health

## 2023-10-20 ENCOUNTER — Encounter: Payer: Self-pay | Admitting: Internal Medicine

## 2023-10-20 DIAGNOSIS — S52022D Displaced fracture of olecranon process without intraarticular extension of left ulna, subsequent encounter for closed fracture with routine healing: Secondary | ICD-10-CM | POA: Diagnosis not present

## 2023-10-20 DIAGNOSIS — R4189 Other symptoms and signs involving cognitive functions and awareness: Secondary | ICD-10-CM | POA: Diagnosis not present

## 2023-10-20 DIAGNOSIS — S52022G Displaced fracture of olecranon process without intraarticular extension of left ulna, subsequent encounter for closed fracture with delayed healing: Secondary | ICD-10-CM

## 2023-10-20 DIAGNOSIS — I10 Essential (primary) hypertension: Secondary | ICD-10-CM | POA: Diagnosis not present

## 2023-10-20 DIAGNOSIS — Z9181 History of falling: Secondary | ICD-10-CM | POA: Diagnosis not present

## 2023-10-20 DIAGNOSIS — M63832 Disorders of muscle in diseases classified elsewhere, left forearm: Secondary | ICD-10-CM | POA: Diagnosis not present

## 2023-10-20 DIAGNOSIS — M25522 Pain in left elbow: Secondary | ICD-10-CM | POA: Diagnosis not present

## 2023-10-20 DIAGNOSIS — M810 Age-related osteoporosis without current pathological fracture: Secondary | ICD-10-CM

## 2023-10-20 DIAGNOSIS — M63812 Disorders of muscle in diseases classified elsewhere, left shoulder: Secondary | ICD-10-CM | POA: Diagnosis not present

## 2023-10-20 DIAGNOSIS — K5904 Chronic idiopathic constipation: Secondary | ICD-10-CM | POA: Diagnosis not present

## 2023-10-20 DIAGNOSIS — R600 Localized edema: Secondary | ICD-10-CM | POA: Diagnosis not present

## 2023-10-20 DIAGNOSIS — R2689 Other abnormalities of gait and mobility: Secondary | ICD-10-CM | POA: Diagnosis not present

## 2023-10-20 DIAGNOSIS — R278 Other lack of coordination: Secondary | ICD-10-CM | POA: Diagnosis not present

## 2023-10-20 DIAGNOSIS — M6259 Muscle wasting and atrophy, not elsewhere classified, multiple sites: Secondary | ICD-10-CM | POA: Diagnosis not present

## 2023-10-20 DIAGNOSIS — S43492D Other sprain of left shoulder joint, subsequent encounter: Secondary | ICD-10-CM | POA: Diagnosis not present

## 2023-10-20 DIAGNOSIS — M25512 Pain in left shoulder: Secondary | ICD-10-CM | POA: Diagnosis not present

## 2023-10-20 MED ORDER — OXYCODONE HCL 5 MG PO TABS: 2.5000 mg | ORAL_TABLET | ORAL | 0 refills | Status: AC | PRN

## 2023-10-20 NOTE — Telephone Encounter (Signed)
   Primary Cardiologist: Jodelle Red, MD   Contacted patient to discuss upcoming procedure. She fell on 10/09/23 when she tripped over her walker. She denies syncope. Admits that she trips often due to imbalance and "clumsiness."  She reports BP continues to be labile. She had some high BP readings over the past few days 180s-190s systolic and she was treated with hydralazine per [facility] protocol. BP improved to 106/67 mmHg, however the pt feels poorly when BP is low.    Chart reviewed as part of pre-operative protocol coverage. Given past medical history and time since last visit, based on ACC/AHA guidelines, [name] would be at acceptable risk for the planned procedure without further cardiovascular testing. Recommendation to increase amlodipine to 5 mg tomorrow morning, prior to surgery, if SBP > 160 mmHg.   Patient was advised that if she develops new symptoms prior to surgery to contact our office to arrange a follow-up appointment. She verbalized understanding.  I will route this recommendation to the requesting party via Epic fax function and remove from pre-op pool.  Please call with questions.  Levi Aland, NP-C 10/20/2023, 12:55 PM [address] Office [phone] Fax [phone]

## 2023-10-20 NOTE — Progress Notes (Signed)
Preop instructions for:     [name] Date of Birth:  [birth year]                     Date of Procedure:   10-21-23 Procedure:    Open reduction and internal fixation of left elbow/olecranon  Surgeon: Dr. Margarita Rana Facility contact:  [name]   Phone:    [phone]  Fax #:  [phone]           Health Care POA: Gearldine Shown and Vivi Ferns              Transportation contact phone#: [employer] [phone]  Please send day of procedure:  Current med list  Medications taken the day of procedure (return attached form to hospital) confirm time of nothing by mouth status (return attached form to hospital) Patient Demographic info( to include DNR status, problem list, allergies) Bring Insurance card and picture ID    Time to arrive at [facility]:  6:15 am   Report to: Admitting (On your left hand side)    Do not eat solid food past midnight the night before your procedure.(To include any tube feedings-must be discontinued)  May have the following until 6:00 AM day of procedure  CLEAR LIQUID DIET  Water Black Coffee (sugar ok, NO MILK/CREAM OR CREAMERS)  Tea (sugar ok, NO MILK/CREAM OR CREAMERS) regular and decaf                             Plain Jell-O (NO RED)                                           Fruit ices (not with fruit pulp, NO RED)                                     Popsicles (NO RED)                                                                  Juice: apple, WHITE grape, WHITE cranberry Sports drinks like Gatorade (NO RED)   Take these morning medications only with sips of water.(or give through gastrostomy or feeding tube).  Amlodipine.  If needed Oxycodone, Ondansetron, Ativan, Tylenol  Stop all vitamins and herbal supplements 7 days before surgery.   Note: No Insulin or Diabetic meds should be given or taken the morning of the procedure!  Oral Hygiene is also important to reduce your risk of infection.                                     Remember - BRUSH YOUR TEETH THE MORNING OF SURGERY WITH YOUR REGULAR TOOTHPASTE   DENTURES WILL BE REMOVED PRIOR TO SURGERY PLEASE DO NOT APPLY "Poly grip" OR ADHESIVES!!!  Leave all jewelry and other valuables at place where living( no metal or rings to be worn) No contact lens Women-no make-up, no lotions,perfumes,powders Men-no colognes,lotions  Any questions day of procedure,call  SHORT STAY-[phone]     Sent from :[facility]                   [phone]                   Fax:[phone]   Sent by :   Derek Mound, RN

## 2023-10-20 NOTE — Progress Notes (Signed)
[name] left a message that she did receive preop instructions for patient scheduled for surgery 10-21-23.

## 2023-10-20 NOTE — Progress Notes (Signed)
Anesthesia Chart Review   Case: [id number] Date/Time: 10/21/23 0857   Procedures:      Open Reduction Internal fixation (ORIF), fracture, elbow/olecranon (Left: Elbow)     OLECRANON (ELBOW) BURSA (Left: Elbow)   Anesthesia type: Choice   Pre-op diagnosis: LEFT OLECRANON FRACTURE,BURSITIS   Location: [facility] ROOM 07 / [facility]   Surgeons: Sheral Apley, MD       DISCUSSION:82 y.o. never smoker with h/o PONV, HTN, nonobstructive CAD on cath 2018, bradycardia, left olecranon fracture scheduled for above procedure 10/21/2023 with Dr. Margarita Rana.   Pt last seen by cardiology 09/26/2023. Pt with labile HTN, low dose amlodipine started.   Per cardiology preoperative evaluation, "Contacted patient to discuss upcoming procedure. She fell on 10/09/23 when she tripped over her walker. She denies syncope. Admits that she trips often due to imbalance and "clumsiness."  She reports BP continues to be labile. She had some high BP readings over the past few days 180s-190s systolic and she was treated with hydralazine per [facility] protocol. BP improved to 106/67 mmHg, however the pt feels poorly when BP is low.    Chart reviewed as part of pre-operative protocol coverage. Given past medical history and time since last visit, based on ACC/AHA guidelines, NISHKA SALINAS would be at acceptable risk for the planned procedure without further cardiovascular testing. Recommendation to increase amlodipine to 5 mg tomorrow morning, prior to surgery, if SBP > 160 mmHg."  VS: There were no vitals taken for this visit.  PROVIDERS: Plotnikov, Georgina Quint, MD is PCP   Jodelle Red, MD is Cardiologist  LABS: Labs reviewed: Acceptable for surgery. (all labs ordered are listed, but only abnormal results are displayed)  Labs Reviewed - No data to display   IMAGES:   EKG:   CV: Echo 04/17/2021  1. Left ventricular ejection fraction, by estimation, is 60 to 65%. The  left ventricle has normal  function. The left ventricle has no regional  wall motion abnormalities. There is severe asymmetric left ventricular  hypertrophy of the basal-septal  segment. Left ventricular diastolic parameters are consistent with Grade I  diastolic dysfunction (impaired relaxation).   2. Right ventricular systolic function is normal. The right ventricular  size is normal. There is normal pulmonary artery systolic pressure. The  estimated right ventricular systolic pressure is 32.4 mmHg.   3. Left atrial size was mildly dilated.   4. The mitral valve is abnormal. Trivial mitral valve regurgitation.   5. The aortic valve is tricuspid. Aortic valve regurgitation is mild.  Mild to moderate aortic valve sclerosis/calcification is present, without  any evidence of aortic stenosis. Aortic regurgitation PHT measures 517  msec.   6. The inferior vena cava is dilated in size with >50% respiratory  variability, suggesting right atrial pressure of 8 mmHg.   Comparison(s): Changes from prior study are noted. 01/22/2016: LVEF 55-60%,  mild LAE, RVSP 49 mmHg.   Cardiac Cath 04/07/2017 Conclusions: Minimal coronary artery disease involving the large epicardial coronary arteries. Mild to moderate stenosis of diagonal branches is evident. Normal left ventricular contraction. Normal left ventricular systolic function. Small right radial artery.   Recommendations: Continue medical therapy and risk factor modification. Past Medical History:  Diagnosis Date   Abdominal pain, unspecified site    ADVERSE REACTION TO MEDICATION 07/31/2009   ANEMIA-NOS 07/07/2007   Anxiety    Arthritis    Chest pain, unspecified    Chronic kidney disease    hx of kidney stone   COLONIC POLYPS,  ADENOMATOUS, HX OF    Complication of anesthesia    Dementia (HCC)    Disturbance of skin sensation    DIVERTICULOSIS, COLON 02/23/2009   Dyspnea    Family history of diabetes mellitus    Family history of malignant neoplasm of breast     FATIGUE 05/25/2009   Fever, unspecified    Heart murmur    hx of   HYPERLIPIDEMIA 07/07/2007   HYPERPARATHYROIDISM UNSPECIFIED 10/20/2007   HYPERTENSION 02/04/2008   Incomplete right bundle branch block (RBBB) determined by electrocardiography 04/15/2023   Irritable bowel syndrome (IBS)    NEPHROLITHIASIS, HX OF 11/15/2008   OSTEOPOROSIS 01/01/2008   Other acquired absence of organ    parathyroidectomy   Pain in limb    PALPITATIONS, OCCASIONAL 07/12/2008   PARATHYROIDECTOMY 01/02/2009   PONV (postoperative nausea and vomiting)    SCOLIOSIS 05/25/2009   Seasonal allergies    Unspecified constipation    URINARY INCONTINENCE 07/07/2007   UTI'S, RECURRENT 11/20/2009   kimbrough     Past Surgical History:  Procedure Laterality Date   BUNIONECTOMY     CATARACT EXTRACTION, BILATERAL     COLONOSCOPY     EXTRACORPOREAL SHOCK WAVE LITHOTRIPSY Left 12/21/2020   Procedure: EXTRACORPOREAL SHOCK WAVE LITHOTRIPSY (ESWL);  Surgeon: Jerilee Field, MD;  Location: [facility];  Service: Urology;  Laterality: Left;   LAPAROSCOPIC APPENDECTOMY  01/19/2016   Procedure: APPENDECTOMY LAPAROSCOPIC with orifice of cecal polyp;  Surgeon: Romie Levee, MD;  Location: [facility];  Service: General;;   LEFT HEART CATH AND CORONARY ANGIOGRAPHY N/A 04/07/2017   Procedure: Left Heart Cath and Coronary Angiography;  Surgeon: [name], MD;  [facility];  Service: Cardiovascular;  Laterality: N/A;   PARATHYROIDECTOMY     Rt Superior open neck exploration   POLYPECTOMY     RIGHT OOPHORECTOMY     TONSILLECTOMY      MEDICATIONS: No current facility-administered medications for this encounter.    acetaminophen (TYLENOL) 500 MG tablet   acetaminophen (TYLENOL) 500 MG tablet   amLODipine (NORVASC) 2.5 MG tablet   b complex vitamins capsule   Cholecalciferol (VITAMIN D3) 125 MCG (5000 UT) CAPS   LORazepam (ATIVAN) 0.5 MG tablet   olmesartan (BENICAR) 20 MG tablet    ondansetron (ZOFRAN-ODT) 4 MG disintegrating tablet   polyethylene glycol (MIRALAX / GLYCOLAX) 17 g packet   senna (SENOKOT) 8.6 MG TABS tablet   amLODipine (NORVASC) 5 MG tablet   denosumab (PROLIA) 60 MG/ML SOSY injection   oxyCODONE (ROXICODONE) 5 MG immediate release tablet     Jodell Cipro Ward, PA-C [facility] [phone]

## 2023-10-20 NOTE — Addendum Note (Signed)
Addended by: Levi Aland on: 10/20/2023 01:11 PM   Modules accepted: Orders

## 2023-10-20 NOTE — Telephone Encounter (Signed)
I will forward to pre op APP to review.

## 2023-10-20 NOTE — Progress Notes (Unsigned)
Provider:   Location:  [facility] Room Number: 159A Place of Service:  SNF (31)  PCP: Plotnikov, Georgina Quint, MD Patient Care Team: Tresa Garter, MD as PCP - General (Internal Medicine) Jodelle Red, MD as PCP - Cardiology (Cardiology) [place], Amy, MD (Dermatology) [place], [place] Lowella Bandy., MD (Urology) Romie Levee, MD as Consulting Physician (General Surgery) Napoleon Form, MD as Consulting Physician (Gastroenterology) Melina Fiddler, MD as Consulting Physician (Sports Medicine)  Extended Emergency Contact Information Primary Emergency Contact: Searles,Nick Mobile Phone: [phone] Relation: Son  Code Status: DNR Goals of Care: Advanced Directive information    10/20/2023   10:06 AM  Advanced Directives  Does Patient Have a Medical Advance Directive? Yes  Type of Advance Directive Living will;Out of facility DNR (pink MOST or yellow form)  Does patient want to make changes to medical advance directive? No - Patient declined      Chief Complaint  Patient presents with   New Admit To SNF    Patient is being seen for a New Admission   Immunizations    Patient is due for a covid vaccine     HPI: Patient is a 82 y.o. female seen today for admission to  Past Medical History:  Diagnosis Date   Abdominal pain, unspecified site    ADVERSE REACTION TO MEDICATION 07/31/2009   ANEMIA-NOS 07/07/2007   Anxiety    Arthritis    Chest pain, unspecified    Chronic kidney disease    hx of kidney stone   COLONIC POLYPS, ADENOMATOUS, HX OF    Complication of anesthesia    Dementia (HCC)    Disturbance of skin sensation    DIVERTICULOSIS, COLON 02/23/2009   Dyspnea    Family history of diabetes mellitus    Family history of malignant neoplasm of breast    FATIGUE 05/25/2009   Fever, unspecified    Heart murmur    hx of   HYPERLIPIDEMIA 07/07/2007   HYPERPARATHYROIDISM UNSPECIFIED 10/20/2007   HYPERTENSION  02/04/2008   Incomplete right bundle branch block (RBBB) determined by electrocardiography 04/15/2023   Irritable bowel syndrome (IBS)    NEPHROLITHIASIS, HX OF 11/15/2008   OSTEOPOROSIS 01/01/2008   Other acquired absence of organ    parathyroidectomy   Pain in limb    PALPITATIONS, OCCASIONAL 07/12/2008   PARATHYROIDECTOMY 01/02/2009   PONV (postoperative nausea and vomiting)    SCOLIOSIS 05/25/2009   Seasonal allergies    Unspecified constipation    URINARY INCONTINENCE 07/07/2007   UTI'S, RECURRENT 11/20/2009   kimbrough    Past Surgical History:  Procedure Laterality Date   BUNIONECTOMY     CATARACT EXTRACTION, BILATERAL     COLONOSCOPY     EXTRACORPOREAL SHOCK WAVE LITHOTRIPSY Left 12/21/2020   Procedure: EXTRACORPOREAL SHOCK WAVE LITHOTRIPSY (ESWL);  Surgeon: Jerilee Field, MD;  Location: [facility];  Service: Urology;  Laterality: Left;   LAPAROSCOPIC APPENDECTOMY  01/19/2016   Procedure: APPENDECTOMY LAPAROSCOPIC with orifice of cecal polyp;  Surgeon: Romie Levee, MD;  Location[facility];  Service: General;;   LEFT HEART CATH AND CORONARY ANGIOGRAPHY N/A 04/07/2017   Procedure: Left Heart Cath and Coronary Angiography;  Surgeon: [name], MD;  [facility];  Service: Cardiovascular;  Laterality: N/A;   PARATHYROIDECTOMY     Rt Superior open neck exploration   POLYPECTOMY     RIGHT OOPHORECTOMY     TONSILLECTOMY      reports that she has never smoked. She  has never used smokeless tobacco. She reports current alcohol use. She reports that she does not use drugs. Social History   Socioeconomic History   Marital status: Widowed    Spouse name: Not on file   Number of children: 3   Years of education: Not on file   Highest education level: Not on file  Occupational History   Occupation: travel agent    Employer: RETIRED  Tobacco Use   Smoking status: Never   Smokeless tobacco: Never  Vaping Use   Vaping status: Never Used   Substance and Sexual Activity   Alcohol use: Yes    Comment: 1 glass of wine per day   Drug use: No   Sexual activity: Never  Other Topics Concern   Not on file  Social History Narrative       2-3 c coffee in am    ocass wine    G3P2vaginal delivery   Pt cell [phone]      Patient walks daily for exercise.    Social Determinants of Health   Financial Resource Strain: Low Risk  (03/17/2023)   Overall Financial Resource Strain (CARDIA)    Difficulty of Paying Living Expenses: Not very hard  Food Insecurity: No Food Insecurity (03/17/2023)   Hunger Vital Sign    Worried About Running Out of Food in the Last Year: Never true    Ran Out of Food in the Last Year: Never true  Transportation Needs: No Transportation Needs (03/17/2023)   PRAPARE - Administrator, Civil Service (Medical): No    Lack of Transportation (Non-Medical): No  Physical Activity: Insufficiently Active (03/17/2023)   Exercise Vital Sign    Days of Exercise per Week: 1 day    Minutes of Exercise per Session: 20 min  Stress: Stress Concern Present (03/17/2023)   [employer] of Occupational Health - Occupational Stress Questionnaire    Feeling of Stress : To some extent  Social Connections: Moderately Integrated (03/17/2023)   Social Connection and Isolation Panel [NHANES]    Frequency of Communication with Friends and Family: More than three times a week    Frequency of Social Gatherings with Friends and Family: More than three times a week    Attends Religious Services: More than 4 times per year    Active Member of [employer] or Organizations: Yes    Attends Banker Meetings: More than 4 times per year    Marital Status: Widowed  Intimate Partner Violence: Not At Risk (03/17/2023)   Humiliation, Afraid, Rape, and Kick questionnaire    Fear of Current or Ex-Partner: No    Emotionally Abused: No    Physically Abused: No    Sexually Abused: No    Functional Status Survey:    Family  History  Problem Relation Age of Onset   Heart disease Mother        valve replacement   Sudden death Father 75   Heart disease Father 38       MI   Breast cancer Paternal Aunt    Diabetes Other        1st degree relative   Colon cancer Neg Hx    Stomach cancer Neg Hx    Esophageal cancer Neg Hx    Pancreatic cancer Neg Hx     Health Maintenance  Topic Date Due   COVID-19 Vaccine (7 - 2023-24 season) 07/20/2023   Medicare Annual Wellness (AWV)  03/16/2024   DTaP/Tdap/Td (2 - Td or  Tdap) 01/05/2025   Pneumonia Vaccine 82+ Years old  Completed   INFLUENZA VACCINE  Completed   DEXA SCAN  Completed   Zoster Vaccines- Shingrix  Completed   HPV VACCINES  Aged Out   Colonoscopy  Discontinued    Allergies  Allergen Reactions   Anesthetics, Amide Other (See Comments)    Pt states she had hallucinations and HTN after procedure 01/19/16-she feels may have been reaction to anesthetic   Lisinopril Cough    Outpatient Encounter Medications as of 10/20/2023  Medication Sig   acetaminophen (TYLENOL) 500 MG tablet Take 2 tablets (1,000 mg total) by mouth 2 (two) times daily.   acetaminophen (TYLENOL) 500 MG tablet Take 2 tablets (1,000 mg total) by mouth daily as needed.   amLODipine (NORVASC) 2.5 MG tablet Take 1 tablet (2.5 mg total) by mouth daily.   b complex vitamins capsule Take 1 capsule by mouth daily.   Cholecalciferol (VITAMIN D3) 125 MCG (5000 UT) CAPS Take 1 capsule (5,000 Units total) by mouth daily.   denosumab (PROLIA) 60 MG/ML SOSY injection Inject 60 mg into the skin every 6 (six) months.   LORazepam (ATIVAN) 0.5 MG tablet Take 1 tablet (0.5 mg total) by mouth 2 (two) times daily as needed for up to 14 days for anxiety.   olmesartan (BENICAR) 20 MG tablet Take 1 tablet (20 mg total) by mouth 2 (two) times daily.   ondansetron (ZOFRAN-ODT) 4 MG disintegrating tablet Take 4 mg by mouth every 8 (eight) hours as needed for nausea or vomiting.   oxyCODONE (ROXICODONE) 5 MG  immediate release tablet Take 0.5 tablets (2.5 mg total) by mouth every 4 (four) hours as needed for up to 14 days for severe pain (pain score 7-10).   polyethylene glycol (MIRALAX / GLYCOLAX) 17 g packet Take 17 g by mouth daily as needed for mild constipation.   senna (SENOKOT) 8.6 MG TABS tablet Take 2 tablets (17.2 mg total) by mouth at bedtime.   No facility-administered encounter medications on file as of 10/20/2023.    Review of Systems  Vitals:   10/20/23 1000  BP: (!) 181/83  Pulse: 61  Resp: 14  Temp: 98.6 F (37 C)  TempSrc: Temporal  SpO2: 97%  Weight: 110 lb 9.6 oz (50.2 kg)  Height: 4\' 10"  (1.473 m)   Body mass index is 23.12 kg/m. Physical Exam  Labs reviewed: Basic Metabolic Panel: Recent Labs    11/22/22 1137 03/18/23 1544  NA 141 142  K 3.9 4.1  CL 107 108  CO2 26 26  GLUCOSE 87 103*  BUN 17 18  CREATININE 0.77 0.81  CALCIUM 9.3 9.3   Liver Function Tests: Recent Labs    11/22/22 1137 03/18/23 1544  AST 21 19  ALT 15 12  ALKPHOS 51 59  BILITOT 1.0 0.8  PROT 6.5 6.5  ALBUMIN 4.1 4.0   No results for input(s): "LIPASE", "AMYLASE" in the last 8760 hours. No results for input(s): "AMMONIA" in the last 8760 hours. CBC: No results for input(s): "WBC", "NEUTROABS", "HGB", "HCT", "MCV", "PLT" in the last 8760 hours. Cardiac Enzymes: No results for input(s): "CKTOTAL", "CKMB", "CKMBINDEX", "TROPONINI" in the last 8760 hours. BNP: Invalid input(s): "POCBNP" No results found for: "HGBA1C" Lab Results  Component Value Date   TSH 1.20 03/18/2023   Lab Results  Component Value Date   VITAMINB12 589 03/18/2023   Lab Results  Component Value Date   FOLATE 8.0 05/25/2009   No results found for: "IRON", "TIBC", "  FERRITIN"  Imaging and Procedures obtained prior to SNF admission: CT Head Wo Contrast  Result Date: 10/09/2023 CLINICAL DATA:  Head trauma, fall. EXAM: CT HEAD WITHOUT CONTRAST TECHNIQUE: Contiguous axial images were obtained from  the base of the skull through the vertex without intravenous contrast. RADIATION DOSE REDUCTION: This exam was performed according to the departmental dose-optimization program which includes automated exposure control, adjustment of the mA and/or kV according to patient size and/or use of iterative reconstruction technique. COMPARISON:  03/19/2023 FINDINGS: Brain: No intracranial hemorrhage, mass effect, or midline shift. Age related atrophy. No hydrocephalus. The basilar cisterns are patent. No evidence of territorial infarct or acute ischemia. No extra-axial or intracranial fluid collection. Vascular: Atherosclerosis of skullbase vasculature without hyperdense vessel or abnormal calcification. Skull: No fracture or focal lesion. Sinuses/Orbits: No acute findings. Frothy debris within the left sphenoid sinus, chronic. The visualized orbits are unremarkable. Bilateral cataract resection. Other: No large scalp hematoma. IMPRESSION: 1. No acute intracranial abnormality. No skull fracture. 2. Age related atrophy. Electronically Signed   By: Narda Rutherford M.D.   On: 10/09/2023 20:42   DG Shoulder Left Portable  Result Date: 10/09/2023 CLINICAL DATA:  Pain after fall EXAM: LEFT SHOULDER one view; LEFT ELBOW - COMPLETE 4 VIEW; LEFT HUMERUS - 2 VIEW; LEFT FOREARM - 2 VIEW COMPARISON:  None Available. FINDINGS: There is a avulsion fracture of the olecranon with mild displacement associated soft tissue swelling and joint effusion. No additional areas of fracture or dislocation. Preserved overall joint spaces including glenohumeral, AC joint, radiocarpal joint. Severe osteopenia. Overlapping splint. IMPRESSION: There is a avulsion fracture of the olecranon with mild displacement associated soft tissue swelling and joint effusion. Electronically Signed   By: Karen Kays M.D.   On: 10/09/2023 20:36   DG Elbow Complete Left  Result Date: 10/09/2023 CLINICAL DATA:  Pain after fall EXAM: LEFT SHOULDER one view; LEFT  ELBOW - COMPLETE 4 VIEW; LEFT HUMERUS - 2 VIEW; LEFT FOREARM - 2 VIEW COMPARISON:  None Available. FINDINGS: There is a avulsion fracture of the olecranon with mild displacement associated soft tissue swelling and joint effusion. No additional areas of fracture or dislocation. Preserved overall joint spaces including glenohumeral, AC joint, radiocarpal joint. Severe osteopenia. Overlapping splint. IMPRESSION: There is a avulsion fracture of the olecranon with mild displacement associated soft tissue swelling and joint effusion. Electronically Signed   By: Karen Kays M.D.   On: 10/09/2023 20:36   DG Humerus Left  Result Date: 10/09/2023 CLINICAL DATA:  Pain after fall EXAM: LEFT SHOULDER one view; LEFT ELBOW - COMPLETE 4 VIEW; LEFT HUMERUS - 2 VIEW; LEFT FOREARM - 2 VIEW COMPARISON:  None Available. FINDINGS: There is a avulsion fracture of the olecranon with mild displacement associated soft tissue swelling and joint effusion. No additional areas of fracture or dislocation. Preserved overall joint spaces including glenohumeral, AC joint, radiocarpal joint. Severe osteopenia. Overlapping splint. IMPRESSION: There is a avulsion fracture of the olecranon with mild displacement associated soft tissue swelling and joint effusion. Electronically Signed   By: Karen Kays M.D.   On: 10/09/2023 20:36   DG Forearm Left  Result Date: 10/09/2023 CLINICAL DATA:  Pain after fall EXAM: LEFT SHOULDER one view; LEFT ELBOW - COMPLETE 4 VIEW; LEFT HUMERUS - 2 VIEW; LEFT FOREARM - 2 VIEW COMPARISON:  None Available. FINDINGS: There is a avulsion fracture of the olecranon with mild displacement associated soft tissue swelling and joint effusion. No additional areas of fracture or dislocation. Preserved overall joint spaces  including glenohumeral, AC joint, radiocarpal joint. Severe osteopenia. Overlapping splint. IMPRESSION: There is a avulsion fracture of the olecranon with mild displacement associated soft tissue swelling  and joint effusion. Electronically Signed   By: Karen Kays M.D.   On: 10/09/2023 20:36    Assessment/Plan There are no diagnoses linked to this encounter.   Family/ staff Communication:   Labs/tests ordered:

## 2023-10-20 NOTE — Progress Notes (Addendum)
COVID Vaccine Completed:  Yes  Date of COVID positive in last 90 days:  PCP - Jacinta Shoe, MD Cardiologist - Jodelle Red, MD  Chest x-ray -  EKG - 04-15-23 Epic Stress Test - 03-24-17 Epic ECHO - 04-17-21 Epic Cardiac Cath - 04-07-17 Epic Pacemaker/ICD device last checked: Spinal Cord Stimulator: Holter monitor - 02-18-17 Epic   Bowel Prep -  N/A  Sleep Study -  CPAP -   Fasting Blood Sugar - N/A Checks Blood Sugar _____ times a day  Last dose of GLP1 agonist-  N/A GLP1 instructions:  Hold 7 days before surgery    Last dose of SGLT-2 inhibitors-  N/A SGLT-2 instructions:  Hold 3 days before surgery    Blood Thinner Instructions:  Time Aspirin Instructions: Last Dose:  Activity level:  Per nursing home ambulates with a walker and gait belt    Anesthesia review:  Afib, CAD, HTN, RBBB, hx of murmur  Patient denies shortness of breath, fever, cough and chest pain at PAT appointment  Patient verbalized understanding of instructions that were given to them at the PAT appointment. Patient was also instructed that they will need to review over the PAT instructions again at home before surgery.

## 2023-10-20 NOTE — Anesthesia Preprocedure Evaluation (Signed)
Anesthesia Evaluation  Patient identified by MRN, date of birth, ID band Patient awake    Reviewed: Allergy & Precautions, H&P , NPO status , Patient's Chart, lab work & pertinent test results  History of Anesthesia Complications (+) PONV and history of anesthetic complications  Airway Mallampati: III  TM Distance: >3 FB Neck ROM: Full    Dental no notable dental hx. (+) Teeth Intact, Dental Advisory Given   Pulmonary neg pulmonary ROS   Pulmonary exam normal breath sounds clear to auscultation       Cardiovascular Exercise Tolerance: Good hypertension, Pt. on medications  Rhythm:Regular Rate:Normal     Neuro/Psych   Anxiety    Dementia negative neurological ROS     GI/Hepatic negative GI ROS, Neg liver ROS,,,  Endo/Other  negative endocrine ROS    Renal/GU negative Renal ROS  negative genitourinary   Musculoskeletal  (+) Arthritis , Osteoarthritis,    Abdominal   Peds  Hematology  (+) Blood dyscrasia, anemia   Anesthesia Other Findings   Reproductive/Obstetrics negative OB ROS                             Anesthesia Physical Anesthesia Plan  ASA: 2  Anesthesia Plan: General   Post-op Pain Management: Regional block* and Tylenol PO (pre-op)*   Induction: Intravenous  PONV Risk Score and Plan: 4 or greater and Ondansetron, Dexamethasone, Propofol infusion and TIVA  Airway Management Planned: Oral ETT  Additional Equipment:   Intra-op Plan:   Post-operative Plan: Extubation in OR  Informed Consent: I have reviewed the patients History and Physical, chart, labs and discussed the procedure including the risks, benefits and alternatives for the proposed anesthesia with the patient or authorized representative who has indicated his/her understanding and acceptance.     Dental advisory given  Plan Discussed with: CRNA  Anesthesia Plan Comments:        Anesthesia Quick  Evaluation

## 2023-10-20 NOTE — Telephone Encounter (Signed)
 Patient's son is returning call and is requesting call back.

## 2023-10-21 ENCOUNTER — Ambulatory Visit (HOSPITAL_COMMUNITY)
Admission: RE | Admit: 2023-10-21 | Discharge: 2023-10-21 | Disposition: A | Discharge status: Skilled Nursing Facility | Payer: Medicare Other | Attending: Orthopedic Surgery | Admitting: Orthopedic Surgery

## 2023-10-21 ENCOUNTER — Encounter (HOSPITAL_COMMUNITY): Payer: Self-pay | Admitting: Orthopedic Surgery

## 2023-10-21 ENCOUNTER — Ambulatory Visit (HOSPITAL_COMMUNITY): Payer: Self-pay | Admitting: Physician Assistant

## 2023-10-21 ENCOUNTER — Other Ambulatory Visit: Payer: Self-pay

## 2023-10-21 ENCOUNTER — Encounter (HOSPITAL_COMMUNITY)
Admission: RE | Disposition: A | Discharge status: Skilled Nursing Facility | Payer: Self-pay | Source: Home / Self Care | Attending: Orthopedic Surgery

## 2023-10-21 ENCOUNTER — Ambulatory Visit (HOSPITAL_COMMUNITY): Payer: Medicare Other | Admitting: Physician Assistant

## 2023-10-21 DIAGNOSIS — S52022G Displaced fracture of olecranon process without intraarticular extension of left ulna, subsequent encounter for closed fracture with delayed healing: Secondary | ICD-10-CM

## 2023-10-21 DIAGNOSIS — D649 Anemia, unspecified: Secondary | ICD-10-CM

## 2023-10-21 DIAGNOSIS — M7022 Olecranon bursitis, left elbow: Secondary | ICD-10-CM | POA: Diagnosis not present

## 2023-10-21 DIAGNOSIS — W1809XA Striking against other object with subsequent fall, initial encounter: Secondary | ICD-10-CM | POA: Diagnosis not present

## 2023-10-21 DIAGNOSIS — N189 Chronic kidney disease, unspecified: Secondary | ICD-10-CM | POA: Insufficient documentation

## 2023-10-21 DIAGNOSIS — G8918 Other acute postprocedural pain: Secondary | ICD-10-CM | POA: Diagnosis not present

## 2023-10-21 DIAGNOSIS — S52002A Unspecified fracture of upper end of left ulna, initial encounter for closed fracture: Secondary | ICD-10-CM | POA: Diagnosis not present

## 2023-10-21 DIAGNOSIS — M71522 Other bursitis, not elsewhere classified, left elbow: Secondary | ICD-10-CM | POA: Diagnosis not present

## 2023-10-21 DIAGNOSIS — S52022A Displaced fracture of olecranon process without intraarticular extension of left ulna, initial encounter for closed fracture: Secondary | ICD-10-CM

## 2023-10-21 DIAGNOSIS — I129 Hypertensive chronic kidney disease with stage 1 through stage 4 chronic kidney disease, or unspecified chronic kidney disease: Secondary | ICD-10-CM | POA: Diagnosis not present

## 2023-10-21 DIAGNOSIS — I2510 Atherosclerotic heart disease of native coronary artery without angina pectoris: Secondary | ICD-10-CM | POA: Insufficient documentation

## 2023-10-21 DIAGNOSIS — I10 Essential (primary) hypertension: Secondary | ICD-10-CM

## 2023-10-21 HISTORY — DX: Unspecified dementia, unspecified severity, without behavioral disturbance, psychotic disturbance, mood disturbance, and anxiety: F03.90

## 2023-10-21 HISTORY — DX: Anxiety disorder, unspecified: F41.9

## 2023-10-21 HISTORY — PX: ORIF ELBOW FRACTURE: SHX5031

## 2023-10-21 HISTORY — PX: OLECRANON BURSECTOMY: SHX2097

## 2023-10-21 HISTORY — DX: Dyspnea, unspecified: R06.00

## 2023-10-21 LAB — CBC
HCT: 38.5 % (ref 36.0–46.0)
Hemoglobin: 12.6 g/dL (ref 12.0–15.0)
MCH: 32.2 pg (ref 26.0–34.0)
MCHC: 32.7 g/dL (ref 30.0–36.0)
MCV: 98.5 fL (ref 80.0–100.0)
Platelets: 213 10*3/uL (ref 150–400)
RBC: 3.91 MIL/uL (ref 3.87–5.11)
RDW: 13.4 % (ref 11.5–15.5)
WBC: 5.7 10*3/uL (ref 4.0–10.5)
nRBC: 0 % (ref 0.0–0.2)

## 2023-10-21 LAB — BASIC METABOLIC PANEL
Anion gap: 7 (ref 5–15)
BUN: 19 mg/dL (ref 8–23)
CO2: 23 mmol/L (ref 22–32)
Calcium: 9.2 mg/dL (ref 8.9–10.3)
Chloride: 108 mmol/L (ref 98–111)
Creatinine, Ser: 0.66 mg/dL (ref 0.44–1.00)
GFR, Estimated: >60 mL/min (ref >60)
Glucose, Bld: 95 mg/dL (ref 70–99)
Potassium: 3.9 mmol/L (ref 3.5–5.1)
Sodium: 138 mmol/L (ref 135–145)

## 2023-10-21 SURGERY — OPEN REDUCTION INTERNAL FIXATION (ORIF) ELBOW/OLECRANON FRACTURE
Anesthesia: General | Site: Elbow | Laterality: Left

## 2023-10-21 MED ORDER — NAPROXEN 250 MG PO TABS: 250.0000 mg | ORAL_TABLET | Freq: Two times a day (BID) | ORAL | Status: DC

## 2023-10-21 MED ORDER — ROCURONIUM BROMIDE 10 MG/ML (PF) SYRINGE
PREFILLED_SYRINGE | INTRAVENOUS | Status: DC | PRN
  Administered 2023-10-21: 50 mg via INTRAVENOUS

## 2023-10-21 MED ORDER — ACETAMINOPHEN 500 MG PO TABS: 1000.0000 mg | ORAL_TABLET | Freq: Once | ORAL | Status: DC

## 2023-10-21 MED ORDER — BUPIVACAINE-EPINEPHRINE (PF) 0.5% -1:200000 IJ SOLN
INTRAMUSCULAR | Status: DC | PRN
  Administered 2023-10-21: 15 mL

## 2023-10-21 MED ORDER — DEXAMETHASONE SODIUM PHOSPHATE 10 MG/ML IJ SOLN
INTRAMUSCULAR | Status: AC
  Filled 2023-10-21: qty 1

## 2023-10-21 MED ORDER — PROPOFOL 1000 MG/100ML IV EMUL
INTRAVENOUS | Status: AC
Start: 2023-10-21 — End: ?
  Filled 2023-10-21: qty 100

## 2023-10-21 MED ORDER — BUPIVACAINE LIPOSOME 1.3 % IJ SUSP
INTRAMUSCULAR | Status: DC | PRN
  Administered 2023-10-21: 10 mL via PERINEURAL

## 2023-10-21 MED ORDER — BUPIVACAINE-EPINEPHRINE 0.25% -1:200000 IJ SOLN
INTRAMUSCULAR | Status: AC
  Filled 2023-10-21: qty 1

## 2023-10-21 MED ORDER — MORPHINE SULFATE (PF) 2 MG/ML IV SOLN: 0.5000 mg | INTRAVENOUS | Status: DC | PRN

## 2023-10-21 MED ORDER — PROPOFOL 500 MG/50ML IV EMUL
INTRAVENOUS | Status: DC | PRN
  Administered 2023-10-21: 130 ug/kg/min via INTRAVENOUS

## 2023-10-21 MED ORDER — 0.9 % SODIUM CHLORIDE (POUR BTL) OPTIME
TOPICAL | Status: DC | PRN
  Administered 2023-10-21: 1000 mL

## 2023-10-21 MED ORDER — FENTANYL CITRATE PF 50 MCG/ML IJ SOSY
25.0000 ug | PREFILLED_SYRINGE | INTRAMUSCULAR | Status: DC | PRN
Start: 2023-10-21 — End: 2023-10-21

## 2023-10-21 MED ORDER — ONDANSETRON HCL 4 MG/2ML IJ SOLN
INTRAMUSCULAR | Status: AC
  Filled 2023-10-21: qty 2

## 2023-10-21 MED ORDER — CEFAZOLIN SODIUM-DEXTROSE 2-4 GM/100ML-% IV SOLN
2.0000 g | INTRAVENOUS | Status: AC
  Administered 2023-10-21: 2 g via INTRAVENOUS
  Filled 2023-10-21: qty 100

## 2023-10-21 MED ORDER — METOCLOPRAMIDE HCL 5 MG/ML IJ SOLN: 5.0000 mg | Freq: Three times a day (TID) | INTRAMUSCULAR | Status: DC | PRN

## 2023-10-21 MED ORDER — HYDRALAZINE HCL 20 MG/ML IJ SOLN: 5.0000 mg | Freq: Once | INTRAMUSCULAR | Status: AC

## 2023-10-21 MED ORDER — HYDRALAZINE HCL 20 MG/ML IJ SOLN
INTRAMUSCULAR | Status: AC
  Administered 2023-10-21: 5 mg via INTRAVENOUS
  Filled 2023-10-21: qty 1

## 2023-10-21 MED ORDER — POVIDONE-IODINE 10 % EX SWAB: 2.0000 | Freq: Once | CUTANEOUS | Status: DC

## 2023-10-21 MED ORDER — ROCURONIUM BROMIDE 10 MG/ML (PF) SYRINGE
PREFILLED_SYRINGE | INTRAVENOUS | Status: AC
  Filled 2023-10-21: qty 10

## 2023-10-21 MED ORDER — PHENYLEPHRINE 80 MCG/ML (10ML) SYRINGE FOR IV PUSH (FOR BLOOD PRESSURE SUPPORT)
PREFILLED_SYRINGE | INTRAVENOUS | Status: DC | PRN
Start: 2023-10-21 — End: 2023-10-21
  Administered 2023-10-21: 160 ug via INTRAVENOUS

## 2023-10-21 MED ORDER — FENTANYL CITRATE (PF) 100 MCG/2ML IJ SOLN
INTRAMUSCULAR | Status: AC
  Filled 2023-10-21: qty 2

## 2023-10-21 MED ORDER — LACTATED RINGERS IV SOLN: INTRAVENOUS | Status: DC | PRN

## 2023-10-21 MED ORDER — ORAL CARE MOUTH RINSE: 15.0000 mL | Freq: Once | OROMUCOSAL | Status: AC

## 2023-10-21 MED ORDER — ONDANSETRON HCL 4 MG PO TABS: 4.0000 mg | ORAL_TABLET | Freq: Four times a day (QID) | ORAL | Status: DC | PRN

## 2023-10-21 MED ORDER — CHLORHEXIDINE GLUCONATE 0.12 % MT SOLN
15.0000 mL | Freq: Once | OROMUCOSAL | Status: AC
  Administered 2023-10-21: 15 mL via OROMUCOSAL

## 2023-10-21 MED ORDER — PROPOFOL 500 MG/50ML IV EMUL
INTRAVENOUS | Status: AC
  Filled 2023-10-21: qty 50

## 2023-10-21 MED ORDER — LACTATED RINGERS IV BOLUS: 250.0000 mL | Freq: Once | INTRAVENOUS | Status: DC

## 2023-10-21 MED ORDER — PROPOFOL 10 MG/ML IV BOLUS
INTRAVENOUS | Status: AC
  Filled 2023-10-21: qty 20

## 2023-10-21 MED ORDER — HYDROCODONE-ACETAMINOPHEN 7.5-325 MG PO TABS
1.0000 | ORAL_TABLET | ORAL | Status: DC | PRN
Start: 2023-10-21 — End: 2023-10-21

## 2023-10-21 MED ORDER — PROPOFOL 10 MG/ML IV BOLUS
INTRAVENOUS | Status: DC | PRN
  Administered 2023-10-21: 80 mg via INTRAVENOUS

## 2023-10-21 MED ORDER — SUGAMMADEX SODIUM 200 MG/2ML IV SOLN
INTRAVENOUS | Status: DC | PRN
  Administered 2023-10-21: 200 mg via INTRAVENOUS

## 2023-10-21 MED ORDER — ACETAMINOPHEN 500 MG PO TABS: 1000.0000 mg | ORAL_TABLET | Freq: Four times a day (QID) | ORAL | Status: DC

## 2023-10-21 MED ORDER — METOCLOPRAMIDE HCL 5 MG PO TABS: 5.0000 mg | ORAL_TABLET | Freq: Three times a day (TID) | ORAL | Status: DC | PRN

## 2023-10-21 MED ORDER — ONDANSETRON HCL 4 MG/2ML IJ SOLN
4.0000 mg | Freq: Four times a day (QID) | INTRAMUSCULAR | Status: DC | PRN
Start: 2023-10-21 — End: 2023-10-21

## 2023-10-21 MED ORDER — ONDANSETRON HCL 4 MG/2ML IJ SOLN
INTRAMUSCULAR | Status: DC | PRN
  Administered 2023-10-21: 4 mg via INTRAVENOUS

## 2023-10-21 MED ORDER — FENTANYL CITRATE PF 50 MCG/ML IJ SOSY
100.0000 ug | PREFILLED_SYRINGE | INTRAMUSCULAR | Status: DC
  Administered 2023-10-21: 50 ug via INTRAVENOUS
  Filled 2023-10-21: qty 2

## 2023-10-21 MED ORDER — PHENYLEPHRINE HCL-NACL 20-0.9 MG/250ML-% IV SOLN
INTRAVENOUS | Status: AC
  Filled 2023-10-21: qty 250

## 2023-10-21 MED ORDER — LACTATED RINGERS IV BOLUS: 500.0000 mL | Freq: Once | INTRAVENOUS | Status: DC

## 2023-10-21 MED ORDER — PHENYLEPHRINE HCL-NACL 20-0.9 MG/250ML-% IV SOLN
INTRAVENOUS | Status: DC | PRN
  Administered 2023-10-21: 50 ug/min via INTRAVENOUS

## 2023-10-21 MED ORDER — CEFAZOLIN SODIUM-DEXTROSE 2-4 GM/100ML-% IV SOLN: 2.0000 g | Freq: Four times a day (QID) | INTRAVENOUS | Status: DC

## 2023-10-21 MED ORDER — LIDOCAINE HCL (PF) 2 % IJ SOLN
INTRAMUSCULAR | Status: AC
  Filled 2023-10-21: qty 5

## 2023-10-21 MED ORDER — FENTANYL CITRATE (PF) 100 MCG/2ML IJ SOLN
INTRAMUSCULAR | Status: DC | PRN
  Administered 2023-10-21: 25 ug via INTRAVENOUS
  Administered 2023-10-21: 50 ug via INTRAVENOUS
  Administered 2023-10-21: 25 ug via INTRAVENOUS

## 2023-10-21 MED ORDER — LIDOCAINE 2% (20 MG/ML) 5 ML SYRINGE
INTRAMUSCULAR | Status: DC | PRN
  Administered 2023-10-21: 40 mg via INTRAVENOUS

## 2023-10-21 MED ORDER — DEXAMETHASONE SODIUM PHOSPHATE 10 MG/ML IJ SOLN
8.0000 mg | Freq: Once | INTRAMUSCULAR | Status: AC
  Administered 2023-10-21: 8 mg via INTRAVENOUS

## 2023-10-21 MED ORDER — PHENYLEPHRINE 80 MCG/ML (10ML) SYRINGE FOR IV PUSH (FOR BLOOD PRESSURE SUPPORT)
PREFILLED_SYRINGE | INTRAVENOUS | Status: AC
  Filled 2023-10-21: qty 10

## 2023-10-21 MED ORDER — MIDAZOLAM HCL 2 MG/2ML IJ SOLN: 2.0000 mg | INTRAMUSCULAR | Status: DC

## 2023-10-21 SURGICAL SUPPLY — 65 items
BAG COUNTER SPONGE SURGICOUNT (BAG) ×1 IMPLANT
BLADE SURG 15 STRL LF DISP TIS (BLADE) ×1 IMPLANT
BNDG COHESIVE 4X5 TAN STRL LF (GAUZE/BANDAGES/DRESSINGS) ×1 IMPLANT
BNDG ELASTIC 4INX 5YD STR LF (GAUZE/BANDAGES/DRESSINGS) ×2 IMPLANT
BNDG ESMARK 4X9 LF (GAUZE/BANDAGES/DRESSINGS) IMPLANT
BNDG GAUZE DERMACEA FLUFF 4 (GAUZE/BANDAGES/DRESSINGS) IMPLANT
BNDG PLASTER X FAST 5X5 WHT LF (CAST SUPPLIES) IMPLANT
CANISTER SUCT 3000ML PPV (MISCELLANEOUS) ×1 IMPLANT
CHLORAPREP W/TINT 26 (MISCELLANEOUS) ×1 IMPLANT
CLSR STERI-STRIP ANTIMIC 1/2X4 (GAUZE/BANDAGES/DRESSINGS) IMPLANT
COVER BACK TABLE 60X90IN (DRAPES) ×1 IMPLANT
CUFF TOURN SGL QUICK 18X4 (TOURNIQUET CUFF) ×1 IMPLANT
DRAPE EXTREMITY T 121X128X90 (DISPOSABLE) ×1 IMPLANT
DRAPE IMP U-DRAPE 54X76 (DRAPES) ×1 IMPLANT
DRAPE OEC MINIVIEW 54X84 (DRAPES) ×1 IMPLANT
DRAPE U-SHAPE 47X51 STRL (DRAPES) ×1 IMPLANT
DRSG ADAPTIC 3X8 NADH LF (GAUZE/BANDAGES/DRESSINGS) ×1 IMPLANT
ELECT REM PT RETURN 15FT ADLT (MISCELLANEOUS) ×1 IMPLANT
FIBERTAPE CERCLAGE TLINK SUT (SUTURE) IMPLANT
GAUZE PAD ABD 8X10 STRL (GAUZE/BANDAGES/DRESSINGS) ×2 IMPLANT
GAUZE SPONGE 4X4 12PLY STRL (GAUZE/BANDAGES/DRESSINGS) ×1 IMPLANT
GAUZE XEROFORM 1X8 LF (GAUZE/BANDAGES/DRESSINGS) IMPLANT
GLOVE BIO SURGEON STRL SZ7.5 (GLOVE) ×1 IMPLANT
GLOVE BIOGEL PI IND STRL 7.5 (GLOVE) ×1 IMPLANT
GLOVE BIOGEL PI IND STRL 8 (GLOVE) ×1 IMPLANT
GLOVE SURG SYN 7.0 (GLOVE) ×1
GLOVE SURG SYN 7.0 PF PI (GLOVE) ×1 IMPLANT
GOWN SPEC L4 XLG W/TWL (GOWN DISPOSABLE) ×1 IMPLANT
GOWN STRL NON-REIN LRG LVL3 (GOWN DISPOSABLE) ×1 IMPLANT
K-WIRE COCR 1.1X105 (WIRE) ×2
KIT BASIN OR (CUSTOM PROCEDURE TRAY) ×1 IMPLANT
KIT TURNOVER KIT A (KITS) IMPLANT
KWIRE COCR 1.1X105 (WIRE) IMPLANT
NDL HYPO 25X1 1.5 SAFETY (NEEDLE) IMPLANT
NDL MA TROC 1/2 CIR (NEEDLE) IMPLANT
NDL MAYO 6 CRC TAPER PT (NEEDLE) IMPLANT
NDL MAYO CATGUT SZ4 TPR NDL (NEEDLE) IMPLANT
NEEDLE HYPO 25X1 1.5 SAFETY (NEEDLE)
NEEDLE MA TROC 1/2 CIR (NEEDLE) ×1
NEEDLE MAYO 6 CRC TAPER PT (NEEDLE)
NEEDLE MAYO CATGUT SZ4 (NEEDLE)
NS IRRIG 1000ML POUR BTL (IV SOLUTION) ×1 IMPLANT
PAD CAST 4YDX4 CTTN HI CHSV (CAST SUPPLIES) ×3 IMPLANT
PADDING CAST ABS COTTON 4X4 ST (CAST SUPPLIES) ×1 IMPLANT
PENCIL SMOKE EVACUATOR (MISCELLANEOUS) ×1 IMPLANT
SLING ARM FOAM STRAP LRG (SOFTGOODS) IMPLANT
SLING ARM FOAM STRAP MED (SOFTGOODS) IMPLANT
SPIKE FLUID TRANSFER (MISCELLANEOUS) IMPLANT
STOCKINETTE 8 INCH (MISCELLANEOUS) ×1 IMPLANT
SUCTION TUBE FRAZIER 10FR DISP (SUCTIONS) ×1 IMPLANT
SUT ETHILON 3 0 PS 1 (SUTURE) ×1 IMPLANT
SUT FIBERWIRE #2 38 REV NDL BL (SUTURE)
SUT FIBERWIRE #2 38 T-5 BLUE (SUTURE)
SUT MNCRL AB 3-0 PS2 18 (SUTURE) IMPLANT
SUT MNCRL AB 4-0 PS2 18 (SUTURE) IMPLANT
SUT MON AB 2-0 CT1 36 (SUTURE) IMPLANT
SUT VIC AB 0 CT1 27XBRD ANTBC (SUTURE) IMPLANT
SUT VIC AB 2-0 SH 27XBRD (SUTURE) IMPLANT
SUT VICRYL+ 3-0 36IN CT-1 (SUTURE) ×1 IMPLANT
SUTURE FIBERWR #2 38 T-5 BLUE (SUTURE) IMPLANT
SUTURE FIBERWR#2 38 REV NDL BL (SUTURE) IMPLANT
SYR BULB EAR ULCER 3OZ GRN STR (SYRINGE) ×1 IMPLANT
TOWEL OR 17X26 10 PK STRL BLUE (TOWEL DISPOSABLE) ×1 IMPLANT
TUBING CONNECTING 10 (TUBING) ×1 IMPLANT
UNDERPAD 30X36 HEAVY ABSORB (UNDERPADS AND DIAPERS) ×1 IMPLANT

## 2023-10-21 NOTE — Interval H&P Note (Signed)
History and Physical Interval Note:  10/21/2023 7:13 AM  [name]  has presented today for surgery, with the diagnosis of LEFT OLECRANON FRACTURE,BURSITIS.  The various methods of treatment have been discussed with the patient and family. After consideration of risks, benefits and other options for treatment, the patient has consented to  Procedure(s): Open Reduction Internal fixation (ORIF), fracture, elbow/olecranon (Left) OLECRANON (ELBOW) BURSA (Left) as a surgical intervention.  The patient's history has been reviewed, patient examined, no change in status, stable for surgery.  I have reviewed the patient's chart and labs.  Questions were answered to the patient's satisfaction.     Sheral Apley

## 2023-10-21 NOTE — Anesthesia Postprocedure Evaluation (Signed)
Anesthesia Post Note  Patient: [name]  Procedure(s) Performed: Open Reduction Internal fixation (ORIF), fracture, elbow/olecranon (Left: Elbow) OLECRANON (ELBOW) BURSA (Left: Elbow)     Patient location during evaluation: PACU Anesthesia Type: General and Regional Level of consciousness: awake and alert Pain management: pain level controlled Vital Signs Assessment: post-procedure vital signs reviewed and stable Respiratory status: spontaneous breathing, nonlabored ventilation and respiratory function stable Cardiovascular status: blood pressure returned to baseline and stable Postop Assessment: no apparent nausea or vomiting Anesthetic complications: no  No notable events documented.  Last Vitals:  Vitals:   10/21/23 1230 10/21/23 1300  BP: 139/63 114/68  Pulse: 73 74  Resp: 16 15  Temp:    SpO2: 91% 92%    Last Pain:  Vitals:   10/21/23 1130  TempSrc:   PainSc: Asleep                 Hondo Nanda,W. EDMOND

## 2023-10-21 NOTE — Discharge Instructions (Signed)

## 2023-10-21 NOTE — Anesthesia Procedure Notes (Signed)
Anesthesia Regional Block: Interscalene brachial plexus block   Pre-Anesthetic Checklist: , timeout performed,  Correct Patient, Correct Site, Correct Laterality,  Correct Procedure, Correct Position, site marked,  Risks and benefits discussed,  Pre-op evaluation,  At surgeon's request and post-op pain management  Laterality: Left  Prep: Maximum Sterile Barrier Precautions used, chloraprep       Needles:  Injection technique: Single-shot  Needle Type: Echogenic Stimulator Needle     Needle Length: 5cm  Needle Gauge: 22     Additional Needles:   Procedures:, nerve stimulator,,, ultrasound used (permanent image in chart),,     Nerve Stimulator or Paresthesia:  Response: Biceps response  Additional Responses:   Narrative:  Start time: 10/21/2023 8:05 AM End time: 10/21/2023 8:15 AM Injection made incrementally with aspirations every 5 mL.  Performed by: Personally  Anesthesiologist: Gaynelle Adu, MD

## 2023-10-21 NOTE — Op Note (Signed)
10/21/2023  10:21 AM  PATIENT:  [name]    PRE-OPERATIVE DIAGNOSIS:  LEFT OLECRANON FRACTURE,BURSITIS  POST-OPERATIVE DIAGNOSIS:  Same  PROCEDURE:  Open Reduction Internal fixation (ORIF), fracture, elbow/olecranon, OLECRANON (ELBOW) BURSA  SURGEON:  Sheral Apley, MD  ASSISTANT: Levester Fresh, PA-C, he was present and scrubbed throughout the case, critical for completion in a timely fashion, and for retraction, instrumentation, and closure.   ANESTHESIA:   gen  PREOPERATIVE INDICATIONS:  [name] is a  82 y.o. female with a diagnosis of LEFT OLECRANON FRACTURE,BURSITIS who failed conservative measures and elected for surgical management.    The risks benefits and alternatives were discussed with the patient preoperatively including but not limited to the risks of infection, bleeding, nerve injury, cardiopulmonary complications, the need for revision surgery, among others, and the patient was willing to proceed.  OPERATIVE IMPLANTS: tension band with K wires and Fibertape  OPERATIVE FINDINGS: unstable olecranon fracture  BLOOD LOSS: minimal  COMPLICATIONS: non  TOURNIQUET TIME: see operative report  OPERATIVE PROCEDURE:  Patient was identified in the preoperative holding area and site was marked by me She was transported to the operating theater and placed on the table in lateral position taking care to pad all bony prominences. After a preincinduction time out anesthesia was induced. The left upper extremity was prepped and draped in normal sterile fashion and a pre-incision timeout was performed. She received ancef for preoperative antibiotics.   I made an incision directly over the olecranon fracture.  Ulnar nerve was protected throughout.  I dissected down to the periosteum and this was incised over the fracture.  I then removed any soft tissue from within the fracture and reduce this fracture and held it with a tenaculum  2 K wires were then placed across  the fracture in a bicortical fashion.  I used multiple fluoroscopic views of the elbow to confirm this.  I also confirmed anatomic alignment  I then drilled 2 holes distally in the ulna and was able to pass the fiber tape through the interosseous tunnel and then in a figure-of-eight fashion around the K wires.  I tension this.  I then backed the K wires up bent and tamped them back down tucking them below the triceps.  I examined 4 views of the olecranon with fluoroscopy was happy with the fracture reduction and hardware alignment thorough irrigation was performed prior to pinning of the joint and of the wound.  Skin was then closed in layers sterile dressing and a long-arm splint was applied patient was awoken and taken to the PACU in stable condition  POST OPERATIVE PLAN: sling full time. Dvt px: mobilize and chemical when indicated

## 2023-10-21 NOTE — Progress Notes (Signed)
OT Cancellation Note  Patient Details Name: [name] MRN: [id number] DOB: September 15, 1941   Cancelled Treatment:    Reason Eval/Treat Not Completed: Other (comment) Per PACU nurse Dot Lanes, MD reported patient does not need therapy at this time and plans to return to SNF at time of d/c.  OT to sign off.  Rosalio Loud, MS Acute Rehabilitation Department Office# [phone]  10/21/2023, 2:23 PM

## 2023-10-21 NOTE — Transfer of Care (Signed)
Immediate Anesthesia Transfer of Care Note  Patient: THAILY STEFANO  Procedure(s) Performed: Open Reduction Internal fixation (ORIF), fracture, elbow/olecranon (Left: Elbow) OLECRANON (ELBOW) BURSA (Left: Elbow)  Patient Location: PACU  Anesthesia Type:GA combined with regional for post-op pain  Level of Consciousness: drowsy and patient cooperative  Airway & Oxygen Therapy: Patient Spontanous Breathing and Patient connected to face mask oxygen  Post-op Assessment: Report given to RN and Post -op Vital signs reviewed and stable  Post vital signs: Reviewed and stable  Last Vitals:  Vitals Value Taken Time  BP 196/75 10/21/23 1057  Temp    Pulse 62 10/21/23 1059  Resp 18 10/21/23 1059  SpO2 100 % 10/21/23 1059  Vitals shown include unfiled device data.  Last Pain:  Vitals:   10/21/23 0821  TempSrc:   PainSc: 0-No pain         Complications: No notable events documented.

## 2023-10-22 ENCOUNTER — Encounter (HOSPITAL_COMMUNITY): Payer: Self-pay | Admitting: Orthopedic Surgery

## 2023-10-22 DIAGNOSIS — M63812 Disorders of muscle in diseases classified elsewhere, left shoulder: Secondary | ICD-10-CM | POA: Diagnosis not present

## 2023-10-22 DIAGNOSIS — R2689 Other abnormalities of gait and mobility: Secondary | ICD-10-CM | POA: Diagnosis not present

## 2023-10-22 DIAGNOSIS — M6259 Muscle wasting and atrophy, not elsewhere classified, multiple sites: Secondary | ICD-10-CM | POA: Diagnosis not present

## 2023-10-22 DIAGNOSIS — M25512 Pain in left shoulder: Secondary | ICD-10-CM | POA: Diagnosis not present

## 2023-10-22 DIAGNOSIS — S52022D Displaced fracture of olecranon process without intraarticular extension of left ulna, subsequent encounter for closed fracture with routine healing: Secondary | ICD-10-CM | POA: Diagnosis not present

## 2023-10-22 DIAGNOSIS — M25522 Pain in left elbow: Secondary | ICD-10-CM | POA: Diagnosis not present

## 2023-10-23 DIAGNOSIS — M63812 Disorders of muscle in diseases classified elsewhere, left shoulder: Secondary | ICD-10-CM | POA: Diagnosis not present

## 2023-10-23 DIAGNOSIS — S52022D Displaced fracture of olecranon process without intraarticular extension of left ulna, subsequent encounter for closed fracture with routine healing: Secondary | ICD-10-CM | POA: Diagnosis not present

## 2023-10-23 DIAGNOSIS — M25512 Pain in left shoulder: Secondary | ICD-10-CM | POA: Diagnosis not present

## 2023-10-23 DIAGNOSIS — R2689 Other abnormalities of gait and mobility: Secondary | ICD-10-CM | POA: Diagnosis not present

## 2023-10-23 DIAGNOSIS — M6259 Muscle wasting and atrophy, not elsewhere classified, multiple sites: Secondary | ICD-10-CM | POA: Diagnosis not present

## 2023-10-23 DIAGNOSIS — M25522 Pain in left elbow: Secondary | ICD-10-CM | POA: Diagnosis not present

## 2023-10-24 ENCOUNTER — Ambulatory Visit (HOSPITAL_BASED_OUTPATIENT_CLINIC_OR_DEPARTMENT_OTHER): Payer: Medicare Other

## 2023-10-24 DIAGNOSIS — R2689 Other abnormalities of gait and mobility: Secondary | ICD-10-CM | POA: Diagnosis not present

## 2023-10-24 DIAGNOSIS — M25512 Pain in left shoulder: Secondary | ICD-10-CM | POA: Diagnosis not present

## 2023-10-24 DIAGNOSIS — S52022D Displaced fracture of olecranon process without intraarticular extension of left ulna, subsequent encounter for closed fracture with routine healing: Secondary | ICD-10-CM | POA: Diagnosis not present

## 2023-10-24 DIAGNOSIS — M6259 Muscle wasting and atrophy, not elsewhere classified, multiple sites: Secondary | ICD-10-CM | POA: Diagnosis not present

## 2023-10-24 DIAGNOSIS — M63812 Disorders of muscle in diseases classified elsewhere, left shoulder: Secondary | ICD-10-CM | POA: Diagnosis not present

## 2023-10-24 DIAGNOSIS — M25522 Pain in left elbow: Secondary | ICD-10-CM | POA: Diagnosis not present

## 2023-10-27 DIAGNOSIS — M25522 Pain in left elbow: Secondary | ICD-10-CM | POA: Diagnosis not present

## 2023-10-27 DIAGNOSIS — M63812 Disorders of muscle in diseases classified elsewhere, left shoulder: Secondary | ICD-10-CM | POA: Diagnosis not present

## 2023-10-27 DIAGNOSIS — M25512 Pain in left shoulder: Secondary | ICD-10-CM | POA: Diagnosis not present

## 2023-10-27 DIAGNOSIS — R2689 Other abnormalities of gait and mobility: Secondary | ICD-10-CM | POA: Diagnosis not present

## 2023-10-27 DIAGNOSIS — S52022D Displaced fracture of olecranon process without intraarticular extension of left ulna, subsequent encounter for closed fracture with routine healing: Secondary | ICD-10-CM | POA: Diagnosis not present

## 2023-10-27 DIAGNOSIS — M6259 Muscle wasting and atrophy, not elsewhere classified, multiple sites: Secondary | ICD-10-CM | POA: Diagnosis not present

## 2023-10-28 ENCOUNTER — Non-Acute Institutional Stay (SKILLED_NURSING_FACILITY): Payer: Self-pay | Admitting: Orthopedic Surgery

## 2023-10-28 ENCOUNTER — Encounter: Payer: Self-pay | Admitting: Orthopedic Surgery

## 2023-10-28 DIAGNOSIS — S52022G Displaced fracture of olecranon process without intraarticular extension of left ulna, subsequent encounter for closed fracture with delayed healing: Secondary | ICD-10-CM

## 2023-10-28 DIAGNOSIS — M25512 Pain in left shoulder: Secondary | ICD-10-CM | POA: Diagnosis not present

## 2023-10-28 DIAGNOSIS — M63812 Disorders of muscle in diseases classified elsewhere, left shoulder: Secondary | ICD-10-CM | POA: Diagnosis not present

## 2023-10-28 DIAGNOSIS — I10 Essential (primary) hypertension: Secondary | ICD-10-CM | POA: Diagnosis not present

## 2023-10-28 DIAGNOSIS — M6259 Muscle wasting and atrophy, not elsewhere classified, multiple sites: Secondary | ICD-10-CM | POA: Diagnosis not present

## 2023-10-28 DIAGNOSIS — R2689 Other abnormalities of gait and mobility: Secondary | ICD-10-CM | POA: Diagnosis not present

## 2023-10-28 DIAGNOSIS — M25522 Pain in left elbow: Secondary | ICD-10-CM | POA: Diagnosis not present

## 2023-10-28 DIAGNOSIS — S52022D Displaced fracture of olecranon process without intraarticular extension of left ulna, subsequent encounter for closed fracture with routine healing: Secondary | ICD-10-CM | POA: Diagnosis not present

## 2023-10-28 MED ORDER — AMLODIPINE BESYLATE 5 MG PO TABS: 5.0000 mg | ORAL_TABLET | Freq: Every day | ORAL | Status: DC

## 2023-10-28 NOTE — Progress Notes (Signed)
Location:  [facility] Room Number: 159/A Place of Service:  SNF (31) Provider:  Octavia Heir, NP   Plotnikov, Georgina Quint, MD  Patient Care Team: Tresa Garter, MD as PCP - General (Internal Medicine) Jodelle Red, MD as PCP - Cardiology (Cardiology) [place], Rie Mcneil, MD (Dermatology) [place], [place] Lowella Bandy., MD (Urology) Romie Levee, MD as Consulting Physician (General Surgery) Napoleon Form, MD as Consulting Physician (Gastroenterology) Melina Fiddler, MD as Consulting Physician (Sports Medicine)  Extended Emergency Contact Information Primary Emergency Contact: Manuelito,Nick Mobile Phone: [phone] Relation: Son  Code Status:  DNR Goals of care: Advanced Directive information    10/21/2023    7:35 AM  Advanced Directives  Type of Advance Directive Out of facility DNR (pink MOST or yellow form)     Chief Complaint  Patient presents with   Acute Visit    HPI:  Pt is a 82 y.o. female seen today for acute visit due to hypertension.   She currently resides on the rehabilitation unit at [facility]. PMH: HTN, HLD, osteoporosis, diverticulosis, CKD, chronic constipation and unstable gait.   H/o HTN. Followed by cardiology. Blood pressures > 150/90 within past week and prior to ORIF left shoulder. S/p ORIF 12/03. Denies left shoulder pain, chest pain, sob, HA, blurred vision. Remains on amlodipine 2.5 mg and olmesartan 20 mg daily. Admits to some anxiety. Remains on ativan prn but not using per nursing. See pressures below.   BUN/creat 19/0.66 10/21/2023  Recent blood pressures:  12/08- 173/70, 133/74, 147/73, 174/78  12/07- 157/77, 174/70     Past Medical History:  Diagnosis Date   Abdominal pain, unspecified site    ADVERSE REACTION TO MEDICATION 07/31/2009   ANEMIA-NOS 07/07/2007   Anxiety    Arthritis    Chest pain, unspecified    Chronic kidney disease    hx of kidney stone   COLONIC POLYPS,  ADENOMATOUS, HX OF    Complication of anesthesia    Dementia (HCC)    Disturbance of skin sensation    DIVERTICULOSIS, COLON 02/23/2009   Dyspnea    Family history of diabetes mellitus    Family history of malignant neoplasm of breast    FATIGUE 05/25/2009   Fever, unspecified    Heart murmur    hx of   HYPERLIPIDEMIA 07/07/2007   HYPERPARATHYROIDISM UNSPECIFIED 10/20/2007   HYPERTENSION 02/04/2008   Incomplete right bundle branch block (RBBB) determined by electrocardiography 04/15/2023   Irritable bowel syndrome (IBS)    NEPHROLITHIASIS, HX OF 11/15/2008   OSTEOPOROSIS 01/01/2008   Other acquired absence of organ    parathyroidectomy   Pain in limb    PALPITATIONS, OCCASIONAL 07/12/2008   PARATHYROIDECTOMY 01/02/2009   PONV (postoperative nausea and vomiting)    SCOLIOSIS 05/25/2009   Seasonal allergies    Unspecified constipation    URINARY INCONTINENCE 07/07/2007   UTI'S, RECURRENT 11/20/2009   kimbrough    Past Surgical History:  Procedure Laterality Date   BUNIONECTOMY     CATARACT EXTRACTION, BILATERAL     COLONOSCOPY     EXTRACORPOREAL SHOCK WAVE LITHOTRIPSY Left 12/21/2020   Procedure: EXTRACORPOREAL SHOCK WAVE LITHOTRIPSY (ESWL);  Surgeon: Jerilee Field, MD;  Location: [facility];  Service: Urology;  Laterality: Left;   LAPAROSCOPIC APPENDECTOMY  01/19/2016   Procedure: APPENDECTOMY LAPAROSCOPIC with orifice of cecal polyp;  Surgeon: Romie Levee, MD;  Location: [facility];  Service: General;;   LEFT HEART CATH AND CORONARY ANGIOGRAPHY N/A 04/07/2017   Procedure: Left  Heart Cath and Coronary Angiography;  Surgeon: [name], MD;  Location: [facility];  Service: Cardiovascular;  Laterality: N/A;   OLECRANON BURSECTOMY Left 10/21/2023   Procedure: OLECRANON (ELBOW) BURSA;  Surgeon: Sheral Apley, MD;  Location: [facility];  Service: Orthopedics;  Laterality: Left;   ORIF ELBOW FRACTURE Left 10/21/2023   Procedure: Open Reduction Internal  fixation (ORIF), fracture, elbow/olecranon;  Surgeon: Sheral Apley, MD;  Location: [facility];  Service: Orthopedics;  Laterality: Left;   PARATHYROIDECTOMY     Rt Superior open neck exploration   POLYPECTOMY     RIGHT OOPHORECTOMY     TONSILLECTOMY      Allergies  Allergen Reactions   Anesthetics, Amide Other (See Comments)    Pt states she had hallucinations and HTN after procedure 01/19/16-she feels may have been reaction to anesthetic   Lisinopril Cough    Outpatient Encounter Medications as of 10/28/2023  Medication Sig   acetaminophen (TYLENOL) 500 MG tablet Take 2 tablets (1,000 mg total) by mouth 2 (two) times daily.   acetaminophen (TYLENOL) 500 MG tablet Take 2 tablets (1,000 mg total) by mouth daily as needed.   amLODipine (NORVASC) 2.5 MG tablet Take 1 tablet (2.5 mg total) by mouth daily.   b complex vitamins capsule Take 1 capsule by mouth daily.   Cholecalciferol (VITAMIN D3) 125 MCG (5000 UT) CAPS Take 1 capsule (5,000 Units total) by mouth daily.   denosumab (PROLIA) 60 MG/ML SOSY injection Inject 60 mg into the skin every 6 (six) months.   olmesartan (BENICAR) 20 MG tablet Take 1 tablet (20 mg total) by mouth 2 (two) times daily.   ondansetron (ZOFRAN-ODT) 4 MG disintegrating tablet Take 4 mg by mouth every 8 (eight) hours as needed for nausea or vomiting.   oxyCODONE (ROXICODONE) 5 MG immediate release tablet Take 0.5 tablets (2.5 mg total) by mouth every 4 (four) hours as needed for up to 14 days for severe pain (pain score 7-10).   polyethylene glycol (MIRALAX / GLYCOLAX) 17 g packet Take 17 g by mouth daily as needed for mild constipation.   senna (SENOKOT) 8.6 MG TABS tablet Take 2 tablets (17.2 mg total) by mouth at bedtime.   No facility-administered encounter medications on file as of 10/28/2023.    Review of Systems  Constitutional:  Negative for activity change and appetite change.  Respiratory:  Negative for shortness of breath.   Cardiovascular:   Negative for chest pain.  Musculoskeletal:  Positive for arthralgias and gait problem.  Neurological:  Negative for dizziness and headaches.  Psychiatric/Behavioral:  Negative for dysphoric mood. The patient is nervous/anxious.     Immunization History  Administered Date(s) Administered   Fluad Trivalent(High Dose 65+) 08/04/2023   Influenza Split 09/17/2011, 11/08/2015, 09/12/2017, 08/02/2020   Influenza Whole 08/19/2008   Influenza, High Dose Seasonal PF 08/20/2016, 09/12/2017, 08/21/2018, 09/28/2019, 10/12/2021, 09/01/2022   Influenza-Unspecified 09/18/2013, 08/18/2014, 11/08/2015, 09/15/2020   Moderna SARS-COV2 Booster Vaccination 07/03/2021   Moderna Sars-Covid-2 Vaccination 11/30/2019, 12/31/2019, 10/26/2020, 07/03/2021, 04/22/2022   Pneumococcal Conjugate-13 12/29/2013   Pneumococcal Polysaccharide-23 11/08/2015   Tdap 01/05/2015   Zoster Recombinant(Shingrix) 06/20/2021, 09/05/2021   Pertinent  Health Maintenance Due  Topic Date Due   INFLUENZA VACCINE  Completed   DEXA SCAN  Completed   Colonoscopy  Discontinued      03/18/2023    2:49 PM 03/31/2023   11:10 AM 04/23/2023   11:26 AM 08/04/2023    3:04 PM 10/14/2023    1:46 PM  Fall Risk  Falls in the past year? 1 0 0 0 1  Was there an injury with Fall? 1 0 0 0 1  Fall Risk Category Calculator 2 0 0 0 2  Patient at Risk for Falls Due to History of fall(s) No Fall Risks No Fall Risks No Fall Risks History of fall(s);Impaired balance/gait  Fall risk Follow up Falls evaluation completed Falls evaluation completed Falls evaluation completed Falls evaluation completed Falls evaluation completed;Education provided;Falls prevention discussed   Functional Status Survey:    Vitals:   10/28/23 1612  BP: 130/69  Pulse: (!) 56  Resp: 16  Temp: 98.1 F (36.7 C)  SpO2: 95%  Weight: 109 lb 9.6 oz (49.7 kg)  Height: 4\' 10"  (1.473 m)   Body mass index is 22.91 kg/m. Physical Exam Vitals reviewed.  Constitutional:       General: She is not in acute distress. HENT:     Head: Normocephalic.  Eyes:     General:        Right eye: No discharge.        Left eye: No discharge.  Cardiovascular:     Rate and Rhythm: Normal rate and regular rhythm.     Pulses: Normal pulses.     Heart sounds: Normal heart sounds.  Pulmonary:     Effort: Pulmonary effort is normal.     Breath sounds: Normal breath sounds.  Abdominal:     General: Bowel sounds are normal.     Palpations: Abdomen is soft.  Musculoskeletal:     Cervical back: Neck supple.     Right lower leg: No edema.     Left lower leg: No edema.     Comments: Left arm in sling, radial pulse 2+  Skin:    General: Skin is warm.     Capillary Refill: Capillary refill takes less than 2 seconds.     Comments: Left shoulder surgical dressing CDI  Neurological:     General: No focal deficit present.     Mental Status: She is alert and oriented to person, place, and time.     Gait: Gait abnormal.  Psychiatric:        Mood and Affect: Mood normal.     Labs reviewed: Recent Labs    11/22/22 1137 03/18/23 1544 10/21/23 0657  NA 141 142 138  K 3.9 4.1 3.9  CL 107 108 108  CO2 26 26 23   GLUCOSE 87 103* 95  BUN 17 18 19   CREATININE 0.77 0.81 0.66  CALCIUM 9.3 9.3 9.2   Recent Labs    11/22/22 1137 03/18/23 1544  AST 21 19  ALT 15 12  ALKPHOS 51 59  BILITOT 1.0 0.8  PROT 6.5 6.5  ALBUMIN 4.1 4.0   Recent Labs    10/21/23 0657  WBC 5.7  HGB 12.6  HCT 38.5  MCV 98.5  PLT 213   Lab Results  Component Value Date   TSH 1.20 03/18/2023   No results found for: "HGBA1C" Lab Results  Component Value Date   CHOL 132 01/25/2015   HDL 68 01/25/2015   LDLCALC 51 01/25/2015   LDLDIRECT 156.3 03/28/2011   TRIG 64 01/25/2015   CHOLHDL 1.9 01/25/2015    Significant Diagnostic Results in last 30 days:  CT Head Wo Contrast  Result Date: 10/09/2023 CLINICAL DATA:  Head trauma, fall. EXAM: CT HEAD WITHOUT CONTRAST TECHNIQUE: Contiguous  axial images were obtained from the base of the skull through the vertex without intravenous  contrast. RADIATION DOSE REDUCTION: This exam was performed according to the departmental dose-optimization program which includes automated exposure control, adjustment of the mA and/or kV according to patient size and/or use of iterative reconstruction technique. COMPARISON:  03/19/2023 FINDINGS: Brain: No intracranial hemorrhage, mass effect, or midline shift. Age related atrophy. No hydrocephalus. The basilar cisterns are patent. No evidence of territorial infarct or acute ischemia. No extra-axial or intracranial fluid collection. Vascular: Atherosclerosis of skullbase vasculature without hyperdense vessel or abnormal calcification. Skull: No fracture or focal lesion. Sinuses/Orbits: No acute findings. Frothy debris within the left sphenoid sinus, chronic. The visualized orbits are unremarkable. Bilateral cataract resection. Other: No large scalp hematoma. IMPRESSION: 1. No acute intracranial abnormality. No skull fracture. 2. Age related atrophy. Electronically Signed   By: Narda Rutherford M.D.   On: 10/09/2023 20:42   DG Shoulder Left Portable  Result Date: 10/09/2023 CLINICAL DATA:  Pain after fall EXAM: LEFT SHOULDER one view; LEFT ELBOW - COMPLETE 4 VIEW; LEFT HUMERUS - 2 VIEW; LEFT FOREARM - 2 VIEW COMPARISON:  None Available. FINDINGS: There is a avulsion fracture of the olecranon with mild displacement associated soft tissue swelling and joint effusion. No additional areas of fracture or dislocation. Preserved overall joint spaces including glenohumeral, AC joint, radiocarpal joint. Severe osteopenia. Overlapping splint. IMPRESSION: There is a avulsion fracture of the olecranon with mild displacement associated soft tissue swelling and joint effusion. Electronically Signed   By: Karen Kays M.D.   On: 10/09/2023 20:36   DG Elbow Complete Left  Result Date: 10/09/2023 CLINICAL DATA:  Pain after fall  EXAM: LEFT SHOULDER one view; LEFT ELBOW - COMPLETE 4 VIEW; LEFT HUMERUS - 2 VIEW; LEFT FOREARM - 2 VIEW COMPARISON:  None Available. FINDINGS: There is a avulsion fracture of the olecranon with mild displacement associated soft tissue swelling and joint effusion. No additional areas of fracture or dislocation. Preserved overall joint spaces including glenohumeral, AC joint, radiocarpal joint. Severe osteopenia. Overlapping splint. IMPRESSION: There is a avulsion fracture of the olecranon with mild displacement associated soft tissue swelling and joint effusion. Electronically Signed   By: Karen Kays M.D.   On: 10/09/2023 20:36   DG Humerus Left  Result Date: 10/09/2023 CLINICAL DATA:  Pain after fall EXAM: LEFT SHOULDER one view; LEFT ELBOW - COMPLETE 4 VIEW; LEFT HUMERUS - 2 VIEW; LEFT FOREARM - 2 VIEW COMPARISON:  None Available. FINDINGS: There is a avulsion fracture of the olecranon with mild displacement associated soft tissue swelling and joint effusion. No additional areas of fracture or dislocation. Preserved overall joint spaces including glenohumeral, AC joint, radiocarpal joint. Severe osteopenia. Overlapping splint. IMPRESSION: There is a avulsion fracture of the olecranon with mild displacement associated soft tissue swelling and joint effusion. Electronically Signed   By: Karen Kays M.D.   On: 10/09/2023 20:36   DG Forearm Left  Result Date: 10/09/2023 CLINICAL DATA:  Pain after fall EXAM: LEFT SHOULDER one view; LEFT ELBOW - COMPLETE 4 VIEW; LEFT HUMERUS - 2 VIEW; LEFT FOREARM - 2 VIEW COMPARISON:  None Available. FINDINGS: There is a avulsion fracture of the olecranon with mild displacement associated soft tissue swelling and joint effusion. No additional areas of fracture or dislocation. Preserved overall joint spaces including glenohumeral, AC joint, radiocarpal joint. Severe osteopenia. Overlapping splint. IMPRESSION: There is a avulsion fracture of the olecranon with mild  displacement associated soft tissue swelling and joint effusion. Electronically Signed   By: Karen Kays M.D.   On: 10/09/2023 20:36  Assessment/Plan 1. Essential hypertension - uncontrolled, goal < 150/90 - asymptomatic - will increase amlodipine to 5 mg - cont olmesartan - manual blood pressures BID x 7 days> give to PCP - amLODipine (NORVASC) 5 MG tablet; Take 1 tablet (5 mg total) by mouth daily.  2. Olecranon fracture, left, closed, with delayed healing, subsequent encounter - 11/21 mechanical fall - 12/03 ORIF by Dr. Eulah Pont - LUE NWB - cont tylenol or pain>not using oxycodone     Family/ staff Communication: plan discussed with patient and nurse  Labs/tests ordered:  BP BID x 7 days

## 2023-10-29 DIAGNOSIS — R2689 Other abnormalities of gait and mobility: Secondary | ICD-10-CM | POA: Diagnosis not present

## 2023-10-29 DIAGNOSIS — S52022D Displaced fracture of olecranon process without intraarticular extension of left ulna, subsequent encounter for closed fracture with routine healing: Secondary | ICD-10-CM | POA: Diagnosis not present

## 2023-10-29 DIAGNOSIS — M63812 Disorders of muscle in diseases classified elsewhere, left shoulder: Secondary | ICD-10-CM | POA: Diagnosis not present

## 2023-10-29 DIAGNOSIS — M6259 Muscle wasting and atrophy, not elsewhere classified, multiple sites: Secondary | ICD-10-CM | POA: Diagnosis not present

## 2023-10-29 DIAGNOSIS — M25522 Pain in left elbow: Secondary | ICD-10-CM | POA: Diagnosis not present

## 2023-10-29 DIAGNOSIS — M25512 Pain in left shoulder: Secondary | ICD-10-CM | POA: Diagnosis not present

## 2023-10-30 DIAGNOSIS — R2689 Other abnormalities of gait and mobility: Secondary | ICD-10-CM | POA: Diagnosis not present

## 2023-10-30 DIAGNOSIS — M25522 Pain in left elbow: Secondary | ICD-10-CM | POA: Diagnosis not present

## 2023-10-30 DIAGNOSIS — M6259 Muscle wasting and atrophy, not elsewhere classified, multiple sites: Secondary | ICD-10-CM | POA: Diagnosis not present

## 2023-10-30 DIAGNOSIS — M63812 Disorders of muscle in diseases classified elsewhere, left shoulder: Secondary | ICD-10-CM | POA: Diagnosis not present

## 2023-10-30 DIAGNOSIS — M25512 Pain in left shoulder: Secondary | ICD-10-CM | POA: Diagnosis not present

## 2023-10-30 DIAGNOSIS — S52022D Displaced fracture of olecranon process without intraarticular extension of left ulna, subsequent encounter for closed fracture with routine healing: Secondary | ICD-10-CM | POA: Diagnosis not present

## 2023-10-31 DIAGNOSIS — M63812 Disorders of muscle in diseases classified elsewhere, left shoulder: Secondary | ICD-10-CM | POA: Diagnosis not present

## 2023-10-31 DIAGNOSIS — M7022 Olecranon bursitis, left elbow: Secondary | ICD-10-CM | POA: Diagnosis not present

## 2023-10-31 DIAGNOSIS — M25512 Pain in left shoulder: Secondary | ICD-10-CM | POA: Diagnosis not present

## 2023-10-31 DIAGNOSIS — R2689 Other abnormalities of gait and mobility: Secondary | ICD-10-CM | POA: Diagnosis not present

## 2023-10-31 DIAGNOSIS — M25522 Pain in left elbow: Secondary | ICD-10-CM | POA: Diagnosis not present

## 2023-10-31 DIAGNOSIS — S52022D Displaced fracture of olecranon process without intraarticular extension of left ulna, subsequent encounter for closed fracture with routine healing: Secondary | ICD-10-CM | POA: Diagnosis not present

## 2023-10-31 DIAGNOSIS — M6259 Muscle wasting and atrophy, not elsewhere classified, multiple sites: Secondary | ICD-10-CM | POA: Diagnosis not present

## 2023-11-03 DIAGNOSIS — M25522 Pain in left elbow: Secondary | ICD-10-CM | POA: Diagnosis not present

## 2023-11-03 DIAGNOSIS — M63812 Disorders of muscle in diseases classified elsewhere, left shoulder: Secondary | ICD-10-CM | POA: Diagnosis not present

## 2023-11-03 DIAGNOSIS — M25512 Pain in left shoulder: Secondary | ICD-10-CM | POA: Diagnosis not present

## 2023-11-03 DIAGNOSIS — M6259 Muscle wasting and atrophy, not elsewhere classified, multiple sites: Secondary | ICD-10-CM | POA: Diagnosis not present

## 2023-11-03 DIAGNOSIS — R2689 Other abnormalities of gait and mobility: Secondary | ICD-10-CM | POA: Diagnosis not present

## 2023-11-03 DIAGNOSIS — S52022D Displaced fracture of olecranon process without intraarticular extension of left ulna, subsequent encounter for closed fracture with routine healing: Secondary | ICD-10-CM | POA: Diagnosis not present

## 2023-11-04 ENCOUNTER — Ambulatory Visit: Payer: Medicare Other | Admitting: Internal Medicine

## 2023-11-04 DIAGNOSIS — M25512 Pain in left shoulder: Secondary | ICD-10-CM | POA: Diagnosis not present

## 2023-11-04 DIAGNOSIS — R2689 Other abnormalities of gait and mobility: Secondary | ICD-10-CM | POA: Diagnosis not present

## 2023-11-04 DIAGNOSIS — M25522 Pain in left elbow: Secondary | ICD-10-CM | POA: Diagnosis not present

## 2023-11-04 DIAGNOSIS — S52022D Displaced fracture of olecranon process without intraarticular extension of left ulna, subsequent encounter for closed fracture with routine healing: Secondary | ICD-10-CM | POA: Diagnosis not present

## 2023-11-04 DIAGNOSIS — M63812 Disorders of muscle in diseases classified elsewhere, left shoulder: Secondary | ICD-10-CM | POA: Diagnosis not present

## 2023-11-04 DIAGNOSIS — M6259 Muscle wasting and atrophy, not elsewhere classified, multiple sites: Secondary | ICD-10-CM | POA: Diagnosis not present

## 2023-11-05 ENCOUNTER — Ambulatory Visit: Payer: Medicare Other | Admitting: Physical Therapy

## 2023-11-05 DIAGNOSIS — M63812 Disorders of muscle in diseases classified elsewhere, left shoulder: Secondary | ICD-10-CM | POA: Diagnosis not present

## 2023-11-05 DIAGNOSIS — M25522 Pain in left elbow: Secondary | ICD-10-CM | POA: Diagnosis not present

## 2023-11-05 DIAGNOSIS — R2689 Other abnormalities of gait and mobility: Secondary | ICD-10-CM | POA: Diagnosis not present

## 2023-11-05 DIAGNOSIS — S52022D Displaced fracture of olecranon process without intraarticular extension of left ulna, subsequent encounter for closed fracture with routine healing: Secondary | ICD-10-CM | POA: Diagnosis not present

## 2023-11-05 DIAGNOSIS — M25512 Pain in left shoulder: Secondary | ICD-10-CM | POA: Diagnosis not present

## 2023-11-05 DIAGNOSIS — M6259 Muscle wasting and atrophy, not elsewhere classified, multiple sites: Secondary | ICD-10-CM | POA: Diagnosis not present

## 2023-11-06 DIAGNOSIS — S52022D Displaced fracture of olecranon process without intraarticular extension of left ulna, subsequent encounter for closed fracture with routine healing: Secondary | ICD-10-CM | POA: Diagnosis not present

## 2023-11-06 DIAGNOSIS — M810 Age-related osteoporosis without current pathological fracture: Secondary | ICD-10-CM | POA: Diagnosis not present

## 2023-11-06 DIAGNOSIS — M63812 Disorders of muscle in diseases classified elsewhere, left shoulder: Secondary | ICD-10-CM | POA: Diagnosis not present

## 2023-11-06 DIAGNOSIS — M6259 Muscle wasting and atrophy, not elsewhere classified, multiple sites: Secondary | ICD-10-CM | POA: Diagnosis not present

## 2023-11-06 DIAGNOSIS — M25512 Pain in left shoulder: Secondary | ICD-10-CM | POA: Diagnosis not present

## 2023-11-06 DIAGNOSIS — R2689 Other abnormalities of gait and mobility: Secondary | ICD-10-CM | POA: Diagnosis not present

## 2023-11-06 DIAGNOSIS — M25522 Pain in left elbow: Secondary | ICD-10-CM | POA: Diagnosis not present

## 2023-11-07 ENCOUNTER — Ambulatory Visit: Payer: Medicare Other | Admitting: Physician Assistant

## 2023-11-07 DIAGNOSIS — S52022D Displaced fracture of olecranon process without intraarticular extension of left ulna, subsequent encounter for closed fracture with routine healing: Secondary | ICD-10-CM | POA: Diagnosis not present

## 2023-11-07 DIAGNOSIS — M63812 Disorders of muscle in diseases classified elsewhere, left shoulder: Secondary | ICD-10-CM | POA: Diagnosis not present

## 2023-11-07 DIAGNOSIS — R2689 Other abnormalities of gait and mobility: Secondary | ICD-10-CM | POA: Diagnosis not present

## 2023-11-07 DIAGNOSIS — M6259 Muscle wasting and atrophy, not elsewhere classified, multiple sites: Secondary | ICD-10-CM | POA: Diagnosis not present

## 2023-11-07 DIAGNOSIS — M25512 Pain in left shoulder: Secondary | ICD-10-CM | POA: Diagnosis not present

## 2023-11-07 DIAGNOSIS — M25522 Pain in left elbow: Secondary | ICD-10-CM | POA: Diagnosis not present

## 2023-11-08 DIAGNOSIS — M6259 Muscle wasting and atrophy, not elsewhere classified, multiple sites: Secondary | ICD-10-CM | POA: Diagnosis not present

## 2023-11-08 DIAGNOSIS — R2689 Other abnormalities of gait and mobility: Secondary | ICD-10-CM | POA: Diagnosis not present

## 2023-11-08 DIAGNOSIS — M25522 Pain in left elbow: Secondary | ICD-10-CM | POA: Diagnosis not present

## 2023-11-08 DIAGNOSIS — S52022D Displaced fracture of olecranon process without intraarticular extension of left ulna, subsequent encounter for closed fracture with routine healing: Secondary | ICD-10-CM | POA: Diagnosis not present

## 2023-11-08 DIAGNOSIS — M63812 Disorders of muscle in diseases classified elsewhere, left shoulder: Secondary | ICD-10-CM | POA: Diagnosis not present

## 2023-11-08 DIAGNOSIS — M25512 Pain in left shoulder: Secondary | ICD-10-CM | POA: Diagnosis not present

## 2023-11-09 DIAGNOSIS — M6259 Muscle wasting and atrophy, not elsewhere classified, multiple sites: Secondary | ICD-10-CM | POA: Diagnosis not present

## 2023-11-09 DIAGNOSIS — M63812 Disorders of muscle in diseases classified elsewhere, left shoulder: Secondary | ICD-10-CM | POA: Diagnosis not present

## 2023-11-09 DIAGNOSIS — S52022D Displaced fracture of olecranon process without intraarticular extension of left ulna, subsequent encounter for closed fracture with routine healing: Secondary | ICD-10-CM | POA: Diagnosis not present

## 2023-11-09 DIAGNOSIS — M25512 Pain in left shoulder: Secondary | ICD-10-CM | POA: Diagnosis not present

## 2023-11-09 DIAGNOSIS — M25522 Pain in left elbow: Secondary | ICD-10-CM | POA: Diagnosis not present

## 2023-11-09 DIAGNOSIS — R2689 Other abnormalities of gait and mobility: Secondary | ICD-10-CM | POA: Diagnosis not present

## 2023-11-10 DIAGNOSIS — M63812 Disorders of muscle in diseases classified elsewhere, left shoulder: Secondary | ICD-10-CM | POA: Diagnosis not present

## 2023-11-10 DIAGNOSIS — S52022D Displaced fracture of olecranon process without intraarticular extension of left ulna, subsequent encounter for closed fracture with routine healing: Secondary | ICD-10-CM | POA: Diagnosis not present

## 2023-11-10 DIAGNOSIS — R2689 Other abnormalities of gait and mobility: Secondary | ICD-10-CM | POA: Diagnosis not present

## 2023-11-10 DIAGNOSIS — M25512 Pain in left shoulder: Secondary | ICD-10-CM | POA: Diagnosis not present

## 2023-11-10 DIAGNOSIS — M25522 Pain in left elbow: Secondary | ICD-10-CM | POA: Diagnosis not present

## 2023-11-10 DIAGNOSIS — M6259 Muscle wasting and atrophy, not elsewhere classified, multiple sites: Secondary | ICD-10-CM | POA: Diagnosis not present

## 2023-11-11 DIAGNOSIS — M63812 Disorders of muscle in diseases classified elsewhere, left shoulder: Secondary | ICD-10-CM | POA: Diagnosis not present

## 2023-11-11 DIAGNOSIS — M6259 Muscle wasting and atrophy, not elsewhere classified, multiple sites: Secondary | ICD-10-CM | POA: Diagnosis not present

## 2023-11-11 DIAGNOSIS — M25512 Pain in left shoulder: Secondary | ICD-10-CM | POA: Diagnosis not present

## 2023-11-11 DIAGNOSIS — M25522 Pain in left elbow: Secondary | ICD-10-CM | POA: Diagnosis not present

## 2023-11-11 DIAGNOSIS — R2689 Other abnormalities of gait and mobility: Secondary | ICD-10-CM | POA: Diagnosis not present

## 2023-11-11 DIAGNOSIS — S52022D Displaced fracture of olecranon process without intraarticular extension of left ulna, subsequent encounter for closed fracture with routine healing: Secondary | ICD-10-CM | POA: Diagnosis not present

## 2023-11-13 DIAGNOSIS — M63812 Disorders of muscle in diseases classified elsewhere, left shoulder: Secondary | ICD-10-CM | POA: Diagnosis not present

## 2023-11-13 DIAGNOSIS — S52022D Displaced fracture of olecranon process without intraarticular extension of left ulna, subsequent encounter for closed fracture with routine healing: Secondary | ICD-10-CM | POA: Diagnosis not present

## 2023-11-13 DIAGNOSIS — M25512 Pain in left shoulder: Secondary | ICD-10-CM | POA: Diagnosis not present

## 2023-11-13 DIAGNOSIS — M6259 Muscle wasting and atrophy, not elsewhere classified, multiple sites: Secondary | ICD-10-CM | POA: Diagnosis not present

## 2023-11-13 DIAGNOSIS — R2689 Other abnormalities of gait and mobility: Secondary | ICD-10-CM | POA: Diagnosis not present

## 2023-11-13 DIAGNOSIS — M25522 Pain in left elbow: Secondary | ICD-10-CM | POA: Diagnosis not present

## 2023-11-14 ENCOUNTER — Non-Acute Institutional Stay (SKILLED_NURSING_FACILITY): Payer: Self-pay | Admitting: Adult Health

## 2023-11-14 ENCOUNTER — Encounter: Payer: Self-pay | Admitting: Adult Health

## 2023-11-14 DIAGNOSIS — M6259 Muscle wasting and atrophy, not elsewhere classified, multiple sites: Secondary | ICD-10-CM | POA: Diagnosis not present

## 2023-11-14 DIAGNOSIS — M63812 Disorders of muscle in diseases classified elsewhere, left shoulder: Secondary | ICD-10-CM | POA: Diagnosis not present

## 2023-11-14 DIAGNOSIS — R2689 Other abnormalities of gait and mobility: Secondary | ICD-10-CM | POA: Diagnosis not present

## 2023-11-14 DIAGNOSIS — I10 Essential (primary) hypertension: Secondary | ICD-10-CM

## 2023-11-14 DIAGNOSIS — M25522 Pain in left elbow: Secondary | ICD-10-CM | POA: Diagnosis not present

## 2023-11-14 DIAGNOSIS — K5904 Chronic idiopathic constipation: Secondary | ICD-10-CM | POA: Diagnosis not present

## 2023-11-14 DIAGNOSIS — S52022G Displaced fracture of olecranon process without intraarticular extension of left ulna, subsequent encounter for closed fracture with delayed healing: Secondary | ICD-10-CM

## 2023-11-14 DIAGNOSIS — M25512 Pain in left shoulder: Secondary | ICD-10-CM | POA: Diagnosis not present

## 2023-11-14 DIAGNOSIS — S52022D Displaced fracture of olecranon process without intraarticular extension of left ulna, subsequent encounter for closed fracture with routine healing: Secondary | ICD-10-CM | POA: Diagnosis not present

## 2023-11-14 MED ORDER — SENNA 8.6 MG PO TABS: 2.0000 | ORAL_TABLET | Freq: Every evening | ORAL | Status: DC | PRN

## 2023-11-14 MED ORDER — POLYETHYLENE GLYCOL 3350 17 GM/SCOOP PO POWD: 17.0000 g | Freq: Every day | ORAL | Status: DC

## 2023-11-14 NOTE — Progress Notes (Signed)
Location:  [facility] Room Number: 159A Place of Service:  SNF [phone]) Provider:  Tally Joe  Plotnikov, Georgina Quint, MD  Patient Care Team: Tresa Garter, MD as PCP - General (Internal Medicine) Jodelle Red, MD as PCP - Cardiology (Cardiology) [place], Amy, MD (Dermatology) [place], [place] Lowella Bandy., MD (Urology) Romie Levee, MD as Consulting Physician (General Surgery) Napoleon Form, MD as Consulting Physician (Gastroenterology) Melina Fiddler, MD as Consulting Physician (Sports Medicine)  Extended Emergency Contact Information Primary Emergency Contact: Brackney,Nick Mobile Phone: [phone] Relation: Son  Code Status:  DNR Goals of care: Advanced Directive information    11/14/2023    9:21 AM  Advanced Directives  Does Patient Have a Medical Advance Directive? Yes  Type of Advance Directive Living will;Out of facility DNR (pink MOST or yellow form)  Does patient want to make changes to medical advance directive? No - Patient declined     Chief Complaint  Patient presents with   Medical Management of Chronic Issues    Patient is being seen for follow up for HTN    HPI:  Pt is a 82 y.o. female seen today for follow up regarding HTN and rehab stay  She was seen in the ER on 10/09/23. She had a mechanical fall and struck her head and left elbow and shoulder. BP was elevated in the ED.  CT of the head showed age related atrophy but no acute abnormality Xray of the left forearm showed There is a avulsion fracture of the olecranon with mild displacement associated soft tissue swelling and joint effusion.  She was diagnosed with a left shoulder sprain and an olecranon fracture Underwent ORIF completed 10/21/23 She remains NWB to the LUE Needs help with dressing and bathing  Reports a few days of constipation then blowouts. Taking senna each night but refuses sometimes and miralax prn Pain is well  controlled with tylenol. Prn oxy is ordered but not currently being used.  Denies numbness or tingling of the LUE BP is improved after increasing norvasc to 5 mg on 12/10. 109/71, 135/81, 148/73 No edema but says sometimes her left ankle swells.  Feels weak when bp is low, fine when its high.   Past Medical History:  Diagnosis Date   Abdominal pain, unspecified site    ADVERSE REACTION TO MEDICATION 07/31/2009   ANEMIA-NOS 07/07/2007   Anxiety    Arthritis    Chest pain, unspecified    Chronic kidney disease    hx of kidney stone   COLONIC POLYPS, ADENOMATOUS, HX OF    Complication of anesthesia    Dementia (HCC)    Disturbance of skin sensation    DIVERTICULOSIS, COLON 02/23/2009   Dyspnea    Family history of diabetes mellitus    Family history of malignant neoplasm of breast    FATIGUE 05/25/2009   Fever, unspecified    Heart murmur    hx of   HYPERLIPIDEMIA 07/07/2007   HYPERPARATHYROIDISM UNSPECIFIED 10/20/2007   HYPERTENSION 02/04/2008   Incomplete right bundle branch block (RBBB) determined by electrocardiography 04/15/2023   Irritable bowel syndrome (IBS)    NEPHROLITHIASIS, HX OF 11/15/2008   OSTEOPOROSIS 01/01/2008   Other acquired absence of organ    parathyroidectomy   Pain in limb    PALPITATIONS, OCCASIONAL 07/12/2008   PARATHYROIDECTOMY 01/02/2009   PONV (postoperative nausea and vomiting)    SCOLIOSIS 05/25/2009   Seasonal allergies    Unspecified constipation    URINARY INCONTINENCE 07/07/2007  UTI'S, RECURRENT 11/20/2009   kimbrough    Past Surgical History:  Procedure Laterality Date   BUNIONECTOMY     CATARACT EXTRACTION, BILATERAL     COLONOSCOPY     EXTRACORPOREAL SHOCK WAVE LITHOTRIPSY Left 12/21/2020   Procedure: EXTRACORPOREAL SHOCK WAVE LITHOTRIPSY (ESWL);  Surgeon: Jerilee Field, MD;  Location: [facility];  Service: Urology;  Laterality: Left;   LAPAROSCOPIC APPENDECTOMY  01/19/2016   Procedure: APPENDECTOMY  LAPAROSCOPIC with orifice of cecal polyp;  Surgeon: Romie Levee, MD;  Location: [facility];  Service: General;;   LEFT HEART CATH AND CORONARY ANGIOGRAPHY N/A 04/07/2017   Procedure: Left Heart Cath and Coronary Angiography;  Surgeon: [name], MD;  Location: [facility];  Service: Cardiovascular;  Laterality: N/A;   OLECRANON BURSECTOMY Left 10/21/2023   Procedure: OLECRANON (ELBOW) BURSA;  Surgeon: Sheral Apley, MD;  Location: [facility];  Service: Orthopedics;  Laterality: Left;   ORIF ELBOW FRACTURE Left 10/21/2023   Procedure: Open Reduction Internal fixation (ORIF), fracture, elbow/olecranon;  Surgeon: Sheral Apley, MD;  Location: [facility];  Service: Orthopedics;  Laterality: Left;   PARATHYROIDECTOMY     Rt Superior open neck exploration   POLYPECTOMY     RIGHT OOPHORECTOMY     TONSILLECTOMY      Allergies  Allergen Reactions   Anesthetics, Amide Other (See Comments)    Pt states she had hallucinations and HTN after procedure 01/19/16-she feels may have been reaction to anesthetic   Lisinopril Cough    Outpatient Encounter Medications as of 11/14/2023  Medication Sig   acetaminophen (TYLENOL) 500 MG tablet Take 2 tablets (1,000 mg total) by mouth 2 (two) times daily.   acetaminophen (TYLENOL) 500 MG tablet Take 2 tablets (1,000 mg total) by mouth daily as needed.   amLODipine (NORVASC) 5 MG tablet Take 1 tablet (5 mg total) by mouth daily.   b complex vitamins capsule Take 1 capsule by mouth daily.   Cholecalciferol (VITAMIN D3) 125 MCG (5000 UT) CAPS Take 1 capsule (5,000 Units total) by mouth daily.   denosumab (PROLIA) 60 MG/ML SOSY injection Inject 60 mg into the skin every 6 (six) months.   LORazepam (ATIVAN) 0.5 MG tablet Take 0.5 mg by mouth 2 (two) times daily as needed for anxiety.   olmesartan (BENICAR) 20 MG tablet Take 1 tablet (20 mg total) by mouth 2 (two) times daily.   ondansetron (ZOFRAN-ODT) 4 MG disintegrating tablet Take 4 mg by mouth every 8  (eight) hours as needed for nausea or vomiting.   oxycodone (OXY-IR) 5 MG capsule Take 2.5 mg by mouth every 4 (four) hours as needed for pain.   polyethylene glycol (MIRALAX / GLYCOLAX) 17 g packet Take 17 g by mouth daily as needed for mild constipation.   senna (SENOKOT) 8.6 MG TABS tablet Take 2 tablets (17.2 mg total) by mouth at bedtime.   traZODone (DESYREL) 50 MG tablet Take 25 mg by mouth at bedtime.   No facility-administered encounter medications on file as of 11/14/2023.    Review of Systems  Constitutional:  Negative for activity change, appetite change, chills, diaphoresis, fatigue, fever and unexpected weight change.  HENT:  Negative for congestion.   Respiratory:  Negative for cough, shortness of breath and wheezing.   Cardiovascular:  Negative for chest pain, palpitations and leg swelling.  Gastrointestinal:  Negative for abdominal distention, abdominal pain, constipation and diarrhea.  Genitourinary:  Negative for difficulty urinating and dysuria.  Musculoskeletal:  Positive for arthralgias and  gait problem. Negative for back pain, joint swelling and myalgias.  Neurological:  Negative for dizziness, tremors, seizures, syncope, facial asymmetry, speech difficulty, weakness, light-headedness, numbness and headaches.  Psychiatric/Behavioral:  Negative for agitation, behavioral problems and confusion.        Some memory loss.     Immunization History  Administered Date(s) Administered   Fluad Trivalent(High Dose 65+) 08/04/2023   Influenza Split 09/17/2011, 11/08/2015, 09/12/2017, 08/02/2020   Influenza Whole 08/19/2008   Influenza, High Dose Seasonal PF 08/20/2016, 09/12/2017, 08/21/2018, 09/28/2019, 10/12/2021, 09/01/2022   Influenza-Unspecified 09/18/2013, 08/18/2014, 11/08/2015, 09/15/2020   Moderna SARS-COV2 Booster Vaccination 07/03/2021   Moderna Sars-Covid-2 Vaccination 11/30/2019, 12/31/2019, 10/26/2020, 07/03/2021, 04/22/2022   Pneumococcal Conjugate-13  12/29/2013   Pneumococcal Polysaccharide-23 11/08/2015   Tdap 01/05/2015   Zoster Recombinant(Shingrix) 06/20/2021, 09/05/2021   Pertinent  Health Maintenance Due  Topic Date Due   INFLUENZA VACCINE  Completed   DEXA SCAN  Completed   Colonoscopy  Discontinued      03/18/2023    2:49 PM 03/31/2023   11:10 AM 04/23/2023   11:26 AM 08/04/2023    3:04 PM 10/14/2023    1:46 PM  Fall Risk  Falls in the past year? 1 0 0 0 1  Was there an injury with Fall? 1 0 0 0 1  Fall Risk Category Calculator 2 0 0 0 2  Patient at Risk for Falls Due to History of fall(s) No Fall Risks No Fall Risks No Fall Risks History of fall(s);Impaired balance/gait  Fall risk Follow up Falls evaluation completed Falls evaluation completed Falls evaluation completed Falls evaluation completed Falls evaluation completed;Education provided;Falls prevention discussed   Functional Status Survey:    Vitals:   11/14/23 0916  BP: 109/71  Pulse: 65  Resp: 17  Temp: 98.8 F (37.1 C)  TempSrc: Temporal  SpO2: 97%  Weight: 111 lb 3.2 oz (50.4 kg)  Height: 4\' 10"  (1.473 m)   Body mass index is 23.24 kg/m. Physical Exam Vitals and nursing note reviewed.  Constitutional:      Appearance: Normal appearance.  Cardiovascular:     Rate and Rhythm: Normal rate and regular rhythm.  Pulmonary:     Effort: Pulmonary effort is normal.     Breath sounds: Normal breath sounds.  Abdominal:     General: Abdomen is flat. Bowel sounds are normal.     Palpations: Abdomen is soft.  Musculoskeletal:     Right lower leg: No edema.     Left lower leg: No edema.  Skin:    General: Skin is warm and dry.  Neurological:     Mental Status: She is alert and oriented to person, place, and time. Mental status is at baseline.  Psychiatric:        Mood and Affect: Mood normal.     Labs reviewed: Recent Labs    11/22/22 1137 03/18/23 1544 10/21/23 0657  NA 141 142 138  K 3.9 4.1 3.9  CL 107 108 108  CO2 26 26 23   GLUCOSE 87  103* 95  BUN 17 18 19   CREATININE 0.77 0.81 0.66  CALCIUM 9.3 9.3 9.2   Recent Labs    11/22/22 1137 03/18/23 1544  AST 21 19  ALT 15 12  ALKPHOS 51 59  BILITOT 1.0 0.8  PROT 6.5 6.5  ALBUMIN 4.1 4.0   Recent Labs    10/21/23 0657  WBC 5.7  HGB 12.6  HCT 38.5  MCV 98.5  PLT 213   Lab Results  Component  Value Date   TSH 1.20 03/18/2023   No results found for: "HGBA1C" Lab Results  Component Value Date   CHOL 132 01/25/2015   HDL 68 01/25/2015   LDLCALC 51 01/25/2015   LDLDIRECT 156.3 03/28/2011   TRIG 64 01/25/2015   CHOLHDL 1.9 01/25/2015    Significant Diagnostic Results in last 30 days:  No results found.  Assessment/Plan  1. Essential hypertension (Primary) Improved Continue Norvasc 5 mg and Benicar.   2. Olecranon fracture, left, closed, with delayed healing, subsequent encounter S/p ORIF NWB Working with therapy F/U with ortho as indicated.  Needs to stay in rehab or have home care to help with dressing.   3. Slow transit constipation Add miralax daily to keep regular but hold for loose stools.  Change senna to prn (refuses regularly and has blowouts)   Family/ staff Communication: resident and nurse.   Labs/tests ordered:  NA

## 2023-11-15 DIAGNOSIS — M25522 Pain in left elbow: Secondary | ICD-10-CM | POA: Diagnosis not present

## 2023-11-15 DIAGNOSIS — S52022D Displaced fracture of olecranon process without intraarticular extension of left ulna, subsequent encounter for closed fracture with routine healing: Secondary | ICD-10-CM | POA: Diagnosis not present

## 2023-11-15 DIAGNOSIS — M63812 Disorders of muscle in diseases classified elsewhere, left shoulder: Secondary | ICD-10-CM | POA: Diagnosis not present

## 2023-11-15 DIAGNOSIS — M6259 Muscle wasting and atrophy, not elsewhere classified, multiple sites: Secondary | ICD-10-CM | POA: Diagnosis not present

## 2023-11-15 DIAGNOSIS — M25512 Pain in left shoulder: Secondary | ICD-10-CM | POA: Diagnosis not present

## 2023-11-15 DIAGNOSIS — R2689 Other abnormalities of gait and mobility: Secondary | ICD-10-CM | POA: Diagnosis not present

## 2023-11-16 DIAGNOSIS — M6259 Muscle wasting and atrophy, not elsewhere classified, multiple sites: Secondary | ICD-10-CM | POA: Diagnosis not present

## 2023-11-16 DIAGNOSIS — S52022D Displaced fracture of olecranon process without intraarticular extension of left ulna, subsequent encounter for closed fracture with routine healing: Secondary | ICD-10-CM | POA: Diagnosis not present

## 2023-11-16 DIAGNOSIS — R2689 Other abnormalities of gait and mobility: Secondary | ICD-10-CM | POA: Diagnosis not present

## 2023-11-16 DIAGNOSIS — M25512 Pain in left shoulder: Secondary | ICD-10-CM | POA: Diagnosis not present

## 2023-11-16 DIAGNOSIS — M25522 Pain in left elbow: Secondary | ICD-10-CM | POA: Diagnosis not present

## 2023-11-16 DIAGNOSIS — M63812 Disorders of muscle in diseases classified elsewhere, left shoulder: Secondary | ICD-10-CM | POA: Diagnosis not present

## 2023-11-17 DIAGNOSIS — M6259 Muscle wasting and atrophy, not elsewhere classified, multiple sites: Secondary | ICD-10-CM | POA: Diagnosis not present

## 2023-11-17 DIAGNOSIS — M25512 Pain in left shoulder: Secondary | ICD-10-CM | POA: Diagnosis not present

## 2023-11-17 DIAGNOSIS — M63812 Disorders of muscle in diseases classified elsewhere, left shoulder: Secondary | ICD-10-CM | POA: Diagnosis not present

## 2023-11-17 DIAGNOSIS — M25522 Pain in left elbow: Secondary | ICD-10-CM | POA: Diagnosis not present

## 2023-11-17 DIAGNOSIS — R2689 Other abnormalities of gait and mobility: Secondary | ICD-10-CM | POA: Diagnosis not present

## 2023-11-17 DIAGNOSIS — S52022D Displaced fracture of olecranon process without intraarticular extension of left ulna, subsequent encounter for closed fracture with routine healing: Secondary | ICD-10-CM | POA: Diagnosis not present

## 2023-11-18 DIAGNOSIS — M25512 Pain in left shoulder: Secondary | ICD-10-CM | POA: Diagnosis not present

## 2023-11-18 DIAGNOSIS — M25522 Pain in left elbow: Secondary | ICD-10-CM | POA: Diagnosis not present

## 2023-11-18 DIAGNOSIS — S52022D Displaced fracture of olecranon process without intraarticular extension of left ulna, subsequent encounter for closed fracture with routine healing: Secondary | ICD-10-CM | POA: Diagnosis not present

## 2023-11-18 DIAGNOSIS — M63812 Disorders of muscle in diseases classified elsewhere, left shoulder: Secondary | ICD-10-CM | POA: Diagnosis not present

## 2023-11-18 DIAGNOSIS — M6259 Muscle wasting and atrophy, not elsewhere classified, multiple sites: Secondary | ICD-10-CM | POA: Diagnosis not present

## 2023-11-18 DIAGNOSIS — R2689 Other abnormalities of gait and mobility: Secondary | ICD-10-CM | POA: Diagnosis not present

## 2023-11-19 DIAGNOSIS — R600 Localized edema: Secondary | ICD-10-CM | POA: Diagnosis not present

## 2023-11-19 DIAGNOSIS — M63832 Disorders of muscle in diseases classified elsewhere, left forearm: Secondary | ICD-10-CM | POA: Diagnosis not present

## 2023-11-19 DIAGNOSIS — S52022D Displaced fracture of olecranon process without intraarticular extension of left ulna, subsequent encounter for closed fracture with routine healing: Secondary | ICD-10-CM | POA: Diagnosis not present

## 2023-11-19 DIAGNOSIS — M25512 Pain in left shoulder: Secondary | ICD-10-CM | POA: Diagnosis not present

## 2023-11-19 DIAGNOSIS — R4189 Other symptoms and signs involving cognitive functions and awareness: Secondary | ICD-10-CM | POA: Diagnosis not present

## 2023-11-19 DIAGNOSIS — R278 Other lack of coordination: Secondary | ICD-10-CM | POA: Diagnosis not present

## 2023-11-19 DIAGNOSIS — R2689 Other abnormalities of gait and mobility: Secondary | ICD-10-CM | POA: Diagnosis not present

## 2023-11-19 DIAGNOSIS — M6259 Muscle wasting and atrophy, not elsewhere classified, multiple sites: Secondary | ICD-10-CM | POA: Diagnosis not present

## 2023-11-19 DIAGNOSIS — M25522 Pain in left elbow: Secondary | ICD-10-CM | POA: Diagnosis not present

## 2023-11-19 DIAGNOSIS — M63812 Disorders of muscle in diseases classified elsewhere, left shoulder: Secondary | ICD-10-CM | POA: Diagnosis not present

## 2023-11-19 DIAGNOSIS — Z9181 History of falling: Secondary | ICD-10-CM | POA: Diagnosis not present

## 2023-11-19 DIAGNOSIS — S43492D Other sprain of left shoulder joint, subsequent encounter: Secondary | ICD-10-CM | POA: Diagnosis not present

## 2023-11-20 DIAGNOSIS — M25522 Pain in left elbow: Secondary | ICD-10-CM | POA: Diagnosis not present

## 2023-11-20 DIAGNOSIS — R2689 Other abnormalities of gait and mobility: Secondary | ICD-10-CM | POA: Diagnosis not present

## 2023-11-20 DIAGNOSIS — M25512 Pain in left shoulder: Secondary | ICD-10-CM | POA: Diagnosis not present

## 2023-11-20 DIAGNOSIS — M63812 Disorders of muscle in diseases classified elsewhere, left shoulder: Secondary | ICD-10-CM | POA: Diagnosis not present

## 2023-11-20 DIAGNOSIS — S52022D Displaced fracture of olecranon process without intraarticular extension of left ulna, subsequent encounter for closed fracture with routine healing: Secondary | ICD-10-CM | POA: Diagnosis not present

## 2023-11-20 DIAGNOSIS — M6259 Muscle wasting and atrophy, not elsewhere classified, multiple sites: Secondary | ICD-10-CM | POA: Diagnosis not present

## 2023-11-21 DIAGNOSIS — R2689 Other abnormalities of gait and mobility: Secondary | ICD-10-CM | POA: Diagnosis not present

## 2023-11-21 DIAGNOSIS — M6259 Muscle wasting and atrophy, not elsewhere classified, multiple sites: Secondary | ICD-10-CM | POA: Diagnosis not present

## 2023-11-21 DIAGNOSIS — M25522 Pain in left elbow: Secondary | ICD-10-CM | POA: Diagnosis not present

## 2023-11-21 DIAGNOSIS — M25512 Pain in left shoulder: Secondary | ICD-10-CM | POA: Diagnosis not present

## 2023-11-21 DIAGNOSIS — M7022 Olecranon bursitis, left elbow: Secondary | ICD-10-CM | POA: Diagnosis not present

## 2023-11-21 DIAGNOSIS — M63812 Disorders of muscle in diseases classified elsewhere, left shoulder: Secondary | ICD-10-CM | POA: Diagnosis not present

## 2023-11-21 DIAGNOSIS — S52022D Displaced fracture of olecranon process without intraarticular extension of left ulna, subsequent encounter for closed fracture with routine healing: Secondary | ICD-10-CM | POA: Diagnosis not present

## 2023-11-22 DIAGNOSIS — M6259 Muscle wasting and atrophy, not elsewhere classified, multiple sites: Secondary | ICD-10-CM | POA: Diagnosis not present

## 2023-11-22 DIAGNOSIS — M25512 Pain in left shoulder: Secondary | ICD-10-CM | POA: Diagnosis not present

## 2023-11-22 DIAGNOSIS — S52022D Displaced fracture of olecranon process without intraarticular extension of left ulna, subsequent encounter for closed fracture with routine healing: Secondary | ICD-10-CM | POA: Diagnosis not present

## 2023-11-22 DIAGNOSIS — M63812 Disorders of muscle in diseases classified elsewhere, left shoulder: Secondary | ICD-10-CM | POA: Diagnosis not present

## 2023-11-22 DIAGNOSIS — M25522 Pain in left elbow: Secondary | ICD-10-CM | POA: Diagnosis not present

## 2023-11-22 DIAGNOSIS — R2689 Other abnormalities of gait and mobility: Secondary | ICD-10-CM | POA: Diagnosis not present

## 2023-11-24 ENCOUNTER — Other Ambulatory Visit: Payer: Self-pay | Admitting: Internal Medicine

## 2023-11-24 ENCOUNTER — Encounter: Payer: Self-pay | Admitting: Internal Medicine

## 2023-11-24 ENCOUNTER — Non-Acute Institutional Stay (SKILLED_NURSING_FACILITY): Payer: Self-pay | Admitting: Internal Medicine

## 2023-11-24 DIAGNOSIS — Z9889 Other specified postprocedural states: Secondary | ICD-10-CM | POA: Diagnosis not present

## 2023-11-24 DIAGNOSIS — S52022D Displaced fracture of olecranon process without intraarticular extension of left ulna, subsequent encounter for closed fracture with routine healing: Secondary | ICD-10-CM | POA: Diagnosis not present

## 2023-11-24 DIAGNOSIS — I10 Essential (primary) hypertension: Secondary | ICD-10-CM | POA: Diagnosis not present

## 2023-11-24 DIAGNOSIS — Z8781 Personal history of (healed) traumatic fracture: Secondary | ICD-10-CM | POA: Diagnosis not present

## 2023-11-24 DIAGNOSIS — M6259 Muscle wasting and atrophy, not elsewhere classified, multiple sites: Secondary | ICD-10-CM | POA: Diagnosis not present

## 2023-11-24 DIAGNOSIS — K5904 Chronic idiopathic constipation: Secondary | ICD-10-CM

## 2023-11-24 DIAGNOSIS — M25522 Pain in left elbow: Secondary | ICD-10-CM | POA: Diagnosis not present

## 2023-11-24 DIAGNOSIS — R2689 Other abnormalities of gait and mobility: Secondary | ICD-10-CM | POA: Diagnosis not present

## 2023-11-24 DIAGNOSIS — M25512 Pain in left shoulder: Secondary | ICD-10-CM | POA: Diagnosis not present

## 2023-11-24 DIAGNOSIS — M63812 Disorders of muscle in diseases classified elsewhere, left shoulder: Secondary | ICD-10-CM | POA: Diagnosis not present

## 2023-11-24 MED ORDER — LORAZEPAM 0.5 MG PO TABS: 0.5000 mg | ORAL_TABLET | Freq: Two times a day (BID) | ORAL | 1 refills | Status: DC | PRN

## 2023-11-24 NOTE — Progress Notes (Signed)
 Location: [facility]:  SNF (31)  Provider:   Code Status:  Goals of Care:     11/14/2023    9:21 AM  Advanced Directives  Does Patient Have a Medical Advance Directive? Yes  Type of Advance Directive Living will;Out of facility DNR (pink MOST or yellow form)  Does patient want to make changes to medical advance directive? No - Patient declined     Chief Complaint  Patient presents with   Acute Visit    HPI: Patient is a 83 y.o. female seen today for an acute visit for Fluctuating BP and Constipation  Patient has h/o Labile HTN, CAD, PAF HLD   She fell in her Apartment when her Vannie got caught up in door Was found to have Avulsion Fracture of Olecranon with Displacement Underwent  ORIF on 12/03  Labile BP Patient is on Benicar  twice a day and amlodipine .  She has been doing well for last 1 week but this morning her systolic blood pressure was above 180.  She got both Benicar  and amlodipine  and then she started feeling dizzy her blood pressure systolic dropped to 100.  She was unable to do her therapy.   Constipation This is her chronic problem to.  Right now is taking MiraLAX .  Did have a good bowel movement yesterday.  Is concerned that she did not have any this today.  Denies any abdominal pain nausea or vomiting  Patient also thinks that she can go back to her apartment with some caregiver help   Past Medical History:  Diagnosis Date   Abdominal pain, unspecified site    ADVERSE REACTION TO MEDICATION 07/31/2009   ANEMIA-NOS 07/07/2007   Anxiety    Arthritis    Chest pain, unspecified    Chronic kidney disease    hx of kidney stone   COLONIC POLYPS, ADENOMATOUS, HX OF    Complication of anesthesia    Dementia (HCC)    Disturbance of skin sensation    DIVERTICULOSIS, COLON 02/23/2009   Dyspnea    Family history of diabetes mellitus    Family history of malignant neoplasm of breast    FATIGUE 05/25/2009   Fever,  unspecified    Heart murmur    hx of   HYPERLIPIDEMIA 07/07/2007   HYPERPARATHYROIDISM UNSPECIFIED 10/20/2007   HYPERTENSION 02/04/2008   Incomplete right bundle branch block (RBBB) determined by electrocardiography 04/15/2023   Irritable bowel syndrome (IBS)    NEPHROLITHIASIS, HX OF 11/15/2008   OSTEOPOROSIS 01/01/2008   Other acquired absence of organ    parathyroidectomy   Pain in limb    PALPITATIONS, OCCASIONAL 07/12/2008   PARATHYROIDECTOMY 01/02/2009   PONV (postoperative nausea and vomiting)    SCOLIOSIS 05/25/2009   Seasonal allergies    Unspecified constipation    URINARY INCONTINENCE 07/07/2007   UTI'S, RECURRENT 11/20/2009   kimbrough     Past Surgical History:  Procedure Laterality Date   BUNIONECTOMY     CATARACT EXTRACTION, BILATERAL     COLONOSCOPY     EXTRACORPOREAL SHOCK WAVE LITHOTRIPSY Left 12/21/2020   Procedure: EXTRACORPOREAL SHOCK WAVE LITHOTRIPSY (ESWL);  Surgeon: Nieves Cough, MD;  Location: [facility];  Service: Urology;  Laterality: Left;   LAPAROSCOPIC APPENDECTOMY  01/19/2016   Procedure: APPENDECTOMY LAPAROSCOPIC with orifice of cecal polyp;  Surgeon: Bernarda Ned, MD;  Location: [facility];  Service: General;;   LEFT HEART CATH AND CORONARY ANGIOGRAPHY N/A 04/07/2017   Procedure: Left Heart Cath and  Coronary Angiography;  Surgeon: Mady Bruckner, MD;  Location: [facility];  Service: Cardiovascular;  Laterality: N/A;   OLECRANON BURSECTOMY Left 10/21/2023   Procedure: OLECRANON (ELBOW) BURSA;  Surgeon: Beverley Evalene BIRCH, MD;  Location: [facility];  Service: Orthopedics;  Laterality: Left;   ORIF ELBOW FRACTURE Left 10/21/2023   Procedure: Open Reduction Internal fixation (ORIF), fracture, elbow/olecranon;  Surgeon: Beverley Evalene BIRCH, MD;  Location: [facility];  Service: Orthopedics;  Laterality: Left;   PARATHYROIDECTOMY     Rt Superior open neck exploration   POLYPECTOMY     RIGHT OOPHORECTOMY     TONSILLECTOMY      Allergies   Allergen Reactions   Anesthetics, Amide Other (See Comments)    Pt states she had hallucinations and HTN after procedure 01/19/16-she feels may have been reaction to anesthetic   Lisinopril Cough    Outpatient Encounter Medications as of 11/24/2023  Medication Sig   acetaminophen  (TYLENOL ) 500 MG tablet Take 2 tablets (1,000 mg total) by mouth 2 (two) times daily.   acetaminophen  (TYLENOL ) 500 MG tablet Take 2 tablets (1,000 mg total) by mouth daily as needed.   amLODipine  (NORVASC ) 5 MG tablet Take 1 tablet (5 mg total) by mouth daily.   b complex vitamins capsule Take 1 capsule by mouth daily.   Cholecalciferol (VITAMIN D3) 125 MCG (5000 UT) CAPS Take 1 capsule (5,000 Units total) by mouth daily.   denosumab  (PROLIA ) 60 MG/ML SOSY injection Inject 60 mg into the skin every 6 (six) months.   olmesartan  (BENICAR ) 20 MG tablet Take 1 tablet (20 mg total) by mouth 2 (two) times daily.   ondansetron  (ZOFRAN -ODT) 4 MG disintegrating tablet Take 4 mg by mouth every 8 (eight) hours as needed for nausea or vomiting.   oxycodone  (OXY-IR) 5 MG capsule Take 2.5 mg by mouth every 4 (four) hours as needed for pain.   polyethylene glycol (MIRALAX  / GLYCOLAX ) 17 g packet Take 17 g by mouth daily as needed for mild constipation.   polyethylene glycol powder (GLYCOLAX /MIRALAX ) 17 GM/SCOOP powder Take 17 g by mouth daily.   senna (SENOKOT) 8.6 MG TABS tablet Take 2 tablets (17.2 mg total) by mouth at bedtime as needed for mild constipation.   traZODone  (DESYREL ) 50 MG tablet Take 25 mg by mouth at bedtime.   No facility-administered encounter medications on file as of 11/24/2023.    Review of Systems:  Review of Systems  Constitutional:  Negative for activity change and appetite change.  HENT: Negative.    Respiratory:  Negative for cough and shortness of breath.   Cardiovascular:  Negative for leg swelling.  Gastrointestinal:  Positive for constipation.  Genitourinary: Negative.   Musculoskeletal:   Positive for gait problem. Negative for arthralgias and myalgias.  Skin: Negative.   Neurological:  Positive for dizziness. Negative for weakness.  Psychiatric/Behavioral:  Positive for confusion. Negative for dysphoric mood and sleep disturbance.     Health Maintenance  Topic Date Due   COVID-19 Vaccine (7 - 2024-25 season) 07/20/2023   Medicare Annual Wellness (AWV)  03/16/2024   DTaP/Tdap/Td (2 - Td or Tdap) 01/05/2025   Pneumonia Vaccine 28+ Years old  Completed   INFLUENZA VACCINE  Completed   DEXA SCAN  Completed   Zoster Vaccines- Shingrix  Completed   HPV VACCINES  Aged Out   Colonoscopy  Discontinued    Physical Exam: Vitals:   11/24/23 1540  BP: 100/60  Pulse: 60  Resp: 14  Temp: 97.7 F (36.5 C)  There is no height or weight on file to calculate BMI. Physical Exam Vitals reviewed.  Constitutional:      Appearance: Normal appearance.  HENT:     Head: Normocephalic.     Nose: Nose normal.     Mouth/Throat:     Mouth: Mucous membranes are moist.     Pharynx: Oropharynx is clear.  Eyes:     Pupils: Pupils are equal, round, and reactive to light.  Cardiovascular:     Rate and Rhythm: Normal rate and regular rhythm.     Pulses: Normal pulses.     Heart sounds: Normal heart sounds. No murmur heard. Pulmonary:     Effort: Pulmonary effort is normal.     Breath sounds: Normal breath sounds.  Abdominal:     General: Abdomen is flat. Bowel sounds are normal.     Palpations: Abdomen is soft.  Musculoskeletal:        General: No swelling.     Cervical back: Neck supple.  Skin:    General: Skin is warm.  Neurological:     General: No focal deficit present.     Mental Status: She is alert and oriented to person, place, and time.  Psychiatric:        Mood and Affect: Mood normal.        Thought Content: Thought content normal.     Labs reviewed: Basic Metabolic Panel: Recent Labs    03/18/23 1544 10/21/23 0657  NA 142 138  K 4.1 3.9  CL 108 108   CO2 26 23  GLUCOSE 103* 95  BUN 18 19  CREATININE 0.81 0.66  CALCIUM  9.3 9.2  TSH 1.20  --    Liver Function Tests: Recent Labs    03/18/23 1544  AST 19  ALT 12  ALKPHOS 59  BILITOT 0.8  PROT 6.5  ALBUMIN 4.0   No results for input(s): LIPASE, AMYLASE in the last 8760 hours. No results for input(s): AMMONIA in the last 8760 hours. CBC: Recent Labs    10/21/23 0657  WBC 5.7  HGB 12.6  HCT 38.5  MCV 98.5  PLT 213   Lipid Panel: No results for input(s): CHOL, HDL, LDLCALC, TRIG, CHOLHDL, LDLDIRECT in the last 8760 hours. No results found for: HGBA1C  Procedures since last visit: No results found.  Assessment/Plan 1. Essential hypertension (Primary) Had been doing well with Benicar  and Norvasc  Today her BP dropped after being high  Will change timing of her Norvasc  to evening She has been doing well with combination of Benicar  and Norvasc  Per Dr Kattie Ativan  also helps with her BP Will also make sure she follows with Cardiology 2. Chronic idiopathic constipation Continue on Miralax  and Senna at night Seems ot be working 3. S/P ORIF (open reduction internal fixation) fracture Pain Controlled Plans to [place] back [place] place with [facility]    Labs/tests ordered:  * No order type specified * Next appt:  Visit date not found

## 2023-11-25 DIAGNOSIS — R2689 Other abnormalities of gait and mobility: Secondary | ICD-10-CM | POA: Diagnosis not present

## 2023-11-25 DIAGNOSIS — M6259 Muscle wasting and atrophy, not elsewhere classified, multiple sites: Secondary | ICD-10-CM | POA: Diagnosis not present

## 2023-11-25 DIAGNOSIS — M63812 Disorders of muscle in diseases classified elsewhere, left shoulder: Secondary | ICD-10-CM | POA: Diagnosis not present

## 2023-11-25 DIAGNOSIS — S52022D Displaced fracture of olecranon process without intraarticular extension of left ulna, subsequent encounter for closed fracture with routine healing: Secondary | ICD-10-CM | POA: Diagnosis not present

## 2023-11-25 DIAGNOSIS — M25512 Pain in left shoulder: Secondary | ICD-10-CM | POA: Diagnosis not present

## 2023-11-25 DIAGNOSIS — M25522 Pain in left elbow: Secondary | ICD-10-CM | POA: Diagnosis not present

## 2023-11-26 DIAGNOSIS — M25522 Pain in left elbow: Secondary | ICD-10-CM | POA: Diagnosis not present

## 2023-11-26 DIAGNOSIS — M6259 Muscle wasting and atrophy, not elsewhere classified, multiple sites: Secondary | ICD-10-CM | POA: Diagnosis not present

## 2023-11-26 DIAGNOSIS — M25512 Pain in left shoulder: Secondary | ICD-10-CM | POA: Diagnosis not present

## 2023-11-26 DIAGNOSIS — M63812 Disorders of muscle in diseases classified elsewhere, left shoulder: Secondary | ICD-10-CM | POA: Diagnosis not present

## 2023-11-26 DIAGNOSIS — S52022D Displaced fracture of olecranon process without intraarticular extension of left ulna, subsequent encounter for closed fracture with routine healing: Secondary | ICD-10-CM | POA: Diagnosis not present

## 2023-11-26 DIAGNOSIS — R2689 Other abnormalities of gait and mobility: Secondary | ICD-10-CM | POA: Diagnosis not present

## 2023-11-27 DIAGNOSIS — M25512 Pain in left shoulder: Secondary | ICD-10-CM | POA: Diagnosis not present

## 2023-11-27 DIAGNOSIS — M25522 Pain in left elbow: Secondary | ICD-10-CM | POA: Diagnosis not present

## 2023-11-27 DIAGNOSIS — S52022D Displaced fracture of olecranon process without intraarticular extension of left ulna, subsequent encounter for closed fracture with routine healing: Secondary | ICD-10-CM | POA: Diagnosis not present

## 2023-11-27 DIAGNOSIS — R2689 Other abnormalities of gait and mobility: Secondary | ICD-10-CM | POA: Diagnosis not present

## 2023-11-27 DIAGNOSIS — M63812 Disorders of muscle in diseases classified elsewhere, left shoulder: Secondary | ICD-10-CM | POA: Diagnosis not present

## 2023-11-27 DIAGNOSIS — M6259 Muscle wasting and atrophy, not elsewhere classified, multiple sites: Secondary | ICD-10-CM | POA: Diagnosis not present

## 2023-11-28 DIAGNOSIS — M6259 Muscle wasting and atrophy, not elsewhere classified, multiple sites: Secondary | ICD-10-CM | POA: Diagnosis not present

## 2023-11-28 DIAGNOSIS — S52022D Displaced fracture of olecranon process without intraarticular extension of left ulna, subsequent encounter for closed fracture with routine healing: Secondary | ICD-10-CM | POA: Diagnosis not present

## 2023-11-28 DIAGNOSIS — M25512 Pain in left shoulder: Secondary | ICD-10-CM | POA: Diagnosis not present

## 2023-11-28 DIAGNOSIS — M63812 Disorders of muscle in diseases classified elsewhere, left shoulder: Secondary | ICD-10-CM | POA: Diagnosis not present

## 2023-11-28 DIAGNOSIS — R2689 Other abnormalities of gait and mobility: Secondary | ICD-10-CM | POA: Diagnosis not present

## 2023-11-28 DIAGNOSIS — M25522 Pain in left elbow: Secondary | ICD-10-CM | POA: Diagnosis not present

## 2023-12-01 DIAGNOSIS — M25522 Pain in left elbow: Secondary | ICD-10-CM | POA: Diagnosis not present

## 2023-12-01 DIAGNOSIS — R2689 Other abnormalities of gait and mobility: Secondary | ICD-10-CM | POA: Diagnosis not present

## 2023-12-01 DIAGNOSIS — M6259 Muscle wasting and atrophy, not elsewhere classified, multiple sites: Secondary | ICD-10-CM | POA: Diagnosis not present

## 2023-12-01 DIAGNOSIS — M63812 Disorders of muscle in diseases classified elsewhere, left shoulder: Secondary | ICD-10-CM | POA: Diagnosis not present

## 2023-12-01 DIAGNOSIS — S52022D Displaced fracture of olecranon process without intraarticular extension of left ulna, subsequent encounter for closed fracture with routine healing: Secondary | ICD-10-CM | POA: Diagnosis not present

## 2023-12-01 DIAGNOSIS — M25512 Pain in left shoulder: Secondary | ICD-10-CM | POA: Diagnosis not present

## 2023-12-02 DIAGNOSIS — M63812 Disorders of muscle in diseases classified elsewhere, left shoulder: Secondary | ICD-10-CM | POA: Diagnosis not present

## 2023-12-02 DIAGNOSIS — R2689 Other abnormalities of gait and mobility: Secondary | ICD-10-CM | POA: Diagnosis not present

## 2023-12-02 DIAGNOSIS — M25512 Pain in left shoulder: Secondary | ICD-10-CM | POA: Diagnosis not present

## 2023-12-02 DIAGNOSIS — M25522 Pain in left elbow: Secondary | ICD-10-CM | POA: Diagnosis not present

## 2023-12-02 DIAGNOSIS — S52022D Displaced fracture of olecranon process without intraarticular extension of left ulna, subsequent encounter for closed fracture with routine healing: Secondary | ICD-10-CM | POA: Diagnosis not present

## 2023-12-02 DIAGNOSIS — M6259 Muscle wasting and atrophy, not elsewhere classified, multiple sites: Secondary | ICD-10-CM | POA: Diagnosis not present

## 2023-12-03 ENCOUNTER — Ambulatory Visit: Payer: Medicare Other | Admitting: Internal Medicine

## 2023-12-03 DIAGNOSIS — M25522 Pain in left elbow: Secondary | ICD-10-CM | POA: Diagnosis not present

## 2023-12-03 DIAGNOSIS — M63812 Disorders of muscle in diseases classified elsewhere, left shoulder: Secondary | ICD-10-CM | POA: Diagnosis not present

## 2023-12-03 DIAGNOSIS — M25512 Pain in left shoulder: Secondary | ICD-10-CM | POA: Diagnosis not present

## 2023-12-03 DIAGNOSIS — M6259 Muscle wasting and atrophy, not elsewhere classified, multiple sites: Secondary | ICD-10-CM | POA: Diagnosis not present

## 2023-12-03 DIAGNOSIS — R2689 Other abnormalities of gait and mobility: Secondary | ICD-10-CM | POA: Diagnosis not present

## 2023-12-03 DIAGNOSIS — S52022D Displaced fracture of olecranon process without intraarticular extension of left ulna, subsequent encounter for closed fracture with routine healing: Secondary | ICD-10-CM | POA: Diagnosis not present

## 2023-12-04 DIAGNOSIS — M25512 Pain in left shoulder: Secondary | ICD-10-CM | POA: Diagnosis not present

## 2023-12-04 DIAGNOSIS — M25522 Pain in left elbow: Secondary | ICD-10-CM | POA: Diagnosis not present

## 2023-12-04 DIAGNOSIS — R2689 Other abnormalities of gait and mobility: Secondary | ICD-10-CM | POA: Diagnosis not present

## 2023-12-04 DIAGNOSIS — M63812 Disorders of muscle in diseases classified elsewhere, left shoulder: Secondary | ICD-10-CM | POA: Diagnosis not present

## 2023-12-04 DIAGNOSIS — M6259 Muscle wasting and atrophy, not elsewhere classified, multiple sites: Secondary | ICD-10-CM | POA: Diagnosis not present

## 2023-12-04 DIAGNOSIS — S52022D Displaced fracture of olecranon process without intraarticular extension of left ulna, subsequent encounter for closed fracture with routine healing: Secondary | ICD-10-CM | POA: Diagnosis not present

## 2023-12-05 DIAGNOSIS — M25512 Pain in left shoulder: Secondary | ICD-10-CM | POA: Diagnosis not present

## 2023-12-05 DIAGNOSIS — R2689 Other abnormalities of gait and mobility: Secondary | ICD-10-CM | POA: Diagnosis not present

## 2023-12-05 DIAGNOSIS — M63812 Disorders of muscle in diseases classified elsewhere, left shoulder: Secondary | ICD-10-CM | POA: Diagnosis not present

## 2023-12-05 DIAGNOSIS — S52022D Displaced fracture of olecranon process without intraarticular extension of left ulna, subsequent encounter for closed fracture with routine healing: Secondary | ICD-10-CM | POA: Diagnosis not present

## 2023-12-05 DIAGNOSIS — M25522 Pain in left elbow: Secondary | ICD-10-CM | POA: Diagnosis not present

## 2023-12-05 DIAGNOSIS — M6259 Muscle wasting and atrophy, not elsewhere classified, multiple sites: Secondary | ICD-10-CM | POA: Diagnosis not present

## 2023-12-08 DIAGNOSIS — M6259 Muscle wasting and atrophy, not elsewhere classified, multiple sites: Secondary | ICD-10-CM | POA: Diagnosis not present

## 2023-12-08 DIAGNOSIS — M25522 Pain in left elbow: Secondary | ICD-10-CM | POA: Diagnosis not present

## 2023-12-08 DIAGNOSIS — M63812 Disorders of muscle in diseases classified elsewhere, left shoulder: Secondary | ICD-10-CM | POA: Diagnosis not present

## 2023-12-08 DIAGNOSIS — R2689 Other abnormalities of gait and mobility: Secondary | ICD-10-CM | POA: Diagnosis not present

## 2023-12-08 DIAGNOSIS — S52022D Displaced fracture of olecranon process without intraarticular extension of left ulna, subsequent encounter for closed fracture with routine healing: Secondary | ICD-10-CM | POA: Diagnosis not present

## 2023-12-08 DIAGNOSIS — M25512 Pain in left shoulder: Secondary | ICD-10-CM | POA: Diagnosis not present

## 2023-12-09 ENCOUNTER — Encounter: Payer: Self-pay | Admitting: Orthopedic Surgery

## 2023-12-09 ENCOUNTER — Encounter (HOSPITAL_BASED_OUTPATIENT_CLINIC_OR_DEPARTMENT_OTHER): Payer: Self-pay | Admitting: Family

## 2023-12-09 ENCOUNTER — Non-Acute Institutional Stay (SKILLED_NURSING_FACILITY): Payer: Self-pay | Admitting: Orthopedic Surgery

## 2023-12-09 ENCOUNTER — Ambulatory Visit (HOSPITAL_BASED_OUTPATIENT_CLINIC_OR_DEPARTMENT_OTHER): Payer: Medicare Other | Admitting: Family

## 2023-12-09 VITALS — BP 134/66 | HR 63 | Ht 60.0 in | Wt 116.8 lb

## 2023-12-09 DIAGNOSIS — K5904 Chronic idiopathic constipation: Secondary | ICD-10-CM | POA: Diagnosis not present

## 2023-12-09 DIAGNOSIS — Z8781 Personal history of (healed) traumatic fracture: Secondary | ICD-10-CM | POA: Diagnosis not present

## 2023-12-09 DIAGNOSIS — M25522 Pain in left elbow: Secondary | ICD-10-CM | POA: Diagnosis not present

## 2023-12-09 DIAGNOSIS — M63812 Disorders of muscle in diseases classified elsewhere, left shoulder: Secondary | ICD-10-CM | POA: Diagnosis not present

## 2023-12-09 DIAGNOSIS — R2689 Other abnormalities of gait and mobility: Secondary | ICD-10-CM | POA: Diagnosis not present

## 2023-12-09 DIAGNOSIS — S52022D Displaced fracture of olecranon process without intraarticular extension of left ulna, subsequent encounter for closed fracture with routine healing: Secondary | ICD-10-CM | POA: Diagnosis not present

## 2023-12-09 DIAGNOSIS — I10 Essential (primary) hypertension: Secondary | ICD-10-CM | POA: Diagnosis not present

## 2023-12-09 DIAGNOSIS — R0989 Other specified symptoms and signs involving the circulatory and respiratory systems: Secondary | ICD-10-CM | POA: Diagnosis not present

## 2023-12-09 DIAGNOSIS — Z9889 Other specified postprocedural states: Secondary | ICD-10-CM | POA: Diagnosis not present

## 2023-12-09 DIAGNOSIS — M810 Age-related osteoporosis without current pathological fracture: Secondary | ICD-10-CM

## 2023-12-09 DIAGNOSIS — M25512 Pain in left shoulder: Secondary | ICD-10-CM | POA: Diagnosis not present

## 2023-12-09 DIAGNOSIS — M6259 Muscle wasting and atrophy, not elsewhere classified, multiple sites: Secondary | ICD-10-CM | POA: Diagnosis not present

## 2023-12-09 NOTE — Progress Notes (Signed)
Location:  [facility] Room Number: 159/A Place of Service:  SNF (31) Provider:  Octavia Heir, NP   Plotnikov, Georgina Quint, MD  Patient Care Team: Tresa Garter, MD as PCP - General (Internal Medicine) Jodelle Red, MD as PCP - Cardiology (Cardiology) [place], Kalissa Grays, MD (Dermatology) [place], [place] Lowella Bandy., MD (Urology) Romie Levee, MD as Consulting Physician (General Surgery) Napoleon Form, MD as Consulting Physician (Gastroenterology) Melina Fiddler, MD as Consulting Physician (Sports Medicine)  Extended Emergency Contact Information Primary Emergency Contact: Kahl,Nick Mobile Phone: [phone] Relation: Son  Code Status:  DNR Goals of care: Advanced Directive information    11/14/2023    9:21 AM  Advanced Directives  Does Patient Have a Medical Advance Directive? Yes  Type of Advance Directive Living will;Out of facility DNR (pink MOST or yellow form)  Does patient want to make changes to medical advance directive? No - Patient declined     Chief Complaint  Patient presents with   Discharge Note    HPI:  Pt is a 83 y.o. female seen today for discharge evaluation.   She currently resides on the rehabilitation unit at [facility]. PMH: HTN, HLD, osteoporosis, diverticulosis, CKD, chronic constipation and unstable gait.   11/21 mechanical fall. ED visit revealed left avulsion fracture of olecranon with mild displacement. She was admitted to [facility] rehabilitation unit after injury. 12/03 she underwent ORIF left elbow/olecranon. She tolerated procedure well and continued rehab stay after surgery. Left arm NWB. Pain controlled with tylenol. 12/10 she was noted to have elevated blood pressures. Amlodipine was increased to 5 mg daily. Blood pressures improved with medication change. F/u with cardiology 01/21. She continued to have issues with constipation. She was given scheduled miralax and senna.   [place]. No health concerns. No recent falls or injuries. PT/OT services will follow her outpatient. She is scheduled to follow up with PCP in 2 weeks. Appetite fair. Weight stable. Afebrile. Vitals stable.     Past Medical History:  Diagnosis Date   Abdominal pain, unspecified site    ADVERSE REACTION TO MEDICATION 07/31/2009   ANEMIA-NOS 07/07/2007   Anxiety    Arthritis    Chest pain, unspecified    Chronic kidney disease    hx of kidney stone   COLONIC POLYPS, ADENOMATOUS, HX OF    Complication of anesthesia    Dementia (HCC)    Disturbance of skin sensation    DIVERTICULOSIS, COLON 02/23/2009   Dyspnea    Family history of diabetes mellitus    Family history of malignant neoplasm of breast    FATIGUE 05/25/2009   Fever, unspecified    Heart murmur    hx of   HYPERLIPIDEMIA 07/07/2007   HYPERPARATHYROIDISM UNSPECIFIED 10/20/2007   HYPERTENSION 02/04/2008   Incomplete right bundle branch block (RBBB) determined by electrocardiography 04/15/2023   Irritable bowel syndrome (IBS)    NEPHROLITHIASIS, HX OF 11/15/2008   OSTEOPOROSIS 01/01/2008   Other acquired absence of organ    parathyroidectomy   Pain in limb    PALPITATIONS, OCCASIONAL 07/12/2008   PARATHYROIDECTOMY 01/02/2009   PONV (postoperative nausea and vomiting)    SCOLIOSIS 05/25/2009   Seasonal allergies    Unspecified constipation    URINARY INCONTINENCE 07/07/2007   UTI'S, RECURRENT 11/20/2009   kimbrough    Past Surgical History:  Procedure Laterality Date   BUNIONECTOMY     CATARACT EXTRACTION, BILATERAL     COLONOSCOPY  EXTRACORPOREAL SHOCK WAVE LITHOTRIPSY Left 12/21/2020   Procedure: EXTRACORPOREAL SHOCK WAVE LITHOTRIPSY (ESWL);  Surgeon: Jerilee Field, MD;  Location: [facility];  Service: Urology;  Laterality: Left;   LAPAROSCOPIC APPENDECTOMY  01/19/2016   Procedure: APPENDECTOMY LAPAROSCOPIC with orifice of cecal polyp;  Surgeon: Romie Levee, MD;   Location: [facility];  Service: General;;   LEFT HEART CATH AND CORONARY ANGIOGRAPHY N/A 04/07/2017   Procedure: Left Heart Cath and Coronary Angiography;  Surgeon: [name], MD;  Location: [facility] INVASIVE CV [facility];  Service: Cardiovascular;  Laterality: N/A;   OLECRANON BURSECTOMY Left 10/21/2023   Procedure: OLECRANON (ELBOW) BURSA;  Surgeon: Sheral Apley, MD;  Location: [facility];  Service: Orthopedics;  Laterality: Left;   ORIF ELBOW FRACTURE Left 10/21/2023   Procedure: Open Reduction Internal fixation (ORIF), fracture, elbow/olecranon;  Surgeon: Sheral Apley, MD;  Location: [facility];  Service: Orthopedics;  Laterality: Left;   PARATHYROIDECTOMY     Rt Superior open neck exploration   POLYPECTOMY     RIGHT OOPHORECTOMY     TONSILLECTOMY      Allergies  Allergen Reactions   Anesthetics, Amide Other (See Comments)    Pt states she had hallucinations and HTN after procedure 01/19/16-she feels may have been reaction to anesthetic   Lisinopril Cough    Outpatient Encounter Medications as of 12/09/2023  Medication Sig   acetaminophen (TYLENOL) 500 MG tablet Take 2 tablets (1,000 mg total) by mouth 2 (two) times daily.   acetaminophen (TYLENOL) 500 MG tablet Take 2 tablets (1,000 mg total) by mouth daily as needed.   amLODipine (NORVASC) 5 MG tablet Take 1 tablet (5 mg total) by mouth daily. (Patient taking differently: Take 5 mg by mouth every evening.)   b complex vitamins capsule Take 1 capsule by mouth daily.   Cholecalciferol (VITAMIN D3) 125 MCG (5000 UT) CAPS Take 1 capsule (5,000 Units total) by mouth daily.   denosumab (PROLIA) 60 MG/ML SOSY injection Inject 60 mg into the skin every 6 (six) months.   LORazepam (ATIVAN) 0.5 MG tablet Take 1 tablet (0.5 mg total) by mouth 2 (two) times daily as needed for anxiety.   olmesartan (BENICAR) 20 MG tablet Take 1 tablet (20 mg total) by mouth 2 (two) times daily.   ondansetron (ZOFRAN-ODT) 4 MG disintegrating tablet Take 4 mg by mouth  every 8 (eight) hours as needed for nausea or vomiting.   oxycodone (OXY-IR) 5 MG capsule Take 2.5 mg by mouth every 4 (four) hours as needed for pain.   polyethylene glycol (MIRALAX / GLYCOLAX) 17 g packet Take 17 g by mouth daily as needed for mild constipation.   polyethylene glycol powder (GLYCOLAX/MIRALAX) 17 GM/SCOOP powder Take 17 g by mouth daily.   senna (SENOKOT) 8.6 MG TABS tablet Take 2 tablets (17.2 mg total) by mouth at bedtime as needed for mild constipation. (Patient taking differently: Take 2 tablets by mouth at bedtime.)   traZODone (DESYREL) 50 MG tablet Take 25 mg by mouth at bedtime.   No facility-administered encounter medications on file as of 12/09/2023.    Review of Systems  Constitutional:  Negative for activity change and appetite change.  HENT:  Negative for sore throat and trouble swallowing.   Eyes:  Negative for visual disturbance.  Respiratory:  Negative for cough, shortness of breath and wheezing.   Cardiovascular:  Negative for chest pain and leg swelling.  Gastrointestinal:  Positive for constipation. Negative for abdominal distention, abdominal pain, nausea and vomiting.  Genitourinary:  Negative for dysuria and hematuria.  Musculoskeletal:  Positive for arthralgias and gait problem.  Skin:  Negative for wound.  Neurological:  Positive for weakness. Negative for dizziness and headaches.  Psychiatric/Behavioral:  Negative for confusion, dysphoric mood and sleep disturbance. The patient is not nervous/anxious.     Immunization History  Administered Date(s) Administered   Fluad Trivalent(High Dose 65+) 08/04/2023   Influenza Split 09/17/2011, 11/08/2015, 09/12/2017, 08/02/2020   Influenza Whole 08/19/2008   Influenza, High Dose Seasonal PF 08/20/2016, 09/12/2017, 08/21/2018, 09/28/2019, 10/12/2021, 09/01/2022   Influenza-Unspecified 09/18/2013, 08/18/2014, 11/08/2015, 09/15/2020   Moderna SARS-COV2 Booster Vaccination 07/03/2021   Moderna Sars-Covid-2  Vaccination 11/30/2019, 12/31/2019, 10/26/2020, 07/03/2021, 04/22/2022   Pneumococcal Conjugate-13 12/29/2013   Pneumococcal Polysaccharide-23 11/08/2015   Tdap 01/05/2015   Zoster Recombinant(Shingrix) 06/20/2021, 09/05/2021   Pertinent  Health Maintenance Due  Topic Date Due   INFLUENZA VACCINE  Completed   DEXA SCAN  Completed   Colonoscopy  Discontinued      03/18/2023    2:49 PM 03/31/2023   11:10 AM 04/23/2023   11:26 AM 08/04/2023    3:04 PM 10/14/2023    1:46 PM  Fall Risk  Falls in the past year? 1 0 0 0 1  Was there an injury with Fall? 1 0 0 0 1  Fall Risk Category Calculator 2 0 0 0 2  Patient at Risk for Falls Due to History of fall(s) No Fall Risks No Fall Risks No Fall Risks History of fall(s);Impaired balance/gait  Fall risk Follow up Falls evaluation completed Falls evaluation completed Falls evaluation completed Falls evaluation completed Falls evaluation completed;Education provided;Falls prevention discussed   Functional Status Survey:    Vitals:   12/09/23 1321  BP: (!) 142/68  Pulse: 68  Resp: 12  Temp: 98.5 F (36.9 C)  SpO2: 96%  Weight: 113 lb 9.6 oz (51.5 kg)  Height: 4\' 10"  (1.473 m)   Body mass index is 23.74 kg/m. Physical Exam Vitals reviewed.  Constitutional:      General: She is not in acute distress. HENT:     Head: Normocephalic.  Eyes:     General:        Right eye: No discharge.        Left eye: No discharge.  Cardiovascular:     Rate and Rhythm: Normal rate and regular rhythm.     Pulses: Normal pulses.     Heart sounds: Normal heart sounds.  Pulmonary:     Effort: Pulmonary effort is normal.     Breath sounds: Normal breath sounds.  Abdominal:     General: Bowel sounds are normal. There is no distension.     Palpations: Abdomen is soft.     Tenderness: There is no abdominal tenderness.  Musculoskeletal:     Cervical back: Neck supple.     Comments: LUE with limited ROM  Skin:    General: Skin is warm.     Capillary  Refill: Capillary refill takes less than 2 seconds.     Comments: Left elbow surgical dressing closed  Neurological:     General: No focal deficit present.     Mental Status: She is alert and oriented to person, place, and time.  Psychiatric:        Mood and Affect: Mood normal.     Labs reviewed: Recent Labs    03/18/23 1544 10/21/23 0657  NA 142 138  K 4.1 3.9  CL 108 108  CO2 26 23  GLUCOSE 103* 95  BUN  18 19  CREATININE 0.81 0.66  CALCIUM 9.3 9.2   Recent Labs    03/18/23 1544  AST 19  ALT 12  ALKPHOS 59  BILITOT 0.8  PROT 6.5  ALBUMIN 4.0   Recent Labs    10/21/23 0657  WBC 5.7  HGB 12.6  HCT 38.5  MCV 98.5  PLT 213   Lab Results  Component Value Date   TSH 1.20 03/18/2023   No results found for: "HGBA1C" Lab Results  Component Value Date   CHOL 132 01/25/2015   HDL 68 01/25/2015   LDLCALC 51 01/25/2015   LDLDIRECT 156.3 03/28/2011   TRIG 64 01/25/2015   CHOLHDL 1.9 01/25/2015    Significant Diagnostic Results in last 30 days:  No results found.  Assessment/Plan 1. S/P ORIF (open reduction internal fixation) fracture (Primary) - 11/21 mechanical fall - 12/03 ORIF left elbow/olecranon> Dr. Eulah Pont - initially LUE NBW - off sling - limited ROM to LUE - cont tylenol prn - cont PT/OT outpatient  2. Essential hypertension - controlled with Benicar and amlodipine  3. Chronic idiopathic constipation - abdomen soft - cont miralax and senna  4. Age-related osteoporosis without current pathological fracture - DEXA 2012, t score -3.3 and -2.8 - continue vitamin D and Prolia    Family/ staff Communication: plan discussed with patient and nurse  Labs/tests ordered:  none

## 2023-12-09 NOTE — Progress Notes (Signed)
Cardiology Office Note:  .   Date:  12/09/2023  ID:  [name], DOB 1941/05/27, MRN [id number] PCP: Teresa Garter, MD  [facility] Providers Cardiologist:  Jodelle Red, MD    History of Present Illness: [name]   [name] is a 83 y.o. female  hx of HTN, CAD (nonobstructive by cath 2018), HLD, PAF during hospitalization 2017   Prior patient of Dr. Okey Stein.  Established with Dr. Cristal Stein 07/19/2019.  Longstanding history of labile blood pressure and orthostatic hypotension. Losartan previously transitioned to Olmesartan 20mg . She has had persistent GI upset after her first meal despite adjustment in medication timings and no symptoms after later meals. 24h BP cuff 06/03/23 with average BP 152/59 (daytime 155/71 and nighttime 137/60) with range 73/43-213/95. At most recent visit 09/26/23 with Dr. Cristal Stein Amlodipine 2.5mg  was added.   ED visit 10/09/23 with mechanical fall after her [employer] Pick[employer] got caught in the door.  Left elbow had olecranon fracture.  On 11/10/2023 underwent ORIF of left elbow/olecranon.  Presents today for follow-up independently. Pleasant lady who resides at [facility]. Just moved from SNF back to her ILF apartment yesterday and got her wrist splint off yesterday. Planning to start PT/OT. Her BP was monitored routinely while she was at [facility] with readings ranging since December 21st from 93/62-167/65. Her average BP over last 3 days was 132/66. Her personal SBP goal is 145-150 as is very symptomatic with hypotensive readings.   ROS: Please see the history of present illness.    All other systems reviewed and are negative.   Studies Reviewed: .         Cardiac Studies & Procedures   CARDIAC CATHETERIZATION  CARDIAC CATHETERIZATION 04/07/2017  Narrative Conclusions: 1. Minimal coronary artery disease involving the large epicardial coronary arteries. Mild to moderate stenosis of diagonal branches is evident. 2. Normal left ventricular  contraction. 3. Normal left ventricular systolic function. 4. Small right radial artery.  Recommendations: 1. Continue medical therapy and risk factor modification.  Teresa Kendall, MD [facility] Pager: [phone]  Findings Coronary Findings Diagnostic  Dominance: Right  Left Main Vessel is large. Vessel is angiographically normal.  Left Anterior Descending Vessel is large. The vessel exhibits minimal luminal irregularities.  First Diagonal Branch Vessel is moderate in size.  Second Diagonal Branch Vessel is small in size.  Third Diagonal Branch Vessel is small in size.  Left Circumflex Vessel is large. Vessel is angiographically normal.  First Obtuse Marginal Branch Vessel is small in size.  Second Obtuse Marginal Branch Vessel is moderate in size.  Third Obtuse Marginal Branch Vessel is moderate in size.  Right Coronary Artery Vessel is large. Vessel is angiographically normal.  Right Posterior Descending Artery Vessel is moderate in size.  Right Posterior Atrioventricular Artery Vessel is moderate in size.  Intervention  No interventions have been documented.   STRESS TESTS  MYOCARDIAL PERFUSION IMAGING 03/24/2017  Narrative  Nuclear stress EF: 67%.  There was no ST segment deviation noted during stress.  There is a small defect of mild severity present in the basal inferoseptal and mid inferoseptal location. The defect is partially reversible and could represent variations in diaphragmatic attenuation vs. ischemia.  There is a small defect of moderate severity present in the apex location. The defect is reversible and partially reversible.  There is a medium defect of moderate severity present in the basal anteroseptal, mid anteroseptal and apical septal location. The defect is reversible and worrisome for ischemia.  The left ventricular  ejection fraction is hyperdynamic (>65%).  This is a high risk study.   ECHOCARDIOGRAM  ECHOCARDIOGRAM COMPLETE 04/17/2021  Narrative ECHOCARDIOGRAM REPORT    Patient Name:   [name] Date of Exam: 04/17/2021 Medical Rec #:  [id number]      Height:       59.0 in Accession #:    [id number]     Weight:       111.0 lb Date of Birth:  June 20, 1941      BSA:          1.436 m Patient Age:    80 years       BP:           144/86 mmHg Patient Gender: F              HR:           62 bpm. Exam Location:  [address]  Procedure: 2D Echo, Cardiac Doppler and Color Doppler  Indications:    I10 Hypertension  History:        Patient has prior history of Echocardiogram examinations, most recent 01/22/2016. Risk Factors:Hypertension and Dyslipidemia. Chronic kidney disease. Paroxysmal atrial fibrillation. Fatigue. Scoliosis. Murmur. Chest pain.  Sonographer:    Daphine Deutscher RDCS Referring Phys: [id number] BRIDGETTE CHRISTOPHER  IMPRESSIONS   1. Left ventricular ejection fraction, by estimation, is 60 to 65%. The left ventricle has normal function. The left ventricle has no regional wall motion abnormalities. There is severe asymmetric left ventricular hypertrophy of the basal-septal segment. Left ventricular diastolic parameters are consistent with Grade I diastolic dysfunction (impaired relaxation). 2. Right ventricular systolic function is normal. The right ventricular size is normal. There is normal pulmonary artery systolic pressure. The estimated right ventricular systolic pressure is 32.4 mmHg. 3. Left atrial size was mildly dilated. 4. The mitral valve is abnormal. Trivial mitral valve regurgitation. 5. The aortic valve is tricuspid. Aortic valve regurgitation is mild. Mild to moderate aortic valve sclerosis/calcification is present, without any evidence of aortic stenosis. Aortic regurgitation PHT measures 517 msec. 6. The inferior vena cava is dilated in size with >50% respiratory variability, suggesting right atrial pressure of 8  mmHg.  Comparison(s): Changes from prior study are noted. 01/22/2016: LVEF 55-60%, mild LAE, RVSP 49 mmHg.  FINDINGS Left Ventricle: Left ventricular ejection fraction, by estimation, is 60 to 65%. The left ventricle has normal function. The left ventricle has no regional wall motion abnormalities. The left ventricular internal cavity size was normal in size. There is severe asymmetric left ventricular hypertrophy of the basal-septal segment. Left ventricular diastolic parameters are consistent with Grade I diastolic dysfunction (impaired relaxation). Indeterminate filling pressures.  Right Ventricle: The right ventricular size is normal. No increase in right ventricular wall thickness. Right ventricular systolic function is normal. There is normal pulmonary artery systolic pressure. The tricuspid regurgitant velocity is 2.47 m/s, and with an assumed right atrial pressure of 8 mmHg, the estimated right ventricular systolic pressure is 32.4 mmHg.  Left Atrium: Left atrial size was mildly dilated.  Right Atrium: Right atrial size was normal in size.  Pericardium: There is no evidence of pericardial effusion.  Mitral Valve: The mitral valve is abnormal. There is mild thickening of the mitral valve leaflet(s). Trivial mitral valve regurgitation.  Tricuspid Valve: The tricuspid valve is grossly normal. Tricuspid valve regurgitation is mild.  Aortic Valve: The aortic valve is tricuspid. Aortic valve regurgitation is mild. Aortic regurgitation PHT measures 517 msec. Mild to moderate aortic valve sclerosis/calcification is present, without  any evidence of aortic stenosis.  Pulmonic Valve: The pulmonic valve was grossly normal. Pulmonic valve regurgitation is trivial.  Aorta: The aortic root and ascending aorta are structurally normal, with no evidence of dilitation.  Venous: The inferior vena cava is dilated in size with greater than 50% respiratory variability, suggesting right atrial pressure of 8  mmHg.  IAS/Shunts: No atrial level shunt detected by color flow Doppler.   LEFT VENTRICLE PLAX 2D LVIDd:         2.90 cm  Diastology LVIDs:         1.90 cm  LV e' medial:    4.57 cm/s LV PW:         1.30 cm  LV E/e' medial:  14.8 LV IVS:        1.60 cm  LV e' lateral:   6.74 cm/s LVOT diam:     2.10 cm  LV E/e' lateral: 10.0 LV SV:         78 LV SV Index:   55 LVOT Area:     3.46 cm   RIGHT VENTRICLE             IVC RV Basal diam:  3.60 cm     IVC diam: 2.10 cm RV S prime:     16.00 cm/s TAPSE (M-mode): 2.0 cm  LEFT ATRIUM             Index       RIGHT ATRIUM           Index LA diam:        3.70 cm 2.58 cm/m  RA Area:     11.40 cm LA Vol (A2C):   38.4 ml 26.74 ml/m RA Volume:   27.30 ml  19.01 ml/m LA Vol (A4C):   31.1 ml 21.66 ml/m LA Biplane Vol: 36.3 ml 25.28 ml/m AORTIC VALVE LVOT Vmax:   108.50 cm/s LVOT Vmean:  66.400 cm/s LVOT VTI:    0.226 m AI PHT:      517 msec  AORTA Ao Root diam: 3.50 cm Ao Asc diam:  3.90 cm  MITRAL VALVE               TRICUSPID VALVE MV Area (PHT): 3.03 cm    TR Peak grad:   24.4 mmHg MV Decel Time: 250 msec    TR Vmax:        247.00 cm/s MV E velocity: 67.70 cm/s MV A velocity: 67.70 cm/s  SHUNTS MV E/A ratio:  1.00        Systemic VTI:  0.23 m Systemic Diam: 2.10 cm  Zoila Shutter MD Electronically signed by Zoila Shutter MD Signature Date/Time: 04/17/2021/1:55:32 PM    Final   MONITORS  CARDIAC EVENT MONITOR 01/23/2016  Narrative NSR with PACs.  No atrial fibrillation.           Risk Assessment/Calculations:             Physical Exam:   VS:  BP 134/66   Pulse 63   Ht 5' (1.524 m)   Wt 116 lb 12.8 oz (53 kg)   SpO2 97%   BMI 22.81 kg/m    Wt Readings from Last 3 Encounters:  12/09/23 116 lb 12.8 oz (53 kg)  12/09/23 113 lb 9.6 oz (51.5 kg)  11/14/23 111 lb 3.2 oz (50.4 kg)    GEN: thin, well developed in no acute distress. Ambulates with rolling Koraline Phillipson NECK: No JVD; No carotid bruits CARDIAC: RRR, no  murmurs, rubs, gallops RESPIRATORY:  Clear to auscultation without rales, wheezing or rhonchi  ABDOMEN: Soft, non-tender, non-distended EXTREMITIES:  Nonpitting pretibial edema; No deformity   ASSESSMENT AND PLAN: .    Labile hypertension - Longstanding issue. Low suspicion her feeling poorly in the morning is related to her BP medications as unchanged despite adjusting time. BP overall controlled by SNF readings. Reasonable BP goal <140/90 given her labile BP and symptoms with relative hypotension. Cotninue Olmesartan 20mg  BID, Amlodipine 5mg  daily.  Heart healthy diet and regular cardiovascular exercise encouraged.  Encouraged to participate in PT/OT  Anxiety - She feels this is helping with her stress and to stabilize BP. Previously encouraged to discuss starting a longer-acting medication to help with mood such as an SSRI.      Dispo: follow up in 3-4 mos  Signed, Alver Sorrow, NP

## 2023-12-09 NOTE — Patient Instructions (Signed)
Medication Instructions:  Continue your current medications.   *If you need a refill on your cardiac medications before your next appointment, please call your pharmacy*  Follow-Up: At [facility], you and your health needs are our priority.  As part of our continuing mission to provide you with exceptional heart care, we have created designated Provider Care Teams.  These Care Teams include your primary Cardiologist (physician) and Advanced Practice Providers (APPs -  Physician Assistants and Nurse Practitioners) who all work together to provide you with the care you need, when you need it.  We recommend signing up for the patient portal called "MyChart".  Sign up information is provided on this After Visit Summary.  MyChart is used to connect with patients for Virtual Visits (Telemedicine).  Patients are able to view lab/test results, encounter notes, upcoming appointments, etc.  Non-urgent messages can be sent to your provider as well.   To learn more about what you can do with MyChart, go to [email or web address].    Your next appointment:   3-4 month(s)  Provider:   Jodelle Red, MD or Alver Sorrow, NP    Other Instructions  If your blood pressure is consistently more than 140/90 or less than 112/60, call and let us know  Be sure to check your blood pressure when you are sitting still and resting

## 2023-12-11 DIAGNOSIS — R531 Weakness: Secondary | ICD-10-CM | POA: Diagnosis not present

## 2023-12-11 DIAGNOSIS — M6259 Muscle wasting and atrophy, not elsewhere classified, multiple sites: Secondary | ICD-10-CM | POA: Diagnosis not present

## 2023-12-11 DIAGNOSIS — S52022D Displaced fracture of olecranon process without intraarticular extension of left ulna, subsequent encounter for closed fracture with routine healing: Secondary | ICD-10-CM | POA: Diagnosis not present

## 2023-12-11 DIAGNOSIS — R2689 Other abnormalities of gait and mobility: Secondary | ICD-10-CM | POA: Diagnosis not present

## 2023-12-11 DIAGNOSIS — S52002D Unspecified fracture of upper end of left ulna, subsequent encounter for closed fracture with routine healing: Secondary | ICD-10-CM | POA: Diagnosis not present

## 2023-12-11 DIAGNOSIS — S43492D Other sprain of left shoulder joint, subsequent encounter: Secondary | ICD-10-CM | POA: Diagnosis not present

## 2023-12-11 DIAGNOSIS — M79602 Pain in left arm: Secondary | ICD-10-CM | POA: Diagnosis not present

## 2023-12-11 DIAGNOSIS — Z9181 History of falling: Secondary | ICD-10-CM | POA: Diagnosis not present

## 2023-12-12 DIAGNOSIS — M7022 Olecranon bursitis, left elbow: Secondary | ICD-10-CM | POA: Diagnosis not present

## 2023-12-15 DIAGNOSIS — R2689 Other abnormalities of gait and mobility: Secondary | ICD-10-CM | POA: Diagnosis not present

## 2023-12-15 DIAGNOSIS — R531 Weakness: Secondary | ICD-10-CM | POA: Diagnosis not present

## 2023-12-15 DIAGNOSIS — S52002D Unspecified fracture of upper end of left ulna, subsequent encounter for closed fracture with routine healing: Secondary | ICD-10-CM | POA: Diagnosis not present

## 2023-12-15 DIAGNOSIS — M6259 Muscle wasting and atrophy, not elsewhere classified, multiple sites: Secondary | ICD-10-CM | POA: Diagnosis not present

## 2023-12-15 DIAGNOSIS — S52022D Displaced fracture of olecranon process without intraarticular extension of left ulna, subsequent encounter for closed fracture with routine healing: Secondary | ICD-10-CM | POA: Diagnosis not present

## 2023-12-15 DIAGNOSIS — M79602 Pain in left arm: Secondary | ICD-10-CM | POA: Diagnosis not present

## 2023-12-17 DIAGNOSIS — S52002D Unspecified fracture of upper end of left ulna, subsequent encounter for closed fracture with routine healing: Secondary | ICD-10-CM | POA: Diagnosis not present

## 2023-12-17 DIAGNOSIS — R531 Weakness: Secondary | ICD-10-CM | POA: Diagnosis not present

## 2023-12-17 DIAGNOSIS — M79602 Pain in left arm: Secondary | ICD-10-CM | POA: Diagnosis not present

## 2023-12-17 DIAGNOSIS — M6259 Muscle wasting and atrophy, not elsewhere classified, multiple sites: Secondary | ICD-10-CM | POA: Diagnosis not present

## 2023-12-17 DIAGNOSIS — R2689 Other abnormalities of gait and mobility: Secondary | ICD-10-CM | POA: Diagnosis not present

## 2023-12-17 DIAGNOSIS — S52022D Displaced fracture of olecranon process without intraarticular extension of left ulna, subsequent encounter for closed fracture with routine healing: Secondary | ICD-10-CM | POA: Diagnosis not present

## 2023-12-18 ENCOUNTER — Ambulatory Visit: Payer: Self-pay | Admitting: Internal Medicine

## 2023-12-18 NOTE — Telephone Encounter (Signed)
  Chief Complaint: left great toe red and painful Symptoms: redness and pain in left great toe Frequency: x 5 days Pertinent Negatives: Patient denies bruising, injuries to left foot or toe, history of gout, rash, fever, numbness. Disposition: [] ED /[] Urgent Care (no appt availability in office) / [x] Appointment(In office/virtual)/ []  [facility]/ [] Home Care/ [] Refused Recommended Disposition /[]  Mobile Bus/ []  Follow-up with PCP Additional Notes: Patient agreeable to acute visit tomorrow afternoon with available provider.   Copied from CRM [id number]. Topic: Clinical - Red Word Triage >> Dec 18, 2023  3:46 PM Deaijah H wrote: Red Word that prompted transfer to Nurse Triage: Swelling toe (painful) Reason for Disposition  [1] Swollen toe AND [2] no fever  (Exceptions: Just a localized bump from bunion, corns, insect bite, sting.)  Answer Assessment - Initial Assessment Questions 1. ONSET: "When did the pain start?"      X 5 days.  2. LOCATION: "Where is the pain located?"   (e.g., around nail, entire toe, at foot joint)      Left great toe, just the nailbed.  3. PAIN: "How bad is the pain?"    (Scale 1-10; or mild, moderate, severe)   -  MILD (1-3): doesn't interfere with normal activities    -  MODERATE (4-7): interferes with normal activities (e.g., work or school) or awakens from sleep, limping    -  SEVERE (8-10): excruciating pain, unable to do any normal activities, unable to walk     7/10. She states she just took Tylenol.  4. APPEARANCE: "What does the toe look like?" (e.g., redness, swelling, bruising, pallor)     Swelling, redness and "it's very even it just goes to the knuckle".  5. CAUSE: "What do you think is causing the toe pain?"     Unsure.  6. OTHER SYMPTOMS: "Do you have any other symptoms?" (e.g., leg pain, rash, fever, numbness)     Denies.  Protocols used: Toe Pain-A-AH

## 2023-12-19 ENCOUNTER — Ambulatory Visit (INDEPENDENT_AMBULATORY_CARE_PROVIDER_SITE_OTHER): Payer: Medicare Other | Admitting: Nurse Practitioner

## 2023-12-19 VITALS — BP 128/76 | HR 71 | Temp 97.9°F | Ht 60.0 in | Wt 116.5 lb

## 2023-12-19 DIAGNOSIS — L03818 Cellulitis of other sites: Secondary | ICD-10-CM | POA: Diagnosis not present

## 2023-12-19 DIAGNOSIS — M6259 Muscle wasting and atrophy, not elsewhere classified, multiple sites: Secondary | ICD-10-CM | POA: Diagnosis not present

## 2023-12-19 DIAGNOSIS — S52002D Unspecified fracture of upper end of left ulna, subsequent encounter for closed fracture with routine healing: Secondary | ICD-10-CM | POA: Diagnosis not present

## 2023-12-19 DIAGNOSIS — S52022D Displaced fracture of olecranon process without intraarticular extension of left ulna, subsequent encounter for closed fracture with routine healing: Secondary | ICD-10-CM | POA: Diagnosis not present

## 2023-12-19 DIAGNOSIS — R531 Weakness: Secondary | ICD-10-CM | POA: Diagnosis not present

## 2023-12-19 DIAGNOSIS — R2689 Other abnormalities of gait and mobility: Secondary | ICD-10-CM | POA: Diagnosis not present

## 2023-12-19 DIAGNOSIS — M79602 Pain in left arm: Secondary | ICD-10-CM | POA: Diagnosis not present

## 2023-12-19 DIAGNOSIS — L0390 Cellulitis, unspecified: Secondary | ICD-10-CM | POA: Insufficient documentation

## 2023-12-19 MED ORDER — DOXYCYCLINE HYCLATE 100 MG PO TABS: 100.0000 mg | ORAL_TABLET | Freq: Two times a day (BID) | ORAL | 0 refills | Status: DC

## 2023-12-19 NOTE — Assessment & Plan Note (Addendum)
Acute Presentation appears more consistent with early onset cellulitis of tissue around the nailbed.  Also questionable gout, but tenderness is quite mild even to palpation.  For now we will treat with oral doxycycline 100 mg twice daily x 10 days.  She has follow-up with PCP already scheduled next week and I have encouraged her to keep this appointment for close monitoring.  We discussed risks and benefits of doxycycline and she was told that if she experiences severe abdominal pain or excessive diarrhea she should call the office for evaluation.  She reports her understanding.

## 2023-12-19 NOTE — Progress Notes (Signed)
   Established Patient Office Visit  Subjective   Patient ID: [name], female    DOB: 1941/02/14  Age: 83 y.o. MRN: [id number]  No chief complaint on file.   Painful left great toe x 1 week.  She reports she was recently in a rehab facility and while there had a pedicure done.  Since then she has been getting progressively worsening pain and swelling in her left great toe.  Denies any personal history of gout.  She is here today for further evaluation.        Objective:     BP 128/76   Pulse 71   Temp 97.9 F (36.6 C) (Temporal)   Ht 5' (1.524 m)   Wt 116 lb 8 oz (52.8 kg)   SpO2 96%   BMI 22.75 kg/m    Physical Exam Vitals reviewed.  Constitutional:      General: She is not in acute distress.    Appearance: Normal appearance.  HENT:     Head: Normocephalic and atraumatic.  Neck:     Vascular: No carotid bruit.  Cardiovascular:     Rate and Rhythm: Normal rate and regular rhythm.     Pulses: Normal pulses.          Dorsalis pedis pulses are 2+ on the left side.     Heart sounds: Normal heart sounds.  Pulmonary:     Effort: Pulmonary effort is normal.     Breath sounds: Normal breath sounds.  Musculoskeletal:       Feet:  Feet:     Left foot:     Skin integrity: Erythema and warmth present. No ulcer or blister.  Skin:    General: Skin is warm and dry.  Neurological:     General: No focal deficit present.     Mental Status: She is alert and oriented to person, place, and time.  Psychiatric:        Mood and Affect: Mood normal.        Behavior: Behavior normal.        Judgment: Judgment normal.      No results found for any visits on 12/19/23.    The ASCVD Risk score (Arnett DK, et al., 2019) failed to calculate for the following reasons:   The 2019 ASCVD risk score is only valid for ages 102 to 32    Assessment & Plan:   Problem List Items Addressed This Visit       Other   Cellulitis - Primary   Acute Presentation appears more  consistent with early onset cellulitis of tissue around the nailbed.  Also questionable gout, but tenderness is quite mild even to palpation.  For now we will treat with oral doxycycline 100 mg twice daily x 10 days.  She has follow-up with PCP already scheduled next week and I have encouraged her to keep this appointment for close monitoring.  We discussed risks and benefits of doxycycline and she was told that if she experiences severe abdominal pain or excessive diarrhea she should call the office for evaluation.  She reports her understanding.      Relevant Medications   doxycycline (VIBRA-TABS) 100 MG tablet    Return if symptoms worsen or fail to improve.    Elenore Paddy, NP

## 2023-12-22 DIAGNOSIS — S43492D Other sprain of left shoulder joint, subsequent encounter: Secondary | ICD-10-CM | POA: Diagnosis not present

## 2023-12-22 DIAGNOSIS — R2689 Other abnormalities of gait and mobility: Secondary | ICD-10-CM | POA: Diagnosis not present

## 2023-12-22 DIAGNOSIS — M6259 Muscle wasting and atrophy, not elsewhere classified, multiple sites: Secondary | ICD-10-CM | POA: Diagnosis not present

## 2023-12-22 DIAGNOSIS — M79602 Pain in left arm: Secondary | ICD-10-CM | POA: Diagnosis not present

## 2023-12-22 DIAGNOSIS — R531 Weakness: Secondary | ICD-10-CM | POA: Diagnosis not present

## 2023-12-22 DIAGNOSIS — R278 Other lack of coordination: Secondary | ICD-10-CM | POA: Diagnosis not present

## 2023-12-22 DIAGNOSIS — S52022D Displaced fracture of olecranon process without intraarticular extension of left ulna, subsequent encounter for closed fracture with routine healing: Secondary | ICD-10-CM | POA: Diagnosis not present

## 2023-12-22 DIAGNOSIS — Z9181 History of falling: Secondary | ICD-10-CM | POA: Diagnosis not present

## 2023-12-22 DIAGNOSIS — S52002D Unspecified fracture of upper end of left ulna, subsequent encounter for closed fracture with routine healing: Secondary | ICD-10-CM | POA: Diagnosis not present

## 2023-12-23 ENCOUNTER — Encounter: Payer: Self-pay | Admitting: Internal Medicine

## 2023-12-23 ENCOUNTER — Ambulatory Visit: Payer: Medicare Other | Admitting: Internal Medicine

## 2023-12-23 VITALS — BP 110/60 | HR 66 | Temp 98.3°F | Ht 60.0 in | Wt 118.0 lb

## 2023-12-23 DIAGNOSIS — M79602 Pain in left arm: Secondary | ICD-10-CM | POA: Diagnosis not present

## 2023-12-23 DIAGNOSIS — M6259 Muscle wasting and atrophy, not elsewhere classified, multiple sites: Secondary | ICD-10-CM | POA: Diagnosis not present

## 2023-12-23 DIAGNOSIS — R2689 Other abnormalities of gait and mobility: Secondary | ICD-10-CM | POA: Diagnosis not present

## 2023-12-23 DIAGNOSIS — L600 Ingrowing nail: Secondary | ICD-10-CM | POA: Insufficient documentation

## 2023-12-23 DIAGNOSIS — I10 Essential (primary) hypertension: Secondary | ICD-10-CM | POA: Diagnosis not present

## 2023-12-23 DIAGNOSIS — M810 Age-related osteoporosis without current pathological fracture: Secondary | ICD-10-CM

## 2023-12-23 DIAGNOSIS — K5901 Slow transit constipation: Secondary | ICD-10-CM | POA: Diagnosis not present

## 2023-12-23 DIAGNOSIS — R278 Other lack of coordination: Secondary | ICD-10-CM | POA: Diagnosis not present

## 2023-12-23 DIAGNOSIS — S52002D Unspecified fracture of upper end of left ulna, subsequent encounter for closed fracture with routine healing: Secondary | ICD-10-CM | POA: Diagnosis not present

## 2023-12-23 DIAGNOSIS — R531 Weakness: Secondary | ICD-10-CM | POA: Diagnosis not present

## 2023-12-23 DIAGNOSIS — R2681 Unsteadiness on feet: Secondary | ICD-10-CM

## 2023-12-23 LAB — CBC WITH DIFFERENTIAL/PLATELET
Basophils Absolute: 0.1 10*3/uL (ref 0.0–0.1)
Basophils Relative: 0.8 % (ref 0.0–3.0)
Eosinophils Absolute: 0.1 10*3/uL (ref 0.0–0.7)
Eosinophils Relative: 2.2 % (ref 0.0–5.0)
HCT: 41 % (ref 36.0–46.0)
Hemoglobin: 13.6 g/dL (ref 12.0–15.0)
Lymphocytes Relative: 25.3 % (ref 12.0–46.0)
Lymphs Abs: 1.6 10*3/uL (ref 0.7–4.0)
MCHC: 33.1 g/dL (ref 30.0–36.0)
MCV: 97.4 fL (ref 78.0–100.0)
Monocytes Absolute: 0.7 10*3/uL (ref 0.1–1.0)
Monocytes Relative: 11.3 % (ref 3.0–12.0)
Neutro Abs: 3.7 10*3/uL (ref 1.4–7.7)
Neutrophils Relative %: 60.4 % (ref 43.0–77.0)
Platelets: 217 10*3/uL (ref 150.0–400.0)
RBC: 4.21 Mil/uL (ref 3.87–5.11)
RDW: 14.2 % (ref 11.5–15.5)
WBC: 6.2 10*3/uL (ref 4.0–10.5)

## 2023-12-23 LAB — COMPREHENSIVE METABOLIC PANEL
ALT: 15 U/L (ref 0–35)
AST: 18 U/L (ref 0–37)
Albumin: 4.1 g/dL (ref 3.5–5.2)
Alkaline Phosphatase: 64 U/L (ref 39–117)
BUN: 19 mg/dL (ref 6–23)
CO2: 27 meq/L (ref 19–32)
Calcium: 10.3 mg/dL (ref 8.4–10.5)
Chloride: 105 meq/L (ref 96–112)
Creatinine, Ser: 0.77 mg/dL (ref 0.40–1.20)
GFR: 71.15 mL/min (ref >60.00)
Glucose, Bld: 99 mg/dL (ref 70–99)
Potassium: 4.1 meq/L (ref 3.5–5.1)
Sodium: 140 meq/L (ref 135–145)
Total Bilirubin: 0.7 mg/dL (ref 0.2–1.2)
Total Protein: 6.5 g/dL (ref 6.0–8.3)

## 2023-12-23 LAB — TSH: TSH: 1.26 u[IU]/mL (ref 0.35–5.50)

## 2023-12-23 NOTE — Assessment & Plan Note (Signed)
In PT 

## 2023-12-23 NOTE — Assessment & Plan Note (Signed)
Nl BP now 

## 2023-12-23 NOTE — Assessment & Plan Note (Addendum)
L big toe pain and medial swelling - better Finish po abx Wear sandals Podiatry ref

## 2023-12-23 NOTE — Progress Notes (Signed)
 Subjective:  Patient ID: [name], female    DOB: [birth year]  Age: 83 y.o. MRN: [id number]  CC: Medical Management of Chronic Issues (Follow up on broken arm.. Pt also has a swollen left big toe that is causing her significant pain.)   HPI [name] presents for L big toe pain and swelling - better F/u on  LEFT OLECRANON FRACTURE,BURSITIS. S/p L OLECRANON (ELBOW)ORIF in 10/2023. In PT now S/p fall on Nov 21/2024  F/u on L foot big toe cellulitis  Outpatient Medications Prior to Visit  Medication Sig Dispense Refill   acetaminophen  (TYLENOL ) 500 MG tablet Take 2 tablets (1,000 mg total) by mouth 2 (two) times daily.     acetaminophen  (TYLENOL ) 500 MG tablet Take 2 tablets (1,000 mg total) by mouth daily as needed.     amLODipine  (NORVASC ) 5 MG tablet Take 1 tablet (5 mg total) by mouth daily. (Patient taking differently: Take 5 mg by mouth every evening.)     b complex vitamins capsule Take 1 capsule by mouth daily.     Cholecalciferol (VITAMIN D3) 125 MCG (5000 UT) CAPS Take 1 capsule (5,000 Units total) by mouth daily.     denosumab  (PROLIA ) 60 MG/ML SOSY injection Inject 60 mg into the skin every 6 (six) months.     doxycycline  (VIBRA -TABS) 100 MG tablet Take 1 tablet (100 mg total) by mouth 2 (two) times daily. 20 tablet 0   hydrALAZINE  (APRESOLINE ) 10 MG tablet Take by mouth.     LORazepam  (ATIVAN ) 0.5 MG tablet Take 1 tablet (0.5 mg total) by mouth 2 (two) times daily as needed for anxiety. 30 tablet 1   olmesartan  (BENICAR ) 20 MG tablet Take 1 tablet (20 mg total) by mouth 2 (two) times daily. 180 tablet 3   ondansetron  (ZOFRAN -ODT) 4 MG disintegrating tablet Take 4 mg by mouth every 8 (eight) hours as needed for nausea or vomiting.     oxycodone  (OXY-IR) 5 MG capsule Take 2.5 mg by mouth every 4 (four) hours as needed for pain.     polyethylene glycol (MIRALAX  / GLYCOLAX ) 17 g packet Take 17 g by mouth daily as needed for mild constipation.     polyethylene glycol  powder (GLYCOLAX /MIRALAX ) 17 GM/SCOOP powder Take 17 g by mouth daily.     senna (SENOKOT) 8.6 MG TABS tablet Take 2 tablets (17.2 mg total) by mouth at bedtime as needed for mild constipation. (Patient taking differently: Take 2 tablets by mouth at bedtime.)     traZODone  (DESYREL ) 50 MG tablet Take 25 mg by mouth at bedtime.     No facility-administered medications prior to visit.    ROS: Review of Systems  Constitutional:  Negative for activity change, appetite change, chills, fatigue and unexpected weight change.  HENT:  Negative for congestion, mouth sores and sinus pressure.   Eyes:  Negative for visual disturbance.  Respiratory:  Negative for cough and chest tightness.   Gastrointestinal:  Negative for abdominal pain and nausea.  Genitourinary:  Negative for difficulty urinating, frequency and vaginal pain.  Musculoskeletal:  Positive for gait problem. Negative for back pain.  Skin:  Positive for color change. Negative for pallor, rash and wound.  Neurological:  Negative for dizziness, tremors, weakness, numbness and headaches.  Psychiatric/Behavioral:  Positive for decreased concentration. Negative for confusion, sleep disturbance and suicidal ideas.     Objective:  BP 110/60 (BP Location: Left Arm, Patient Position: Sitting, Cuff Size: Normal)   Pulse 66   Temp  98.3 F (36.8 C) (Oral)   Ht 5' (1.524 m)   Wt 118 lb (53.5 kg)   SpO2 95%   BMI 23.05 kg/m   BP Readings from Last 3 Encounters:  12/23/23 110/60  12/19/23 128/76  12/09/23 134/66    Wt Readings from Last 3 Encounters:  12/23/23 118 lb (53.5 kg)  12/19/23 116 lb 8 oz (52.8 kg)  12/09/23 116 lb 12.8 oz (53 kg)    Physical Exam Constitutional:      General: She is not in acute distress.    Appearance: Normal appearance. She is well-developed.  HENT:     Head: Normocephalic.     Right Ear: External ear normal.     Left Ear: External ear normal.     Nose: Nose normal.  Eyes:     General:         Right eye: No discharge.        Left eye: No discharge.     Conjunctiva/sclera: Conjunctivae normal.     Pupils: Pupils are equal, round, and reactive to light.  Neck:     Thyroid : No thyromegaly.     Vascular: No JVD.     Trachea: No tracheal deviation.  Cardiovascular:     Rate and Rhythm: Normal rate and regular rhythm.     Heart sounds: Normal heart sounds.  Pulmonary:     Effort: No respiratory distress.     Breath sounds: No stridor. No wheezing.  Abdominal:     General: Bowel sounds are normal. There is no distension.     Palpations: Abdomen is soft. There is no mass.     Tenderness: There is no abdominal tenderness. There is no guarding or rebound.  Musculoskeletal:        General: Tenderness present.     Cervical back: Normal range of motion and neck supple. No rigidity.     Right lower leg: No edema.     Left lower leg: No edema.  Lymphadenopathy:     Cervical: No cervical adenopathy.  Skin:    Findings: Bruising present. No erythema or rash.  Neurological:     Mental Status: Mental status is at baseline.     Cranial Nerves: No cranial nerve deficit.     Motor: No abnormal muscle tone.     Coordination: Coordination abnormal.     Gait: Gait abnormal.     Deep Tendon Reflexes: Reflexes normal.  Psychiatric:        Behavior: Behavior normal.        Thought Content: Thought content normal.        Judgment: Judgment normal.   L big toe pain and medial swelling - better L elbow - is healing  Lab Results  Component Value Date   WBC 5.7 10/21/2023   HGB 12.6 10/21/2023   HCT 38.5 10/21/2023   PLT 213 10/21/2023   GLUCOSE 95 10/21/2023   CHOL 132 01/25/2015   TRIG 64 01/25/2015   HDL 68 01/25/2015   LDLDIRECT 156.3 03/28/2011   LDLCALC 51 01/25/2015   ALT 12 03/18/2023   AST 19 03/18/2023   NA 138 10/21/2023   K 3.9 10/21/2023   CL 108 10/21/2023   CREATININE 0.66 10/21/2023   BUN 19 10/21/2023   CO2 23 10/21/2023   TSH 1.20 03/18/2023   INR 1.0  04/01/2017    No results found.  Assessment & Plan:   Problem List Items Addressed This Visit     Essential hypertension (Chronic)  Nl BP now      Relevant Medications   hydrALAZINE  (APRESOLINE ) 10 MG tablet   Other Relevant Orders   CBC with Differential/Platelet   Comprehensive metabolic panel   TSH   Constipation   Osteoporosis   On Vit D, Prolia       Unsteady gait when walking   In PT      Relevant Orders   CBC with Differential/Platelet   Comprehensive metabolic panel   TSH   Ingrowing nail, left great toe - Primary   L big toe pain and medial swelling - better Finish po abx Wear sandals Podiatry ref      Relevant Orders   Ambulatory referral to Podiatry   CBC with Differential/Platelet   Comprehensive metabolic panel   TSH      No orders of the defined types were placed in this encounter.     Follow-up: Return in about 3 months (around 03/21/2024) for a follow-up visit.  Marolyn Noel, MD

## 2023-12-23 NOTE — Assessment & Plan Note (Signed)
On Vit D, Prolia

## 2023-12-24 ENCOUNTER — Encounter: Payer: Self-pay | Admitting: Podiatry

## 2023-12-24 ENCOUNTER — Ambulatory Visit (INDEPENDENT_AMBULATORY_CARE_PROVIDER_SITE_OTHER): Payer: Medicare Other | Admitting: Podiatry

## 2023-12-24 DIAGNOSIS — L600 Ingrowing nail: Secondary | ICD-10-CM | POA: Diagnosis not present

## 2023-12-24 NOTE — Progress Notes (Signed)
 Subjective:  Patient ID: [name] Caller, female    DOB: [birth year],   MRN: [id number]  No chief complaint on file.   83 y.o. female presents for concern of left ingrown toenail. Relates this has been bothering her for a while. She was seen by PCP and placed on antibiotics and told to follow-up . Relates no improvement. Wondering about options.  . Denies any other pedal complaints. Denies n/v/f/c.   Past Medical History:  Diagnosis Date   Abdominal pain, unspecified site    ADVERSE REACTION TO MEDICATION 07/31/2009   ANEMIA-NOS 07/07/2007   Anxiety    Arthritis    Chest pain, unspecified    Chronic kidney disease    hx of kidney stone   COLONIC POLYPS, ADENOMATOUS, HX OF    Complication of anesthesia    Dementia (HCC)    Disturbance of skin sensation    DIVERTICULOSIS, COLON 02/23/2009   Dyspnea    Family history of diabetes mellitus    Family history of malignant neoplasm of breast    FATIGUE 05/25/2009   Fever, unspecified    Heart murmur    hx of   HYPERLIPIDEMIA 07/07/2007   HYPERPARATHYROIDISM UNSPECIFIED 10/20/2007   HYPERTENSION 02/04/2008   Incomplete right bundle branch block (RBBB) determined by electrocardiography 04/15/2023   Irritable bowel syndrome (IBS)    NEPHROLITHIASIS, HX OF 11/15/2008   OSTEOPOROSIS 01/01/2008   Other acquired absence of organ    parathyroidectomy   Pain in limb    PALPITATIONS, OCCASIONAL 07/12/2008   PARATHYROIDECTOMY 01/02/2009   PONV (postoperative nausea and vomiting)    SCOLIOSIS 05/25/2009   Seasonal allergies    Unspecified constipation    URINARY INCONTINENCE 07/07/2007   UTI'S, RECURRENT 11/20/2009   kimbrough     Objective:  Physical Exam: Vascular: DP/PT pulses 2/4 bilateral. CFT <3 seconds. Normal hair growth on digits. No edema.  Skin. No lacerations or abrasions bilateral feet. Incurvaiton of medial border of left hallux. No erythema edema or purulence noted.  Musculoskeletal: MMT 5/5 bilateral lower  extremities in DF, PF, Inversion and Eversion. Deceased ROM in DF of ankle joint.  Neurological: Sensation intact to light touch.   Assessment:   1. Ingrown left greater toenail      Plan:  Patient was evaluated and treated and all questions answered. Discussed ingrown toenails etiology and treatment options including procedure for removal vs conservative care.  Patient requesting removal of ingrown nail today. Procedure below.  Discussed procedure and post procedure care and patient expressed understanding.  Will follow-up in 2 weeks for nail check or sooner if any problems arise.    Procedure:  Procedure: partial Nail Avulsion of left hallux medial nail border.  Surgeon: Asberry Failing, DPM  Pre-op  Dx: Ingrown toenail without infection Post-op: Same  Place of Surgery: Office exam room.  Indications for surgery: Painful and ingrown toenail.    The patient is requesting removal of nail with  chemical matrixectomy. Risks and complications were discussed with the patient for which they understand and written consent was obtained. Under sterile conditions a total of 3 mL of  1% lidocaine  plain was infiltrated in a hallux block fashion. Once anesthetized, the skin was prepped in sterile fashion. A tourniquet was then applied. Next the medial aspect of hallux nail border was then sharply excised making sure to remove the entire offending nail border.  Next phenol was then applied under standard conditions to permanently destroy the matrix and copiously irrigated. Silvadene was applied. A dry sterile  dressing was applied. After application of the dressing the tourniquet was removed and there is found to be an immediate capillary refill time to the digit. The patient tolerated the procedure well without any complications. Post procedure instructions were discussed the patient for which he verbally understood. Follow-up in two weeks for nail check or sooner if any problems are to arise. Discussed  signs/symptoms of infection and directed to call the office immediately should any occur or go directly to the emergency room. In the meantime, encouraged to call the office with any questions, concerns, changes symptoms.   Asberry Failing, DPM

## 2023-12-24 NOTE — Patient Instructions (Signed)

## 2023-12-25 DIAGNOSIS — S52002D Unspecified fracture of upper end of left ulna, subsequent encounter for closed fracture with routine healing: Secondary | ICD-10-CM | POA: Diagnosis not present

## 2023-12-25 DIAGNOSIS — M79602 Pain in left arm: Secondary | ICD-10-CM | POA: Diagnosis not present

## 2023-12-25 DIAGNOSIS — R2689 Other abnormalities of gait and mobility: Secondary | ICD-10-CM | POA: Diagnosis not present

## 2023-12-25 DIAGNOSIS — R531 Weakness: Secondary | ICD-10-CM | POA: Diagnosis not present

## 2023-12-25 DIAGNOSIS — R278 Other lack of coordination: Secondary | ICD-10-CM | POA: Diagnosis not present

## 2023-12-25 DIAGNOSIS — M6259 Muscle wasting and atrophy, not elsewhere classified, multiple sites: Secondary | ICD-10-CM | POA: Diagnosis not present

## 2023-12-26 DIAGNOSIS — R2689 Other abnormalities of gait and mobility: Secondary | ICD-10-CM | POA: Diagnosis not present

## 2023-12-26 DIAGNOSIS — S52002D Unspecified fracture of upper end of left ulna, subsequent encounter for closed fracture with routine healing: Secondary | ICD-10-CM | POA: Diagnosis not present

## 2023-12-26 DIAGNOSIS — R531 Weakness: Secondary | ICD-10-CM | POA: Diagnosis not present

## 2023-12-26 DIAGNOSIS — M79602 Pain in left arm: Secondary | ICD-10-CM | POA: Diagnosis not present

## 2023-12-26 DIAGNOSIS — M6259 Muscle wasting and atrophy, not elsewhere classified, multiple sites: Secondary | ICD-10-CM | POA: Diagnosis not present

## 2023-12-26 DIAGNOSIS — R278 Other lack of coordination: Secondary | ICD-10-CM | POA: Diagnosis not present

## 2023-12-29 DIAGNOSIS — M79602 Pain in left arm: Secondary | ICD-10-CM | POA: Diagnosis not present

## 2023-12-29 DIAGNOSIS — R531 Weakness: Secondary | ICD-10-CM | POA: Diagnosis not present

## 2023-12-29 DIAGNOSIS — M6259 Muscle wasting and atrophy, not elsewhere classified, multiple sites: Secondary | ICD-10-CM | POA: Diagnosis not present

## 2023-12-29 DIAGNOSIS — R2689 Other abnormalities of gait and mobility: Secondary | ICD-10-CM | POA: Diagnosis not present

## 2023-12-29 DIAGNOSIS — S52002D Unspecified fracture of upper end of left ulna, subsequent encounter for closed fracture with routine healing: Secondary | ICD-10-CM | POA: Diagnosis not present

## 2023-12-29 DIAGNOSIS — R278 Other lack of coordination: Secondary | ICD-10-CM | POA: Diagnosis not present

## 2023-12-30 ENCOUNTER — Ambulatory Visit: Payer: Medicare Other | Admitting: Internal Medicine

## 2023-12-31 DIAGNOSIS — M6259 Muscle wasting and atrophy, not elsewhere classified, multiple sites: Secondary | ICD-10-CM | POA: Diagnosis not present

## 2023-12-31 DIAGNOSIS — R2689 Other abnormalities of gait and mobility: Secondary | ICD-10-CM | POA: Diagnosis not present

## 2023-12-31 DIAGNOSIS — R278 Other lack of coordination: Secondary | ICD-10-CM | POA: Diagnosis not present

## 2023-12-31 DIAGNOSIS — R531 Weakness: Secondary | ICD-10-CM | POA: Diagnosis not present

## 2023-12-31 DIAGNOSIS — S52002D Unspecified fracture of upper end of left ulna, subsequent encounter for closed fracture with routine healing: Secondary | ICD-10-CM | POA: Diagnosis not present

## 2023-12-31 DIAGNOSIS — M79602 Pain in left arm: Secondary | ICD-10-CM | POA: Diagnosis not present

## 2024-01-02 DIAGNOSIS — R531 Weakness: Secondary | ICD-10-CM | POA: Diagnosis not present

## 2024-01-02 DIAGNOSIS — M79602 Pain in left arm: Secondary | ICD-10-CM | POA: Diagnosis not present

## 2024-01-02 DIAGNOSIS — R2689 Other abnormalities of gait and mobility: Secondary | ICD-10-CM | POA: Diagnosis not present

## 2024-01-02 DIAGNOSIS — R278 Other lack of coordination: Secondary | ICD-10-CM | POA: Diagnosis not present

## 2024-01-02 DIAGNOSIS — M6259 Muscle wasting and atrophy, not elsewhere classified, multiple sites: Secondary | ICD-10-CM | POA: Diagnosis not present

## 2024-01-02 DIAGNOSIS — S52002D Unspecified fracture of upper end of left ulna, subsequent encounter for closed fracture with routine healing: Secondary | ICD-10-CM | POA: Diagnosis not present

## 2024-01-05 DIAGNOSIS — M79602 Pain in left arm: Secondary | ICD-10-CM | POA: Diagnosis not present

## 2024-01-05 DIAGNOSIS — R278 Other lack of coordination: Secondary | ICD-10-CM | POA: Diagnosis not present

## 2024-01-05 DIAGNOSIS — R531 Weakness: Secondary | ICD-10-CM | POA: Diagnosis not present

## 2024-01-05 DIAGNOSIS — R2689 Other abnormalities of gait and mobility: Secondary | ICD-10-CM | POA: Diagnosis not present

## 2024-01-05 DIAGNOSIS — S52002D Unspecified fracture of upper end of left ulna, subsequent encounter for closed fracture with routine healing: Secondary | ICD-10-CM | POA: Diagnosis not present

## 2024-01-05 DIAGNOSIS — M6259 Muscle wasting and atrophy, not elsewhere classified, multiple sites: Secondary | ICD-10-CM | POA: Diagnosis not present

## 2024-01-06 ENCOUNTER — Other Ambulatory Visit: Payer: Self-pay | Admitting: Internal Medicine

## 2024-01-06 DIAGNOSIS — R531 Weakness: Secondary | ICD-10-CM | POA: Diagnosis not present

## 2024-01-06 DIAGNOSIS — R2689 Other abnormalities of gait and mobility: Secondary | ICD-10-CM | POA: Diagnosis not present

## 2024-01-06 DIAGNOSIS — M6259 Muscle wasting and atrophy, not elsewhere classified, multiple sites: Secondary | ICD-10-CM | POA: Diagnosis not present

## 2024-01-06 DIAGNOSIS — S52002D Unspecified fracture of upper end of left ulna, subsequent encounter for closed fracture with routine healing: Secondary | ICD-10-CM | POA: Diagnosis not present

## 2024-01-06 DIAGNOSIS — R278 Other lack of coordination: Secondary | ICD-10-CM | POA: Diagnosis not present

## 2024-01-06 DIAGNOSIS — M79602 Pain in left arm: Secondary | ICD-10-CM | POA: Diagnosis not present

## 2024-01-07 ENCOUNTER — Telehealth: Payer: Self-pay | Admitting: Podiatry

## 2024-01-07 ENCOUNTER — Ambulatory Visit: Payer: Medicare Other | Admitting: Podiatry

## 2024-01-07 NOTE — Telephone Encounter (Signed)
Pt called and left message today at 1030am stating she was scheduled for today but was r/s for 2/26 and she would like to be seen sooner than that.  I called and left message for pt that I could possibly get her in 2/25 but Dr Ralene Cork is not in [place] office rest of the week. Unless she would like to see another provider

## 2024-01-08 ENCOUNTER — Encounter: Payer: Self-pay | Admitting: Podiatry

## 2024-01-08 ENCOUNTER — Ambulatory Visit (INDEPENDENT_AMBULATORY_CARE_PROVIDER_SITE_OTHER): Payer: Medicare Other | Admitting: Podiatry

## 2024-01-08 DIAGNOSIS — L03032 Cellulitis of left toe: Secondary | ICD-10-CM

## 2024-01-08 MED ORDER — DOXYCYCLINE HYCLATE 100 MG PO TABS: 100.0000 mg | ORAL_TABLET | Freq: Two times a day (BID) | ORAL | 0 refills | Status: DC

## 2024-01-08 NOTE — Progress Notes (Signed)
Subjective:   Patient ID: [name], female   DOB: 83 y.o.   MRN: [id number]   HPI Patient states that she had a nail done approximately 2 weeks ago and she is having a lot of pain on top of the left big toe and is concerned about infection   ROS      Objective:  Physical Exam  Neuro vascular status intact with patient found to have inflammation pain of the dorsal left hallux with crusted tissue on the inside     Assessment:  Chronic ingrown toenail deformity left hallux corrected but may have low-grade paronychia infection     Plan:  H&P reviewed discussed at great length and reviewed with patient causes of the symptoms that she is experiencing and at this point did sharp debride the distal tissue and instructed on soaks and due to some redness around the area I did precautionary place her back on doxycycline for 7 days.  Strict instructions to call if any issues were to occur

## 2024-01-09 DIAGNOSIS — R2689 Other abnormalities of gait and mobility: Secondary | ICD-10-CM | POA: Diagnosis not present

## 2024-01-09 DIAGNOSIS — S52002D Unspecified fracture of upper end of left ulna, subsequent encounter for closed fracture with routine healing: Secondary | ICD-10-CM | POA: Diagnosis not present

## 2024-01-09 DIAGNOSIS — R531 Weakness: Secondary | ICD-10-CM | POA: Diagnosis not present

## 2024-01-09 DIAGNOSIS — M6259 Muscle wasting and atrophy, not elsewhere classified, multiple sites: Secondary | ICD-10-CM | POA: Diagnosis not present

## 2024-01-09 DIAGNOSIS — R278 Other lack of coordination: Secondary | ICD-10-CM | POA: Diagnosis not present

## 2024-01-09 DIAGNOSIS — M79602 Pain in left arm: Secondary | ICD-10-CM | POA: Diagnosis not present

## 2024-01-12 DIAGNOSIS — M6259 Muscle wasting and atrophy, not elsewhere classified, multiple sites: Secondary | ICD-10-CM | POA: Diagnosis not present

## 2024-01-12 DIAGNOSIS — S52002D Unspecified fracture of upper end of left ulna, subsequent encounter for closed fracture with routine healing: Secondary | ICD-10-CM | POA: Diagnosis not present

## 2024-01-12 DIAGNOSIS — R278 Other lack of coordination: Secondary | ICD-10-CM | POA: Diagnosis not present

## 2024-01-12 DIAGNOSIS — R531 Weakness: Secondary | ICD-10-CM | POA: Diagnosis not present

## 2024-01-12 DIAGNOSIS — R2689 Other abnormalities of gait and mobility: Secondary | ICD-10-CM | POA: Diagnosis not present

## 2024-01-12 DIAGNOSIS — M79602 Pain in left arm: Secondary | ICD-10-CM | POA: Diagnosis not present

## 2024-01-14 ENCOUNTER — Ambulatory Visit (INDEPENDENT_AMBULATORY_CARE_PROVIDER_SITE_OTHER): Payer: Medicare Other | Admitting: Podiatry

## 2024-01-14 ENCOUNTER — Encounter: Payer: Self-pay | Admitting: Podiatry

## 2024-01-14 DIAGNOSIS — L600 Ingrowing nail: Secondary | ICD-10-CM

## 2024-01-14 NOTE — Progress Notes (Signed)
  Subjective:  Patient ID: Candelaria Celeste, female    DOB: [birth year],   MRN: [id number]  No chief complaint on file.   83 y.o. female presents for follow-up of ingrown nail procedure on left great toenail. She does relate improvement since last week. She was seen by Dr. Charlsie Merles post procedure and was concerned for infection and started on doxycycline.  . Denies any other pedal complaints. Denies n/v/f/c.   Past Medical History:  Diagnosis Date   Abdominal pain, unspecified site    ADVERSE REACTION TO MEDICATION 07/31/2009   ANEMIA-NOS 07/07/2007   Anxiety    Arthritis    Chest pain, unspecified    Chronic kidney disease    hx of kidney stone   COLONIC POLYPS, ADENOMATOUS, HX OF    Complication of anesthesia    Dementia (HCC)    Disturbance of skin sensation    DIVERTICULOSIS, COLON 02/23/2009   Dyspnea    Family history of diabetes mellitus    Family history of malignant neoplasm of breast    FATIGUE 05/25/2009   Fever, unspecified    Heart murmur    hx of   HYPERLIPIDEMIA 07/07/2007   HYPERPARATHYROIDISM UNSPECIFIED 10/20/2007   HYPERTENSION 02/04/2008   Incomplete right bundle branch block (RBBB) determined by electrocardiography 04/15/2023   Irritable bowel syndrome (IBS)    NEPHROLITHIASIS, HX OF 11/15/2008   OSTEOPOROSIS 01/01/2008   Other acquired absence of organ    parathyroidectomy   Pain in limb    PALPITATIONS, OCCASIONAL 07/12/2008   PARATHYROIDECTOMY 01/02/2009   PONV (postoperative nausea and vomiting)    SCOLIOSIS 05/25/2009   Seasonal allergies    Unspecified constipation    URINARY INCONTINENCE 07/07/2007   UTI'S, RECURRENT 11/20/2009   kimbrough     Objective:  Physical Exam: Vascular: DP/PT pulses 2/4 bilateral. CFT <3 seconds. Normal hair growth on digits. No edema.  Skin. No lacerations or abrasions bilateral feet. Left hallux nail healing well.  Musculoskeletal: MMT 5/5 bilateral lower extremities in DF, PF, Inversion and Eversion. Deceased  ROM in DF of ankle joint.  Neurological: Sensation intact to light touch.   Assessment:   1. Ingrown left greater toenail      Plan:  Patient was evaluated and treated and all questions answered. Toe was evaluated and appears to be healing well.  May discontinue soaks and neosporin.  Patient to follow-up as needed.    Louann Sjogren, DPM

## 2024-01-16 DIAGNOSIS — R278 Other lack of coordination: Secondary | ICD-10-CM | POA: Diagnosis not present

## 2024-01-16 DIAGNOSIS — R531 Weakness: Secondary | ICD-10-CM | POA: Diagnosis not present

## 2024-01-16 DIAGNOSIS — R2689 Other abnormalities of gait and mobility: Secondary | ICD-10-CM | POA: Diagnosis not present

## 2024-01-16 DIAGNOSIS — M79602 Pain in left arm: Secondary | ICD-10-CM | POA: Diagnosis not present

## 2024-01-16 DIAGNOSIS — M6259 Muscle wasting and atrophy, not elsewhere classified, multiple sites: Secondary | ICD-10-CM | POA: Diagnosis not present

## 2024-01-16 DIAGNOSIS — S52002D Unspecified fracture of upper end of left ulna, subsequent encounter for closed fracture with routine healing: Secondary | ICD-10-CM | POA: Diagnosis not present

## 2024-01-21 DIAGNOSIS — M79602 Pain in left arm: Secondary | ICD-10-CM | POA: Diagnosis not present

## 2024-01-21 DIAGNOSIS — R278 Other lack of coordination: Secondary | ICD-10-CM | POA: Diagnosis not present

## 2024-01-21 DIAGNOSIS — S43492D Other sprain of left shoulder joint, subsequent encounter: Secondary | ICD-10-CM | POA: Diagnosis not present

## 2024-01-21 DIAGNOSIS — S52022D Displaced fracture of olecranon process without intraarticular extension of left ulna, subsequent encounter for closed fracture with routine healing: Secondary | ICD-10-CM | POA: Diagnosis not present

## 2024-01-21 DIAGNOSIS — Z9181 History of falling: Secondary | ICD-10-CM | POA: Diagnosis not present

## 2024-01-21 DIAGNOSIS — R531 Weakness: Secondary | ICD-10-CM | POA: Diagnosis not present

## 2024-01-22 DIAGNOSIS — C44519 Basal cell carcinoma of skin of other part of trunk: Secondary | ICD-10-CM | POA: Diagnosis not present

## 2024-01-22 DIAGNOSIS — L820 Inflamed seborrheic keratosis: Secondary | ICD-10-CM | POA: Diagnosis not present

## 2024-01-23 DIAGNOSIS — S52022D Displaced fracture of olecranon process without intraarticular extension of left ulna, subsequent encounter for closed fracture with routine healing: Secondary | ICD-10-CM | POA: Diagnosis not present

## 2024-01-23 DIAGNOSIS — S43492D Other sprain of left shoulder joint, subsequent encounter: Secondary | ICD-10-CM | POA: Diagnosis not present

## 2024-01-23 DIAGNOSIS — Z9181 History of falling: Secondary | ICD-10-CM | POA: Diagnosis not present

## 2024-01-23 DIAGNOSIS — R531 Weakness: Secondary | ICD-10-CM | POA: Diagnosis not present

## 2024-01-23 DIAGNOSIS — R278 Other lack of coordination: Secondary | ICD-10-CM | POA: Diagnosis not present

## 2024-01-23 DIAGNOSIS — M79602 Pain in left arm: Secondary | ICD-10-CM | POA: Diagnosis not present

## 2024-01-26 DIAGNOSIS — S52022D Displaced fracture of olecranon process without intraarticular extension of left ulna, subsequent encounter for closed fracture with routine healing: Secondary | ICD-10-CM | POA: Diagnosis not present

## 2024-01-26 DIAGNOSIS — R278 Other lack of coordination: Secondary | ICD-10-CM | POA: Diagnosis not present

## 2024-01-26 DIAGNOSIS — S43492D Other sprain of left shoulder joint, subsequent encounter: Secondary | ICD-10-CM | POA: Diagnosis not present

## 2024-01-26 DIAGNOSIS — R531 Weakness: Secondary | ICD-10-CM | POA: Diagnosis not present

## 2024-01-26 DIAGNOSIS — M79602 Pain in left arm: Secondary | ICD-10-CM | POA: Diagnosis not present

## 2024-01-26 DIAGNOSIS — Z9181 History of falling: Secondary | ICD-10-CM | POA: Diagnosis not present

## 2024-01-28 DIAGNOSIS — S52022D Displaced fracture of olecranon process without intraarticular extension of left ulna, subsequent encounter for closed fracture with routine healing: Secondary | ICD-10-CM | POA: Diagnosis not present

## 2024-01-28 DIAGNOSIS — S43492D Other sprain of left shoulder joint, subsequent encounter: Secondary | ICD-10-CM | POA: Diagnosis not present

## 2024-01-28 DIAGNOSIS — Z9181 History of falling: Secondary | ICD-10-CM | POA: Diagnosis not present

## 2024-01-28 DIAGNOSIS — R531 Weakness: Secondary | ICD-10-CM | POA: Diagnosis not present

## 2024-01-28 DIAGNOSIS — M79602 Pain in left arm: Secondary | ICD-10-CM | POA: Diagnosis not present

## 2024-01-28 DIAGNOSIS — R278 Other lack of coordination: Secondary | ICD-10-CM | POA: Diagnosis not present

## 2024-01-30 DIAGNOSIS — Z9181 History of falling: Secondary | ICD-10-CM | POA: Diagnosis not present

## 2024-01-30 DIAGNOSIS — M79602 Pain in left arm: Secondary | ICD-10-CM | POA: Diagnosis not present

## 2024-01-30 DIAGNOSIS — R531 Weakness: Secondary | ICD-10-CM | POA: Diagnosis not present

## 2024-01-30 DIAGNOSIS — S52022D Displaced fracture of olecranon process without intraarticular extension of left ulna, subsequent encounter for closed fracture with routine healing: Secondary | ICD-10-CM | POA: Diagnosis not present

## 2024-01-30 DIAGNOSIS — R278 Other lack of coordination: Secondary | ICD-10-CM | POA: Diagnosis not present

## 2024-01-30 DIAGNOSIS — S43492D Other sprain of left shoulder joint, subsequent encounter: Secondary | ICD-10-CM | POA: Diagnosis not present

## 2024-02-02 DIAGNOSIS — S52022D Displaced fracture of olecranon process without intraarticular extension of left ulna, subsequent encounter for closed fracture with routine healing: Secondary | ICD-10-CM | POA: Diagnosis not present

## 2024-02-02 DIAGNOSIS — R278 Other lack of coordination: Secondary | ICD-10-CM | POA: Diagnosis not present

## 2024-02-02 DIAGNOSIS — R531 Weakness: Secondary | ICD-10-CM | POA: Diagnosis not present

## 2024-02-02 DIAGNOSIS — S43492D Other sprain of left shoulder joint, subsequent encounter: Secondary | ICD-10-CM | POA: Diagnosis not present

## 2024-02-02 DIAGNOSIS — Z9181 History of falling: Secondary | ICD-10-CM | POA: Diagnosis not present

## 2024-02-02 DIAGNOSIS — M79602 Pain in left arm: Secondary | ICD-10-CM | POA: Diagnosis not present

## 2024-02-04 DIAGNOSIS — M79602 Pain in left arm: Secondary | ICD-10-CM | POA: Diagnosis not present

## 2024-02-04 DIAGNOSIS — R278 Other lack of coordination: Secondary | ICD-10-CM | POA: Diagnosis not present

## 2024-02-04 DIAGNOSIS — S52022D Displaced fracture of olecranon process without intraarticular extension of left ulna, subsequent encounter for closed fracture with routine healing: Secondary | ICD-10-CM | POA: Diagnosis not present

## 2024-02-04 DIAGNOSIS — R531 Weakness: Secondary | ICD-10-CM | POA: Diagnosis not present

## 2024-02-04 DIAGNOSIS — S43492D Other sprain of left shoulder joint, subsequent encounter: Secondary | ICD-10-CM | POA: Diagnosis not present

## 2024-02-04 DIAGNOSIS — Z9181 History of falling: Secondary | ICD-10-CM | POA: Diagnosis not present

## 2024-02-09 DIAGNOSIS — H52203 Unspecified astigmatism, bilateral: Secondary | ICD-10-CM | POA: Diagnosis not present

## 2024-02-09 DIAGNOSIS — M79602 Pain in left arm: Secondary | ICD-10-CM | POA: Diagnosis not present

## 2024-02-09 DIAGNOSIS — R278 Other lack of coordination: Secondary | ICD-10-CM | POA: Diagnosis not present

## 2024-02-09 DIAGNOSIS — R531 Weakness: Secondary | ICD-10-CM | POA: Diagnosis not present

## 2024-02-09 DIAGNOSIS — Z961 Presence of intraocular lens: Secondary | ICD-10-CM | POA: Diagnosis not present

## 2024-02-09 DIAGNOSIS — S43492D Other sprain of left shoulder joint, subsequent encounter: Secondary | ICD-10-CM | POA: Diagnosis not present

## 2024-02-09 DIAGNOSIS — Z9181 History of falling: Secondary | ICD-10-CM | POA: Diagnosis not present

## 2024-02-09 DIAGNOSIS — S52022D Displaced fracture of olecranon process without intraarticular extension of left ulna, subsequent encounter for closed fracture with routine healing: Secondary | ICD-10-CM | POA: Diagnosis not present

## 2024-02-09 DIAGNOSIS — H02403 Unspecified ptosis of bilateral eyelids: Secondary | ICD-10-CM | POA: Diagnosis not present

## 2024-02-11 DIAGNOSIS — R531 Weakness: Secondary | ICD-10-CM | POA: Diagnosis not present

## 2024-02-11 DIAGNOSIS — S43492D Other sprain of left shoulder joint, subsequent encounter: Secondary | ICD-10-CM | POA: Diagnosis not present

## 2024-02-11 DIAGNOSIS — S52022D Displaced fracture of olecranon process without intraarticular extension of left ulna, subsequent encounter for closed fracture with routine healing: Secondary | ICD-10-CM | POA: Diagnosis not present

## 2024-02-11 DIAGNOSIS — R278 Other lack of coordination: Secondary | ICD-10-CM | POA: Diagnosis not present

## 2024-02-11 DIAGNOSIS — Z9181 History of falling: Secondary | ICD-10-CM | POA: Diagnosis not present

## 2024-02-11 DIAGNOSIS — M79602 Pain in left arm: Secondary | ICD-10-CM | POA: Diagnosis not present

## 2024-02-15 DIAGNOSIS — R278 Other lack of coordination: Secondary | ICD-10-CM | POA: Diagnosis not present

## 2024-02-15 DIAGNOSIS — R531 Weakness: Secondary | ICD-10-CM | POA: Diagnosis not present

## 2024-02-15 DIAGNOSIS — Z9181 History of falling: Secondary | ICD-10-CM | POA: Diagnosis not present

## 2024-02-15 DIAGNOSIS — M79602 Pain in left arm: Secondary | ICD-10-CM | POA: Diagnosis not present

## 2024-02-15 DIAGNOSIS — S52022D Displaced fracture of olecranon process without intraarticular extension of left ulna, subsequent encounter for closed fracture with routine healing: Secondary | ICD-10-CM | POA: Diagnosis not present

## 2024-02-15 DIAGNOSIS — S43492D Other sprain of left shoulder joint, subsequent encounter: Secondary | ICD-10-CM | POA: Diagnosis not present

## 2024-02-23 DIAGNOSIS — M79602 Pain in left arm: Secondary | ICD-10-CM | POA: Diagnosis not present

## 2024-02-23 DIAGNOSIS — S52022D Displaced fracture of olecranon process without intraarticular extension of left ulna, subsequent encounter for closed fracture with routine healing: Secondary | ICD-10-CM | POA: Diagnosis not present

## 2024-02-23 DIAGNOSIS — R531 Weakness: Secondary | ICD-10-CM | POA: Diagnosis not present

## 2024-02-23 DIAGNOSIS — S43492D Other sprain of left shoulder joint, subsequent encounter: Secondary | ICD-10-CM | POA: Diagnosis not present

## 2024-02-23 DIAGNOSIS — Z9181 History of falling: Secondary | ICD-10-CM | POA: Diagnosis not present

## 2024-02-23 DIAGNOSIS — R278 Other lack of coordination: Secondary | ICD-10-CM | POA: Diagnosis not present

## 2024-02-25 DIAGNOSIS — S43492D Other sprain of left shoulder joint, subsequent encounter: Secondary | ICD-10-CM | POA: Diagnosis not present

## 2024-02-25 DIAGNOSIS — M79602 Pain in left arm: Secondary | ICD-10-CM | POA: Diagnosis not present

## 2024-02-25 DIAGNOSIS — R278 Other lack of coordination: Secondary | ICD-10-CM | POA: Diagnosis not present

## 2024-02-25 DIAGNOSIS — R531 Weakness: Secondary | ICD-10-CM | POA: Diagnosis not present

## 2024-02-25 DIAGNOSIS — S52022D Displaced fracture of olecranon process without intraarticular extension of left ulna, subsequent encounter for closed fracture with routine healing: Secondary | ICD-10-CM | POA: Diagnosis not present

## 2024-02-25 DIAGNOSIS — Z9181 History of falling: Secondary | ICD-10-CM | POA: Diagnosis not present

## 2024-02-27 DIAGNOSIS — Z9181 History of falling: Secondary | ICD-10-CM | POA: Diagnosis not present

## 2024-02-27 DIAGNOSIS — R278 Other lack of coordination: Secondary | ICD-10-CM | POA: Diagnosis not present

## 2024-02-27 DIAGNOSIS — S43492D Other sprain of left shoulder joint, subsequent encounter: Secondary | ICD-10-CM | POA: Diagnosis not present

## 2024-02-27 DIAGNOSIS — R531 Weakness: Secondary | ICD-10-CM | POA: Diagnosis not present

## 2024-02-27 DIAGNOSIS — S52022D Displaced fracture of olecranon process without intraarticular extension of left ulna, subsequent encounter for closed fracture with routine healing: Secondary | ICD-10-CM | POA: Diagnosis not present

## 2024-02-27 DIAGNOSIS — M79602 Pain in left arm: Secondary | ICD-10-CM | POA: Diagnosis not present

## 2024-03-01 DIAGNOSIS — R531 Weakness: Secondary | ICD-10-CM | POA: Diagnosis not present

## 2024-03-01 DIAGNOSIS — Z9181 History of falling: Secondary | ICD-10-CM | POA: Diagnosis not present

## 2024-03-01 DIAGNOSIS — S52022D Displaced fracture of olecranon process without intraarticular extension of left ulna, subsequent encounter for closed fracture with routine healing: Secondary | ICD-10-CM | POA: Diagnosis not present

## 2024-03-01 DIAGNOSIS — S43492D Other sprain of left shoulder joint, subsequent encounter: Secondary | ICD-10-CM | POA: Diagnosis not present

## 2024-03-01 DIAGNOSIS — M79602 Pain in left arm: Secondary | ICD-10-CM | POA: Diagnosis not present

## 2024-03-01 DIAGNOSIS — R278 Other lack of coordination: Secondary | ICD-10-CM | POA: Diagnosis not present

## 2024-03-04 DIAGNOSIS — R531 Weakness: Secondary | ICD-10-CM | POA: Diagnosis not present

## 2024-03-04 DIAGNOSIS — M79602 Pain in left arm: Secondary | ICD-10-CM | POA: Diagnosis not present

## 2024-03-04 DIAGNOSIS — Z9181 History of falling: Secondary | ICD-10-CM | POA: Diagnosis not present

## 2024-03-04 DIAGNOSIS — S52022D Displaced fracture of olecranon process without intraarticular extension of left ulna, subsequent encounter for closed fracture with routine healing: Secondary | ICD-10-CM | POA: Diagnosis not present

## 2024-03-04 DIAGNOSIS — R278 Other lack of coordination: Secondary | ICD-10-CM | POA: Diagnosis not present

## 2024-03-04 DIAGNOSIS — S43492D Other sprain of left shoulder joint, subsequent encounter: Secondary | ICD-10-CM | POA: Diagnosis not present

## 2024-03-08 DIAGNOSIS — R531 Weakness: Secondary | ICD-10-CM | POA: Diagnosis not present

## 2024-03-08 DIAGNOSIS — R278 Other lack of coordination: Secondary | ICD-10-CM | POA: Diagnosis not present

## 2024-03-08 DIAGNOSIS — M79602 Pain in left arm: Secondary | ICD-10-CM | POA: Diagnosis not present

## 2024-03-08 DIAGNOSIS — Z9181 History of falling: Secondary | ICD-10-CM | POA: Diagnosis not present

## 2024-03-08 DIAGNOSIS — S43492D Other sprain of left shoulder joint, subsequent encounter: Secondary | ICD-10-CM | POA: Diagnosis not present

## 2024-03-08 DIAGNOSIS — S52022D Displaced fracture of olecranon process without intraarticular extension of left ulna, subsequent encounter for closed fracture with routine healing: Secondary | ICD-10-CM | POA: Diagnosis not present

## 2024-03-10 ENCOUNTER — Encounter: Payer: Self-pay | Admitting: Internal Medicine

## 2024-03-10 DIAGNOSIS — M79602 Pain in left arm: Secondary | ICD-10-CM | POA: Diagnosis not present

## 2024-03-10 DIAGNOSIS — Z9181 History of falling: Secondary | ICD-10-CM | POA: Diagnosis not present

## 2024-03-10 DIAGNOSIS — R531 Weakness: Secondary | ICD-10-CM | POA: Diagnosis not present

## 2024-03-10 DIAGNOSIS — S52022D Displaced fracture of olecranon process without intraarticular extension of left ulna, subsequent encounter for closed fracture with routine healing: Secondary | ICD-10-CM | POA: Diagnosis not present

## 2024-03-10 DIAGNOSIS — R278 Other lack of coordination: Secondary | ICD-10-CM | POA: Diagnosis not present

## 2024-03-10 DIAGNOSIS — S43492D Other sprain of left shoulder joint, subsequent encounter: Secondary | ICD-10-CM | POA: Diagnosis not present

## 2024-03-12 DIAGNOSIS — S52022D Displaced fracture of olecranon process without intraarticular extension of left ulna, subsequent encounter for closed fracture with routine healing: Secondary | ICD-10-CM | POA: Diagnosis not present

## 2024-03-12 DIAGNOSIS — Z9181 History of falling: Secondary | ICD-10-CM | POA: Diagnosis not present

## 2024-03-12 DIAGNOSIS — R278 Other lack of coordination: Secondary | ICD-10-CM | POA: Diagnosis not present

## 2024-03-12 DIAGNOSIS — S43492D Other sprain of left shoulder joint, subsequent encounter: Secondary | ICD-10-CM | POA: Diagnosis not present

## 2024-03-12 DIAGNOSIS — M79602 Pain in left arm: Secondary | ICD-10-CM | POA: Diagnosis not present

## 2024-03-12 DIAGNOSIS — R531 Weakness: Secondary | ICD-10-CM | POA: Diagnosis not present

## 2024-03-15 DIAGNOSIS — R350 Frequency of micturition: Secondary | ICD-10-CM | POA: Diagnosis not present

## 2024-03-15 DIAGNOSIS — S43492D Other sprain of left shoulder joint, subsequent encounter: Secondary | ICD-10-CM | POA: Diagnosis not present

## 2024-03-15 DIAGNOSIS — R278 Other lack of coordination: Secondary | ICD-10-CM | POA: Diagnosis not present

## 2024-03-15 DIAGNOSIS — R3915 Urgency of urination: Secondary | ICD-10-CM | POA: Diagnosis not present

## 2024-03-15 DIAGNOSIS — N200 Calculus of kidney: Secondary | ICD-10-CM | POA: Diagnosis not present

## 2024-03-15 DIAGNOSIS — R531 Weakness: Secondary | ICD-10-CM | POA: Diagnosis not present

## 2024-03-15 DIAGNOSIS — M79602 Pain in left arm: Secondary | ICD-10-CM | POA: Diagnosis not present

## 2024-03-15 DIAGNOSIS — N3020 Other chronic cystitis without hematuria: Secondary | ICD-10-CM | POA: Diagnosis not present

## 2024-03-15 DIAGNOSIS — S52022D Displaced fracture of olecranon process without intraarticular extension of left ulna, subsequent encounter for closed fracture with routine healing: Secondary | ICD-10-CM | POA: Diagnosis not present

## 2024-03-15 DIAGNOSIS — Z9181 History of falling: Secondary | ICD-10-CM | POA: Diagnosis not present

## 2024-03-16 ENCOUNTER — Other Ambulatory Visit: Payer: Self-pay | Admitting: Internal Medicine

## 2024-03-17 ENCOUNTER — Ambulatory Visit: Payer: Medicare Other

## 2024-03-17 VITALS — Ht 60.0 in | Wt 118.0 lb

## 2024-03-17 DIAGNOSIS — R531 Weakness: Secondary | ICD-10-CM | POA: Diagnosis not present

## 2024-03-17 DIAGNOSIS — M79602 Pain in left arm: Secondary | ICD-10-CM | POA: Diagnosis not present

## 2024-03-17 DIAGNOSIS — Z0000 Encounter for general adult medical examination without abnormal findings: Secondary | ICD-10-CM | POA: Diagnosis not present

## 2024-03-17 DIAGNOSIS — S43492D Other sprain of left shoulder joint, subsequent encounter: Secondary | ICD-10-CM | POA: Diagnosis not present

## 2024-03-17 DIAGNOSIS — Z9181 History of falling: Secondary | ICD-10-CM | POA: Diagnosis not present

## 2024-03-17 DIAGNOSIS — R278 Other lack of coordination: Secondary | ICD-10-CM | POA: Diagnosis not present

## 2024-03-17 DIAGNOSIS — S52022D Displaced fracture of olecranon process without intraarticular extension of left ulna, subsequent encounter for closed fracture with routine healing: Secondary | ICD-10-CM | POA: Diagnosis not present

## 2024-03-17 NOTE — Progress Notes (Addendum)
 Subjective:   [name] is a 83 y.o. who presents for a Medicare Wellness preventive visit.  Visit Complete: Virtual I connected with  [name] on 03/17/24 by a audio enabled telemedicine application and verified that I am speaking with the correct person using two identifiers.  Patient Location: Home  Provider Location: Office/Clinic  I discussed the limitations of evaluation and management by telemedicine. The patient expressed understanding and agreed to proceed.  Vital Signs: Because this visit was a virtual/telehealth visit, some criteria may be missing or patient reported. Any vitals not documented were not able to be obtained and vitals that have been documented are patient reported.  VideoDeclined- This patient declined Librarian, academic. Therefore the visit was completed with audio only.  Persons Participating in Visit: Patient.  AWV Questionnaire: No: Patient Medicare AWV questionnaire was not completed prior to this visit.  Cardiac Risk Factors include: advanced age (>102men, >65 women);hypertension;dyslipidemia     Objective:    Today's Vitals   03/17/24 1132  Weight: 118 lb (53.5 kg)  Height: 5' (1.524 m)   Body mass index is 23.05 kg/m.     03/17/2024   11:29 AM 11/14/2023    9:21 AM 10/21/2023    7:35 AM 10/21/2023    7:20 AM 10/20/2023   10:06 AM 10/20/2023    8:27 AM 03/12/2023    2:15 PM  Advanced Directives  Does Patient Have a Medical Advance Directive? Yes Yes  Yes Yes Yes Yes  Type of Estate agent of [place];Living will Living will;Out of facility DNR (pink MOST or yellow form) Out of facility DNR (pink MOST or yellow form)  Living will;Out of facility DNR (pink MOST or yellow form) Healthcare Power of [place];Living will   Does patient want to make changes to medical advance directive? No - Patient declined No - Patient declined  No - Patient declined No - Patient declined  No - Patient  declined  Copy of Healthcare Power of Attorney in Chart? Yes - validated most recent copy scanned in chart (See row information)     Yes - validated most recent copy scanned in chart (See row information)     Current Medications (verified) Outpatient Encounter Medications as of 03/17/2024  Medication Sig   acetaminophen  (TYLENOL ) 500 MG tablet Take 2 tablets (1,000 mg total) by mouth 2 (two) times daily.   acetaminophen  (TYLENOL ) 500 MG tablet Take 2 tablets (1,000 mg total) by mouth daily as needed.   b complex vitamins capsule Take 1 capsule by mouth daily.   Cholecalciferol (VITAMIN D3) 125 MCG (5000 UT) CAPS Take 1 capsule (5,000 Units total) by mouth daily.   denosumab  (PROLIA ) 60 MG/ML SOSY injection Inject 60 mg into the skin every 6 (six) months.   LORazepam  (ATIVAN ) 0.5 MG tablet TAKE ONE TABLET BY MOUTH TWICE DAILY AS NEEDED FOR ANXIETY   olmesartan  (BENICAR ) 20 MG tablet Take 1 tablet (20 mg total) by mouth 2 (two) times daily.   ondansetron  (ZOFRAN -ODT) 4 MG disintegrating tablet Take 4 mg by mouth every 8 (eight) hours as needed for nausea or vomiting.   oxycodone  (OXY-IR) 5 MG capsule Take 2.5 mg by mouth every 4 (four) hours as needed for pain.   polyethylene glycol (MIRALAX  / GLYCOLAX ) 17 g packet Take 17 g by mouth daily as needed for mild constipation.   polyethylene glycol powder (GLYCOLAX /MIRALAX ) 17 GM/SCOOP powder Take 17 g by mouth daily.   senna (SENOKOT) 8.6 MG  TABS tablet Take 2 tablets (17.2 mg total) by mouth at bedtime as needed for mild constipation. (Patient taking differently: Take 2 tablets by mouth at bedtime.)   traZODone  (DESYREL ) 50 MG tablet Take 25 mg by mouth at bedtime.   [DISCONTINUED] doxycycline  (VIBRA -TABS) 100 MG tablet Take 1 tablet (100 mg total) by mouth 2 (two) times daily.   [DISCONTINUED] doxycycline  (VIBRA -TABS) 100 MG tablet Take 1 tablet (100 mg total) by mouth 2 (two) times daily.   amLODipine  (NORVASC ) 5 MG tablet Take 1 tablet (5 mg  total) by mouth daily. (Patient taking differently: Take 5 mg by mouth every evening.)   hydrALAZINE  (APRESOLINE ) 10 MG tablet Take by mouth. (Patient not taking: Reported on 03/17/2024)   No facility-administered encounter medications on file as of 03/17/2024.    Allergies (verified) Anesthetics, amide and Lisinopril   History: Past Medical History:  Diagnosis Date   Abdominal pain, unspecified site    ADVERSE REACTION TO MEDICATION 07/31/2009   ANEMIA-NOS 07/07/2007   Anxiety    Arthritis    Chest pain, unspecified    Chronic kidney disease    hx of kidney stone   COLONIC POLYPS, ADENOMATOUS, HX OF    Complication of anesthesia    Dementia (HCC)    Disturbance of skin sensation    DIVERTICULOSIS, COLON 02/23/2009   Dyspnea    Family history of diabetes mellitus    Family history of malignant neoplasm of breast    FATIGUE 05/25/2009   Fever, unspecified    Heart murmur    hx of   HYPERLIPIDEMIA 07/07/2007   HYPERPARATHYROIDISM UNSPECIFIED 10/20/2007   HYPERTENSION 02/04/2008   Incomplete right bundle branch block (RBBB) determined by electrocardiography 04/15/2023   Irritable bowel syndrome (IBS)    NEPHROLITHIASIS, HX OF 11/15/2008   OSTEOPOROSIS 01/01/2008   Other acquired absence of organ    parathyroidectomy   Pain in limb    PALPITATIONS, OCCASIONAL 07/12/2008   PARATHYROIDECTOMY 01/02/2009   PONV (postoperative nausea and vomiting)    SCOLIOSIS 05/25/2009   Seasonal allergies    Unspecified constipation    URINARY INCONTINENCE 07/07/2007   UTI'S, RECURRENT 11/20/2009   kimbrough    Past Surgical History:  Procedure Laterality Date   BUNIONECTOMY     CATARACT EXTRACTION, BILATERAL     COLONOSCOPY     EXTRACORPOREAL SHOCK WAVE LITHOTRIPSY Left 12/21/2020   Procedure: EXTRACORPOREAL SHOCK WAVE LITHOTRIPSY (ESWL);  Surgeon: Christina Coyer, MD;  Location: [facility];  Service: Urology;  Laterality: Left;   LAPAROSCOPIC APPENDECTOMY  01/19/2016    Procedure: APPENDECTOMY LAPAROSCOPIC with orifice of cecal polyp;  Surgeon: Joyce Nixon, MD;  Location: [facility];  Service: General;;   LEFT HEART CATH AND CORONARY ANGIOGRAPHY N/A 04/07/2017   Procedure: Left Heart Cath and Coronary Angiography;  Surgeon: Sammy Crisp, MD;  Location: [facility];  Service: Cardiovascular;  Laterality: N/A;   OLECRANON BURSECTOMY Left 10/21/2023   Procedure: OLECRANON (ELBOW) BURSA;  Surgeon: Saundra Curl, MD;  Location: [facility];  Service: Orthopedics;  Laterality: Left;   ORIF ELBOW FRACTURE Left 10/21/2023   Procedure: Open Reduction Internal fixation (ORIF), fracture, elbow/olecranon;  Surgeon: Saundra Curl, MD;  Location: [facility];  Service: Orthopedics;  Laterality: Left;   PARATHYROIDECTOMY     Rt Superior open neck exploration   POLYPECTOMY     RIGHT OOPHORECTOMY     TONSILLECTOMY     Family History  Problem Relation Age of Onset   Heart disease Mother  valve replacement   Sudden death Father 64   Heart disease Father 10       MI   Breast cancer Paternal Aunt    Diabetes Other        1st degree relative   Colon cancer Neg Hx    Stomach cancer Neg Hx    Esophageal cancer Neg Hx    Pancreatic cancer Neg Hx    Social History   Socioeconomic History   Marital status: Widowed    Spouse name: Not on file   Number of children: 3   Years of education: Not on file   Highest education level: Not on file  Occupational History   Occupation: travel agent    Employer: RETIRED  Tobacco Use   Smoking status: Never    Passive exposure: Never   Smokeless tobacco: Never  Vaping Use   Vaping status: Never Used  Substance and Sexual Activity   Alcohol use: Yes    Comment: 1 glass of wine per day   Drug use: No   Sexual activity: Never  Other Topics Concern   Not on file  Social History Narrative       2-3 c coffee in am    ocass wine    G3P2vaginal delivery   Pt cell [phone]      Patient walks daily for exercise.     Social Drivers of Corporate investment banker Strain: Low Risk  (03/17/2024)   Overall Financial Resource Strain (CARDIA)    Difficulty of Paying Living Expenses: Not hard at all  Food Insecurity: No Food Insecurity (03/17/2024)   Hunger Vital Sign    Worried About Running Out of Food in the Last Year: Never true    Ran Out of Food in the Last Year: Never true  Transportation Needs: No Transportation Needs (03/17/2024)   PRAPARE - Administrator, Civil Service (Medical): No    Lack of Transportation (Non-Medical): No  Physical Activity: Insufficiently Active (03/17/2024)   Exercise Vital Sign    Days of Exercise per Week: 3 days    Minutes of Exercise per Session: 20 min  Stress: No Stress Concern Present (03/17/2024)   [employer] of Occupational Health - Occupational Stress Questionnaire    Feeling of Stress : Not at all  Social Connections: Moderately Integrated (03/17/2024)   Social Connection and Isolation Panel [NHANES]    Frequency of Communication with Friends and Family: More than three times a week    Frequency of Social Gatherings with Friends and Family: More than three times a week    Attends Religious Services: More than 4 times per year    Active Member of [employer] or Organizations: Yes    Attends Banker Meetings: More than 4 times per year    Marital Status: Widowed    Tobacco Counseling Counseling given: No    Clinical Intake:  Pre-visit preparation completed: Yes  Pain : No/denies pain     BMI - recorded: 23.05 Nutritional Risks: None Diabetes: No  No results found for: "HGBA1C"   How often do you need to have someone help you when you read instructions, pamphlets, or other written materials from your doctor or pharmacy?: 1 - Never  Interpreter Needed?: No  Information entered by :: Kandy Orris, CMA   Activities of Daily Living     03/17/2024   11:46 AM 10/20/2023    8:27 AM  In your present state of health, do  you  have any difficulty performing the following activities:  Hearing? 0   Vision? 0   Difficulty concentrating or making decisions? 0 1  Comment  Dementia  Walking or climbing stairs? 0   Dressing or bathing? 0   Doing errands, shopping? 0   Preparing Food and eating ? N   Using the Toilet? N   In the past six months, have you accidently leaked urine? Y   Comment wears a pantyliner sometimes   Do you have problems with loss of bowel control? N   Managing your Medications? N   Managing your Finances? N   Housekeeping or managing your Housekeeping? N     Patient Care Team: Plotnikov, Oakley Bellman, MD as PCP - General (Internal Medicine) Sheryle Donning, MD as PCP - Cardiology (Cardiology) [place], Amy, MD (Dermatology) [place], [place] Lurlene Salon., MD (Urology) Joyce Nixon, MD as Consulting Physician (General Surgery) Nandigam, Kavitha V, MD as Consulting Physician (Gastroenterology) Shermon Divine, MD as Consulting Physician (Sports Medicine) Dema Filler, MD as Consulting Physician (Ophthalmology)  Indicate any recent Medical Services you may have received from other than [facility] providers in the past year (date may be approximate).     Assessment:   This is a routine wellness examination for [name].  Hearing/Vision screen Hearing Screening - Comments:: Denies hearing difficulties   Vision Screening - Comments:: Wears rx glasses - up to date with routine eye exams with Dr Juanito Norma   Goals Addressed               This Visit's Progress     Patient Stated (pt-stated)        Patient stated she wants to be healthy and stronger       Depression Screen     03/17/2024   11:38 AM 12/23/2023    3:00 PM 12/09/2023    1:39 PM 10/14/2023    1:46 PM 08/04/2023    3:04 PM 04/23/2023   11:26 AM 03/31/2023   11:10 AM  PHQ 2/9 Scores  PHQ - 2 Score 0 0 0 0 0 0 0  PHQ- 9 Score 1     0 0    Fall Risk     03/17/2024   11:34 AM 12/23/2023    3:00 PM 10/14/2023    1:46 PM  08/04/2023    3:04 PM 04/23/2023   11:26 AM  Fall Risk   Falls in the past year? 1 1 1  0 0  Number falls in past yr: 1 1 0 0 0  Comment 2      Injury with Fall? 1 1 1  0 0  Comment fractured left elbow      Risk for fall due to : History of fall(s) Impaired balance/gait;History of fall(s) History of fall(s);Impaired balance/gait No Fall Risks No Fall Risks  Follow up Falls evaluation completed;Falls prevention discussed Falls evaluation completed Falls evaluation completed;Education provided;Falls prevention discussed Falls evaluation completed Falls evaluation completed    MEDICARE RISK AT HOME:  Medicare Risk at Home Any stairs in or around the home?: Yes If so, are there any without handrails?: No Home free of loose throw rugs in walkways, pet beds, electrical cords, etc?: Yes Adequate lighting in your home to reduce risk of falls?: Yes Life alert?: Yes Use of a cane, walker or w/c?: Yes (walker) Grab bars in the bathroom?: Yes Shower chair or bench in shower?: Yes Elevated toilet seat or a handicapped toilet?: Yes  TIMED UP AND GO:  Was the test performed?  No  Cognitive Function: 6CIT completed        03/17/2024   11:40 AM 03/17/2023   11:43 AM  6CIT Screen  What Year? 0 points 0 points  What month? 0 points 0 points  What time? 0 points 0 points  Count back from 20 0 points 0 points  Months in reverse 0 points 0 points  Repeat phrase 0 points 0 points  Total Score 0 points 0 points    Immunizations Immunization History  Administered Date(s) Administered   Fluad Trivalent(High Dose 65+) 08/04/2023   Influenza Split 09/17/2011, 11/08/2015, 09/12/2017, 08/02/2020   Influenza Whole 08/19/2008   Influenza, High Dose Seasonal PF 08/20/2016, 09/12/2017, 08/21/2018, 09/28/2019, 10/12/2021, 09/01/2022   Influenza-Unspecified 09/18/2013, 08/18/2014, 11/08/2015, 09/15/2020   Moderna SARS-COV2 Booster Vaccination 07/03/2021   Moderna Sars-Covid-2 Vaccination 11/30/2019,  12/31/2019, 10/26/2020, 07/03/2021, 04/22/2022   Pneumococcal Conjugate-13 12/29/2013   Pneumococcal Polysaccharide-23 11/08/2015   Tdap 01/05/2015   Zoster Recombinant(Shingrix) 06/20/2021, 09/05/2021    Screening Tests Health Maintenance  Topic Date Due   COVID-19 Vaccine (7 - 2024-25 season) 07/20/2023   INFLUENZA VACCINE  06/18/2024   DTaP/Tdap/Td (2 - Td or Tdap) 01/05/2025   Medicare Annual Wellness (AWV)  03/17/2025   Pneumonia Vaccine 82+ Years old  Completed   DEXA SCAN  Completed   Zoster Vaccines- Shingrix  Completed   HPV VACCINES  Aged Out   Meningococcal B Vaccine  Aged Out   Colonoscopy  Discontinued    Health Maintenance  Health Maintenance Due  Topic Date Due   COVID-19 Vaccine (7 - 2024-25 season) 07/20/2023   Health Maintenance Items Addressed:03/17/2024   Additional Screening:  Vision Screening: Recommended annual ophthalmology exams for early detection of glaucoma and other disorders of the eye.  Dental Screening: Recommended annual dental exams for proper oral hygiene  Community Resource Referral / Chronic Care Management: CRR required this visit?  No   CCM required this visit?  No     Plan:     I have personally reviewed and noted the following in the patient's chart:   Medical and social history Use of alcohol, tobacco or illicit drugs  Current medications and supplements including opioid prescriptions. Patient is not currently taking opioid prescriptions. Functional ability and status Nutritional status Physical activity Advanced directives List of other physicians Hospitalizations, surgeries, and ER visits in previous 12 months Vitals Screenings to include cognitive, depression, and falls Referrals and appointments  In addition, I have reviewed and discussed with patient certain preventive protocols, quality metrics, and best practice recommendations. A written personalized care plan for preventive services as well as general  preventive health recommendations were provided to patient.     Patria Bookbinder, CMA   03/17/2024   After Visit Summary: (MyChart) Due to this being a telephonic visit, the after visit summary with patients personalized plan was offered to patient via MyChart   Notes: Nothing significant to report at this time.  Medical screening examination/treatment/procedure(s) were performed by non-physician practitioner and as supervising physician I was immediately available for consultation/collaboration.  I agree with above. Adelaide Holy, MD

## 2024-03-17 NOTE — Patient Instructions (Addendum)
 [name] , Thank you for taking time to come for your Medicare Wellness Visit. I appreciate your ongoing commitment to your health goals. Please review the following plan we discussed and let me know if I can assist you in the future.   Referrals/Orders/Follow-Ups/Clinician Recommendations: Aim for 30 minutes of exercise or brisk walking, 6-8 glasses of water, and 5 servings of fruits and vegetables each day. Educated and advised on the COVID vaccines.    This is a list of the screening recommended for you and due dates:  Health Maintenance  Topic Date Due   COVID-19 Vaccine (7 - 2024-25 season) 07/20/2023   Flu Shot  06/18/2024   DTaP/Tdap/Td vaccine (2 - Td or Tdap) 01/05/2025   Medicare Annual Wellness Visit  03/17/2025   Pneumonia Vaccine  Completed   DEXA scan (bone density measurement)  Completed   Zoster (Shingles) Vaccine  Completed   HPV Vaccine  Aged Out   Meningitis B Vaccine  Aged Out   Colon Cancer Screening  Discontinued    Advanced directives: (In Chart) A copy of your advanced directives are scanned into your chart should your provider ever need it.  Next Medicare Annual Wellness Visit scheduled for next year: Yes

## 2024-03-19 DIAGNOSIS — S52022D Displaced fracture of olecranon process without intraarticular extension of left ulna, subsequent encounter for closed fracture with routine healing: Secondary | ICD-10-CM | POA: Diagnosis not present

## 2024-03-19 DIAGNOSIS — R531 Weakness: Secondary | ICD-10-CM | POA: Diagnosis not present

## 2024-03-19 DIAGNOSIS — R278 Other lack of coordination: Secondary | ICD-10-CM | POA: Diagnosis not present

## 2024-03-19 DIAGNOSIS — M545 Low back pain, unspecified: Secondary | ICD-10-CM | POA: Diagnosis not present

## 2024-03-19 DIAGNOSIS — R41841 Cognitive communication deficit: Secondary | ICD-10-CM | POA: Diagnosis not present

## 2024-03-19 DIAGNOSIS — R2681 Unsteadiness on feet: Secondary | ICD-10-CM | POA: Diagnosis not present

## 2024-03-19 DIAGNOSIS — Z9181 History of falling: Secondary | ICD-10-CM | POA: Diagnosis not present

## 2024-03-19 DIAGNOSIS — M79602 Pain in left arm: Secondary | ICD-10-CM | POA: Diagnosis not present

## 2024-03-19 DIAGNOSIS — F02B0 Dementia in other diseases classified elsewhere, moderate, without behavioral disturbance, psychotic disturbance, mood disturbance, and anxiety: Secondary | ICD-10-CM | POA: Diagnosis not present

## 2024-03-19 DIAGNOSIS — M810 Age-related osteoporosis without current pathological fracture: Secondary | ICD-10-CM | POA: Diagnosis not present

## 2024-03-19 DIAGNOSIS — G301 Alzheimer's disease with late onset: Secondary | ICD-10-CM | POA: Diagnosis not present

## 2024-03-19 DIAGNOSIS — M4854XA Collapsed vertebra, not elsewhere classified, thoracic region, initial encounter for fracture: Secondary | ICD-10-CM | POA: Diagnosis not present

## 2024-03-19 DIAGNOSIS — S43492D Other sprain of left shoulder joint, subsequent encounter: Secondary | ICD-10-CM | POA: Diagnosis not present

## 2024-03-21 ENCOUNTER — Other Ambulatory Visit: Payer: Self-pay | Admitting: Internal Medicine

## 2024-03-21 DIAGNOSIS — R270 Ataxia, unspecified: Secondary | ICD-10-CM

## 2024-03-21 DIAGNOSIS — M545 Low back pain, unspecified: Secondary | ICD-10-CM

## 2024-03-22 ENCOUNTER — Telehealth: Payer: Self-pay | Admitting: Internal Medicine

## 2024-03-22 DIAGNOSIS — R531 Weakness: Secondary | ICD-10-CM | POA: Diagnosis not present

## 2024-03-22 DIAGNOSIS — M545 Low back pain, unspecified: Secondary | ICD-10-CM | POA: Diagnosis not present

## 2024-03-22 DIAGNOSIS — R2681 Unsteadiness on feet: Secondary | ICD-10-CM | POA: Diagnosis not present

## 2024-03-22 DIAGNOSIS — G301 Alzheimer's disease with late onset: Secondary | ICD-10-CM | POA: Diagnosis not present

## 2024-03-22 DIAGNOSIS — R41841 Cognitive communication deficit: Secondary | ICD-10-CM | POA: Diagnosis not present

## 2024-03-22 DIAGNOSIS — M79602 Pain in left arm: Secondary | ICD-10-CM | POA: Diagnosis not present

## 2024-03-22 NOTE — Telephone Encounter (Signed)
 Copied from CRM [id number]. Topic: General - Other >> Mar 22, 2024  1:46 PM Jethro Morrison wrote: Reason for CRM: [id number] [facility] WITH [employer] [employer] [facility] IN REGARDS TO [facility] ([phone]) ORDERS IS CALLING ABOUT ORDERS THAT WERE FAXED ABOUT A WEEK AGO FOR PT AND SPEECH

## 2024-03-23 ENCOUNTER — Encounter: Payer: Self-pay | Admitting: Internal Medicine

## 2024-03-23 ENCOUNTER — Ambulatory Visit (INDEPENDENT_AMBULATORY_CARE_PROVIDER_SITE_OTHER): Payer: Medicare Other | Admitting: Internal Medicine

## 2024-03-23 VITALS — BP 150/80 | HR 67 | Ht 60.0 in | Wt 116.0 lb

## 2024-03-23 DIAGNOSIS — E785 Hyperlipidemia, unspecified: Secondary | ICD-10-CM | POA: Diagnosis not present

## 2024-03-23 DIAGNOSIS — I2510 Atherosclerotic heart disease of native coronary artery without angina pectoris: Secondary | ICD-10-CM | POA: Diagnosis not present

## 2024-03-23 DIAGNOSIS — R0989 Other specified symptoms and signs involving the circulatory and respiratory systems: Secondary | ICD-10-CM

## 2024-03-23 DIAGNOSIS — I5189 Other ill-defined heart diseases: Secondary | ICD-10-CM

## 2024-03-23 DIAGNOSIS — F411 Generalized anxiety disorder: Secondary | ICD-10-CM

## 2024-03-23 DIAGNOSIS — I10 Essential (primary) hypertension: Secondary | ICD-10-CM | POA: Diagnosis not present

## 2024-03-23 DIAGNOSIS — M810 Age-related osteoporosis without current pathological fracture: Secondary | ICD-10-CM

## 2024-03-23 MED ORDER — LORAZEPAM 0.5 MG PO TABS: 0.5000 mg | ORAL_TABLET | Freq: Two times a day (BID) | ORAL | 5 refills | Status: DC | PRN

## 2024-03-23 MED ORDER — OLMESARTAN MEDOXOMIL 20 MG PO TABS: 20.0000 mg | ORAL_TABLET | Freq: Two times a day (BID) | ORAL | 3 refills | Status: DC

## 2024-03-23 MED ORDER — AMLODIPINE BESYLATE 5 MG PO TABS: 5.0000 mg | ORAL_TABLET | Freq: Every day | ORAL | 11 refills | Status: DC

## 2024-03-23 NOTE — Assessment & Plan Note (Signed)
-

## 2024-03-23 NOTE — Assessment & Plan Note (Addendum)
 Labile BP Nl BP now  Lorazepam  helps a lot and is normalizing her BP. Ok to use BID

## 2024-03-23 NOTE — Assessment & Plan Note (Signed)
 Lorazepam  helps a lot and is normalizing her BP. Ok to use BID

## 2024-03-23 NOTE — Assessment & Plan Note (Addendum)
 On Vit D, Prolia  Pt would like to stop Prolia 

## 2024-03-23 NOTE — Progress Notes (Signed)
 Subjective:  Patient ID: [name], female    DOB: 1941/08/21  Age: 83 y.o. MRN: [id number]  CC: Medical Management of Chronic Issues (3 Month follow up. Patient notes she still has very high blood pressure readings, and notes these fluctuate at times to where systolic is ranging 190 back down to 100)   HPI [name] presents for HTN, OA, unsteady gait C/o anxiety - Lorazepam  helps a lot and is normalizing her BP  Outpatient Medications Prior to Visit  Medication Sig Dispense Refill   acetaminophen  (TYLENOL ) 500 MG tablet Take 2 tablets (1,000 mg total) by mouth 2 (two) times daily.     acetaminophen  (TYLENOL ) 500 MG tablet Take 2 tablets (1,000 mg total) by mouth daily as needed.     b complex vitamins capsule Take 1 capsule by mouth daily.     Cholecalciferol (VITAMIN D3) 125 MCG (5000 UT) CAPS Take 1 capsule (5,000 Units total) by mouth daily.     denosumab  (PROLIA ) 60 MG/ML SOSY injection Inject 60 mg into the skin every 6 (six) months.     ondansetron  (ZOFRAN -ODT) 4 MG disintegrating tablet Take 4 mg by mouth every 8 (eight) hours as needed for nausea or vomiting.     oxycodone  (OXY-IR) 5 MG capsule Take 2.5 mg by mouth every 4 (four) hours as needed for pain.     polyethylene glycol (MIRALAX  / GLYCOLAX ) 17 g packet Take 17 g by mouth daily as needed for mild constipation.     polyethylene glycol powder (GLYCOLAX /MIRALAX ) 17 GM/SCOOP powder Take 17 g by mouth daily.     senna (SENOKOT) 8.6 MG TABS tablet Take 2 tablets (17.2 mg total) by mouth at bedtime as needed for mild constipation. (Patient taking differently: Take 2 tablets by mouth at bedtime.)     traZODone  (DESYREL ) 50 MG tablet Take 25 mg by mouth at bedtime.     LORazepam  (ATIVAN ) 0.5 MG tablet TAKE ONE TABLET BY MOUTH TWICE DAILY AS NEEDED FOR ANXIETY 30 tablet 3   olmesartan  (BENICAR ) 20 MG tablet Take 1 tablet (20 mg total) by mouth 2 (two) times daily. 180 tablet 3   amLODipine  (NORVASC ) 5 MG tablet Take 1  tablet (5 mg total) by mouth daily. (Patient taking differently: Take 5 mg by mouth every evening.)     hydrALAZINE  (APRESOLINE ) 10 MG tablet Take by mouth. (Patient not taking: Reported on 03/23/2024)     No facility-administered medications prior to visit.    ROS: Review of Systems  Constitutional:  Positive for fatigue. Negative for activity change, appetite change, chills and unexpected weight change.  HENT:  Negative for congestion, mouth sores and sinus pressure.   Eyes:  Negative for visual disturbance.  Respiratory:  Negative for cough and chest tightness.   Gastrointestinal:  Negative for abdominal pain and nausea.  Genitourinary:  Negative for difficulty urinating, frequency and vaginal pain.  Musculoskeletal:  Positive for arthralgias, back pain and gait problem.  Skin:  Negative for pallor and rash.  Neurological:  Negative for dizziness, tremors, weakness, numbness and headaches.  Psychiatric/Behavioral:  Negative for confusion, sleep disturbance and suicidal ideas. The patient is nervous/anxious.     Objective:  BP (!) 150/80   Pulse 67   Ht 5' (1.524 m)   Wt 116 lb (52.6 kg)   SpO2 98%   BMI 22.65 kg/m   BP Readings from Last 3 Encounters:  03/23/24 (!) 150/80  12/23/23 110/60  12/19/23 128/76    Wt Readings  from Last 3 Encounters:  03/23/24 116 lb (52.6 kg)  03/17/24 118 lb (53.5 kg)  12/23/23 118 lb (53.5 kg)    Physical Exam Constitutional:      General: She is not in acute distress.    Appearance: Normal appearance. She is well-developed.  HENT:     Head: Normocephalic.     Right Ear: External ear normal.     Left Ear: External ear normal.     Nose: Nose normal.  Eyes:     General:        Right eye: No discharge.        Left eye: No discharge.     Conjunctiva/sclera: Conjunctivae normal.     Pupils: Pupils are equal, round, and reactive to light.  Neck:     Thyroid : No thyromegaly.     Vascular: No JVD.     Trachea: No tracheal deviation.   Cardiovascular:     Rate and Rhythm: Normal rate and regular rhythm.     Heart sounds: Normal heart sounds.  Pulmonary:     Effort: No respiratory distress.     Breath sounds: No stridor. No wheezing.  Abdominal:     General: Bowel sounds are normal. There is no distension.     Palpations: Abdomen is soft. There is no mass.     Tenderness: There is no abdominal tenderness. There is no guarding or rebound.  Musculoskeletal:        General: No tenderness.     Cervical back: Normal range of motion and neck supple. No rigidity.     Left lower leg: No edema.  Lymphadenopathy:     Cervical: No cervical adenopathy.  Skin:    Findings: No erythema or rash.  Neurological:     Mental Status: Mental status is at baseline.     Cranial Nerves: No cranial nerve deficit.     Motor: No abnormal muscle tone.     Coordination: Coordination abnormal.     Gait: Gait abnormal.     Deep Tendon Reflexes: Reflexes normal.  Psychiatric:        Behavior: Behavior normal.        Thought Content: Thought content normal.        Judgment: Judgment normal.   ataxic  Lab Results  Component Value Date   WBC 6.2 12/23/2023   HGB 13.6 12/23/2023   HCT 41.0 12/23/2023   PLT 217.0 12/23/2023   GLUCOSE 99 12/23/2023   CHOL 132 01/25/2015   TRIG 64 01/25/2015   HDL 68 01/25/2015   LDLDIRECT 156.3 03/28/2011   LDLCALC 51 01/25/2015   ALT 15 12/23/2023   AST 18 12/23/2023   NA 140 12/23/2023   K 4.1 12/23/2023   CL 105 12/23/2023   CREATININE 0.77 12/23/2023   BUN 19 12/23/2023   CO2 27 12/23/2023   TSH 1.26 12/23/2023   INR 1.0 04/01/2017    No results found.  Assessment & Plan:   Problem List Items Addressed This Visit     Dyslipidemia   Essential hypertension - Primary (Chronic)   Labile BP Nl BP now  Lorazepam  helps a lot and is normalizing her BP. Ok to use BID      Relevant Medications   amLODipine  (NORVASC ) 5 MG tablet   olmesartan  (BENICAR ) 20 MG tablet   Osteoporosis   On  Vit D, Prolia  Pt would like to stop Prolia        Diastolic dysfunction   On Norvasc   Relevant Orders   CBC with Differential/Platelet   Comprehensive metabolic panel with GFR   Coronary artery disease involving native coronary artery of native heart without angina pectoris   The patient is seeing Dr. Veryl Gottron - no angina      Relevant Medications   amLODipine  (NORVASC ) 5 MG tablet   olmesartan  (BENICAR ) 20 MG tablet   Other Relevant Orders   CBC with Differential/Platelet   Labile blood pressure   Anxiety disorder    Lorazepam  helps a lot and is normalizing her BP. Ok to use BID      Relevant Medications   LORazepam  (ATIVAN ) 0.5 MG tablet   Other Relevant Orders   CBC with Differential/Platelet      Meds ordered this encounter  Medications   LORazepam  (ATIVAN ) 0.5 MG tablet    Sig: Take 1 tablet (0.5 mg total) by mouth 2 (two) times daily as needed for anxiety. for anxiety    Dispense:  60 tablet    Refill:  5   amLODipine  (NORVASC ) 5 MG tablet    Sig: Take 1 tablet (5 mg total) by mouth daily.    Dispense:  30 tablet    Refill:  11   olmesartan  (BENICAR ) 20 MG tablet    Sig: Take 1 tablet (20 mg total) by mouth 2 (two) times daily.    Dispense:  180 tablet    Refill:  3    For delivery      Follow-up: Return in about 6 weeks (around 05/04/2024).  Anitra Barn, MD

## 2024-03-23 NOTE — Assessment & Plan Note (Signed)
The patient is seeing Dr. Harrell Gave - no angina

## 2024-03-24 ENCOUNTER — Encounter (HOSPITAL_BASED_OUTPATIENT_CLINIC_OR_DEPARTMENT_OTHER): Payer: Self-pay | Admitting: Cardiology

## 2024-03-24 ENCOUNTER — Ambulatory Visit (HOSPITAL_BASED_OUTPATIENT_CLINIC_OR_DEPARTMENT_OTHER): Payer: Medicare Other | Admitting: Cardiology

## 2024-03-24 VITALS — BP 134/82 | HR 59 | Ht 60.0 in | Wt 116.1 lb

## 2024-03-24 DIAGNOSIS — F411 Generalized anxiety disorder: Secondary | ICD-10-CM | POA: Diagnosis not present

## 2024-03-24 DIAGNOSIS — I2510 Atherosclerotic heart disease of native coronary artery without angina pectoris: Secondary | ICD-10-CM | POA: Diagnosis not present

## 2024-03-24 DIAGNOSIS — M79602 Pain in left arm: Secondary | ICD-10-CM | POA: Diagnosis not present

## 2024-03-24 DIAGNOSIS — R2681 Unsteadiness on feet: Secondary | ICD-10-CM | POA: Diagnosis not present

## 2024-03-24 DIAGNOSIS — G301 Alzheimer's disease with late onset: Secondary | ICD-10-CM | POA: Diagnosis not present

## 2024-03-24 DIAGNOSIS — R531 Weakness: Secondary | ICD-10-CM | POA: Diagnosis not present

## 2024-03-24 DIAGNOSIS — I10 Essential (primary) hypertension: Secondary | ICD-10-CM | POA: Diagnosis not present

## 2024-03-24 DIAGNOSIS — R0989 Other specified symptoms and signs involving the circulatory and respiratory systems: Secondary | ICD-10-CM

## 2024-03-24 DIAGNOSIS — R41841 Cognitive communication deficit: Secondary | ICD-10-CM | POA: Diagnosis not present

## 2024-03-24 DIAGNOSIS — M545 Low back pain, unspecified: Secondary | ICD-10-CM | POA: Diagnosis not present

## 2024-03-24 NOTE — Progress Notes (Signed)
 Cardiology Office Note:  .   Date:  03/24/2024  ID:  MALAIKA MELLEM, DOB Nov 01, 1941, MRN [id number] PCP: Genia Kettering, MD  [facility] Providers Cardiologist:  Sheryle Donning, MD {  History of Present Illness: Aaron Aas   RAND GOWAN is a 83 y.o. female with a hx of HTN, CAD, HLD, paroxysmal atrial fibrillation (during hospitalization in 2017) who is seen for follow up today. I initially met her 07/19/2019 as a new patient to me/prior Dr. Nolan Battle for the evaluation and management of cardiovascular disease.   Med history: Typical day: wakes up around 8-9 AM, Usually doesn't eat breakfast, has a cup of tea (with caffeine). Takes olmesartan  around 10 AM. Doesn't usually eat the rest of the morning as it makes her sick. Eats early lunch around 11:30, usually about 1/2 sandwich. Feels her best around noon. Does well in the afternoon. Dinner is 5:30-6, eats well. Feels well in the evening. Takes her PM meds about 9 PM.   Today: Continues to have variable BP. Saw Dr. Georgia Kipper yesterday, BP with wide range in his office. Notes at home, sometimes can be 90s systolic, had this yesterday. Has not had recent falls since November.   Feels heart racing every day, having this in the office currently but HR in upper 50s at the time (sinus). Notes that this feels like being scared. Feels that lorazepam  is "magic" for these symptoms when they happen, takes once a day on average, works quickly. Feels slow heartbeat only once in a while  ROS: Denies chest pain, shortness of breath at rest or with normal exertion. No PND, orthopnea, LE edema or unexpected weight gain. No syncope. ROS otherwise negative except as noted.   Studies Reviewed: Aaron Aas    EKG:  EKG Interpretation Date/Time:  Wednesday Mar 24 2024 14:54:23 EDT Ventricular Rate:  57 PR Interval:  158 QRS Duration:  88 QT Interval:  440 QTC Calculation: 428 R Axis:   -21  Text Interpretation: Sinus bradycardia Confirmed by Sheryle Donning [phone]) on 03/24/2024 3:48:49 PM    Physical Exam:   VS:  BP (!) 190/78 (BP Location: Left Arm, Patient Position: Sitting, Cuff Size: Normal)   Pulse (!) 59   Ht 5' (1.524 m)   Wt 116 lb 1.6 oz (52.7 kg)   SpO2 99%   BMI 22.67 kg/m    Wt Readings from Last 3 Encounters:  03/24/24 116 lb 1.6 oz (52.7 kg)  03/23/24 116 lb (52.6 kg)  03/17/24 118 lb (53.5 kg)    GEN: Well nourished, well developed in no acute distress HEENT: Normal, moist mucous membranes NECK: No JVD CARDIAC: regular rhythm, normal S1 and S2, no rubs or gallops. No murmur. VASCULAR: Radial and DP pulses 2+ bilaterally. No carotid bruits RESPIRATORY:  Clear to auscultation without rales, wheezing or rhonchi  ABDOMEN: Soft, non-tender, non-distended MUSCULOSKELETAL:  Ambulates independently SKIN: Warm and dry, trivial edema NEUROLOGIC:  Alert and oriented x 3. No focal neuro deficits noted. PSYCHIATRIC:  Normal affect    ASSESSMENT AND PLAN: .   nonobstructive CAD Hypercholesterolemia, LDL goal <70 -on cath 2018 -continue aspirin , atorvastatin  -counseled on red flag warning signs that need immediate medical attention   Hypertension: labile, complicated by anxiety -we have had multiple titrations. Changed to olmesartan  from losartan , added amlodipine  5 mg daily -today and yesterday, her BP is very elevated initially, then improves with sitting still for a time. Also notes significant improvement with lorazepam . We discussed the roll of the  nervous system in BP and how stress/anxiety can affect this.  -with periodic lows into the 90s systolic, discussed that we want to avoid symptomatic lows, do not want to over treat -ambulatory blood pressure monitor reviewed. Remains above goal -limited options for meds. No beta blocker due to history of bradycardia. With intermittent low BP, would like to avoid thiazide/loop/MRA to avoid dehydration/orthostasis. Also has history of kidney stones. -we have also discussed  hydralazine , clonidine, and doxazosin. If lability persists and not able to manage with stress reduction/anxiety meds, may need to consider PRN hydralazine  in the future. Concern would be that BP lowers with resting, so even a PRN could potentially cause symptomatic hypotension if it is only a transiently elevated BP.  -discussed non-pharmacologic ways to manage stress/anxiety if BP elevated  Cardiac risk counseling and prevention recommendations: -recommend heart healthy/Mediterranean diet, with whole grains, fruits, vegetable, fish, lean meats, nuts, and olive oil. Limit salt. -recommend moderate walking, 3-5 times/week for 30-50 minutes each session. Aim for at least 150 minutes.week. Goal should be pace of 3 miles/hours, or walking 1.5 miles in 30 minutes -recommend avoidance of tobacco products. Avoid excess alcohol. -on aspirin  81 mg daily per personal preference  Dispo: 3 mos with Caitlin, then 6 mos with me  Signed, Sheryle Donning, MD   Sheryle Donning, MD, PhD, [facility]  [facility]  [place]  [facility] & V[facility] at [facility] at [facility] [address], [address] [place], [place] [address] [phone]

## 2024-03-24 NOTE — Patient Instructions (Signed)
 Medication Instructions:  Your physician recommends that you continue on your current medications as directed. Please refer to the Current Medication list given to you today.   Follow-Up: Please follow up in 3 months with Neomi Banks, NP. Please follow up in 6 months with Dr. Veryl Gottron.

## 2024-03-26 DIAGNOSIS — M545 Low back pain, unspecified: Secondary | ICD-10-CM | POA: Diagnosis not present

## 2024-03-26 DIAGNOSIS — M79602 Pain in left arm: Secondary | ICD-10-CM | POA: Diagnosis not present

## 2024-03-26 DIAGNOSIS — R41841 Cognitive communication deficit: Secondary | ICD-10-CM | POA: Diagnosis not present

## 2024-03-26 DIAGNOSIS — R531 Weakness: Secondary | ICD-10-CM | POA: Diagnosis not present

## 2024-03-26 DIAGNOSIS — G301 Alzheimer's disease with late onset: Secondary | ICD-10-CM | POA: Diagnosis not present

## 2024-03-26 DIAGNOSIS — R2681 Unsteadiness on feet: Secondary | ICD-10-CM | POA: Diagnosis not present

## 2024-03-29 DIAGNOSIS — M545 Low back pain, unspecified: Secondary | ICD-10-CM | POA: Diagnosis not present

## 2024-03-29 DIAGNOSIS — M79602 Pain in left arm: Secondary | ICD-10-CM | POA: Diagnosis not present

## 2024-03-29 DIAGNOSIS — R531 Weakness: Secondary | ICD-10-CM | POA: Diagnosis not present

## 2024-03-29 DIAGNOSIS — R2681 Unsteadiness on feet: Secondary | ICD-10-CM | POA: Diagnosis not present

## 2024-03-29 DIAGNOSIS — R41841 Cognitive communication deficit: Secondary | ICD-10-CM | POA: Diagnosis not present

## 2024-03-29 DIAGNOSIS — G301 Alzheimer's disease with late onset: Secondary | ICD-10-CM | POA: Diagnosis not present

## 2024-03-31 DIAGNOSIS — R531 Weakness: Secondary | ICD-10-CM | POA: Diagnosis not present

## 2024-03-31 DIAGNOSIS — M79602 Pain in left arm: Secondary | ICD-10-CM | POA: Diagnosis not present

## 2024-03-31 DIAGNOSIS — R41841 Cognitive communication deficit: Secondary | ICD-10-CM | POA: Diagnosis not present

## 2024-03-31 DIAGNOSIS — R2681 Unsteadiness on feet: Secondary | ICD-10-CM | POA: Diagnosis not present

## 2024-03-31 DIAGNOSIS — G301 Alzheimer's disease with late onset: Secondary | ICD-10-CM | POA: Diagnosis not present

## 2024-03-31 DIAGNOSIS — M545 Low back pain, unspecified: Secondary | ICD-10-CM | POA: Diagnosis not present

## 2024-04-02 DIAGNOSIS — M79602 Pain in left arm: Secondary | ICD-10-CM | POA: Diagnosis not present

## 2024-04-02 DIAGNOSIS — R2681 Unsteadiness on feet: Secondary | ICD-10-CM | POA: Diagnosis not present

## 2024-04-02 DIAGNOSIS — G301 Alzheimer's disease with late onset: Secondary | ICD-10-CM | POA: Diagnosis not present

## 2024-04-02 DIAGNOSIS — R41841 Cognitive communication deficit: Secondary | ICD-10-CM | POA: Diagnosis not present

## 2024-04-02 DIAGNOSIS — R531 Weakness: Secondary | ICD-10-CM | POA: Diagnosis not present

## 2024-04-02 DIAGNOSIS — M545 Low back pain, unspecified: Secondary | ICD-10-CM | POA: Diagnosis not present

## 2024-04-05 ENCOUNTER — Ambulatory Visit: Payer: Self-pay | Admitting: *Deleted

## 2024-04-05 NOTE — Telephone Encounter (Signed)
  Chief Complaint: leg swelling with "pimples" like areas noted and worsening.  Symptoms: swelling up to knees. Pink in color  Reports starting new medication Gemtesa Frequency: 2 weeks  Pertinent Negatives: Patient denies chest pain no difficulty breathing no fever no redness no pain  Disposition: [] ED /[] Urgent Care (no appt availability in office) / [x] Appointment(In office/virtual)/ []  [facility]/ [] Home Care/ [] Refused Recommended Disposition /[] [place] [facility]/ []  Follow-up with PCP Additional Notes:    Recommended patient contact Dr. Annitta Kindler prescribed Gemtesa to report side effects . Patient reports she has stopped taking Gemtesa last night.   No available appt with PCP. Scheduled with other provider tomorrow due to getting transportation arranged. Recommended if sx worsen go to ED.     Copied from CRM [id number]. Topic: Clinical - Red Word Triage >> Apr 05, 2024 12:36 PM Luane Rumps D wrote: Red Word that prompted transfer to Nurse Triage: Increased swelling in ankles and feet, as of 3 days ago. Reason for Disposition  [1] MODERATE leg swelling (e.g., swelling extends up to knees) AND [2] new-onset or worsening  Answer Assessment - Initial Assessment Questions 1. ONSET: "When did the swelling start?" (e.g., minutes, hours, days)     3 days  2. LOCATION: "What part of the leg is swollen?"  "Are both legs swollen or just one leg?"     Bilateral legs up to knees 3. SEVERITY: "How bad is the swelling?" (e.g., localized; mild, moderate, severe)   - Localized: Small area of swelling localized to one leg.   - MILD pedal edema: Swelling limited to foot and ankle, pitting edema < 1/4 inch (6 mm) deep, rest and elevation eliminate most or all swelling.   - MODERATE edema: Swelling of lower leg to knee, pitting edema > 1/4 inch (6 mm) deep, rest and elevation only partially reduce swelling.   - SEVERE edema: Swelling extends above knee, facial or hand swelling present.       Swelling with "pimples" on legs  4. REDNESS: "Does the swelling look red or infected?"     No / pink 5. PAIN: "Is the swelling painful to touch?" If Yes, ask: "How painful is it?"   (Scale 1-10; mild, moderate or severe)     no 6. FEVER: "Do you have a fever?" If Yes, ask: "What is it, how was it measured, and when did it start?"      Na  7. CAUSE: "What do you think is causing the leg swelling?"     New medication Gemtesa 8. MEDICAL HISTORY: "Do you have a history of blood clots (e.g., DVT), cancer, heart failure, kidney disease, or liver failure?"     See hx  9. RECURRENT SYMPTOM: "Have you had leg swelling before?" If Yes, ask: "When was the last time?" "What happened that time?"     Yes  10. OTHER SYMPTOMS: "Do you have any other symptoms?" (e.g., chest pain, difficulty breathing)       No  11. PREGNANCY: "Is there any chance you are pregnant?" "When was your last menstrual period?"       na  Protocols used: Leg Swelling and Edema-A-AH

## 2024-04-06 ENCOUNTER — Ambulatory Visit: Admitting: Internal Medicine

## 2024-04-07 DIAGNOSIS — M79602 Pain in left arm: Secondary | ICD-10-CM | POA: Diagnosis not present

## 2024-04-07 DIAGNOSIS — G301 Alzheimer's disease with late onset: Secondary | ICD-10-CM | POA: Diagnosis not present

## 2024-04-07 DIAGNOSIS — R2681 Unsteadiness on feet: Secondary | ICD-10-CM | POA: Diagnosis not present

## 2024-04-07 DIAGNOSIS — R531 Weakness: Secondary | ICD-10-CM | POA: Diagnosis not present

## 2024-04-07 DIAGNOSIS — R41841 Cognitive communication deficit: Secondary | ICD-10-CM | POA: Diagnosis not present

## 2024-04-07 DIAGNOSIS — M545 Low back pain, unspecified: Secondary | ICD-10-CM | POA: Diagnosis not present

## 2024-04-08 DIAGNOSIS — M545 Low back pain, unspecified: Secondary | ICD-10-CM | POA: Diagnosis not present

## 2024-04-08 DIAGNOSIS — R41841 Cognitive communication deficit: Secondary | ICD-10-CM | POA: Diagnosis not present

## 2024-04-08 DIAGNOSIS — M79602 Pain in left arm: Secondary | ICD-10-CM | POA: Diagnosis not present

## 2024-04-08 DIAGNOSIS — R531 Weakness: Secondary | ICD-10-CM | POA: Diagnosis not present

## 2024-04-08 DIAGNOSIS — G301 Alzheimer's disease with late onset: Secondary | ICD-10-CM | POA: Diagnosis not present

## 2024-04-08 DIAGNOSIS — R2681 Unsteadiness on feet: Secondary | ICD-10-CM | POA: Diagnosis not present

## 2024-04-09 DIAGNOSIS — R2681 Unsteadiness on feet: Secondary | ICD-10-CM | POA: Diagnosis not present

## 2024-04-09 DIAGNOSIS — R41841 Cognitive communication deficit: Secondary | ICD-10-CM | POA: Diagnosis not present

## 2024-04-09 DIAGNOSIS — G301 Alzheimer's disease with late onset: Secondary | ICD-10-CM | POA: Diagnosis not present

## 2024-04-09 DIAGNOSIS — M79602 Pain in left arm: Secondary | ICD-10-CM | POA: Diagnosis not present

## 2024-04-09 DIAGNOSIS — R531 Weakness: Secondary | ICD-10-CM | POA: Diagnosis not present

## 2024-04-09 DIAGNOSIS — M545 Low back pain, unspecified: Secondary | ICD-10-CM | POA: Diagnosis not present

## 2024-04-12 DIAGNOSIS — M79602 Pain in left arm: Secondary | ICD-10-CM | POA: Diagnosis not present

## 2024-04-12 DIAGNOSIS — R41841 Cognitive communication deficit: Secondary | ICD-10-CM | POA: Diagnosis not present

## 2024-04-12 DIAGNOSIS — G301 Alzheimer's disease with late onset: Secondary | ICD-10-CM | POA: Diagnosis not present

## 2024-04-12 DIAGNOSIS — R2681 Unsteadiness on feet: Secondary | ICD-10-CM | POA: Diagnosis not present

## 2024-04-12 DIAGNOSIS — M545 Low back pain, unspecified: Secondary | ICD-10-CM | POA: Diagnosis not present

## 2024-04-12 DIAGNOSIS — R531 Weakness: Secondary | ICD-10-CM | POA: Diagnosis not present

## 2024-04-19 ENCOUNTER — Telehealth: Payer: Self-pay | Admitting: Physician Assistant

## 2024-04-19 DIAGNOSIS — M545 Low back pain, unspecified: Secondary | ICD-10-CM | POA: Diagnosis not present

## 2024-04-19 DIAGNOSIS — R41841 Cognitive communication deficit: Secondary | ICD-10-CM | POA: Diagnosis not present

## 2024-04-19 DIAGNOSIS — G301 Alzheimer's disease with late onset: Secondary | ICD-10-CM | POA: Diagnosis not present

## 2024-04-19 DIAGNOSIS — R2681 Unsteadiness on feet: Secondary | ICD-10-CM | POA: Diagnosis not present

## 2024-04-19 DIAGNOSIS — F02B0 Dementia in other diseases classified elsewhere, moderate, without behavioral disturbance, psychotic disturbance, mood disturbance, and anxiety: Secondary | ICD-10-CM | POA: Diagnosis not present

## 2024-04-19 DIAGNOSIS — M4854XA Collapsed vertebra, not elsewhere classified, thoracic region, initial encounter for fracture: Secondary | ICD-10-CM | POA: Diagnosis not present

## 2024-04-19 DIAGNOSIS — M810 Age-related osteoporosis without current pathological fracture: Secondary | ICD-10-CM | POA: Diagnosis not present

## 2024-04-19 NOTE — Telephone Encounter (Signed)
 Inbound call from patient, states she had a flare up of her IBS over the weekend. She would like to speak with a nurse.

## 2024-04-20 DIAGNOSIS — M810 Age-related osteoporosis without current pathological fracture: Secondary | ICD-10-CM | POA: Diagnosis not present

## 2024-04-20 DIAGNOSIS — G301 Alzheimer's disease with late onset: Secondary | ICD-10-CM | POA: Diagnosis not present

## 2024-04-20 DIAGNOSIS — M545 Low back pain, unspecified: Secondary | ICD-10-CM | POA: Diagnosis not present

## 2024-04-20 DIAGNOSIS — F02B0 Dementia in other diseases classified elsewhere, moderate, without behavioral disturbance, psychotic disturbance, mood disturbance, and anxiety: Secondary | ICD-10-CM | POA: Diagnosis not present

## 2024-04-20 DIAGNOSIS — R41841 Cognitive communication deficit: Secondary | ICD-10-CM | POA: Diagnosis not present

## 2024-04-20 DIAGNOSIS — R2681 Unsteadiness on feet: Secondary | ICD-10-CM | POA: Diagnosis not present

## 2024-04-20 NOTE — Telephone Encounter (Signed)
 Called the patient. No answer. Left message of my call.

## 2024-04-21 DIAGNOSIS — S52022D Displaced fracture of olecranon process without intraarticular extension of left ulna, subsequent encounter for closed fracture with routine healing: Secondary | ICD-10-CM | POA: Diagnosis not present

## 2024-04-21 NOTE — Telephone Encounter (Signed)
Called the patient. No answer. Left a voicemail of my call.

## 2024-04-22 DIAGNOSIS — R2681 Unsteadiness on feet: Secondary | ICD-10-CM | POA: Diagnosis not present

## 2024-04-22 DIAGNOSIS — M810 Age-related osteoporosis without current pathological fracture: Secondary | ICD-10-CM | POA: Diagnosis not present

## 2024-04-22 DIAGNOSIS — G301 Alzheimer's disease with late onset: Secondary | ICD-10-CM | POA: Diagnosis not present

## 2024-04-22 DIAGNOSIS — R350 Frequency of micturition: Secondary | ICD-10-CM | POA: Diagnosis not present

## 2024-04-22 DIAGNOSIS — R41841 Cognitive communication deficit: Secondary | ICD-10-CM | POA: Diagnosis not present

## 2024-04-22 DIAGNOSIS — N3020 Other chronic cystitis without hematuria: Secondary | ICD-10-CM | POA: Diagnosis not present

## 2024-04-22 DIAGNOSIS — F02B0 Dementia in other diseases classified elsewhere, moderate, without behavioral disturbance, psychotic disturbance, mood disturbance, and anxiety: Secondary | ICD-10-CM | POA: Diagnosis not present

## 2024-04-22 DIAGNOSIS — M545 Low back pain, unspecified: Secondary | ICD-10-CM | POA: Diagnosis not present

## 2024-04-26 NOTE — Telephone Encounter (Signed)
 Spoke with the patient. She was having a day of loose stools and took 1 Imodium. That was 9 days ago and she has not had a bowel movement since that time. She reports LE edema and bloating. She does not have abdominal pain. She does not have nausea. Patient agrees to purge using Miralax  today. Patient agrees to call her PCP regarding the LE edema. Patient has a follow up appointment next week.

## 2024-04-27 DIAGNOSIS — F02B0 Dementia in other diseases classified elsewhere, moderate, without behavioral disturbance, psychotic disturbance, mood disturbance, and anxiety: Secondary | ICD-10-CM | POA: Diagnosis not present

## 2024-04-27 DIAGNOSIS — M545 Low back pain, unspecified: Secondary | ICD-10-CM | POA: Diagnosis not present

## 2024-04-27 DIAGNOSIS — G301 Alzheimer's disease with late onset: Secondary | ICD-10-CM | POA: Diagnosis not present

## 2024-04-27 DIAGNOSIS — R2681 Unsteadiness on feet: Secondary | ICD-10-CM | POA: Diagnosis not present

## 2024-04-27 DIAGNOSIS — R41841 Cognitive communication deficit: Secondary | ICD-10-CM | POA: Diagnosis not present

## 2024-04-27 DIAGNOSIS — M810 Age-related osteoporosis without current pathological fracture: Secondary | ICD-10-CM | POA: Diagnosis not present

## 2024-04-28 DIAGNOSIS — F02B0 Dementia in other diseases classified elsewhere, moderate, without behavioral disturbance, psychotic disturbance, mood disturbance, and anxiety: Secondary | ICD-10-CM | POA: Diagnosis not present

## 2024-04-28 DIAGNOSIS — M810 Age-related osteoporosis without current pathological fracture: Secondary | ICD-10-CM | POA: Diagnosis not present

## 2024-04-28 DIAGNOSIS — R2681 Unsteadiness on feet: Secondary | ICD-10-CM | POA: Diagnosis not present

## 2024-04-28 DIAGNOSIS — M545 Low back pain, unspecified: Secondary | ICD-10-CM | POA: Diagnosis not present

## 2024-04-28 DIAGNOSIS — G301 Alzheimer's disease with late onset: Secondary | ICD-10-CM | POA: Diagnosis not present

## 2024-04-28 DIAGNOSIS — R41841 Cognitive communication deficit: Secondary | ICD-10-CM | POA: Diagnosis not present

## 2024-04-29 ENCOUNTER — Ambulatory Visit: Payer: Self-pay | Admitting: Internal Medicine

## 2024-04-29 ENCOUNTER — Telehealth (HOSPITAL_BASED_OUTPATIENT_CLINIC_OR_DEPARTMENT_OTHER): Payer: Self-pay | Admitting: Cardiology

## 2024-04-29 DIAGNOSIS — F02B0 Dementia in other diseases classified elsewhere, moderate, without behavioral disturbance, psychotic disturbance, mood disturbance, and anxiety: Secondary | ICD-10-CM | POA: Diagnosis not present

## 2024-04-29 DIAGNOSIS — M810 Age-related osteoporosis without current pathological fracture: Secondary | ICD-10-CM | POA: Diagnosis not present

## 2024-04-29 DIAGNOSIS — R41841 Cognitive communication deficit: Secondary | ICD-10-CM | POA: Diagnosis not present

## 2024-04-29 DIAGNOSIS — G301 Alzheimer's disease with late onset: Secondary | ICD-10-CM | POA: Diagnosis not present

## 2024-04-29 DIAGNOSIS — R2681 Unsteadiness on feet: Secondary | ICD-10-CM | POA: Diagnosis not present

## 2024-04-29 DIAGNOSIS — M545 Low back pain, unspecified: Secondary | ICD-10-CM | POA: Diagnosis not present

## 2024-04-29 NOTE — Telephone Encounter (Signed)
 Returned call to pt to discuss the following,   Difficult to manage via phone only. Of note, her PCP scheduled her work in visits for swelling on 04/06/24 and 04/30/24 both of which she has cancelled. This needs to be evaluated with an in person visit either with her primary care team or our office. Recommend leg elevation, continuing compression stockings, following a low salt diet. Incontinence medications would not affect swelling, would advise against stopping medications without her urology team input.    Clearnce Curia, NP    Patient states she was told by PCP to lay with her feet up above her heart and she states that her swelling is almost gone. Reviewed provider recommendations, patient declines visit at this time.

## 2024-04-29 NOTE — Progress Notes (Deleted)
   Acute Office Visit  Subjective:     Patient ID: [name], female    DOB: Jun 21, 1941, 83 y.o.   MRN: [id number]  No chief complaint on file.   HPI Patient is in today for evaluation of lower leg swelling.   ROS Per HPI      Objective:    There were no vitals taken for this visit.   Physical Exam Vitals and nursing note reviewed.  Constitutional:      General: She is not in acute distress.    Appearance: Normal appearance. She is normal weight.  HENT:     Head: Normocephalic and atraumatic.     Right Ear: External ear normal.     Left Ear: External ear normal.     Nose: Nose normal.     Mouth/Throat:     Mouth: Mucous membranes are moist.     Pharynx: Oropharynx is clear.   Eyes:     Extraocular Movements: Extraocular movements intact.     Pupils: Pupils are equal, round, and reactive to light.    Cardiovascular:     Rate and Rhythm: Normal rate and regular rhythm.     Pulses: Normal pulses.     Heart sounds: Normal heart sounds.  Pulmonary:     Effort: Pulmonary effort is normal. No respiratory distress.     Breath sounds: Normal breath sounds. No wheezing, rhonchi or rales.   Musculoskeletal:        General: Normal range of motion.     Cervical back: Normal range of motion.     Right lower leg: No edema.     Left lower leg: No edema.  Lymphadenopathy:     Cervical: No cervical adenopathy.   Neurological:     General: No focal deficit present.     Mental Status: She is alert and oriented to person, place, and time.   Psychiatric:        Mood and Affect: Mood normal.        Thought Content: Thought content normal.     No results found for any visits on 04/30/24.      Assessment & Plan:   Swelling of lower extremity  Essential hypertension  Late onset Alzheimer's dementia without behavioral disturbance (HCC)     No orders of the defined types were placed in this encounter.   No follow-ups on file.  Wellington Half,  FNP

## 2024-04-29 NOTE — Telephone Encounter (Signed)
 Pt c/o swelling/edema: STAT if pt has developed SOB within 24 hours  If swelling, where is the swelling located? Feet,ankles and legs- Left leg is worse  How much weight have you gained and in what time span?   Have you gained 2 pounds in a day or 5 pounds in a week?   Do you have a log of your daily weights (if so, list)?   Are you currently taking a fluid pill? no  Are you currently SOB? no  Have you traveled recently in a car or plane for an extended period of time? no

## 2024-04-29 NOTE — Telephone Encounter (Signed)
 Difficult to manage via phone only. Of note, her PCP scheduled her work in visits for swelling on 04/06/24 and 04/30/24 both of which she has cancelled. This needs to be evaluated with an in person visit either with her primary care team or our office. Recommend leg elevation, continuing compression stockings, following a low salt diet. Incontinence medications would not affect swelling, would advise against stopping medications without her urology team input.   Chenell Lozon S Santia Labate, NP

## 2024-04-29 NOTE — Telephone Encounter (Signed)
 Returned call to pt. Patient states her legs swell, especially around the ankle. But today her whole legs are swollen and funny looking- up to the knee. She states there is some discoloration in both but worse in the left. No heat or pain to the area. She states they have been really bad about two weeks. She does not weigh herself. She was put on an incontinence drug by urology, she did ask them if this could be causing this. She did stop her incontinence medications on her own. She states urology told her not to take a diuretic. Mild indent when touching the legs.

## 2024-04-29 NOTE — Telephone Encounter (Signed)
 FYI Only or Action Required?: FYI only for provider  Patient was last seen in primary care on 03/23/2024 by Plotnikov, Oakley Bellman, MD. Called Nurse Triage reporting Leg Swelling. Symptoms began several months ago.  Symptoms are: gradually worsening.  Triage Disposition: See PCP When Office is Open (Within 3 Days)  Patient/caregiver understands and will follow disposition?: Yes                 Copied from CRM [id number]. Topic: Clinical - Red Word Triage >> Apr 29, 2024 11:30 AM [name] wrote: Red Word that prompted transfer to Nurse Triage: Pt states she has swollen ankles and feet worse in the left-couple of months now. Swelling goes up to the knee. The swelling goes up and down-less in the mornings-does wear compression stockings which helps but as soon as you take them off it swells up. No pain noted Reason for Disposition  [1] MILD swelling of both ankles (i.e., pedal edema) AND [2] new-onset or worsening  Answer Assessment - Initial Assessment Questions Bilateral leg swelling for a couple of months Worse in left leg, stays below knees Dr. Georgia Kipper and cardiologist is aware Does not interfere with ability to walk Chronic tremors began a long time ago Mild swelling Redness Denies pain, difficulty breathing  Protocols used: Leg Swelling and Edema-A-AH

## 2024-04-30 ENCOUNTER — Ambulatory Visit: Admitting: Family Medicine

## 2024-05-04 DIAGNOSIS — R2681 Unsteadiness on feet: Secondary | ICD-10-CM | POA: Diagnosis not present

## 2024-05-04 DIAGNOSIS — G301 Alzheimer's disease with late onset: Secondary | ICD-10-CM | POA: Diagnosis not present

## 2024-05-04 DIAGNOSIS — M545 Low back pain, unspecified: Secondary | ICD-10-CM | POA: Diagnosis not present

## 2024-05-04 DIAGNOSIS — F02B0 Dementia in other diseases classified elsewhere, moderate, without behavioral disturbance, psychotic disturbance, mood disturbance, and anxiety: Secondary | ICD-10-CM | POA: Diagnosis not present

## 2024-05-04 DIAGNOSIS — R41841 Cognitive communication deficit: Secondary | ICD-10-CM | POA: Diagnosis not present

## 2024-05-04 DIAGNOSIS — M810 Age-related osteoporosis without current pathological fracture: Secondary | ICD-10-CM | POA: Diagnosis not present

## 2024-05-05 DIAGNOSIS — R41841 Cognitive communication deficit: Secondary | ICD-10-CM | POA: Diagnosis not present

## 2024-05-05 DIAGNOSIS — R2681 Unsteadiness on feet: Secondary | ICD-10-CM | POA: Diagnosis not present

## 2024-05-05 DIAGNOSIS — M545 Low back pain, unspecified: Secondary | ICD-10-CM | POA: Diagnosis not present

## 2024-05-05 DIAGNOSIS — M810 Age-related osteoporosis without current pathological fracture: Secondary | ICD-10-CM | POA: Diagnosis not present

## 2024-05-05 DIAGNOSIS — G301 Alzheimer's disease with late onset: Secondary | ICD-10-CM | POA: Diagnosis not present

## 2024-05-05 DIAGNOSIS — F02B0 Dementia in other diseases classified elsewhere, moderate, without behavioral disturbance, psychotic disturbance, mood disturbance, and anxiety: Secondary | ICD-10-CM | POA: Diagnosis not present

## 2024-05-11 DIAGNOSIS — M545 Low back pain, unspecified: Secondary | ICD-10-CM | POA: Diagnosis not present

## 2024-05-11 DIAGNOSIS — R2681 Unsteadiness on feet: Secondary | ICD-10-CM | POA: Diagnosis not present

## 2024-05-11 DIAGNOSIS — F02B0 Dementia in other diseases classified elsewhere, moderate, without behavioral disturbance, psychotic disturbance, mood disturbance, and anxiety: Secondary | ICD-10-CM | POA: Diagnosis not present

## 2024-05-11 DIAGNOSIS — R41841 Cognitive communication deficit: Secondary | ICD-10-CM | POA: Diagnosis not present

## 2024-05-11 DIAGNOSIS — G301 Alzheimer's disease with late onset: Secondary | ICD-10-CM | POA: Diagnosis not present

## 2024-05-11 DIAGNOSIS — M810 Age-related osteoporosis without current pathological fracture: Secondary | ICD-10-CM | POA: Diagnosis not present

## 2024-05-12 DIAGNOSIS — F02B0 Dementia in other diseases classified elsewhere, moderate, without behavioral disturbance, psychotic disturbance, mood disturbance, and anxiety: Secondary | ICD-10-CM | POA: Diagnosis not present

## 2024-05-12 DIAGNOSIS — M810 Age-related osteoporosis without current pathological fracture: Secondary | ICD-10-CM | POA: Diagnosis not present

## 2024-05-12 DIAGNOSIS — R2681 Unsteadiness on feet: Secondary | ICD-10-CM | POA: Diagnosis not present

## 2024-05-12 DIAGNOSIS — G301 Alzheimer's disease with late onset: Secondary | ICD-10-CM | POA: Diagnosis not present

## 2024-05-12 DIAGNOSIS — R41841 Cognitive communication deficit: Secondary | ICD-10-CM | POA: Diagnosis not present

## 2024-05-12 DIAGNOSIS — M545 Low back pain, unspecified: Secondary | ICD-10-CM | POA: Diagnosis not present

## 2024-05-13 DIAGNOSIS — M545 Low back pain, unspecified: Secondary | ICD-10-CM | POA: Diagnosis not present

## 2024-05-13 DIAGNOSIS — G301 Alzheimer's disease with late onset: Secondary | ICD-10-CM | POA: Diagnosis not present

## 2024-05-13 DIAGNOSIS — R41841 Cognitive communication deficit: Secondary | ICD-10-CM | POA: Diagnosis not present

## 2024-05-13 DIAGNOSIS — M810 Age-related osteoporosis without current pathological fracture: Secondary | ICD-10-CM | POA: Diagnosis not present

## 2024-05-13 DIAGNOSIS — F02B0 Dementia in other diseases classified elsewhere, moderate, without behavioral disturbance, psychotic disturbance, mood disturbance, and anxiety: Secondary | ICD-10-CM | POA: Diagnosis not present

## 2024-05-13 DIAGNOSIS — R2681 Unsteadiness on feet: Secondary | ICD-10-CM | POA: Diagnosis not present

## 2024-05-14 ENCOUNTER — Ambulatory Visit: Admitting: Physician Assistant

## 2024-05-17 ENCOUNTER — Encounter: Payer: Self-pay | Admitting: Podiatry

## 2024-05-17 ENCOUNTER — Ambulatory Visit (INDEPENDENT_AMBULATORY_CARE_PROVIDER_SITE_OTHER): Admitting: Podiatry

## 2024-05-17 DIAGNOSIS — L600 Ingrowing nail: Secondary | ICD-10-CM

## 2024-05-17 NOTE — Progress Notes (Signed)
  Subjective:  Patient ID: MONSERRATH JUNIO, female    DOB: 03/08/1941,   MRN: [id number]  Chief Complaint  Patient presents with   Toe Pain    big toe left foot red down to the knuckle x 1 week, 0 pain. Foot swollen.    83 y.o. female presents for left great toe redness and swelling. This occurred about week ago. History of left ingrown nail removal in February on medial border. Relates it is not painful but has been swollen and red.She did some soaking and put campho phenique on it and relates this did improve things. Still wanted to make sure it was ok.  . Denies any other pedal complaints. Denies n/v/f/c.   Past Medical History:  Diagnosis Date   Abdominal pain, unspecified site    ADVERSE REACTION TO MEDICATION 07/31/2009   ANEMIA-NOS 07/07/2007   Anxiety    Arthritis    Chest pain, unspecified    Chronic kidney disease    hx of kidney stone   COLONIC POLYPS, ADENOMATOUS, HX OF    Complication of anesthesia    Dementia (HCC)    Disturbance of skin sensation    DIVERTICULOSIS, COLON 02/23/2009   Dyspnea    Family history of diabetes mellitus    Family history of malignant neoplasm of breast    FATIGUE 05/25/2009   Fever, unspecified    Heart murmur    hx of   HYPERLIPIDEMIA 07/07/2007   HYPERPARATHYROIDISM UNSPECIFIED 10/20/2007   HYPERTENSION 02/04/2008   Incomplete right bundle branch block (RBBB) determined by electrocardiography 04/15/2023   Irritable bowel syndrome (IBS)    NEPHROLITHIASIS, HX OF 11/15/2008   OSTEOPOROSIS 01/01/2008   Other acquired absence of organ    parathyroidectomy   Pain in limb    PALPITATIONS, OCCASIONAL 07/12/2008   PARATHYROIDECTOMY 01/02/2009   PONV (postoperative nausea and vomiting)    SCOLIOSIS 05/25/2009   Seasonal allergies    Unspecified constipation    URINARY INCONTINENCE 07/07/2007   UTI'S, RECURRENT 11/20/2009   kimbrough     Objective:  Physical Exam: Vascular: DP/PT pulses 2/4 bilateral. CFT <3 seconds. Normal hair  growth on digits. No edema.  Skin. No lacerations or abrasions bilateral feet. Left hallux nail appears healthy. No erythema edema or purulence noted. No tenderness to palpation. No incurvation noted Musculoskeletal: MMT 5/5 bilateral lower extremities in DF, PF, Inversion and Eversion. Deceased ROM in DF of ankle joint.  Neurological: Sensation intact to light touch.   Assessment:   1. Ingrown left greater toenail      Plan:  Patient was evaluated and treated and all questions answered. Discussed ingrown toenails etiology and treatment options including procedure for removal vs conservative care.  Discussed continued soaks as needed and neosporin if needed  Return as needed for concerns.   Asberry Failing, DPM

## 2024-05-18 ENCOUNTER — Telehealth: Payer: Self-pay | Admitting: Internal Medicine

## 2024-05-18 DIAGNOSIS — G301 Alzheimer's disease with late onset: Secondary | ICD-10-CM | POA: Diagnosis not present

## 2024-05-18 DIAGNOSIS — M545 Low back pain, unspecified: Secondary | ICD-10-CM | POA: Diagnosis not present

## 2024-05-18 DIAGNOSIS — M810 Age-related osteoporosis without current pathological fracture: Secondary | ICD-10-CM | POA: Diagnosis not present

## 2024-05-18 DIAGNOSIS — R2681 Unsteadiness on feet: Secondary | ICD-10-CM | POA: Diagnosis not present

## 2024-05-18 DIAGNOSIS — F02B0 Dementia in other diseases classified elsewhere, moderate, without behavioral disturbance, psychotic disturbance, mood disturbance, and anxiety: Secondary | ICD-10-CM | POA: Diagnosis not present

## 2024-05-18 DIAGNOSIS — M4854XA Collapsed vertebra, not elsewhere classified, thoracic region, initial encounter for fracture: Secondary | ICD-10-CM | POA: Diagnosis not present

## 2024-05-18 DIAGNOSIS — R41841 Cognitive communication deficit: Secondary | ICD-10-CM | POA: Diagnosis not present

## 2024-05-18 NOTE — Telephone Encounter (Signed)
 Copied from CRM [id number]. Topic: Referral - Question >> May 18, 2024 10:31 AM Mesmerise C wrote: Reason for CRM: Patient stated she received a letter about a referral being processed on 5/4 with a date on 6/25 for a gastro doctor she had an appointment scheduled on Friday but they cancelled it and is looking to get an update if she's going to another doctor for an appointment can be reached at  [phone]

## 2024-05-19 DIAGNOSIS — R2681 Unsteadiness on feet: Secondary | ICD-10-CM | POA: Diagnosis not present

## 2024-05-19 DIAGNOSIS — M545 Low back pain, unspecified: Secondary | ICD-10-CM | POA: Diagnosis not present

## 2024-05-19 DIAGNOSIS — F02B0 Dementia in other diseases classified elsewhere, moderate, without behavioral disturbance, psychotic disturbance, mood disturbance, and anxiety: Secondary | ICD-10-CM | POA: Diagnosis not present

## 2024-05-19 DIAGNOSIS — G301 Alzheimer's disease with late onset: Secondary | ICD-10-CM | POA: Diagnosis not present

## 2024-05-19 DIAGNOSIS — M810 Age-related osteoporosis without current pathological fracture: Secondary | ICD-10-CM | POA: Diagnosis not present

## 2024-05-19 DIAGNOSIS — R41841 Cognitive communication deficit: Secondary | ICD-10-CM | POA: Diagnosis not present

## 2024-05-20 DIAGNOSIS — R41841 Cognitive communication deficit: Secondary | ICD-10-CM | POA: Diagnosis not present

## 2024-05-20 DIAGNOSIS — G301 Alzheimer's disease with late onset: Secondary | ICD-10-CM | POA: Diagnosis not present

## 2024-05-20 DIAGNOSIS — F02B0 Dementia in other diseases classified elsewhere, moderate, without behavioral disturbance, psychotic disturbance, mood disturbance, and anxiety: Secondary | ICD-10-CM | POA: Diagnosis not present

## 2024-05-20 DIAGNOSIS — M810 Age-related osteoporosis without current pathological fracture: Secondary | ICD-10-CM | POA: Diagnosis not present

## 2024-05-20 DIAGNOSIS — R2681 Unsteadiness on feet: Secondary | ICD-10-CM | POA: Diagnosis not present

## 2024-05-20 DIAGNOSIS — M545 Low back pain, unspecified: Secondary | ICD-10-CM | POA: Diagnosis not present

## 2024-05-20 NOTE — Telephone Encounter (Signed)
 I am sorry, I do not understand what we need to do.  Thank you

## 2024-05-24 ENCOUNTER — Ambulatory Visit (INDEPENDENT_AMBULATORY_CARE_PROVIDER_SITE_OTHER): Admitting: Physician Assistant

## 2024-05-24 ENCOUNTER — Encounter: Payer: Self-pay | Admitting: Physician Assistant

## 2024-05-24 VITALS — BP 128/66 | HR 73 | Ht <= 58 in | Wt 117.5 lb

## 2024-05-24 DIAGNOSIS — K5909 Other constipation: Secondary | ICD-10-CM

## 2024-05-24 DIAGNOSIS — K582 Mixed irritable bowel syndrome: Secondary | ICD-10-CM

## 2024-05-24 NOTE — Progress Notes (Signed)
 Chief Complaint: IBS flare  HPI:    [name] is an 83 year old female with a past medical history as listed below, who was referred to me by Plotnikov, Teresa GAILS, MD for a complaint of IBS flare.     05/29/2018 colonoscopy was severe sigmoid diverticulosis and internal hemorrhoids and otherwise normal.  Negative for microscopic colitis.    08/10/2018 patient seen by Dr. Nandigam and had started Zenpep  and Colestid  and was doing some better.    08/21/2021 patient seen in clinic for constipation.  At that time ordered 2 view abdominal x-ray and recommended patient start Linzess  72 mcg daily.    08/21/2021 abdominal x-ray with left nephrolithiasis and a large amount of stool throughout the colon.  She was told to do a bowel purge with MiraLAX  and then start the Linzess  72 mcg daily.    01/04/2022 patient seen in clinic by me and at that time described that she had never done anything that we talked about at the previous visit as she was worried about urgency and lack of control of her bowel movements she never did a bowel purge and never started the Linzess .  She had been using MiraLAX  which was not really helping.  She had not had a bowel movement in 8 days.  At that time ordered a stat abdominal x-ray for concern over obstruction.  Discussed that pending results we will go ahead and do a MiraLAX  bowel purge and start Linzess  as we discussed at previous visit.    01/10/2022 patient called and described that she had taken the 8 samples of Linzess  and had no bowel movement.  She had some abdominal swelling and discomfort.  Her Linzess  was increased 245 mcg daily and she was told to do another half of a MiraLAX  purge.    01/11/2022 patient called back and described that she had an uncontrollable episode of diarrhea immediately after dinner.  She was told to stay on the Linzess  72 mcg.    01/21/2022 patient described an uncontrollable blowout 11 days prior on 2/23 she had continued Linzess  72 mcg daily since then  with no movement other than a few pebbles.  She describes some stomach pain and gurgling and nausea with cold sweats and shaking.  She felt lousy.  Dr. Nandigam was asked for her opinion and did not give any further recommendations.  Another x-ray was ordered to see if she was still constipated.    01/28/2022 patient had abdominal x-ray which showed nonobstructive nonspecific bowel gas pattern.    01/30/2022 office visit with me and described that the Linzess  72 mcg had started to work.  He still felt quite ill with nausea and some epigastric pain.  At that time reviewed recent x-ray of her abdomen which did not show constipation.  Recommend she start over-the-counter Omeprazole  20 mg daily for epigastric pain and nausea.    09/24/2022 patient was using MiraLAX  every 3 days.  Also some right upper quadrant pain.  At that time discussed abdominal x-ray which she declined.  Gave her a trial of Amitiza  8 mcg with food twice daily.  Ordered right upper quadrant ultrasound.    02/26/2023 patient followed with Dr. Shila regards to her IBS.  At that time discussed nausea, bloating and cold sweats.  Alternating constipation and diarrhea.  At time reported Amitiza  and Linzess  were unhelpful in managing her symptoms.  At that time discussed chronic irritable bowel syndrome with alternating diarrhea and constipation with borderline low fecal elastase.  She was told to start drinking at least 4-5 bottles of water a day and increase fiber with Benefiber 1 tablespoon 2-3 times daily as well as MiraLAX  and prune juice.  Advised her to take senna once every 2 to 3 days as needed.  Discussed as needed Imodium if she needed to go somewhere.  She was referred to pelvic floor physical therapy for biofeedback.    05/24/2023 patient discussed that the pelvic floor therapist recommended a follow-up.  She is having bowel movements 3 times a week.    05/27/2023 patient saw her PCP.  At that time having episodes of feeling shaky, sweaty  after morning meals.  All morning hypertensive meds were discontinued.  At that time recommended she stop drinking coffee first thing in the morning and replace with water and start MiraLAX  daily.    07/01/2023 patient saw cardiology in regards to low blood pressure in the mornings which is making her feel bad.  At that time they changed the timing of her olmesartan .    07/31/2023 patient seen in clinic and at that time remain constipated.  She has seen a pelvic floor therapist multiple times no change in symptoms so they discontinued her.  She tried Amitiza  and Linzess  which were too strong, was using MiraLAX  and a half a dose for 3 days in a row until she has diarrhea.  And then would wait and restart MiraLAX  again.  At that point recommended increasing MiraLAX  to a half a dose twice a day.  Also recommended Benefiber.    Today, the patient reports to clinic and tells me her symptoms are much the same.  She was using MiraLAX  as above which helped for a couple of weeks but then she started having blowouts again.  Now she is not using MiraLAX  at all and will go 3 days with somewhat normal solid stool but still feels like she is full over these days and then within the next couple of days she will have an urgent blowout, she never knows when this is going to happen and it is very unpredictable so it stops her from going places.  She then may go 6 or 7 days without a bowel movement at all.  She never remembers trying the fiber as we discussed.  Feels like the MiraLAX  was too strong for her.  In between gets bloated and has some generalized abdominal discomfort.    Denies fever, chills, blood in her stool or weight loss.  GI Hx: US  Abdomen Limited RUQ 10-03-22  No cholelithiasis or sonographic evidence for acute cholecystitis.    DG Abd 2 view 01-29-22 1. Nonobstructive, nonspecific bowel gas pattern.    DG Abd 2 view 01-04-22 Nonobstructive bowel gas pattern.    Colonoscopy 06-03-18 - Decreased sphincter  tone found on digital rectal exam. - Moderate diverticulosis in the sigmoid colon. There was narrowing of the colon in association with the diverticular opening. There was evidence of diverticular spasm. - Non-bleeding internal hemorrhoids. - Normal mucosa in the entire examined colon. Biopsied.  Biopsies negative for microscopic colitis.   Colonoscopy 10-23-15 1. 9 mm polyp in the cecum at the appendiceal orifice, injected submucosal saline to lift the mucosa, was unable to completely lift the polyp, snare polypectomy was performed in piecemeal but felt there may be an invasive complement and it was incomplete resection 2. Mild diverticulosis was noted in the sigmoid colon 3. Small internal hemorrhoids     04/19/2024 patient called in and taken Imodium for  some diarrhea and then had not had a bowel movement.  She was advised to do a MiraLAX  purge.   Past Medical History:  Diagnosis Date   Abdominal pain, unspecified site    ADVERSE REACTION TO MEDICATION 07/31/2009   ANEMIA-NOS 07/07/2007   Anxiety    Arthritis    Chest pain, unspecified    Chronic kidney disease    hx of kidney stone   COLONIC POLYPS, ADENOMATOUS, HX OF    Complication of anesthesia    Dementia (HCC)    Disturbance of skin sensation    DIVERTICULOSIS, COLON 02/23/2009   Dyspnea    Family history of diabetes mellitus    Family history of malignant neoplasm of breast    FATIGUE 05/25/2009   Fever, unspecified    Heart murmur    hx of   HYPERLIPIDEMIA 07/07/2007   HYPERPARATHYROIDISM UNSPECIFIED 10/20/2007   HYPERTENSION 02/04/2008   Incomplete right bundle branch block (RBBB) determined by electrocardiography 04/15/2023   Irritable bowel syndrome (IBS)    NEPHROLITHIASIS, HX OF 11/15/2008   OSTEOPOROSIS 01/01/2008   Other acquired absence of organ    parathyroidectomy   Pain in limb    PALPITATIONS, OCCASIONAL 07/12/2008   PARATHYROIDECTOMY 01/02/2009   PONV (postoperative nausea and vomiting)    SCOLIOSIS  05/25/2009   Seasonal allergies    Unspecified constipation    URINARY INCONTINENCE 07/07/2007   UTI'S, RECURRENT 11/20/2009   kimbrough     Past Surgical History:  Procedure Laterality Date   BUNIONECTOMY     CATARACT EXTRACTION, BILATERAL     COLONOSCOPY     EXTRACORPOREAL SHOCK WAVE LITHOTRIPSY Left 12/21/2020   Procedure: EXTRACORPOREAL SHOCK WAVE LITHOTRIPSY (ESWL);  Surgeon: Nieves Cough, MD;  Location: [facility];  Service: Urology;  Laterality: Left;   LAPAROSCOPIC APPENDECTOMY  01/19/2016   Procedure: APPENDECTOMY LAPAROSCOPIC with orifice of cecal polyp;  Surgeon: Bernarda Ned, MD;  Location: [facility];  Service: General;;   LEFT HEART CATH AND CORONARY ANGIOGRAPHY N/A 04/07/2017   Procedure: Left Heart Cath and Coronary Angiography;  Surgeon: Mady Bruckner, MD;  Location: [facility];  Service: Cardiovascular;  Laterality: N/A;   OLECRANON BURSECTOMY Left 10/21/2023   Procedure: OLECRANON (ELBOW) BURSA;  Surgeon: Beverley Evalene BIRCH, MD;  Location: [facility];  Service: Orthopedics;  Laterality: Left;   ORIF ELBOW FRACTURE Left 10/21/2023   Procedure: Open Reduction Internal fixation (ORIF), fracture, elbow/olecranon;  Surgeon: Beverley Evalene BIRCH, MD;  Location: [facility];  Service: Orthopedics;  Laterality: Left;   PARATHYROIDECTOMY     Rt Superior open neck exploration   POLYPECTOMY     RIGHT OOPHORECTOMY     TONSILLECTOMY      Current Outpatient Medications  Medication Sig Dispense Refill   acetaminophen  (TYLENOL ) 500 MG tablet Take 2 tablets (1,000 mg total) by mouth 2 (two) times daily. (Patient taking differently: Take 1,000 mg by mouth as needed for moderate pain (pain score 4-6).)     acetaminophen  (TYLENOL ) 500 MG tablet Take 2 tablets (1,000 mg total) by mouth daily as needed.     amLODipine  (NORVASC ) 5 MG tablet Take 1 tablet (5 mg total) by mouth daily. 30 tablet 11   b complex vitamins capsule Take 1 capsule by mouth daily.     Cholecalciferol  (VITAMIN D3) 125 MCG (5000 UT) CAPS Take 1 capsule (5,000 Units total) by mouth daily.     denosumab  (PROLIA ) 60 MG/ML SOSY injection Inject 60 mg into the skin every 6 (six)  months.     Ibuprofen  (ADVIL  PO) Take by mouth.     LORazepam  (ATIVAN ) 0.5 MG tablet Take 1 tablet (0.5 mg total) by mouth 2 (two) times daily as needed for anxiety. for anxiety 60 tablet 5   olmesartan  (BENICAR ) 20 MG tablet Take 1 tablet (20 mg total) by mouth 2 (two) times daily. 180 tablet 3   ondansetron  (ZOFRAN -ODT) 4 MG disintegrating tablet Take 4 mg by mouth every 8 (eight) hours as needed for nausea or vomiting.     oxycodone  (OXY-IR) 5 MG capsule Take 2.5 mg by mouth every 4 (four) hours as needed for pain.     polyethylene glycol (MIRALAX  / GLYCOLAX ) 17 g packet Take 17 g by mouth daily as needed for mild constipation.     polyethylene glycol powder (GLYCOLAX /MIRALAX ) 17 GM/SCOOP powder Take 17 g by mouth daily.     senna (SENOKOT) 8.6 MG TABS tablet Take 2 tablets (17.2 mg total) by mouth at bedtime as needed for mild constipation. (Patient taking differently: Take 2 tablets by mouth at bedtime.)     traZODone  (DESYREL ) 50 MG tablet Take 25 mg by mouth at bedtime.     Vibegron (GEMTESA PO) Take by mouth. Urinary incontinence     No current facility-administered medications for this visit.    Allergies as of 05/24/2024 - Review Complete 05/17/2024  Allergen Reaction Noted   Anesthetics, amide Other (See Comments) 02/20/2016   Lisinopril Cough 10/05/2009    Family History  Problem Relation Age of Onset   Heart disease Mother        valve replacement   Sudden death Father 58   Heart disease Father 3       MI   Breast cancer Paternal Aunt    Diabetes Other        1st degree relative   Colon cancer Neg Hx    Stomach cancer Neg Hx    Esophageal cancer Neg Hx    Pancreatic cancer Neg Hx     Social History   Socioeconomic History   Marital status: Widowed    Spouse name: Not on file   Number of  children: 3   Years of education: Not on file   Highest education level: Not on file  Occupational History   Occupation: travel agent    Employer: RETIRED  Tobacco Use   Smoking status: Never    Passive exposure: Never   Smokeless tobacco: Never  Vaping Use   Vaping status: Never Used  Substance and Sexual Activity   Alcohol use: Yes    Comment: 1 glass of wine per day   Drug use: No   Sexual activity: Never  Other Topics Concern   Not on file  Social History Narrative       2-3 c coffee in am    ocass wine    G3P2vaginal delivery   Pt cell [phone]      Patient walks daily for exercise.    Social Drivers of Corporate investment banker Strain: Low Risk  (03/17/2024)   Overall Financial Resource Strain (CARDIA)    Difficulty of Paying Living Expenses: Not hard at all  Food Insecurity: No Food Insecurity (03/17/2024)   Hunger Vital Sign    Worried About Running Out of Food in the Last Year: Never true    Ran Out of Food in the Last Year: Never true  Transportation Needs: No Transportation Needs (03/17/2024)   PRAPARE - Transportation    Lack  of Transportation (Medical): No    Lack of Transportation (Non-Medical): No  Physical Activity: Insufficiently Active (03/17/2024)   Exercise Vital Sign    Days of Exercise per Week: 3 days    Minutes of Exercise per Session: 20 min  Stress: No Stress Concern Present (03/17/2024)   [employer] of Occupational Health - Occupational Stress Questionnaire    Feeling of Stress : Not at all  Social Connections: Moderately Integrated (03/17/2024)   Social Connection and Isolation Panel    Frequency of Communication with Friends and Family: More than three times a week    Frequency of Social Gatherings with Friends and Family: More than three times a week    Attends Religious Services: More than 4 times per year    Active Member of [employer] or Organizations: Yes    Attends Banker Meetings: More than 4 times per year     Marital Status: Widowed  Intimate Partner Violence: Not At Risk (03/17/2024)   Humiliation, Afraid, Rape, and Kick questionnaire    Fear of Current or Ex-Partner: No    Emotionally Abused: No    Physically Abused: No    Sexually Abused: No    Review of Systems:    Constitutional: No weight loss, fever or chills Cardiovascular: No chest pain Respiratory: No SOB  Gastrointestinal: See HPI and otherwise negative   Physical Exam:  Vital signs: BP 128/66 (BP Location: Left Arm, Patient Position: Sitting, Cuff Size: Normal)   Pulse 73   Ht 4' 10 (1.473 m)   Wt 117 lb 8 oz (53.3 kg)   BMI 24.56 kg/m    Constitutional:   Pleasant elderly Caucasian female appears to be in NAD, Well developed, Well nourished, alert and cooperative Respiratory: Respirations even and unlabored. Lungs clear to auscultation bilaterally.   No wheezes, crackles, or rhonchi.  Cardiovascular: Normal S1, S2. No MRG. Regular rate and rhythm. No peripheral edema, cyanosis or pallor.  Gastrointestinal:  Soft, mild distention, mild to moderate left-sided TTP, no rebound or guarding. Decreased BS all four quadrants No appreciable masses or hepatomegaly. Rectal:  Not performed.  Psychiatric: Demonstrates good judgement and reason without abnormal affect or behaviors.  RELEVANT LABS AND IMAGING: CBC    Component Value Date/Time   WBC 6.2 12/23/2023 1612   RBC 4.21 12/23/2023 1612   HGB 13.6 12/23/2023 1612   HGB 13.8 04/19/2022 1115   HCT 41.0 12/23/2023 1612   HCT 40.6 04/19/2022 1115   PLT 217.0 12/23/2023 1612   PLT 172 04/19/2022 1115   MCV 97.4 12/23/2023 1612   MCV 94 04/19/2022 1115   MCH 32.2 10/21/2023 0657   MCHC 33.1 12/23/2023 1612   RDW 14.2 12/23/2023 1612   RDW 12.2 04/19/2022 1115   LYMPHSABS 1.6 12/23/2023 1612   LYMPHSABS 1.4 04/01/2017 1001   MONOABS 0.7 12/23/2023 1612   EOSABS 0.1 12/23/2023 1612   EOSABS 0.1 04/01/2017 1001   BASOSABS 0.1 12/23/2023 1612   BASOSABS 0.1 04/01/2017  1001    CMP     Component Value Date/Time   NA 140 12/23/2023 1612   NA 141 04/19/2022 1115   K 4.1 12/23/2023 1612   CL 105 12/23/2023 1612   CO2 27 12/23/2023 1612   GLUCOSE 99 12/23/2023 1612   BUN 19 12/23/2023 1612   BUN 16 04/19/2022 1115   CREATININE 0.77 12/23/2023 1612   CREATININE 0.64 01/25/2015 1112   CALCIUM  10.3 12/23/2023 1612   CALCIUM  9.2 09/07/2010 2148   PROT 6.5  12/23/2023 1612   ALBUMIN 4.1 12/23/2023 1612   AST 18 12/23/2023 1612   ALT 15 12/23/2023 1612   ALKPHOS 64 12/23/2023 1612   BILITOT 0.7 12/23/2023 1612   GFRNONAA >60 10/21/2023 0657   GFRAA >60 12/18/2017 0509    Assessment: 1.  Chronic constipation with overflow: Patient still suffers from constipation with presumed overflow, now not using any products because she continued to have urgent blowouts even with MiraLAX , has tried Amitiza  and Linzess  in the past which were far too strong for her, finds all of this limiting to her daily activities  Plan: 1.  At this point with MiraLAX , Amitiza  and Linzess  all being too strong for her per her thoughts.  Would recommend that she start a fiber supplement such as Benefiber 1 tablespoon daily for a week and increase to twice a day.  Continue increased water intake. 2.  Tried again to discuss constipation with overflow and how I feel like she is really just not getting a good bowel movement, but she is too nervous to use laxatives. 3.  Set patient up for follow-up with Dr. Nandigam.  She has not seen her in some time.  Teresa Failing, PA-C [facility] 05/24/2024, 10:23 AM  Cc: Garald Teresa GAILS, MD

## 2024-05-24 NOTE — Patient Instructions (Addendum)
 A high fiber diet with plenty of fluids (up to 8 glasses of water daily) is suggested to relieve these symptoms.  Benefiber, daily can be used to keep bowels regular.  _______________________________________________________  If your blood pressure at your visit was 140/90 or greater, please contact your primary care physician to follow up on this.  _______________________________________________________  If you are age 84 or older, your body mass index should be between 23-30. Your Body mass index is 24.56 kg/m. If this is out of the aforementioned range listed, please consider follow up with your Primary Care Provider.  If you are age 76 or younger, your body mass index should be between 19-25. Your Body mass index is 24.56 kg/m. If this is out of the aformentioned range listed, please consider follow up with your Primary Care Provider.   ________________________________________________________  The [facility] providers would like to encourage you to use MYCHART to communicate with providers for non-urgent requests or questions.  Due to long hold times on the telephone, sending your provider a message by [facility] may be a faster and more efficient way to get a response.  Please allow 48 business hours for a response.  Please remember that this is for non-urgent requests.  _______________________________________________________

## 2024-05-24 NOTE — Telephone Encounter (Signed)
 Looks as though pt has an apptmnt today with GI.

## 2024-05-25 DIAGNOSIS — F02B0 Dementia in other diseases classified elsewhere, moderate, without behavioral disturbance, psychotic disturbance, mood disturbance, and anxiety: Secondary | ICD-10-CM | POA: Diagnosis not present

## 2024-05-25 DIAGNOSIS — M810 Age-related osteoporosis without current pathological fracture: Secondary | ICD-10-CM | POA: Diagnosis not present

## 2024-05-25 DIAGNOSIS — M545 Low back pain, unspecified: Secondary | ICD-10-CM | POA: Diagnosis not present

## 2024-05-25 DIAGNOSIS — G301 Alzheimer's disease with late onset: Secondary | ICD-10-CM | POA: Diagnosis not present

## 2024-05-25 DIAGNOSIS — R41841 Cognitive communication deficit: Secondary | ICD-10-CM | POA: Diagnosis not present

## 2024-05-25 DIAGNOSIS — R2681 Unsteadiness on feet: Secondary | ICD-10-CM | POA: Diagnosis not present

## 2024-05-26 DIAGNOSIS — M545 Low back pain, unspecified: Secondary | ICD-10-CM | POA: Diagnosis not present

## 2024-05-26 DIAGNOSIS — G301 Alzheimer's disease with late onset: Secondary | ICD-10-CM | POA: Diagnosis not present

## 2024-05-26 DIAGNOSIS — F02B0 Dementia in other diseases classified elsewhere, moderate, without behavioral disturbance, psychotic disturbance, mood disturbance, and anxiety: Secondary | ICD-10-CM | POA: Diagnosis not present

## 2024-05-26 DIAGNOSIS — M810 Age-related osteoporosis without current pathological fracture: Secondary | ICD-10-CM | POA: Diagnosis not present

## 2024-05-26 DIAGNOSIS — R2681 Unsteadiness on feet: Secondary | ICD-10-CM | POA: Diagnosis not present

## 2024-05-26 DIAGNOSIS — R41841 Cognitive communication deficit: Secondary | ICD-10-CM | POA: Diagnosis not present

## 2024-05-27 DIAGNOSIS — F02B0 Dementia in other diseases classified elsewhere, moderate, without behavioral disturbance, psychotic disturbance, mood disturbance, and anxiety: Secondary | ICD-10-CM | POA: Diagnosis not present

## 2024-05-27 DIAGNOSIS — M545 Low back pain, unspecified: Secondary | ICD-10-CM | POA: Diagnosis not present

## 2024-05-27 DIAGNOSIS — M810 Age-related osteoporosis without current pathological fracture: Secondary | ICD-10-CM | POA: Diagnosis not present

## 2024-05-27 DIAGNOSIS — R41841 Cognitive communication deficit: Secondary | ICD-10-CM | POA: Diagnosis not present

## 2024-05-27 DIAGNOSIS — R2681 Unsteadiness on feet: Secondary | ICD-10-CM | POA: Diagnosis not present

## 2024-05-27 DIAGNOSIS — G301 Alzheimer's disease with late onset: Secondary | ICD-10-CM | POA: Diagnosis not present

## 2024-06-02 DIAGNOSIS — M810 Age-related osteoporosis without current pathological fracture: Secondary | ICD-10-CM | POA: Diagnosis not present

## 2024-06-02 DIAGNOSIS — M545 Low back pain, unspecified: Secondary | ICD-10-CM | POA: Diagnosis not present

## 2024-06-02 DIAGNOSIS — R41841 Cognitive communication deficit: Secondary | ICD-10-CM | POA: Diagnosis not present

## 2024-06-02 DIAGNOSIS — F02B0 Dementia in other diseases classified elsewhere, moderate, without behavioral disturbance, psychotic disturbance, mood disturbance, and anxiety: Secondary | ICD-10-CM | POA: Diagnosis not present

## 2024-06-02 DIAGNOSIS — G301 Alzheimer's disease with late onset: Secondary | ICD-10-CM | POA: Diagnosis not present

## 2024-06-02 DIAGNOSIS — R2681 Unsteadiness on feet: Secondary | ICD-10-CM | POA: Diagnosis not present

## 2024-06-03 DIAGNOSIS — F02B0 Dementia in other diseases classified elsewhere, moderate, without behavioral disturbance, psychotic disturbance, mood disturbance, and anxiety: Secondary | ICD-10-CM | POA: Diagnosis not present

## 2024-06-03 DIAGNOSIS — R41841 Cognitive communication deficit: Secondary | ICD-10-CM | POA: Diagnosis not present

## 2024-06-03 DIAGNOSIS — G301 Alzheimer's disease with late onset: Secondary | ICD-10-CM | POA: Diagnosis not present

## 2024-06-03 DIAGNOSIS — R2681 Unsteadiness on feet: Secondary | ICD-10-CM | POA: Diagnosis not present

## 2024-06-03 DIAGNOSIS — M810 Age-related osteoporosis without current pathological fracture: Secondary | ICD-10-CM | POA: Diagnosis not present

## 2024-06-03 DIAGNOSIS — M545 Low back pain, unspecified: Secondary | ICD-10-CM | POA: Diagnosis not present

## 2024-06-09 DIAGNOSIS — M810 Age-related osteoporosis without current pathological fracture: Secondary | ICD-10-CM | POA: Diagnosis not present

## 2024-06-09 DIAGNOSIS — R2681 Unsteadiness on feet: Secondary | ICD-10-CM | POA: Diagnosis not present

## 2024-06-09 DIAGNOSIS — F02B0 Dementia in other diseases classified elsewhere, moderate, without behavioral disturbance, psychotic disturbance, mood disturbance, and anxiety: Secondary | ICD-10-CM | POA: Diagnosis not present

## 2024-06-09 DIAGNOSIS — M545 Low back pain, unspecified: Secondary | ICD-10-CM | POA: Diagnosis not present

## 2024-06-09 DIAGNOSIS — G301 Alzheimer's disease with late onset: Secondary | ICD-10-CM | POA: Diagnosis not present

## 2024-06-09 DIAGNOSIS — R41841 Cognitive communication deficit: Secondary | ICD-10-CM | POA: Diagnosis not present

## 2024-06-10 DIAGNOSIS — G301 Alzheimer's disease with late onset: Secondary | ICD-10-CM | POA: Diagnosis not present

## 2024-06-10 DIAGNOSIS — M810 Age-related osteoporosis without current pathological fracture: Secondary | ICD-10-CM | POA: Diagnosis not present

## 2024-06-10 DIAGNOSIS — M545 Low back pain, unspecified: Secondary | ICD-10-CM | POA: Diagnosis not present

## 2024-06-10 DIAGNOSIS — R2681 Unsteadiness on feet: Secondary | ICD-10-CM | POA: Diagnosis not present

## 2024-06-10 DIAGNOSIS — F02B0 Dementia in other diseases classified elsewhere, moderate, without behavioral disturbance, psychotic disturbance, mood disturbance, and anxiety: Secondary | ICD-10-CM | POA: Diagnosis not present

## 2024-06-10 DIAGNOSIS — R41841 Cognitive communication deficit: Secondary | ICD-10-CM | POA: Diagnosis not present

## 2024-06-15 DIAGNOSIS — M2011 Hallux valgus (acquired), right foot: Secondary | ICD-10-CM | POA: Diagnosis not present

## 2024-06-15 DIAGNOSIS — G301 Alzheimer's disease with late onset: Secondary | ICD-10-CM | POA: Diagnosis not present

## 2024-06-15 DIAGNOSIS — M2012 Hallux valgus (acquired), left foot: Secondary | ICD-10-CM | POA: Diagnosis not present

## 2024-06-15 DIAGNOSIS — L600 Ingrowing nail: Secondary | ICD-10-CM | POA: Diagnosis not present

## 2024-06-15 DIAGNOSIS — L602 Onychogryphosis: Secondary | ICD-10-CM | POA: Diagnosis not present

## 2024-06-15 DIAGNOSIS — M545 Low back pain, unspecified: Secondary | ICD-10-CM | POA: Diagnosis not present

## 2024-06-15 DIAGNOSIS — F02B0 Dementia in other diseases classified elsewhere, moderate, without behavioral disturbance, psychotic disturbance, mood disturbance, and anxiety: Secondary | ICD-10-CM | POA: Diagnosis not present

## 2024-06-15 DIAGNOSIS — R2681 Unsteadiness on feet: Secondary | ICD-10-CM | POA: Diagnosis not present

## 2024-06-15 DIAGNOSIS — M810 Age-related osteoporosis without current pathological fracture: Secondary | ICD-10-CM | POA: Diagnosis not present

## 2024-06-15 DIAGNOSIS — R41841 Cognitive communication deficit: Secondary | ICD-10-CM | POA: Diagnosis not present

## 2024-06-16 DIAGNOSIS — M810 Age-related osteoporosis without current pathological fracture: Secondary | ICD-10-CM | POA: Diagnosis not present

## 2024-06-16 DIAGNOSIS — M545 Low back pain, unspecified: Secondary | ICD-10-CM | POA: Diagnosis not present

## 2024-06-16 DIAGNOSIS — F02B0 Dementia in other diseases classified elsewhere, moderate, without behavioral disturbance, psychotic disturbance, mood disturbance, and anxiety: Secondary | ICD-10-CM | POA: Diagnosis not present

## 2024-06-16 DIAGNOSIS — G301 Alzheimer's disease with late onset: Secondary | ICD-10-CM | POA: Diagnosis not present

## 2024-06-16 DIAGNOSIS — R41841 Cognitive communication deficit: Secondary | ICD-10-CM | POA: Diagnosis not present

## 2024-06-16 DIAGNOSIS — R2681 Unsteadiness on feet: Secondary | ICD-10-CM | POA: Diagnosis not present

## 2024-06-17 DIAGNOSIS — R2681 Unsteadiness on feet: Secondary | ICD-10-CM | POA: Diagnosis not present

## 2024-06-17 DIAGNOSIS — M810 Age-related osteoporosis without current pathological fracture: Secondary | ICD-10-CM | POA: Diagnosis not present

## 2024-06-17 DIAGNOSIS — F02B0 Dementia in other diseases classified elsewhere, moderate, without behavioral disturbance, psychotic disturbance, mood disturbance, and anxiety: Secondary | ICD-10-CM | POA: Diagnosis not present

## 2024-06-17 DIAGNOSIS — R41841 Cognitive communication deficit: Secondary | ICD-10-CM | POA: Diagnosis not present

## 2024-06-17 DIAGNOSIS — M545 Low back pain, unspecified: Secondary | ICD-10-CM | POA: Diagnosis not present

## 2024-06-17 DIAGNOSIS — G301 Alzheimer's disease with late onset: Secondary | ICD-10-CM | POA: Diagnosis not present

## 2024-06-22 ENCOUNTER — Ambulatory Visit: Admitting: Internal Medicine

## 2024-06-22 DIAGNOSIS — M810 Age-related osteoporosis without current pathological fracture: Secondary | ICD-10-CM | POA: Diagnosis not present

## 2024-06-22 DIAGNOSIS — R41841 Cognitive communication deficit: Secondary | ICD-10-CM | POA: Diagnosis not present

## 2024-06-22 DIAGNOSIS — M545 Low back pain, unspecified: Secondary | ICD-10-CM | POA: Diagnosis not present

## 2024-06-22 DIAGNOSIS — F02B0 Dementia in other diseases classified elsewhere, moderate, without behavioral disturbance, psychotic disturbance, mood disturbance, and anxiety: Secondary | ICD-10-CM | POA: Diagnosis not present

## 2024-06-22 DIAGNOSIS — G301 Alzheimer's disease with late onset: Secondary | ICD-10-CM | POA: Diagnosis not present

## 2024-06-22 DIAGNOSIS — M4854XA Collapsed vertebra, not elsewhere classified, thoracic region, initial encounter for fracture: Secondary | ICD-10-CM | POA: Diagnosis not present

## 2024-06-22 DIAGNOSIS — R2681 Unsteadiness on feet: Secondary | ICD-10-CM | POA: Diagnosis not present

## 2024-06-23 DIAGNOSIS — M545 Low back pain, unspecified: Secondary | ICD-10-CM | POA: Diagnosis not present

## 2024-06-23 DIAGNOSIS — F02B0 Dementia in other diseases classified elsewhere, moderate, without behavioral disturbance, psychotic disturbance, mood disturbance, and anxiety: Secondary | ICD-10-CM | POA: Diagnosis not present

## 2024-06-23 DIAGNOSIS — R41841 Cognitive communication deficit: Secondary | ICD-10-CM | POA: Diagnosis not present

## 2024-06-23 DIAGNOSIS — G301 Alzheimer's disease with late onset: Secondary | ICD-10-CM | POA: Diagnosis not present

## 2024-06-23 DIAGNOSIS — M810 Age-related osteoporosis without current pathological fracture: Secondary | ICD-10-CM | POA: Diagnosis not present

## 2024-06-23 DIAGNOSIS — R2681 Unsteadiness on feet: Secondary | ICD-10-CM | POA: Diagnosis not present

## 2024-06-24 ENCOUNTER — Ambulatory Visit: Payer: Self-pay

## 2024-06-24 DIAGNOSIS — R2681 Unsteadiness on feet: Secondary | ICD-10-CM | POA: Diagnosis not present

## 2024-06-24 DIAGNOSIS — M810 Age-related osteoporosis without current pathological fracture: Secondary | ICD-10-CM | POA: Diagnosis not present

## 2024-06-24 DIAGNOSIS — M545 Low back pain, unspecified: Secondary | ICD-10-CM | POA: Diagnosis not present

## 2024-06-24 DIAGNOSIS — R399 Unspecified symptoms and signs involving the genitourinary system: Secondary | ICD-10-CM

## 2024-06-24 DIAGNOSIS — G301 Alzheimer's disease with late onset: Secondary | ICD-10-CM | POA: Diagnosis not present

## 2024-06-24 DIAGNOSIS — R41841 Cognitive communication deficit: Secondary | ICD-10-CM | POA: Diagnosis not present

## 2024-06-24 DIAGNOSIS — F02B0 Dementia in other diseases classified elsewhere, moderate, without behavioral disturbance, psychotic disturbance, mood disturbance, and anxiety: Secondary | ICD-10-CM | POA: Diagnosis not present

## 2024-06-24 NOTE — Telephone Encounter (Signed)
 FYI Only or Action Required?: FYI only for provider.  Patient was last seen in primary care on 03/23/2024 by Plotnikov, [name], MD.  Called Nurse Triage reporting Dysuria.  Symptoms began several days ago.  Interventions attempted: Prescription medications: Azo and Rest, hydration, or home remedies.  Symptoms are: gradually worsening.  Triage Disposition: See Physician Within 24 Hours  Patient/caregiver understands and will follow disposition?: Yes   Copied from CRM [id number]. Topic: Clinical - Medication Question >> Jun 24, 2024 11:14 AM Jasmin G wrote: Reason for CRM: Pt has an appt with clinic on Aug 11th at 3:10 p.m, she's coming in due experiencing very dark urine and stomach problems but would like to see if it would be possible that she gets a med prescription to treat while she waits for her appt date, I did ask pt if she's experiencing any other symptoms but she said no. Please call pt back at [phone]. Reason for Disposition  Urinating more frequently than usual (i.e., frequency) OR new-onset of the feeling of an urgent need to urinate (i.e., urgency)  Answer Assessment - Initial Assessment Questions 1. SYMPTOM: What's the main symptom you're concerned about? (e.g., frequency, incontinence)     Pressure, Abdominal Pain, Dysuria  2. ONSET: When did the  symptoms  start?     Several Days Ago  3. PAIN: Is there any pain? If Yes, ask: How bad is it? (Scale: 1-10; mild, moderate, severe)     Mild to Moderate  4. CAUSE: What do you think is causing the symptoms?     Unsure  5. OTHER SYMPTOMS: Do you have any other symptoms? (e.g., blood in urine, fever, flank pain, pain with urination)     Back Pain, Abdominal Pain  6. PREGNANCY: Is there any chance you are pregnant? When was your last menstrual period?     No and No  Patient is currently in [place] with her daughter. Not in [place]   Protocols used: Urinary Symptoms-A-AH

## 2024-06-25 NOTE — Telephone Encounter (Signed)
 Can she bring a urine specimen today? Thanks

## 2024-06-25 NOTE — Telephone Encounter (Signed)
 LVM for pt if she is able to please come in today before 4:30 pm to drop off a urine sample per PCP request

## 2024-06-28 ENCOUNTER — Ambulatory Visit (HOSPITAL_BASED_OUTPATIENT_CLINIC_OR_DEPARTMENT_OTHER): Admitting: Family

## 2024-06-28 ENCOUNTER — Telehealth: Payer: Self-pay | Admitting: Internal Medicine

## 2024-06-28 ENCOUNTER — Encounter (HOSPITAL_BASED_OUTPATIENT_CLINIC_OR_DEPARTMENT_OTHER): Payer: Self-pay | Admitting: Family

## 2024-06-28 VITALS — BP 132/78 | HR 64 | Ht 60.0 in | Wt 119.0 lb

## 2024-06-28 DIAGNOSIS — R399 Unspecified symptoms and signs involving the genitourinary system: Secondary | ICD-10-CM

## 2024-06-28 DIAGNOSIS — R0989 Other specified symptoms and signs involving the circulatory and respiratory systems: Secondary | ICD-10-CM | POA: Diagnosis not present

## 2024-06-28 DIAGNOSIS — R600 Localized edema: Secondary | ICD-10-CM | POA: Diagnosis not present

## 2024-06-28 DIAGNOSIS — N390 Urinary tract infection, site not specified: Secondary | ICD-10-CM | POA: Diagnosis not present

## 2024-06-28 LAB — URINALYSIS
Bilirubin, UA: NEGATIVE
Glucose, UA: NEGATIVE
Ketones, UA: NEGATIVE
Nitrite, UA: NEGATIVE
RBC, UA: NEGATIVE
Specific Gravity, UA: 1.019 (ref 1.005–1.030)
Urobilinogen, Ur: 0.2 mg/dL (ref 0.2–1.0)
pH, UA: 6 (ref 5.0–7.5)

## 2024-06-28 NOTE — Progress Notes (Signed)
 Cardiology Office Note:  .   Date:  06/28/2024  ID:  [name], DOB 04-26-1941, MRN [id number] PCP: Teresa [name], MD  [facility] Providers Cardiologist:  Teresa Bruckner, MD    History of Present Illness: Teresa   KALISTA Stein is a 83 y.o. female  hx of HTN, CAD (nonobstructive by cath 2018), HLD, PAF during hospitalization 2017   Prior patient of Dr. Mady.  Established with Dr. Bruckner 07/19/2019.  Longstanding history of labile blood pressure and orthostatic hypotension. Losartan  previously transitioned to Olmesartan  20mg . She has had persistent GI upset after her first meal despite adjustment in medication timings and no symptoms after later meals. 24h BP cuff 06/03/23 with average BP 152/59 (daytime 155/71 and nighttime 137/60) with range 73/43-213/95. At most recent visit 09/26/23 with Dr. Bruckner Amlodipine  2.5mg  was added.   ED visit 10/09/23 with mechanical fall after her [name] got caught in the door.  Left elbow had olecranon fracture.  On 11/10/2023 underwent ORIF of left elbow/olecranon. At visits 11/2023 and 03/2024 she was doing overall well.   Presents today for follow up. Since last seen has completed physical therapy. She is working with a Human resources officer related ot memory concerns. Resides at [facility]. Has not been checking BP at home as has felt well. BP has been well controlled at recent clinic visits. No lightheadedness, chest pain, worsened exertional dyspnea. She does note some epigastric pain which improves with stretching and laying down, reassurance provided typical for angina. She contacted her PCP noting some UTI symptoms and is getting urinalysis today.   ROS: Please see the history of present illness.    All other systems reviewed and are negative.   Studies Reviewed: .         Cardiac Studies & Procedures   ______________________________________________________________________________________________ CARDIAC  CATHETERIZATION  CARDIAC CATHETERIZATION 04/07/2017  Conclusion Conclusions: 1. Minimal coronary artery disease involving the large epicardial coronary arteries. Mild to moderate stenosis of diagonal branches is evident. 2. Normal left ventricular contraction. 3. Normal left ventricular systolic function. 4. Small right radial artery.  Recommendations: 1. Continue medical therapy and risk factor modification.  Stein Mady, MD [facility] Pager: [phone]  Findings Coronary Findings Diagnostic  Dominance: Right  Left Main Vessel is large. Vessel is angiographically normal.  Left Anterior Descending Vessel is large. The vessel exhibits minimal luminal irregularities.  First Diagonal Branch Vessel is moderate in size.  Second Diagonal Branch Vessel is small in size.  Third Diagonal Branch Vessel is small in size.  Left Circumflex Vessel is large. Vessel is angiographically normal.  First Obtuse Marginal Branch Vessel is small in size.  Second Obtuse Marginal Branch Vessel is moderate in size.  Third Obtuse Marginal Branch Vessel is moderate in size.  Right Coronary Artery Vessel is large. Vessel is angiographically normal.  Right Posterior Descending Artery Vessel is moderate in size.  Right Posterior Atrioventricular Artery Vessel is moderate in size.  Intervention  No interventions have been documented.   STRESS TESTS  MYOCARDIAL PERFUSION IMAGING 03/24/2017  Interpretation Summary  Nuclear stress EF: 67%.  There was no ST segment deviation noted during stress.  There is a small defect of mild severity present in the basal inferoseptal and mid inferoseptal location. The defect is partially reversible and could represent variations in diaphragmatic attenuation vs. ischemia.  There is a small defect of moderate severity present in the apex location. The defect is reversible and partially reversible.  There is a medium defect of  moderate severity present in the basal anteroseptal, mid anteroseptal and apical septal location. The defect is reversible and worrisome for ischemia.  The left ventricular ejection fraction is hyperdynamic (>65%).  This is a high risk study.   ECHOCARDIOGRAM  ECHOCARDIOGRAM COMPLETE 04/17/2021  Narrative ECHOCARDIOGRAM REPORT    Patient Name:   [name] Date of Exam: 04/17/2021 Medical Rec #:  [id number]      Height:       59.0 in Accession #:    [id number]     Weight:       111.0 lb Date of Birth:  [birth year]      BSA:          1.436 m Patient Age:    80 years       BP:           144/86 mmHg Patient Gender: F              HR:           62 bpm. Exam Location:  [address]  Procedure: 2D Echo, Cardiac Doppler and Color Doppler  Indications:    I10 Hypertension  History:        Patient has prior history of Echocardiogram examinations, most recent 01/22/2016. Risk Factors:Hypertension and Dyslipidemia. Chronic kidney disease. Paroxysmal atrial fibrillation. Fatigue. Scoliosis. Murmur. Chest pain.  Sonographer:    [name] RDCS Referring Phys: [id number] [name]  IMPRESSIONS   1. Left ventricular ejection fraction, by estimation, is 60 to 65%. The left ventricle has normal function. The left ventricle has no regional wall motion abnormalities. There is severe asymmetric left ventricular hypertrophy of the basal-septal segment. Left ventricular diastolic parameters are consistent with Grade I diastolic dysfunction (impaired relaxation). 2. Right ventricular systolic function is normal. The right ventricular size is normal. There is normal pulmonary artery systolic pressure. The estimated right ventricular systolic pressure is 32.4 mmHg. 3. Left atrial size was mildly dilated. 4. The mitral valve is abnormal. Trivial mitral valve regurgitation. 5. The aortic valve is tricuspid. Aortic valve regurgitation is mild. Mild to moderate aortic valve  sclerosis/calcification is present, without any evidence of aortic stenosis. Aortic regurgitation PHT measures 517 msec. 6. The inferior vena cava is dilated in size with >50% respiratory variability, suggesting right atrial pressure of 8 mmHg.  Comparison(s): Changes from prior study are noted. 01/22/2016: LVEF 55-60%, mild LAE, RVSP 49 mmHg.  FINDINGS Left Ventricle: Left ventricular ejection fraction, by estimation, is 60 to 65%. The left ventricle has normal function. The left ventricle has no regional wall motion abnormalities. The left ventricular internal cavity size was normal in size. There is severe asymmetric left ventricular hypertrophy of the basal-septal segment. Left ventricular diastolic parameters are consistent with Grade I diastolic dysfunction (impaired relaxation). Indeterminate filling pressures.  Right Ventricle: The right ventricular size is normal. No increase in right ventricular wall thickness. Right ventricular systolic function is normal. There is normal pulmonary artery systolic pressure. The tricuspid regurgitant velocity is 2.47 m/s, and with an assumed right atrial pressure of 8 mmHg, the estimated right ventricular systolic pressure is 32.4 mmHg.  Left Atrium: Left atrial size was mildly dilated.  Right Atrium: Right atrial size was normal in size.  Pericardium: There is no evidence of pericardial effusion.  Mitral Valve: The mitral valve is abnormal. There is mild thickening of the mitral valve leaflet(s). Trivial mitral valve regurgitation.  Tricuspid Valve: The tricuspid valve is grossly normal. Tricuspid valve regurgitation is mild.  Aortic Valve:  The aortic valve is tricuspid. Aortic valve regurgitation is mild. Aortic regurgitation PHT measures 517 msec. Mild to moderate aortic valve sclerosis/calcification is present, without any evidence of aortic stenosis.  Pulmonic Valve: The pulmonic valve was grossly normal. Pulmonic valve regurgitation is  trivial.  Aorta: The aortic root and ascending aorta are structurally normal, with no evidence of dilitation.  Venous: The inferior vena cava is dilated in size with greater than 50% respiratory variability, suggesting right atrial pressure of 8 mmHg.  IAS/Shunts: No atrial level shunt detected by color flow Doppler.   LEFT VENTRICLE PLAX 2D LVIDd:         2.90 cm  Diastology LVIDs:         1.90 cm  LV e' medial:    4.57 cm/s LV PW:         1.30 cm  LV E/e' medial:  14.8 LV IVS:        1.60 cm  LV e' lateral:   6.74 cm/s LVOT diam:     2.10 cm  LV E/e' lateral: 10.0 LV SV:         78 LV SV Index:   55 LVOT Area:     3.46 cm   RIGHT VENTRICLE             IVC RV Basal diam:  3.60 cm     IVC diam: 2.10 cm RV S prime:     16.00 cm/s TAPSE (M-mode): 2.0 cm  LEFT ATRIUM             Index       RIGHT ATRIUM           Index LA diam:        3.70 cm 2.58 cm/m  RA Area:     11.40 cm LA Vol (A2C):   38.4 ml 26.74 ml/m RA Volume:   27.30 ml  19.01 ml/m LA Vol (A4C):   31.1 ml 21.66 ml/m LA Biplane Vol: 36.3 ml 25.28 ml/m AORTIC VALVE LVOT Vmax:   108.50 cm/s LVOT Vmean:  66.400 cm/s LVOT VTI:    0.226 m AI PHT:      517 msec  AORTA Ao Root diam: 3.50 cm Ao Asc diam:  3.90 cm  MITRAL VALVE               TRICUSPID VALVE MV Area (PHT): 3.03 cm    TR Peak grad:   24.4 mmHg MV Decel Time: 250 msec    TR Vmax:        247.00 cm/s MV E velocity: 67.70 cm/s MV A velocity: 67.70 cm/s  SHUNTS MV E/A ratio:  1.00        Systemic VTI:  0.23 m Systemic Diam: 2.10 cm  Vinie Maxcy MD Electronically signed by Vinie Maxcy MD Signature Date/Time: 04/17/2021/1:55:32 PM    Final    MONITORS  CARDIAC EVENT MONITOR 01/23/2016  Narrative NSR with PACs.  No atrial fibrillation.       ______________________________________________________________________________________________      Risk Assessment/Calculations:             Physical Exam:   VS:  BP 132/78   Pulse 64    Ht 5' (1.524 m)   Wt 119 lb (54 kg)   SpO2 97%   BMI 23.24 kg/m    Wt Readings from Last 3 Encounters:  06/28/24 119 lb (54 kg)  05/24/24 117 lb 8 oz (53.3 kg)  03/24/24 116 lb 1.6 oz (52.7 kg)  GEN: thin, well developed in no acute distress. Ambulates with rolling Abiageal Blowe NECK: No JVD; No carotid bruits CARDIAC: RRR, no murmurs, rubs, gallops RESPIRATORY:  Clear to auscultation without rales, wheezing or rhonchi  ABDOMEN: Soft, non-tender, non-distended EXTREMITIES:  Nonpitting pretibial edema; No deformity   ASSESSMENT AND PLAN: .    Labile hypertension - Longstanding issue. BP overall controlled by recent clinic readings. Reasonable BP goal <140/90 given her labile BP and symptoms with relative hypotension. Previously significant elevations with anxiety which has been better controlled recently. Continue Olmesartan  20mg  BID, Amlodipine  5mg  daily.  Heart healthy diet and regular cardiovascular exercise encouraged. CMET, CBC for routine monitoring today  Trace bilateral pretibial edema - improved with compression stockings. Leg elevation recommended. BNP today  Anxiety - Managed by her primary care team.   UTI symptoms - urinalysis and urine culture today due to UTI symptoms to prevent her from having to make second trip to her PCP today      Dispo: follow up in 6 mos  Signed, Reche GORMAN Finder, NP

## 2024-06-28 NOTE — Telephone Encounter (Signed)
 Pt states they are unable to come in today but will be here for their appt on 06/29/24.

## 2024-06-28 NOTE — Patient Instructions (Signed)
 Medication Instructions:  Continue your current medications.   *If you need a refill on your cardiac medications before your next appointment, please call your pharmacy*  Lab Work: Your physician recommends that you return for lab work today: CBC, CMET, and urinalysis/urine culture If you have labs (blood work) drawn today and your tests are completely normal, you will receive your results only by: MyChart Message (if you have MyChart) OR A paper copy in the mail If you have any lab test that is abnormal or we need to change your treatment, we will call you to review the results.  Follow-Up: At [facility], you and your health needs are our priority.  As part of our continuing mission to provide you with exceptional heart care, our providers are all part of one team.  This team includes your primary Cardiologist (physician) and Advanced Practice Providers or APPs (Physician Assistants and Nurse Practitioners) who all work together to provide you with the care you need, when you need it.  Your next appointment:   6 month(s)  Provider:   Shelda Bruckner, MD, Rosaline Bane, NP, or Reche Finder, NP    We recommend signing up for the patient portal called MyChart.  Sign up information is provided on this After Visit Summary.  MyChart is used to connect with patients for Virtual Visits (Telemedicine).  Patients are able to view lab/test results, encounter notes, upcoming appointments, etc.  Non-urgent messages can be sent to your provider as well.   To learn more about what you can do with MyChart, go to [email or web address].   Other Instructions

## 2024-06-29 ENCOUNTER — Ambulatory Visit (INDEPENDENT_AMBULATORY_CARE_PROVIDER_SITE_OTHER): Admitting: Internal Medicine

## 2024-06-29 ENCOUNTER — Encounter: Payer: Self-pay | Admitting: Internal Medicine

## 2024-06-29 ENCOUNTER — Ambulatory Visit (HOSPITAL_BASED_OUTPATIENT_CLINIC_OR_DEPARTMENT_OTHER): Payer: Self-pay | Admitting: Family

## 2024-06-29 VITALS — BP 127/72 | HR 64 | Temp 97.3°F | Ht 60.0 in | Wt 119.0 lb

## 2024-06-29 DIAGNOSIS — K5790 Diverticulosis of intestine, part unspecified, without perforation or abscess without bleeding: Secondary | ICD-10-CM | POA: Insufficient documentation

## 2024-06-29 DIAGNOSIS — N132 Hydronephrosis with renal and ureteral calculous obstruction: Secondary | ICD-10-CM | POA: Insufficient documentation

## 2024-06-29 DIAGNOSIS — I700 Atherosclerosis of aorta: Secondary | ICD-10-CM | POA: Insufficient documentation

## 2024-06-29 DIAGNOSIS — R350 Frequency of micturition: Secondary | ICD-10-CM | POA: Insufficient documentation

## 2024-06-29 DIAGNOSIS — F02B0 Dementia in other diseases classified elsewhere, moderate, without behavioral disturbance, psychotic disturbance, mood disturbance, and anxiety: Secondary | ICD-10-CM | POA: Diagnosis not present

## 2024-06-29 DIAGNOSIS — R2681 Unsteadiness on feet: Secondary | ICD-10-CM | POA: Diagnosis not present

## 2024-06-29 DIAGNOSIS — R202 Paresthesia of skin: Secondary | ICD-10-CM | POA: Diagnosis not present

## 2024-06-29 DIAGNOSIS — R609 Edema, unspecified: Secondary | ICD-10-CM

## 2024-06-29 DIAGNOSIS — E559 Vitamin D deficiency, unspecified: Secondary | ICD-10-CM | POA: Diagnosis not present

## 2024-06-29 DIAGNOSIS — G301 Alzheimer's disease with late onset: Secondary | ICD-10-CM | POA: Diagnosis not present

## 2024-06-29 DIAGNOSIS — E7800 Pure hypercholesterolemia, unspecified: Secondary | ICD-10-CM | POA: Insufficient documentation

## 2024-06-29 DIAGNOSIS — E785 Hyperlipidemia, unspecified: Secondary | ICD-10-CM | POA: Diagnosis not present

## 2024-06-29 DIAGNOSIS — R41841 Cognitive communication deficit: Secondary | ICD-10-CM | POA: Diagnosis not present

## 2024-06-29 DIAGNOSIS — F411 Generalized anxiety disorder: Secondary | ICD-10-CM | POA: Diagnosis not present

## 2024-06-29 DIAGNOSIS — F341 Dysthymic disorder: Secondary | ICD-10-CM | POA: Insufficient documentation

## 2024-06-29 DIAGNOSIS — M810 Age-related osteoporosis without current pathological fracture: Secondary | ICD-10-CM | POA: Diagnosis not present

## 2024-06-29 DIAGNOSIS — H8110 Benign paroxysmal vertigo, unspecified ear: Secondary | ICD-10-CM | POA: Insufficient documentation

## 2024-06-29 DIAGNOSIS — N200 Calculus of kidney: Secondary | ICD-10-CM | POA: Insufficient documentation

## 2024-06-29 DIAGNOSIS — G4700 Insomnia, unspecified: Secondary | ICD-10-CM | POA: Insufficient documentation

## 2024-06-29 DIAGNOSIS — N3281 Overactive bladder: Secondary | ICD-10-CM | POA: Insufficient documentation

## 2024-06-29 DIAGNOSIS — K5901 Slow transit constipation: Secondary | ICD-10-CM

## 2024-06-29 DIAGNOSIS — M545 Low back pain, unspecified: Secondary | ICD-10-CM | POA: Diagnosis not present

## 2024-06-29 DIAGNOSIS — D126 Benign neoplasm of colon, unspecified: Secondary | ICD-10-CM | POA: Insufficient documentation

## 2024-06-29 LAB — URINE CULTURE

## 2024-06-29 MED ORDER — AMLODIPINE BESYLATE 2.5 MG PO TABS: 2.5000 mg | ORAL_TABLET | Freq: Every day | ORAL | 11 refills | Status: DC

## 2024-06-29 NOTE — Assessment & Plan Note (Signed)
 Continue with Gemtesa.  Treat chronic constipation and occ stool incontinence (blowouts) once every couple weeks. Take Miralax  small ammount daily.  May John will have to find a better rhythm and the laxative use Try prunes, Laxative tea - Smooth Move

## 2024-06-29 NOTE — Progress Notes (Signed)
 Subjective:  Patient ID: [name], female    DOB: [birth year]  Age: 83 y.o. MRN: [id number]  CC: Medical Management of Chronic Issues (3 MNTH F.U .. Pt stated she is concerned about UA results, labs and is questioning if she still needs the Prolia  injection. Pt states that the pain in her back and side is nt due to bone pain and may be from constipation.)   HPI [name] presents for anxiety, tremors - Lorazepam  is very helpful C/o leg swelling - worse (on amlodipine ), swelling starts in the morning F/u on urinary urgency C/o constipation and occ stool incontinence (blowouts) once every couple weeks.  Outpatient Medications Prior to Visit  Medication Sig Dispense Refill   b complex vitamins capsule Take 1 capsule by mouth daily.     Cholecalciferol (VITAMIN D3) 125 MCG (5000 UT) CAPS Take 1 capsule (5,000 Units total) by mouth daily.     denosumab  (PROLIA ) 60 MG/ML SOSY injection Inject 60 mg into the skin every 6 (six) months.     Ibuprofen  (ADVIL  PO) Take by mouth.     LORazepam  (ATIVAN ) 0.5 MG tablet Take 1 tablet (0.5 mg total) by mouth 2 (two) times daily as needed for anxiety. for anxiety 60 tablet 5   olmesartan  (BENICAR ) 20 MG tablet Take 1 tablet (20 mg total) by mouth 2 (two) times daily. 180 tablet 3   polyethylene glycol (MIRALAX  / GLYCOLAX ) 17 g packet Take 17 g by mouth daily as needed for mild constipation.     polyethylene glycol powder (GLYCOLAX /MIRALAX ) 17 GM/SCOOP powder Take 17 g by mouth daily.     Vibegron (GEMTESA PO) Take by mouth. Urinary incontinence     amLODipine  (NORVASC ) 5 MG tablet Take 1 tablet (5 mg total) by mouth daily. 30 tablet 11   No facility-administered medications prior to visit.    ROS: Review of Systems  Constitutional:  Positive for fatigue. Negative for activity change, appetite change, chills and unexpected weight change.  HENT:  Negative for congestion, mouth sores and sinus pressure.   Eyes:  Negative for visual disturbance.   Respiratory:  Negative for cough and chest tightness.   Cardiovascular:  Positive for leg swelling.  Gastrointestinal:  Negative for abdominal pain and nausea.  Genitourinary:  Negative for difficulty urinating, frequency and vaginal pain.  Musculoskeletal:  Positive for gait problem. Negative for back pain.  Skin:  Negative for pallor and rash.  Neurological:  Positive for tremors. Negative for dizziness, weakness, numbness and headaches.  Psychiatric/Behavioral:  Negative for confusion and sleep disturbance.     Objective:  BP 127/72   Pulse 64   Temp (!) 97.3 F (36.3 C) (Oral)   Ht 5' (1.524 m)   Wt 119 lb (54 kg)   SpO2 99%   BMI 23.24 kg/m   BP Readings from Last 3 Encounters:  06/29/24 127/72  06/28/24 132/78  05/24/24 128/66    Wt Readings from Last 3 Encounters:  06/29/24 119 lb (54 kg)  06/28/24 119 lb (54 kg)  05/24/24 117 lb 8 oz (53.3 kg)    Physical Exam Constitutional:      General: She is not in acute distress.    Appearance: She is well-developed.  HENT:     Head: Normocephalic.     Right Ear: External ear normal.     Left Ear: External ear normal.     Nose: Nose normal.  Eyes:     General:  Right eye: No discharge.        Left eye: No discharge.     Conjunctiva/sclera: Conjunctivae normal.     Pupils: Pupils are equal, round, and reactive to light.  Neck:     Thyroid : No thyromegaly.     Vascular: No JVD.     Trachea: No tracheal deviation.  Cardiovascular:     Rate and Rhythm: Normal rate and regular rhythm.     Heart sounds: Normal heart sounds.  Pulmonary:     Effort: No respiratory distress.     Breath sounds: No stridor. No wheezing.  Abdominal:     General: Bowel sounds are normal. There is no distension.     Palpations: Abdomen is soft. There is no mass.     Tenderness: There is no abdominal tenderness. There is no guarding or rebound.  Musculoskeletal:        General: No tenderness.     Cervical back: Normal range of  motion and neck supple. No rigidity.     Right lower leg: Edema present.     Left lower leg: Edema present.  Lymphadenopathy:     Cervical: No cervical adenopathy.  Skin:    Findings: No erythema or rash.  Neurological:     Mental Status: She is oriented to person, place, and time. Mental status is at baseline.     Cranial Nerves: No cranial nerve deficit.     Motor: No abnormal muscle tone.     Coordination: Coordination normal.     Deep Tendon Reflexes: Reflexes normal.  Psychiatric:        Behavior: Behavior normal.        Thought Content: Thought content normal.        Judgment: Judgment normal.   Trace to 1+ edema B feet Unsteady gait  Lab Results  Component Value Date   WBC 6.2 12/23/2023   HGB 13.6 12/23/2023   HCT 41.0 12/23/2023   PLT 217.0 12/23/2023   GLUCOSE 99 12/23/2023   CHOL 132 01/25/2015   TRIG 64 01/25/2015   HDL 68 01/25/2015   LDLDIRECT 156.3 03/28/2011   LDLCALC 51 01/25/2015   ALT 15 12/23/2023   AST 18 12/23/2023   NA 140 12/23/2023   K 4.1 12/23/2023   CL 105 12/23/2023   CREATININE 0.77 12/23/2023   BUN 19 12/23/2023   CO2 27 12/23/2023   TSH 1.26 12/23/2023   INR 1.0 04/01/2017    No results found.  Assessment & Plan:   Problem List Items Addressed This Visit     Anxiety disorder   Constipation   Chronic constipation and occ stool incontinence (blowouts) once every couple weeks. Take Miralax  small ammount daily.  May John will have to find a better rhythm and the laxative use Try prunes, Laxative tea - Smooth Move      Relevant Medications   amLODipine  (NORVASC ) 2.5 MG tablet   Other Relevant Orders   TSH   Urinalysis   CBC with Differential/Platelet   Lipid panel   Comprehensive metabolic panel with GFR   Vitamin B12   VITAMIN D  25 Hydroxy (Vit-D Deficiency, Fractures)   T4, free   Dyslipidemia   Relevant Orders   TSH   Lipid panel   T4, free   Edema - Primary   B ankles - worse Use compression socks Reduce  Amlodipine  to 2.5 mg/d      Relevant Medications   amLODipine  (NORVASC ) 2.5 MG tablet   Other Relevant Orders  TSH   Urinalysis   CBC with Differential/Platelet   Lipid panel   Comprehensive metabolic panel with GFR   Vitamin B12   VITAMIN D  25 Hydroxy (Vit-D Deficiency, Fractures)   T4, free   Overactive bladder   Continue with Gemtesa.  Treat chronic constipation and occ stool incontinence (blowouts) once every couple weeks. Take Miralax  small ammount daily.  May John will have to find a better rhythm and the laxative use Try prunes, Laxative tea - Smooth Move      Vitamin D  deficiency   Relevant Orders   VITAMIN D  25 Hydroxy (Vit-D Deficiency, Fractures)   Other Visit Diagnoses       Paresthesia       Relevant Orders   TSH   Vitamin B12         Meds ordered this encounter  Medications   amLODipine  (NORVASC ) 2.5 MG tablet    Sig: Take 1 tablet (2.5 mg total) by mouth daily.    Dispense:  30 tablet    Refill:  11      Follow-up: Return in about 3 months (around 09/29/2024) for a follow-up visit.  Marolyn Noel, MD

## 2024-06-29 NOTE — Patient Instructions (Addendum)
 Take Miralax  small ammount Try prunes, Laxative tea - Smooth Move

## 2024-06-29 NOTE — Assessment & Plan Note (Signed)
 B ankles - worse Use compression socks Reduce Amlodipine  to 2.5 mg/d

## 2024-06-29 NOTE — Assessment & Plan Note (Addendum)
 Chronic constipation and occ stool incontinence (blowouts) once every couple weeks. Take Miralax  small ammount daily.  May John will have to find a better rhythm and the laxative use Try prunes, Laxative tea - Smooth Move

## 2024-06-30 ENCOUNTER — Ambulatory Visit: Payer: Self-pay

## 2024-06-30 ENCOUNTER — Ambulatory Visit: Admitting: Gastroenterology

## 2024-06-30 DIAGNOSIS — M545 Low back pain, unspecified: Secondary | ICD-10-CM | POA: Diagnosis not present

## 2024-06-30 DIAGNOSIS — M810 Age-related osteoporosis without current pathological fracture: Secondary | ICD-10-CM | POA: Diagnosis not present

## 2024-06-30 DIAGNOSIS — R41841 Cognitive communication deficit: Secondary | ICD-10-CM | POA: Diagnosis not present

## 2024-06-30 DIAGNOSIS — F02B0 Dementia in other diseases classified elsewhere, moderate, without behavioral disturbance, psychotic disturbance, mood disturbance, and anxiety: Secondary | ICD-10-CM | POA: Diagnosis not present

## 2024-06-30 DIAGNOSIS — R2681 Unsteadiness on feet: Secondary | ICD-10-CM | POA: Diagnosis not present

## 2024-06-30 DIAGNOSIS — G301 Alzheimer's disease with late onset: Secondary | ICD-10-CM | POA: Diagnosis not present

## 2024-06-30 LAB — CBC
Hematocrit: 41.4 % (ref 34.0–46.6)
Hemoglobin: 13.8 g/dL (ref 11.1–15.9)
MCH: 31.9 pg (ref 26.6–33.0)
MCHC: 33.3 g/dL (ref 31.5–35.7)
MCV: 96 fL (ref 79–97)
Platelets: 177 x10E3/uL (ref 150–450)
RBC: 4.33 x10E6/uL (ref 3.77–5.28)
RDW: 12.4 % (ref 11.7–15.4)
WBC: 5.7 x10E3/uL (ref 3.4–10.8)

## 2024-06-30 LAB — COMPREHENSIVE METABOLIC PANEL WITH GFR
ALT: 13 IU/L (ref 0–32)
AST: 25 IU/L (ref 0–40)
Albumin: 4.5 g/dL (ref 3.7–4.7)
Alkaline Phosphatase: 63 IU/L (ref 44–121)
BUN/Creatinine Ratio: 23 (ref 12–28)
BUN: 21 mg/dL (ref 8–27)
Bilirubin Total: 0.9 mg/dL (ref 0.0–1.2)
CO2: 18 mmol/L — ABNORMAL LOW (ref 20–29)
Calcium: 10.3 mg/dL (ref 8.7–10.3)
Chloride: 105 mmol/L (ref 96–106)
Creatinine, Ser: 0.9 mg/dL (ref 0.57–1.00)
Globulin, Total: 2.3 g/dL (ref 1.5–4.5)
Glucose: 90 mg/dL (ref 70–99)
Potassium: 4.7 mmol/L (ref 3.5–5.2)
Sodium: 141 mmol/L (ref 134–144)
Total Protein: 6.8 g/dL (ref 6.0–8.5)
eGFR: 63 mL/min/1.73 (ref >59)

## 2024-06-30 LAB — BRAIN NATRIURETIC PEPTIDE: BNP: 153.8 pg/mL — ABNORMAL HIGH (ref 0.0–100.0)

## 2024-06-30 NOTE — Telephone Encounter (Signed)
 FYI Only or Action Required?: FYI only for provider.  Patient was last seen in primary care on 06/29/2024 by Plotnikov, [name], MD.  Called Nurse Triage reporting Medication Problem.  Symptoms began yesterday.  Interventions attempted: Other: Patient in office yesterday.  Symptoms are: stable.  Triage Disposition: Information or Advice Only Call  Patient/caregiver understands and will follow disposition?: Yes  Copied from CRM [id number]. Topic: Clinical - Medication Question >> Jun 30, 2024  9:14 AM Tonda B wrote: Reason for CRM: patient said that she was seen on 06/29/24 and was told she has a uti she would like to get a rx for that please call pt back  [phone] Reason for Disposition  Health information question, no triage required and triager able to answer question  Answer Assessment - Initial Assessment Questions 1. REASON FOR CALL: What is the main reason for your call? or How can I best help you?     Patient stated she was in the office yesterday and thought she was to receive prescription for UTI. This RN informed pt of doctors note from this morning stating that no antibiotic was needed at this time. Educated pt to call back if any worsening signs or symptoms.  2. SYMPTOMS : Do you have any symptoms?      Patient stated urine was really yellow  Protocols used: Information Only Call - No Triage-A-AH

## 2024-07-01 DIAGNOSIS — M545 Low back pain, unspecified: Secondary | ICD-10-CM | POA: Diagnosis not present

## 2024-07-01 DIAGNOSIS — F02B0 Dementia in other diseases classified elsewhere, moderate, without behavioral disturbance, psychotic disturbance, mood disturbance, and anxiety: Secondary | ICD-10-CM | POA: Diagnosis not present

## 2024-07-01 DIAGNOSIS — R41841 Cognitive communication deficit: Secondary | ICD-10-CM | POA: Diagnosis not present

## 2024-07-01 DIAGNOSIS — G301 Alzheimer's disease with late onset: Secondary | ICD-10-CM | POA: Diagnosis not present

## 2024-07-01 DIAGNOSIS — R2681 Unsteadiness on feet: Secondary | ICD-10-CM | POA: Diagnosis not present

## 2024-07-01 DIAGNOSIS — M810 Age-related osteoporosis without current pathological fracture: Secondary | ICD-10-CM | POA: Diagnosis not present

## 2024-07-01 MED ORDER — FUROSEMIDE 20 MG PO TABS: ORAL_TABLET | ORAL | 1 refills | Status: DC

## 2024-07-01 NOTE — Telephone Encounter (Signed)
 The patient has been notified of the result and verbalized understanding.  All questions (if any) were answered.  Pt aware to start taking lasix  20 mg po daily for 3 days only, then as needed once per week for swelling thereafter.   Confirmed the pharmacy of choice with the pt.   Pt verbalized understanding and agrees with this plan.

## 2024-07-01 NOTE — Telephone Encounter (Signed)
 Her urine culture was negative.  No need for an antibiotic.  Thank you

## 2024-07-01 NOTE — Telephone Encounter (Signed)
-----   Message from Reche GORMAN Finder sent at 07/01/2024  8:09 AM EDT ----- Normal kidneys, liver, electrolytes.  CBC no anemia nor infection.  BNP elevated indicating some volume overload.  Recommend Lasix  20 mg daily for 3 days then as needed once per week for swelling.   Please send Rx for 10 tablets with 1 refill. ----- Message ----- From: Interface, [facility] Lab Results In Sent: 06/28/2024  10:35 PM EDT To: Reche GORMAN Finder, NP

## 2024-07-01 NOTE — Telephone Encounter (Signed)
 Tried to reach pt in regards to providers response as follows Her urine culture was negative.  No need for an antibiotic.  Thank you

## 2024-07-06 DIAGNOSIS — G301 Alzheimer's disease with late onset: Secondary | ICD-10-CM | POA: Diagnosis not present

## 2024-07-06 DIAGNOSIS — M545 Low back pain, unspecified: Secondary | ICD-10-CM | POA: Diagnosis not present

## 2024-07-06 DIAGNOSIS — R2681 Unsteadiness on feet: Secondary | ICD-10-CM | POA: Diagnosis not present

## 2024-07-06 DIAGNOSIS — R41841 Cognitive communication deficit: Secondary | ICD-10-CM | POA: Diagnosis not present

## 2024-07-06 DIAGNOSIS — F02B0 Dementia in other diseases classified elsewhere, moderate, without behavioral disturbance, psychotic disturbance, mood disturbance, and anxiety: Secondary | ICD-10-CM | POA: Diagnosis not present

## 2024-07-06 DIAGNOSIS — M810 Age-related osteoporosis without current pathological fracture: Secondary | ICD-10-CM | POA: Diagnosis not present

## 2024-07-07 DIAGNOSIS — F02B0 Dementia in other diseases classified elsewhere, moderate, without behavioral disturbance, psychotic disturbance, mood disturbance, and anxiety: Secondary | ICD-10-CM | POA: Diagnosis not present

## 2024-07-07 DIAGNOSIS — M545 Low back pain, unspecified: Secondary | ICD-10-CM | POA: Diagnosis not present

## 2024-07-07 DIAGNOSIS — R41841 Cognitive communication deficit: Secondary | ICD-10-CM | POA: Diagnosis not present

## 2024-07-07 DIAGNOSIS — R2681 Unsteadiness on feet: Secondary | ICD-10-CM | POA: Diagnosis not present

## 2024-07-07 DIAGNOSIS — M810 Age-related osteoporosis without current pathological fracture: Secondary | ICD-10-CM | POA: Diagnosis not present

## 2024-07-07 DIAGNOSIS — G301 Alzheimer's disease with late onset: Secondary | ICD-10-CM | POA: Diagnosis not present

## 2024-07-08 DIAGNOSIS — M810 Age-related osteoporosis without current pathological fracture: Secondary | ICD-10-CM | POA: Diagnosis not present

## 2024-07-08 DIAGNOSIS — M545 Low back pain, unspecified: Secondary | ICD-10-CM | POA: Diagnosis not present

## 2024-07-08 DIAGNOSIS — G301 Alzheimer's disease with late onset: Secondary | ICD-10-CM | POA: Diagnosis not present

## 2024-07-08 DIAGNOSIS — F02B0 Dementia in other diseases classified elsewhere, moderate, without behavioral disturbance, psychotic disturbance, mood disturbance, and anxiety: Secondary | ICD-10-CM | POA: Diagnosis not present

## 2024-07-08 DIAGNOSIS — R2681 Unsteadiness on feet: Secondary | ICD-10-CM | POA: Diagnosis not present

## 2024-07-08 DIAGNOSIS — R41841 Cognitive communication deficit: Secondary | ICD-10-CM | POA: Diagnosis not present

## 2024-07-13 DIAGNOSIS — R2681 Unsteadiness on feet: Secondary | ICD-10-CM | POA: Diagnosis not present

## 2024-07-13 DIAGNOSIS — M545 Low back pain, unspecified: Secondary | ICD-10-CM | POA: Diagnosis not present

## 2024-07-13 DIAGNOSIS — R41841 Cognitive communication deficit: Secondary | ICD-10-CM | POA: Diagnosis not present

## 2024-07-13 DIAGNOSIS — M810 Age-related osteoporosis without current pathological fracture: Secondary | ICD-10-CM | POA: Diagnosis not present

## 2024-07-13 DIAGNOSIS — G301 Alzheimer's disease with late onset: Secondary | ICD-10-CM | POA: Diagnosis not present

## 2024-07-13 DIAGNOSIS — F02B0 Dementia in other diseases classified elsewhere, moderate, without behavioral disturbance, psychotic disturbance, mood disturbance, and anxiety: Secondary | ICD-10-CM | POA: Diagnosis not present

## 2024-07-14 DIAGNOSIS — M545 Low back pain, unspecified: Secondary | ICD-10-CM | POA: Diagnosis not present

## 2024-07-14 DIAGNOSIS — G301 Alzheimer's disease with late onset: Secondary | ICD-10-CM | POA: Diagnosis not present

## 2024-07-14 DIAGNOSIS — R41841 Cognitive communication deficit: Secondary | ICD-10-CM | POA: Diagnosis not present

## 2024-07-14 DIAGNOSIS — F02B0 Dementia in other diseases classified elsewhere, moderate, without behavioral disturbance, psychotic disturbance, mood disturbance, and anxiety: Secondary | ICD-10-CM | POA: Diagnosis not present

## 2024-07-14 DIAGNOSIS — M810 Age-related osteoporosis without current pathological fracture: Secondary | ICD-10-CM | POA: Diagnosis not present

## 2024-07-14 DIAGNOSIS — R2681 Unsteadiness on feet: Secondary | ICD-10-CM | POA: Diagnosis not present

## 2024-07-15 DIAGNOSIS — M545 Low back pain, unspecified: Secondary | ICD-10-CM | POA: Diagnosis not present

## 2024-07-15 DIAGNOSIS — G301 Alzheimer's disease with late onset: Secondary | ICD-10-CM | POA: Diagnosis not present

## 2024-07-15 DIAGNOSIS — M810 Age-related osteoporosis without current pathological fracture: Secondary | ICD-10-CM | POA: Diagnosis not present

## 2024-07-15 DIAGNOSIS — F02B0 Dementia in other diseases classified elsewhere, moderate, without behavioral disturbance, psychotic disturbance, mood disturbance, and anxiety: Secondary | ICD-10-CM | POA: Diagnosis not present

## 2024-07-15 DIAGNOSIS — R41841 Cognitive communication deficit: Secondary | ICD-10-CM | POA: Diagnosis not present

## 2024-07-15 DIAGNOSIS — R2681 Unsteadiness on feet: Secondary | ICD-10-CM | POA: Diagnosis not present

## 2024-07-16 DIAGNOSIS — M810 Age-related osteoporosis without current pathological fracture: Secondary | ICD-10-CM | POA: Diagnosis not present

## 2024-07-20 DIAGNOSIS — R41841 Cognitive communication deficit: Secondary | ICD-10-CM | POA: Diagnosis not present

## 2024-07-20 DIAGNOSIS — R2681 Unsteadiness on feet: Secondary | ICD-10-CM | POA: Diagnosis not present

## 2024-07-20 DIAGNOSIS — M810 Age-related osteoporosis without current pathological fracture: Secondary | ICD-10-CM | POA: Diagnosis not present

## 2024-07-20 DIAGNOSIS — F02B0 Dementia in other diseases classified elsewhere, moderate, without behavioral disturbance, psychotic disturbance, mood disturbance, and anxiety: Secondary | ICD-10-CM | POA: Diagnosis not present

## 2024-07-20 DIAGNOSIS — M4854XA Collapsed vertebra, not elsewhere classified, thoracic region, initial encounter for fracture: Secondary | ICD-10-CM | POA: Diagnosis not present

## 2024-07-20 DIAGNOSIS — M545 Low back pain, unspecified: Secondary | ICD-10-CM | POA: Diagnosis not present

## 2024-07-20 DIAGNOSIS — G301 Alzheimer's disease with late onset: Secondary | ICD-10-CM | POA: Diagnosis not present

## 2024-07-21 DIAGNOSIS — R2681 Unsteadiness on feet: Secondary | ICD-10-CM | POA: Diagnosis not present

## 2024-07-21 DIAGNOSIS — G301 Alzheimer's disease with late onset: Secondary | ICD-10-CM | POA: Diagnosis not present

## 2024-07-21 DIAGNOSIS — M810 Age-related osteoporosis without current pathological fracture: Secondary | ICD-10-CM | POA: Diagnosis not present

## 2024-07-21 DIAGNOSIS — M545 Low back pain, unspecified: Secondary | ICD-10-CM | POA: Diagnosis not present

## 2024-07-21 DIAGNOSIS — R41841 Cognitive communication deficit: Secondary | ICD-10-CM | POA: Diagnosis not present

## 2024-07-21 DIAGNOSIS — F02B0 Dementia in other diseases classified elsewhere, moderate, without behavioral disturbance, psychotic disturbance, mood disturbance, and anxiety: Secondary | ICD-10-CM | POA: Diagnosis not present

## 2024-07-22 DIAGNOSIS — F02B0 Dementia in other diseases classified elsewhere, moderate, without behavioral disturbance, psychotic disturbance, mood disturbance, and anxiety: Secondary | ICD-10-CM | POA: Diagnosis not present

## 2024-07-22 DIAGNOSIS — R2681 Unsteadiness on feet: Secondary | ICD-10-CM | POA: Diagnosis not present

## 2024-07-22 DIAGNOSIS — M810 Age-related osteoporosis without current pathological fracture: Secondary | ICD-10-CM | POA: Diagnosis not present

## 2024-07-22 DIAGNOSIS — G301 Alzheimer's disease with late onset: Secondary | ICD-10-CM | POA: Diagnosis not present

## 2024-07-22 DIAGNOSIS — R41841 Cognitive communication deficit: Secondary | ICD-10-CM | POA: Diagnosis not present

## 2024-07-22 DIAGNOSIS — M545 Low back pain, unspecified: Secondary | ICD-10-CM | POA: Diagnosis not present

## 2024-07-27 DIAGNOSIS — M545 Low back pain, unspecified: Secondary | ICD-10-CM | POA: Diagnosis not present

## 2024-07-27 DIAGNOSIS — G301 Alzheimer's disease with late onset: Secondary | ICD-10-CM | POA: Diagnosis not present

## 2024-07-27 DIAGNOSIS — R2681 Unsteadiness on feet: Secondary | ICD-10-CM | POA: Diagnosis not present

## 2024-07-27 DIAGNOSIS — M810 Age-related osteoporosis without current pathological fracture: Secondary | ICD-10-CM | POA: Diagnosis not present

## 2024-07-27 DIAGNOSIS — F02B0 Dementia in other diseases classified elsewhere, moderate, without behavioral disturbance, psychotic disturbance, mood disturbance, and anxiety: Secondary | ICD-10-CM | POA: Diagnosis not present

## 2024-07-27 DIAGNOSIS — R41841 Cognitive communication deficit: Secondary | ICD-10-CM | POA: Diagnosis not present

## 2024-07-29 DIAGNOSIS — M810 Age-related osteoporosis without current pathological fracture: Secondary | ICD-10-CM | POA: Diagnosis not present

## 2024-07-29 DIAGNOSIS — G301 Alzheimer's disease with late onset: Secondary | ICD-10-CM | POA: Diagnosis not present

## 2024-07-29 DIAGNOSIS — F02B0 Dementia in other diseases classified elsewhere, moderate, without behavioral disturbance, psychotic disturbance, mood disturbance, and anxiety: Secondary | ICD-10-CM | POA: Diagnosis not present

## 2024-07-29 DIAGNOSIS — M545 Low back pain, unspecified: Secondary | ICD-10-CM | POA: Diagnosis not present

## 2024-07-29 DIAGNOSIS — R2681 Unsteadiness on feet: Secondary | ICD-10-CM | POA: Diagnosis not present

## 2024-07-29 DIAGNOSIS — R41841 Cognitive communication deficit: Secondary | ICD-10-CM | POA: Diagnosis not present

## 2024-08-03 DIAGNOSIS — R8271 Bacteriuria: Secondary | ICD-10-CM | POA: Diagnosis not present

## 2024-08-03 DIAGNOSIS — R2681 Unsteadiness on feet: Secondary | ICD-10-CM | POA: Diagnosis not present

## 2024-08-03 DIAGNOSIS — R41841 Cognitive communication deficit: Secondary | ICD-10-CM | POA: Diagnosis not present

## 2024-08-03 DIAGNOSIS — G301 Alzheimer's disease with late onset: Secondary | ICD-10-CM | POA: Diagnosis not present

## 2024-08-03 DIAGNOSIS — M810 Age-related osteoporosis without current pathological fracture: Secondary | ICD-10-CM | POA: Diagnosis not present

## 2024-08-03 DIAGNOSIS — F02B0 Dementia in other diseases classified elsewhere, moderate, without behavioral disturbance, psychotic disturbance, mood disturbance, and anxiety: Secondary | ICD-10-CM | POA: Diagnosis not present

## 2024-08-03 DIAGNOSIS — N3020 Other chronic cystitis without hematuria: Secondary | ICD-10-CM | POA: Diagnosis not present

## 2024-08-03 DIAGNOSIS — R350 Frequency of micturition: Secondary | ICD-10-CM | POA: Diagnosis not present

## 2024-08-03 DIAGNOSIS — M545 Low back pain, unspecified: Secondary | ICD-10-CM | POA: Diagnosis not present

## 2024-08-03 DIAGNOSIS — N3941 Urge incontinence: Secondary | ICD-10-CM | POA: Diagnosis not present

## 2024-08-04 ENCOUNTER — Encounter: Payer: Self-pay | Admitting: Gastroenterology

## 2024-08-04 ENCOUNTER — Ambulatory Visit (INDEPENDENT_AMBULATORY_CARE_PROVIDER_SITE_OTHER): Admitting: Gastroenterology

## 2024-08-04 VITALS — BP 130/68 | Ht 60.0 in | Wt 118.1 lb

## 2024-08-04 DIAGNOSIS — K5909 Other constipation: Secondary | ICD-10-CM

## 2024-08-04 DIAGNOSIS — K582 Mixed irritable bowel syndrome: Secondary | ICD-10-CM

## 2024-08-04 DIAGNOSIS — R159 Full incontinence of feces: Secondary | ICD-10-CM

## 2024-08-04 DIAGNOSIS — M6289 Other specified disorders of muscle: Secondary | ICD-10-CM

## 2024-08-04 MED ORDER — LOPERAMIDE HCL 2 MG PO TABS: 2.0000 mg | ORAL_TABLET | Freq: Every day | ORAL | 5 refills | Status: AC | PRN

## 2024-08-04 MED ORDER — DOCUSATE SODIUM 100 MG PO CAPS: 100.0000 mg | ORAL_CAPSULE | Freq: Two times a day (BID) | ORAL | 5 refills | Status: AC

## 2024-08-04 MED ORDER — LINACLOTIDE 72 MCG PO CAPS: 72.0000 ug | ORAL_CAPSULE | Freq: Every day | ORAL | 5 refills | Status: DC

## 2024-08-04 MED ORDER — BISACODYL EC 5 MG PO TBEC: 5.0000 mg | DELAYED_RELEASE_TABLET | ORAL | 0 refills | Status: AC

## 2024-08-04 NOTE — Patient Instructions (Addendum)
 VISIT SUMMARY:  Today, we addressed your chronic constipation, bowel symptoms, and high blood pressure. We discussed new treatment options and dietary changes to help manage your symptoms.  YOUR PLAN:  CHRONIC CONSTIPATION WITH FECAL INCONTINENCE: You have chronic constipation with symptoms like abdominal pain, bloating, and incomplete bowel movements. Your blood pressure issues may be related to your bowel problems. -Start taking Colace (docusate sodium ) 100 mg daily at bedtime. If it doesn't help, increase to twice daily. -Try the samples of Linzess  (linaclotide ) provided. If it works, we can prescribe it. Use half a dose if a full dose causes excessive bowel movements. -Stop taking Miralax  and Benefiber if you are getting enough fiber from your diet. -Continue to eat high-fiber foods like steel-cut  or rolled oats, fruits (pineapple, peaches), and vegetables. -Drink more fluids, aiming for two 32 oz bottles of water daily. You can dilute it with Pedialyte for added electrolytes. -You can use Imodium  (loperamide ) as needed for social events to prevent accidents, but be careful not to overuse it. -We will refer you to pelvic floor physical therapy to help with bowel and bladder control. -If constipation continues, we may consider a Miralax  purge to clear your bowels. -We may discuss a colonoscopy if there are ongoing concerns about polyps or tumors at next visit.  Dr Shila recommends that you complete a bowel purge (to clean out your bowels). Please do the following: Purchase a bottle of Miralax  over the counter as well as a box of 5 mg dulcolax tablets. Take 4 dulcolax tablets. Wait 1 hour. You will then drink 6-8 capfuls of Miralax  mixed in an adequate amount of water/juice/gatorade (you may choose which of these liquids to drink) over the next 2-3 hours. You should expect results within 1 to 6 hours after completing the bowel  purge.  _______________________________________________________  If your blood pressure at your visit was 140/90 or greater, please contact your primary care physician to follow up on this.  _______________________________________________________  If you are age 84 or older, your body mass index should be between 23-30. Your Body mass index is 23.07 kg/m. If this is out of the aforementioned range listed, please consider follow up with your Primary Care Provider.  If you are age 38 or younger, your body mass index should be between 19-25. Your Body mass index is 23.07 kg/m. If this is out of the aformentioned range listed, please consider follow up with your Primary Care Provider.   ________________________________________________________  The [facility] providers would like to encourage you to use MYCHART to communicate with providers for non-urgent requests or questions.  Due to long hold times on the telephone, sending your provider a message by [facility] may be a faster and more efficient way to get a response.  Please allow 48 business hours for a response.  Please remember that this is for non-urgent requests.  _______________________________________________________  [facility] is using a team-based approach to care.  Your team is made up of your doctor and two to three APPS. Our APPS (Nurse Practitioners and Physician Assistants) work with your physician to ensure care continuity for you. They are fully qualified to address your health concerns and develop a treatment plan. They communicate directly with your gastroenterologist to care for you. Seeing the Advanced Practice Practitioners on your physician's team can help you by facilitating care more promptly, often allowing for earlier appointments, access to diagnostic testing, procedures, and other specialty referrals.

## 2024-08-04 NOTE — Progress Notes (Signed)
 [name]    [id number]    06/09/1941  Primary Care Physician:Plotnikov, [name], MD  Referring Physician: Garald [name], MD [address] [place],  [place] [address]   Chief complaint:  constipation  Discussed the use of AI scribe software for clinical note transcription with the patient, who gave verbal consent to proceed.  History of Present Illness [name] is an 83 year old female with chronic constipation and ischemic colitis who presents with worsening bowel symptoms and high blood pressure.  Altered bowel habits and abdominal symptoms - Chronic constipation with worsening symptoms, including unpredictable and incomplete bowel movements - Persistent sensation of being 'backed up' despite use of Miralax  and Benefiber - Episodes of 'blowouts' and feelings of 'heaviness' and 'bloating' - Abdominal pain present bilaterally, associated with bowel dysfunction - No vomiting - Dietary modifications attempted, including increased fiber intake (fruits such as pineapple and peaches) and increased water consumption, without improvement in bowel regularity - Previous trial of Linzess  resulted in excessive laxation ('blowouts') and was discontinued  Blood pressure fluctuations and associated symptoms - Episodes of severe hypertension with systolic blood pressure readings over 200 mmHg, associated with lightheadedness and visual disturbances - Current blood pressure is stable but fluctuates significantly, with occasional drops below 100 mmHg causing malaise and dizziness  Musculoskeletal pain - Back pain attributed to seasonal changes  Barriers to care - Difficulty accessing physical therapy appointments recommended for pelvic floor dysfunction  GI Hx: US  Abdomen Limited RUQ 10-03-22  No cholelithiasis or sonographic evidence for acute cholecystitis.    DG Abd 2 view 01-29-22 1. Nonobstructive, nonspecific bowel gas pattern.    DG Abd 2  view 01-04-22 Nonobstructive bowel gas pattern.    Colonoscopy 06-03-18 - Decreased sphincter tone found on digital rectal exam. - Moderate diverticulosis in the sigmoid colon. There was narrowing of the colon in association with the diverticular opening. There was evidence of diverticular spasm. - Non-bleeding internal hemorrhoids. - Normal mucosa in the entire examined colon. Biopsied.  Biopsies negative for microscopic colitis.   Colonoscopy 10-23-15 1. 9 mm polyp in the cecum at the appendiceal orifice, injected submucosal saline to lift the mucosa, was unable to completely lift the polyp, snare polypectomy was performed in piecemeal but felt there may be an invasive complement and it was incomplete resection 2. Mild diverticulosis was noted in the sigmoid colon 3. Small internal hemorrhoids   Outpatient Encounter Medications as of 08/04/2024  Medication Sig   amLODipine  (NORVASC ) 2.5 MG tablet Take 1 tablet (2.5 mg total) by mouth daily.   b complex vitamins capsule Take 1 capsule by mouth daily.   Cholecalciferol (VITAMIN D3) 125 MCG (5000 UT) CAPS Take 1 capsule (5,000 Units total) by mouth daily.   denosumab  (PROLIA ) 60 MG/ML SOSY injection Inject 60 mg into the skin every 6 (six) months.   furosemide  (LASIX ) 20 MG tablet Take 1 tablet (20 mg total) by mouth daily for 3 days only, then as needed once per week for swelling thereafter.   Ibuprofen  (ADVIL  PO) Take by mouth.   LORazepam  (ATIVAN ) 0.5 MG tablet Take 1 tablet (0.5 mg total) by mouth 2 (two) times daily as needed for anxiety. for anxiety   olmesartan  (BENICAR ) 20 MG tablet Take 1 tablet (20 mg total) by mouth 2 (two) times daily.   polyethylene glycol (MIRALAX  / GLYCOLAX ) 17 g packet Take 17 g by mouth daily as needed for mild  constipation.   Vibegron (GEMTESA PO) Take by mouth. Urinary incontinence   [DISCONTINUED] polyethylene glycol powder (GLYCOLAX /MIRALAX ) 17 GM/SCOOP powder Take 17 g by mouth daily.   No  facility-administered encounter medications on file as of 08/04/2024.    Allergies as of 08/04/2024 - Review Complete 08/04/2024  Allergen Reaction Noted   Anesthetics, amide Other (See Comments) 02/20/2016   Lisinopril Cough 10/05/2009    Past Medical History:  Diagnosis Date   Abdominal pain, unspecified site    ADVERSE REACTION TO MEDICATION 07/31/2009   ANEMIA-NOS 07/07/2007   Anxiety    Arthritis    Chest pain, unspecified    Chronic kidney disease    hx of kidney stone   COLONIC POLYPS, ADENOMATOUS, HX OF    Complication of anesthesia    Dementia (HCC)    Disturbance of skin sensation    DIVERTICULOSIS, COLON 02/23/2009   Dyspnea    Family history of diabetes mellitus    Family history of malignant neoplasm of breast    FATIGUE 05/25/2009   Fever, unspecified    Heart murmur    hx of   HYPERLIPIDEMIA 07/07/2007   HYPERPARATHYROIDISM UNSPECIFIED 10/20/2007   HYPERTENSION 02/04/2008   Incomplete right bundle branch block (RBBB) determined by electrocardiography 04/15/2023   Irritable bowel syndrome (IBS)    NEPHROLITHIASIS, HX OF 11/15/2008   OSTEOPOROSIS 01/01/2008   Other acquired absence of organ    parathyroidectomy   Pain in limb    PALPITATIONS, OCCASIONAL 07/12/2008   PARATHYROIDECTOMY 01/02/2009   PONV (postoperative nausea and vomiting)    SCOLIOSIS 05/25/2009   Seasonal allergies    Unspecified constipation    URINARY INCONTINENCE 07/07/2007   UTI'S, RECURRENT 11/20/2009   kimbrough     Past Surgical History:  Procedure Laterality Date   BUNIONECTOMY     CATARACT EXTRACTION, BILATERAL     COLONOSCOPY     EXTRACORPOREAL SHOCK WAVE LITHOTRIPSY Left 12/21/2020   Procedure: EXTRACORPOREAL SHOCK WAVE LITHOTRIPSY (ESWL);  Surgeon: Nieves Cough, MD;  Location: [facility];  Service: Urology;  Laterality: Left;   LAPAROSCOPIC APPENDECTOMY  01/19/2016   Procedure: APPENDECTOMY LAPAROSCOPIC with orifice of cecal polyp;  Surgeon: Bernarda Ned, MD;  Location: [facility];  Service: General;;   LEFT HEART CATH AND CORONARY ANGIOGRAPHY N/A 04/07/2017   Procedure: Left Heart Cath and Coronary Angiography;  Surgeon: Mady Bruckner, MD;  Location: [facility];  Service: Cardiovascular;  Laterality: N/A;   OLECRANON BURSECTOMY Left 10/21/2023   Procedure: OLECRANON (ELBOW) BURSA;  Surgeon: Beverley Evalene BIRCH, MD;  Location: [facility];  Service: Orthopedics;  Laterality: Left;   ORIF ELBOW FRACTURE Left 10/21/2023   Procedure: Open Reduction Internal fixation (ORIF), fracture, elbow/olecranon;  Surgeon: Beverley Evalene BIRCH, MD;  Location: [facility];  Service: Orthopedics;  Laterality: Left;   PARATHYROIDECTOMY     Rt Superior open neck exploration   POLYPECTOMY     RIGHT OOPHORECTOMY     TONSILLECTOMY      Family History  Problem Relation Age of Onset   Heart disease Mother        valve replacement   Sudden death Father 45   Heart disease Father 52       MI   Breast cancer Paternal Aunt    Diabetes Other        1st degree relative   Colon cancer Neg Hx    Stomach cancer Neg Hx    Esophageal cancer Neg Hx    Pancreatic cancer Neg  Hx     Social History   Socioeconomic History   Marital status: Widowed    Spouse name: Not on file   Number of children: 3   Years of education: Not on file   Highest education level: Not on file  Occupational History   Occupation: travel agent    Employer: RETIRED  Tobacco Use   Smoking status: Never    Passive exposure: Never   Smokeless tobacco: Never  Vaping Use   Vaping status: Never Used  Substance and Sexual Activity   Alcohol use: Yes    Comment: 1 glass of wine per day   Drug use: No   Sexual activity: Never  Other Topics Concern   Not on file  Social History Narrative       2-3 c coffee in am    ocass wine    G3P2vaginal delivery   Pt cell [phone]      Patient walks daily for exercise.    Social Drivers of Corporate investment banker Strain: Low Risk  (03/17/2024)    Overall Financial Resource Strain (CARDIA)    Difficulty of Paying Living Expenses: Not hard at all  Food Insecurity: No Food Insecurity (03/17/2024)   Hunger Vital Sign    Worried About Running Out of Food in the Last Year: Never true    Ran Out of Food in the Last Year: Never true  Transportation Needs: No Transportation Needs (03/17/2024)   PRAPARE - Administrator, Civil Service (Medical): No    Lack of Transportation (Non-Medical): No  Physical Activity: Insufficiently Active (03/17/2024)   Exercise Vital Sign    Days of Exercise per Week: 3 days    Minutes of Exercise per Session: 20 min  Stress: No Stress Concern Present (03/17/2024)   [employer] of Occupational Health - Occupational Stress Questionnaire    Feeling of Stress : Not at all  Social Connections: Moderately Integrated (03/17/2024)   Social Connection and Isolation Panel    Frequency of Communication with Friends and Family: More than three times a week    Frequency of Social Gatherings with Friends and Family: More than three times a week    Attends Religious Services: More than 4 times per year    Active Member of [employer] or Organizations: Yes    Attends Banker Meetings: More than 4 times per year    Marital Status: Widowed  Intimate Partner Violence: Not At Risk (03/17/2024)   Humiliation, Afraid, Rape, and Kick questionnaire    Fear of Current or Ex-Partner: No    Emotionally Abused: No    Physically Abused: No    Sexually Abused: No      Review of systems: All other review of systems negative except as mentioned in the HPI.   Physical Exam: Vitals:   08/04/24 0925  BP: 130/68   Body mass index is 23.07 kg/m. Gen:      No acute distress HEENT:  sclera anicteric Abd:      soft, non-tender; no palpable masses, no distension Ext:    No edema Neuro: alert and oriented x 3 Psych: normal mood and affect  Data Reviewed:  Reviewed labs, radiology imaging, old records and  pertinent past GI work up      Assessment & Plan Chronic constipation with fecal incontinence Chronic constipation with associated fecal incontinence, abdominal pain, bloating, and incomplete evacuation. Episodes of hypertension and lightheadedness may be related to bowel issues. Previous use of  Miralax  and Benefiber resulted in unpredictable bowel movements and blowouts. Potential for ischemic colitis due to decreased blood supply to the colon was discussed. - Initiate Colace (docusate sodium ) 100 mg daily at bedtime. If not effective, increase to twice daily. - Provide samples of Linzess  (linaclotide ) for trial. If effective, consider prescription. Use half dose if full dose causes blowouts. - Discontinue Miralax  and Benefiber if dietary fiber intake is sufficient. - Encourage dietary modifications: increase intake of high-fiber foods such as steel-cut oats, fruits (pineapple, peaches), and vegetables. - Encourage increased fluid intake, aiming for two bottles of water daily, possibly diluted with Pedialyte for electrolytes. - Discussed use of Imodium  (loperamide ) as needed for social events to prevent fecal incontinence, with caution to avoid overuse. - Refer to pelvic floor physical therapy to improve bowel and bladder control. - Consider a Miralax  purge for bowel clearance if constipation persists. - Discussed potential for colonoscopy if concerns about polyps or tumors persist.  Will rediscuss at next visit in 3 months.   Return in 3 months for follow-up  This visit required >40 minutes of patient care (this includes precharting, chart review, review of results, face-to-face time used for counseling as well as treatment plan and follow-up. The patient was provided an opportunity to ask questions and all were answered. The patient agreed with the plan and demonstrated an understanding of the instructions.  [name] , MD    CC: Plotnikov, [name], MD

## 2024-08-05 ENCOUNTER — Ambulatory Visit: Payer: Self-pay

## 2024-08-05 ENCOUNTER — Telehealth: Payer: Self-pay | Admitting: Family

## 2024-08-05 DIAGNOSIS — F02B0 Dementia in other diseases classified elsewhere, moderate, without behavioral disturbance, psychotic disturbance, mood disturbance, and anxiety: Secondary | ICD-10-CM | POA: Diagnosis not present

## 2024-08-05 DIAGNOSIS — M810 Age-related osteoporosis without current pathological fracture: Secondary | ICD-10-CM | POA: Diagnosis not present

## 2024-08-05 DIAGNOSIS — M545 Low back pain, unspecified: Secondary | ICD-10-CM | POA: Diagnosis not present

## 2024-08-05 DIAGNOSIS — R2681 Unsteadiness on feet: Secondary | ICD-10-CM | POA: Diagnosis not present

## 2024-08-05 DIAGNOSIS — G301 Alzheimer's disease with late onset: Secondary | ICD-10-CM | POA: Diagnosis not present

## 2024-08-05 DIAGNOSIS — R41841 Cognitive communication deficit: Secondary | ICD-10-CM | POA: Diagnosis not present

## 2024-08-05 NOTE — Telephone Encounter (Signed)
 FYI Only or Action Required?: Action required by provider: request for appointment, update on patient condition, and please advise regarding contacting Dr. Charlanne in facility to evaluate patient.  Patient was last seen in primary care on 06/29/2024 by Plotnikov, [name], MD.  Called Nurse Triage reporting Dizziness.  Symptoms began today.  Interventions attempted: Rest, hydration, or home remedies.  Symptoms are: unchanged.  Triage Disposition: See HCP Within 4 Hours (Or PCP Triage)  Patient/caregiver understands and will follow disposition?: Unsure             Reason for Disposition  [1] Dizziness caused by heat exposure, sudden standing, or poor fluid intake AND [2] no improvement after 2 hours of rest and fluids  Answer Assessment - Initial Assessment Questions Recommended UC and patient reports nurse in facility can notify Dr. Charlanne. Recommended patient nurse contact Dr. Charlanne now. No available appt with PCP until 08/12/24 and other providers tomorrow. Recommended patient be evaluated today due to SOB at rest . Recommended drinking fluids and gatorade. Patient reports trouble opening bottle, was able to get gatorade open. CAL notified patient requesting to call Dr. Charlanne instead of UC.  Recommended if sx worsen go to ED or call 911.   1. DESCRIPTION: Describe your dizziness.     lightheaded 2. LIGHTHEADED: Do you feel lightheaded? (e.g., somewhat faint, woozy, weak upon standing)     Upon standing  3. VERTIGO: Do you feel like either you or the room is spinning or tilting? (i.e., vertigo)     na 4. SEVERITY: How bad is it?  Do you feel like you are going to faint? Can you stand and walk?     Dizziness with standing and this am felt like passing out but after drinking gatorade feels better. 5. ONSET:  When did the dizziness begin?     Today  6. AGGRAVATING FACTORS: Does anything make it worse? (e.g., standing, change in head position)     Standing  7.  HEART RATE: Can you tell me your heart rate? How many beats in 15 seconds?  (Note: Not all patients can do this.)       64, 62 8. CAUSE: What do you think is causing the dizziness? (e.g., decreased fluids or food, diarrhea, emotional distress, heat exposure, new medicine, sudden standing, vomiting; unknown)     Not sure elevated BP today 191/108 and took olmesartan  at 1000. Rechecked BP 127/85 HR 64 and rechecked again BP 145/83 HR 62. Reports blurred vision and chronic issue. SOB at rest x couple of days. no chest pain no difficulty breathing.  9. RECURRENT SYMPTOM: Have you had dizziness before? If Yes, ask: When was the last time? What happened that time?     Na  10. OTHER SYMPTOMS: Do you have any other symptoms? (e.g., fever, chest pain, vomiting, diarrhea, bleeding)       Dizziness , Sob at rest x couple of days . Tingling bilateral legs chronic, see above sx.  11. PREGNANCY: Is there any chance you are pregnant? When was your last menstrual period?       na  Protocols used: Dizziness - Lightheadedness-A-AH

## 2024-08-05 NOTE — Telephone Encounter (Signed)
 Copied from CRM [id number]. Topic: Clinical - Medical Advice >> Aug 05, 2024  9:18 AM Dawna HERO wrote: Reason for CRM: pt called in because blood pressure was 190/82 but says she was weak and couldn't open Gatorade bottle, stated she took blood pressure pill & wanted to know what else she should do and will call back if symptoms don't change

## 2024-08-05 NOTE — Telephone Encounter (Signed)
 Spoke with patient and physical therapist regarding blood pressures  Per PT on Tuesday patient woke up with blood pressure of 200/108 Patient stated blood pressure this am when she woke up 191/108, took Olmesartan  Blood pressure 40 minutes ago 180/100 Denies fever but patient stated she does not feel well PT stated she woke up feeling lightheaded and like she was going to fall   Confirmed dizziness, cold sweats, shivering but denies fever  Discussed with Rosaline RAMAN NP who advised patient needs to go to ED for evaluation  Advised PT, verbalized understanding

## 2024-08-05 NOTE — Telephone Encounter (Signed)
 Pt c/o BP issue: STAT if pt c/o blurred vision, one-sided weakness or slurred speech.  STAT if BP is GREATER than 180/120 TODAY.  STAT if BP is LESS than 90/60 and SYMPTOMATIC TODAY  1. What is your BP concern? Pt concerned her BP is too high   2. Have you taken any BP medication today?yes, she took her olmesartan    3. What are your last 5 BP readings?  191/108 - today  181/93 - today  179/100 - today   4. Are you having any other symptoms (ex. Dizziness, headache, blurred vision, passed out)? Dizziness, cold sweats, shivering

## 2024-08-05 NOTE — Telephone Encounter (Signed)
 Agree with recommendation for ED eval. Known labile hypertension so hesitant to over-correct as she is already dizzy. Concern for infection given cold sweats, shivering.   Teresa Mathwig S Mujtaba Bollig, NP

## 2024-08-06 ENCOUNTER — Other Ambulatory Visit: Payer: Self-pay

## 2024-08-06 ENCOUNTER — Telehealth: Payer: Self-pay

## 2024-08-06 ENCOUNTER — Ambulatory Visit: Payer: Self-pay

## 2024-08-06 ENCOUNTER — Emergency Department (HOSPITAL_COMMUNITY)

## 2024-08-06 ENCOUNTER — Emergency Department (HOSPITAL_COMMUNITY)
Admission: EM | Admit: 2024-08-06 | Discharge: 2024-08-06 | Disposition: A | Discharge status: Home / Self Care | Attending: Emergency Medicine | Admitting: Emergency Medicine

## 2024-08-06 DIAGNOSIS — F0390 Unspecified dementia without behavioral disturbance: Secondary | ICD-10-CM | POA: Insufficient documentation

## 2024-08-06 DIAGNOSIS — R2681 Unsteadiness on feet: Secondary | ICD-10-CM | POA: Diagnosis not present

## 2024-08-06 DIAGNOSIS — R0602 Shortness of breath: Secondary | ICD-10-CM | POA: Diagnosis not present

## 2024-08-06 DIAGNOSIS — R41841 Cognitive communication deficit: Secondary | ICD-10-CM | POA: Diagnosis not present

## 2024-08-06 DIAGNOSIS — N189 Chronic kidney disease, unspecified: Secondary | ICD-10-CM | POA: Diagnosis not present

## 2024-08-06 DIAGNOSIS — I951 Orthostatic hypotension: Secondary | ICD-10-CM | POA: Insufficient documentation

## 2024-08-06 DIAGNOSIS — R918 Other nonspecific abnormal finding of lung field: Secondary | ICD-10-CM | POA: Diagnosis not present

## 2024-08-06 DIAGNOSIS — G301 Alzheimer's disease with late onset: Secondary | ICD-10-CM | POA: Diagnosis not present

## 2024-08-06 DIAGNOSIS — R42 Dizziness and giddiness: Secondary | ICD-10-CM | POA: Diagnosis present

## 2024-08-06 DIAGNOSIS — Z79899 Other long term (current) drug therapy: Secondary | ICD-10-CM | POA: Insufficient documentation

## 2024-08-06 DIAGNOSIS — M810 Age-related osteoporosis without current pathological fracture: Secondary | ICD-10-CM | POA: Diagnosis not present

## 2024-08-06 DIAGNOSIS — M545 Low back pain, unspecified: Secondary | ICD-10-CM | POA: Diagnosis not present

## 2024-08-06 DIAGNOSIS — I129 Hypertensive chronic kidney disease with stage 1 through stage 4 chronic kidney disease, or unspecified chronic kidney disease: Secondary | ICD-10-CM | POA: Diagnosis not present

## 2024-08-06 DIAGNOSIS — I10 Essential (primary) hypertension: Secondary | ICD-10-CM | POA: Diagnosis not present

## 2024-08-06 DIAGNOSIS — F02B0 Dementia in other diseases classified elsewhere, moderate, without behavioral disturbance, psychotic disturbance, mood disturbance, and anxiety: Secondary | ICD-10-CM | POA: Diagnosis not present

## 2024-08-06 DIAGNOSIS — I771 Stricture of artery: Secondary | ICD-10-CM | POA: Diagnosis not present

## 2024-08-06 LAB — CBC WITH DIFFERENTIAL/PLATELET
Abs Immature Granulocytes: 0.02 K/uL (ref 0.00–0.07)
Basophils Absolute: 0 K/uL (ref 0.0–0.1)
Basophils Relative: 1 %
Eosinophils Absolute: 0.1 K/uL (ref 0.0–0.5)
Eosinophils Relative: 1 %
HCT: 43.7 % (ref 36.0–46.0)
Hemoglobin: 14.3 g/dL (ref 12.0–15.0)
Immature Granulocytes: 0 %
Lymphocytes Relative: 18 %
Lymphs Abs: 1.1 K/uL (ref 0.7–4.0)
MCH: 31.5 pg (ref 26.0–34.0)
MCHC: 32.7 g/dL (ref 30.0–36.0)
MCV: 96.3 fL (ref 80.0–100.0)
Monocytes Absolute: 0.7 K/uL (ref 0.1–1.0)
Monocytes Relative: 11 %
Neutro Abs: 4.3 K/uL (ref 1.7–7.7)
Neutrophils Relative %: 69 %
Platelets: 206 K/uL (ref 150–400)
RBC: 4.54 MIL/uL (ref 3.87–5.11)
RDW: 13.2 % (ref 11.5–15.5)
WBC: 6.2 K/uL (ref 4.0–10.5)
nRBC: 0 % (ref 0.0–0.2)

## 2024-08-06 LAB — BRAIN NATRIURETIC PEPTIDE: B Natriuretic Peptide: 177.4 pg/mL — ABNORMAL HIGH (ref 0.0–100.0)

## 2024-08-06 LAB — COMPREHENSIVE METABOLIC PANEL WITH GFR
ALT: 17 U/L (ref 0–44)
AST: 20 U/L (ref 15–41)
Albumin: 3.7 g/dL (ref 3.5–5.0)
Alkaline Phosphatase: 47 U/L (ref 38–126)
Anion gap: 9 (ref 5–15)
BUN: 24 mg/dL — ABNORMAL HIGH (ref 8–23)
CO2: 25 mmol/L (ref 22–32)
Calcium: 9.8 mg/dL (ref 8.9–10.3)
Chloride: 105 mmol/L (ref 98–111)
Creatinine, Ser: 0.81 mg/dL (ref 0.44–1.00)
GFR, Estimated: >60 mL/min (ref >60)
Glucose, Bld: 105 mg/dL — ABNORMAL HIGH (ref 70–99)
Potassium: 4.2 mmol/L (ref 3.5–5.1)
Sodium: 139 mmol/L (ref 135–145)
Total Bilirubin: 1.3 mg/dL — ABNORMAL HIGH (ref 0.0–1.2)
Total Protein: 6.5 g/dL (ref 6.5–8.1)

## 2024-08-06 LAB — RESP PANEL BY RT-PCR (RSV, FLU A&B, COVID)  RVPGX2
Influenza A by PCR: NEGATIVE
Influenza B by PCR: NEGATIVE
Resp Syncytial Virus by PCR: NEGATIVE
SARS Coronavirus 2 by RT PCR: NEGATIVE

## 2024-08-06 LAB — TROPONIN I (HIGH SENSITIVITY): Troponin I (High Sensitivity): 7 ng/L (ref <18)

## 2024-08-06 MED ORDER — LOSARTAN POTASSIUM 50 MG PO TABS
25.0000 mg | ORAL_TABLET | Freq: Once | ORAL | Status: DC
  Filled 2024-08-06: qty 1

## 2024-08-06 MED ORDER — AMLODIPINE BESYLATE 5 MG PO TABS
2.5000 mg | ORAL_TABLET | Freq: Once | ORAL | Status: DC
  Filled 2024-08-06: qty 1

## 2024-08-06 MED ORDER — SODIUM CHLORIDE 0.9 % IV BOLUS
1000.0000 mL | Freq: Once | INTRAVENOUS | Status: AC
  Administered 2024-08-06: 1000 mL via INTRAVENOUS

## 2024-08-06 NOTE — ED Notes (Signed)
 Was doing orthostatic vital signs, but pt. Was taken to xray

## 2024-08-06 NOTE — ED Provider Notes (Signed)
 Assumption of care note  Handoff:  83 y.o. here with dizziness when standing up and difficulty managing her blood pressure for several years. Hypertensive when seated but drops significantly when standing. Getting fluids. No chest pain. Does not need a delta trop. Low suspicion for PE.   I reevaluated the patient at bedside.  She feels much better after a fluid bolus.  She was able to walk to the bathroom on her own without difficulty.  On my evaluation, her resting pulse was 70.  When I stood her up she remained in the low 70s.  Blood pressure standing up was 148 systolic.  She had no dizziness or other complaints.  Counseled patient on importance of PCP follow-up and possible medication adjustment given her orthostatic hypotension  Physical Exam  BP (!) 165/67   Pulse 60   Temp 98.9 F (37.2 C) (Oral)   Resp 20   Ht 5' (1.524 m)   Wt 53.1 kg   SpO2 98%   BMI 22.85 kg/m   Medical Decision Making Problems Addressed: Orthostatic hypotension: complicated acute illness or injury that poses a threat to life or bodily functions  Amount and/or Complexity of Data Reviewed Labs: ordered. Decision-making details documented in ED Course. Radiology: ordered and independent interpretation performed. Decision-making details documented in ED Course. ECG/medicine tests: ordered and independent interpretation performed. Decision-making details documented in ED Course.  Risk Decision regarding hospitalization.   Disposition: Discharge        Dionisio Blunt, MD 08/06/24 1644    Patsey Lot, MD 08/06/24 [phone]

## 2024-08-06 NOTE — Telephone Encounter (Signed)
 FYI Only or Action Required?: FYI only for provider.  Patient was last seen in primary care on 06/29/2024 by Plotnikov, [name], MD.  Called Nurse Triage reporting Hypertension.  Symptoms began today.  Interventions attempted: Nothing.  Symptoms are: gradually worsening.  Triage Disposition: Go to ED Now (Notify PCP)  Patient/caregiver understands and will follow disposition?: Yes   Copied from CRM [id number]. Topic: Clinical - Red Word Triage >> Aug 06, 2024 10:26 AM Adelita BRAVO wrote: [facility] that prompted transfer to Nurse Triage: Edsel, RN clinic nurse, called in stating that patient is having fluctuation in blood pressure and is symptomatic. Patient feels fatigued, 2 near falls. Reason for Disposition  [1] Systolic BP >= 160 OR Diastolic >= 100 AND [2] cardiac (e.g., breathing difficulty, chest pain) or neurologic symptoms (e.g., new-onset blurred or double vision, unsteady gait)  Answer Assessment - Initial Assessment Questions 1. BLOOD PRESSURE: What is your blood pressure? Did you take at least two measurements 5 minutes apart?     162/78  2. ONSET: When did you take your blood pressure?     today 3. HOW: How did you take your blood pressure? (e.g., automatic home BP monitor, visiting nurse)     manual 4. HISTORY: Do you have a history of high blood pressure?     yes 5. MEDICINES: Are you taking any medicines for blood pressure? Have you missed any doses recently?     This am did not take bp because yesterday dropped to 104/58 6. OTHER SYMPTOMS: Do you have any symptoms? (e.g., blurred vision, chest pain, difficulty breathing, headache, weakness)     Fatigue, almost fell x2 7. PREGNANCY: Is there any chance you are pregnant? When was your last menstrual period?     na  Protocols used: Blood Pressure - High-A-AH

## 2024-08-06 NOTE — ED Notes (Signed)
 Pt. Expressed that she is ready to go home; Pt. Verbalized understanding of d/c; Pt. Son was at [facility] and also verbalized understanding

## 2024-08-06 NOTE — Telephone Encounter (Signed)
 Therapy Referral faxed 08/04/2024

## 2024-08-06 NOTE — ED Provider Notes (Signed)
 [place] EMERGENCY DEPARTMENT AT [facility] Provider Note   CSN: [id number] Arrival date & time: 08/06/24  1226     Patient presents with: Hypertension   [name] is a 83 y.o. female with history of hypertension, CKD, dementia presents with complaints of elevated blood pressure.  Patient is accompanied by her son.  She reportedly has had large swings of her blood pressure over the past few years now there have been many adjustments.  She is currently on amlodipine  2.5, olmesartan  20 and take Lasix  as needed for lower extremity edema.  She reports feeling dizzy when she stands up, describes as room spinning.  Notes that she has fallen multiple times.  Not associated with any headache, extremity weakness, numbness.  Does report blurry vision but states that this has been ongoing for some time now and reports chronic eye issues.  Denies any chest pain but does endorse shortness of breath with exertion.  Denies any orthopnea.  Does endorse a nonproductive cough that has been ongoing for some time now as well.    Hypertension      Past Medical History:  Diagnosis Date   Abdominal pain, unspecified site    ADVERSE REACTION TO MEDICATION 07/31/2009   ANEMIA-NOS 07/07/2007   Anxiety    Arthritis    Chest pain, unspecified    Chronic kidney disease    hx of kidney stone   COLONIC POLYPS, ADENOMATOUS, HX OF    Complication of anesthesia    Dementia (HCC)    Disturbance of skin sensation    DIVERTICULOSIS, COLON 02/23/2009   Dyspnea    Family history of diabetes mellitus    Family history of malignant neoplasm of breast    FATIGUE 05/25/2009   Fever, unspecified    Heart murmur    hx of   HYPERLIPIDEMIA 07/07/2007   HYPERPARATHYROIDISM UNSPECIFIED 10/20/2007   HYPERTENSION 02/04/2008   Incomplete right bundle branch block (RBBB) determined by electrocardiography 04/15/2023   Irritable bowel syndrome (IBS)    NEPHROLITHIASIS, HX OF 11/15/2008   OSTEOPOROSIS  01/01/2008   Other acquired absence of organ    parathyroidectomy   Pain in limb    PALPITATIONS, OCCASIONAL 07/12/2008   PARATHYROIDECTOMY 01/02/2009   PONV (postoperative nausea and vomiting)    SCOLIOSIS 05/25/2009   Seasonal allergies    Unspecified constipation    URINARY INCONTINENCE 07/07/2007   UTI'S, RECURRENT 11/20/2009   kimbrough    Past Surgical History:  Procedure Laterality Date   BUNIONECTOMY     CATARACT EXTRACTION, BILATERAL     COLONOSCOPY     EXTRACORPOREAL SHOCK WAVE LITHOTRIPSY Left 12/21/2020   Procedure: EXTRACORPOREAL SHOCK WAVE LITHOTRIPSY (ESWL);  Surgeon: Nieves Cough, MD;  Location: [facility];  Service: Urology;  Laterality: Left;   LAPAROSCOPIC APPENDECTOMY  01/19/2016   Procedure: APPENDECTOMY LAPAROSCOPIC with orifice of cecal polyp;  Surgeon: Bernarda Ned, MD;  Location: [facility];  Service: General;;   LEFT HEART CATH AND CORONARY ANGIOGRAPHY N/A 04/07/2017   Procedure: Left Heart Cath and Coronary Angiography;  Surgeon: Mady Bruckner, MD;  Location: [facility];  Service: Cardiovascular;  Laterality: N/A;   OLECRANON BURSECTOMY Left 10/21/2023   Procedure: OLECRANON (ELBOW) BURSA;  Surgeon: Beverley Evalene BIRCH, MD;  Location: [facility];  Service: Orthopedics;  Laterality: Left;   ORIF ELBOW FRACTURE Left 10/21/2023   Procedure: Open Reduction Internal fixation (ORIF), fracture, elbow/olecranon;  Surgeon: Beverley Evalene BIRCH, MD;  Location: [facility];  Service: Orthopedics;  Laterality: Left;   PARATHYROIDECTOMY     Rt Superior open neck exploration   POLYPECTOMY     RIGHT OOPHORECTOMY     TONSILLECTOMY       Prior to Admission medications   Medication Sig Start Date End Date Taking? Authorizing Provider  amLODipine  (NORVASC ) 2.5 MG tablet Take 1 tablet (2.5 mg total) by mouth daily. 06/29/24 06/29/25  Plotnikov, Aleksei V, MD  b complex vitamins capsule Take 1 capsule by mouth daily.    [provider]  bisacodyl  5 MG EC  tablet Take 1 tablet (5 mg total) by mouth as directed. As instructed for bowel purge 08/04/24   Nandigam, Kavitha V, MD  Cholecalciferol (VITAMIN D3) 125 MCG (5000 UT) CAPS Take 1 capsule (5,000 Units total) by mouth daily. 10/10/23   Darlean Maus, NP  denosumab  (PROLIA ) 60 MG/ML SOSY injection Inject 60 mg into the skin every 6 (six) months.    [provider]  docusate sodium  (COLACE) 100 MG capsule Take 1 capsule (100 mg total) by mouth 2 (two) times daily. 08/04/24   Nandigam, Kavitha V, MD  furosemide  (LASIX ) 20 MG tablet Take 1 tablet (20 mg total) by mouth daily for 3 days only, then as needed once per week for swelling thereafter. 07/01/24   Walker, Caitlin S, NP  Ibuprofen  (ADVIL  PO) Take by mouth.    [provider]  linaclotide  (LINZESS ) 72 MCG capsule Take 1 capsule (72 mcg total) by mouth daily before breakfast. 08/04/24   Nandigam, Kavitha V, MD  loperamide  (IMODIUM  A-D) 2 MG tablet Take 1 tablet (2 mg total) by mouth daily as needed for diarrhea or loose stools. 08/04/24   Nandigam, Kavitha V, MD  LORazepam  (ATIVAN ) 0.5 MG tablet Take 1 tablet (0.5 mg total) by mouth 2 (two) times daily as needed for anxiety. for anxiety 03/23/24   Plotnikov, [name], MD  olmesartan  (BENICAR ) 20 MG tablet Take 1 tablet (20 mg total) by mouth 2 (two) times daily. 03/23/24   Plotnikov, Aleksei V, MD  polyethylene glycol (MIRALAX  / GLYCOLAX ) 17 g packet Take 17 g by mouth daily as needed for mild constipation.    [provider]  Vibegron (GEMTESA PO) Take by mouth. Urinary incontinence    [provider]    Allergies: Anesthetics, amide and Lisinopril    Review of Systems  Updated Vital Signs BP (!) 165/67   Pulse 60   Temp 98.9 F (37.2 C) (Oral)   Resp 20   Ht 5' (1.524 m)   Wt 53.1 kg   SpO2 98%   BMI 22.85 kg/m   Physical Exam Vitals and nursing note reviewed.  Constitutional:      General: She is not in acute distress.    Appearance: She is  well-developed.  HENT:     Head: Normocephalic and atraumatic.  Eyes:     Conjunctiva/sclera: Conjunctivae normal.  Cardiovascular:     Rate and Rhythm: Normal rate and regular rhythm.     Heart sounds: No murmur heard. Pulmonary:     Effort: Pulmonary effort is normal. No respiratory distress.     Breath sounds: Normal breath sounds.  Abdominal:     Palpations: Abdomen is soft.     Tenderness: There is no abdominal tenderness.  Musculoskeletal:        General: No swelling.     Cervical back: Neck supple.  Skin:    General: Skin is warm and dry.     Capillary Refill: Capillary refill  takes less than 2 seconds.  Neurological:     Mental Status: She is alert.     Comments: Patient is alert and oriented. There is no abnormal phonation. Symmetric smile without facial droop.  Moves all extremities spontaneously. 5/5 strength in upper and lower extremities. . No sensation deficit. There is no nystagmus. EOMI, PERRL. Coordination intact with finger to nose   Psychiatric:        Mood and Affect: Mood normal.     (all labs ordered are listed, but only abnormal results are displayed) Labs Reviewed  COMPREHENSIVE METABOLIC PANEL WITH GFR - Abnormal; Notable for the following components:      Result Value   Glucose, Bld 105 (*)    BUN 24 (*)    Total Bilirubin 1.3 (*)    All other components within normal limits  BRAIN NATRIURETIC PEPTIDE - Abnormal; Notable for the following components:   B Natriuretic Peptide 177.4 (*)    All other components within normal limits  RESP PANEL BY RT-PCR (RSV, FLU A&B, COVID)  [id number]  CBC WITH DIFFERENTIAL/PLATELET  TROPONIN I (HIGH SENSITIVITY)    EKG: EKG Interpretation Date/Time:  Friday August 06 2024 13:20:49 EDT Ventricular Rate:  70 PR Interval:  180 QRS Duration:  88 QT Interval:  410 QTC Calculation: 443 R Axis:   -39  Text Interpretation: Sinus rhythm Left axis deviation Low voltage, precordial leads Probable anteroseptal  infarct, old Confirmed by Pamella Sharper [phone]) on 08/06/2024 1:50:10 PM  Radiology: [facility] Chest 2 View Result Date: 08/06/2024 EXAM: 2 VIEW(S) XRAY OF THE CHEST 08/06/2024 02:28:00 PM COMPARISON: 04/03/2022 CLINICAL HISTORY: SOB. Reason for exam: SOB; Hx of FINDINGS: LUNGS AND PLEURA: No focal pulmonary opacity. No pulmonary edema. Small bilateral pleural effusions suspected. No pneumothorax. HEART AND MEDIASTINUM: Tortuous thoracic aorta. No acute abnormality of the cardiac and mediastinal silhouettes. BONES AND SOFT TISSUES: Postsurgical changes of right thoracic inlet. Midthoracic compression deformities. Multilevel degenerative changes of thoracic spine. No acute osseous abnormality. IMPRESSION: 1. Small bilateral pleural effusions suspected. 2. Tortuous thoracic aorta. 3. Postsurgical changes of right thoracic inlet. 4. Midthoracic compression deformities and multilevel degenerative changes of thoracic spine. Electronically signed by: Waddell Calk MD 08/06/2024 02:48 PM EDT RP Workstation: [id number]     Procedures   Medications Ordered in the ED  sodium chloride  0.9 % bolus 1,000 mL (1,000 mLs Intravenous New Bag/Given 08/06/24 1509)    Clinical Course as of 08/06/24 1536  Fri Aug 06, 2024  1323 CBC with Differential Unremarkable [JT]  1324 Patient with history of hypertension evaluated for complaints of difficulty managing blood pressure, feeling dizzy with standing with associated shortness of breath.  Patient is notably hypertensive to 195/78 at this time.  She did not take her blood pressure medication this morning.  She has no neurodeficits on exam.  Her lungs are clear.  Will obtain routine labs, orthostatic vitals, EKG. [JT]  1352 Comprehensive metabolic panel(!) No significant abnormality [JT]  1353 Troponin I (High Sensitivity) Without elevation [JT]  1456 Brain natriuretic peptide(!) Mildly elevated [JT]  1456 Orthostatic Lying BP- Lying: 180/76Pulse- Lying: 68 Orthostatic  Sitting BP- Sitting: 184/72Pulse- Sitting: 63 Orthostatic Standing at 0 minutes BP- Standing at 0 minutes: 99/69Pulse- Standing at 0 minutes: 70 Orthostatic Standing at 3 minutes BP- Standing at 3 minutes: 98/61Pulse- Standing at 3 minutes: 71   [JT]  1457 Consistent with orthostatic hypotension.  Will minister fluids.  Patient is ambulatory. [JT]  1523 83 y.o. here with dizzy when standing up  and difficulty managing her blood pressure for several years. Hypertensive when seated but drops significantly when standing. Getting fluids. No chest pain. Does not need a delta trop. Low suspicion for PE.   To do - Reevaluate after fluids [AF]    Clinical Course User Index [AF] Dionisio Blunt, MD [JT] Donnajean Lynwood DEL, PA-C                                 Medical Decision Making Amount and/or Complexity of Data Reviewed Labs: ordered. Decision-making details documented in ED Course. Radiology: ordered.   This patient presents to the ED with chief complaint(s) of hypertension.  The complaint involves an extensive differential diagnosis and also carries with it a high risk of complications and morbidity.   Pertinent past medical history as listed in HPI  The differential diagnosis includes  Considered ACS however patient has no EKG changes and troponins without elevation.  Considered fluid overload however workup is not consistent with this.  Chest x-ray does not demonstrate pneumonia. Additional history obtained: Additional history obtained from family Records reviewed Care Everywhere/External Records  Disposition:   Signout given to Marolyn Dionisio, MD.  Please see his note for remainder the visit.  Disposition pending workup.  Social Determinants of Health:   none  This note was dictated with voice recognition software.  Despite best efforts at proofreading, errors may have occurred which can change the documentation meaning.       Final diagnoses:  Orthostatic hypotension     ED Discharge Orders     None          Donnajean Lynwood DEL, PA-C 08/06/24 1536    Pamella Sharper A, DO 08/09/24 1049

## 2024-08-06 NOTE — Discharge Instructions (Addendum)
 You were evaluated the emergency room for blood pressure management, dizziness and shortness of breath.  Your lab work and imaging do not show any significant abnormality.  Please follow with your primary care doctor for further management.  If you experience any new or worsening symptoms including worsening chest pain, shortness of breath, syncope please return to emergency room. You were found to have orthostatic hypotension. Please drink plenty of fluids and follow-up with your primary care doctor regarding your blood pressure management.

## 2024-08-06 NOTE — ED Triage Notes (Signed)
 Pt. BIB [facility] from [facility] with c/o HTN; Pt. Reports feeling dizzy and some weakness due to a recent change in BP meds; Per [facility] while sitting BP: 170/80; and while standing BP: 110/80; Pt. Has no other complaints

## 2024-08-06 NOTE — ED Notes (Signed)
 Son taking pt. To facility; son is notifying facility

## 2024-08-10 ENCOUNTER — Ambulatory Visit (INDEPENDENT_AMBULATORY_CARE_PROVIDER_SITE_OTHER): Admitting: Internal Medicine

## 2024-08-10 ENCOUNTER — Encounter: Payer: Self-pay | Admitting: Internal Medicine

## 2024-08-10 VITALS — BP 120/72 | HR 68 | Temp 98.4°F | Ht 60.0 in | Wt 115.0 lb

## 2024-08-10 DIAGNOSIS — F411 Generalized anxiety disorder: Secondary | ICD-10-CM

## 2024-08-10 DIAGNOSIS — I10 Essential (primary) hypertension: Secondary | ICD-10-CM | POA: Diagnosis not present

## 2024-08-10 DIAGNOSIS — R0989 Other specified symptoms and signs involving the circulatory and respiratory systems: Secondary | ICD-10-CM | POA: Diagnosis not present

## 2024-08-10 NOTE — Progress Notes (Unsigned)
 Subjective:  Patient ID: [name] Caller, female    DOB: 05-Aug-1941  Age: 83 y.o. MRN: [id number]  CC: Hospitalization Follow-up (Patient is wondering why her blood pressure keeps going up and down. Hospital recommended eat and drink more and change meds. )   HPI [name] presents for post-ER visit for labile blood pressure.  The patient is here with her son Karleen. Her blood pressure goes up and down.  She feels much better with her blood pressure is high and much worse when the blood pressure is low. When she reports her blood pressure being in the 180-190 range she is usually sent to ER to be checked out which is quite stressful. She does have a lot of anxiety and gets panicky about her blood pressure and not feeling well  Outpatient Medications Prior to Visit  Medication Sig Dispense Refill   amLODipine  (NORVASC ) 2.5 MG tablet Take 1 tablet (2.5 mg total) by mouth daily. 30 tablet 11   b complex vitamins capsule Take 1 capsule by mouth daily.     bisacodyl  5 MG EC tablet Take 1 tablet (5 mg total) by mouth as directed. As instructed for bowel purge 8 tablet 0   Cholecalciferol (VITAMIN D3) 125 MCG (5000 UT) CAPS Take 1 capsule (5,000 Units total) by mouth daily.     denosumab  (PROLIA ) 60 MG/ML SOSY injection Inject 60 mg into the skin every 6 (six) months.     docusate sodium  (COLACE) 100 MG capsule Take 1 capsule (100 mg total) by mouth 2 (two) times daily. 60 capsule 5   loperamide  (IMODIUM  A-D) 2 MG tablet Take 1 tablet (2 mg total) by mouth daily as needed for diarrhea or loose stools. 30 tablet 5   olmesartan  (BENICAR ) 20 MG tablet Take 1 tablet (20 mg total) by mouth 2 (two) times daily. 180 tablet 3   polyethylene glycol (MIRALAX  / GLYCOLAX ) 17 g packet Take 17 g by mouth daily as needed for mild constipation.     furosemide  (LASIX ) 20 MG tablet Take 1 tablet (20 mg total) by mouth daily for 3 days only, then as needed once per week for swelling thereafter. (Patient not taking:  Reported on 08/10/2024) 10 tablet 1   Ibuprofen  (ADVIL  PO) Take by mouth. (Patient not taking: Reported on 08/10/2024)     linaclotide  (LINZESS ) 72 MCG capsule Take 1 capsule (72 mcg total) by mouth daily before breakfast. 30 capsule 5   LORazepam  (ATIVAN ) 0.5 MG tablet Take 1 tablet (0.5 mg total) by mouth 2 (two) times daily as needed for anxiety. for anxiety (Patient not taking: Reported on 08/10/2024) 60 tablet 5   Vibegron (GEMTESA PO) Take by mouth. Urinary incontinence     No facility-administered medications prior to visit.    ROS: Review of Systems  Constitutional:  Negative for activity change, appetite change, chills, fatigue and unexpected weight change.  HENT:  Negative for congestion, mouth sores and sinus pressure.   Eyes:  Negative for visual disturbance.  Respiratory:  Negative for cough and chest tightness.   Gastrointestinal:  Negative for abdominal pain and nausea.  Genitourinary:  Negative for difficulty urinating, frequency and vaginal pain.  Musculoskeletal:  Positive for gait problem. Negative for back pain.  Skin:  Negative for pallor and rash.  Neurological:  Negative for dizziness, tremors, weakness, numbness and headaches.  Psychiatric/Behavioral:  Positive for decreased concentration. Negative for confusion, sleep disturbance and suicidal ideas. The patient is nervous/anxious.     Objective:  BP 120/72   Pulse 68   Temp 98.4 F (36.9 C) (Oral)   Ht 5' (1.524 m)   Wt 115 lb (52.2 kg)   SpO2 98%   BMI 22.46 kg/m   BP Readings from Last 3 Encounters:  08/10/24 120/72  08/06/24 (!) 142/79  08/04/24 130/68    Wt Readings from Last 3 Encounters:  08/10/24 115 lb (52.2 kg)  08/06/24 117 lb (53.1 kg)  08/04/24 118 lb 2 oz (53.6 kg)    Physical Exam Constitutional:      General: She is not in acute distress.    Appearance: Normal appearance. She is well-developed. She is not ill-appearing.  HENT:     Head: Normocephalic.     Right Ear: External ear  normal.     Left Ear: External ear normal.     Nose: Nose normal.  Eyes:     General:        Right eye: No discharge.        Left eye: No discharge.     Conjunctiva/sclera: Conjunctivae normal.     Pupils: Pupils are equal, round, and reactive to light.  Neck:     Thyroid : No thyromegaly.     Vascular: No JVD.     Trachea: No tracheal deviation.  Cardiovascular:     Rate and Rhythm: Normal rate and regular rhythm.     Heart sounds: Normal heart sounds.  Pulmonary:     Effort: No respiratory distress.     Breath sounds: No stridor. No wheezing.  Abdominal:     General: Bowel sounds are normal. There is no distension.     Palpations: Abdomen is soft. There is no mass.     Tenderness: There is no abdominal tenderness. There is no guarding or rebound.  Musculoskeletal:        General: No tenderness.     Cervical back: Normal range of motion and neck supple. No rigidity.  Lymphadenopathy:     Cervical: No cervical adenopathy.  Skin:    Findings: No erythema or rash.  Neurological:     Mental Status: Mental status is at baseline.     Cranial Nerves: No cranial nerve deficit.     Motor: No abnormal muscle tone.     Coordination: Coordination normal.     Gait: Gait abnormal.     Deep Tendon Reflexes: Reflexes normal.  Psychiatric:        Behavior: Behavior normal.        Thought Content: Thought content normal.        Judgment: Judgment normal.     Lab Results  Component Value Date   WBC 6.2 08/06/2024   HGB 14.3 08/06/2024   HCT 43.7 08/06/2024   PLT 206 08/06/2024   GLUCOSE 105 (H) 08/06/2024   CHOL 132 01/25/2015   TRIG 64 01/25/2015   HDL 68 01/25/2015   LDLDIRECT 156.3 03/28/2011   LDLCALC 51 01/25/2015   ALT 17 08/06/2024   AST 20 08/06/2024   NA 139 08/06/2024   K 4.2 08/06/2024   CL 105 08/06/2024   CREATININE 0.81 08/06/2024   BUN 24 (H) 08/06/2024   CO2 25 08/06/2024   TSH 1.26 12/23/2023   INR 1.0 04/01/2017    DG Chest 2 View Result Date:  08/06/2024 EXAM: 2 VIEW(S) XRAY OF THE CHEST 08/06/2024 02:28:00 PM COMPARISON: 04/03/2022 CLINICAL HISTORY: SOB. Reason for exam: SOB; Hx of FINDINGS: LUNGS AND PLEURA: No focal pulmonary opacity. No pulmonary edema. Small bilateral  pleural effusions suspected. No pneumothorax. HEART AND MEDIASTINUM: Tortuous thoracic aorta. No acute abnormality of the cardiac and mediastinal silhouettes. BONES AND SOFT TISSUES: Postsurgical changes of right thoracic inlet. Midthoracic compression deformities. Multilevel degenerative changes of thoracic spine. No acute osseous abnormality. IMPRESSION: 1. Small bilateral pleural effusions suspected. 2. Tortuous thoracic aorta. 3. Postsurgical changes of right thoracic inlet. 4. Midthoracic compression deformities and multilevel degenerative changes of thoracic spine. Electronically signed by: Waddell Calk MD 08/06/2024 02:48 PM EDT RP Workstation: [id number]    Assessment & Plan:   See A/P (not syncing)  Check your blood pressure before you take your meds.   If your BP<120/80 in am or in PM do not take your Olmesartan   If your top BP<100, have a cup of tea and something salty, ie pretzels  If your top BP>190 take an extra Olmesartan  tablet and/or an extra Lorazepam  tablet   No orders of the defined types were placed in this encounter.     Follow-up: Return in about 6 weeks (around 09/21/2024) for a follow-up visit.  Marolyn Noel, MD

## 2024-08-10 NOTE — Patient Instructions (Addendum)
 Check your blood pressure before you take your meds.   If your BP<120/80 in am or in PM do not take your Olmesartan   If your top BP<100, have a cup of tea and something salty, ie pretzels  If your top BP>190 take an extra Olmesartan  tablet and/or an extra Lorazepam  tablet

## 2024-08-10 NOTE — Assessment & Plan Note (Addendum)
 Lorazepam  is the only thing that brings BP down -no side effects if she takes 1/2 tablet.  If she is taking a full tablet, she has to take a nap sometimes.  Check your blood pressure before you take your meds.   If your BP<120/80 in am or in PM do not take your Olmesartan   If your top BP<100, have a cup of tea and something salty, ie pretzels  If your top BP>190 take an extra Olmesartan  tablet and/or an extra Lorazepam  tablet

## 2024-08-11 NOTE — Assessment & Plan Note (Signed)
 Lorazepam  helps a lot and is normalizing her BP. Ok to use BID Potential benefits of a long term benzodiazepines  use as well as potential risks  and complications were explained to the patient and were aknowledged.

## 2024-08-11 NOTE — Assessment & Plan Note (Signed)
 Problem List Items Addressed This Visit     Labile blood pressure - Primary   Lorazepam  is the only thing that brings BP down -no side effects if she takes 1/2 tablet.  If she is taking a full tablet, she has to take a nap sometimes.  Check your blood pressure before you take your meds.   If your BP<120/80 in am or in PM do not take your Olmesartan   If your top BP<100, have a cup of tea and something salty, ie pretzels  If your top BP>190 take an extra Olmesartan  tablet and/or an extra Lorazepam  tablet

## 2024-08-12 DIAGNOSIS — M545 Low back pain, unspecified: Secondary | ICD-10-CM | POA: Diagnosis not present

## 2024-08-12 DIAGNOSIS — R2681 Unsteadiness on feet: Secondary | ICD-10-CM | POA: Diagnosis not present

## 2024-08-12 DIAGNOSIS — F02B0 Dementia in other diseases classified elsewhere, moderate, without behavioral disturbance, psychotic disturbance, mood disturbance, and anxiety: Secondary | ICD-10-CM | POA: Diagnosis not present

## 2024-08-12 DIAGNOSIS — R41841 Cognitive communication deficit: Secondary | ICD-10-CM | POA: Diagnosis not present

## 2024-08-12 DIAGNOSIS — G301 Alzheimer's disease with late onset: Secondary | ICD-10-CM | POA: Diagnosis not present

## 2024-08-12 DIAGNOSIS — M810 Age-related osteoporosis without current pathological fracture: Secondary | ICD-10-CM | POA: Diagnosis not present

## 2024-08-17 DIAGNOSIS — Z23 Encounter for immunization: Secondary | ICD-10-CM | POA: Diagnosis not present

## 2024-08-17 DIAGNOSIS — M545 Low back pain, unspecified: Secondary | ICD-10-CM | POA: Diagnosis not present

## 2024-08-17 DIAGNOSIS — G301 Alzheimer's disease with late onset: Secondary | ICD-10-CM | POA: Diagnosis not present

## 2024-08-17 DIAGNOSIS — M810 Age-related osteoporosis without current pathological fracture: Secondary | ICD-10-CM | POA: Diagnosis not present

## 2024-08-17 DIAGNOSIS — F02B0 Dementia in other diseases classified elsewhere, moderate, without behavioral disturbance, psychotic disturbance, mood disturbance, and anxiety: Secondary | ICD-10-CM | POA: Diagnosis not present

## 2024-08-17 DIAGNOSIS — R2681 Unsteadiness on feet: Secondary | ICD-10-CM | POA: Diagnosis not present

## 2024-08-17 DIAGNOSIS — R41841 Cognitive communication deficit: Secondary | ICD-10-CM | POA: Diagnosis not present

## 2024-08-18 DIAGNOSIS — F02B0 Dementia in other diseases classified elsewhere, moderate, without behavioral disturbance, psychotic disturbance, mood disturbance, and anxiety: Secondary | ICD-10-CM | POA: Diagnosis not present

## 2024-08-18 DIAGNOSIS — R41841 Cognitive communication deficit: Secondary | ICD-10-CM | POA: Diagnosis not present

## 2024-08-18 DIAGNOSIS — M4854XA Collapsed vertebra, not elsewhere classified, thoracic region, initial encounter for fracture: Secondary | ICD-10-CM | POA: Diagnosis not present

## 2024-08-18 DIAGNOSIS — M810 Age-related osteoporosis without current pathological fracture: Secondary | ICD-10-CM | POA: Diagnosis not present

## 2024-08-18 DIAGNOSIS — G301 Alzheimer's disease with late onset: Secondary | ICD-10-CM | POA: Diagnosis not present

## 2024-08-18 DIAGNOSIS — R2681 Unsteadiness on feet: Secondary | ICD-10-CM | POA: Diagnosis not present

## 2024-08-18 DIAGNOSIS — M545 Low back pain, unspecified: Secondary | ICD-10-CM | POA: Diagnosis not present

## 2024-08-20 ENCOUNTER — Telehealth: Payer: Self-pay | Admitting: Cardiology

## 2024-08-20 DIAGNOSIS — G301 Alzheimer's disease with late onset: Secondary | ICD-10-CM | POA: Diagnosis not present

## 2024-08-20 DIAGNOSIS — M810 Age-related osteoporosis without current pathological fracture: Secondary | ICD-10-CM | POA: Diagnosis not present

## 2024-08-20 DIAGNOSIS — F02B0 Dementia in other diseases classified elsewhere, moderate, without behavioral disturbance, psychotic disturbance, mood disturbance, and anxiety: Secondary | ICD-10-CM | POA: Diagnosis not present

## 2024-08-20 DIAGNOSIS — R2681 Unsteadiness on feet: Secondary | ICD-10-CM | POA: Diagnosis not present

## 2024-08-20 DIAGNOSIS — R41841 Cognitive communication deficit: Secondary | ICD-10-CM | POA: Diagnosis not present

## 2024-08-20 DIAGNOSIS — M545 Low back pain, unspecified: Secondary | ICD-10-CM | POA: Diagnosis not present

## 2024-08-20 NOTE — Telephone Encounter (Signed)
 Spoke with patient regarding blood pressure readings  Per patient she has SB readings that drop under 100  Takes Olmesartan  twice a day and Amlodipine  in the evening  Blood pressure 184/103 this am and after medications 135/87 Feels poorly when SBP drops in 130's or below, feel much better when higher  Was in ED 9/19  Saw PCP 9/23, below recommendations  Check your blood pressure before you take your meds.    If your BP<120/80 in am or in PM do not take your Olmesartan    If your top BP<100, have a cup of tea and something salty, ie pretzels   If your top BP>190 take an extra Olmesartan  tablet and/or an extra Lorazepam  tablet  Will forward to Dr Lonni for review

## 2024-08-20 NOTE — Telephone Encounter (Signed)
 Pt c/o BP issue: STAT if pt c/o blurred vision, one-sided weakness or slurred speech.  STAT if BP is GREATER than 180/120 TODAY.  STAT if BP is LESS than 90/60 and SYMPTOMATIC TODAY  1. What is your BP concern? Pt concerned her bp has been high   2. Have you taken any BP medication today? Yes   3. What are your last 5 BP readings?  184/103 - this morning  135/87 - currently after meds   4. Are you having any other symptoms (ex. Dizziness, headache, blurred vision, passed out)?  Lightheaded at times

## 2024-08-20 NOTE — Telephone Encounter (Signed)
 Reviewed with Dr. Lonni.  Patient needs visit.  Called and left detailed message of appointment availability for 08/23/24 at 10:20 am.  Scheduled her.  Will send mychart message as well to let her know.

## 2024-08-23 ENCOUNTER — Encounter (HOSPITAL_BASED_OUTPATIENT_CLINIC_OR_DEPARTMENT_OTHER): Payer: Self-pay | Admitting: Cardiology

## 2024-08-23 ENCOUNTER — Ambulatory Visit (INDEPENDENT_AMBULATORY_CARE_PROVIDER_SITE_OTHER): Admitting: Cardiology

## 2024-08-23 VITALS — HR 61 | Ht 60.0 in | Wt 116.8 lb

## 2024-08-23 DIAGNOSIS — K5901 Slow transit constipation: Secondary | ICD-10-CM

## 2024-08-23 DIAGNOSIS — I951 Orthostatic hypotension: Secondary | ICD-10-CM | POA: Diagnosis not present

## 2024-08-23 DIAGNOSIS — R0989 Other specified symptoms and signs involving the circulatory and respiratory systems: Secondary | ICD-10-CM | POA: Diagnosis not present

## 2024-08-23 DIAGNOSIS — R609 Edema, unspecified: Secondary | ICD-10-CM | POA: Diagnosis not present

## 2024-08-23 MED ORDER — AMLODIPINE BESYLATE 2.5 MG PO TABS: 2.5000 mg | ORAL_TABLET | Freq: Every day | ORAL | 11 refills | Status: DC

## 2024-08-23 NOTE — Progress Notes (Signed)
 Cardiology Office Note:  .   Date:  08/23/2024  ID:  [name], DOB 1940-12-08, MRN [id number] PCP: Garald [name], MD  [facility] Providers Cardiologist:  Shelda Bruckner, MD {  History of Present Illness: [name] is a 83 y.o. female with a hx of HTN, CAD, HLD, paroxysmal atrial fibrillation (during hospitalization in 2017) who is seen for follow up today. I initially met her 07/19/2019 as a new patient to me/prior Dr. Mady for the evaluation and management of cardiovascular disease.   Med history: Typical day: wakes up around 8-9 AM, Usually doesn't eat breakfast, has a cup of tea (with caffeine). Takes olmesartan  around 10 AM. Doesn't usually eat the rest of the morning as it makes her sick. Eats early lunch around 11:30, usually about 1/2 sandwich. Feels her best around noon. Does well in the afternoon. Dinner is 5:30-6, eats well. Feels well in the evening. Takes her PM meds about 9 PM.   Today: Feels like she has no energy. Has been doing PT, thinks this might be helping. She doesn't eat well, has dinner and sometimes a little food in the morning, but no real mid-day meal. Trying to drink more fluids, at least 30 oz/day.   She called 9/18 with elevated BP readings in the 190-200 range systolic, stated that she did not feel well, send to ER. Overall she reports rare BP readings above 150, feels better with higher numbers rather than lower ones.  She hasn't taken any pills this AM but did take olmesartan  and amlodipine  last night. If she takes olmesartan , her BP drops way down. She was orthostatic today on vitals, going from 169/68 to 118/74 from lying to standing. She has had some readings with systolics in the 90s.   ROS: Denies chest pain, shortness of breath at rest or with normal exertion. No PND, orthopnea, LE edema or unexpected weight gain. No syncope. ROS otherwise negative except as noted.   Studies Reviewed: Teresa    EKG:       Physical Exam:    VS:  Pulse 61   Ht 5' (1.524 m)   Wt 116 lb 12.8 oz (53 kg)   SpO2 99%   BMI 22.81 kg/m    Wt Readings from Last 3 Encounters:  08/23/24 116 lb 12.8 oz (53 kg)  08/10/24 115 lb (52.2 kg)  08/06/24 117 lb (53.1 kg)    Orthostatic VS for the past 24 hrs (Last 3 readings):  BP- Lying Pulse- Lying BP- Sitting Pulse- Sitting BP- Standing at 0 minutes Pulse- Standing at 0 minutes BP- Standing at 3 minutes Pulse- Standing at 3 minutes  08/23/24 1025 169/68 57 154/71 60 118/74 66 129/69 (!) 43     GEN: Well nourished, well developed in no acute distress HEENT: Normal, moist mucous membranes NECK: No JVD CARDIAC: regular rhythm, normal S1 and S2, no rubs or gallops. No murmur. VASCULAR: Radial and DP pulses 2+ bilaterally. No carotid bruits RESPIRATORY:  Clear to auscultation without rales, wheezing or rhonchi  ABDOMEN: Soft, non-tender, non-distended MUSCULOSKELETAL:  Ambulates independently with rollator SKIN: Warm and dry, no edema NEUROLOGIC:  Alert and oriented x 3. No focal neuro deficits noted. PSYCHIATRIC:  Normal affect    ASSESSMENT AND PLAN: .     Hypertension: labile, with orthostasis -we discussed the orthostasis today, reviewed compression stockings and need for better hydration. She does note improvement in her symptoms with compression stockings. She also notes that this  helps a lot with her swelling too -we discussed olmesartan  dosing. She has this ordered BID but we discussed hold parameters today -we discussed amlodipine . Not ideal as it is a vasodilator, but it is steady, and she is on a low dose. Will continue the 2.5 mg dose for now -limited options for meds. No beta blocker due to history of bradycardia. With intermittent low BP, would like to avoid thiazide/loop/MRA to avoid dehydration/orthostasis. Also has history of kidney stones. -we have also discussed hydralazine , clonidine, and doxazosin. If lability persists and not able to manage with stress  reduction/anxiety meds, may need to consider PRN hydralazine  in the future. Concern would be that BP lowers with resting, so even a PRN could potentially cause symptomatic hypotension if it is only a transiently elevated BP.  -discussed that midodrine not ideal given supine hypertension risk, and she is not persistently hypotensive, mostly just with position changes -we also discussed ambulatory blood pressure monitoring if needed to better determine her pattern  nonobstructive CAD Hypercholesterolemia, LDL goal <70 -on cath 2018 -continue aspirin , atorvastatin  -counseled on red flag warning signs that need immediate medical attention  Dispo: 2-3 weeks to follow up blood pressure patterns  Signed, Shelda Bruckner, MD   Shelda Bruckner, MD, PhD, [facility]  [facility]  [place]  [facility] at [facility] at [facility] [address] [place], [place] [address] [phone]

## 2024-08-23 NOTE — Patient Instructions (Signed)
 Medication Instructions:   Your physician recommends that you continue on your current medications as directed. Please refer to the Current Medication list given to you today.   *If you need a refill on your cardiac medications before your next appointment, please call your pharmacy*  Lab Work:  None ordered.  If you have labs (blood work) drawn today and your tests are completely normal, you will receive your results only by: MyChart Message (if you have MyChart) OR A paper copy in the mail If you have any lab test that is abnormal or we need to change your treatment, we will call you to review the results.  Testing/Procedures:  None ordered.  Follow-Up: At [facility], you and your health needs are our priority.  As part of our continuing mission to provide you with exceptional heart care, our providers are all part of one team.  This team includes your primary Cardiologist (physician) and Advanced Practice Providers or APPs (Physician Assistants and Nurse Practitioners) who all work together to provide you with the care you need, when you need it.  Your next appointment:   3 week(s)  Provider:   Reche Finder, NP    We recommend signing up for the patient portal called MyChart.  Sign up information is provided on this After Visit Summary.  MyChart is used to connect with patients for Virtual Visits (Telemedicine).  Patients are able to view lab/test results, encounter notes, upcoming appointments, etc.  Non-urgent messages can be sent to your provider as well.   To learn more about what you can do with MyChart, go to [email or web address].   Other Instructions        Plan for blood pressure: -keep amlodipine  2.5 mg dose daily -unless blood pressure very low (less than 110 on the top number), take evening dose of olmesartan  20 mg -in the morning, if your blood pressure is more than 160, take the morning olmesartan  dose of 20 mg -see if this helps your  fatigue/energy levels -wear compression stockings daily -stay very hydrated, aim for 60-90 oz of water every day -slow position changes, especially when standing -if you are noticing that you are having a lot of blood pressures in the 190s-200s, please let us  know. We may have to give you a shorter acting as needed medication called hydralazine  if this is happening a lot.  If you can, keep a log of blood pressures, when you took medications, how you felt

## 2024-08-24 ENCOUNTER — Non-Acute Institutional Stay: Admitting: Internal Medicine

## 2024-08-24 ENCOUNTER — Encounter: Payer: Self-pay | Admitting: Internal Medicine

## 2024-08-24 VITALS — BP 136/82 | HR 68 | Temp 98.1°F | Ht 58.27 in | Wt 118.2 lb

## 2024-08-24 DIAGNOSIS — R0989 Other specified symptoms and signs involving the circulatory and respiratory systems: Secondary | ICD-10-CM | POA: Diagnosis not present

## 2024-08-24 DIAGNOSIS — R2681 Unsteadiness on feet: Secondary | ICD-10-CM | POA: Diagnosis not present

## 2024-08-24 DIAGNOSIS — Z1231 Encounter for screening mammogram for malignant neoplasm of breast: Secondary | ICD-10-CM

## 2024-08-24 DIAGNOSIS — M810 Age-related osteoporosis without current pathological fracture: Secondary | ICD-10-CM | POA: Diagnosis not present

## 2024-08-24 DIAGNOSIS — M545 Low back pain, unspecified: Secondary | ICD-10-CM | POA: Diagnosis not present

## 2024-08-24 DIAGNOSIS — K5901 Slow transit constipation: Secondary | ICD-10-CM | POA: Diagnosis not present

## 2024-08-24 DIAGNOSIS — F411 Generalized anxiety disorder: Secondary | ICD-10-CM

## 2024-08-24 DIAGNOSIS — F02B0 Dementia in other diseases classified elsewhere, moderate, without behavioral disturbance, psychotic disturbance, mood disturbance, and anxiety: Secondary | ICD-10-CM | POA: Diagnosis not present

## 2024-08-24 DIAGNOSIS — G301 Alzheimer's disease with late onset: Secondary | ICD-10-CM | POA: Diagnosis not present

## 2024-08-24 DIAGNOSIS — N3281 Overactive bladder: Secondary | ICD-10-CM

## 2024-08-24 DIAGNOSIS — R41841 Cognitive communication deficit: Secondary | ICD-10-CM | POA: Diagnosis not present

## 2024-08-25 DIAGNOSIS — R2681 Unsteadiness on feet: Secondary | ICD-10-CM | POA: Diagnosis not present

## 2024-08-25 DIAGNOSIS — R41841 Cognitive communication deficit: Secondary | ICD-10-CM | POA: Diagnosis not present

## 2024-08-25 DIAGNOSIS — G301 Alzheimer's disease with late onset: Secondary | ICD-10-CM | POA: Diagnosis not present

## 2024-08-25 DIAGNOSIS — F02B0 Dementia in other diseases classified elsewhere, moderate, without behavioral disturbance, psychotic disturbance, mood disturbance, and anxiety: Secondary | ICD-10-CM | POA: Diagnosis not present

## 2024-08-25 DIAGNOSIS — M810 Age-related osteoporosis without current pathological fracture: Secondary | ICD-10-CM | POA: Diagnosis not present

## 2024-08-25 DIAGNOSIS — M545 Low back pain, unspecified: Secondary | ICD-10-CM | POA: Diagnosis not present

## 2024-08-27 DIAGNOSIS — R2681 Unsteadiness on feet: Secondary | ICD-10-CM | POA: Diagnosis not present

## 2024-08-27 DIAGNOSIS — G301 Alzheimer's disease with late onset: Secondary | ICD-10-CM | POA: Diagnosis not present

## 2024-08-27 DIAGNOSIS — R41841 Cognitive communication deficit: Secondary | ICD-10-CM | POA: Diagnosis not present

## 2024-08-29 NOTE — Progress Notes (Unsigned)
 Location:  [facility] features editor of Service:  Clinic (12)  Provider:   Code Status:  Goals of Care:     08/06/2024   12:30 PM  Advanced Directives  Does Patient Have a Medical Advance Directive? Yes     Chief Complaint  Patient presents with   Establish Care    Patient stated she has received flu and COVID vaccine at [facility] ? Patient stated she's having trouble managing her own medications.  Received patients medication list from Visit patient had yesterday.    Hospitalization Follow-up    Encounter from 9/19/202    HPI: Patient is a 83 y.o. female seen today for medical management of chronic diseases.    [place] in [place]  Patient has h/o Labile HTN, CAD, PAF HLD    Discussed the use of AI scribe software for clinical note transcription with the patient, who gave verbal consent to proceed.  History of Present Illness   Roshana Shuffield is an 83 year old female with hypertension who presents with concerns about blood pressure management. Labile BP Sees Cardiology She experiences significant fluctuations in blood pressure, with occasional readings exceeding 200 mmHg and typically high morning readings around 180-190 mmHg. She takes olmesartan  twice daily and amlodipine  once daily but finds the regimen confusing, especially regarding timing based on blood pressure readings. She feels jittery with high blood pressure and lethargic with low blood pressure. Tremors and uncoordinated leg movements have led to falls, despite using a walker.  She has a history of a broken elbow with surgery, resulting in limited motion and occasional pain. She has osteoporosis and receives Prolia  injections, with concerns about timing and continuation of treatment.  She experiences constipation, using Colace and Miralax  in the evening, and has not started Linzess  due to cost. She manages back pain with Advil  and experiences low energy in the mornings,  which she attributes to low blood pressure, alleviated by salty snacks.  She lives independently in an apartment, with assistance for transportation and household tasks.      Past Medical History:  Diagnosis Date   Abdominal pain, unspecified site    ADVERSE REACTION TO MEDICATION 07/31/2009   ANEMIA-NOS 07/07/2007   Anxiety    Arthritis    Chest pain, unspecified    Chronic kidney disease    hx of kidney stone   COLONIC POLYPS, ADENOMATOUS, HX OF    Complication of anesthesia    Dementia (HCC)    Disturbance of skin sensation    DIVERTICULOSIS, COLON 02/23/2009   Dyspnea    Family history of diabetes mellitus    Family history of malignant neoplasm of breast    FATIGUE 05/25/2009   Fever, unspecified    Heart murmur    hx of   HYPERLIPIDEMIA 07/07/2007   HYPERPARATHYROIDISM UNSPECIFIED 10/20/2007   HYPERTENSION 02/04/2008   Incomplete right bundle branch block (RBBB) determined by electrocardiography 04/15/2023   Irritable bowel syndrome (IBS)    NEPHROLITHIASIS, HX OF 11/15/2008   OSTEOPOROSIS 01/01/2008   Other acquired absence of organ    parathyroidectomy   Pain in limb    PALPITATIONS, OCCASIONAL 07/12/2008   PARATHYROIDECTOMY 01/02/2009   PONV (postoperative nausea and vomiting)    SCOLIOSIS 05/25/2009   Seasonal allergies    Unspecified constipation    URINARY INCONTINENCE 07/07/2007   UTI'S, RECURRENT 11/20/2009   kimbrough     Past Surgical History:  Procedure Laterality Date   BUNIONECTOMY  CATARACT EXTRACTION, BILATERAL     COLONOSCOPY     EXTRACORPOREAL SHOCK WAVE LITHOTRIPSY Left 12/21/2020   Procedure: EXTRACORPOREAL SHOCK WAVE LITHOTRIPSY (ESWL);  Surgeon: Nieves Cough, MD;  Location: [facility];  Service: Urology;  Laterality: Left;   LAPAROSCOPIC APPENDECTOMY  01/19/2016   Procedure: APPENDECTOMY LAPAROSCOPIC with orifice of cecal polyp;  Surgeon: Bernarda Ned, MD;  Location: [facility];  Service: General;;   LEFT HEART CATH  AND CORONARY ANGIOGRAPHY N/A 04/07/2017   Procedure: Left Heart Cath and Coronary Angiography;  Surgeon: Mady Bruckner, MD;  Location: [facility];  Service: Cardiovascular;  Laterality: N/A;   OLECRANON BURSECTOMY Left 10/21/2023   Procedure: OLECRANON (ELBOW) BURSA;  Surgeon: Beverley Evalene BIRCH, MD;  Location: [facility];  Service: Orthopedics;  Laterality: Left;   ORIF ELBOW FRACTURE Left 10/21/2023   Procedure: Open Reduction Internal fixation (ORIF), fracture, elbow/olecranon;  Surgeon: Beverley Evalene BIRCH, MD;  Location: [facility];  Service: Orthopedics;  Laterality: Left;   PARATHYROIDECTOMY     Rt Superior open neck exploration   POLYPECTOMY     RIGHT OOPHORECTOMY     TONSILLECTOMY      Allergies  Allergen Reactions   Anesthetics, Amide Other (See Comments)    Pt states she had hallucinations and HTN after procedure 01/19/16-she feels may have been reaction to anesthetic   Lisinopril Cough    Outpatient Encounter Medications as of 08/24/2024  Medication Sig   amLODipine  (NORVASC ) 2.5 MG tablet Take 1 tablet (2.5 mg total) by mouth daily.   b complex vitamins capsule Take 1 capsule by mouth daily.   bisacodyl  5 MG EC tablet Take 1 tablet (5 mg total) by mouth as directed. As instructed for bowel purge   Cholecalciferol (VITAMIN D3) 125 MCG (5000 UT) CAPS Take 1 capsule (5,000 Units total) by mouth daily.   denosumab  (PROLIA ) 60 MG/ML SOSY injection Inject 60 mg into the skin every 6 (six) months.   docusate sodium  (COLACE) 100 MG capsule Take 1 capsule (100 mg total) by mouth 2 (two) times daily.   furosemide  (LASIX ) 20 MG tablet Take 1 tablet (20 mg total) by mouth daily for 3 days only, then as needed once per week for swelling thereafter. (Patient taking differently: Take 20 mg by mouth as needed. Take 1 tablet (20 mg total) by mouth daily for 3 days only, then as needed once per week for swelling thereafter.)   Ibuprofen  (ADVIL  PO) Take by mouth. (Patient taking differently: Take by  mouth as needed.)   linaclotide  (LINZESS ) 72 MCG capsule Take 1 capsule (72 mcg total) by mouth daily before breakfast.   loperamide  (IMODIUM  A-D) 2 MG tablet Take 1 tablet (2 mg total) by mouth daily as needed for diarrhea or loose stools.   LORazepam  (ATIVAN ) 0.5 MG tablet Take 1 tablet (0.5 mg total) by mouth 2 (two) times daily as needed for anxiety. for anxiety   olmesartan  (BENICAR ) 20 MG tablet Take 1 tablet (20 mg total) by mouth 2 (two) times daily.   polyethylene glycol (MIRALAX  / GLYCOLAX ) 17 g packet Take 17 g by mouth daily as needed for mild constipation.   Vibegron (GEMTESA PO) Take by mouth. Urinary incontinence   [DISCONTINUED] amLODipine  (NORVASC ) 2.5 MG tablet Take 1 tablet (2.5 mg total) by mouth daily.   No facility-administered encounter medications on file as of 08/24/2024.    Review of Systems:  Review of Systems  Constitutional:  Negative for activity change and appetite change.  HENT: Negative.  Respiratory:  Negative for cough and shortness of breath.   Cardiovascular:  Negative for leg swelling.  Gastrointestinal:  Positive for constipation.  Genitourinary: Negative.   Musculoskeletal:  Positive for gait problem. Negative for arthralgias and myalgias.  Skin: Negative.   Neurological:  Positive for weakness. Negative for dizziness.  Psychiatric/Behavioral:  Negative for confusion, dysphoric mood and sleep disturbance. The patient is nervous/anxious.     Health Maintenance  Topic Date Due   Influenza Vaccine  06/18/2024   COVID-19 Vaccine (6 - 2025-26 season) 07/19/2024   DTaP/Tdap/Td (2 - Td or Tdap) 01/05/2025   Medicare Annual Wellness (AWV)  03/17/2025   Pneumococcal Vaccine: 50+ Years  Completed   DEXA SCAN  Completed   Zoster Vaccines- Shingrix  Completed   Meningococcal B Vaccine  Aged Out   Colonoscopy  Discontinued    Physical Exam: Vitals:   08/24/24 1508  BP: 136/82  Pulse: 68  Temp: 98.1 F (36.7 C)  SpO2: 95%  Weight: 118 lb 3.2  oz (53.6 kg)  Height: 4' 10.27 (1.48 m)   Body mass index is 24.48 kg/m. Physical Exam Vitals reviewed.  Constitutional:      Appearance: Normal appearance.  HENT:     Head: Normocephalic.     Nose: Nose normal.     Mouth/Throat:     Mouth: Mucous membranes are moist.     Pharynx: Oropharynx is clear.  Eyes:     Pupils: Pupils are equal, round, and reactive to light.  Cardiovascular:     Rate and Rhythm: Normal rate and regular rhythm.     Pulses: Normal pulses.     Heart sounds: Normal heart sounds. No murmur heard. Pulmonary:     Effort: Pulmonary effort is normal.     Breath sounds: Normal breath sounds.  Abdominal:     General: Abdomen is flat. Bowel sounds are normal.     Palpations: Abdomen is soft.  Musculoskeletal:        General: No swelling.     Cervical back: Neck supple.  Skin:    General: Skin is warm.  Neurological:     General: No focal deficit present.     Mental Status: She is alert and oriented to person, place, and time.  Psychiatric:        Mood and Affect: Mood normal.        Thought Content: Thought content normal.     Labs reviewed: Basic Metabolic Panel: Recent Labs    12/23/23 1612 06/28/24 1607 08/06/24 1315  NA 140 141 139  K 4.1 4.7 4.2  CL 105 105 105  CO2 27 18* 25  GLUCOSE 99 90 105*  BUN 19 21 24*  CREATININE 0.77 0.90 0.81  CALCIUM  10.3 10.3 9.8  TSH 1.26  --   --    Liver Function Tests: Recent Labs    12/23/23 1612 06/28/24 1607 08/06/24 1315  AST 18 25 20   ALT 15 13 17   ALKPHOS 64 63 47  BILITOT 0.7 0.9 1.3*  PROT 6.5 6.8 6.5  ALBUMIN 4.1 4.5 3.7   No results for input(s): LIPASE, AMYLASE in the last 8760 hours. No results for input(s): AMMONIA in the last 8760 hours. CBC: Recent Labs    12/23/23 1612 06/28/24 1607 08/06/24 1315  WBC 6.2 5.7 6.2  NEUTROABS 3.7  --  4.3  HGB 13.6 13.8 14.3  HCT 41.0 41.4 43.7  MCV 97.4 96 96.3  PLT 217.0 177 206   Lipid Panel: No  results for input(s):  CHOL, HDL, LDLCALC, TRIG, CHOLHDL, LDLDIRECT in the last 8760 hours. No results found for: HGBA1C  Procedures since last visit: DG Chest 2 View Result Date: 08/06/2024 EXAM: 2 VIEW(S) XRAY OF THE CHEST 08/06/2024 02:28:00 PM COMPARISON: 04/03/2022 CLINICAL HISTORY: SOB. Reason for exam: SOB; Hx of FINDINGS: LUNGS AND PLEURA: No focal pulmonary opacity. No pulmonary edema. Small bilateral pleural effusions suspected. No pneumothorax. HEART AND MEDIASTINUM: Tortuous thoracic aorta. No acute abnormality of the cardiac and mediastinal silhouettes. BONES AND SOFT TISSUES: Postsurgical changes of right thoracic inlet. Midthoracic compression deformities. Multilevel degenerative changes of thoracic spine. No acute osseous abnormality. IMPRESSION: 1. Small bilateral pleural effusions suspected. 2. Tortuous thoracic aorta. 3. Postsurgical changes of right thoracic inlet. 4. Midthoracic compression deformities and multilevel degenerative changes of thoracic spine. Electronically signed by: Waddell Calk MD 08/06/2024 02:48 PM EDT RP Workstation: [id number]    Assessment/Plan   Assessment and Plan    Hypertension with orthostatic hypotension Blood pressure management is challenging due to fluctuating readings and difficulty understanding medication instructions. - Monitor blood pressure daily and adjust medication as per cardiologist's instructions: if BP <110 mmHg, no meds; 110-160 mmHg, amlodipine ; >160 mmHg, amlodipine  and olmesartan . - Keep a blood pressure log and follow up with cardiologist in three weeks. - Educated on non-pharmacological measures such as consuming salty snacks to manage low blood pressure symptoms. - Discussed risks of medication for low blood pressure and emphasized non-pharmacological management.  Gait abnormality and lower extremity weakness Gait abnormality and lower extremity weakness with episodes of leg freezing. Uses a walker for stability and has experienced  falls. - Continue physical therapy to improve gait and strength. - Advise to walk slowly and use walker for stability to prevent falls.  Chronic lower extremity edema Chronic lower extremity edema has improved with use of compression stockings and intermittent Lasix . - Continue use of compression stockings for edema management. - Use Lasix  as needed for edema control.  Chronic constipation Chronic constipation managed with Colace and Miralax . Reports constipation may contribute to back pain. Uses Advil  for back pain, which may exacerbate constipation. - Continue Colace and Miralax  for constipation management. - Advise on potential constipation side effects of Advil  and monitor usage. - Discuss option of starting Linzess  if current regimen is ineffective.  Chronic back pain Chronic back pain, possibly exacerbated by constipation. Uses Advil  for pain relief, which is effective but may cause constipation and stomach irritation. Kidney function is currently stable. - Advise on risks of frequent Advil  use, including potential for constipation and stomach irritation. - Monitor kidney function due to Advil  use.  Urinary incontinence Urinary incontinence managed with Gemtesa. Reports it is somewhat effective but concerned about potential for increased constipation. - Continue Gemtesa for urinary incontinence management.  Osteoporosis on Prolia  therapy Osteoporosis managed with Prolia . Emphasized importance of continuing Prolia  to prevent further bone weakening. Informed that discontinuing Prolia  can lead to weaker bones. - Continue Prolia  therapy as prescribed by Dr. Lavell. - Order mammogram at [facility] for routine screening.      Generalized anxiety Ativan  Prn   Labs/tests ordered:  * No order type specified * Next appt:  10/19/2024

## 2024-08-30 DIAGNOSIS — R2681 Unsteadiness on feet: Secondary | ICD-10-CM | POA: Diagnosis not present

## 2024-08-30 DIAGNOSIS — G301 Alzheimer's disease with late onset: Secondary | ICD-10-CM | POA: Diagnosis not present

## 2024-08-30 DIAGNOSIS — R41841 Cognitive communication deficit: Secondary | ICD-10-CM | POA: Diagnosis not present

## 2024-08-31 DIAGNOSIS — M545 Low back pain, unspecified: Secondary | ICD-10-CM | POA: Diagnosis not present

## 2024-08-31 DIAGNOSIS — F02B0 Dementia in other diseases classified elsewhere, moderate, without behavioral disturbance, psychotic disturbance, mood disturbance, and anxiety: Secondary | ICD-10-CM | POA: Diagnosis not present

## 2024-08-31 DIAGNOSIS — R2681 Unsteadiness on feet: Secondary | ICD-10-CM | POA: Diagnosis not present

## 2024-08-31 DIAGNOSIS — R41841 Cognitive communication deficit: Secondary | ICD-10-CM | POA: Diagnosis not present

## 2024-08-31 DIAGNOSIS — G301 Alzheimer's disease with late onset: Secondary | ICD-10-CM | POA: Diagnosis not present

## 2024-08-31 DIAGNOSIS — M810 Age-related osteoporosis without current pathological fracture: Secondary | ICD-10-CM | POA: Diagnosis not present

## 2024-09-01 DIAGNOSIS — M810 Age-related osteoporosis without current pathological fracture: Secondary | ICD-10-CM | POA: Diagnosis not present

## 2024-09-01 DIAGNOSIS — F02B0 Dementia in other diseases classified elsewhere, moderate, without behavioral disturbance, psychotic disturbance, mood disturbance, and anxiety: Secondary | ICD-10-CM | POA: Diagnosis not present

## 2024-09-01 DIAGNOSIS — R2681 Unsteadiness on feet: Secondary | ICD-10-CM | POA: Diagnosis not present

## 2024-09-01 DIAGNOSIS — G301 Alzheimer's disease with late onset: Secondary | ICD-10-CM | POA: Diagnosis not present

## 2024-09-01 DIAGNOSIS — M545 Low back pain, unspecified: Secondary | ICD-10-CM | POA: Diagnosis not present

## 2024-09-01 DIAGNOSIS — R41841 Cognitive communication deficit: Secondary | ICD-10-CM | POA: Diagnosis not present

## 2024-09-02 DIAGNOSIS — M810 Age-related osteoporosis without current pathological fracture: Secondary | ICD-10-CM | POA: Diagnosis not present

## 2024-09-02 DIAGNOSIS — G301 Alzheimer's disease with late onset: Secondary | ICD-10-CM | POA: Diagnosis not present

## 2024-09-02 DIAGNOSIS — M545 Low back pain, unspecified: Secondary | ICD-10-CM | POA: Diagnosis not present

## 2024-09-02 DIAGNOSIS — F02B0 Dementia in other diseases classified elsewhere, moderate, without behavioral disturbance, psychotic disturbance, mood disturbance, and anxiety: Secondary | ICD-10-CM | POA: Diagnosis not present

## 2024-09-02 DIAGNOSIS — R2681 Unsteadiness on feet: Secondary | ICD-10-CM | POA: Diagnosis not present

## 2024-09-02 DIAGNOSIS — R41841 Cognitive communication deficit: Secondary | ICD-10-CM | POA: Diagnosis not present

## 2024-09-03 DIAGNOSIS — R41841 Cognitive communication deficit: Secondary | ICD-10-CM | POA: Diagnosis not present

## 2024-09-03 DIAGNOSIS — R2681 Unsteadiness on feet: Secondary | ICD-10-CM | POA: Diagnosis not present

## 2024-09-03 DIAGNOSIS — G301 Alzheimer's disease with late onset: Secondary | ICD-10-CM | POA: Diagnosis not present

## 2024-09-06 DIAGNOSIS — G301 Alzheimer's disease with late onset: Secondary | ICD-10-CM | POA: Diagnosis not present

## 2024-09-06 DIAGNOSIS — R2681 Unsteadiness on feet: Secondary | ICD-10-CM | POA: Diagnosis not present

## 2024-09-06 DIAGNOSIS — R41841 Cognitive communication deficit: Secondary | ICD-10-CM | POA: Diagnosis not present

## 2024-09-07 ENCOUNTER — Telehealth: Payer: Self-pay

## 2024-09-07 DIAGNOSIS — G301 Alzheimer's disease with late onset: Secondary | ICD-10-CM | POA: Diagnosis not present

## 2024-09-07 DIAGNOSIS — R41841 Cognitive communication deficit: Secondary | ICD-10-CM | POA: Diagnosis not present

## 2024-09-07 DIAGNOSIS — M545 Low back pain, unspecified: Secondary | ICD-10-CM | POA: Diagnosis not present

## 2024-09-07 DIAGNOSIS — F02B0 Dementia in other diseases classified elsewhere, moderate, without behavioral disturbance, psychotic disturbance, mood disturbance, and anxiety: Secondary | ICD-10-CM | POA: Diagnosis not present

## 2024-09-07 DIAGNOSIS — M810 Age-related osteoporosis without current pathological fracture: Secondary | ICD-10-CM | POA: Diagnosis not present

## 2024-09-07 DIAGNOSIS — R2681 Unsteadiness on feet: Secondary | ICD-10-CM | POA: Diagnosis not present

## 2024-09-07 NOTE — Telephone Encounter (Signed)
 Spoke with patient and confirmed the PCP status has been updated to Dr. Charlanne

## 2024-09-07 NOTE — Telephone Encounter (Signed)
 Copied from CRM #[id number]. Topic: Appointments - Transfer of Care >> Sep 07, 2024 12:04 PM Miquel SAILOR wrote: Pt is requesting to transfer FROM: Dr. Garald Pt is requesting to transfer TO: Dr. Charlanne Reason for requested transfer: [facility] needs call back [phone] It is the responsibility of the team the patient would like to transfer to (Dr. Charlanne) to reach out to the patient if for any reason this transfer is not acceptable.

## 2024-09-09 DIAGNOSIS — G301 Alzheimer's disease with late onset: Secondary | ICD-10-CM | POA: Diagnosis not present

## 2024-09-09 DIAGNOSIS — R2681 Unsteadiness on feet: Secondary | ICD-10-CM | POA: Diagnosis not present

## 2024-09-09 DIAGNOSIS — R41841 Cognitive communication deficit: Secondary | ICD-10-CM | POA: Diagnosis not present

## 2024-09-09 DIAGNOSIS — F02B0 Dementia in other diseases classified elsewhere, moderate, without behavioral disturbance, psychotic disturbance, mood disturbance, and anxiety: Secondary | ICD-10-CM | POA: Diagnosis not present

## 2024-09-09 DIAGNOSIS — M545 Low back pain, unspecified: Secondary | ICD-10-CM | POA: Diagnosis not present

## 2024-09-09 DIAGNOSIS — M810 Age-related osteoporosis without current pathological fracture: Secondary | ICD-10-CM | POA: Diagnosis not present

## 2024-09-10 DIAGNOSIS — R41841 Cognitive communication deficit: Secondary | ICD-10-CM | POA: Diagnosis not present

## 2024-09-10 DIAGNOSIS — R2681 Unsteadiness on feet: Secondary | ICD-10-CM | POA: Diagnosis not present

## 2024-09-10 DIAGNOSIS — G301 Alzheimer's disease with late onset: Secondary | ICD-10-CM | POA: Diagnosis not present

## 2024-09-13 ENCOUNTER — Encounter (HOSPITAL_BASED_OUTPATIENT_CLINIC_OR_DEPARTMENT_OTHER): Payer: Self-pay

## 2024-09-13 DIAGNOSIS — G301 Alzheimer's disease with late onset: Secondary | ICD-10-CM | POA: Diagnosis not present

## 2024-09-13 DIAGNOSIS — R2681 Unsteadiness on feet: Secondary | ICD-10-CM | POA: Diagnosis not present

## 2024-09-13 DIAGNOSIS — R41841 Cognitive communication deficit: Secondary | ICD-10-CM | POA: Diagnosis not present

## 2024-09-14 ENCOUNTER — Ambulatory Visit (INDEPENDENT_AMBULATORY_CARE_PROVIDER_SITE_OTHER): Admitting: Family

## 2024-09-14 ENCOUNTER — Encounter (HOSPITAL_BASED_OUTPATIENT_CLINIC_OR_DEPARTMENT_OTHER): Payer: Self-pay | Admitting: Family

## 2024-09-14 VITALS — BP 138/68 | HR 64 | Ht <= 58 in | Wt 114.9 lb

## 2024-09-14 DIAGNOSIS — R609 Edema, unspecified: Secondary | ICD-10-CM

## 2024-09-14 DIAGNOSIS — M810 Age-related osteoporosis without current pathological fracture: Secondary | ICD-10-CM | POA: Diagnosis not present

## 2024-09-14 DIAGNOSIS — E785 Hyperlipidemia, unspecified: Secondary | ICD-10-CM

## 2024-09-14 DIAGNOSIS — I25118 Atherosclerotic heart disease of native coronary artery with other forms of angina pectoris: Secondary | ICD-10-CM | POA: Diagnosis not present

## 2024-09-14 DIAGNOSIS — K5901 Slow transit constipation: Secondary | ICD-10-CM | POA: Diagnosis not present

## 2024-09-14 DIAGNOSIS — I10 Essential (primary) hypertension: Secondary | ICD-10-CM | POA: Diagnosis not present

## 2024-09-14 DIAGNOSIS — G301 Alzheimer's disease with late onset: Secondary | ICD-10-CM | POA: Diagnosis not present

## 2024-09-14 DIAGNOSIS — R2681 Unsteadiness on feet: Secondary | ICD-10-CM | POA: Diagnosis not present

## 2024-09-14 DIAGNOSIS — R41841 Cognitive communication deficit: Secondary | ICD-10-CM | POA: Diagnosis not present

## 2024-09-14 DIAGNOSIS — M545 Low back pain, unspecified: Secondary | ICD-10-CM | POA: Diagnosis not present

## 2024-09-14 DIAGNOSIS — F02B0 Dementia in other diseases classified elsewhere, moderate, without behavioral disturbance, psychotic disturbance, mood disturbance, and anxiety: Secondary | ICD-10-CM | POA: Diagnosis not present

## 2024-09-14 MED ORDER — FUROSEMIDE 20 MG PO TABS: ORAL_TABLET | ORAL | 1 refills | Status: DC

## 2024-09-14 MED ORDER — OLMESARTAN MEDOXOMIL 20 MG PO TABS: ORAL_TABLET | ORAL | 3 refills | Status: AC

## 2024-09-14 MED ORDER — AMLODIPINE BESYLATE 2.5 MG PO TABS: ORAL_TABLET | ORAL | 11 refills | Status: AC

## 2024-09-14 NOTE — Patient Instructions (Addendum)
 Medication Instructions:  See Medication Schedule as below    Follow-Up: Please follow up in 3 weeks with Dr. Lonni or Reche Finder, NP   Special Instructions:    MORNING BLOOD PRESSURE  LESS THAN <110  = NO medication  BETWEEN 110-160 = HALF (10mg ) Olmesartan   GREATER THAN >160  = 1 (20mg ) Olmesartan   EVENING BLOOD PRESSURE  LESS THAN <110  = NO medication  BETWEEN 110-160 = 1 (20mg ) Olmesartan   GREATER THAN >160  = 1 (20mg ) Olmesartan     1 (2.5mg ) Amlodipine 

## 2024-09-14 NOTE — Progress Notes (Signed)
 Cardiology Office Note:  .   Date:  09/14/2024  ID:  [name], DOB 1941-07-21, MRN [id number] PCP: Teresa Fredia CROME, MD  [facility] Providers Cardiologist:  Teresa Bruckner, MD    History of Present Illness: [name] is a 83 y.o. female  hx of HTN, CAD (nonobstructive by cath 2018), HLD, PAF during hospitalization 2017   Prior patient of Dr. Mady.  Established with Dr. Bruckner 07/19/2019.  Longstanding history of labile blood pressure and orthostatic hypotension. Losartan  previously transitioned to Olmesartan  20mg . She has had persistent GI upset after her first meal despite adjustment in medication timings and no symptoms after later meals. 24h BP cuff 06/03/23 with average BP 152/59 (daytime 155/71 and nighttime 137/60) with range 73/43-213/95. At visit 09/26/23 with Dr. Bruckner Amlodipine  2.5mg  was added.   ED visit 10/09/23 with mechanical fall after her Latera Mclin got caught in the door.  Left elbow had olecranon fracture.  On 11/10/2023 underwent ORIF of left elbow/olecranon.  She has longstanding history of labile hypertension. Last seen 08/23/2024 by Dr. Maris after contacting the office noting elevated blood pressure readings in the 190-200s however also with hypotensive readings.Orthostatic precautions were reviewed.  Hold parameters for olmesartan  initiated, advised to only hold evening dose if SBP less than 110.  If BP in the a.m. greater than 160 advised to take olmesartan  20 mg daily.  There was thought for use of as needed hydralazine  however concern was it would aggravate hypotension.  Presents today for follow-up. Reports feeling rotten in the morning. She reports feeling sick after her medications. Reports feeling useless until after 1pm. This has been ongoing and not improved since titration of antihypertensive therapy.  She is not having hypotensive reading in the morning prior to the medication. Reports BP will drop to 100 after her  medications. Reports feeling no difference when taking medications with or without food. Feels her vision is impacted by her blood pressure, encouraged to schedule overdue follow up with her eye doctor. Reports home BP cuff checked yesterday and was a little bit off. Notes she does not sit and rest prior to her BP medications. She notes her BP today is low in clinic at 138/68 and discussed this would be considered a good to mildly elevated BP reading.   Her BP log predominantly reflects readings prior to morning medications with BP range 120-160. Rare SBP >160. She will check BP after morning medications about 4 times per month with those readings SBP 90-110s. She endorses checking her BP in the evening, but does not have recorded on her log.   ROS: Please see the history of present illness.    All other systems reviewed and are negative.   Studies Reviewed: .         Cardiac Studies & Procedures   ______________________________________________________________________________________________ CARDIAC CATHETERIZATION  CARDIAC CATHETERIZATION 04/07/2017  Conclusion Conclusions: 1. Minimal coronary artery disease involving the large epicardial coronary arteries. Mild to moderate stenosis of diagonal branches is evident. 2. Normal left ventricular contraction. 3. Normal left ventricular systolic function. 4. Small right radial artery.  Recommendations: 1. Continue medical therapy and risk factor modification.  Stein Mady, MD [facility] Pager: [phone]  Findings Coronary Findings Diagnostic  Dominance: Right  Left Main Vessel is large. Vessel is angiographically normal.  Left Anterior Descending Vessel is large. The vessel exhibits minimal luminal irregularities.  First Diagonal Branch Vessel is moderate in size.  Second Diagonal Branch Vessel is small in size.  Third Diagonal Branch Vessel is small in size.  Left Circumflex Vessel is large. Vessel is  angiographically normal.  First Obtuse Marginal Branch Vessel is small in size.  Second Obtuse Marginal Branch Vessel is moderate in size.  Third Obtuse Marginal Branch Vessel is moderate in size.  Right Coronary Artery Vessel is large. Vessel is angiographically normal.  Right Posterior Descending Artery Vessel is moderate in size.  Right Posterior Atrioventricular Artery Vessel is moderate in size.  Intervention  No interventions have been documented.   STRESS TESTS  MYOCARDIAL PERFUSION IMAGING 03/24/2017  Interpretation Summary  Nuclear stress EF: 67%.  There was no ST segment deviation noted during stress.  There is a small defect of mild severity present in the basal inferoseptal and mid inferoseptal location. The defect is partially reversible and could represent variations in diaphragmatic attenuation vs. ischemia.  There is a small defect of moderate severity present in the apex location. The defect is reversible and partially reversible.  There is a medium defect of moderate severity present in the basal anteroseptal, mid anteroseptal and apical septal location. The defect is reversible and worrisome for ischemia.  The left ventricular ejection fraction is hyperdynamic (>65%).  This is a high risk study.   ECHOCARDIOGRAM  ECHOCARDIOGRAM COMPLETE 04/17/2021  Narrative ECHOCARDIOGRAM REPORT    Patient Name:   [name] Date of Exam: 04/17/2021 Medical Rec #:  [id number]      Height:       59.0 in Accession #:    [id number]     Weight:       111.0 lb Date of Birth:  07-10-1941      BSA:          1.436 m Patient Age:    80 years       BP:           144/86 mmHg Patient Gender: F              HR:           62 bpm. Exam Location:  [address]  Procedure: 2D Echo, Cardiac Doppler and Color Doppler  Indications:    I10 Hypertension  History:        Patient has prior history of Echocardiogram examinations, most recent 01/22/2016. Risk  Factors:Hypertension and Dyslipidemia. Chronic kidney disease. Paroxysmal atrial fibrillation. Fatigue. Scoliosis. Murmur. Chest pain.  Sonographer:    [name] RDCS Referring Phys: [id number] [name]  IMPRESSIONS   1. Left ventricular ejection fraction, by estimation, is 60 to 65%. The left ventricle has normal function. The left ventricle has no regional wall motion abnormalities. There is severe asymmetric left ventricular hypertrophy of the basal-septal segment. Left ventricular diastolic parameters are consistent with Grade I diastolic dysfunction (impaired relaxation). 2. Right ventricular systolic function is normal. The right ventricular size is normal. There is normal pulmonary artery systolic pressure. The estimated right ventricular systolic pressure is 32.4 mmHg. 3. Left atrial size was mildly dilated. 4. The mitral valve is abnormal. Trivial mitral valve regurgitation. 5. The aortic valve is tricuspid. Aortic valve regurgitation is mild. Mild to moderate aortic valve sclerosis/calcification is present, without any evidence of aortic stenosis. Aortic regurgitation PHT measures 517 msec. 6. The inferior vena cava is dilated in size with >50% respiratory variability, suggesting right atrial pressure of 8 mmHg.  Comparison(s): Changes from prior study are noted. 01/22/2016: LVEF 55-60%, mild LAE, RVSP 49 mmHg.  FINDINGS Left Ventricle: Left ventricular ejection fraction, by estimation, is 60  to 65%. The left ventricle has normal function. The left ventricle has no regional wall motion abnormalities. The left ventricular internal cavity size was normal in size. There is severe asymmetric left ventricular hypertrophy of the basal-septal segment. Left ventricular diastolic parameters are consistent with Grade I diastolic dysfunction (impaired relaxation). Indeterminate filling pressures.  Right Ventricle: The right ventricular size is normal. No increase in right  ventricular wall thickness. Right ventricular systolic function is normal. There is normal pulmonary artery systolic pressure. The tricuspid regurgitant velocity is 2.47 m/s, and with an assumed right atrial pressure of 8 mmHg, the estimated right ventricular systolic pressure is 32.4 mmHg.  Left Atrium: Left atrial size was mildly dilated.  Right Atrium: Right atrial size was normal in size.  Pericardium: There is no evidence of pericardial effusion.  Mitral Valve: The mitral valve is abnormal. There is mild thickening of the mitral valve leaflet(s). Trivial mitral valve regurgitation.  Tricuspid Valve: The tricuspid valve is grossly normal. Tricuspid valve regurgitation is mild.  Aortic Valve: The aortic valve is tricuspid. Aortic valve regurgitation is mild. Aortic regurgitation PHT measures 517 msec. Mild to moderate aortic valve sclerosis/calcification is present, without any evidence of aortic stenosis.  Pulmonic Valve: The pulmonic valve was grossly normal. Pulmonic valve regurgitation is trivial.  Aorta: The aortic root and ascending aorta are structurally normal, with no evidence of dilitation.  Venous: The inferior vena cava is dilated in size with greater than 50% respiratory variability, suggesting right atrial pressure of 8 mmHg.  IAS/Shunts: No atrial level shunt detected by color flow Doppler.   LEFT VENTRICLE PLAX 2D LVIDd:         2.90 cm  Diastology LVIDs:         1.90 cm  LV e' medial:    4.57 cm/s LV PW:         1.30 cm  LV E/e' medial:  14.8 LV IVS:        1.60 cm  LV e' lateral:   6.74 cm/s LVOT diam:     2.10 cm  LV E/e' lateral: 10.0 LV SV:         78 LV SV Index:   55 LVOT Area:     3.46 cm   RIGHT VENTRICLE             IVC RV Basal diam:  3.60 cm     IVC diam: 2.10 cm RV S prime:     16.00 cm/s TAPSE (M-mode): 2.0 cm  LEFT ATRIUM             Index       RIGHT ATRIUM           Index LA diam:        3.70 cm 2.58 cm/m  RA Area:     11.40 cm LA Vol  (A2C):   38.4 ml 26.74 ml/m RA Volume:   27.30 ml  19.01 ml/m LA Vol (A4C):   31.1 ml 21.66 ml/m LA Biplane Vol: 36.3 ml 25.28 ml/m AORTIC VALVE LVOT Vmax:   108.50 cm/s LVOT Vmean:  66.400 cm/s LVOT VTI:    0.226 m AI PHT:      517 msec  AORTA Ao Root diam: 3.50 cm Ao Asc diam:  3.90 cm  MITRAL VALVE               TRICUSPID VALVE MV Area (PHT): 3.03 cm    TR Peak grad:   24.4 mmHg MV Decel Time: 250  msec    TR Vmax:        247.00 cm/s MV E velocity: 67.70 cm/s MV A velocity: 67.70 cm/s  SHUNTS MV E/A ratio:  1.00        Systemic VTI:  0.23 m Systemic Diam: 2.10 cm  Vinie Maxcy MD Electronically signed by Vinie Maxcy MD Signature Date/Time: 04/17/2021/1:55:32 PM    Final    MONITORS  CARDIAC EVENT MONITOR 01/23/2016  Narrative NSR with PACs.  No atrial fibrillation.       ______________________________________________________________________________________________      Risk Assessment/Calculations:             Physical Exam:   VS:  BP 134/62   Pulse 64   Ht 4' 10 (1.473 m)   Wt 114 lb 14.4 oz (52.1 kg)   SpO2 94%   BMI 24.01 kg/m    Wt Readings from Last 3 Encounters:  09/14/24 114 lb 14.4 oz (52.1 kg)  08/24/24 118 lb 3.2 oz (53.6 kg)  08/23/24 116 lb 12.8 oz (53 kg)    GEN: thin, well developed in no acute distress. Ambulates with rolling Brianca Fortenberry NECK: No JVD; No carotid bruits CARDIAC: RRR, no murmurs, rubs, gallops RESPIRATORY:  Clear to auscultation without rales, wheezing or rhonchi  ABDOMEN: Soft, non-tender, non-distended EXTREMITIES:  Nonpitting pretibial edema; No deformity   ASSESSMENT AND PLAN: .    Labile hypertension - Longstanding issue. BP overall controlled by recent clinic readings, remains labile at home. Reasonable BP goal <140/90 given her labile BP and symptoms with relative hypotension. Previously significant elevations with anxiety. Reports feeling significantly poorly after morning medications, does not routinely  check BP after medications.  As feeling poorly after medications, move Amlodipine  2.5mg  to evening dosing. Only to be taken if SBP >160 in the evening.  Adjust Olmesartan :  Morning: SBP <110, no medication. SBP 110-160 HALF Olmesartan  (10mg ). SBP >160 WHOLE Olmesartan  (20mg ) Evening: SBP <110, no medication. SBP 110-160 WHOLE Olmesartan  20mg . SBP >160 WHOLE Olmesartan  20mg  AND WHOLE Amlodipine  2.5mg  daily. Hesitant to use Hydralazine  due to labile BP with concern for causing hypotension Future considerations: adjust Olmesartan  to 40mg  at bedtime if persistently bothered by morning dosing. Alternatively, consider Candesartan as alternate ARB.  Reports home BP cuff checked yesterday at [facility] and was told it is a little off but not sure if it runs high or low. Asked to check BP prior and post medications and bring log to follow up   Trace bilateral pretibial edema - improved with compression stockings. Leg elevation recommended. Has PRN Lasix , not presently needing.   Anxiety - Managed by her primary care team.       Dispo: follow up in 3 weeks  Signed, Reche GORMAN Finder, NP

## 2024-09-15 DIAGNOSIS — M810 Age-related osteoporosis without current pathological fracture: Secondary | ICD-10-CM | POA: Diagnosis not present

## 2024-09-15 DIAGNOSIS — M545 Low back pain, unspecified: Secondary | ICD-10-CM | POA: Diagnosis not present

## 2024-09-15 DIAGNOSIS — G301 Alzheimer's disease with late onset: Secondary | ICD-10-CM | POA: Diagnosis not present

## 2024-09-15 DIAGNOSIS — R41841 Cognitive communication deficit: Secondary | ICD-10-CM | POA: Diagnosis not present

## 2024-09-15 DIAGNOSIS — R2681 Unsteadiness on feet: Secondary | ICD-10-CM | POA: Diagnosis not present

## 2024-09-15 DIAGNOSIS — F02B0 Dementia in other diseases classified elsewhere, moderate, without behavioral disturbance, psychotic disturbance, mood disturbance, and anxiety: Secondary | ICD-10-CM | POA: Diagnosis not present

## 2024-09-16 DIAGNOSIS — R41841 Cognitive communication deficit: Secondary | ICD-10-CM | POA: Diagnosis not present

## 2024-09-16 DIAGNOSIS — R2681 Unsteadiness on feet: Secondary | ICD-10-CM | POA: Diagnosis not present

## 2024-09-16 DIAGNOSIS — M810 Age-related osteoporosis without current pathological fracture: Secondary | ICD-10-CM | POA: Diagnosis not present

## 2024-09-16 DIAGNOSIS — G301 Alzheimer's disease with late onset: Secondary | ICD-10-CM | POA: Diagnosis not present

## 2024-09-16 DIAGNOSIS — F02B0 Dementia in other diseases classified elsewhere, moderate, without behavioral disturbance, psychotic disturbance, mood disturbance, and anxiety: Secondary | ICD-10-CM | POA: Diagnosis not present

## 2024-09-16 DIAGNOSIS — M545 Low back pain, unspecified: Secondary | ICD-10-CM | POA: Diagnosis not present

## 2024-09-20 DIAGNOSIS — G301 Alzheimer's disease with late onset: Secondary | ICD-10-CM | POA: Diagnosis not present

## 2024-09-20 DIAGNOSIS — R41841 Cognitive communication deficit: Secondary | ICD-10-CM | POA: Diagnosis not present

## 2024-09-20 DIAGNOSIS — R2681 Unsteadiness on feet: Secondary | ICD-10-CM | POA: Diagnosis not present

## 2024-09-21 ENCOUNTER — Ambulatory Visit: Admitting: Internal Medicine

## 2024-09-21 DIAGNOSIS — M4854XA Collapsed vertebra, not elsewhere classified, thoracic region, initial encounter for fracture: Secondary | ICD-10-CM | POA: Diagnosis not present

## 2024-09-21 DIAGNOSIS — M545 Low back pain, unspecified: Secondary | ICD-10-CM | POA: Diagnosis not present

## 2024-09-21 DIAGNOSIS — R2681 Unsteadiness on feet: Secondary | ICD-10-CM | POA: Diagnosis not present

## 2024-09-21 DIAGNOSIS — M810 Age-related osteoporosis without current pathological fracture: Secondary | ICD-10-CM | POA: Diagnosis not present

## 2024-09-22 ENCOUNTER — Telehealth: Payer: Self-pay

## 2024-09-22 NOTE — Telephone Encounter (Signed)
 D/W [name] Patient will take Extra Amlodipine  as her SBP is more then 180

## 2024-09-22 NOTE — Telephone Encounter (Signed)
 Copied from CRM [id number]. Topic: Clinical - Medical Advice >> Sep 22, 2024 11:30 AM Diannia DEL wrote: Reason for CRM: Patients nurse Autumn called form [facility] and wanted the provider to know her blood pressure was 176/88 when they checked it last. Autumn is requesting a callback [phone]

## 2024-09-23 DIAGNOSIS — M810 Age-related osteoporosis without current pathological fracture: Secondary | ICD-10-CM | POA: Diagnosis not present

## 2024-09-23 DIAGNOSIS — R2681 Unsteadiness on feet: Secondary | ICD-10-CM | POA: Diagnosis not present

## 2024-09-23 DIAGNOSIS — M4854XA Collapsed vertebra, not elsewhere classified, thoracic region, initial encounter for fracture: Secondary | ICD-10-CM | POA: Diagnosis not present

## 2024-09-23 DIAGNOSIS — M545 Low back pain, unspecified: Secondary | ICD-10-CM | POA: Diagnosis not present

## 2024-09-24 DIAGNOSIS — G301 Alzheimer's disease with late onset: Secondary | ICD-10-CM | POA: Diagnosis not present

## 2024-09-24 DIAGNOSIS — R41841 Cognitive communication deficit: Secondary | ICD-10-CM | POA: Diagnosis not present

## 2024-09-24 DIAGNOSIS — R2681 Unsteadiness on feet: Secondary | ICD-10-CM | POA: Diagnosis not present

## 2024-09-27 ENCOUNTER — Ambulatory Visit: Admitting: Physical Therapy

## 2024-09-27 DIAGNOSIS — R41841 Cognitive communication deficit: Secondary | ICD-10-CM | POA: Diagnosis not present

## 2024-09-27 DIAGNOSIS — R2681 Unsteadiness on feet: Secondary | ICD-10-CM | POA: Diagnosis not present

## 2024-09-27 DIAGNOSIS — G301 Alzheimer's disease with late onset: Secondary | ICD-10-CM | POA: Diagnosis not present

## 2024-09-28 DIAGNOSIS — M4854XA Collapsed vertebra, not elsewhere classified, thoracic region, initial encounter for fracture: Secondary | ICD-10-CM | POA: Diagnosis not present

## 2024-09-28 DIAGNOSIS — D1801 Hemangioma of skin and subcutaneous tissue: Secondary | ICD-10-CM | POA: Diagnosis not present

## 2024-09-28 DIAGNOSIS — R2681 Unsteadiness on feet: Secondary | ICD-10-CM | POA: Diagnosis not present

## 2024-09-28 DIAGNOSIS — C4491 Basal cell carcinoma of skin, unspecified: Secondary | ICD-10-CM | POA: Diagnosis not present

## 2024-09-28 DIAGNOSIS — D229 Melanocytic nevi, unspecified: Secondary | ICD-10-CM | POA: Diagnosis not present

## 2024-09-28 DIAGNOSIS — Z85828 Personal history of other malignant neoplasm of skin: Secondary | ICD-10-CM | POA: Diagnosis not present

## 2024-09-28 DIAGNOSIS — M810 Age-related osteoporosis without current pathological fracture: Secondary | ICD-10-CM | POA: Diagnosis not present

## 2024-09-28 DIAGNOSIS — L659 Nonscarring hair loss, unspecified: Secondary | ICD-10-CM | POA: Diagnosis not present

## 2024-09-28 DIAGNOSIS — L821 Other seborrheic keratosis: Secondary | ICD-10-CM | POA: Diagnosis not present

## 2024-09-28 DIAGNOSIS — L578 Other skin changes due to chronic exposure to nonionizing radiation: Secondary | ICD-10-CM | POA: Diagnosis not present

## 2024-09-28 DIAGNOSIS — L814 Other melanin hyperpigmentation: Secondary | ICD-10-CM | POA: Diagnosis not present

## 2024-09-28 DIAGNOSIS — M545 Low back pain, unspecified: Secondary | ICD-10-CM | POA: Diagnosis not present

## 2024-09-29 ENCOUNTER — Ambulatory Visit: Admitting: Internal Medicine

## 2024-09-30 DIAGNOSIS — M545 Low back pain, unspecified: Secondary | ICD-10-CM | POA: Diagnosis not present

## 2024-09-30 DIAGNOSIS — R2681 Unsteadiness on feet: Secondary | ICD-10-CM | POA: Diagnosis not present

## 2024-09-30 DIAGNOSIS — M810 Age-related osteoporosis without current pathological fracture: Secondary | ICD-10-CM | POA: Diagnosis not present

## 2024-09-30 DIAGNOSIS — M4854XA Collapsed vertebra, not elsewhere classified, thoracic region, initial encounter for fracture: Secondary | ICD-10-CM | POA: Diagnosis not present

## 2024-10-01 DIAGNOSIS — R2681 Unsteadiness on feet: Secondary | ICD-10-CM | POA: Diagnosis not present

## 2024-10-01 DIAGNOSIS — R41841 Cognitive communication deficit: Secondary | ICD-10-CM | POA: Diagnosis not present

## 2024-10-01 DIAGNOSIS — G301 Alzheimer's disease with late onset: Secondary | ICD-10-CM | POA: Diagnosis not present

## 2024-10-04 ENCOUNTER — Ambulatory Visit (INDEPENDENT_AMBULATORY_CARE_PROVIDER_SITE_OTHER): Admitting: Family

## 2024-10-04 ENCOUNTER — Encounter (HOSPITAL_BASED_OUTPATIENT_CLINIC_OR_DEPARTMENT_OTHER): Payer: Self-pay | Admitting: Family

## 2024-10-04 VITALS — BP 124/60 | HR 62 | Ht 60.0 in | Wt 116.8 lb

## 2024-10-04 DIAGNOSIS — G301 Alzheimer's disease with late onset: Secondary | ICD-10-CM | POA: Diagnosis not present

## 2024-10-04 DIAGNOSIS — F411 Generalized anxiety disorder: Secondary | ICD-10-CM | POA: Diagnosis not present

## 2024-10-04 DIAGNOSIS — R2681 Unsteadiness on feet: Secondary | ICD-10-CM | POA: Diagnosis not present

## 2024-10-04 DIAGNOSIS — R41841 Cognitive communication deficit: Secondary | ICD-10-CM | POA: Diagnosis not present

## 2024-10-04 DIAGNOSIS — R0989 Other specified symptoms and signs involving the circulatory and respiratory systems: Secondary | ICD-10-CM | POA: Diagnosis not present

## 2024-10-04 NOTE — Progress Notes (Signed)
 Cardiology Office Note:  .   Date:  10/04/2024  ID:  CIERRIA HEIGHT, DOB 1940-12-17, MRN [id number] PCP: Charlanne Fredia CROME, MD  [facility] Providers Cardiologist:  Shelda Bruckner, MD    History of Present Illness: Teresa   [name] is a 83 y.o. female  hx of HTN, CAD (nonobstructive by cath 2018), HLD, PAF during hospitalization 2017   Prior patient of Dr. Mady.  Established with Dr. Bruckner 07/19/2019.  Longstanding history of labile blood pressure and orthostatic hypotension. Losartan  previously transitioned to Olmesartan  20mg . She has had persistent GI upset after her first meal despite adjustment in medication timings and no symptoms after later meals. 24h BP cuff 06/03/23 with average BP 152/59 (daytime 155/71 and nighttime 137/60) with range 73/43-213/95. At visit 09/26/23 with Dr. Bruckner Amlodipine  2.5mg  was added.   ED visit 10/09/23 with mechanical fall after her Alyzae Hawkey got caught in the door.  Left elbow had olecranon fracture.  On 11/10/2023 underwent ORIF of left elbow/olecranon.  She has longstanding history of labile hypertension. Last seen 08/23/2024 by Dr. Maris after contacting the office noting elevated blood pressure readings in the 190-200s however also with hypotensive readings.Orthostatic precautions were reviewed.  Hold parameters for olmesartan  initiated, advised to only hold evening dose if SBP less than 110.  If BP in the a.m. greater than 160 advised to take olmesartan  20 mg daily.  There was thought for use of as needed hydralazine  however concern was it would aggravate hypotension.  She was seen 09/14/24 with BP controlled in clinic, labile at home. BP goal <140/90 recommended. She continued to feel poorly after morning medications. Amlodipine  was moved to evening dosing to only be taken if evening BP >160. Olmesartan  adjusted to be based on BP readings. In AM if SBP <110, hold Olmesartan  10mg  and if 110-160 Olmesartan  10mg  and if >160 Olmesartan   20mg .   Presents today for follow-up. She brings a log of home BP readings, checked twice in AM before meds and once in PM. She feels sick and droopy when systolic is less than 130, as it is today. She feels BP is best when she takes lorazepam . She has not needed PRN furosemide  for edema. She denies chest pain, shortness of breath, palpitations, edema. BP log indicates lightheadedness, feeling shakey that correlates with higher SBP readings. Of note, she is often taking a half tablet of Olmesartan  in the evening despite recommendation for whole tablet leading  to morning BP over the last week 145/98-201/108 and evening readings ranging 109/79-158/98.    ROS: Please see the history of present illness.    All other systems reviewed and are negative.   Studies Reviewed: .         Cardiac Studies & Procedures   ______________________________________________________________________________________________ CARDIAC CATHETERIZATION  CARDIAC CATHETERIZATION 04/07/2017  Conclusion Conclusions: 1. Minimal coronary artery disease involving the large epicardial coronary arteries. Mild to moderate stenosis of diagonal branches is evident. 2. Normal left ventricular contraction. 3. Normal left ventricular systolic function. 4. Small right radial artery.  Recommendations: 1. Continue medical therapy and risk factor modification.  Bruckner Mady, MD [facility] Pager: [phone]  Findings Coronary Findings Diagnostic  Dominance: Right  Left Main Vessel is large. Vessel is angiographically normal.  Left Anterior Descending Vessel is large. The vessel exhibits minimal luminal irregularities.  First Diagonal Branch Vessel is moderate in size.  Second Diagonal Branch Vessel is small in size.  Third Diagonal Branch Vessel is small in size.  Left Circumflex  Vessel is large. Vessel is angiographically normal.  First Obtuse Marginal Branch Vessel is small in size.  Second  Obtuse Marginal Branch Vessel is moderate in size.  Third Obtuse Marginal Branch Vessel is moderate in size.  Right Coronary Artery Vessel is large. Vessel is angiographically normal.  Right Posterior Descending Artery Vessel is moderate in size.  Right Posterior Atrioventricular Artery Vessel is moderate in size.  Intervention  No interventions have been documented.   STRESS TESTS  MYOCARDIAL PERFUSION IMAGING 03/24/2017  Interpretation Summary  Nuclear stress EF: 67%.  There was no ST segment deviation noted during stress.  There is a small defect of mild severity present in the basal inferoseptal and mid inferoseptal location. The defect is partially reversible and could represent variations in diaphragmatic attenuation vs. ischemia.  There is a small defect of moderate severity present in the apex location. The defect is reversible and partially reversible.  There is a medium defect of moderate severity present in the basal anteroseptal, mid anteroseptal and apical septal location. The defect is reversible and worrisome for ischemia.  The left ventricular ejection fraction is hyperdynamic (>65%).  This is a high risk study.   ECHOCARDIOGRAM  ECHOCARDIOGRAM COMPLETE 04/17/2021  Narrative ECHOCARDIOGRAM REPORT    Patient Name:   [name] Date of Exam: 04/17/2021 Medical Rec #:  [id number]      Height:       59.0 in Accession #:    [id number]     Weight:       111.0 lb Date of Birth:  01-Nov-1941      BSA:          1.436 m Patient Age:    80 years       BP:           144/86 mmHg Patient Gender: F              HR:           62 bpm. Exam Location:  [address]  Procedure: 2D Echo, Cardiac Doppler and Color Doppler  Indications:    I10 Hypertension  History:        Patient has prior history of Echocardiogram examinations, most recent 01/22/2016. Risk Factors:Hypertension and Dyslipidemia. Chronic kidney disease. Paroxysmal atrial fibrillation.  Fatigue. Scoliosis. Murmur. Chest pain.  Sonographer:    Carl Coma RDCS Referring Phys: [id number] BRIDGETTE CHRISTOPHER  IMPRESSIONS   1. Left ventricular ejection fraction, by estimation, is 60 to 65%. The left ventricle has normal function. The left ventricle has no regional wall motion abnormalities. There is severe asymmetric left ventricular hypertrophy of the basal-septal segment. Left ventricular diastolic parameters are consistent with Grade I diastolic dysfunction (impaired relaxation). 2. Right ventricular systolic function is normal. The right ventricular size is normal. There is normal pulmonary artery systolic pressure. The estimated right ventricular systolic pressure is 32.4 mmHg. 3. Left atrial size was mildly dilated. 4. The mitral valve is abnormal. Trivial mitral valve regurgitation. 5. The aortic valve is tricuspid. Aortic valve regurgitation is mild. Mild to moderate aortic valve sclerosis/calcification is present, without any evidence of aortic stenosis. Aortic regurgitation PHT measures 517 msec. 6. The inferior vena cava is dilated in size with >50% respiratory variability, suggesting right atrial pressure of 8 mmHg.  Comparison(s): Changes from prior study are noted. 01/22/2016: LVEF 55-60%, mild LAE, RVSP 49 mmHg.  FINDINGS Left Ventricle: Left ventricular ejection fraction, by estimation, is 60 to 65%. The left ventricle has normal function. The left ventricle  has no regional wall motion abnormalities. The left ventricular internal cavity size was normal in size. There is severe asymmetric left ventricular hypertrophy of the basal-septal segment. Left ventricular diastolic parameters are consistent with Grade I diastolic dysfunction (impaired relaxation). Indeterminate filling pressures.  Right Ventricle: The right ventricular size is normal. No increase in right ventricular wall thickness. Right ventricular systolic function is normal. There is normal  pulmonary artery systolic pressure. The tricuspid regurgitant velocity is 2.47 m/s, and with an assumed right atrial pressure of 8 mmHg, the estimated right ventricular systolic pressure is 32.4 mmHg.  Left Atrium: Left atrial size was mildly dilated.  Right Atrium: Right atrial size was normal in size.  Pericardium: There is no evidence of pericardial effusion.  Mitral Valve: The mitral valve is abnormal. There is mild thickening of the mitral valve leaflet(s). Trivial mitral valve regurgitation.  Tricuspid Valve: The tricuspid valve is grossly normal. Tricuspid valve regurgitation is mild.  Aortic Valve: The aortic valve is tricuspid. Aortic valve regurgitation is mild. Aortic regurgitation PHT measures 517 msec. Mild to moderate aortic valve sclerosis/calcification is present, without any evidence of aortic stenosis.  Pulmonic Valve: The pulmonic valve was grossly normal. Pulmonic valve regurgitation is trivial.  Aorta: The aortic root and ascending aorta are structurally normal, with no evidence of dilitation.  Venous: The inferior vena cava is dilated in size with greater than 50% respiratory variability, suggesting right atrial pressure of 8 mmHg.  IAS/Shunts: No atrial level shunt detected by color flow Doppler.   LEFT VENTRICLE PLAX 2D LVIDd:         2.90 cm  Diastology LVIDs:         1.90 cm  LV e' medial:    4.57 cm/s LV PW:         1.30 cm  LV E/e' medial:  14.8 LV IVS:        1.60 cm  LV e' lateral:   6.74 cm/s LVOT diam:     2.10 cm  LV E/e' lateral: 10.0 LV SV:         78 LV SV Index:   55 LVOT Area:     3.46 cm   RIGHT VENTRICLE             IVC RV Basal diam:  3.60 cm     IVC diam: 2.10 cm RV S prime:     16.00 cm/s TAPSE (M-mode): 2.0 cm  LEFT ATRIUM             Index       RIGHT ATRIUM           Index LA diam:        3.70 cm 2.58 cm/m  RA Area:     11.40 cm LA Vol (A2C):   38.4 ml 26.74 ml/m RA Volume:   27.30 ml  19.01 ml/m LA Vol (A4C):   31.1 ml  21.66 ml/m LA Biplane Vol: 36.3 ml 25.28 ml/m AORTIC VALVE LVOT Vmax:   108.50 cm/s LVOT Vmean:  66.400 cm/s LVOT VTI:    0.226 m AI PHT:      517 msec  AORTA Ao Root diam: 3.50 cm Ao Asc diam:  3.90 cm  MITRAL VALVE               TRICUSPID VALVE MV Area (PHT): 3.03 cm    TR Peak grad:   24.4 mmHg MV Decel Time: 250 msec    TR Vmax:  247.00 cm/s MV E velocity: 67.70 cm/s MV A velocity: 67.70 cm/s  SHUNTS MV E/A ratio:  1.00        Systemic VTI:  0.23 m Systemic Diam: 2.10 cm  Vinie Maxcy MD Electronically signed by Vinie Maxcy MD Signature Date/Time: 04/17/2021/1:55:32 PM    Final    MONITORS  CARDIAC EVENT MONITOR 01/23/2016  Narrative NSR with PACs.  No atrial fibrillation.       ______________________________________________________________________________________________        Risk Assessment/Calculations:             Physical Exam:   VS:  BP 124/60 (BP Location: Left Arm, Patient Position: Sitting, Cuff Size: Normal)   Pulse 62   Ht 5' (1.524 m)   Wt 53 kg   SpO2 95%   BMI 22.81 kg/m    Wt Readings from Last 3 Encounters:  10/04/24 53 kg  09/14/24 52.1 kg  08/24/24 53.6 kg    GEN: Thin, well developed, in no acute distress. Ambulates with rolling Cordelia Bessinger NECK: No JVD; No carotid bruits CARDIAC: RRR, no murmurs, rubs, gallops, radial and DP pulses 2+ bilaterally RESPIRATORY:  Clear to auscultation without rales, wheezing or rhonchi  ABDOMEN: Soft, non-tender, non-distended EXTREMITIES:  No edema; No deformity   ASSESSMENT AND PLAN: .    Labile hypertension - Longstanding issue. Reasonable BP goal <140/90 given her labile BP and symptoms with relative hypotension. Previously significant elevations with anxiety. Advised to discuss daily anxiolytic with PCP. Per review of BP log, several days noted where PM olmesartan  had not been taken when SBP >110. Several days where amlodipine  was taken in AM. If SBP >180, take normal meds as  prescribed and check 1 hour after meds. If still >180, contact nurse at [facility]. If BP <110/60, hydrate and eat something to increase BP Recommended medication regimen: (she was given a print out of this)  MORNING BLOOD PRESSURE LESS THAN <110 = NO medication BETWEEN 110-160= HALF (10mg ) Olmesartan  GREATER THAN >160  = 1 (20mg ) Olmesartan   EVENING BLOOD PRESSURE LESS THAN <110  = NO medication BETWEEN 110-160 =   1 (20mg ) Olmesartan  GREATER THAN >160 = 1 (20mg ) Olmesartan  AND 1 (2.5mg ) Amlodipine   Trace bilateral pretibial edema - improved/resolved with compression stockings. Leg elevation recommended. Has PRN Lasix , not presently needing.   Anxiety - Managed by her primary care team. Has prescription for lorazepm BID PRN. Takes at least once daily and feels BP responds better to this than antihypertensive regimen. Advised to discuss daily medication such as SSRI with PCP.       Dispo: follow up in 2-3 months  Signed, Reche Finder, NP

## 2024-10-04 NOTE — Patient Instructions (Addendum)
 Medication Instructions:   Please see attached medication regimen.  You should only be taking Amlodipine  in the evening, not in the morning.  In the evening, you should take a whole tablet of Olmesartan  unless your systolic blood pressure (top number) is less than 110.  If your blood pressure more than 180 take your normally scheduled medications. If one hour after your medications your blood pressure is still more than 180, talk to the nurse at your facility.  *If you need a refill on your cardiac medications before your next appointment, please call your pharmacy*  Follow-Up: Your next appointment:   2-3 month(s)  Provider:   Shelda Bruckner, MD or Reche Finder, NP    Other Instructions  Our goal is for your blood pressure to be less than 140/90.   If your blood pressure less than 110/60, recommend you eat and drink something to help increase your blood pressure.   Recommend talking with Dr. Charlanne at your upcoming visit about possible longer acting medication for anxiety/stress as well as refills of your Lorazepam .

## 2024-10-05 DIAGNOSIS — M545 Low back pain, unspecified: Secondary | ICD-10-CM | POA: Diagnosis not present

## 2024-10-05 DIAGNOSIS — M4854XA Collapsed vertebra, not elsewhere classified, thoracic region, initial encounter for fracture: Secondary | ICD-10-CM | POA: Diagnosis not present

## 2024-10-05 DIAGNOSIS — M810 Age-related osteoporosis without current pathological fracture: Secondary | ICD-10-CM | POA: Diagnosis not present

## 2024-10-05 DIAGNOSIS — R2681 Unsteadiness on feet: Secondary | ICD-10-CM | POA: Diagnosis not present

## 2024-10-07 DIAGNOSIS — R2681 Unsteadiness on feet: Secondary | ICD-10-CM | POA: Diagnosis not present

## 2024-10-07 DIAGNOSIS — M810 Age-related osteoporosis without current pathological fracture: Secondary | ICD-10-CM | POA: Diagnosis not present

## 2024-10-07 DIAGNOSIS — M4854XA Collapsed vertebra, not elsewhere classified, thoracic region, initial encounter for fracture: Secondary | ICD-10-CM | POA: Diagnosis not present

## 2024-10-07 DIAGNOSIS — M545 Low back pain, unspecified: Secondary | ICD-10-CM | POA: Diagnosis not present

## 2024-10-08 DIAGNOSIS — R2681 Unsteadiness on feet: Secondary | ICD-10-CM | POA: Diagnosis not present

## 2024-10-08 DIAGNOSIS — R41841 Cognitive communication deficit: Secondary | ICD-10-CM | POA: Diagnosis not present

## 2024-10-08 DIAGNOSIS — G301 Alzheimer's disease with late onset: Secondary | ICD-10-CM | POA: Diagnosis not present

## 2024-10-12 DIAGNOSIS — R2681 Unsteadiness on feet: Secondary | ICD-10-CM | POA: Diagnosis not present

## 2024-10-12 DIAGNOSIS — M4854XA Collapsed vertebra, not elsewhere classified, thoracic region, initial encounter for fracture: Secondary | ICD-10-CM | POA: Diagnosis not present

## 2024-10-12 DIAGNOSIS — M545 Low back pain, unspecified: Secondary | ICD-10-CM | POA: Diagnosis not present

## 2024-10-12 DIAGNOSIS — M810 Age-related osteoporosis without current pathological fracture: Secondary | ICD-10-CM | POA: Diagnosis not present

## 2024-10-19 ENCOUNTER — Telehealth: Payer: Self-pay

## 2024-10-19 ENCOUNTER — Encounter: Payer: Self-pay | Admitting: Internal Medicine

## 2024-10-19 ENCOUNTER — Non-Acute Institutional Stay: Payer: Self-pay | Admitting: Internal Medicine

## 2024-10-19 VITALS — BP 130/70 | HR 56 | Temp 97.9°F | Ht 60.0 in | Wt 116.2 lb

## 2024-10-19 DIAGNOSIS — F411 Generalized anxiety disorder: Secondary | ICD-10-CM

## 2024-10-19 DIAGNOSIS — R0989 Other specified symptoms and signs involving the circulatory and respiratory systems: Secondary | ICD-10-CM

## 2024-10-19 DIAGNOSIS — E559 Vitamin D deficiency, unspecified: Secondary | ICD-10-CM | POA: Diagnosis not present

## 2024-10-19 DIAGNOSIS — M810 Age-related osteoporosis without current pathological fracture: Secondary | ICD-10-CM

## 2024-10-19 DIAGNOSIS — R609 Edema, unspecified: Secondary | ICD-10-CM | POA: Diagnosis not present

## 2024-10-19 DIAGNOSIS — N3281 Overactive bladder: Secondary | ICD-10-CM | POA: Diagnosis not present

## 2024-10-19 DIAGNOSIS — E785 Hyperlipidemia, unspecified: Secondary | ICD-10-CM | POA: Diagnosis not present

## 2024-10-19 MED ORDER — LORAZEPAM 0.5 MG PO TABS: 0.5000 mg | ORAL_TABLET | Freq: Two times a day (BID) | ORAL | 0 refills | Status: DC | PRN

## 2024-10-19 MED ORDER — FUROSEMIDE 20 MG PO TABS: ORAL_TABLET | ORAL | Status: DC

## 2024-10-19 MED ORDER — ESCITALOPRAM OXALATE 5 MG PO TABS: 5.0000 mg | ORAL_TABLET | Freq: Every day | ORAL | 3 refills | Status: DC

## 2024-10-19 MED ORDER — GEMTESA 75 MG PO TABS: 75.0000 mg | ORAL_TABLET | Freq: Every day | ORAL | 3 refills | Status: AC

## 2024-10-19 NOTE — Telephone Encounter (Signed)
 Incoming fax received from patients pharmacy to initiate a prior authorization for                   .  PA initiated through covermymeds. Key: [id number]  Awaiting reply from the insurance company which will be determined in 48-72 hours.

## 2024-10-19 NOTE — Progress Notes (Unsigned)
 Location: [facility] of Service:  Clinic (12)  Provider:   Code Status:  Goals of Care:     08/06/2024   12:30 PM  Advanced Directives  Does Patient Have a Medical Advance Directive? Yes     Chief Complaint  Patient presents with   Follow-up    8 week follow up Patient would like refill on vibegron     HPI: Patient is a 83 y.o. female seen today for an acute visit for Follow up  Lives in [place] in [place]  Labile BP Sees Cardiology She experiences significant fluctuations in blood pressure, with occasional readings exceeding 200 mmHg and typically high morning readings around 180-190 mmHg She feels jittery with high blood pressure and lethargic with low blood pressure .  She has a history of a broken elbow with surgery, resulting in limited motion and occasional pa   Osteoporosis on Prolia  History of constipation maintained with Colace and MiraLAX   Lower extremity edema  Chronic back pain  And anxiety  Discussed the use of AI scribe software for clinical note transcription with the patient, who gave verbal consent to proceed.  History of Present Illness   [name] is an 83 year old female with anxiety and hypertension who presents for medication management and follow-up.  She reports worsening anxiety related to several recent deaths of acquaintances, with distress and lack of social support. She takes lorazepam  0.5 mg once daily, which she finds helpful for anxiety and believes also lowers her blood pressure. She is considering seeing a psychologist.  She has episodic elevated blood pressure. She was prescribed amlodipine  but avoids taking it at night because she feels her readings are already too high, and instead relies on lorazepam  to control both anxiety and blood pressure.  She has intermittent leg swelling. She takes Lasix  as needed, with good short-term effect, but the swelling recurs.  She has urinary incontinence and  was prescribed Gemtesa . She has not taken it regularly due to running out and recently wet the bed. She reports Gemtesa  provided some benefit when she used it.  She has constipation that improves with Colace. She notes that eating a chocolate bar triggers diarrhea.  She has chronic back pain and takes Advil  in the afternoon with good relief, usually with olmesartan  and food. She asks about quick meal options so she does not take Advil  on an empty stomach.       Past Medical History:  Diagnosis Date   Abdominal pain, unspecified site    ADVERSE REACTION TO MEDICATION 07/31/2009   ANEMIA-NOS 07/07/2007   Anxiety    Arthritis    Chest pain, unspecified    Chronic kidney disease    hx of kidney stone   COLONIC POLYPS, ADENOMATOUS, HX OF    Complication of anesthesia    Dementia (HCC)    Disturbance of skin sensation    DIVERTICULOSIS, COLON 02/23/2009   Dyspnea    Family history of diabetes mellitus    Family history of malignant neoplasm of breast    FATIGUE 05/25/2009   Fever, unspecified    Heart murmur    hx of   HYPERLIPIDEMIA 07/07/2007   HYPERPARATHYROIDISM UNSPECIFIED 10/20/2007   HYPERTENSION 02/04/2008   Incomplete right bundle branch block (RBBB) determined by electrocardiography 04/15/2023   Irritable bowel syndrome (IBS)    NEPHROLITHIASIS, HX OF 11/15/2008   OSTEOPOROSIS 01/01/2008   Other acquired absence of organ    parathyroidectomy   Pain in  limb    PALPITATIONS, OCCASIONAL 07/12/2008   PARATHYROIDECTOMY 01/02/2009   PONV (postoperative nausea and vomiting)    SCOLIOSIS 05/25/2009   Seasonal allergies    Unspecified constipation    URINARY INCONTINENCE 07/07/2007   UTI'S, RECURRENT 11/20/2009   kimbrough     Past Surgical History:  Procedure Laterality Date   BUNIONECTOMY     CATARACT EXTRACTION, BILATERAL     COLONOSCOPY     EXTRACORPOREAL SHOCK WAVE LITHOTRIPSY Left 12/21/2020   Procedure: EXTRACORPOREAL SHOCK WAVE LITHOTRIPSY (ESWL);  Surgeon:  Nieves Cough, MD;  Location: [facility];  Service: Urology;  Laterality: Left;   LAPAROSCOPIC APPENDECTOMY  01/19/2016   Procedure: APPENDECTOMY LAPAROSCOPIC with orifice of cecal polyp;  Surgeon: Bernarda Ned, MD;  Location: [facility];  Service: General;;   LEFT HEART CATH AND CORONARY ANGIOGRAPHY N/A 04/07/2017   Procedure: Left Heart Cath and Coronary Angiography;  Surgeon: Mady Bruckner, MD;  Location: [facility];  Service: Cardiovascular;  Laterality: N/A;   OLECRANON BURSECTOMY Left 10/21/2023   Procedure: OLECRANON (ELBOW) BURSA;  Surgeon: Beverley Evalene BIRCH, MD;  Location: [facility];  Service: Orthopedics;  Laterality: Left;   ORIF ELBOW FRACTURE Left 10/21/2023   Procedure: Open Reduction Internal fixation (ORIF), fracture, elbow/olecranon;  Surgeon: Beverley Evalene BIRCH, MD;  Location: [facility];  Service: Orthopedics;  Laterality: Left;   PARATHYROIDECTOMY     Rt Superior open neck exploration   POLYPECTOMY     RIGHT OOPHORECTOMY     TONSILLECTOMY      Allergies  Allergen Reactions   Anesthetics, Amide Other (See Comments)    Pt states she had hallucinations and HTN after procedure 01/19/16-she feels may have been reaction to anesthetic   Lisinopril Cough    Outpatient Encounter Medications as of 10/19/2024  Medication Sig   amLODipine  (NORVASC ) 2.5 MG tablet Take one tablet in the evening if blood pressure more than 160.   b complex vitamins capsule Take 1 capsule by mouth daily.   bisacodyl  5 MG EC tablet Take 1 tablet (5 mg total) by mouth as directed. As instructed for bowel purge   Cholecalciferol (VITAMIN D3) 125 MCG (5000 UT) CAPS Take 1 capsule (5,000 Units total) by mouth daily.   denosumab  (PROLIA ) 60 MG/ML SOSY injection Inject 60 mg into the skin every 6 (six) months.   docusate sodium  (COLACE) 100 MG capsule Take 1 capsule (100 mg total) by mouth 2 (two) times daily.   escitalopram  (LEXAPRO ) 5 MG tablet Take 1 tablet (5 mg total) by mouth daily.    Ibuprofen  (ADVIL  PO) Take 400 mg by mouth every 8 (eight) hours as needed (pain).   loperamide  (IMODIUM  A-D) 2 MG tablet Take 1 tablet (2 mg total) by mouth daily as needed for diarrhea or loose stools.   olmesartan  (BENICAR ) 20 MG tablet Take as directed by cardiology. (If morning BP <110, no Olmesartan . If morning BP 110-160, half tablet Olmesartan . If morning BP >160, whole tablet Olmesartan . If EVENING BP <110, no medication. If EVENING BP >110, take one tablet Olmesartan )   polyethylene glycol (MIRALAX  / GLYCOLAX ) 17 g packet Take 17 g by mouth daily as needed for mild constipation.   [DISCONTINUED] LORazepam  (ATIVAN ) 0.5 MG tablet Take 1 tablet (0.5 mg total) by mouth 2 (two) times daily as needed for anxiety. for anxiety   [DISCONTINUED] Vibegron  (GEMTESA  PO) Take by mouth. Urinary incontinence   furosemide  (LASIX ) 20 MG tablet Take 1 tablet (20 mg total) by mouth once per  week AS NEEDED for leg swelling.   LORazepam  (ATIVAN ) 0.5 MG tablet Take 1 tablet (0.5 mg total) by mouth 2 (two) times daily as needed for anxiety. for anxiety   Vibegron  (GEMTESA ) 75 MG TABS Take 1 tablet (75 mg total) by mouth daily. Urinary incontinence   [DISCONTINUED] furosemide  (LASIX ) 20 MG tablet Take 1 tablet (20 mg total) by mouth once per week AS NEEDED for leg swelling. (Patient not taking: Reported on 10/19/2024)   No facility-administered encounter medications on file as of 10/19/2024.    Review of Systems:  Review of Systems  Constitutional:  Negative for activity change and appetite change.  HENT: Negative.    Respiratory:  Negative for cough and shortness of breath.   Cardiovascular:  Negative for leg swelling.  Gastrointestinal:  Positive for constipation.  Genitourinary:  Positive for frequency and urgency.  Musculoskeletal:  Positive for back pain and gait problem. Negative for arthralgias and myalgias.  Skin: Negative.   Neurological:  Positive for weakness. Negative for dizziness.   Psychiatric/Behavioral:  Negative for confusion, dysphoric mood and sleep disturbance. The patient is nervous/anxious.     Health Maintenance  Topic Date Due   DTaP/Tdap/Td (2 - Td or Tdap) 01/05/2025   COVID-19 Vaccine (7 - Moderna risk 2025-26 season) 02/14/2025   Medicare Annual Wellness (AWV)  03/17/2025   Pneumococcal Vaccine: 50+ Years  Completed   Influenza Vaccine  Completed   Bone Density Scan  Completed   Zoster Vaccines- Shingrix  Completed   Meningococcal B Vaccine  Aged Out   Colonoscopy  Discontinued    Physical Exam: Vitals:   10/19/24 1406  BP: 130/70  Pulse: (!) 56  Temp: 97.9 F (36.6 C)  SpO2: 98%  Weight: 116 lb 3.2 oz (52.7 kg)  Height: 5' (1.524 m)   Body mass index is 22.69 kg/m. Physical Exam Vitals reviewed.  HENT:     Head: Normocephalic.     Nose: Nose normal.     Mouth/Throat:     Mouth: Mucous membranes are moist.  Musculoskeletal:        General: Swelling present.  Skin:    General: Skin is warm.  Neurological:     General: No focal deficit present.     Mental Status: She is alert and oriented to person, place, and time.  Psychiatric:        Mood and Affect: Mood normal.        Thought Content: Thought content normal.     Labs reviewed: Basic Metabolic Panel: Recent Labs    12/23/23 1612 06/28/24 1607 08/06/24 1315  NA 140 141 139  K 4.1 4.7 4.2  CL 105 105 105  CO2 27 18* 25  GLUCOSE 99 90 105*  BUN 19 21 24*  CREATININE 0.77 0.90 0.81  CALCIUM  10.3 10.3 9.8  TSH 1.26  --   --    Liver Function Tests: Recent Labs    12/23/23 1612 06/28/24 1607 08/06/24 1315  AST 18 25 20   ALT 15 13 17   ALKPHOS 64 63 47  BILITOT 0.7 0.9 1.3*  PROT 6.5 6.8 6.5  ALBUMIN 4.1 4.5 3.7   No results for input(s): LIPASE, AMYLASE in the last 8760 hours. No results for input(s): AMMONIA in the last 8760 hours. CBC: Recent Labs    12/23/23 1612 06/28/24 1607 08/06/24 1315  WBC 6.2 5.7 6.2  NEUTROABS 3.7  --  4.3  HGB  13.6 13.8 14.3  HCT 41.0 41.4 43.7  MCV 97.4  96 96.3  PLT 217.0 177 206   Lipid Panel: No results for input(s): CHOL, HDL, LDLCALC, TRIG, CHOLHDL, LDLDIRECT in the last 8760 hours. No results found for: HGBA1C  Procedures since last visit: No results found.  Assessment/Plan   Assessment and Plan    Anxiety and Depressive Disorders Chronic anxiety controlled with lorazepam . Discussed adding Lexapro  for enhanced management. She agreed to try Lexapro  despite concerns about lorazepam  discontinuation. - Continue lorazepam  0.5 mg daily. - Started Lexapro  in the morning. Check BMP   Hypertension Blood pressure variably controlled with olmesartan , elevated during anxiety episodes. Lorazepam  aids in managing anxiety-related spikes.  Lower Extremity Edema Intermittent edema managed with furosemide , which improves swelling. - Continue furosemide  as needed for edema. - Monitor for hypotension when using furosemide .  Urinary Incontinence Partial response to Gemtesa  previously. She reports bedwetting and is willing to retry Gemtesa . - Reordered Gemtesa  for urinary incontinence. - Monitor response to Gemtesa  and adjust treatment as needed.  Constipation Chronic constipation improved with Colace. Occasional use of chocolate noted. - Continue Colace as needed. - Advised against using Advil  on an empty stomach.  Osteoporosis Managed with Prolia  injections. With Dr Marce.  Chronic Pain Managed with Advil , advised against taking on an empty stomach due to gastrointestinal risks. - Continue Advil  as needed, ensuring it is not taken on an empty stomach.  General Health Maintenance Up to date with COVID and pneumonia vaccinations. Discussed DTaP coverage, next dose due next year. - Ensure DTaP vaccination is administered next year.        Labs/tests ordered:  CMP,lipid,TSH,Free t4 B 12  Next appt:  10/19/2024

## 2024-10-20 NOTE — Telephone Encounter (Signed)
 Received approval notification from [facility], see media    I called patients pharmacy and left a detailed voicemail notifying them of prior authorization approval.

## 2024-11-02 LAB — BASIC METABOLIC PANEL WITH GFR
BUN: 19 (ref 4–21)
CO2: 24 — AB (ref 13–22)
Chloride: 108 (ref 99–108)
Creatinine: 0.7 (ref 0.5–1.1)
Glucose: 90
Potassium: 4 meq/L (ref 3.5–5.1)
Sodium: 140 (ref 137–147)

## 2024-11-02 LAB — VITAMIN D 25 HYDROXY (VIT D DEFICIENCY, FRACTURES): Vit D, 25-Hydroxy: 65.8

## 2024-11-02 LAB — COMPREHENSIVE METABOLIC PANEL WITH GFR
Albumin: 4 (ref 3.5–5.0)
Calcium: 8.9 (ref 8.7–10.7)
Globulin: 2.1
eGFR: 80

## 2024-11-02 LAB — HEPATIC FUNCTION PANEL
ALT: 15 U/L (ref 7–35)
AST: 19 (ref 13–35)
Alkaline Phosphatase: 64 (ref 25–125)
Bilirubin, Total: 0.8

## 2024-11-02 LAB — TSH: TSH: 2.61 (ref 0.41–5.90)

## 2024-11-02 LAB — VITAMIN B12: Vitamin B-12: 708

## 2024-11-23 ENCOUNTER — Encounter: Payer: Self-pay | Admitting: Internal Medicine

## 2024-11-23 ENCOUNTER — Non-Acute Institutional Stay: Admitting: Internal Medicine

## 2024-11-23 VITALS — BP 106/64 | HR 62 | Temp 98.4°F | Ht 60.0 in | Wt 116.4 lb

## 2024-11-23 DIAGNOSIS — N3281 Overactive bladder: Secondary | ICD-10-CM | POA: Diagnosis not present

## 2024-11-23 DIAGNOSIS — F411 Generalized anxiety disorder: Secondary | ICD-10-CM | POA: Diagnosis not present

## 2024-11-23 DIAGNOSIS — R0989 Other specified symptoms and signs involving the circulatory and respiratory systems: Secondary | ICD-10-CM

## 2024-11-23 DIAGNOSIS — M810 Age-related osteoporosis without current pathological fracture: Secondary | ICD-10-CM | POA: Diagnosis not present

## 2024-11-23 DIAGNOSIS — K5901 Slow transit constipation: Secondary | ICD-10-CM

## 2024-11-23 DIAGNOSIS — I10 Essential (primary) hypertension: Secondary | ICD-10-CM

## 2024-11-24 NOTE — Progress Notes (Signed)
 "  Location: [facility] of Service:  Clinic (12)  Provider:   Code Status:  Goals of Care:     08/06/2024   12:30 PM  Advanced Directives  Does Patient Have a Medical Advance Directive? Yes     Chief Complaint  Patient presents with   Follow-up    Patient has concerns about her gait. Patient concerns about not having any energy. Patient has concerns about sleeping all time. Patient states her legs hurt.    HPI: Patient is a 84 y.o. female seen today for an acute visit for Follow up of her BP and Anxiety   [place]   Labile BP Sees Cardiology She experiences significant fluctuations in blood pressure, with occasional readings exceeding 200 mmHg and typically high morning readings around 180-190 mmHg She feels jittery with high blood pressure and lethargic with low blood pressure  Discussed the use of AI scribe software for clinical note transcription with the patient, who gave verbal consent to proceed.  History of Present Illness   [name] is an 84 year old female with hypertension and anxiety who presents with fluctuating blood pressure and nervousness. Labile BP She has large blood pressure fluctuations, with morning readings usually below 200 mmHg but sometimes above 200 mmHg. Today her blood pressure was stable earlier but later dropped to 100 mmHg, at which point she felt sleepy. She takes losartan  each morning but has not taken amlodipine  for months.   She sometimes uses lorazepam  and believes it lowers her blood pressure.  Anxiety She has significant nervousness and uses lorazepam  as needed, usually one dose in the morning and an extra dose in the afternoon if she plans to go out. She feels sleepy and questions whether this is related to lorazepam .  She was prescribed Lexapro  but only took one dose because she thought it was meant to replace lorazepam .  Urinary Incontinence She reports nightly bed wetness  despite Gemtesa . She is unsure if this is from urine or sweating, as the diaper is not soaked but her clothes and bed are wet.  COnstipation She has constipation with bowel movements about once a week that are hard to pass and sometimes followed by diarrhea. She takes docusate once daily instead of the prescribed twice daily. She has senna at home but rarely uses it. Prune juice helps but can cause loss of control if she drinks too much.  She feels unusually sleepy and now sleeps from about 7 PM to 10 AM, compared with her prior pattern of 11 PM to 8 AM.      Past Medical History:  Diagnosis Date   Abdominal pain, unspecified site    ADVERSE REACTION TO MEDICATION 07/31/2009   ANEMIA-NOS 07/07/2007   Anxiety    Arthritis    Chest pain, unspecified    Chronic kidney disease    hx of kidney stone   COLONIC POLYPS, ADENOMATOUS, HX OF    Complication of anesthesia    Dementia (HCC)    Disturbance of skin sensation    DIVERTICULOSIS, COLON 02/23/2009   Dyspnea    Family history of diabetes mellitus    Family history of malignant neoplasm of breast    FATIGUE 05/25/2009   Fever, unspecified    Heart murmur    hx of   HYPERLIPIDEMIA 07/07/2007   HYPERPARATHYROIDISM UNSPECIFIED 10/20/2007   HYPERTENSION 02/04/2008   Incomplete right bundle branch block (RBBB) determined by electrocardiography 04/15/2023  Irritable bowel syndrome (IBS)    NEPHROLITHIASIS, HX OF 11/15/2008   OSTEOPOROSIS 01/01/2008   Other acquired absence of organ    parathyroidectomy   Pain in limb    PALPITATIONS, OCCASIONAL 07/12/2008   PARATHYROIDECTOMY 01/02/2009   PONV (postoperative nausea and vomiting)    SCOLIOSIS 05/25/2009   Seasonal allergies    Unspecified constipation    URINARY INCONTINENCE 07/07/2007   UTI'S, RECURRENT 11/20/2009   kimbrough     Past Surgical History:  Procedure Laterality Date   BUNIONECTOMY     CATARACT EXTRACTION, BILATERAL     COLONOSCOPY     EXTRACORPOREAL SHOCK WAVE  LITHOTRIPSY Left 12/21/2020   Procedure: EXTRACORPOREAL SHOCK WAVE LITHOTRIPSY (ESWL);  Surgeon: Nieves Cough, MD;  Location: [facility];  Service: Urology;  Laterality: Left;   LAPAROSCOPIC APPENDECTOMY  01/19/2016   Procedure: APPENDECTOMY LAPAROSCOPIC with orifice of cecal polyp;  Surgeon: Bernarda Ned, MD;  Location: [facility];  Service: General;;   LEFT HEART CATH AND CORONARY ANGIOGRAPHY N/A 04/07/2017   Procedure: Left Heart Cath and Coronary Angiography;  Surgeon: Mady Bruckner, MD;  Location: [facility];  Service: Cardiovascular;  Laterality: N/A;   OLECRANON BURSECTOMY Left 10/21/2023   Procedure: OLECRANON (ELBOW) BURSA;  Surgeon: Beverley Evalene BIRCH, MD;  Location: [facility];  Service: Orthopedics;  Laterality: Left;   ORIF ELBOW FRACTURE Left 10/21/2023   Procedure: Open Reduction Internal fixation (ORIF), fracture, elbow/olecranon;  Surgeon: Beverley Evalene BIRCH, MD;  Location: [facility];  Service: Orthopedics;  Laterality: Left;   PARATHYROIDECTOMY     Rt Superior open neck exploration   POLYPECTOMY     RIGHT OOPHORECTOMY     TONSILLECTOMY      Allergies[1]  Outpatient Encounter Medications as of 11/23/2024  Medication Sig   amLODipine  (NORVASC ) 2.5 MG tablet Take one tablet in the evening if blood pressure more than 160.   b complex vitamins capsule Take 1 capsule by mouth daily.   bisacodyl  5 MG EC tablet Take 1 tablet (5 mg total) by mouth as directed. As instructed for bowel purge   Cholecalciferol (VITAMIN D3) 125 MCG (5000 UT) CAPS Take 1 capsule (5,000 Units total) by mouth daily.   denosumab  (PROLIA ) 60 MG/ML SOSY injection Inject 60 mg into the skin every 6 (six) months.   docusate sodium  (COLACE) 100 MG capsule Take 1 capsule (100 mg total) by mouth 2 (two) times daily.   escitalopram  (LEXAPRO ) 5 MG tablet Take 1 tablet (5 mg total) by mouth daily.   furosemide  (LASIX ) 20 MG tablet Take 1 tablet (20 mg total) by mouth once per week AS NEEDED for leg  swelling.   Ibuprofen  (ADVIL  PO) Take 400 mg by mouth every 8 (eight) hours as needed (pain).   loperamide  (IMODIUM  A-D) 2 MG tablet Take 1 tablet (2 mg total) by mouth daily as needed for diarrhea or loose stools.   LORazepam  (ATIVAN ) 0.5 MG tablet Take 1 tablet (0.5 mg total) by mouth 2 (two) times daily as needed for anxiety. for anxiety   olmesartan  (BENICAR ) 20 MG tablet Take as directed by cardiology. (If morning BP <110, no Olmesartan . If morning BP 110-160, half tablet Olmesartan . If morning BP >160, whole tablet Olmesartan . If EVENING BP <110, no medication. If EVENING BP >110, take one tablet Olmesartan )   polyethylene glycol (MIRALAX  / GLYCOLAX ) 17 g packet Take 17 g by mouth daily as needed for mild constipation.   Vibegron  (GEMTESA ) 75 MG TABS Take 1 tablet (75 mg  total) by mouth daily. Urinary incontinence   No facility-administered encounter medications on file as of 11/23/2024.    Review of Systems:  Review of Systems  Constitutional:  Positive for activity change. Negative for appetite change.  HENT: Negative.    Respiratory:  Negative for cough and shortness of breath.   Cardiovascular:  Negative for leg swelling.  Gastrointestinal:  Positive for constipation.  Genitourinary: Negative.   Musculoskeletal:  Positive for gait problem. Negative for arthralgias and myalgias.  Skin: Negative.   Neurological:  Positive for weakness. Negative for dizziness.  Psychiatric/Behavioral:  Negative for confusion, dysphoric mood and sleep disturbance.     Health Maintenance  Topic Date Due   DTaP/Tdap/Td (2 - Td or Tdap) 01/05/2025   COVID-19 Vaccine (7 - Moderna risk 2025-26 season) 02/14/2025   Medicare Annual Wellness (AWV)  03/17/2025   Pneumococcal Vaccine: 50+ Years  Completed   Influenza Vaccine  Completed   Bone Density Scan  Completed   Zoster Vaccines- Shingrix  Completed   Meningococcal B Vaccine  Aged Out   Colonoscopy  Discontinued    Physical Exam: Vitals:    11/23/24 1532  BP: 106/64  Pulse: 62  Temp: 98.4 F (36.9 C)  SpO2: 94%  Weight: 116 lb 6.4 oz (52.8 kg)  Height: 5' (1.524 m)   Body mass index is 22.73 kg/m. Physical Exam Vitals reviewed.  Constitutional:      Appearance: Normal appearance.  HENT:     Head: Normocephalic.     Nose: Nose normal.     Mouth/Throat:     Mouth: Mucous membranes are moist.     Pharynx: Oropharynx is clear.  Eyes:     Pupils: Pupils are equal, round, and reactive to light.  Cardiovascular:     Rate and Rhythm: Normal rate and regular rhythm.     Pulses: Normal pulses.     Heart sounds: Normal heart sounds. No murmur heard. Pulmonary:     Effort: Pulmonary effort is normal.     Breath sounds: Normal breath sounds.  Abdominal:     General: Abdomen is flat. Bowel sounds are normal.     Palpations: Abdomen is soft.  Musculoskeletal:        General: No swelling.     Cervical back: Neck supple.  Skin:    General: Skin is warm.  Neurological:     General: No focal deficit present.     Mental Status: She is alert and oriented to person, place, and time.  Psychiatric:        Mood and Affect: Mood normal.        Thought Content: Thought content normal.     Labs reviewed: Basic Metabolic Panel: Recent Labs    12/23/23 1612 06/28/24 1607 08/06/24 1315 11/02/24 0744 11/02/24 1025  NA 140 141 139  --  140  K 4.1 4.7 4.2  --  4.0  CL 105 105 105  --  108  CO2 27 18* 25  --  24*  GLUCOSE 99 90 105*  --   --   BUN 19 21 24*  --  19  CREATININE 0.77 0.90 0.81  --  0.7  CALCIUM  10.3 10.3 9.8 8.9  --   TSH 1.26  --   --   --  2.61   Liver Function Tests: Recent Labs    12/23/23 1612 06/28/24 1607 08/06/24 1315 11/02/24 0744 11/02/24 1025  AST 18 25 20   --  19  ALT 15 13 17   --  15  ALKPHOS 64 63 47  --  64  BILITOT 0.7 0.9 1.3*  --   --   PROT 6.5 6.8 6.5  --   --   ALBUMIN 4.1 4.5 3.7 4.0  --    No results for input(s): LIPASE, AMYLASE in the last 8760 hours. No results  for input(s): AMMONIA in the last 8760 hours. CBC: Recent Labs    12/23/23 1612 06/28/24 1607 08/06/24 1315  WBC 6.2 5.7 6.2  NEUTROABS 3.7  --  4.3  HGB 13.6 13.8 14.3  HCT 41.0 41.4 43.7  MCV 97.4 96 96.3  PLT 217.0 177 206   Lipid Panel: No results for input(s): CHOL, HDL, LDLCALC, TRIG, CHOLHDL, LDLDIRECT in the last 8760 hours. No results found for: HGBA1C  Procedures since last visit: No results found.  Assessment/Plan  Assessment and Plan    Generalized anxiety disorder Never took her Lexapro  as prescribed. Her repeat BMP was stable but patient never took the medication Really emphasized that patient needs to restart Maxipro to help with her anxiety and depression Labile blood pressure Continues to have labile blood pressure ranging from 198-110. Follows with cardiology if BP <110 mmHg, no meds; 110-160 mmHg, amlodipine ; >160 mmHg, amlodipine  and olmesartan .  Patient says that she has not used amlodipine  and uses more lorazepam  which she thinks helps with the blood pressure  Constipation Constipation with infrequent bowel movements, docusate ineffective. Alternating constipation and diarrhea suggests possible impaction. - Add Senna to regimen. - Consider prune juice as adjunct therapy. - Educated on regular bowel movements and use of Senna and prune juice.  Urinary incontinence Continue Gemtesa  for now  Chronic back pain Uses Advil  side effects have been discussed Osteoporosis on Prolia  her Dr. Helena [facility]ogram at [facility]  LE edema Uses Lasix  PRn   Labs/tests ordered:   Next appt:  12/21/2024     [1]  Allergies Allergen Reactions   Anesthetics, Amide Other (See Comments)    Pt states she had hallucinations and HTN after procedure 01/19/16-she feels may have been reaction to anesthetic   Lisinopril Cough   "

## 2024-12-02 ENCOUNTER — Ambulatory Visit: Admitting: Physical Therapy

## 2024-12-04 ENCOUNTER — Other Ambulatory Visit: Payer: Self-pay | Admitting: Internal Medicine

## 2024-12-06 NOTE — Telephone Encounter (Signed)
 Pharmacy requested refill.  Epic LR: 10/19/2024 No Contract on file. Added note to upcoming appointment.   Pended Rx and sent to Dr. Charlanne for approval.

## 2024-12-16 ENCOUNTER — Telehealth: Payer: Self-pay

## 2024-12-16 NOTE — Telephone Encounter (Signed)
 Spoke with patient and patient states she is taking miralax  once daily, bisacodyl  5 mg daily for constipation. Patient denies taking colace. Patient with little episodes of diarrhea in the morning x 3 days and then converts back to constipation. Patient also with a swollen mouth x a couple of days and questions if that is a side effect of her medications

## 2024-12-16 NOTE — Telephone Encounter (Signed)
 D/W the Facility Nurse and they are helping her with this.

## 2024-12-16 NOTE — Telephone Encounter (Signed)
 Copied from CRM #[id number]. Topic: General - Other >> Dec 16, 2024 12:56 PM Miquel SAILOR wrote: Reason for CRM:  Merlynn friend [phone]: Stated PT has been constipated for 2 weeks. Needs call back on issue. [phone]

## 2024-12-21 ENCOUNTER — Non-Acute Institutional Stay: Admitting: Internal Medicine

## 2024-12-21 ENCOUNTER — Encounter: Payer: Self-pay | Admitting: Internal Medicine

## 2024-12-21 VITALS — BP 154/80 | HR 62 | Temp 97.9°F | Ht 60.0 in | Wt 114.2 lb

## 2024-12-21 DIAGNOSIS — M810 Age-related osteoporosis without current pathological fracture: Secondary | ICD-10-CM

## 2024-12-21 DIAGNOSIS — R0989 Other specified symptoms and signs involving the circulatory and respiratory systems: Secondary | ICD-10-CM | POA: Diagnosis not present

## 2024-12-21 DIAGNOSIS — K5901 Slow transit constipation: Secondary | ICD-10-CM

## 2024-12-21 DIAGNOSIS — N3281 Overactive bladder: Secondary | ICD-10-CM

## 2024-12-21 DIAGNOSIS — F411 Generalized anxiety disorder: Secondary | ICD-10-CM | POA: Diagnosis not present

## 2024-12-21 MED ORDER — FUROSEMIDE 20 MG PO TABS: ORAL_TABLET | ORAL | Status: AC

## 2024-12-21 MED ORDER — ESCITALOPRAM OXALATE 10 MG PO TABS: 10.0000 mg | ORAL_TABLET | Freq: Every day | ORAL | 3 refills | Status: AC

## 2024-12-21 NOTE — Patient Instructions (Addendum)
 Changing the Escitalopram  to 10 mg Can take two 5 mg till you run out of it Can take Lorazepam  twice a day as needed Restart Gemtesa   Also restart Linzess  for constipation.

## 2024-12-23 ENCOUNTER — Ambulatory Visit: Admitting: Physical Therapy

## 2025-01-04 ENCOUNTER — Ambulatory Visit (HOSPITAL_BASED_OUTPATIENT_CLINIC_OR_DEPARTMENT_OTHER): Admitting: Cardiology

## 2025-01-18 ENCOUNTER — Encounter: Admitting: Internal Medicine

## 2025-03-22 ENCOUNTER — Ambulatory Visit
